# Patient Record
Sex: Male | Born: 1970 | Race: Asian | Hispanic: No | Marital: Single | Smoking: Current some day smoker
Health system: Southern US, Community
[De-identification: ages and names within clinical notes are randomized; demographics above are authoritative.]

## PROBLEM LIST (undated history)

## (undated) DIAGNOSIS — N189 Chronic kidney disease, unspecified: Secondary | ICD-10-CM

## (undated) DIAGNOSIS — D649 Anemia, unspecified: Secondary | ICD-10-CM

## (undated) DIAGNOSIS — I10 Essential (primary) hypertension: Secondary | ICD-10-CM

## (undated) DIAGNOSIS — N289 Disorder of kidney and ureter, unspecified: Secondary | ICD-10-CM

## (undated) DIAGNOSIS — E119 Type 2 diabetes mellitus without complications: Secondary | ICD-10-CM

---

## 2019-05-04 DIAGNOSIS — D3102 Benign neoplasm of left conjunctiva: Secondary | ICD-10-CM | POA: Diagnosis not present

## 2019-05-04 DIAGNOSIS — H2521 Age-related cataract, morgagnian type, right eye: Secondary | ICD-10-CM | POA: Diagnosis not present

## 2019-05-04 DIAGNOSIS — H524 Presbyopia: Secondary | ICD-10-CM | POA: Diagnosis not present

## 2019-05-04 DIAGNOSIS — H2513 Age-related nuclear cataract, bilateral: Secondary | ICD-10-CM | POA: Diagnosis not present

## 2019-05-04 DIAGNOSIS — E113413 Type 2 diabetes mellitus with severe nonproliferative diabetic retinopathy with macular edema, bilateral: Secondary | ICD-10-CM | POA: Diagnosis not present

## 2019-05-17 DIAGNOSIS — E113312 Type 2 diabetes mellitus with moderate nonproliferative diabetic retinopathy with macular edema, left eye: Secondary | ICD-10-CM | POA: Diagnosis not present

## 2019-05-17 DIAGNOSIS — H26131 Total traumatic cataract, right eye: Secondary | ICD-10-CM | POA: Diagnosis not present

## 2019-05-17 DIAGNOSIS — H3582 Retinal ischemia: Secondary | ICD-10-CM | POA: Diagnosis not present

## 2019-05-17 DIAGNOSIS — H26221 Cataract secondary to ocular disorders (degenerative) (inflammatory), right eye: Secondary | ICD-10-CM | POA: Diagnosis not present

## 2019-05-22 DIAGNOSIS — Z1331 Encounter for screening for depression: Secondary | ICD-10-CM | POA: Diagnosis not present

## 2019-05-22 DIAGNOSIS — I10 Essential (primary) hypertension: Secondary | ICD-10-CM | POA: Diagnosis not present

## 2019-05-22 DIAGNOSIS — E119 Type 2 diabetes mellitus without complications: Secondary | ICD-10-CM | POA: Diagnosis not present

## 2019-05-22 DIAGNOSIS — H269 Unspecified cataract: Secondary | ICD-10-CM | POA: Diagnosis not present

## 2019-06-06 DIAGNOSIS — I10 Essential (primary) hypertension: Secondary | ICD-10-CM | POA: Diagnosis not present

## 2019-06-06 DIAGNOSIS — E113312 Type 2 diabetes mellitus with moderate nonproliferative diabetic retinopathy with macular edema, left eye: Secondary | ICD-10-CM | POA: Diagnosis not present

## 2019-06-06 DIAGNOSIS — E119 Type 2 diabetes mellitus without complications: Secondary | ICD-10-CM | POA: Diagnosis not present

## 2019-06-25 DIAGNOSIS — R808 Other proteinuria: Secondary | ICD-10-CM | POA: Diagnosis not present

## 2019-06-25 DIAGNOSIS — E119 Type 2 diabetes mellitus without complications: Secondary | ICD-10-CM | POA: Diagnosis not present

## 2019-06-25 DIAGNOSIS — E611 Iron deficiency: Secondary | ICD-10-CM | POA: Diagnosis not present

## 2019-06-25 DIAGNOSIS — I10 Essential (primary) hypertension: Secondary | ICD-10-CM | POA: Diagnosis not present

## 2019-06-25 DIAGNOSIS — E559 Vitamin D deficiency, unspecified: Secondary | ICD-10-CM | POA: Diagnosis not present

## 2019-06-25 DIAGNOSIS — R944 Abnormal results of kidney function studies: Secondary | ICD-10-CM | POA: Diagnosis not present

## 2019-06-29 DIAGNOSIS — R944 Abnormal results of kidney function studies: Secondary | ICD-10-CM | POA: Diagnosis not present

## 2019-07-10 DIAGNOSIS — Z720 Tobacco use: Secondary | ICD-10-CM | POA: Diagnosis not present

## 2019-07-10 DIAGNOSIS — I129 Hypertensive chronic kidney disease with stage 1 through stage 4 chronic kidney disease, or unspecified chronic kidney disease: Secondary | ICD-10-CM | POA: Diagnosis not present

## 2019-07-10 DIAGNOSIS — E1122 Type 2 diabetes mellitus with diabetic chronic kidney disease: Secondary | ICD-10-CM | POA: Diagnosis not present

## 2019-07-10 DIAGNOSIS — E113312 Type 2 diabetes mellitus with moderate nonproliferative diabetic retinopathy with macular edema, left eye: Secondary | ICD-10-CM | POA: Diagnosis not present

## 2019-07-10 DIAGNOSIS — N183 Chronic kidney disease, stage 3 (moderate): Secondary | ICD-10-CM | POA: Diagnosis not present

## 2019-07-20 ENCOUNTER — Other Ambulatory Visit: Payer: Self-pay | Admitting: Nephrology

## 2019-07-20 DIAGNOSIS — N183 Chronic kidney disease, stage 3 unspecified: Secondary | ICD-10-CM

## 2019-07-26 ENCOUNTER — Telehealth: Payer: Self-pay | Admitting: Hematology

## 2019-07-26 NOTE — Telephone Encounter (Signed)
Called and LMVM for patient with date/time of NP appointment w/ Dr Zhao/ Letter/Calendar mailed

## 2019-07-27 ENCOUNTER — Other Ambulatory Visit: Payer: Self-pay

## 2019-07-30 DIAGNOSIS — I129 Hypertensive chronic kidney disease with stage 1 through stage 4 chronic kidney disease, or unspecified chronic kidney disease: Secondary | ICD-10-CM | POA: Diagnosis not present

## 2019-07-30 DIAGNOSIS — D472 Monoclonal gammopathy: Secondary | ICD-10-CM | POA: Diagnosis not present

## 2019-08-03 NOTE — Progress Notes (Addendum)
[place] NOTE  Patient Care Team: Sueanne Margarita, DO as PCP - General Rosita Fire, MD as Consulting Physician (Nephrology)  HEME/ONC OVERVIEW: 1.  IgA kappa MGUS -07/2019: M-protein 0.5 g/dL with quant IgA 769, monoclonal IgA kappa   2. Microcytic anemia -Hgb ~11 w/ MCV in the 60's; nl WBC and plts in 07/2019   PERTINENT NON-HEM/ONC PROBLEMS: 1. Stage III CKD secondary to poorly controlled Type II DM and HTN (Cr ~2.7)  ASSESSMENT & PLAN:   IgA kappa MGUS -I reviewed the patient's records in detail, including nephrology clinic notes and lab studies -In summary, patient was referred to [facility] for evaluation of Stage III CKD, likely secondary to poorly controlled Type II DM and HTN.  Labs were notable for microcytic anemia (Hgb ~11 with MCV in the 60's) and Cr of 2.7.  SPEP showed a monoclonal protein of 0.5 g/d w/ IgA kappa on IFE.  Patient was referred to hematology for further evaluation. -I reviewed the lab studies in detail with the patient -Given that he had limited studies with nephrology, I have ordered the full myeloma panel, including SPEP, IFE, free light chains, 24-hour urine protein with UPEP and UFE -In addition, I have ordered PET scan to rule out any bony disease -See the management of microcytic anemia below -Pending the results above, we will determine if bone marrow biopsy is indicated  Microcytic anemia -Hgb ~11 with MCV in the 60's in 07/2019, no prior for comparison -Hgb 10.5 today, stable  -I have ordered iron profile and hemoglobin electrophoresis -If iron panel shows iron deficiency, the patient will benefit from referral to gastroenterology to rule out occult GI bleeding  Stage III CKD -Likely secondary to Type II DM and HTN -Cr 2.5 today, stable -Continue follow-up with nephrology  Orders Placed This Encounter  Procedures  . NM PET Image Initial (PI) Whole Body    Standing Status:   Future     Standing Expiration Date:   08/08/2020    Order Specific Question:   If indicated for the ordered procedure, I authorize the administration of a radiopharmaceutical per Radiology protocol    Answer:   Yes    Order Specific Question:   Preferred imaging location?    Answer:   [facility]    Order Specific Question:   Radiology Contrast Protocol - do NOT remove file path    Answer:   [email or web address]    Order Specific Question:   Is the patient pregnant?    Answer:   No  . CBC with Differential (Cancer Center Only)    Standing Status:   Future    Standing Expiration Date:   09/12/2020  . CMP ([place] only)    Standing Status:   Future    Standing Expiration Date:   09/12/2020  . Save Smear (SSMR)    Standing Status:   Future    Standing Expiration Date:   08/08/2020   All questions were answered. The patient knows to call the clinic with any problems, questions or concerns.  Return in 1 month for labs, imaging results and clinic appt.   Tish Men, MD 08/09/2019 9:20 AM   CHIEF COMPLAINTS/PURPOSE OF CONSULTATION:  "I don't know why I am here"  HISTORY OF PRESENTING ILLNESS:  Randy Ross 48 y.o. adult is here because of incidental monoclonal IgA kappa protein.  Patient was recently referred to [facility] for evaluation of Stage III CKD, likely secondary to  poorly controlled Type II DM and HTN.  Labs were notable for microcytic anemia (Hgb ~11 with MCV in the 60's) and Cr of 2.7.  SPEP showed a monoclonal protein of 0.5 g/d w/ IgA kappa on IFE.  Patient was referred to hematology for further evaluation.  He reports that he feels well, and works as a Furniture conservator/restorer at a Scientist, research (life sciences) in [employer].  He denies any constitutional symptoms, chest pain, dyspnea, abdominal pain, nausea, vomiting, diarrhea, hematochezia, or melena.  He reports his father died of kidney failure at age 18, but he is uncertain the cause of his father's kidney  failure.  He smokes 1 pack every 2 to 3 days, he drinks occasionally.  He denies any illicit drug use.  He denies any other complaint today.  REVIEW OF SYSTEMS:   Constitutional: ( - ) fevers, ( - )  chills , ( - ) night sweats Eyes: ( - ) blurriness of vision, ( - ) double vision, ( - ) watery eyes Ears, nose, mouth, throat, and face: ( - ) mucositis, ( - ) sore throat Respiratory: ( - ) cough, ( - ) dyspnea, ( - ) wheezes Cardiovascular: ( - ) palpitation, ( - ) chest discomfort, ( - ) lower extremity swelling Gastrointestinal:  ( - ) nausea, ( - ) heartburn, ( - ) change in bowel habits Skin: ( - ) abnormal skin rashes Lymphatics: ( - ) new lymphadenopathy, ( - ) easy bruising Neurological: ( - ) numbness, ( - ) tingling, ( - ) new weaknesses Behavioral/Psych: ( - ) mood change, ( - ) new changes  All other systems were reviewed with the patient and are negative.  MEDICAL HISTORY:  History reviewed. No pertinent past medical history.  SURGICAL HISTORY: History reviewed. No pertinent surgical history.  SOCIAL HISTORY: Social History   Socioeconomic History  . Marital status: Divorced    Spouse name: Not on file  . Number of children: Not on file  . Years of education: Not on file  . Highest education level: Not on file  Occupational History  . Not on file  Social Needs  . Financial resource strain: Not on file  . Food insecurity    Worry: Not on file    Inability: Not on file  . Transportation needs    Medical: Not on file    Non-medical: Not on file  Tobacco Use  . Smoking status: Not on file  Substance and Sexual Activity  . Alcohol use: Not on file  . Drug use: Not on file  . Sexual activity: Not on file  Lifestyle  . Physical activity    Days per week: Not on file    Minutes per session: Not on file  . Stress: Not on file  Relationships  . Social Herbalist on phone: Not on file    Gets together: Not on file    Attends religious service: Not on  file    Active member of club or organization: Not on file    Attends meetings of clubs or organizations: Not on file    Relationship status: Not on file  . Intimate partner violence    Fear of current or ex partner: Not on file    Emotionally abused: Not on file    Physically abused: Not on file    Forced sexual activity: Not on file  Other Topics Concern  . Not on file  Social History Narrative  . Not  on file    FAMILY HISTORY: History reviewed. No pertinent family history.  ALLERGIES:  has no allergies on file.  MEDICATIONS:  Current Outpatient Medications  Medication Sig Dispense Refill  . chlorthalidone (HYGROTON) 25 MG tablet Take 12.5 mg by mouth daily.     No current facility-administered medications for this visit.     PHYSICAL EXAMINATION: ECOG PERFORMANCE STATUS: 0 - Asymptomatic  Vitals:   08/09/19 0854  BP: (!) 164/94  Pulse: 78  Resp: 19  Temp: (!) 97.1 F (36.2 C)  SpO2: 100%   Filed Weights   08/09/19 0854  Weight: 181 lb 1.9 oz (82.2 kg)    GENERAL: alert, no distress and comfortable SKIN: skin color, texture, turgor are normal, no rashes or significant lesions EYES: conjunctiva are pink and non-injected, sclera clear NECK: supple, non-tender LUNGS: clear to auscultation with normal breathing effort HEART: regular rate & rhythm, no murmurs, no lower extremity edema ABDOMEN: soft, non-tender, non-distended, normal bowel sounds Musculoskeletal: no cyanosis of digits and no clubbing  PSYCH: alert & oriented x 3, fluent speech NEURO: no focal motor/sensory deficits  LABORATORY DATA:  I have reviewed the data as listed Lab Results  Component Value Date   WBC 8.8 08/09/2019   HGB 10.5 (L) 08/09/2019   HCT 33.9 (L) 08/09/2019   MCV 67.1 (L) 08/09/2019   PLT 396 08/09/2019   Lab Results  Component Value Date   NA 140 08/09/2019   K 3.8 08/09/2019   CL 107 08/09/2019   CO2 26 08/09/2019    RADIOGRAPHIC STUDIES: I have personally  reviewed the radiological images as listed and agreed with the findings in the report. No results found.  PATHOLOGY: I have reviewed the pathology reports as documented in the oncologist history.

## 2019-08-06 ENCOUNTER — Other Ambulatory Visit: Payer: Self-pay | Admitting: Hematology

## 2019-08-06 DIAGNOSIS — D509 Iron deficiency anemia, unspecified: Secondary | ICD-10-CM

## 2019-08-06 DIAGNOSIS — D649 Anemia, unspecified: Secondary | ICD-10-CM | POA: Insufficient documentation

## 2019-08-06 DIAGNOSIS — D472 Monoclonal gammopathy: Secondary | ICD-10-CM

## 2019-08-09 ENCOUNTER — Encounter: Payer: Self-pay | Admitting: Hematology

## 2019-08-09 ENCOUNTER — Encounter: Payer: Self-pay | Admitting: *Deleted

## 2019-08-09 ENCOUNTER — Other Ambulatory Visit: Payer: Self-pay

## 2019-08-09 ENCOUNTER — Inpatient Hospital Stay: Payer: BC Managed Care – PPO | Attending: Hematology

## 2019-08-09 ENCOUNTER — Ambulatory Visit
Admission: RE | Admit: 2019-08-09 | Discharge: 2019-08-09 | Disposition: A | Discharge status: Home / Self Care | Payer: BC Managed Care – PPO | Source: Ambulatory Visit | Attending: Nephrology | Admitting: Nephrology

## 2019-08-09 ENCOUNTER — Inpatient Hospital Stay (HOSPITAL_BASED_OUTPATIENT_CLINIC_OR_DEPARTMENT_OTHER): Payer: BC Managed Care – PPO | Admitting: Hematology

## 2019-08-09 ENCOUNTER — Telehealth: Payer: Self-pay | Admitting: Hematology

## 2019-08-09 VITALS — BP 164/94 | HR 78 | Temp 97.1°F | Resp 19 | Ht 65.0 in | Wt 181.1 lb

## 2019-08-09 DIAGNOSIS — E1122 Type 2 diabetes mellitus with diabetic chronic kidney disease: Secondary | ICD-10-CM | POA: Insufficient documentation

## 2019-08-09 DIAGNOSIS — E1165 Type 2 diabetes mellitus with hyperglycemia: Secondary | ICD-10-CM

## 2019-08-09 DIAGNOSIS — Z720 Tobacco use: Secondary | ICD-10-CM | POA: Diagnosis not present

## 2019-08-09 DIAGNOSIS — D509 Iron deficiency anemia, unspecified: Secondary | ICD-10-CM | POA: Diagnosis not present

## 2019-08-09 DIAGNOSIS — I129 Hypertensive chronic kidney disease with stage 1 through stage 4 chronic kidney disease, or unspecified chronic kidney disease: Secondary | ICD-10-CM | POA: Diagnosis not present

## 2019-08-09 DIAGNOSIS — D472 Monoclonal gammopathy: Secondary | ICD-10-CM | POA: Diagnosis not present

## 2019-08-09 DIAGNOSIS — N183 Chronic kidney disease, stage 3 unspecified: Secondary | ICD-10-CM

## 2019-08-09 LAB — CMP (CANCER CENTER ONLY)
ALT: 13 U/L (ref 0–44)
AST: 15 U/L (ref 15–41)
Albumin: 3.7 g/dL (ref 3.5–5.0)
Alkaline Phosphatase: 74 U/L (ref 38–126)
Anion gap: 7 (ref 5–15)
BUN: 34 mg/dL — ABNORMAL HIGH (ref 6–20)
CO2: 26 mmol/L (ref 22–32)
Calcium: 9.3 mg/dL (ref 8.9–10.3)
Chloride: 107 mmol/L (ref 98–111)
Creatinine: 2.54 mg/dL — ABNORMAL HIGH (ref 0.61–1.24)
Glucose, Bld: 131 mg/dL — ABNORMAL HIGH (ref 70–99)
Potassium: 3.8 mmol/L (ref 3.5–5.1)
Sodium: 140 mmol/L (ref 135–145)
Total Bilirubin: 0.2 mg/dL — ABNORMAL LOW (ref 0.3–1.2)
Total Protein: 6.8 g/dL (ref 6.5–8.1)

## 2019-08-09 LAB — CBC WITH DIFFERENTIAL (CANCER CENTER ONLY)
Abs Immature Granulocytes: 0.02 10*3/uL (ref 0.00–0.07)
Basophils Absolute: 0.1 10*3/uL (ref 0.0–0.1)
Basophils Relative: 1 %
Eosinophils Absolute: 0.3 10*3/uL (ref 0.0–0.5)
Eosinophils Relative: 3 %
HCT: 33.9 % — ABNORMAL LOW (ref 39.0–52.0)
Hemoglobin: 10.5 g/dL — ABNORMAL LOW (ref 13.0–17.0)
Immature Granulocytes: 0 %
Lymphocytes Relative: 19 %
Lymphs Abs: 1.6 10*3/uL (ref 0.7–4.0)
MCH: 20.8 pg — ABNORMAL LOW (ref 26.0–34.0)
MCHC: 31 g/dL (ref 30.0–36.0)
MCV: 67.1 fL — ABNORMAL LOW (ref 80.0–100.0)
Monocytes Absolute: 0.6 10*3/uL (ref 0.1–1.0)
Monocytes Relative: 7 %
Neutro Abs: 6.2 10*3/uL (ref 1.7–7.7)
Neutrophils Relative %: 70 %
Platelet Count: 396 10*3/uL (ref 150–400)
RBC: 5.05 MIL/uL (ref 4.22–5.81)
RDW: 14.8 % (ref 11.5–15.5)
WBC Count: 8.8 10*3/uL (ref 4.0–10.5)
nRBC: 0 % (ref 0.0–0.2)

## 2019-08-09 LAB — IRON AND TIBC
Iron: 61 ug/dL (ref 42–163)
Saturation Ratios: 31 % (ref 20–55)
TIBC: 198 ug/dL — ABNORMAL LOW (ref 202–409)
UIBC: 137 ug/dL (ref 117–376)

## 2019-08-09 LAB — LACTATE DEHYDROGENASE: LDH: 184 U/L (ref 98–192)

## 2019-08-09 LAB — SAVE SMEAR(SSMR), FOR PROVIDER SLIDE REVIEW

## 2019-08-09 LAB — FERRITIN: Ferritin: 158 ng/mL (ref 24–336)

## 2019-08-09 NOTE — Telephone Encounter (Signed)
Appointments scheduled and patient was given calendar per 10/29 los

## 2019-08-10 LAB — KAPPA/LAMBDA LIGHT CHAINS
Kappa free light chain: 79.5 mg/L — ABNORMAL HIGH (ref 3.3–19.4)
Kappa, lambda light chain ratio: 2 — ABNORMAL HIGH (ref 0.26–1.65)
Lambda free light chains: 39.8 mg/L — ABNORMAL HIGH (ref 5.7–26.3)

## 2019-08-10 LAB — BETA 2 MICROGLOBULIN, SERUM: Beta-2 Microglobulin: 3.5 mg/L

## 2019-08-12 LAB — MULTIPLE MYELOMA PANEL, SERUM
Albumin SerPl Elph-Mcnc: 3.4 g/dL
Albumin/Glob SerPl: 1.2
Alpha 1: 0.2 g/dL
Alpha2 Glob SerPl Elph-Mcnc: 0.8 g/dL
B-Globulin SerPl Elph-Mcnc: 0.7 g/dL
Gamma Glob SerPl Elph-Mcnc: 1.3 g/dL
Globulin, Total: 3 g/dL
IgA: 661 mg/dL
IgG (Immunoglobin G), Serum: 1127 mg/dL
IgM (Immunoglobulin M), Srm: 89 mg/dL
M Protein SerPl Elph-Mcnc: 0.5 g/dL — ABNORMAL HIGH
Total Protein ELP: 6.4 g/dL (ref 6.0–8.5)

## 2019-08-13 LAB — HEMOGLOBINOPATHY EVALUATION
Hgb A2 Quant: 5 % — ABNORMAL HIGH (ref 1.8–3.2)
Hgb A: 78.8 % — ABNORMAL LOW (ref 96.4–98.8)
Hgb C: 0 %
Hgb F Quant: 0 % (ref 0.0–2.0)
Hgb S Quant: 0 %
Hgb Variant: 16.2 % — ABNORMAL HIGH

## 2019-08-16 ENCOUNTER — Telehealth: Payer: Self-pay | Admitting: Hematology

## 2019-08-16 NOTE — Telephone Encounter (Signed)
I called and left detailed message regarding PET Scan appointment date/time/location/NPO 6 hrs-only water/  I also advised patient to call Central Scheduling @ [phone] if date/time needed to be changed

## 2019-08-21 DIAGNOSIS — E113312 Type 2 diabetes mellitus with moderate nonproliferative diabetic retinopathy with macular edema, left eye: Secondary | ICD-10-CM | POA: Diagnosis not present

## 2019-08-27 ENCOUNTER — Ambulatory Visit (HOSPITAL_COMMUNITY): Payer: BC Managed Care – PPO

## 2019-08-29 DIAGNOSIS — H2521 Age-related cataract, morgagnian type, right eye: Secondary | ICD-10-CM | POA: Diagnosis not present

## 2019-09-10 ENCOUNTER — Telehealth: Payer: Self-pay | Admitting: Hematology

## 2019-09-10 NOTE — Progress Notes (Deleted)
[place] OFFICE PROGRESS NOTE  Patient Care Team: Sueanne Margarita, DO as PCP - General Rosita Fire, MD as Consulting Physician (Nephrology)  HEME/ONC OVERVIEW: 1.  IgA kappa MGUS -07/2019: incidental M-spike for CKD  -08/2019: baseline labs   Hgb 10.5 w/ MCV in the 60's, Cr 2.5, Ca 9.3  M-spike 0.5 g/dL, monoclonal IgA kappa; free kappa 79, lambda 40, K/L ratio 2.0; quant IgA 661; LDH 185, B2M 3.5   2. Hgb E trait  -Hgb variant ~17%, consistent w/ heterozygous Hgb E trait    PERTINENT NON-HEM/ONC PROBLEMS: 1. Stage III CKD secondary to poorly controlled Type II DM and HTN (Cr ~2.7)  ASSESSMENT & PLAN:   IgA kappa monoclonal gammopathy -I reviewed the lab studies in detail with the patient -Interestingly, hemoglobin electrophoresis showed heterozygous hemoglobin E trait, which can be seen in patients of [place] descent.  However, heterozygous Hgb E trait is generally not associated with significant anemia/  -In light of the IgA monoclonal gammopathy, microcytic anemia, and CKD, I recommend pursuing bone marrow biopsy to assess the percentage of of abnormal plasma cells in the bone marrow -In addition, I recommend PET scan to rule out any abnormal bony lesions -Pending work-up above, we will determine if the patient meets criteria for multiple myeloma or require treatment  Hemoglobin E trait -As discussed above, hemoglobin electrophoresis showed heterozygous hemoglobin E trait, which can be seen in patients of [place] descent. -Hgb ___today, stable; iron profile did not show any definite iron deficiency -See the work-up for IgA kappa monoclonal gammopathy above -I recommend patient to maintain up-to-date with age-appropriate cancer screening, including colonoscopy  Stage III CKD -Likely secondary to Type II DM and HTN -Cr ___ today, stable -Continue follow-up with nephrology  No orders of the defined types were placed in this  encounter.   All questions were answered. The patient knows to call the clinic with any problems, questions or concerns. No barriers to learning was detected.  A total of more than {CHL ONC TIME VISIT - [id number]} were spent face-to-face with the patient during this encounter and over half of that time was spent on counseling and coordination of care as outlined above.   Tish Men, MD 09/10/2019 12:36 PM  CHIEF COMPLAINT: "I am here for *** "  INTERVAL HISTORY:   REVIEW OF SYSTEMS:   Constitutional: ( - ) fevers, ( - )  chills , ( - ) night sweats Eyes: ( - ) blurriness of vision, ( - ) double vision, ( - ) watery eyes Ears, nose, mouth, throat, and face: ( - ) mucositis, ( - ) sore throat Respiratory: ( - ) cough, ( - ) dyspnea, ( - ) wheezes Cardiovascular: ( - ) palpitation, ( - ) chest discomfort, ( - ) lower extremity swelling Gastrointestinal:  ( - ) nausea, ( - ) heartburn, ( - ) change in bowel habits Skin: ( - ) abnormal skin rashes Lymphatics: ( - ) new lymphadenopathy, ( - ) easy bruising Neurological: ( - ) numbness, ( - ) tingling, ( - ) new weaknesses Behavioral/Psych: ( - ) mood change, ( - ) new changes  All other systems were reviewed with the patient and are negative.  SUMMARY OF ONCOLOGIC HISTORY: Oncology History   No history exists.    I have reviewed the past medical history, past surgical history, social history and family history with the patient and they are unchanged from previous note.  ALLERGIES:  has  no allergies on file.  MEDICATIONS:  Current Outpatient Medications  Medication Sig Dispense Refill  . chlorthalidone (HYGROTON) 25 MG tablet Take 12.5 mg by mouth daily.     No current facility-administered medications for this visit.     PHYSICAL EXAMINATION: ECOG PERFORMANCE STATUS: {CHL ONC ECOG [phone]}  There were no vitals filed for this visit. There is no height or weight on file to calculate BMI.  There were no vitals  filed for this visit.  GENERAL: alert, no distress and comfortable SKIN: skin color, texture, turgor are normal, no rashes or significant lesions EYES: conjunctiva are pink and non-injected, sclera clear OROPHARYNX: no exudate, no erythema; lips, buccal mucosa, and tongue normal  NECK: supple, non-tender LYMPH:  no palpable lymphadenopathy in the cervical LUNGS: clear to auscultation with normal breathing effort HEART: regular rate & rhythm and no murmurs and no lower extremity edema ABDOMEN: soft, non-tender, non-distended, normal bowel sounds Musculoskeletal: no cyanosis of digits and no clubbing  PSYCH: alert & oriented x 3, fluent speech NEURO: no focal motor/sensory deficits  LABORATORY DATA:  I have reviewed the data as listed    Component Value Date/Time   NA 140 08/09/2019 0827   K 3.8 08/09/2019 0827   CL 107 08/09/2019 0827   CO2 26 08/09/2019 0827   GLUCOSE 131 (H) 08/09/2019 0827   BUN 34 (H) 08/09/2019 0827   CREATININE 2.54 (H) 08/09/2019 0827   CALCIUM 9.3 08/09/2019 0827   PROT 6.8 08/09/2019 0827   ALBUMIN 3.7 08/09/2019 0827   AST 15 08/09/2019 0827   ALT 13 08/09/2019 0827   ALKPHOS 74 08/09/2019 0827   BILITOT 0.2 (L) 08/09/2019 0827   GFRNONAA NOT CALCULATED 08/09/2019 0827   GFRAA NOT CALCULATED 08/09/2019 0827    No results found for: SPEP, UPEP  Lab Results  Component Value Date   WBC 8.8 08/09/2019   NEUTROABS 6.2 08/09/2019   HGB 10.5 (L) 08/09/2019   HCT 33.9 (L) 08/09/2019   MCV 67.1 (L) 08/09/2019   PLT 396 08/09/2019      Chemistry      Component Value Date/Time   NA 140 08/09/2019 0827   K 3.8 08/09/2019 0827   CL 107 08/09/2019 0827   CO2 26 08/09/2019 0827   BUN 34 (H) 08/09/2019 0827   CREATININE 2.54 (H) 08/09/2019 0827      Component Value Date/Time   CALCIUM 9.3 08/09/2019 0827   ALKPHOS 74 08/09/2019 0827   AST 15 08/09/2019 0827   ALT 13 08/09/2019 0827   BILITOT 0.2 (L) 08/09/2019 0827       RADIOGRAPHIC  STUDIES: I have personally reviewed the radiological images as listed below and agreed with the findings in the report. No results found.

## 2019-09-10 NOTE — Telephone Encounter (Signed)
Patient called to cancel & reschedule his 12/1 appt.  Appointments scheduled and letter/calendar was mailed as well.

## 2019-09-11 ENCOUNTER — Other Ambulatory Visit: Payer: BC Managed Care – PPO

## 2019-09-11 ENCOUNTER — Ambulatory Visit: Payer: BC Managed Care – PPO | Admitting: Hematology

## 2019-10-12 HISTORY — PX: LUMBAR DISC SURGERY: SHX700

## 2019-10-25 ENCOUNTER — Inpatient Hospital Stay: Payer: Self-pay | Admitting: Hematology

## 2019-10-25 ENCOUNTER — Inpatient Hospital Stay: Payer: Self-pay | Attending: Hematology

## 2019-11-08 DIAGNOSIS — H268 Other specified cataract: Secondary | ICD-10-CM | POA: Diagnosis not present

## 2019-11-08 DIAGNOSIS — H2521 Age-related cataract, morgagnian type, right eye: Secondary | ICD-10-CM | POA: Diagnosis not present

## 2019-11-08 DIAGNOSIS — H25811 Combined forms of age-related cataract, right eye: Secondary | ICD-10-CM | POA: Diagnosis not present

## 2019-12-12 DIAGNOSIS — E113511 Type 2 diabetes mellitus with proliferative diabetic retinopathy with macular edema, right eye: Secondary | ICD-10-CM | POA: Diagnosis not present

## 2020-02-14 DIAGNOSIS — F17210 Nicotine dependence, cigarettes, uncomplicated: Secondary | ICD-10-CM | POA: Diagnosis not present

## 2020-02-14 DIAGNOSIS — E1165 Type 2 diabetes mellitus with hyperglycemia: Secondary | ICD-10-CM | POA: Diagnosis not present

## 2020-02-14 DIAGNOSIS — I10 Essential (primary) hypertension: Secondary | ICD-10-CM | POA: Diagnosis not present

## 2020-02-14 DIAGNOSIS — R202 Paresthesia of skin: Secondary | ICD-10-CM | POA: Diagnosis not present

## 2020-02-14 DIAGNOSIS — R200 Anesthesia of skin: Secondary | ICD-10-CM | POA: Diagnosis not present

## 2020-02-15 DIAGNOSIS — E1122 Type 2 diabetes mellitus with diabetic chronic kidney disease: Secondary | ICD-10-CM | POA: Diagnosis not present

## 2020-02-15 DIAGNOSIS — R42 Dizziness and giddiness: Secondary | ICD-10-CM | POA: Diagnosis not present

## 2020-02-15 DIAGNOSIS — G935 Compression of brain: Secondary | ICD-10-CM | POA: Diagnosis not present

## 2020-02-15 DIAGNOSIS — R29703 NIHSS score 3: Secondary | ICD-10-CM | POA: Diagnosis not present

## 2020-02-15 DIAGNOSIS — I129 Hypertensive chronic kidney disease with stage 1 through stage 4 chronic kidney disease, or unspecified chronic kidney disease: Secondary | ICD-10-CM | POA: Diagnosis not present

## 2020-02-15 DIAGNOSIS — E785 Hyperlipidemia, unspecified: Secondary | ICD-10-CM | POA: Diagnosis not present

## 2020-02-15 DIAGNOSIS — I517 Cardiomegaly: Secondary | ICD-10-CM | POA: Diagnosis not present

## 2020-02-15 DIAGNOSIS — D472 Monoclonal gammopathy: Secondary | ICD-10-CM | POA: Diagnosis not present

## 2020-02-15 DIAGNOSIS — I639 Cerebral infarction, unspecified: Secondary | ICD-10-CM | POA: Diagnosis not present

## 2020-02-15 DIAGNOSIS — R200 Anesthesia of skin: Secondary | ICD-10-CM | POA: Diagnosis not present

## 2020-02-15 DIAGNOSIS — I6389 Other cerebral infarction: Secondary | ICD-10-CM | POA: Diagnosis not present

## 2020-02-15 DIAGNOSIS — D72829 Elevated white blood cell count, unspecified: Secondary | ICD-10-CM | POA: Diagnosis not present

## 2020-02-15 DIAGNOSIS — D509 Iron deficiency anemia, unspecified: Secondary | ICD-10-CM | POA: Diagnosis not present

## 2020-02-15 DIAGNOSIS — I161 Hypertensive emergency: Secondary | ICD-10-CM | POA: Diagnosis not present

## 2020-02-15 DIAGNOSIS — F1590 Other stimulant use, unspecified, uncomplicated: Secondary | ICD-10-CM | POA: Diagnosis not present

## 2020-02-15 DIAGNOSIS — N179 Acute kidney failure, unspecified: Secondary | ICD-10-CM | POA: Diagnosis not present

## 2020-02-15 DIAGNOSIS — N1832 Chronic kidney disease, stage 3b: Secondary | ICD-10-CM | POA: Diagnosis not present

## 2020-02-15 DIAGNOSIS — R05 Cough: Secondary | ICD-10-CM | POA: Diagnosis not present

## 2020-02-15 DIAGNOSIS — I619 Nontraumatic intracerebral hemorrhage, unspecified: Secondary | ICD-10-CM | POA: Diagnosis not present

## 2020-02-15 DIAGNOSIS — G936 Cerebral edema: Secondary | ICD-10-CM | POA: Diagnosis not present

## 2020-02-15 DIAGNOSIS — E871 Hypo-osmolality and hyponatremia: Secondary | ICD-10-CM | POA: Diagnosis not present

## 2020-02-15 DIAGNOSIS — E1165 Type 2 diabetes mellitus with hyperglycemia: Secondary | ICD-10-CM | POA: Diagnosis not present

## 2020-02-15 DIAGNOSIS — F17200 Nicotine dependence, unspecified, uncomplicated: Secondary | ICD-10-CM | POA: Diagnosis not present

## 2020-02-15 DIAGNOSIS — I610 Nontraumatic intracerebral hemorrhage in hemisphere, subcortical: Secondary | ICD-10-CM | POA: Diagnosis not present

## 2020-02-16 DIAGNOSIS — I517 Cardiomegaly: Secondary | ICD-10-CM | POA: Diagnosis not present

## 2020-02-16 DIAGNOSIS — I610 Nontraumatic intracerebral hemorrhage in hemisphere, subcortical: Secondary | ICD-10-CM | POA: Diagnosis not present

## 2020-02-16 DIAGNOSIS — I619 Nontraumatic intracerebral hemorrhage, unspecified: Secondary | ICD-10-CM | POA: Diagnosis not present

## 2020-02-17 DIAGNOSIS — I610 Nontraumatic intracerebral hemorrhage in hemisphere, subcortical: Secondary | ICD-10-CM | POA: Diagnosis not present

## 2020-02-17 DIAGNOSIS — R05 Cough: Secondary | ICD-10-CM | POA: Diagnosis not present

## 2020-02-19 DIAGNOSIS — I619 Nontraumatic intracerebral hemorrhage, unspecified: Secondary | ICD-10-CM | POA: Diagnosis not present

## 2020-02-19 DIAGNOSIS — E1129 Type 2 diabetes mellitus with other diabetic kidney complication: Secondary | ICD-10-CM | POA: Diagnosis not present

## 2020-02-19 DIAGNOSIS — E1165 Type 2 diabetes mellitus with hyperglycemia: Secondary | ICD-10-CM | POA: Diagnosis not present

## 2020-02-19 DIAGNOSIS — N184 Chronic kidney disease, stage 4 (severe): Secondary | ICD-10-CM | POA: Diagnosis not present

## 2020-02-19 DIAGNOSIS — Z09 Encounter for follow-up examination after completed treatment for conditions other than malignant neoplasm: Secondary | ICD-10-CM | POA: Diagnosis not present

## 2020-02-19 DIAGNOSIS — I129 Hypertensive chronic kidney disease with stage 1 through stage 4 chronic kidney disease, or unspecified chronic kidney disease: Secondary | ICD-10-CM | POA: Diagnosis not present

## 2020-02-19 DIAGNOSIS — I10 Essential (primary) hypertension: Secondary | ICD-10-CM | POA: Diagnosis not present

## 2020-02-25 DIAGNOSIS — Z79899 Other long term (current) drug therapy: Secondary | ICD-10-CM | POA: Diagnosis not present

## 2020-02-25 DIAGNOSIS — F17210 Nicotine dependence, cigarettes, uncomplicated: Secondary | ICD-10-CM | POA: Diagnosis not present

## 2020-02-25 DIAGNOSIS — E119 Type 2 diabetes mellitus without complications: Secondary | ICD-10-CM | POA: Diagnosis not present

## 2020-02-25 DIAGNOSIS — Z794 Long term (current) use of insulin: Secondary | ICD-10-CM | POA: Diagnosis not present

## 2020-02-25 DIAGNOSIS — I69398 Other sequelae of cerebral infarction: Secondary | ICD-10-CM | POA: Diagnosis not present

## 2020-02-25 DIAGNOSIS — I10 Essential (primary) hypertension: Secondary | ICD-10-CM | POA: Diagnosis not present

## 2020-02-25 DIAGNOSIS — R519 Headache, unspecified: Secondary | ICD-10-CM | POA: Diagnosis not present

## 2020-02-26 DIAGNOSIS — I610 Nontraumatic intracerebral hemorrhage in hemisphere, subcortical: Secondary | ICD-10-CM | POA: Diagnosis not present

## 2020-02-27 DIAGNOSIS — N184 Chronic kidney disease, stage 4 (severe): Secondary | ICD-10-CM | POA: Diagnosis not present

## 2020-02-27 DIAGNOSIS — I129 Hypertensive chronic kidney disease with stage 1 through stage 4 chronic kidney disease, or unspecified chronic kidney disease: Secondary | ICD-10-CM | POA: Diagnosis not present

## 2020-03-02 DIAGNOSIS — S161XXA Strain of muscle, fascia and tendon at neck level, initial encounter: Secondary | ICD-10-CM | POA: Diagnosis not present

## 2020-03-02 DIAGNOSIS — G8911 Acute pain due to trauma: Secondary | ICD-10-CM | POA: Diagnosis not present

## 2020-03-02 DIAGNOSIS — E119 Type 2 diabetes mellitus without complications: Secondary | ICD-10-CM | POA: Diagnosis not present

## 2020-03-02 DIAGNOSIS — M545 Low back pain: Secondary | ICD-10-CM | POA: Diagnosis not present

## 2020-03-02 DIAGNOSIS — M542 Cervicalgia: Secondary | ICD-10-CM | POA: Diagnosis not present

## 2020-03-02 DIAGNOSIS — R519 Headache, unspecified: Secondary | ICD-10-CM | POA: Diagnosis not present

## 2020-03-02 DIAGNOSIS — I10 Essential (primary) hypertension: Secondary | ICD-10-CM | POA: Diagnosis not present

## 2020-03-02 DIAGNOSIS — F17200 Nicotine dependence, unspecified, uncomplicated: Secondary | ICD-10-CM | POA: Diagnosis not present

## 2020-03-02 DIAGNOSIS — D473 Essential (hemorrhagic) thrombocythemia: Secondary | ICD-10-CM | POA: Diagnosis not present

## 2020-03-02 DIAGNOSIS — Z794 Long term (current) use of insulin: Secondary | ICD-10-CM | POA: Diagnosis not present

## 2020-03-02 DIAGNOSIS — Z8673 Personal history of transient ischemic attack (TIA), and cerebral infarction without residual deficits: Secondary | ICD-10-CM | POA: Diagnosis not present

## 2020-03-02 DIAGNOSIS — S39012A Strain of muscle, fascia and tendon of lower back, initial encounter: Secondary | ICD-10-CM | POA: Diagnosis not present

## 2020-03-02 DIAGNOSIS — S199XXA Unspecified injury of neck, initial encounter: Secondary | ICD-10-CM | POA: Diagnosis not present

## 2020-03-02 DIAGNOSIS — Z79899 Other long term (current) drug therapy: Secondary | ICD-10-CM | POA: Diagnosis not present

## 2020-03-05 DIAGNOSIS — G8929 Other chronic pain: Secondary | ICD-10-CM | POA: Diagnosis not present

## 2020-03-05 DIAGNOSIS — M62838 Other muscle spasm: Secondary | ICD-10-CM | POA: Diagnosis not present

## 2020-03-05 DIAGNOSIS — M5442 Lumbago with sciatica, left side: Secondary | ICD-10-CM | POA: Diagnosis not present

## 2020-03-05 DIAGNOSIS — N184 Chronic kidney disease, stage 4 (severe): Secondary | ICD-10-CM | POA: Diagnosis not present

## 2020-03-05 DIAGNOSIS — I10 Essential (primary) hypertension: Secondary | ICD-10-CM | POA: Diagnosis not present

## 2020-03-06 DIAGNOSIS — E1122 Type 2 diabetes mellitus with diabetic chronic kidney disease: Secondary | ICD-10-CM | POA: Diagnosis not present

## 2020-03-06 DIAGNOSIS — D472 Monoclonal gammopathy: Secondary | ICD-10-CM | POA: Diagnosis not present

## 2020-03-06 DIAGNOSIS — Z794 Long term (current) use of insulin: Secondary | ICD-10-CM | POA: Diagnosis not present

## 2020-03-06 DIAGNOSIS — N189 Chronic kidney disease, unspecified: Secondary | ICD-10-CM | POA: Diagnosis not present

## 2020-03-13 DIAGNOSIS — M5417 Radiculopathy, lumbosacral region: Secondary | ICD-10-CM | POA: Diagnosis not present

## 2020-03-13 DIAGNOSIS — N289 Disorder of kidney and ureter, unspecified: Secondary | ICD-10-CM | POA: Diagnosis not present

## 2020-03-13 DIAGNOSIS — Z5181 Encounter for therapeutic drug level monitoring: Secondary | ICD-10-CM | POA: Diagnosis not present

## 2020-03-13 DIAGNOSIS — Z79899 Other long term (current) drug therapy: Secondary | ICD-10-CM | POA: Diagnosis not present

## 2020-04-09 DIAGNOSIS — M5417 Radiculopathy, lumbosacral region: Secondary | ICD-10-CM | POA: Diagnosis not present

## 2020-04-09 DIAGNOSIS — M4186 Other forms of scoliosis, lumbar region: Secondary | ICD-10-CM | POA: Diagnosis not present

## 2020-04-09 DIAGNOSIS — G894 Chronic pain syndrome: Secondary | ICD-10-CM | POA: Diagnosis not present

## 2020-04-21 DIAGNOSIS — M545 Low back pain: Secondary | ICD-10-CM | POA: Diagnosis not present

## 2020-04-25 DIAGNOSIS — I129 Hypertensive chronic kidney disease with stage 1 through stage 4 chronic kidney disease, or unspecified chronic kidney disease: Secondary | ICD-10-CM | POA: Diagnosis not present

## 2020-04-25 DIAGNOSIS — E1122 Type 2 diabetes mellitus with diabetic chronic kidney disease: Secondary | ICD-10-CM | POA: Diagnosis not present

## 2020-04-25 DIAGNOSIS — N189 Chronic kidney disease, unspecified: Secondary | ICD-10-CM | POA: Diagnosis not present

## 2020-04-25 DIAGNOSIS — I160 Hypertensive urgency: Secondary | ICD-10-CM | POA: Diagnosis not present

## 2020-04-25 DIAGNOSIS — D472 Monoclonal gammopathy: Secondary | ICD-10-CM | POA: Diagnosis not present

## 2020-04-29 DIAGNOSIS — M545 Low back pain: Secondary | ICD-10-CM | POA: Diagnosis not present

## 2020-05-01 DIAGNOSIS — M545 Low back pain: Secondary | ICD-10-CM | POA: Diagnosis not present

## 2020-05-06 DIAGNOSIS — M545 Low back pain: Secondary | ICD-10-CM | POA: Diagnosis not present

## 2020-05-07 DIAGNOSIS — M5417 Radiculopathy, lumbosacral region: Secondary | ICD-10-CM | POA: Diagnosis not present

## 2020-05-07 DIAGNOSIS — G894 Chronic pain syndrome: Secondary | ICD-10-CM | POA: Diagnosis not present

## 2020-05-11 DIAGNOSIS — Z20822 Contact with and (suspected) exposure to covid-19: Secondary | ICD-10-CM | POA: Diagnosis not present

## 2020-05-15 DIAGNOSIS — R29898 Other symptoms and signs involving the musculoskeletal system: Secondary | ICD-10-CM | POA: Diagnosis not present

## 2020-05-15 DIAGNOSIS — M5416 Radiculopathy, lumbar region: Secondary | ICD-10-CM | POA: Diagnosis not present

## 2020-05-19 DIAGNOSIS — I129 Hypertensive chronic kidney disease with stage 1 through stage 4 chronic kidney disease, or unspecified chronic kidney disease: Secondary | ICD-10-CM | POA: Diagnosis not present

## 2020-05-19 DIAGNOSIS — E1165 Type 2 diabetes mellitus with hyperglycemia: Secondary | ICD-10-CM | POA: Diagnosis not present

## 2020-05-19 DIAGNOSIS — E1129 Type 2 diabetes mellitus with other diabetic kidney complication: Secondary | ICD-10-CM | POA: Diagnosis not present

## 2020-05-19 DIAGNOSIS — D472 Monoclonal gammopathy: Secondary | ICD-10-CM | POA: Diagnosis not present

## 2020-05-19 DIAGNOSIS — N184 Chronic kidney disease, stage 4 (severe): Secondary | ICD-10-CM | POA: Diagnosis not present

## 2020-05-19 DIAGNOSIS — I10 Essential (primary) hypertension: Secondary | ICD-10-CM | POA: Diagnosis not present

## 2020-05-20 DIAGNOSIS — R29898 Other symptoms and signs involving the musculoskeletal system: Secondary | ICD-10-CM | POA: Diagnosis not present

## 2020-05-20 DIAGNOSIS — M5416 Radiculopathy, lumbar region: Secondary | ICD-10-CM | POA: Diagnosis not present

## 2020-05-23 DIAGNOSIS — M4727 Other spondylosis with radiculopathy, lumbosacral region: Secondary | ICD-10-CM | POA: Diagnosis not present

## 2020-05-23 DIAGNOSIS — M47817 Spondylosis without myelopathy or radiculopathy, lumbosacral region: Secondary | ICD-10-CM | POA: Diagnosis not present

## 2020-05-23 DIAGNOSIS — M5117 Intervertebral disc disorders with radiculopathy, lumbosacral region: Secondary | ICD-10-CM | POA: Diagnosis not present

## 2020-05-23 DIAGNOSIS — M5127 Other intervertebral disc displacement, lumbosacral region: Secondary | ICD-10-CM | POA: Diagnosis not present

## 2020-05-23 DIAGNOSIS — M4726 Other spondylosis with radiculopathy, lumbar region: Secondary | ICD-10-CM | POA: Diagnosis not present

## 2020-05-23 DIAGNOSIS — M4807 Spinal stenosis, lumbosacral region: Secondary | ICD-10-CM | POA: Diagnosis not present

## 2020-05-23 DIAGNOSIS — M5126 Other intervertebral disc displacement, lumbar region: Secondary | ICD-10-CM | POA: Diagnosis not present

## 2020-06-04 DIAGNOSIS — M5417 Radiculopathy, lumbosacral region: Secondary | ICD-10-CM | POA: Diagnosis not present

## 2020-06-04 DIAGNOSIS — G894 Chronic pain syndrome: Secondary | ICD-10-CM | POA: Diagnosis not present

## 2020-06-23 DIAGNOSIS — I129 Hypertensive chronic kidney disease with stage 1 through stage 4 chronic kidney disease, or unspecified chronic kidney disease: Secondary | ICD-10-CM | POA: Diagnosis not present

## 2020-06-23 DIAGNOSIS — I10 Essential (primary) hypertension: Secondary | ICD-10-CM | POA: Diagnosis not present

## 2020-06-23 DIAGNOSIS — Z0000 Encounter for general adult medical examination without abnormal findings: Secondary | ICD-10-CM | POA: Diagnosis not present

## 2020-06-23 DIAGNOSIS — E1129 Type 2 diabetes mellitus with other diabetic kidney complication: Secondary | ICD-10-CM | POA: Diagnosis not present

## 2020-06-23 DIAGNOSIS — Z6829 Body mass index (BMI) 29.0-29.9, adult: Secondary | ICD-10-CM | POA: Diagnosis not present

## 2020-06-23 DIAGNOSIS — Z1211 Encounter for screening for malignant neoplasm of colon: Secondary | ICD-10-CM | POA: Diagnosis not present

## 2020-06-23 DIAGNOSIS — E1165 Type 2 diabetes mellitus with hyperglycemia: Secondary | ICD-10-CM | POA: Diagnosis not present

## 2020-06-23 DIAGNOSIS — E782 Mixed hyperlipidemia: Secondary | ICD-10-CM | POA: Diagnosis not present

## 2020-07-02 DIAGNOSIS — I10 Essential (primary) hypertension: Secondary | ICD-10-CM | POA: Diagnosis not present

## 2020-07-02 DIAGNOSIS — Z5181 Encounter for therapeutic drug level monitoring: Secondary | ICD-10-CM | POA: Diagnosis not present

## 2020-07-02 DIAGNOSIS — G894 Chronic pain syndrome: Secondary | ICD-10-CM | POA: Diagnosis not present

## 2020-07-02 DIAGNOSIS — Z79899 Other long term (current) drug therapy: Secondary | ICD-10-CM | POA: Diagnosis not present

## 2020-07-02 DIAGNOSIS — M5417 Radiculopathy, lumbosacral region: Secondary | ICD-10-CM | POA: Diagnosis not present

## 2020-08-15 DIAGNOSIS — M5417 Radiculopathy, lumbosacral region: Secondary | ICD-10-CM | POA: Diagnosis not present

## 2020-08-20 DIAGNOSIS — E1129 Type 2 diabetes mellitus with other diabetic kidney complication: Secondary | ICD-10-CM | POA: Diagnosis not present

## 2020-08-20 DIAGNOSIS — I10 Essential (primary) hypertension: Secondary | ICD-10-CM | POA: Diagnosis not present

## 2020-08-20 DIAGNOSIS — E1165 Type 2 diabetes mellitus with hyperglycemia: Secondary | ICD-10-CM | POA: Diagnosis not present

## 2020-08-20 DIAGNOSIS — N184 Chronic kidney disease, stage 4 (severe): Secondary | ICD-10-CM | POA: Diagnosis not present

## 2020-09-09 DIAGNOSIS — J029 Acute pharyngitis, unspecified: Secondary | ICD-10-CM | POA: Diagnosis not present

## 2020-09-09 DIAGNOSIS — R197 Diarrhea, unspecified: Secondary | ICD-10-CM | POA: Diagnosis not present

## 2020-09-09 DIAGNOSIS — R059 Cough, unspecified: Secondary | ICD-10-CM | POA: Diagnosis not present

## 2021-08-25 ENCOUNTER — Telehealth (HOSPITAL_COMMUNITY): Payer: Self-pay

## 2021-08-25 ENCOUNTER — Other Ambulatory Visit (HOSPITAL_COMMUNITY): Payer: Self-pay | Admitting: Nephrology

## 2021-08-25 DIAGNOSIS — N185 Chronic kidney disease, stage 5: Secondary | ICD-10-CM

## 2021-08-25 NOTE — Telephone Encounter (Signed)
Called to schedule tunneled cath placement, no answer, left vm. AW

## 2021-08-26 ENCOUNTER — Other Ambulatory Visit: Payer: Self-pay

## 2021-08-26 NOTE — Progress Notes (Signed)
Error

## 2021-08-27 ENCOUNTER — Other Ambulatory Visit: Payer: Self-pay | Admitting: Radiology

## 2021-08-28 ENCOUNTER — Ambulatory Visit (HOSPITAL_COMMUNITY): Admission: RE | Admit: 2021-08-28 | Payer: Self-pay | Source: Ambulatory Visit

## 2021-08-28 ENCOUNTER — Emergency Department (HOSPITAL_COMMUNITY): Admission: EM | Admit: 2021-08-28 | Payer: Self-pay | Source: Home / Self Care

## 2021-08-28 ENCOUNTER — Other Ambulatory Visit (HOSPITAL_COMMUNITY): Payer: Self-pay | Admitting: Physician Assistant

## 2021-08-31 ENCOUNTER — Encounter (HOSPITAL_COMMUNITY): Payer: Self-pay

## 2021-08-31 ENCOUNTER — Ambulatory Visit (HOSPITAL_COMMUNITY)
Admission: RE | Admit: 2021-08-31 | Discharge: 2021-08-31 | Disposition: A | Discharge status: Home / Self Care | Payer: Self-pay | Source: Ambulatory Visit | Attending: Nephrology | Admitting: Nephrology

## 2021-08-31 ENCOUNTER — Other Ambulatory Visit: Payer: Self-pay

## 2021-08-31 DIAGNOSIS — N185 Chronic kidney disease, stage 5: Secondary | ICD-10-CM

## 2021-09-17 ENCOUNTER — Other Ambulatory Visit: Payer: Self-pay

## 2021-09-17 DIAGNOSIS — N189 Chronic kidney disease, unspecified: Secondary | ICD-10-CM

## 2021-10-06 ENCOUNTER — Encounter: Payer: Self-pay | Admitting: Vascular Surgery

## 2021-10-06 ENCOUNTER — Ambulatory Visit (HOSPITAL_COMMUNITY): Payer: Self-pay

## 2021-10-27 ENCOUNTER — Encounter: Payer: Self-pay | Admitting: Vascular Surgery

## 2021-10-27 ENCOUNTER — Other Ambulatory Visit (HOSPITAL_COMMUNITY): Payer: Self-pay

## 2021-10-27 ENCOUNTER — Encounter (HOSPITAL_COMMUNITY): Payer: Self-pay

## 2021-12-08 ENCOUNTER — Inpatient Hospital Stay (HOSPITAL_COMMUNITY)
Admission: EM | Admit: 2021-12-08 | Discharge: 2021-12-15 | DRG: 674 | Disposition: A | Discharge status: Home / Self Care | Payer: Medicaid Other | Attending: Internal Medicine | Admitting: Internal Medicine

## 2021-12-08 ENCOUNTER — Encounter (HOSPITAL_COMMUNITY): Payer: Self-pay | Admitting: Emergency Medicine

## 2021-12-08 ENCOUNTER — Emergency Department (HOSPITAL_COMMUNITY): Payer: Medicaid Other

## 2021-12-08 DIAGNOSIS — E1140 Type 2 diabetes mellitus with diabetic neuropathy, unspecified: Secondary | ICD-10-CM | POA: Diagnosis present

## 2021-12-08 DIAGNOSIS — Z6830 Body mass index (BMI) 30.0-30.9, adult: Secondary | ICD-10-CM

## 2021-12-08 DIAGNOSIS — E1122 Type 2 diabetes mellitus with diabetic chronic kidney disease: Secondary | ICD-10-CM | POA: Diagnosis present

## 2021-12-08 DIAGNOSIS — E669 Obesity, unspecified: Secondary | ICD-10-CM | POA: Diagnosis present

## 2021-12-08 DIAGNOSIS — E119 Type 2 diabetes mellitus without complications: Secondary | ICD-10-CM

## 2021-12-08 DIAGNOSIS — F17210 Nicotine dependence, cigarettes, uncomplicated: Secondary | ICD-10-CM | POA: Diagnosis present

## 2021-12-08 DIAGNOSIS — I120 Hypertensive chronic kidney disease with stage 5 chronic kidney disease or end stage renal disease: Secondary | ICD-10-CM | POA: Diagnosis present

## 2021-12-08 DIAGNOSIS — N2581 Secondary hyperparathyroidism of renal origin: Secondary | ICD-10-CM | POA: Diagnosis present

## 2021-12-08 DIAGNOSIS — Z79899 Other long term (current) drug therapy: Secondary | ICD-10-CM

## 2021-12-08 DIAGNOSIS — D631 Anemia in chronic kidney disease: Secondary | ICD-10-CM | POA: Diagnosis present

## 2021-12-08 DIAGNOSIS — E872 Acidosis, unspecified: Secondary | ICD-10-CM | POA: Diagnosis present

## 2021-12-08 DIAGNOSIS — Z20822 Contact with and (suspected) exposure to covid-19: Secondary | ICD-10-CM | POA: Diagnosis present

## 2021-12-08 DIAGNOSIS — Z992 Dependence on renal dialysis: Secondary | ICD-10-CM | POA: Diagnosis not present

## 2021-12-08 DIAGNOSIS — D509 Iron deficiency anemia, unspecified: Secondary | ICD-10-CM | POA: Diagnosis present

## 2021-12-08 DIAGNOSIS — Z794 Long term (current) use of insulin: Secondary | ICD-10-CM

## 2021-12-08 DIAGNOSIS — R0602 Shortness of breath: Secondary | ICD-10-CM | POA: Diagnosis present

## 2021-12-08 DIAGNOSIS — E8770 Fluid overload, unspecified: Secondary | ICD-10-CM | POA: Diagnosis present

## 2021-12-08 DIAGNOSIS — N184 Chronic kidney disease, stage 4 (severe): Secondary | ICD-10-CM

## 2021-12-08 DIAGNOSIS — E1165 Type 2 diabetes mellitus with hyperglycemia: Secondary | ICD-10-CM | POA: Diagnosis present

## 2021-12-08 DIAGNOSIS — N179 Acute kidney failure, unspecified: Secondary | ICD-10-CM | POA: Diagnosis present

## 2021-12-08 DIAGNOSIS — I10 Essential (primary) hypertension: Secondary | ICD-10-CM

## 2021-12-08 DIAGNOSIS — Z888 Allergy status to other drugs, medicaments and biological substances status: Secondary | ICD-10-CM | POA: Diagnosis not present

## 2021-12-08 DIAGNOSIS — D472 Monoclonal gammopathy: Secondary | ICD-10-CM | POA: Diagnosis present

## 2021-12-08 DIAGNOSIS — N186 End stage renal disease: Secondary | ICD-10-CM | POA: Diagnosis present

## 2021-12-08 DIAGNOSIS — E8809 Other disorders of plasma-protein metabolism, not elsewhere classified: Secondary | ICD-10-CM | POA: Diagnosis present

## 2021-12-08 HISTORY — DX: Chronic kidney disease, unspecified: N18.9

## 2021-12-08 HISTORY — DX: Type 2 diabetes mellitus without complications: E11.9

## 2021-12-08 HISTORY — DX: Anemia, unspecified: D64.9

## 2021-12-08 HISTORY — DX: Essential (primary) hypertension: I10

## 2021-12-08 LAB — CBC WITH DIFFERENTIAL/PLATELET
Abs Immature Granulocytes: 0.05 10*3/uL (ref 0.00–0.07)
Basophils Absolute: 0 10*3/uL (ref 0.0–0.1)
Basophils Relative: 0 %
Eosinophils Absolute: 0.4 10*3/uL (ref 0.0–0.5)
Eosinophils Relative: 4 %
HCT: 21.1 % — ABNORMAL LOW (ref 39.0–52.0)
Hemoglobin: 7.1 g/dL — ABNORMAL LOW (ref 13.0–17.0)
Immature Granulocytes: 0 %
Lymphocytes Relative: 11 %
Lymphs Abs: 1.3 10*3/uL (ref 0.7–4.0)
MCH: 20.7 pg — ABNORMAL LOW (ref 26.0–34.0)
MCHC: 33.6 g/dL (ref 30.0–36.0)
MCV: 61.5 fL — ABNORMAL LOW (ref 80.0–100.0)
Monocytes Absolute: 0.7 10*3/uL (ref 0.1–1.0)
Monocytes Relative: 6 %
Neutro Abs: 9.8 10*3/uL — ABNORMAL HIGH (ref 1.7–7.7)
Neutrophils Relative %: 79 %
Platelets: 330 10*3/uL (ref 150–400)
RBC: 3.43 MIL/uL — ABNORMAL LOW (ref 4.22–5.81)
RDW: 21.2 % — ABNORMAL HIGH (ref 11.5–15.5)
Smear Review: NORMAL
WBC: 12.4 10*3/uL — ABNORMAL HIGH (ref 4.0–10.5)
nRBC: 0 % (ref 0.0–0.2)

## 2021-12-08 LAB — URINALYSIS, ROUTINE W REFLEX MICROSCOPIC
Bilirubin Urine: NEGATIVE
Glucose, UA: 150 mg/dL — AB
Hgb urine dipstick: NEGATIVE
Ketones, ur: NEGATIVE mg/dL
Leukocytes,Ua: NEGATIVE
Nitrite: NEGATIVE
Protein, ur: >=300 mg/dL — AB
Specific Gravity, Urine: 1.015 (ref 1.005–1.030)
pH: 5 (ref 5.0–8.0)

## 2021-12-08 LAB — COMPREHENSIVE METABOLIC PANEL
ALT: 25 U/L (ref 0–44)
AST: 15 U/L (ref 15–41)
Albumin: 2.5 g/dL — ABNORMAL LOW (ref 3.5–5.0)
Alkaline Phosphatase: 61 U/L (ref 38–126)
Anion gap: 12 (ref 5–15)
BUN: 106 mg/dL — ABNORMAL HIGH (ref 6–20)
CO2: 18 mmol/L — ABNORMAL LOW (ref 22–32)
Calcium: 6.3 mg/dL — CL (ref 8.9–10.3)
Chloride: 108 mmol/L (ref 98–111)
Creatinine, Ser: 16.51 mg/dL — ABNORMAL HIGH (ref 0.61–1.24)
GFR, Estimated: 3 mL/min — ABNORMAL LOW (ref >60)
Glucose, Bld: 109 mg/dL — ABNORMAL HIGH (ref 70–99)
Potassium: 4.2 mmol/L (ref 3.5–5.1)
Sodium: 138 mmol/L (ref 135–145)
Total Bilirubin: 0.3 mg/dL (ref 0.3–1.2)
Total Protein: 6 g/dL — ABNORMAL LOW (ref 6.5–8.1)

## 2021-12-08 LAB — IRON AND TIBC
Iron: 72 ug/dL (ref 45–182)
Saturation Ratios: 35 % (ref 17.9–39.5)
TIBC: 206 ug/dL — ABNORMAL LOW (ref 250–450)
UIBC: 134 ug/dL

## 2021-12-08 LAB — HIV ANTIBODY (ROUTINE TESTING W REFLEX): HIV Screen 4th Generation wRfx: NONREACTIVE

## 2021-12-08 LAB — I-STAT CHEM 8, ED
BUN: 113 mg/dL — ABNORMAL HIGH (ref 6–20)
Calcium, Ion: 0.85 mmol/L — CL (ref 1.15–1.40)
Chloride: 107 mmol/L (ref 98–111)
Creatinine, Ser: >18 mg/dL — ABNORMAL HIGH (ref 0.61–1.24)
Glucose, Bld: 145 mg/dL — ABNORMAL HIGH (ref 70–99)
HCT: 22 % — ABNORMAL LOW (ref 39.0–52.0)
Hemoglobin: 7.5 g/dL — ABNORMAL LOW (ref 13.0–17.0)
Potassium: 4.2 mmol/L (ref 3.5–5.1)
Sodium: 138 mmol/L (ref 135–145)
TCO2: 18 mmol/L — ABNORMAL LOW (ref 22–32)

## 2021-12-08 LAB — HEMOGLOBIN A1C
Hgb A1c MFr Bld: 5.9 % — ABNORMAL HIGH (ref 4.8–5.6)
Mean Plasma Glucose: 122.63 mg/dL

## 2021-12-08 LAB — RETICULOCYTES
Immature Retic Fract: 17.6 % — ABNORMAL HIGH (ref 2.3–15.9)
RBC.: 3.71 MIL/uL — ABNORMAL LOW (ref 4.22–5.81)
Retic Count, Absolute: 44.1 10*3/uL (ref 19.0–186.0)
Retic Ct Pct: 1.2 % (ref 0.4–3.1)

## 2021-12-08 LAB — LIPASE, BLOOD: Lipase: 118 U/L — ABNORMAL HIGH (ref 11–51)

## 2021-12-08 MED ORDER — ISOSORBIDE MONONITRATE ER 30 MG PO TB24
30.0000 mg | ORAL_TABLET | Freq: Every day | ORAL | Status: DC
  Administered 2021-12-09 – 2021-12-15 (×7): 30 mg via ORAL
  Filled 2021-12-08 (×7): qty 1

## 2021-12-08 MED ORDER — VITAMIN D 25 MCG (1000 UNIT) PO TABS
1000.0000 [IU] | ORAL_TABLET | Freq: Every day | ORAL | Status: DC
  Administered 2021-12-09 – 2021-12-15 (×6): 1000 [IU] via ORAL
  Filled 2021-12-08 (×7): qty 1

## 2021-12-08 MED ORDER — INSULIN GLARGINE (1 UNIT DIAL) 300 UNIT/ML ~~LOC~~ SOPN: 12.0000 [IU] | PEN_INJECTOR | SUBCUTANEOUS | Status: DC

## 2021-12-08 MED ORDER — CALCIUM GLUCONATE-NACL 1-0.675 GM/50ML-% IV SOLN
1.0000 g | Freq: Once | INTRAVENOUS | Status: AC
  Administered 2021-12-08: 1000 mg via INTRAVENOUS
  Filled 2021-12-08: qty 50

## 2021-12-08 MED ORDER — INSULIN ASPART 100 UNIT/ML IJ SOLN
0.0000 [IU] | Freq: Three times a day (TID) | INTRAMUSCULAR | Status: DC
  Administered 2021-12-11 – 2021-12-15 (×5): 1 [IU] via SUBCUTANEOUS

## 2021-12-08 MED ORDER — HEPARIN SODIUM (PORCINE) 5000 UNIT/ML IJ SOLN
5000.0000 [IU] | Freq: Three times a day (TID) | INTRAMUSCULAR | Status: DC
  Administered 2021-12-09 – 2021-12-15 (×17): 5000 [IU] via SUBCUTANEOUS
  Filled 2021-12-08 (×17): qty 1

## 2021-12-08 MED ORDER — INSULIN GLARGINE-YFGN 100 UNIT/ML ~~LOC~~ SOLN
12.0000 [IU] | Freq: Every day | SUBCUTANEOUS | Status: DC
  Filled 2021-12-08: qty 0.12

## 2021-12-08 MED ORDER — AMLODIPINE BESYLATE 10 MG PO TABS
10.0000 mg | ORAL_TABLET | Freq: Every day | ORAL | Status: DC
  Administered 2021-12-09 – 2021-12-14 (×6): 10 mg via ORAL
  Filled 2021-12-08 (×6): qty 1

## 2021-12-08 MED ORDER — CHLORHEXIDINE GLUCONATE CLOTH 2 % EX PADS
6.0000 | MEDICATED_PAD | Freq: Every day | CUTANEOUS | Status: DC
  Administered 2021-12-10 – 2021-12-14 (×5): 6 via TOPICAL

## 2021-12-08 MED ORDER — HYDRALAZINE HCL 25 MG PO TABS
25.0000 mg | ORAL_TABLET | Freq: Three times a day (TID) | ORAL | Status: DC
  Administered 2021-12-09 – 2021-12-10 (×3): 25 mg via ORAL
  Filled 2021-12-08 (×3): qty 1

## 2021-12-08 MED ORDER — SEVELAMER CARBONATE 800 MG PO TABS: 800.0000 mg | ORAL_TABLET | Freq: Three times a day (TID) | ORAL | Status: DC

## 2021-12-08 NOTE — ED Triage Notes (Signed)
Pt. Ststed, the kidney Dr. Durene Cal me here, said, My kidneys are only working at 8 %

## 2021-12-08 NOTE — H&P (Signed)
History and Physical    Randy Ross TML:[id number] DOB: 1971/05/14 DOA: 12/08/2021  PCP: Sueanne Margarita, DO (Confirm with patient/family/NH records and if not entered, this has to be entered at [facility] point of entry) Patient coming from: Home  I have personally briefly reviewed patient's old medical records in [place]  Chief Complaint: Feeling ok  HPI: Randy Ross is a 51 y.o. male with medical history significant of CKD stage IV, IgA MGUS, chronic microcytic anemia, IDDM, HTN, diabetic neuropathy, was sent from nephrology office for worsening of kidney function starting inpatient dialysis.  Patient was diagnosed CKD stage III 2020, and lost to follow-up during [place] pandemic.  Most recent nephrology visit October 2022, nephrology recommended patient's start HD given worsening of kidney function.  Patient reported decreased overall urine output and his urine color has been tea colored for the last 2 to 3 months.  And also reported increased nocturnal urine output, denies any dysuria.  Today, the patient went to see nephrology and was told toe come to hospital for starting HD.  Patient was diagnosed with MGUS 2 years ago has been following with hematology [place].  Last year, patient declined offer for bone marrow biopsy, and has lost to follow-up.  In October 2022, he was told that he has iron deficiency and started taking iron supplement.  He denies any black tarry stool, blood in the stool or chronic abdominal pains. No SOB or chest pains.  ED Course: Blood pressure significantly elevated.  Creatinine 16.5, BUN 106, bicarb 18.  Albumin 2.5.   Review of Systems: As per HPI otherwise 14 point review of systems negative.    No past medical history on file.  No past surgical history on file.   reports that he has been smoking cigarettes. He has never used smokeless tobacco. He reports current alcohol use. He reports that he does not currently use  drugs.  Allergies  Allergen Reactions   Gabapentin Shortness Of Breath and Rash    Scratchy throat    Metformin And Related Other (See Comments)    Affecting kidney function     No family history on file.   Prior to Admission medications   Medication Sig Start Date End Date Taking? Authorizing Provider  amLODipine (NORVASC) 10 MG tablet Take 10 mg by mouth at bedtime.    [provider]  atorvastatin (LIPITOR) 10 MG tablet Take 10 mg by mouth daily.    [provider]  cholecalciferol (VITAMIN D3) 25 MCG (1000 UNIT) tablet Take 1,000 Units by mouth daily.    [provider]  Emollient (CETAPHIL) cream Apply 1 application topically as needed (itching).    [provider]  furosemide (LASIX) 40 MG tablet Take 40 mg by mouth daily.    [provider]  sodium zirconium cyclosilicate (LOKELMA) 10 g PACK packet Take 10 g by mouth daily.    [provider]  tetrahydrozoline-zinc (VISINE-AC) 0.05-0.25 % ophthalmic solution Place 1 drop into both eyes 3 (three) times daily as needed (dry eyes).    [provider]  TOUJEO SOLOSTAR 300 UNIT/ML Solostar Pen Inject 12 Units into the skin every morning. 08/03/21   [provider]    Physical Exam: Vitals:   12/08/21 1409 12/08/21 1717 12/08/21 1719  BP: (!) 159/89 (!) 165/84   Pulse: 89 75 75  Resp: 16 17   Temp: 99.3 F (37.4 C)    TempSrc: Oral    SpO2: 99% 100% 100%    Constitutional:  NAD, calm, comfortable Vitals:   12/08/21 1409 12/08/21 1717 12/08/21 1719  BP: (!) 159/89 (!) 165/84   Pulse: 89 75 75  Resp: 16 17   Temp: 99.3 F (37.4 C)    TempSrc: Oral    SpO2: 99% 100% 100%   Eyes: PERRL, lids and conjunctivae normal ENMT: Mucous membranes are moist. Posterior pharynx clear of any exudate or lesions.Normal dentition.  Neck: normal, supple, no masses, no thyromegaly Respiratory: clear to auscultation bilaterally, no wheezing, no crackles. Normal  respiratory effort. No accessory muscle use.  Cardiovascular: Regular rate and rhythm, no murmurs / rubs / gallops. 1+ extremity edema. 2+ pedal pulses. No carotid bruits.  Abdomen: no tenderness, no masses palpated. No hepatosplenomegaly. Bowel sounds positive.  Musculoskeletal: no clubbing / cyanosis. No joint deformity upper and lower extremities. Good ROM, no contractures. Normal muscle tone.  Skin: no rashes, lesions, ulcers. No induration Neurologic: CN 2-12 grossly intact. Sensation intact, DTR normal. Strength 5/5 in all 4.  Psychiatric: Normal judgment and insight. Alert and oriented x 3. Normal mood.     Labs on Admission: I have personally reviewed following labs and imaging studies  CBC: Recent Labs  Lab 12/08/21 1444 12/08/21 1725  WBC 12.4*  --   NEUTROABS 9.8*  --   HGB 7.1* 7.5*  HCT 21.1* 22.0*  MCV 61.5*  --   PLT 330  --    Basic Metabolic Panel: Recent Labs  Lab 12/08/21 1444 12/08/21 1725  NA 138 138  K 4.2 4.2  CL 108 107  CO2 18*  --   GLUCOSE 109* 145*  BUN 106* 113*  CREATININE 16.51* >18.00*  CALCIUM 6.3*  --    GFR: CrCl cannot be calculated (This lab value cannot be used to calculate CrCl because it is not a number: >18.00). Liver Function Tests: Recent Labs  Lab 12/08/21 1444  AST 15  ALT 25  ALKPHOS 61  BILITOT 0.3  PROT 6.0*  ALBUMIN 2.5*   Recent Labs  Lab 12/08/21 1444  LIPASE 118*   No results for input(s): AMMONIA in the last 168 hours. Coagulation Profile: No results for input(s): INR, PROTIME in the last 168 hours. Cardiac Enzymes: No results for input(s): CKTOTAL, CKMB, CKMBINDEX, TROPONINI in the last 168 hours. BNP (last 3 results) No results for input(s): PROBNP in the last 8760 hours. HbA1C: No results for input(s): HGBA1C in the last 72 hours. CBG: No results for input(s): GLUCAP in the last 168 hours. Lipid Profile: No results for input(s): CHOL, HDL, LDLCALC, TRIG, CHOLHDL, LDLDIRECT in the last 72  hours. Thyroid Function Tests: No results for input(s): TSH, T4TOTAL, FREET4, T3FREE, THYROIDAB in the last 72 hours. Anemia Panel: No results for input(s): VITAMINB12, FOLATE, FERRITIN, TIBC, IRON, RETICCTPCT in the last 72 hours. Urine analysis:    Component Value Date/Time   COLORURINE YELLOW 12/08/2021 1505   APPEARANCEUR HAZY (A) 12/08/2021 1505   LABSPEC 1.015 12/08/2021 1505   PHURINE 5.0 12/08/2021 1505   GLUCOSEU 150 (A) 12/08/2021 1505   HGBUR NEGATIVE 12/08/2021 1505   BILIRUBINUR NEGATIVE 12/08/2021 1505   KETONESUR NEGATIVE 12/08/2021 1505   PROTEINUR >=300 (A) 12/08/2021 1505   NITRITE NEGATIVE 12/08/2021 1505   LEUKOCYTESUR NEGATIVE 12/08/2021 1505    Radiological Exams on Admission: No results found.  EKG: Independently reviewed.  Sinus, borderline QTc prolongation.  Assessment/Plan Principal Problem:   AKI (acute kidney injury) ([place]) Active Problems:   CKD (chronic kidney disease), stage IV ([place])  (please populate well  all problems here in Problem List. (For example, if patient is on BP meds at home and you resume or decide to hold them, it is a problem that needs to be her. Same for CAD, COPD, HLD and so on)  CKD stage V -With worsening of azotemia and uremia -Discussed with on-call nephrology Dr. Osborne Casco at bedside, starting HD tonight. -Add phosphorus binder  Acute on chronic microcytic anemia -Check iron study and reticulocyte count -No history of GI bleed, suspect chronic anemia secondary to CKD.  Need outpatient EPO.  Chronic proteinuria -Urine protein> 300 -Check urine albumin/creatinine ratio rule out active nephrotic syndrome  Hypoalbuminemia -Likely from chronic proteinuria.  MGUS -Long discussion with patient and his wife at bedside, patient prefers continue follow-up with hematology oncology at [facility].  HTN uncontrolled -Continue amlodipine, start hydralazine plus Imdur regimen  IDDM -Continue Lantus 10 units daily, add  sliding scale.  DVT prophylaxis: Heparin subcu Code Status: Full code Family Communication: Wife at bedside Disposition Plan: Expect more than 2 midnight hospital stay Consults called: Nephrology Admission status: Inpatient telemetry   Lequita Halt MD [facility] Pager [phone]  12/08/2021, 5:55 PM

## 2021-12-08 NOTE — Consult Note (Signed)
Nephrology Consult   Requesting provider: Wynetta Fines Service requesting consult: Hospitalist Reason for consult: ESRD   Assessment/Recommendations: Randy Ross is a/an 51 y.o. male with a past medical history HTN and DM2 who present w/ uremia 2/2 ESRD   ESRD: Progressive CKD now ESRD with symptoms of uremia.  No urgent need for dialysis -N.p.o. at midnight, consult IR for [facility] -Plan for dialysis tomorrow after [facility] placement -Obtain renal ultrasound -No need for IV fluids -Monitor Daily I/Os, Daily weight  -Maintain MAP>65 for optimal renal perfusion.  -Avoid nephrotoxic medications including NSAIDs -Use synthetic opioids (Fentanyl/Dilaudid) if needed -Will need vascular surgery consult in the coming days  Volume Status: Appears somewhat volume overloaded with lower extremity edema.  Can drink to thirst for now while not n.p.o.  Hypertension: Blood pressure fluctuating in the emergency department.  Can restart the  amlodipine.  Additional medications as needed.  Do not be too aggressive initially until the patient is more established on dialysis  Anemia of CKD: Hemoglobin most recently 7.5.  Likely related to CKD.  Obtain iron labs.  Likely dose ESA after results  Hypocalcemia: Confirmed on ionized sample.  Getting IV calcium now.  High calcium bath with dialysis.  Obtain PTH and consider calcitriol  Secondary hyperparathyroidism: Patient is on sevelamer.  Will obtain PTH  Metabolic acidosis: Bicarb 18.  Management with dialysis  Uncontrolled Diabetes Mellitus Type 2 with Hyperglycemia: Management per primary team  M spike: Present in 2020.  Consider repeating blood work for monitoring purposes   Recommendations conveyed to primary service.    [facility] 12/08/2021 5:54 PM   _____________________________________________________________________________________ CC: nausea, vomiting  History of Present Illness: Randy Ross is  a/an 51 y.o. male with a past medical history of HTN and DM2 who presents with nausea and vomiting.  Patient states that for the past few days he has had significant nausea and vomiting.  He is also having left lower extremity pain.  No blood in his emesis.  Denies significant dizziness or lightheadedness.  No shortness of breath or chest pain.  The patient has felt very hot when he vomits but denies any significant fevers or chills.  He has tried several over-the-counter remedies without significant improvement in his nausea or vomiting.  He denies significant weight loss over the preceding months.  Continues to note that he urinates fairly normally.  However, he does urinate more at night.  He has not had any hematuria or dysuria.  Does not pass any kidney stones.  The patient was last seen by a nephrologist in October 2022.  At that time it was recommended that he start dialysis but the patient chose not to.  He states that he was very busy and trying to change jobs and get a second opinion.  His diabetes has been poorly controlled in the past and is thought that his kidney disease is secondary to this.  He has started a transplant work-up but I think got lost to follow-up.  He was referred to vascular surgery but never went.  In the emergency department his creatinine was around 18.  Potassium was acceptable.      Medications:  Current Facility-Administered Medications  Medication Dose Route Frequency Provider Last Rate Last Admin   amLODipine (NORVASC) tablet 10 mg  10 mg Oral QHS Wynetta Fines T, MD       calcium gluconate 1 g/ 50 mL sodium chloride IVPB  1 g Intravenous Once Roemhildt, Lorin T, PA-C       [  START ON 12/09/2021] Chlorhexidine Gluconate Cloth 2 % PADS 6 each  6 each Topical Q0600 Reesa Chew, MD       [START ON 12/09/2021] cholecalciferol (VITAMIN D3) tablet 1,000 Units  1,000 Units Oral Daily Wynetta Fines T, MD       heparin injection 5,000 Units  5,000 Units Subcutaneous Q8H  Wynetta Fines T, MD       [START ON 12/09/2021] insulin glargine (1 Unit Dial) (TOUJEO) Solostar Pen SOPN 12 Units  12 Units Subcutaneous Cathlean Sauer T, MD       [START ON 12/09/2021] sevelamer carbonate (RENVELA) tablet 800 mg  800 mg Oral TID WC Lequita Halt, MD       Current Outpatient Medications  Medication Sig Dispense Refill   amLODipine (NORVASC) 10 MG tablet Take 10 mg by mouth at bedtime.     atorvastatin (LIPITOR) 10 MG tablet Take 10 mg by mouth daily.     cholecalciferol (VITAMIN D3) 25 MCG (1000 UNIT) tablet Take 1,000 Units by mouth daily.     Emollient (CETAPHIL) cream Apply 1 application topically as needed (itching).     furosemide (LASIX) 40 MG tablet Take 40 mg by mouth daily.     sodium zirconium cyclosilicate (LOKELMA) 10 g PACK packet Take 10 g by mouth daily.     tetrahydrozoline-zinc (VISINE-AC) 0.05-0.25 % ophthalmic solution Place 1 drop into both eyes 3 (three) times daily as needed (dry eyes).     TOUJEO SOLOSTAR 300 UNIT/ML Solostar Pen Inject 12 Units into the skin every morning.       ALLERGIES Gabapentin and Metformin and related  MEDICAL HISTORY No past medical history on file.   SOCIAL HISTORY Social History   Socioeconomic History   Marital status: Divorced    Spouse name: Not on file   Number of children: Not on file   Years of education: Not on file   Highest education level: Not on file  Occupational History   Not on file  Tobacco Use   Smoking status: Every Day    Types: Cigarettes   Smokeless tobacco: Never  Substance and Sexual Activity   Alcohol use: Yes   Drug use: Not Currently   Sexual activity: Not on file  Other Topics Concern   Not on file  Social History Narrative   Not on file   Social Determinants of Health   Financial Resource Strain: Not on file  Food Insecurity: Not on file  Transportation Needs: Not on file  Physical Activity: Not on file  Stress: Not on file  Social Connections: Not on file  Intimate  Partner Violence: Not on file     FAMILY HISTORY No family history on file.    Review of Systems: 12 systems reviewed Otherwise as per HPI, all other systems reviewed and negative  Physical Exam: Vitals:   12/08/21 1717 12/08/21 1719  BP: (!) 165/84   Pulse: 75 75  Resp: 17   Temp:    SpO2: 100% 100%   No intake/output data recorded. No intake or output data in the 24 hours ending 12/08/21 1754 General: well-appearing, no acute distress HEENT: anicteric sclera, oropharynx clear without lesions CV: regular rate, normal rhythm, no murmurs, no gallops, no rubs, 2+ peripheral edema Lungs: clear to auscultation bilaterally, normal work of breathing Abd: soft, non-tender, non-distended Skin: no visible lesions or rashes Psych: alert, engaged, appropriate mood and affect Musculoskeletal: no obvious deformities Neuro: normal speech, no gross focal deficits   Test  Results Reviewed Lab Results  Component Value Date   NA 138 12/08/2021   K 4.2 12/08/2021   CL 107 12/08/2021   CO2 18 (L) 12/08/2021   BUN 113 (H) 12/08/2021   CREATININE >18.00 (H) 12/08/2021   CALCIUM 6.3 (LL) 12/08/2021   ALBUMIN 2.5 (L) 12/08/2021    CBC Recent Labs  Lab 12/08/21 1444 12/08/21 1725  WBC 12.4*  --   NEUTROABS 9.8*  --   HGB 7.1* 7.5*  HCT 21.1* 22.0*  MCV 61.5*  --   PLT 330  --     I have reviewed all relevant outside healthcare records related to the patient's current hospitalization

## 2021-12-08 NOTE — ED Provider Triage Note (Signed)
Emergency Medicine Provider Triage Evaluation Note  Randy Ross , a 51 y.o. adult  was evaluated in triage.  Pt sent by [facility] for acute renal failure.  Patient been vomiting and feeling unwell.  Review of Systems  Positive:  Negative: See above   Physical Exam  BP (!) 159/89 (BP Location: Right Arm)    Pulse 89    Temp 99.3 F (37.4 C) (Oral)    Resp 16    SpO2 99%  Gen:   Awake, no distress   Resp:  Normal effort  MSK:   Moves extremities without difficulty  Other:    Medical Decision Making  Medically screening exam initiated at 2:35 PM.  Appropriate orders placed.  Kiril Asbill was informed that the remainder of the evaluation will be completed by another provider, this initial triage assessment does not replace that evaluation, and the importance of remaining in the ED until their evaluation is complete.     Myna Bright [place], [place] 12/08/21 1438

## 2021-12-08 NOTE — ED Provider Notes (Signed)
[facility] EMERGENCY DEPARTMENT Provider Note   CSN: [id number] Arrival date & time: 12/08/21  1358     History  Chief Complaint  Patient presents with   Acute Renal Failure    Randy Ross is a 51 y.o. male who presents the emergency department from his kidney doctor for acute renal failure.  Patient was seen by [facility] today, and was told that his kidneys are functioning at 8%.  He states that he has had some nausea, vomiting, and bilateral leg swelling.  No chest pain or shortness of breath.  No fevers, chills.  HPI     Home Medications Prior to Admission medications   Medication Sig Start Date End Date Taking? Authorizing Provider  amLODipine (NORVASC) 10 MG tablet Take 10 mg by mouth at bedtime.    [provider]  atorvastatin (LIPITOR) 10 MG tablet Take 10 mg by mouth daily.    [provider]  cholecalciferol (VITAMIN D3) 25 MCG (1000 UNIT) tablet Take 1,000 Units by mouth daily.    [provider]  Emollient (CETAPHIL) cream Apply 1 application topically as needed (itching).    [provider]  furosemide (LASIX) 40 MG tablet Take 40 mg by mouth daily.    [provider]  sodium zirconium cyclosilicate (LOKELMA) 10 g PACK packet Take 10 g by mouth daily.    [provider]  tetrahydrozoline-zinc (VISINE-AC) 0.05-0.25 % ophthalmic solution Place 1 drop into both eyes 3 (three) times daily as needed (dry eyes).    [provider]  TOUJEO SOLOSTAR 300 UNIT/ML Solostar Pen Inject 12 Units into the skin every morning. 08/03/21   [provider]      Allergies    Gabapentin and Metformin and related    Review of Systems   Review of Systems  Constitutional:  Negative for chills and fever.  Respiratory:  Negative for shortness of breath.   Cardiovascular:  Positive for leg swelling. Negative for chest pain.  Gastrointestinal:  Positive for nausea and vomiting.  All other  systems reviewed and are negative.  Physical Exam Updated Vital Signs BP (!) 165/84 (BP Location: Left Arm)    Pulse 75    Temp 99.3 F (37.4 C) (Oral)    Resp 17    SpO2 100%  Physical Exam Vitals and nursing note reviewed.  Constitutional:      Appearance: Normal appearance.  HENT:     Head: Normocephalic and atraumatic.  Eyes:     Conjunctiva/sclera: Conjunctivae normal.  Cardiovascular:     Rate and Rhythm: Normal rate and regular rhythm.  Pulmonary:     Effort: Pulmonary effort is normal. No respiratory distress.     Breath sounds: Normal breath sounds.  Abdominal:     General: There is no distension.     Palpations: Abdomen is soft.     Tenderness: There is no abdominal tenderness.  Musculoskeletal:     Right lower leg: 1+ Edema present.     Left lower leg: 1+ Edema present.  Skin:    General: Skin is warm and dry.  Neurological:     General: No focal deficit present.     Mental Status: He is alert.    ED Results / Procedures / Treatments   Labs (all labs ordered are listed, but only abnormal results are displayed) Labs Reviewed  URINALYSIS, ROUTINE W REFLEX MICROSCOPIC - Abnormal; Notable for the following components:      Result Value   APPearance HAZY (*)  Glucose, UA 150 (*)    Protein, ur >=300 (*)    Bacteria, UA RARE (*)    All other components within normal limits  CBC WITH DIFFERENTIAL/PLATELET - Abnormal; Notable for the following components:   WBC 12.4 (*)    RBC 3.43 (*)    Hemoglobin 7.1 (*)    HCT 21.1 (*)    MCV 61.5 (*)    MCH 20.7 (*)    RDW 21.2 (*)    Neutro Abs 9.8 (*)    All other components within normal limits  COMPREHENSIVE METABOLIC PANEL - Abnormal; Notable for the following components:   CO2 18 (*)    Glucose, Bld 109 (*)    BUN 106 (*)    Creatinine, Ser 16.51 (*)    Calcium 6.3 (*)    Total Protein 6.0 (*)    Albumin 2.5 (*)    GFR, Estimated 3 (*)    All other components within normal limits  LIPASE, BLOOD -  Abnormal; Notable for the following components:   Lipase 118 (*)    All other components within normal limits  I-STAT CHEM 8, ED - Abnormal; Notable for the following components:   BUN 113 (*)    Creatinine, Ser >18.00 (*)    Glucose, Bld 145 (*)    Calcium, Ion 0.85 (*)    TCO2 18 (*)    Hemoglobin 7.5 (*)    HCT 22.0 (*)    All other components within normal limits    EKG EKG Interpretation  Date/Time:  Tuesday December 08 2021 14:12:21 EST Ventricular Rate:  79 PR Interval:  148 QRS Duration: 82 QT Interval:  450 QTC Calculation: 516 R Axis:   75 Text Interpretation: Normal sinus rhythm Left ventricular hypertrophy with repolarization abnormality ( R in aVL ) Possible Inferior infarct , age undetermined Prolonged QT Abnormal ECG No previous ECGs available Confirmed by Wandra Arthurs [phone]) on 12/08/2021 4:44:01 PM  Radiology No results found.  Procedures Procedures    Medications Ordered in ED Medications - No data to display  ED Course/ Medical Decision Making/ A&P                           Medical Decision Making Is a 51 year old male with a history of chronic kidney disease who presents to the emergency department from [facility] for acute renal failure.  Patient has never previously been on dialysis.  On my exam patient is afebrile, not tachycardic,, no acute distress.  He is complaining of bilateral leg swelling and some nausea.  No chest pain or shortness of breath.  Lab evaluation shows a creatinine of 16.51, hypocalcemia of 6.3 (plan on recheck with I-stat Chem 8), normal potassium, mild leukocytosis of 12,400, hemoglobin of 7.1.  EKG performed and interpreted by my attending physician which shows normal sinus rhythm with prolonged QT. I agree with this interpretation.  Consulted with Dr. Joelyn Oms with nephrology who recommended patient be admitted to medicine for dialysis.  Consulted with hospitalist Dr. Roosevelt Locks who accepted the patient for  admission.  The patient appears reasonably stabilized for admission considering the current resources, flow, and capabilities available in the ED at this time, and I doubt any other [facility] requiring further screening and/or treatment in the ED prior to admission.  Final Clinical Impression(s) / ED Diagnoses Final diagnoses:  Acute renal failure, unspecified acute renal failure type ([place])    Rx / DC Orders ED Discharge Orders  None      Portions of this report may have been transcribed using voice recognition software. Every effort was made to ensure accuracy; however, inadvertent computerized transcription errors may be present.    Estill Cotta 12/08/21 1731    Drenda Freeze, MD 12/08/21 2041

## 2021-12-09 ENCOUNTER — Other Ambulatory Visit: Payer: Self-pay

## 2021-12-09 ENCOUNTER — Encounter (HOSPITAL_COMMUNITY): Payer: Self-pay | Admitting: Internal Medicine

## 2021-12-09 ENCOUNTER — Inpatient Hospital Stay (HOSPITAL_COMMUNITY): Payer: Medicaid Other

## 2021-12-09 DIAGNOSIS — N185 Chronic kidney disease, stage 5: Secondary | ICD-10-CM

## 2021-12-09 DIAGNOSIS — N179 Acute kidney failure, unspecified: Secondary | ICD-10-CM

## 2021-12-09 HISTORY — PX: IR FLUORO GUIDE CV LINE RIGHT: IMG2283

## 2021-12-09 HISTORY — PX: IR US GUIDE VASC ACCESS RIGHT: IMG2390

## 2021-12-09 LAB — CBC
HCT: 21.2 % — ABNORMAL LOW (ref 39.0–52.0)
Hemoglobin: 7.1 g/dL — ABNORMAL LOW (ref 13.0–17.0)
MCH: 20.5 pg — ABNORMAL LOW (ref 26.0–34.0)
MCHC: 33.5 g/dL (ref 30.0–36.0)
MCV: 61.1 fL — ABNORMAL LOW (ref 80.0–100.0)
Platelets: 328 10*3/uL (ref 150–400)
RBC: 3.47 MIL/uL — ABNORMAL LOW (ref 4.22–5.81)
RDW: 21.2 % — ABNORMAL HIGH (ref 11.5–15.5)
WBC: 13.3 10*3/uL — ABNORMAL HIGH (ref 4.0–10.5)
nRBC: 0 % (ref 0.0–0.2)

## 2021-12-09 LAB — COMPREHENSIVE METABOLIC PANEL
ALT: 29 U/L (ref 0–44)
AST: 18 U/L (ref 15–41)
Albumin: 2.4 g/dL — ABNORMAL LOW (ref 3.5–5.0)
Alkaline Phosphatase: 56 U/L (ref 38–126)
Anion gap: 15 (ref 5–15)
BUN: 107 mg/dL — ABNORMAL HIGH (ref 6–20)
CO2: 16 mmol/L — ABNORMAL LOW (ref 22–32)
Calcium: 6.5 mg/dL — ABNORMAL LOW (ref 8.9–10.3)
Chloride: 108 mmol/L (ref 98–111)
Creatinine, Ser: 16.31 mg/dL — ABNORMAL HIGH (ref 0.61–1.24)
GFR, Estimated: 3 mL/min — ABNORMAL LOW (ref >60)
Glucose, Bld: 87 mg/dL (ref 70–99)
Potassium: 4.5 mmol/L (ref 3.5–5.1)
Sodium: 139 mmol/L (ref 135–145)
Total Bilirubin: 0.1 mg/dL — ABNORMAL LOW (ref 0.3–1.2)
Total Protein: 5.7 g/dL — ABNORMAL LOW (ref 6.5–8.1)

## 2021-12-09 LAB — IRON AND TIBC
Iron: 86 ug/dL (ref 45–182)
Saturation Ratios: 50 % — ABNORMAL HIGH (ref 17.9–39.5)
TIBC: 171 ug/dL — ABNORMAL LOW (ref 250–450)
UIBC: 85 ug/dL

## 2021-12-09 LAB — VITAMIN B12: Vitamin B-12: 550 pg/mL (ref 180–914)

## 2021-12-09 LAB — RESP PANEL BY RT-PCR (FLU A&B, COVID) ARPGX2
Influenza A by PCR: NEGATIVE
Influenza B by PCR: NEGATIVE
SARS Coronavirus 2 by RT PCR: NEGATIVE

## 2021-12-09 LAB — FERRITIN: Ferritin: 253 ng/mL (ref 24–336)

## 2021-12-09 LAB — PHOSPHORUS: Phosphorus: 9.4 mg/dL — ABNORMAL HIGH (ref 2.5–4.6)

## 2021-12-09 LAB — RETICULOCYTES
Immature Retic Fract: 15 % (ref 2.3–15.9)
RBC.: 3.2 MIL/uL — ABNORMAL LOW (ref 4.22–5.81)
Retic Count, Absolute: 42.6 10*3/uL (ref 19.0–186.0)
Retic Ct Pct: 1.3 % (ref 0.4–3.1)

## 2021-12-09 LAB — FOLATE: Folate: 11.9 ng/mL (ref >5.9)

## 2021-12-09 LAB — MAGNESIUM: Magnesium: 1.5 mg/dL — ABNORMAL LOW (ref 1.7–2.4)

## 2021-12-09 LAB — CBG MONITORING, ED
Glucose-Capillary: 73 mg/dL (ref 70–99)
Glucose-Capillary: 78 mg/dL (ref 70–99)

## 2021-12-09 LAB — GLUCOSE, CAPILLARY
Glucose-Capillary: 207 mg/dL — ABNORMAL HIGH (ref 70–99)
Glucose-Capillary: 185 mg/dL — ABNORMAL HIGH (ref 70–99)

## 2021-12-09 LAB — ABO/RH: ABO/RH(D): B POS

## 2021-12-09 MED ORDER — HEPARIN SODIUM (PORCINE) 1000 UNIT/ML IJ SOLN
INTRAMUSCULAR | Status: AC
  Filled 2021-12-09: qty 3

## 2021-12-09 MED ORDER — CEFAZOLIN SODIUM-DEXTROSE 2-4 GM/100ML-% IV SOLN
INTRAVENOUS | Status: AC
  Filled 2021-12-09: qty 100

## 2021-12-09 MED ORDER — INSULIN GLARGINE-YFGN 100 UNIT/ML ~~LOC~~ SOLN
10.0000 [IU] | Freq: Every day | SUBCUTANEOUS | Status: DC
  Administered 2021-12-09 – 2021-12-14 (×6): 10 [IU] via SUBCUTANEOUS
  Filled 2021-12-09 (×7): qty 0.1

## 2021-12-09 MED ORDER — MAGNESIUM SULFATE 2 GM/50ML IV SOLN
2.0000 g | Freq: Once | INTRAVENOUS | Status: AC
  Administered 2021-12-09: 2 g via INTRAVENOUS
  Filled 2021-12-09: qty 50

## 2021-12-09 MED ORDER — FENTANYL CITRATE (PF) 100 MCG/2ML IJ SOLN
INTRAMUSCULAR | Status: AC
  Filled 2021-12-09: qty 2

## 2021-12-09 MED ORDER — CEFAZOLIN SODIUM-DEXTROSE 2-4 GM/100ML-% IV SOLN
INTRAVENOUS | Status: AC | PRN
  Administered 2021-12-09: 2 g via INTRAVENOUS

## 2021-12-09 MED ORDER — SODIUM CHLORIDE 0.9 % IV SOLN: 100.0000 mL | INTRAVENOUS | Status: DC | PRN

## 2021-12-09 MED ORDER — LIDOCAINE HCL (PF) 1 % IJ SOLN
5.0000 mL | INTRAMUSCULAR | Status: DC | PRN
  Filled 2021-12-09: qty 5

## 2021-12-09 MED ORDER — HEPARIN SODIUM (PORCINE) 1000 UNIT/ML IJ SOLN
INTRAMUSCULAR | Status: AC | PRN
  Administered 2021-12-09: 3200 [IU] via INTRAVENOUS

## 2021-12-09 MED ORDER — PENTAFLUOROPROP-TETRAFLUOROETH EX AERO
1.0000 "application " | INHALATION_SPRAY | CUTANEOUS | Status: DC | PRN
  Filled 2021-12-09: qty 116

## 2021-12-09 MED ORDER — LIDOCAINE-PRILOCAINE 2.5-2.5 % EX CREA
1.0000 "application " | TOPICAL_CREAM | CUTANEOUS | Status: DC | PRN
  Filled 2021-12-09: qty 5

## 2021-12-09 MED ORDER — ALTEPLASE 2 MG IJ SOLR: 2.0000 mg | Freq: Once | INTRAMUSCULAR | Status: DC | PRN

## 2021-12-09 MED ORDER — HEPARIN SODIUM (PORCINE) 1000 UNIT/ML IJ SOLN
INTRAMUSCULAR | Status: AC
  Filled 2021-12-09: qty 10

## 2021-12-09 MED ORDER — FENTANYL CITRATE (PF) 100 MCG/2ML IJ SOLN
INTRAMUSCULAR | Status: AC | PRN
  Administered 2021-12-09: 50 ug via INTRAVENOUS

## 2021-12-09 MED ORDER — CEFAZOLIN SODIUM-DEXTROSE 2-4 GM/100ML-% IV SOLN
2.0000 g | INTRAVENOUS | Status: AC
  Filled 2021-12-09: qty 100

## 2021-12-09 MED ORDER — LIDOCAINE-EPINEPHRINE 1 %-1:100000 IJ SOLN
INTRAMUSCULAR | Status: AC | PRN
  Administered 2021-12-09: 10 mL

## 2021-12-09 MED ORDER — SODIUM CHLORIDE 0.9 % IV SOLN
1.5000 g | INTRAVENOUS | Status: AC
  Filled 2021-12-09 (×2): qty 1.5

## 2021-12-09 MED ORDER — HEPARIN SODIUM (PORCINE) 1000 UNIT/ML DIALYSIS
1000.0000 [IU] | INTRAMUSCULAR | Status: DC | PRN
  Administered 2021-12-09 – 2021-12-12 (×2): 3200 [IU] via INTRAVENOUS_CENTRAL
  Filled 2021-12-09 (×3): qty 1

## 2021-12-09 MED ORDER — MIDAZOLAM HCL 2 MG/2ML IJ SOLN
INTRAMUSCULAR | Status: AC | PRN
  Administered 2021-12-09: 1 mg via INTRAVENOUS

## 2021-12-09 MED ORDER — CARVEDILOL 3.125 MG PO TABS
3.1250 mg | ORAL_TABLET | Freq: Two times a day (BID) | ORAL | Status: DC
  Administered 2021-12-09 – 2021-12-10 (×3): 3.125 mg via ORAL
  Filled 2021-12-09 (×3): qty 1

## 2021-12-09 MED ORDER — CALCIUM GLUCONATE-NACL 1-0.675 GM/50ML-% IV SOLN
1.0000 g | Freq: Once | INTRAVENOUS | Status: AC
  Administered 2021-12-09: 1000 mg via INTRAVENOUS
  Filled 2021-12-09: qty 50

## 2021-12-09 MED ORDER — LIDOCAINE-EPINEPHRINE 1 %-1:100000 IJ SOLN
INTRAMUSCULAR | Status: AC
  Filled 2021-12-09: qty 1

## 2021-12-09 MED ORDER — MIDAZOLAM HCL 2 MG/2ML IJ SOLN
INTRAMUSCULAR | Status: AC
  Filled 2021-12-09: qty 2

## 2021-12-09 NOTE — Sedation Documentation (Signed)
Patient alert and oriented prior to discharge from IR. No complaints of pain. Sats 100% on room and HR 67, normal sinus rhythm ? ?

## 2021-12-09 NOTE — Progress Notes (Signed)
Mr. Starnes arrived to [phone] with wife at bedside. [facility] able to transport self from bed to stretcher with no issues. CHG bath completed. Skin assessed with DesiRay. Oriented to room, bed controls, and call light.  ?

## 2021-12-09 NOTE — Progress Notes (Signed)
Requested to see pt for out-pt HD needs. Pt currently in the ED and unable to reach via phone. Will continue efforts to meet with pt as able.  ? ?[name] ?Renal Navigator ?[phone] ?

## 2021-12-09 NOTE — Consult Note (Signed)
? ?Chief Complaint: ?Patient was seen in consultation today for  ?Chief Complaint  ?Patient presents with  ? Acute Renal Failure  ? ? ?Referring Physician(s): Dr. Joylene Grapes ? ?Supervising Physician: Ruthann Cancer ? ?Patient Status: [facility] - ED ? ?History of Present Illness: ?Randy Ross is a 51 y.o. male with a medical history significant for CKD Stage IV, IgA MGUS, DM and HTN. He was sent to [facility] ED yesterday by his nephrologist for worsening kidney function and to start inpatient dialysis. Lab work in the ED was notable for a creatinine of 16.5, calcium 6.3 and hemoglobin of 7.1. The patient has plans to start hemodialysis today and Interventional Radiology has been asked to place a tunneled dialysis catheter.  ? ?No past medical history on file. ? ?No past surgical history on file. ? ?Allergies: ?Gabapentin and Metformin and related ? ?Medications: ?Prior to Admission medications   ?Medication Sig Start Date End Date Taking? Authorizing Provider  ?amLODipine (NORVASC) 10 MG tablet Take 10 mg by mouth at bedtime.   Yes [provider]  ?Camphor-Eucalyptus-Menthol (VICKS VAPORUB) 4.7-1.2-2.6 % OINT Apply 1 application topically daily as needed (pain).   Yes [provider]  ?carvedilol (COREG) 3.125 MG tablet Take 3.125 mg by mouth 2 (two) times daily with a meal.   Yes [provider]  ?Emollient (CETAPHIL) cream Apply 1 application topically as needed (itching).   Yes [provider]  ?ferrous sulfate 325 (65 FE) MG EC tablet Take 325 mg by mouth daily with breakfast.   Yes [provider]  ?furosemide (LASIX) 40 MG tablet Take 40 mg by mouth daily.   Yes [provider]  ?tetrahydrozoline-zinc (VISINE-AC) 0.05-0.25 % ophthalmic solution Place 1 drop into both eyes 3 (three) times daily as needed (dry eyes).   Yes [provider]  ?tiZANidine (ZANAFLEX) 4 MG tablet Take 4 mg by mouth 3 (three) times daily as needed for muscle spasms.   Yes  [provider]  ?TOUJEO SOLOSTAR 300 UNIT/ML Solostar Pen Inject 12 Units into the skin every morning. 08/03/21  Yes [provider]  ?  ? ?No family history on file. ? ?Social History  ? ?Socioeconomic History  ? Marital status: Divorced  ?  Spouse name: Not on file  ? Number of children: Not on file  ? Years of education: Not on file  ? Highest education level: Not on file  ?Occupational History  ? Not on file  ?Tobacco Use  ? Smoking status: Every Day  ?  Types: Cigarettes  ? Smokeless tobacco: Never  ?Substance and Sexual Activity  ? Alcohol use: Yes  ? Drug use: Not Currently  ? Sexual activity: Not on file  ?Other Topics Concern  ? Not on file  ?Social History Narrative  ? Not on file  ? ?Social Determinants of Health  ? ?Financial Resource Strain: Not on file  ?Food Insecurity: Not on file  ?Transportation Needs: Not on file  ?Physical Activity: Not on file  ?Stress: Not on file  ?Social Connections: Not on file  ? ? ?Review of Systems: A 12 point ROS discussed and pertinent positives are indicated in the HPI above.  All other systems are negative. ? ?Review of Systems  ?Constitutional:  Positive for fatigue. Negative for appetite change.  ?Respiratory:  Negative for cough and shortness of breath.   ?Cardiovascular:  Positive for leg swelling. Negative for chest pain.  ?Gastrointestinal:  Positive for abdominal pain and nausea. Negative for diarrhea and vomiting.  ?  Genitourinary:  Positive for decreased urine volume.  ?Neurological:  Negative for dizziness and headaches.  ? ?Vital Signs: ?BP (!) 174/88 (BP Location: Right Arm)   Pulse 89   Temp 98 ?F (36.7 ?C)   Resp 20   SpO2 100%  ? ?Physical Exam ?Constitutional:   ?   General: He is not in acute distress. ?   Appearance: He is not ill-appearing.  ?HENT:  ?   Mouth/Throat:  ?   Mouth: Mucous membranes are moist.  ?   Pharynx: Oropharynx is clear.  ?Cardiovascular:  ?   Rate and Rhythm: Normal rate and regular rhythm.  ?   Pulses:  Normal pulses.  ?   Heart sounds: Normal heart sounds.  ?Pulmonary:  ?   Effort: Pulmonary effort is normal.  ?   Breath sounds: Normal breath sounds.  ?Abdominal:  ?   General: Bowel sounds are normal.  ?   Palpations: Abdomen is soft.  ?Musculoskeletal:  ?   Right lower leg: Edema present.  ?   Left lower leg: Edema present.  ?Skin: ?   General: Skin is warm and dry.  ?Neurological:  ?   Mental Status: He is alert and oriented to person, place, and time.  ? ? ?Imaging: ?US RENAL ? ?Result Date: 12/08/2021 ?CLINICAL DATA:  Renal dysfunction EXAM: RENAL / URINARY TRACT ULTRASOUND COMPLETE COMPARISON:  None. FINDINGS: Right Kidney: Renal measurements: 9 x 4.1 x 4.3 cm = volume: 83.57 mL. There is increased cortical echogenicity. There is no hydronephrosis. Left Kidney: Renal measurements: 8.7 x 5.3 x 4.5 cm = volume: 109.98 mL. There is increased cortical echogenicity. There is no hydronephrosis. Bladder: Appears normal for degree of bladder distention. Other: None. IMPRESSION: There is no hydronephrosis. There is increased cortical echogenicity in the kidneys suggesting possible medical renal disease. Electronically Signed   By: Elmer Picker M.D.   On: 12/08/2021 18:16  ? ?DG Chest Port 1 View ? ?Result Date: 12/09/2021 ?CLINICAL DATA:  51 year old male with shortness of breath. EXAM: PORTABLE CHEST 1 VIEW COMPARISON:  None. FINDINGS: Portable AP semi upright view at 0624 hours. Mildly rotated to the right. Heart size at the upper limits of normal. Somewhat low lung volumes. Other mediastinal contours are within normal limits. Visualized tracheal air column is within normal limits. Allowing for portable technique the lungs are clear. No pneumothorax or pleural effusion. No osseous abnormality identified. Negative visible bowel gas. IMPRESSION: No acute cardiopulmonary abnormality. Electronically Signed   By: Genevie Ann M.D.   On: 12/09/2021 07:19   ? ?Labs: ? ?CBC: ?Recent Labs  ?  12/08/21 ?1444 12/08/21 ?1725  12/09/21 ?0272  ?WBC 12.4*  --  13.3*  ?HGB 7.1* 7.5* 7.1*  ?HCT 21.1* 22.0* 21.2*  ?PLT 330  --  328  ? ? ?COAGS: ?No results for input(s): INR, APTT in the last 8760 hours. ? ?BMP: ?Recent Labs  ?  12/08/21 ?1444 12/08/21 ?1725 12/09/21 ?5366  ?NA 138 138 139  ?K 4.2 4.2 4.5  ?CL 108 107 108  ?CO2 18*  --  16*  ?GLUCOSE 109* 145* 87  ?BUN 106* 113* 107*  ?CALCIUM 6.3*  --  6.5*  ?CREATININE 16.51* >18.00* 16.31*  ?GFRNONAA 3*  --  3*  ? ? ?LIVER FUNCTION TESTS: ?Recent Labs  ?  12/08/21 ?1444 12/09/21 ?4403  ?BILITOT 0.3 0.1*  ?AST 15 18  ?ALT 25 29  ?ALKPHOS 61 56  ?PROT 6.0* 5.7*  ?ALBUMIN 2.5* 2.4*  ? ? ?TUMOR  MARKERS: ?No results for input(s): AFPTM, CEA, CA199, CHROMGRNA in the last 8760 hours. ? ?Assessment and Plan: ? ?ESRD; hemodialysis: Randy Ross, 51 year old male, is tentatively scheduled today for an image-guided tunneled dialysis catheter.  ? ?Risks and benefits discussed with the patient including, but not limited to bleeding, infection, vascular injury, pneumothorax which may require chest tube placement, air embolism or even death ? ?All of the patient's questions were answered, patient is agreeable to proceed. He has been NPO.  ? ?Consent signed and in chart. ? ?Thank you for this interesting consult.  I greatly enjoyed meeting Randy Ross and look forward to participating in their care.  A copy of this report was sent to the requesting provider on this date. ? ?Electronically Signed: ?Soyla Dryer, AGACNP-BC ?[phone] ?12/09/2021, 9:49 AM ? ? ?I spent a total of 20 Minutes    in face to face in clinical consultation, greater than 50% of which was counseling/coordinating care for tunneled dialysis catheter  ? ?

## 2021-12-09 NOTE — Consult Note (Addendum)
[facility] ?CONSULT NOTE ? ? ?MRN : [id number] ? ?Reason for Consult: AKKI on CKD  ?Referring Physician:  Joelyn Oms MD ? ?History of Present Illness: 51 y/o male with history of CKD no ESRD.  IR placed right IJ today and we have been asked to provide permanent access for HD.  He has a history of DM, HTN, and IgA MGUS and is followed by hematology.   ?  ?  ? ? ?Current Facility-Administered Medications  ?Medication Dose Route Frequency Provider Last Rate Last Admin  ? 0.9 %  sodium chloride infusion  100 mL Intravenous PRN Reesa Chew, MD      ? 0.9 %  sodium chloride infusion  100 mL Intravenous PRN Reesa Chew, MD      ? alteplase (CATHFLO ACTIVASE) injection 2 mg  2 mg Intracatheter Once PRN Reesa Chew, MD      ? amLODipine (NORVASC) tablet 10 mg  10 mg Oral QHS Wynetta Fines T, MD      ? carvedilol (COREG) tablet 3.125 mg  3.125 mg Oral BID WC Thurnell Lose, MD   3.125 mg at 12/09/21 6659  ? ceFAZolin (ANCEF) 2-4 GM/100ML-% IVPB           ? ceFAZolin (ANCEF) IVPB 2g/100 mL premix  2 g Intravenous to XRAY [place], Ardeth Perfect, NP      ? Chlorhexidine Gluconate Cloth 2 % PADS 6 each  6 each Topical Q0600 Reesa Chew, MD      ? cholecalciferol (VITAMIN D3) tablet 1,000 Units  1,000 Units Oral Daily Lequita Halt, MD   1,000 Units at 12/09/21 9357  ? fentaNYL (SUBLIMAZE) 100 MCG/2ML injection           ? heparin injection 1,000 Units  1,000 Units Dialysis PRN Reesa Chew, MD      ? heparin injection 5,000 Units  5,000 Units Subcutaneous Q8H Wynetta Fines T, MD   5,000 Units at 12/09/21 0177  ? heparin sodium (porcine) 1000 UNIT/ML injection           ? hydrALAZINE (APRESOLINE) tablet 25 mg  25 mg Oral Q8H Lequita Halt, MD   25 mg at 12/09/21 9390  ? insulin aspart (novoLOG) injection 0-6 Units  0-6 Units Subcutaneous TID WC Wynetta Fines T, MD      ? insulin glargine-yfgn [facility]) injection 10 Units  10 Units Subcutaneous QHS Thurnell Lose, MD      ?  isosorbide mononitrate (IMDUR) 24 hr tablet 30 mg  30 mg Oral Daily Wynetta Fines T, MD   30 mg at 12/09/21 3009  ? lidocaine (PF) (XYLOCAINE) 1 % injection 5 mL  5 mL Intradermal PRN Reesa Chew, MD      ? lidocaine-EPINEPHrine (XYLOCAINE W/EPI) 1 %-1:100000 (with pres) injection           ? lidocaine-prilocaine (EMLA) cream 1 application  1 application Topical PRN Reesa Chew, MD      ? midazolam (VERSED) 2 MG/2ML injection           ? pentafluoroprop-tetrafluoroeth (GEBAUERS) aerosol 1 application  1 application Topical PRN Reesa Chew, MD      ? sevelamer carbonate (RENVELA) tablet 800 mg  800 mg Oral TID WC Lequita Halt, MD      ? ? ?Pt meds include: ?Statin :No ?Betablocker: Yes ?ASA: No ?Other anticoagulants/antiplatelets: none ? ?Past Medical History:  ?Diagnosis Date  ? CKD (chronic  kidney disease)   ? ? ?Past Surgical History:  ?Procedure Laterality Date  ? IR FLUORO GUIDE CV LINE RIGHT  12/09/2021  ? IR US GUIDE VASC ACCESS RIGHT  12/09/2021  ? [place] SURGERY  2021  ? L5-S1  ? ? ?Social History ?Social History  ? ?Tobacco Use  ? Smoking status: Some Days  ?  Packs/day: 0.15  ?  Types: Cigarettes  ? Smokeless tobacco: Never  ?Substance Use Topics  ? Alcohol use: Yes  ? Drug use: Not Currently  ? ? ?Family History ?History reviewed. No pertinent family history. ? ?Allergies  ?Allergen Reactions  ? Gabapentin Shortness Of Breath and Rash  ?  Scratchy throat ?  ? Metformin And Related Other (See Comments)  ?  Affecting kidney function ?  ? ? ? ?REVIEW OF SYSTEMS ? ?General: [ ]  Weight loss, [ ]  Fever, [ ]  chills ?Neurologic: [ ]  Dizziness, [ ]  Blackouts, [ ]  Seizure ?[ ]  Stroke, [ ]  "Mini stroke", [ ]  Slurred speech, [ ]  Temporary blindness; [ ]  weakness in arms or legs, [ ]  Hoarseness [ ]  Dysphagia ?Cardiac: [ ]  Chest pain/pressure, [ ]  Shortness of breath at rest [ ]  Shortness of breath with exertion, [ ]  Atrial fibrillation or irregular heartbeat  ?Vascular: [ ]  Pain in legs with walking,  [ ]  Pain in legs at rest, [ ]  Pain in legs at night, ? [ ]  Non-healing ulcer, [ ]  Blood clot in vein/DVT,   ?Pulmonary: [ ]  Home oxygen, [ ]  Productive cough, [ ]  Coughing up blood, [ ]  Asthma, ? [ ]  Wheezing [ ]  COPD ?Musculoskeletal:  [ ]  Arthritis, [ ]  Low back pain, [ ]  Joint pain ?Hematologic: [ ]  Easy Bruising, [ x] Anemia; [ ]  Hepatitis ?Gastrointestinal: [ ]  Blood in stool, [ ]  Gastroesophageal Reflux/heartburn, ?Urinary: [ x] chronic Kidney disease, [x ] on HD - [ ]  MWF or [ ]  TTHS, [ ]  Burning with urination, [ ]  Difficulty urinating ?Skin: [ ]  Rashes, [ ]  Wounds ?Psychological: [ ]  Anxiety, [ ]  Depression ? ?Physical Examination ?Vitals:  ? 12/09/21 1330 12/09/21 1340 12/09/21 1345 12/09/21 1636  ?BP: (!) 168/83 (!) 165/85 (!) 162/78 (!) 177/91  ?Pulse: 66 67 70 76  ?Resp: 16 20 15  (!) 22  ?Temp:    97.9 ?F (36.6 ?C)  ?TempSrc:    Oral  ?SpO2: 100% 100% 100% 100%  ? ?There is no height or weight on file to calculate BMI. ? ?General:  WDWN in NAD ?HENT: WNL ?Eyes: Pupils equal ?Pulmonary: normal non-labored breathing , without Rales, rhonchi,  wheezing ?Cardiac: RRR, without  Murmurs, rubs or gallops; ?No carotid bruits ?Abdomen: soft, NT, no masses ?Skin: no rashes, ulcers noted;  no Gangrene , no cellulitis; no open wounds;  ? ?Vascular Exam/Pulses:Palpable radial pulses B ? ? ?Musculoskeletal: no muscle wasting or atrophy; no edema  ?Neurologic: A&O X 3; Appropriate Affect ;  ?SENSATION: normal; ?MOTOR FUNCTION: 5/5 Symmetric ?Speech is fluent/normal ? ? ?Significant Diagnostic Studies: ?CBC ?Lab Results  ?Component Value Date  ? WBC 13.3 (H) 12/09/2021  ? HGB 7.1 (L) 12/09/2021  ? HCT 21.2 (L) 12/09/2021  ? MCV 61.1 (L) 12/09/2021  ? PLT 328 12/09/2021  ? ? ?BMET ?   ?Component Value Date/Time  ? NA 139 12/09/2021 0538  ? K 4.5 12/09/2021 0538  ? CL 108 12/09/2021 0538  ? CO2 16 (L) 12/09/2021 0538  ? GLUCOSE 87 12/09/2021 0538  ? BUN 107 (H) 12/09/2021  8127  ? CREATININE 16.31 (H) 12/09/2021 0538  ?  CREATININE 2.54 (H) 08/09/2019 0827  ? CALCIUM 6.5 (L) 12/09/2021 0538  ? GFRNONAA 3 (L) 12/09/2021 5170  ? GFRNONAA NOT CALCULATED 08/09/2019 0827  ? GFRAA NOT CALCULATED 08/09/2019 0827  ? ?CrCl cannot be calculated (Unknown ideal weight.). ? ?COAG ?No results found for: INR, PROTIME ? ? ?Non-Invasive Vascular Imaging: pending vein mapping ? ?ASSESSMENT/PLAN:  ?AKI on CKD now requiring HD. ?IR placed right [facility] today and he is currently receiving HD. ?We discussed permanent access and it could be in the form of a fistula verses graft.  We will order vein mapping and for plan left UE AV fistula verses graft.  He agrees with this plan.  He is right hand dominant.   ? ? ? ?Randy Ross ?12/09/2021 ?5:02 PM ? ? ?I agree with the above.  I have seen and examined the patient.  He is a new start dialysis.  He had a right sided catheter placed by interventional radiology today.  He is right-handed.  He does not have a pacemaker or defibrillator.  He has a palpable left radial pulse.  We discussed proceeding with left arm access tomorrow.  He has not had vein mapping yet and so the decision to place a fistula or graft will need to be made in the operating room.  I discussed the risks of the procedure including the need for future interventions, possible access failure and steal syndrome.  He wishes to proceed.  He will need to be n.p.o. after midnight.  He should not have any IVs in his left arm. ? ?Randy Ross ?

## 2021-12-09 NOTE — Progress Notes (Signed)
Admit: 12/08/2021 LOS: 1  45M new ESRD, uremic, starting iHD  Subjective:  S/p R IJ TDC earlier today For HD#1 later today Family at bedside Discussed need for AVF, they are in agreement to proceed Renal US /wo obstruction   Scheduled Meds:  amLODipine  10 mg Oral QHS   carvedilol  3.125 mg Oral BID WC   Chlorhexidine Gluconate Cloth  6 each Topical Q0600   cholecalciferol  1,000 Units Oral Daily   fentaNYL       heparin  5,000 Units Subcutaneous Q8H   heparin sodium (porcine)       hydrALAZINE  25 mg Oral Q8H   insulin aspart  0-6 Units Subcutaneous TID WC   insulin glargine-yfgn  10 Units Subcutaneous QHS   isosorbide mononitrate  30 mg Oral Daily   lidocaine-EPINEPHrine       midazolam       sevelamer carbonate  800 mg Oral TID WC   Continuous Infusions:  sodium chloride     sodium chloride     ceFAZolin      ceFAZolin (ANCEF) IV     PRN Meds:.sodium chloride, sodium chloride, alteplase, heparin, lidocaine (PF), lidocaine-prilocaine, pentafluoroprop-tetrafluoroeth  Current Labs: reviewed   Latest Reference Range & Units 12/09/21 09:18  Saturation Ratios 17.9 - 39.5 % 50 (H)  Ferritin 24 - 336 ng/mL 253  (H): Data is abnormally high  Physical Exam:  Blood pressure (!) 162/78, pulse 70, temperature 98 F (36.7 C), resp. rate 15, SpO2 100 %. NAD RRR no rub CTAB nl WOB S/nt/nd R IJ TDC bandaged/ c/d/I Nl mood affect 2+ LEE Nonfocal  A 45M new ESRD, uremic  New ESRD S/p [facility] 12/09/21 R IJ with IR VVS consulted for AVF For HD#1 later today CLIP in process HTN / Hypervolemia: should improve with HD / UF ANemia of CKD,  TSAT 50%, not likley to respond to Fe. Start ESA CKD-BMD: cont sevelamer, trend P.  PTH pending.  Ca low, not surprising.  High Ca bath, cont C3 pending PTH DM2, cause of his ESRD   P As above Medication Issues; Preferred narcotic agents for pain control are hydromorphone, fentanyl, and methadone. Morphine should not be used.  Baclofen  should be avoided Avoid oral sodium phosphate and magnesium citrate based laxatives / bowel preps    Pearson Grippe MD 12/09/2021, 3:30 PM  Recent Labs  Lab 12/08/21 1444 12/08/21 1725 12/09/21 0538 12/09/21 0918  NA 138 138 139  --   K 4.2 4.2 4.5  --   CL 108 107 108  --   CO2 18*  --  16*  --   GLUCOSE 109* 145* 87  --   BUN 106* 113* 107*  --   CREATININE 16.51* >18.00* 16.31*  --   CALCIUM 6.3*  --  6.5*  --   PHOS  --   --   --  9.4*   Recent Labs  Lab 12/08/21 1444 12/08/21 1725 12/09/21 0538  WBC 12.4*  --  13.3*  NEUTROABS 9.8*  --   --   HGB 7.1* 7.5* 7.1*  HCT 21.1* 22.0* 21.2*  MCV 61.5*  --  61.1*  PLT 330  --  328

## 2021-12-09 NOTE — Procedures (Signed)
Interventional Radiology Procedure Note ? ?Procedure: Tunneled hemodialysis catheter placement ? ?Findings: Please refer to procedural dictation for full description. 14.5 Fr right IJ, 19 cm tunneled HD catheter, tip in the right atrium. ? ?Complications: None immediate ? ?Estimated Blood Loss: < 5 mL ? ?Recommendations: ?Catheter ready for immediate use. ? ? ?Ruthann Cancer, MD ?Pager: [phone] ? ? ? ?

## 2021-12-09 NOTE — Progress Notes (Signed)
?                                  PROGRESS NOTE                                             ?                                                                                                                     ?                                         ? ? Patient Demographics:  ? ? Randy Ross, is a 51 y.o. male, DOB - 06-04-1971, RAQ:[id number] ? ?Outpatient Primary MD for the patient is Sueanne Margarita, DO    LOS - 1  Admit date - 12/08/2021   ? ?Chief Complaint  ?Patient presents with  ? Acute Renal Failure  ?    ? ?Brief Narrative (HPI from H&P)  - 51 y.o. male with medical history significant of CKD stage V initially diagnosed in 2020, IgA MGUS for which she follows with oncologist in [place], chronic microcytic anemia, IDDM, HTN, diabetic neuropathy, he unfortunately could not follow-up with his nephrologist for a while during [place].  In October 2022 when he saw his nephrologist back he was told that he would require dialysis soon.  He had a routine appointment with his nephrologist on 12/08/2021 and upon lab work was requested to come to the hospital to initiate HD. ? ? Subjective:  ? ? Jahmarion Dun today has, No headache, No chest pain, No abdominal pain - No Nausea, No new weakness tingling or numbness, no SOB. ? ? Assessment  & Plan :  ? ? ?  ? ?CKD 5 now has progressed to ESRD.  Will require initiation of tunneled HD catheter placement and dialysis on 12/09/2021, IR and nephrology on board.  Remarkably symptom-free will continue to monitor. ? ?2.  Hypomagnesemia and hypocalcemia.  Replaced. ? ?3.  MGUS with chronic proteinuria.  Follow-up with hematology oncology at [facility] post discharge, nephrology consulted here. ? ?4.  Acute on chronic anemia.  Chronic anemia due to underlying MGUS, will check anemia panel, type screen and monitor.  Will transfuse if it drops below 7. ? ?5.  Hypoalbuminemia due to proteinuria and MGUS.  Protein supplementation. ? ?6.   Hypertension.  Continue Norvasc, Imdur added Coreg for better control. ? ?7.  Obesity.  BMI 30.  Follow with PCP for weight loss. ? ?8.  DM type II.  On Lantus and sliding scale will monitor and adjust. ? ?Lab Results  ?Component Value Date  ? HGBA1C 5.9 (H) 12/08/2021  ? ?CBG (last 3)  ?  Recent Labs  ?  12/09/21 ?0823  ?GLUCAP 73  ? ? ? ?   ? ?Condition - Guarded ? ?Family Communication  :  wife bedside ? ?Code Status :  Full ? ?Consults  :  IR, Renal ? ?PUD Prophylaxis :  ? ? Procedures  :    ? ?Tunneled HD catheter requested by IR on 12/09/2021.   ? ?[place] - There is no hydronephrosis. There is increased cortical echogenicity in the kidneys suggesting possible medical renal disease ? ?   ? ?Disposition Plan  :   ? ?Status is: Inpatient ? ?DVT Prophylaxis  :   ? ?heparin injection 5,000 Units Start: 12/08/21 2200 ? ? ?Lab Results  ?Component Value Date  ? PLT 328 12/09/2021  ? ? ?Diet :  ?Diet Order   ? ?       ?  Diet NPO time specified Except for: Sips with Meds  Diet effective midnight       ?  ? ?  ?  ? ?  ?  ? ?Inpatient Medications ? ?Scheduled Meds: ? amLODipine  10 mg Oral QHS  ? carvedilol  3.125 mg Oral BID WC  ? Chlorhexidine Gluconate Cloth  6 each Topical Q0600  ? cholecalciferol  1,000 Units Oral Daily  ? heparin  5,000 Units Subcutaneous Q8H  ? hydrALAZINE  25 mg Oral Q8H  ? insulin aspart  0-6 Units Subcutaneous TID WC  ? insulin glargine-yfgn  10 Units Subcutaneous QHS  ? isosorbide mononitrate  30 mg Oral Daily  ? sevelamer carbonate  800 mg Oral TID WC  ? ?Continuous Infusions: ? sodium chloride    ? sodium chloride    ? calcium gluconate    ? magnesium sulfate bolus IVPB    ? ?PRN Meds:.sodium chloride, sodium chloride, alteplase, heparin, lidocaine (PF), lidocaine-prilocaine, pentafluoroprop-tetrafluoroeth ? ?Antibiotics  :   ? ?Anti-infectives (From admission, onward)  ? ? None  ? ?  ? ? ? Time Spent in minutes  30 ? ? ?Lala Lund M.D on 12/09/2021 at 8:53 AM ? ?To page go to [email or web address]   ? ?[facility] -  Office  [phone] ? ?See all Orders from today for further details ? ? ? Objective:  ? ?Vitals:  ? 12/08/21 2330 12/09/21 0315 12/09/21 0515 12/09/21 0530  ?BP:  (!) 165/85 (!) 182/93 (!) 178/94  ?Pulse: 80 77 84 87  ?Resp: 16   20  ?Temp:      ?TempSrc:      ?SpO2: 100% 99% 99% 100%  ? ? ?Wt Readings from Last 3 Encounters:  ?08/09/19 82.2 kg  ? ? ?No intake or output data in the 24 hours ending 12/09/21 0853 ? ? ?Physical Exam ? ?Awake Alert, No new F.N deficits, Normal affect ?[place].AT,PERRAL ?Supple Neck, No JVD,   ?Symmetrical Chest wall movement, Good air movement bilaterally, CTAB ?RRR,No Gallops,Rubs or new Murmurs,  ?+ve B.Sounds, Abd Soft, No tenderness,   ?No Cyanosis, Clubbing or edema  ?   ? ? Data Review:  ? ? ?CBC ?Recent Labs  ?Lab 12/08/21 ?1444 12/08/21 ?1725 12/09/21 ?8295  ?WBC 12.4*  --  13.3*  ?HGB 7.1* 7.5* 7.1*  ?HCT 21.1* 22.0* 21.2*  ?PLT 330  --  328  ?MCV 61.5*  --  61.1*  ?MCH 20.7*  --  20.5*  ?MCHC 33.6  --  33.5  ?RDW 21.2*  --  21.2*  ?LYMPHSABS 1.3  --   --   ?MONOABS 0.7  --   --   ?  EOSABS 0.4  --   --   ?BASOSABS 0.0  --   --   ? ? ?Electrolytes ?Recent Labs  ?Lab 12/08/21 ?1444 12/08/21 ?1725 12/08/21 ?2020 12/09/21 ?1751  ?NA 138 138  --  139  ?K 4.2 4.2  --  4.5  ?CL 108 107  --  108  ?CO2 18*  --   --  16*  ?GLUCOSE 109* 145*  --  87  ?BUN 106* 113*  --  107*  ?CREATININE 16.51* >18.00*  --  16.31*  ?CALCIUM 6.3*  --   --  6.5*  ?AST 15  --   --  18  ?ALT 25  --   --  29  ?ALKPHOS 61  --   --  56  ?BILITOT 0.3  --   --  0.1*  ?ALBUMIN 2.5*  --   --  2.4*  ?MG  --   --   --  1.5*  ?HGBA1C  --   --  5.9*  --   ? ? ?------------------------------------------------------------------------------------------------------------------ ?No results for input(s): CHOL, HDL, LDLCALC, TRIG, CHOLHDL, LDLDIRECT in the last 72 hours. ? ?Lab Results  ?Component Value Date  ? HGBA1C 5.9 (H) 12/08/2021  ? ? ?No results for input(s): TSH, T4TOTAL, T3FREE, THYROIDAB in the  last 72 hours. ? ?Invalid input(s): FREET3 ?------------------------------------------------------------------------------------------------------------------ ?ID Labs ?Recent Labs  ?Lab 12/08/21 ?1444 12/08/21 ?1725 12/09/21 ?0258  ?WBC 12.4*  --  13.3*  ?PLT 330  --  328  ?CREATININE 16.51* >18.00* 16.31*  ? ?Cardiac Enzymes ?No results for input(s): CKMB, TROPONINI, MYOGLOBIN in the last 168 hours. ? ?Invalid input(s): CK ? ?Micro Results ?No results found for this or any previous visit (from the past 240 hour(s)). ? ?Radiology Reports ?US RENAL ? ?Result Date: 12/08/2021 ?CLINICAL DATA:  Renal dysfunction EXAM: RENAL / URINARY TRACT ULTRASOUND COMPLETE COMPARISON:  None. FINDINGS: Right Kidney: Renal measurements: 9 x 4.1 x 4.3 cm = volume: 83.57 mL. There is increased cortical echogenicity. There is no hydronephrosis. Left Kidney: Renal measurements: 8.7 x 5.3 x 4.5 cm = volume: 109.98 mL. There is increased cortical echogenicity. There is no hydronephrosis. Bladder: Appears normal for degree of bladder distention. Other: None. IMPRESSION: There is no hydronephrosis. There is increased cortical echogenicity in the kidneys suggesting possible medical renal disease. Electronically Signed   By: Elmer Picker M.D.   On: 12/08/2021 18:16  ? ?DG Chest Port 1 View ? ?Result Date: 12/09/2021 ?CLINICAL DATA:  51 year old male with shortness of breath. EXAM: PORTABLE CHEST 1 VIEW COMPARISON:  None. FINDINGS: Portable AP semi upright view at 0624 hours. Mildly rotated to the right. Heart size at the upper limits of normal. Somewhat low lung volumes. Other mediastinal contours are within normal limits. Visualized tracheal air column is within normal limits. Allowing for portable technique the lungs are clear. No pneumothorax or pleural effusion. No osseous abnormality identified. Negative visible bowel gas. IMPRESSION: No acute cardiopulmonary abnormality. Electronically Signed   By: Genevie Ann M.D.   On: 12/09/2021 07:19     ? ? ?

## 2021-12-09 NOTE — ED Notes (Signed)
Pt transported to IR 

## 2021-12-10 ENCOUNTER — Inpatient Hospital Stay (HOSPITAL_COMMUNITY): Payer: Medicaid Other | Admitting: Certified Registered"

## 2021-12-10 ENCOUNTER — Encounter (HOSPITAL_COMMUNITY): Payer: Self-pay | Admitting: Internal Medicine

## 2021-12-10 ENCOUNTER — Encounter (HOSPITAL_COMMUNITY)
Admission: EM | Disposition: A | Discharge status: Home / Self Care | Payer: Self-pay | Source: Home / Self Care | Attending: Internal Medicine

## 2021-12-10 ENCOUNTER — Encounter (HOSPITAL_COMMUNITY): Payer: Self-pay

## 2021-12-10 ENCOUNTER — Other Ambulatory Visit: Payer: Self-pay

## 2021-12-10 DIAGNOSIS — N186 End stage renal disease: Secondary | ICD-10-CM

## 2021-12-10 DIAGNOSIS — I120 Hypertensive chronic kidney disease with stage 5 chronic kidney disease or end stage renal disease: Secondary | ICD-10-CM

## 2021-12-10 DIAGNOSIS — E1122 Type 2 diabetes mellitus with diabetic chronic kidney disease: Secondary | ICD-10-CM

## 2021-12-10 DIAGNOSIS — Z992 Dependence on renal dialysis: Secondary | ICD-10-CM

## 2021-12-10 HISTORY — PX: AV FISTULA PLACEMENT: SHX1204

## 2021-12-10 LAB — COMPREHENSIVE METABOLIC PANEL
ALT: 25 U/L (ref 0–44)
AST: 21 U/L (ref 15–41)
Albumin: 2.1 g/dL — ABNORMAL LOW (ref 3.5–5.0)
Alkaline Phosphatase: 53 U/L (ref 38–126)
Anion gap: 9 (ref 5–15)
BUN: 68 mg/dL — ABNORMAL HIGH (ref 6–20)
CO2: 22 mmol/L (ref 22–32)
Calcium: 6.6 mg/dL — ABNORMAL LOW (ref 8.9–10.3)
Chloride: 108 mmol/L (ref 98–111)
Creatinine, Ser: 12.02 mg/dL — ABNORMAL HIGH (ref 0.61–1.24)
GFR, Estimated: 5 mL/min — ABNORMAL LOW (ref >60)
Glucose, Bld: 109 mg/dL — ABNORMAL HIGH (ref 70–99)
Potassium: 3.9 mmol/L (ref 3.5–5.1)
Sodium: 139 mmol/L (ref 135–145)
Total Bilirubin: 0.1 mg/dL — ABNORMAL LOW (ref 0.3–1.2)
Total Protein: 5.4 g/dL — ABNORMAL LOW (ref 6.5–8.1)

## 2021-12-10 LAB — MAGNESIUM: Magnesium: 1.7 mg/dL (ref 1.7–2.4)

## 2021-12-10 LAB — CBC WITH DIFFERENTIAL/PLATELET
Abs Immature Granulocytes: 0.05 10*3/uL (ref 0.00–0.07)
Basophils Absolute: 0 10*3/uL (ref 0.0–0.1)
Basophils Relative: 0 %
Eosinophils Absolute: 0.3 10*3/uL (ref 0.0–0.5)
Eosinophils Relative: 2 %
HCT: 19.1 % — ABNORMAL LOW (ref 39.0–52.0)
Hemoglobin: 6.3 g/dL — CL (ref 13.0–17.0)
Immature Granulocytes: 0 %
Lymphocytes Relative: 9 %
Lymphs Abs: 1.1 10*3/uL (ref 0.7–4.0)
MCH: 20.2 pg — ABNORMAL LOW (ref 26.0–34.0)
MCHC: 33 g/dL (ref 30.0–36.0)
MCV: 61.2 fL — ABNORMAL LOW (ref 80.0–100.0)
Monocytes Absolute: 1 10*3/uL (ref 0.1–1.0)
Monocytes Relative: 8 %
Neutro Abs: 9.9 10*3/uL — ABNORMAL HIGH (ref 1.7–7.7)
Neutrophils Relative %: 81 %
Platelets: 309 10*3/uL (ref 150–400)
RBC: 3.12 MIL/uL — ABNORMAL LOW (ref 4.22–5.81)
RDW: 21 % — ABNORMAL HIGH (ref 11.5–15.5)
WBC: 12.3 10*3/uL — ABNORMAL HIGH (ref 4.0–10.5)
nRBC: 0 % (ref 0.0–0.2)

## 2021-12-10 LAB — MICROALBUMIN / CREATININE URINE RATIO
Creatinine, Urine: 50.9 mg/dL
Microalb Creat Ratio: 7033 mg/g creat — ABNORMAL HIGH (ref 0–29)
Microalb, Ur: 3579.9 ug/mL — ABNORMAL HIGH

## 2021-12-10 LAB — PREPARE RBC (CROSSMATCH)

## 2021-12-10 LAB — HEMOGLOBIN AND HEMATOCRIT, BLOOD
HCT: 21.2 % — ABNORMAL LOW (ref 39.0–52.0)
Hemoglobin: 7.1 g/dL — ABNORMAL LOW (ref 13.0–17.0)

## 2021-12-10 LAB — GLUCOSE, CAPILLARY
Glucose-Capillary: 97 mg/dL (ref 70–99)
Glucose-Capillary: 80 mg/dL (ref 70–99)
Glucose-Capillary: 79 mg/dL (ref 70–99)
Glucose-Capillary: 80 mg/dL (ref 70–99)
Glucose-Capillary: 81 mg/dL (ref 70–99)
Glucose-Capillary: 145 mg/dL — ABNORMAL HIGH (ref 70–99)

## 2021-12-10 LAB — CALCIUM, IONIZED: Calcium, Ionized, Serum: 3.4 mg/dL — ABNORMAL LOW (ref 4.5–5.6)

## 2021-12-10 LAB — SURGICAL PCR SCREEN
MRSA, PCR: NEGATIVE
Staphylococcus aureus: NEGATIVE

## 2021-12-10 LAB — PARATHYROID HORMONE, INTACT (NO CA): PTH: 266 pg/mL — ABNORMAL HIGH (ref 15–65)

## 2021-12-10 LAB — HEPATITIS B SURFACE ANTIGEN

## 2021-12-10 SURGERY — ARTERIOVENOUS (AV) FISTULA CREATION
Anesthesia: Monitor Anesthesia Care | Site: Arm Lower | Laterality: Left

## 2021-12-10 MED ORDER — SODIUM CHLORIDE 0.9 % IV SOLN: INTRAVENOUS | Status: DC

## 2021-12-10 MED ORDER — ACETAMINOPHEN 500 MG PO TABS
500.0000 mg | ORAL_TABLET | Freq: Four times a day (QID) | ORAL | Status: DC | PRN
  Administered 2021-12-14: 500 mg via ORAL
  Filled 2021-12-10: qty 1

## 2021-12-10 MED ORDER — LIDOCAINE 2% (20 MG/ML) 5 ML SYRINGE
INTRAMUSCULAR | Status: DC | PRN
  Administered 2021-12-10: 100 mg via INTRAVENOUS

## 2021-12-10 MED ORDER — CALCIUM ACETATE (PHOS BINDER) 667 MG PO CAPS
1334.0000 mg | ORAL_CAPSULE | Freq: Three times a day (TID) | ORAL | Status: DC
  Administered 2021-12-10 – 2021-12-15 (×13): 1334 mg via ORAL
  Filled 2021-12-10 (×13): qty 2

## 2021-12-10 MED ORDER — ORAL CARE MOUTH RINSE: 15.0000 mL | Freq: Once | OROMUCOSAL | Status: AC

## 2021-12-10 MED ORDER — HEPARIN 6000 UNIT IRRIGATION SOLUTION
Status: AC
  Filled 2021-12-10: qty 500

## 2021-12-10 MED ORDER — ACETAMINOPHEN 160 MG/5ML PO SOLN: 1000.0000 mg | Freq: Once | ORAL | Status: DC | PRN

## 2021-12-10 MED ORDER — FENTANYL CITRATE (PF) 100 MCG/2ML IJ SOLN: 25.0000 ug | INTRAMUSCULAR | Status: DC | PRN

## 2021-12-10 MED ORDER — MIDAZOLAM HCL 2 MG/2ML IJ SOLN
INTRAMUSCULAR | Status: AC
  Filled 2021-12-10: qty 2

## 2021-12-10 MED ORDER — 0.9 % SODIUM CHLORIDE (POUR BTL) OPTIME
TOPICAL | Status: DC | PRN
  Administered 2021-12-10: 1000 mL

## 2021-12-10 MED ORDER — FENTANYL CITRATE (PF) 250 MCG/5ML IJ SOLN
INTRAMUSCULAR | Status: DC | PRN
  Administered 2021-12-10 (×2): 50 ug via INTRAVENOUS

## 2021-12-10 MED ORDER — LIDOCAINE-EPINEPHRINE 2 %-1:100000 IJ SOLN
INTRAMUSCULAR | Status: DC | PRN
  Administered 2021-12-10: 10 mL via PERINEURAL

## 2021-12-10 MED ORDER — MEPIVACAINE HCL (PF) 2 % IJ SOLN
INTRAMUSCULAR | Status: DC | PRN
  Administered 2021-12-10: 15 mL

## 2021-12-10 MED ORDER — ACETAMINOPHEN 500 MG PO TABS: 1000.0000 mg | ORAL_TABLET | Freq: Once | ORAL | Status: DC | PRN

## 2021-12-10 MED ORDER — PROPOFOL 500 MG/50ML IV EMUL
INTRAVENOUS | Status: DC | PRN
  Administered 2021-12-10: 25 ug/kg/min via INTRAVENOUS

## 2021-12-10 MED ORDER — LIDOCAINE 2% (20 MG/ML) 5 ML SYRINGE
INTRAMUSCULAR | Status: AC
  Filled 2021-12-10: qty 5

## 2021-12-10 MED ORDER — PROPOFOL 10 MG/ML IV BOLUS
INTRAVENOUS | Status: AC
  Filled 2021-12-10: qty 20

## 2021-12-10 MED ORDER — HEPARIN 6000 UNIT IRRIGATION SOLUTION
Status: DC | PRN
  Administered 2021-12-10: 1

## 2021-12-10 MED ORDER — ACETAMINOPHEN 10 MG/ML IV SOLN: 1000.0000 mg | Freq: Once | INTRAVENOUS | Status: DC | PRN

## 2021-12-10 MED ORDER — HYDRALAZINE HCL 50 MG PO TABS
50.0000 mg | ORAL_TABLET | Freq: Three times a day (TID) | ORAL | Status: DC
  Administered 2021-12-10 – 2021-12-15 (×14): 50 mg via ORAL
  Filled 2021-12-10 (×14): qty 1

## 2021-12-10 MED ORDER — OXYCODONE-ACETAMINOPHEN 5-325 MG PO TABS: 1.0000 | ORAL_TABLET | Freq: Four times a day (QID) | ORAL | Status: DC | PRN

## 2021-12-10 MED ORDER — SODIUM CHLORIDE 0.9 % IV SOLN
INTRAVENOUS | Status: DC | PRN
  Administered 2021-12-10: 1.5 g via INTRAVENOUS

## 2021-12-10 MED ORDER — GLYCOPYRROLATE 0.2 MG/ML IJ SOLN
INTRAMUSCULAR | Status: DC | PRN
  Administered 2021-12-10: .1 mg via INTRAVENOUS

## 2021-12-10 MED ORDER — CHLORHEXIDINE GLUCONATE 0.12 % MT SOLN
15.0000 mL | Freq: Once | OROMUCOSAL | Status: AC
  Administered 2021-12-10: 15 mL via OROMUCOSAL
  Filled 2021-12-10: qty 15

## 2021-12-10 MED ORDER — SODIUM CHLORIDE 0.9% IV SOLUTION: Freq: Once | INTRAVENOUS | Status: AC

## 2021-12-10 MED ORDER — HYDRALAZINE HCL 20 MG/ML IJ SOLN: 10.0000 mg | Freq: Four times a day (QID) | INTRAMUSCULAR | Status: DC | PRN

## 2021-12-10 MED ORDER — CALCITRIOL 0.25 MCG PO CAPS
0.2500 ug | ORAL_CAPSULE | Freq: Every day | ORAL | Status: DC
  Administered 2021-12-10 – 2021-12-15 (×6): 0.25 ug via ORAL
  Filled 2021-12-10 (×6): qty 1

## 2021-12-10 MED ORDER — INSULIN ASPART 100 UNIT/ML IJ SOLN: 0.0000 [IU] | INTRAMUSCULAR | Status: DC | PRN

## 2021-12-10 MED ORDER — DIPHENHYDRAMINE HCL 50 MG/ML IJ SOLN: 25.0000 mg | Freq: Four times a day (QID) | INTRAMUSCULAR | Status: DC | PRN

## 2021-12-10 MED ORDER — FENTANYL CITRATE (PF) 250 MCG/5ML IJ SOLN
INTRAMUSCULAR | Status: AC
  Filled 2021-12-10: qty 5

## 2021-12-10 MED ORDER — GLYCOPYRROLATE PF 0.2 MG/ML IJ SOSY
PREFILLED_SYRINGE | INTRAMUSCULAR | Status: AC
  Filled 2021-12-10: qty 1

## 2021-12-10 MED ORDER — LIDOCAINE-EPINEPHRINE (PF) 1 %-1:200000 IJ SOLN
INTRAMUSCULAR | Status: AC
  Filled 2021-12-10: qty 30

## 2021-12-10 MED ORDER — MIDAZOLAM HCL 2 MG/2ML IJ SOLN
INTRAMUSCULAR | Status: DC | PRN
  Administered 2021-12-10 (×2): 1 mg via INTRAVENOUS

## 2021-12-10 SURGICAL SUPPLY — 32 items
ARMBAND PINK RESTRICT EXTREMIT (MISCELLANEOUS) ×2 IMPLANT
BAG COUNTER SPONGE SURGICOUNT (BAG) ×2 IMPLANT
CANISTER SUCT 3000ML PPV (MISCELLANEOUS) ×2 IMPLANT
CLIP LIGATING EXTRA MED SLVR (CLIP) ×2 IMPLANT
CLIP LIGATING EXTRA SM BLUE (MISCELLANEOUS) ×3 IMPLANT
COVER PROBE W GEL 5X96 (DRAPES) IMPLANT
COVER SURGICAL LIGHT HANDLE (MISCELLANEOUS) ×1 IMPLANT
DERMABOND ADVANCED (GAUZE/BANDAGES/DRESSINGS) ×1
DERMABOND ADVANCED .7 DNX12 (GAUZE/BANDAGES/DRESSINGS) ×1 IMPLANT
ELECT REM PT RETURN 9FT ADLT (ELECTROSURGICAL) ×2
ELECTRODE REM PT RTRN 9FT ADLT (ELECTROSURGICAL) ×1 IMPLANT
GLOVE SURG ENC MOIS LTX SZ7.5 (GLOVE) ×2 IMPLANT
GOWN STRL REUS W/ TWL LRG LVL3 (GOWN DISPOSABLE) ×2 IMPLANT
GOWN STRL REUS W/ TWL XL LVL3 (GOWN DISPOSABLE) ×1 IMPLANT
GOWN STRL REUS W/TWL LRG LVL3 (GOWN DISPOSABLE) ×4
GOWN STRL REUS W/TWL XL LVL3 (GOWN DISPOSABLE) ×2
INSERT FOGARTY SM (MISCELLANEOUS) IMPLANT
KIT BASIN OR (CUSTOM PROCEDURE TRAY) ×2 IMPLANT
KIT TURNOVER KIT B (KITS) ×2 IMPLANT
NS IRRIG 1000ML POUR BTL (IV SOLUTION) ×2 IMPLANT
PACK CV ACCESS (CUSTOM PROCEDURE TRAY) ×2 IMPLANT
PAD ARMBOARD 7.5X6 YLW CONV (MISCELLANEOUS) ×4 IMPLANT
SLING ARM FOAM STRAP LRG (SOFTGOODS) ×1 IMPLANT
SUT MNCRL AB 4-0 PS2 18 (SUTURE) ×2 IMPLANT
SUT PROLENE 6 0 BV (SUTURE) ×3 IMPLANT
SUT SILK 2 0 (SUTURE) ×2
SUT SILK 2-0 18XBRD TIE 12 (SUTURE) IMPLANT
SUT VIC AB 3-0 SH 27 (SUTURE) ×2
SUT VIC AB 3-0 SH 27X BRD (SUTURE) ×1 IMPLANT
TOWEL GREEN STERILE (TOWEL DISPOSABLE) ×2 IMPLANT
UNDERPAD 30X36 HEAVY ABSORB (UNDERPADS AND DIAPERS) ×2 IMPLANT
WATER STERILE IRR 1000ML POUR (IV SOLUTION) ×2 IMPLANT

## 2021-12-10 NOTE — Anesthesia Procedure Notes (Addendum)
Anesthesia Regional Block: Supraclavicular block  ? ?Pre-Anesthetic Checklist: , timeout performed,  Correct Patient, Correct Site, Correct Laterality,  Correct Procedure, Correct Position, site marked,  Risks and benefits discussed,  Surgical consent,  Pre-op evaluation,  At surgeon's request and post-op pain management ? ?Laterality: Left and Lower ? ?Prep: chloraprep     ?  ?Needles:  ?Injection technique: Single-shot ? ?  ? ? ?Needle Length: 5cm  ?Needle Gauge: 22  ? ? ? ?Additional Needles: ?Arrow? StimuQuik? ECHO Echogenic Stimulating PNB Needle ? ?Procedures:,,,, ultrasound used (permanent image in chart),,    ?Narrative:  ?Start time: 12/10/2021 9:49 AM ?End time: 12/10/2021 9:56 AM ?Injection made incrementally with aspirations every 5 mL. ? ?Performed by: Personally  ?Anesthesiologist: Oleta Mouse, MD ? ? ? ? ?

## 2021-12-10 NOTE — Discharge Instructions (Signed)
? ?[facility] [place] ? ?Discharge Instructions ? ?AV Fistula or Graft Surgery for Dialysis Access ? ?Please refer to the following instructions for your post-procedure care. Your surgeon or physician assistant will discuss any changes with you. ? ?Activity ? ?You may drive the day following your surgery, if you are comfortable and no longer taking prescription pain medication. Resume full activity as the soreness in your incision resolves. ? ?Bathing/Showering ? ?You may shower after you go home. Keep your incision dry for 48 hours. Do not soak in a bathtub, hot tub, or swim until the incision heals completely. You may not shower if you have a hemodialysis catheter. ? ?Incision Care ? ?Clean your incision with mild soap and water after 48 hours. Pat the area dry with a clean towel. You do not need a bandage unless otherwise instructed. Do not apply any ointments or creams to your incision. You may have skin glue on your incision. Do not peel it off. It will come off on its own in about one week. Your arm may swell a bit after surgery. To reduce swelling use pillows to elevate your arm so it is above your heart. Your doctor will tell you if you need to lightly wrap your arm with an ACE bandage. ? ?Diet ? ?Resume your normal diet. There are not special food restrictions following this procedure. In order to heal from your surgery, it is CRITICAL to get adequate nutrition. Your body requires vitamins, minerals, and protein. Vegetables are the best source of vitamins and minerals. Vegetables also provide the perfect balance of protein. Processed food has little nutritional value, so try to avoid this. ? ?Medications ? ?Resume taking all of your medications. If your incision is causing pain, you may take over-the counter pain relievers such as acetaminophen (Tylenol). If you were prescribed a stronger pain medication, please be aware these medications can cause nausea and constipation. Prevent  nausea by taking the medication with a snack or meal. Avoid constipation by drinking plenty of fluids and eating foods with high amount of fiber, such as fruits, vegetables, and grains.  ?Do not take Tylenol if you are taking prescription pain medications. ? ?Follow up ?Your surgeon may want to see you in the office following your access surgery. If so, this will be arranged at the time of your surgery. ? ?Please call us immediately for any of the following conditions: ? ?Increased pain, redness, drainage (pus) from your incision site ?Fever of 101 degrees or higher ?Severe or worsening pain at your incision site ?Hand pain or numbness. ? ?Reduce your risk of vascular disease: ? ?Stop smoking. If you would like help, call [facility] at 1-800-QUIT-NOW (215)812-4131) or [place] at [phone] ? ?Manage your cholesterol ?Maintain a desired weight ?Control your diabetes ?Keep your blood pressure down ? ?Dialysis ? ?It will take several weeks to several months for your new dialysis access to be ready for use. Your surgeon will determine when it is okay to use it. Your nephrologist will continue to direct your dialysis. You can continue to use your Permcath until your new access is ready for use. ? ? ?12/10/2021 ?Randy Ross ?[id number] ?1971/04/16 ? ?Surgeon(s): ?Waynetta Sandy, MD ? ?Procedure(s): ?LEFT RADIAL CEPHALIC  ARTERIOVENOUS (AV) FISTULA ? ? May stick graft immediately  ? May stick graft on designated area only:   ?X Do not stick left radiocephalic AV fistula for 12 weeks  ? ? ?If you have any questions, please call the  office at [phone]. ?

## 2021-12-10 NOTE — Progress Notes (Signed)
PROGRESS NOTE                                                                                                                                                                                                             Patient Demographics:    Randy Ross, is a 51 y.o. male, DOB - [birth year], LNL:[id number]  Outpatient Primary MD for the patient is Sueanne Margarita, DO    LOS - 2  Admit date - 12/08/2021    Chief Complaint  Patient presents with   Acute Renal Failure       Brief Narrative (HPI from H&P)  - 51 y.o. male with medical history significant of CKD stage V initially diagnosed in 2020, IgA MGUS for which she follows with oncologist in [place], chronic microcytic anemia, IDDM, HTN, diabetic neuropathy, he unfortunately could not follow-up with his nephrologist for a while during [place].  In October 2022 when he saw his nephrologist back he was told that he would require dialysis soon.  He had a routine appointment with his nephrologist on 12/08/2021 and upon lab work was requested to come to the hospital to initiate HD.   Subjective:   Patient in bed, appears comfortable, denies any headache, no fever, no chest pain or pressure, no shortness of breath , no abdominal pain. No new focal weakness.   Assessment  & Plan :     CKD 5 now has progressed to ESRD.  He underwent right tunneled HD catheter placement on 12/09/2021 with start of HD treatments, vascular surgery and nephrology on board, undergoing possible left arm AV fistula placement on 12/10/2021.  2.  Hypomagnesemia and hypocalcemia.  Replaced.  Will defer electrolytes to nephrology team.  3.  MGUS with chronic proteinuria.  Follow-up with hematology oncology at [facility] post discharge, nephrology consulted here.  4.  Acute on chronic anemia.  He has anemia of chronic disease due to underlying MGUS and ESRD, 1 unit packed RBC transfusion on 12/10/2021,  no signs of active bleeding.  Will monitor.  5.  Hypoalbuminemia due to proteinuria and MGUS.  Protein supplementation.  6.  Hypertension.  Continue home addition of Norvasc, Coreg and Imdur, hydralazine dose increased and as needed hydralazine added for better control.  With HD blood pressure should improve.  7.  Obesity.  BMI 30.  Follow with PCP for weight  loss.  8.  DM type II.  On Lantus and sliding scale will monitor and adjust.  Lab Results  Component Value Date   HGBA1C 5.9 (H) 12/08/2021   CBG (last 3)  Recent Labs    12/09/21 1636 12/09/21 2126 12/10/21 0734  GLUCAP 207* 185* 97         Condition - Fair  Family Communication  :  wife bedside on 12/09/2021 and 12/10/2021  Code Status :  Full  Consults  :  IR, Renal vascular surgery  PUD Prophylaxis :    Procedures  :     OR today for left arm avf vs avg  Tunneled HD catheter requested by IR on 12/09/2021.    [place] - There is no hydronephrosis. There is increased cortical echogenicity in the kidneys suggesting possible medical renal disease      Disposition Plan  :    Status is: Inpatient  DVT Prophylaxis  :    heparin injection 5,000 Units Start: 12/08/21 2200   Lab Results  Component Value Date   PLT 309 12/10/2021    Diet :  Diet Order             Diet NPO time specified Except for: Sips with Meds  Diet effective midnight                    Inpatient Medications  Scheduled Meds:  [MAR Hold] amLODipine  10 mg Oral QHS   [MAR Hold] carvedilol  3.125 mg Oral BID WC   [MAR Hold] Chlorhexidine Gluconate Cloth  6 each Topical Q0600   [MAR Hold] cholecalciferol  1,000 Units Oral Daily   [MAR Hold] heparin  5,000 Units Subcutaneous Q8H   hydrALAZINE  50 mg Oral Q8H   [MAR Hold] insulin aspart  0-6 Units Subcutaneous TID WC   [MAR Hold] insulin glargine-yfgn  10 Units Subcutaneous QHS   [MAR Hold] isosorbide mononitrate  30 mg Oral Daily   [MAR Hold] sevelamer carbonate  800 mg Oral TID WC    Continuous Infusions:  [MAR Hold] sodium chloride     [MAR Hold] sodium chloride     sodium chloride     [MAR Hold]  ceFAZolin (ANCEF) IV     [MAR Hold] cefUROXime (ZINACEF)  IV     PRN Meds:.[MAR Hold] sodium chloride, [MAR Hold] sodium chloride, [MAR Hold] alteplase, [MAR Hold] diphenhydrAMINE, [MAR Hold] heparin, hydrALAZINE, [MAR Hold] lidocaine (PF), [MAR Hold] lidocaine-prilocaine, [MAR Hold] pentafluoroprop-tetrafluoroeth  Time Spent in minutes  30   Lala Lund M.D on 12/10/2021 at 8:59 AM  To page go to [email or web address]   [facility] -  Office  [phone]  See all Orders from today for further details    Objective:   Vitals:   12/10/21 0732 12/10/21 0800 12/10/21 0835 12/10/21 0836  BP: (!) 179/88 (!) 175/92  (!) 184/83  Pulse: 77 81 76   Resp: '13 14 16   ' Temp: 98.5 F (36.9 C)  98.5 F (36.9 C)   TempSrc: Oral     SpO2: 99% 99% 100%   Weight:      Height:        Wt Readings from Last 3 Encounters:  12/10/21 86 kg  08/09/19 82.2 kg     Intake/Output Summary (Last 24 hours) at 12/10/2021 0859 Last data filed at 12/09/2021 2354 Gross per 24 hour  Intake --  Output 1 ml  Net -1 ml     Physical Exam  Awake Alert,  No new F.N deficits, Normal affect [place].AT,PERRAL Supple Neck, No JVD,   Symmetrical Chest wall movement, Good air movement bilaterally, CTAB RRR,No Gallops,Rubs or new Murmurs,  +ve B.Sounds, Abd Soft, No tenderness,   No Cyanosis, Clubbing or edema       Data Review:    CBC Recent Labs  Lab 12/08/21 1444 12/08/21 1725 12/09/21 0538 12/10/21 0431  WBC 12.4*  --  13.3* 12.3*  HGB 7.1* 7.5* 7.1* 6.3*  HCT 21.1* 22.0* 21.2* 19.1*  PLT 330  --  328 309  MCV 61.5*  --  61.1* 61.2*  MCH 20.7*  --  20.5* 20.2*  MCHC 33.6  --  33.5 33.0  RDW 21.2*  --  21.2* 21.0*  LYMPHSABS 1.3  --   --  1.1  MONOABS 0.7  --   --  1.0  EOSABS 0.4  --   --  0.3  BASOSABS 0.0  --   --  0.0    Electrolytes Recent Labs  Lab  12/08/21 1444 12/08/21 1725 12/08/21 2020 12/09/21 0538 12/10/21 0431  NA 138 138  --  139 139  K 4.2 4.2  --  4.5 3.9  CL 108 107  --  108 108  CO2 18*  --   --  16* 22  GLUCOSE 109* 145*  --  87 109*  BUN 106* 113*  --  107* 68*  CREATININE 16.51* >18.00*  --  16.31* 12.02*  CALCIUM 6.3*  --   --  6.5* 6.6*  AST 15  --   --  18 21  ALT 25  --   --  29 25  ALKPHOS 61  --   --  56 53  BILITOT 0.3  --   --  0.1* 0.1*  ALBUMIN 2.5*  --   --  2.4* 2.1*  MG  --   --   --  1.5* 1.7  HGBA1C  --   --  5.9*  --   --     ------------------------------------------------------------------------------------------------------------------ No results for input(s): CHOL, HDL, LDLCALC, TRIG, CHOLHDL, LDLDIRECT in the last 72 hours.  Lab Results  Component Value Date   HGBA1C 5.9 (H) 12/08/2021    No results for input(s): TSH, T4TOTAL, T3FREE, THYROIDAB in the last 72 hours.  Invalid input(s): FREET3 ------------------------------------------------------------------------------------------------------------------ ID Labs Recent Labs  Lab 12/08/21 1444 12/08/21 1725 12/09/21 0538 12/10/21 0431  WBC 12.4*  --  13.3* 12.3*  PLT 330  --  328 309  CREATININE 16.51* >18.00* 16.31* 12.02*   Cardiac Enzymes No results for input(s): CKMB, TROPONINI, MYOGLOBIN in the last 168 hours.  Invalid input(s): CK   Radiology Reports US RENAL  Result Date: 12/08/2021 CLINICAL DATA:  Renal dysfunction EXAM: RENAL / URINARY TRACT ULTRASOUND COMPLETE COMPARISON:  None. FINDINGS: Right Kidney: Renal measurements: 9 x 4.1 x 4.3 cm = volume: 83.57 mL. There is increased cortical echogenicity. There is no hydronephrosis. Left Kidney: Renal measurements: 8.7 x 5.3 x 4.5 cm = volume: 109.98 mL. There is increased cortical echogenicity. There is no hydronephrosis. Bladder: Appears normal for degree of bladder distention. Other: None. IMPRESSION: There is no hydronephrosis. There is increased cortical  echogenicity in the kidneys suggesting possible medical renal disease. Electronically Signed   By: Elmer Picker M.D.   On: 12/08/2021 18:16   IR Fluoro Guide CV Line Right  Result Date: 12/09/2021 INDICATION: 51 year old male with history of end-stage renal disease requiring central venous access for initiation of hemodialysis. EXAM: TUNNELED CENTRAL VENOUS  HEMODIALYSIS CATHETER PLACEMENT WITH ULTRASOUND AND FLUOROSCOPIC GUIDANCE MEDICATIONS: Ancef 2 gm IV . The antibiotic was given in an appropriate time interval prior to skin puncture. ANESTHESIA/SEDATION: Moderate (conscious) sedation was employed during this procedure. A total of Versed 1 mg and Fentanyl 50 mcg was administered intravenously. Moderate Sedation Time: 13 minutes. The patient's level of consciousness and vital signs were monitored continuously by radiology nursing throughout the procedure under my direct supervision. FLUOROSCOPY TIME:  0 minutes 6 seconds (1 mGy). COMPLICATIONS: None immediate. PROCEDURE: Informed written consent was obtained from the patient after a discussion of the risks, benefits, and alternatives to treatment. Questions regarding the procedure were encouraged and answered. The right neck and chest were prepped with chlorhexidine in a sterile fashion, and a sterile drape was applied covering the operative field. Maximum barrier sterile technique with sterile gowns and gloves were used for the procedure. A timeout was performed prior to the initiation of the procedure. After creating a small venotomy incision, a 21 gauge micropuncture kit was utilized to access the internal jugular vein. Real-time ultrasound guidance was utilized for vascular access including the acquisition of a permanent ultrasound image documenting patency of the accessed vessel. A Rosen wire was advanced to the level of the IVC and the micropuncture sheath was exchanged for an 8 Fr dilator. A 14.5 French tunneled hemodialysis catheter measuring 19  cm from tip to cuff was tunneled in a retrograde fashion from the anterior chest wall to the venotomy incision. Serial dilation was then performed an a peel-away sheath was placed. The catheter was then placed through the peel-away sheath with the catheter tip ultimately positioned within the right atrium. Final catheter positioning was confirmed and documented with a spot radiographic image. The catheter aspirates and flushes normally. The catheter was flushed with appropriate volume heparin dwells. The catheter exit site was secured with a 0-Silk retention suture. The venotomy incision was closed with Dermabond. Sterile dressings were applied. The patient tolerated the procedure well without immediate post procedural complication. IMPRESSION: Successful placement of 19 cm tip to cuff tunneled hemodialysis catheter via the right internal jugular vein with catheter tip terminating within the right atrium. The catheter is ready for immediate use. Ruthann Cancer, MD [facility] Radiology Electronically Signed   By: Ruthann Cancer M.D.   On: 12/09/2021 15:03   IR US Guide Vasc Access Right  Result Date: 12/09/2021 INDICATION: 51 year old male with history of end-stage renal disease requiring central venous access for initiation of hemodialysis. EXAM: TUNNELED CENTRAL VENOUS HEMODIALYSIS CATHETER PLACEMENT WITH ULTRASOUND AND FLUOROSCOPIC GUIDANCE MEDICATIONS: Ancef 2 gm IV . The antibiotic was given in an appropriate time interval prior to skin puncture. ANESTHESIA/SEDATION: Moderate (conscious) sedation was employed during this procedure. A total of Versed 1 mg and Fentanyl 50 mcg was administered intravenously. Moderate Sedation Time: 13 minutes. The patient's level of consciousness and vital signs were monitored continuously by radiology nursing throughout the procedure under my direct supervision. FLUOROSCOPY TIME:  0 minutes 6 seconds (1 mGy). COMPLICATIONS: None  immediate. PROCEDURE: Informed written consent was obtained from the patient after a discussion of the risks, benefits, and alternatives to treatment. Questions regarding the procedure were encouraged and answered. The right neck and chest were prepped with chlorhexidine in a sterile fashion, and a sterile drape was applied covering the operative field. Maximum barrier sterile technique with sterile gowns and gloves were used for the procedure. A timeout was performed prior to the initiation of the procedure. After  creating a small venotomy incision, a 21 gauge micropuncture kit was utilized to access the internal jugular vein. Real-time ultrasound guidance was utilized for vascular access including the acquisition of a permanent ultrasound image documenting patency of the accessed vessel. A Rosen wire was advanced to the level of the IVC and the micropuncture sheath was exchanged for an 8 Fr dilator. A 14.5 French tunneled hemodialysis catheter measuring 19 cm from tip to cuff was tunneled in a retrograde fashion from the anterior chest wall to the venotomy incision. Serial dilation was then performed an a peel-away sheath was placed. The catheter was then placed through the peel-away sheath with the catheter tip ultimately positioned within the right atrium. Final catheter positioning was confirmed and documented with a spot radiographic image. The catheter aspirates and flushes normally. The catheter was flushed with appropriate volume heparin dwells. The catheter exit site was secured with a 0-Silk retention suture. The venotomy incision was closed with Dermabond. Sterile dressings were applied. The patient tolerated the procedure well without immediate post procedural complication. IMPRESSION: Successful placement of 19 cm tip to cuff tunneled hemodialysis catheter via the right internal jugular vein with catheter tip terminating within the right atrium. The catheter is ready for immediate use. Ruthann Cancer, MD  [facility] Radiology Electronically Signed   By: Ruthann Cancer M.D.   On: 12/09/2021 15:03   DG Chest Port 1 View  Result Date: 12/09/2021 CLINICAL DATA:  51 year old male with shortness of breath. EXAM: PORTABLE CHEST 1 VIEW COMPARISON:  None. FINDINGS: Portable AP semi upright view at 0624 hours. Mildly rotated to the right. Heart size at the upper limits of normal. Somewhat low lung volumes. Other mediastinal contours are within normal limits. Visualized tracheal air column is within normal limits. Allowing for portable technique the lungs are clear. No pneumothorax or pleural effusion. No osseous abnormality identified. Negative visible bowel gas. IMPRESSION: No acute cardiopulmonary abnormality. Electronically Signed   By: Genevie Ann M.D.   On: 12/09/2021 07:19

## 2021-12-10 NOTE — Transfer of Care (Signed)
Immediate Anesthesia Transfer of Care Note ? ?Patient: Randy Ross ? ?Procedure(s) Performed: LEFT RADIAL CEPHALIC  ARTERIOVENOUS (AV) FISTULA (Left: Arm Lower) ? ?Patient Location: PACU ? ?Anesthesia Type:MAC combined with regional for post-op pain ? ?Level of Consciousness: drowsy and patient cooperative ? ?Airway & Oxygen Therapy: Patient Spontanous Breathing ? ?Post-op Assessment: Report given to RN and Post -op Vital signs reviewed and stable ? ?Post vital signs: Reviewed and stable ? ?Last Vitals:  ?Vitals Value Taken Time  ?BP 167/82 12/10/21 1108  ?Temp    ?Pulse 83 12/10/21 1108  ?Resp    ?SpO2 98 % 12/10/21 1108  ?Vitals shown include unvalidated device data. ? ?Last Pain:  ?Vitals:  ? 12/10/21 0945  ?TempSrc:   ?PainSc: 0-No pain  ?   ? ?Patients Stated Pain Goal: 3 (12/10/21 0844) ? ?Complications: No notable events documented. ?

## 2021-12-10 NOTE — Progress Notes (Addendum)
Admit: 12/08/2021 ?LOS: 2 ? ?20M new ESRD, uremic, starting iHD ? ?Subjective:  ?HD #1 yesterday: No UF, tol well ?S/p L RC AVF this AM VVS Donzetta Matters ?No c/o at this time ? ? ?Scheduled Meds: ? [MAR Hold] amLODipine  10 mg Oral QHS  ? [MAR Hold] carvedilol  3.125 mg Oral BID WC  ? [MAR Hold] Chlorhexidine Gluconate Cloth  6 each Topical Q0600  ? [MAR Hold] cholecalciferol  1,000 Units Oral Daily  ? [MAR Hold] heparin  5,000 Units Subcutaneous Q8H  ? hydrALAZINE  50 mg Oral Q8H  ? [MAR Hold] insulin aspart  0-6 Units Subcutaneous TID WC  ? [MAR Hold] insulin glargine-yfgn  10 Units Subcutaneous QHS  ? [MAR Hold] isosorbide mononitrate  30 mg Oral Daily  ? [MAR Hold] sevelamer carbonate  800 mg Oral TID WC  ? ?Continuous Infusions: ? [MAR Hold] sodium chloride    ? [MAR Hold] sodium chloride    ? sodium chloride    ? acetaminophen    ? [MAR Hold] cefUROXime (ZINACEF)  IV    ? ?PRN Meds:.[MAR Hold] sodium chloride, [MAR Hold] sodium chloride, acetaminophen, acetaminophen **OR** acetaminophen (TYLENOL) oral liquid 160 mg/5 mL, [MAR Hold] alteplase, [MAR Hold] diphenhydrAMINE, fentaNYL (SUBLIMAZE) injection, [MAR Hold] heparin, hydrALAZINE, insulin aspart, [MAR Hold] lidocaine (PF), [MAR Hold] lidocaine-prilocaine, [MAR Hold] pentafluoroprop-tetrafluoroeth ? ?Current Labs: reviewed  ? Latest Reference Range & Units 12/09/21 09:18  ?Saturation Ratios 17.9 - 39.5 % 50 (H)  ?Ferritin 24 - 336 ng/mL 253  ?(H): Data is abnormally high ? ?Physical Exam:  Blood pressure (!) 162/93, pulse 82, temperature 98.6 ?F (37 ?C), resp. rate 19, height 5\' 5"  (1.651 m), weight 86 kg, SpO2 98 %. ?NAD ?RRR no rub ?CTAB nl WOB ?S/nt/nd ?R IJ TDC bandaged/ c/d/I ?L RC AVF + bruit ?Nl mood affect ?2+ LEE ?Nonfocal ? ?A ?20M new ESRD, uremic ? ?New ESRD ?S/p [facility] 12/09/21 R IJ with IR ?L RC AVF VVS 12/10/21 ?HD#1 3/1 ?HD#2 3/2, then cont on THS schedule: 2K 300/500, 2.5h, 1L UF, no heparin, 3.5 Ca ?CLIP in process, declined on TCU ?HTN / Hypervolemia:  should improve with HD / UF ?ANemia of CKD,  TSAT 50%, not likley to respond to Fe. 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  PTH pending.  Ca low, not surprising.  High Ca bath, Start C3 ?DM2, cause of his ESRD ? ? ?P ?As above ?Medication Issues; ?Preferred narcotic agents for pain control are hydromorphone, fentanyl, and methadone. Morphine should not be used.  ?Baclofen should be avoided ?Avoid oral sodium phosphate and magnesium citrate based laxatives / bowel preps  ? ? ?Pearson Grippe MD ?12/10/2021, 11:37 AM ? ?Recent Labs  ?Lab 12/08/21 ?1444 12/08/21 ?1725 12/09/21 ?7510 12/09/21 ?2585 12/10/21 ?0431  ?NA 138 138 139  --  139  ?K 4.2 4.2 4.5  --  3.9  ?CL 108 107 108  --  108  ?CO2 18*  --  16*  --  22  ?GLUCOSE 109* 145* 87  --  109*  ?BUN 106* 113* 107*  --  68*  ?CREATININE 16.51* >18.00* 16.31*  --  12.02*  ?CALCIUM 6.3*  --  6.5*  --  6.6*  ?PHOS  --   --   --  9.4*  --   ? ? ?Recent Labs  ?Lab 12/08/21 ?1444 12/08/21 ?1725 12/09/21 ?2778 12/10/21 ?0431  ?WBC 12.4*  --  13.3* 12.3*  ?NEUTROABS 9.8*  --   --  9.9*  ?  HGB 7.1* 7.5* 7.1* 6.3*  ?HCT 21.1* 22.0* 21.2* 19.1*  ?MCV 61.5*  --  61.1* 61.2*  ?PLT 330  --  328 309  ? ? ? ? ? ? ? ? ? ? ? ? ?

## 2021-12-10 NOTE — Op Note (Signed)
? ? ?  Patient name: Randy Ross MRN: [id number] DOB: 02-May-1971 Sex: male ? ?12/10/2021 ?Pre-operative Diagnosis: End-stage renal disease ?Post-operative diagnosis:  Same ?Surgeon:  Eda Paschal. Donzetta Matters, MD ?Assistant: Paulo Fruit, PA ?Procedure Performed: Left radial artery to cephalic vein AV fistula creation ? ?Indications: 51 year old male now with end-stage renal disease dialyzing via catheter.  He is indicated for permanent access.  He is right-hand dominant never had any pacemakers or ports or other left upper extremity procedures we are planning for left arm access. ? ?An experienced assistant was necessary to facilitate exposure using suction and countertraction as well as follow sutures during anastomosis. ? ?Findings: Cephalic vein at the wrist measured approximately 3 mm this was dilated to 3-1/2 mm.  The radial artery was free of disease measured 3 mm.  At completion there was a very strong thrill in the vein that could be traced both by palpation and with Doppler throughout the forearm.  There was a strong ulnar artery signal at the wrist. ?  ?Procedure:  The patient was identified in the holding area and taken to the operating room where is placed supine operative table and MAC anesthesia was induced.  He was sterilely prepped and draped in the left upper extremity usual fashion, antibiotics were ministered a timeout was called.  A preoperative block of been placed.  Ultrasound was used to identify what appeared to be a suitable cephalic vein in radial artery at the wrist.  The block was tested was noted to be intact.  A transverse incision was made between the 2.  The vein was dissected out and marked for orientation.  We dissected sharply using scissors down to the artery to avoid nerve injury.  This was facilitated by the assistant using countertraction and suction.  The artery was identified and Vesseloops placed around this.  We then doubly clipped the cephalic vein distally and transected.  Was  serially dilated to 3 and half millimeters flushed with heparinized saline and clamped.  The arteries clamped distally proximally opened longitudinally.  The vein was then spatulated and sewn into side with 6-0 Prolene suture.  The assistant facilitated the anastomosis by following the suture.  After this we allowed flushing all directions.  Upon completion there was a very strong thrill palpated throughout the forearm.  We did free up some of the soft tissue and then traced the fistula with Doppler.  There was a good radial artery signal distal to the fistula very strong ulnar signal.  The wound was then irrigated and the assistant facilitated closure of the skin with 4-0 Monocryl.  Dermabond is placed at the skin site.  He was awakened from anesthesia having tolerated procedure without any complication.  All counts were correct at completion. ? ?EBL: 20 cc ? ? ? ?Nonnie Pickney C. Donzetta Matters, MD ?[facility] ?Office: [phone] ?Pager: [phone] ? ? ?

## 2021-12-10 NOTE — Progress Notes (Signed)
Requested to see pt for out-pt HD arrangements. Met with pt's sign other, Ratsamy, in pt's room. Pt was off the floor for a procedure. Pt's sign other states that she and pt have discussed out-pt HD at d/c and pt prefers to go to a clinic close to pt's mother's home/address. Pt will be staying there some at d/c and sign other will take pt to mother's home when she has to work and pt needs HD. Pt's mother resides in [place] therefore will proceed with referral to [facility]. Referral submitted this am. Discussed possible option of TCU and inquired if there is interest in home therapy in the future. At this time, sign other feels that pt will want to proceed with in-center treatment 3x's a week. Pt is currently uninsured therefore will need financial clearance to be accepted at an out-pt clinic. Advised pt's sign other that someone from [facility] will be contacting pt to ask some additional financial questions. Pt's family to provide transportation to/from HD at d/c. Will assist as needed.  ? ?[name] ?Renal Navigator ?[phone] ?

## 2021-12-10 NOTE — Progress Notes (Signed)
PT Cancellation Note ? ?Patient Details ?Name: Randy Ross ?MRN: [id number] ?DOB: 11-11-1970 ? ? ?Cancelled Treatment:    Reason Eval/Treat Not Completed: Patient at procedure or test/unavailable ? ?Patient now off the unit for dialysis. Will attempt PT eval 12/11/21. ? ? ?Arby Barrette, PT ?Acute Rehabilitation Services  ?Pager [phone] ?Office [phone] ? ? ?Jeanie Cooks Ardice Boyan ?12/10/2021, 3:29 PM ?

## 2021-12-10 NOTE — Progress Notes (Signed)
PT Cancellation Note ? ?Patient Details ?Name: Randy Ross ?MRN: [id number] ?DOB: Jul 03, 1971 ? ? ?Cancelled Treatment:    Reason Eval/Treat Not Completed: Patient at procedure or test/unavailable ? ?Patient off unit for surgery. Will reattempt as appropriate. ? ? ?Arby Barrette, PT ?Acute Rehabilitation Services  ?Pager [phone] ?Office [phone] ?  ? ? ? ?Randy Ross ?12/10/2021, 9:02 AM ?

## 2021-12-10 NOTE — Progress Notes (Signed)
?  Progress Note ? ? ? ?12/10/2021 ?8:44 AM ?Day of Surgery ? ?Subjective:  no acute issues ? ?Vitals:  ? 12/10/21 0835 12/10/21 0836  ?BP:  (!) 184/83  ?Pulse: 76   ?Resp: 16   ?Temp: 98.5 ?F (36.9 ?C)   ?SpO2: 100%   ? ? ?Physical Exam: ?Aaox3 ?Left arm palpable radial artery ?No visulized veins in left upper arm ? ?CBC ?   ?Component Value Date/Time  ? WBC 12.3 (H) 12/10/2021 0431  ? RBC 3.12 (L) 12/10/2021 0431  ? HGB 6.3 (LL) 12/10/2021 0431  ? HGB 10.5 (L) 08/09/2019 0827  ? HCT 19.1 (L) 12/10/2021 0431  ? PLT 309 12/10/2021 0431  ? PLT 396 08/09/2019 0827  ? MCV 61.2 (L) 12/10/2021 0431  ? MCH 20.2 (L) 12/10/2021 0431  ? MCHC 33.0 12/10/2021 0431  ? RDW 21.0 (H) 12/10/2021 0431  ? LYMPHSABS 1.1 12/10/2021 0431  ? MONOABS 1.0 12/10/2021 0431  ? EOSABS 0.3 12/10/2021 0431  ? BASOSABS 0.0 12/10/2021 0431  ? ? ?BMET ?   ?Component Value Date/Time  ? NA 139 12/10/2021 0431  ? K 3.9 12/10/2021 0431  ? CL 108 12/10/2021 0431  ? CO2 22 12/10/2021 0431  ? GLUCOSE 109 (H) 12/10/2021 0431  ? BUN 68 (H) 12/10/2021 0431  ? CREATININE 12.02 (H) 12/10/2021 0431  ? CREATININE 2.54 (H) 08/09/2019 0827  ? CALCIUM 6.6 (L) 12/10/2021 0431  ? GFRNONAA 5 (L) 12/10/2021 0431  ? GFRNONAA NOT CALCULATED 08/09/2019 0827  ? GFRAA NOT CALCULATED 08/09/2019 0827  ? ? ?INR ?No results found for: INR ? ? ?Intake/Output Summary (Last 24 hours) at 12/10/2021 0844 ?Last data filed at 12/09/2021 2354 ?Gross per 24 hour  ?Intake --  ?Output 1 ml  ?Net -1 ml  ? ? ? ?Assessment:  51 y.o. male is here with esrd on hd via tdc ? ?Plan: ?OR today for left arm avf vs avg ? ? ?Kazzandra Desaulniers C. Donzetta Matters, MD ?[facility] ?Office: [phone] ?Pager: [phone] ? ?12/10/2021 ?8:44 AM ? ?

## 2021-12-10 NOTE — TOC Initial Note (Signed)
Transition of Care (TOC) - Initial/Assessment Note  ? ? ?Patient Details  ?Name: Randy Ross ?MRN: [id number] ?Date of Birth: 04-02-1971 ? ?Transition of Care (TOC) CM/SW Contact:    ?Verdell Carmine, RN ?Phone Number: ?12/10/2021, 3:09 PM ? ?Clinical Narrative:                 ?Patient presented with AKI, new dialysis patient. Renal CSW saw and spoke to patient. Patient currently uninsured,  May need assistance with medications, however is currently listed as employed, having Medicaid potential, so screened and awaiting confirmation.  ?Will follow for needs. If DC the weekend, send DC meds to [facility] pharmacy tomorrow.( Friday). ? ? ?Expected Discharge Plan: Home/Self Care ?Barriers to Discharge: Continued Medical Work up ? ? ?Patient Goals and CMS Choice ?  ?  ?  ? ?Expected Discharge Plan and Services ?Expected Discharge Plan: Home/Self Care ?  ?  ?  ?Living arrangements for the past 2 months: [place] ?                ?  ?  ?  ?  ?  ?  ?  ?  ?  ?  ? ?Prior Living Arrangements/Services ?Living arrangements for the past 2 months: [place] ?Lives with:: Significant Other ?Patient language and need for interpreter reviewed:: Yes ?       ?Need for Family Participation in Patient Care: Yes (Comment) ?Care giver support system in place?: Yes (comment) ?  ?Criminal Activity/Legal Involvement Pertinent to Current Situation/Hospitalization: No - Comment as needed ? ?Activities of Daily Living ?Home Assistive Devices/Equipment: Grab bars in shower ?ADL Screening (condition at time of admission) ?Patient's cognitive ability adequate to safely complete daily activities?: No ?Is the patient deaf or have difficulty hearing?: No ?Does the patient have difficulty seeing, even when wearing glasses/contacts?: No ?Does the patient have difficulty concentrating, remembering, or making decisions?: No ?Patient able to express need for assistance with ADLs?: Yes ?Does the patient have difficulty dressing or  bathing?: No ?Independently performs ADLs?: Yes (appropriate for developmental age) ?Does the patient have difficulty walking or climbing stairs?: Yes ?Weakness of Legs: Both (weakness and SOB) ?Weakness of Arms/Hands: Right (elbow) ? ?Permission Sought/Granted ?  ?  ?   ?   ?   ?   ? ?Emotional Assessment ?  ?  ?  ?Orientation: : Oriented to Self, Oriented to Place, Oriented to  Time, Oriented to Situation ?Alcohol / Substance Use: Not Applicable ?Psych Involvement: No (comment) ? ?Admission diagnosis:  SOB (shortness of breath) [R06.02] ?AKI (acute kidney injury) ([place]) [N17.9] ?Acute renal failure, unspecified acute renal failure type ([place]) [N17.9] ?Patient Active Problem List  ? Diagnosis Date Noted  ? CKD (chronic kidney disease), stage IV ([place]) 12/08/2021  ? AKI (acute kidney injury) ([place]) 12/08/2021  ? MGUS (monoclonal gammopathy of unknown significance) 08/06/2019  ? Microcytic anemia 08/06/2019  ? ?PCP:  Sueanne Margarita, DO ?Pharmacy:   ?[place], [address] ?[place] [address] ?Phone: [phone] Fax: [phone] ? ? ? ? ?Social Determinants of Health (SDOH) Interventions ?  ? ?Readmission Risk Interventions ?No flowsheet data found. ? ? ?

## 2021-12-10 NOTE — Progress Notes (Addendum)
Lance Coon, CRNA gave me versed and fent to be used for block in SS. 1 V and 1 F given for block, remaining meds of Versed and Fent given back to Lance Coon, CRNA in [place]. ?

## 2021-12-10 NOTE — Anesthesia Preprocedure Evaluation (Addendum)
Anesthesia Evaluation  ?Patient identified by MRN, date of birth, ID band ?Patient awake ? ? ? ?Reviewed: ?Allergy & Precautions, NPO status , Patient's Chart, lab work & pertinent test results ? ?History of Anesthesia Complications ?Negative for: history of anesthetic complications ? ?Airway ?Mallampati: II ? ?TM Distance: >3 FB ?Neck ROM: Full ? ? ? Dental ? ?(+) Teeth Intact, Dental Advisory Given,  ?  ?Pulmonary ?neg shortness of breath, neg COPD, neg recent URI, Current Smoker and Patient abstained from smoking.,  ?  ?breath sounds clear to auscultation ? ? ? ? ? ? Cardiovascular ?hypertension, Pt. on medications and Pt. on home beta blockers ? ?Rhythm:Regular  ? ?  ?Neuro/Psych ?negative neurological ROS ? negative psych ROS  ? GI/Hepatic ?negative GI ROS, Neg liver ROS,   ?Endo/Other  ?diabetes ? Renal/GU ?ESRF and DialysisRenal diseaseLab Results ?     Component                Value               Date                 ?     CREATININE               12.02 (H)           12/10/2021           ?Lab Results ?     Component                Value               Date                 ?     K                        3.9                 12/10/2021           ?  ? ?  ?Musculoskeletal ?negative musculoskeletal ROS ?(+)  ? Abdominal ?  ?Peds ? Hematology ? ?(+) Blood dyscrasia, anemia , Lab Results ?     Component                Value               Date                 ?     WBC                      12.3 (H)            12/10/2021           ?     HGB                      6.3 (LL)            12/10/2021           ?     HCT                      19.1 (L)            12/10/2021           ?     MCV  61.2 (L)            12/10/2021           ?     PLT                      309                 12/10/2021           ?   ?Anesthesia Other Findings ? ? Reproductive/Obstetrics ? ?  ? ? ? ? ? ? ? ? ? ? ? ? ? ?  ?  ? ? ? ? ? ? ? ?Anesthesia Physical ?Anesthesia Plan ? ?ASA: 4 ? ?Anesthesia  Plan: MAC and Regional  ? ?Post-op Pain Management: Regional block*  ? ?Induction:  ? ?PONV Risk Score and Plan: 0 and Propofol infusion and Treatment may vary due to age or medical condition ? ?Airway Management Planned: Nasal Cannula ? ?Additional Equipment: None ? ?Intra-op Plan:  ? ?Post-operative Plan:  ? ?Informed Consent:  ? ?Plan Discussed with:  ? ?Anesthesia Plan Comments:   ? ? ? ? ? ? ?Anesthesia Quick Evaluation ? ?

## 2021-12-11 ENCOUNTER — Encounter (HOSPITAL_COMMUNITY): Payer: Self-pay | Admitting: Vascular Surgery

## 2021-12-11 LAB — COMPREHENSIVE METABOLIC PANEL
ALT: 11 U/L (ref 0–44)
AST: 15 U/L (ref 15–41)
Albumin: 2 g/dL — ABNORMAL LOW (ref 3.5–5.0)
Alkaline Phosphatase: 57 U/L (ref 38–126)
Anion gap: 9 (ref 5–15)
BUN: 40 mg/dL — ABNORMAL HIGH (ref 6–20)
CO2: 27 mmol/L (ref 22–32)
Calcium: 7.7 mg/dL — ABNORMAL LOW (ref 8.9–10.3)
Chloride: 102 mmol/L (ref 98–111)
Creatinine, Ser: 8.21 mg/dL — ABNORMAL HIGH (ref 0.61–1.24)
GFR, Estimated: 7 mL/min — ABNORMAL LOW (ref >60)
Glucose, Bld: 113 mg/dL — ABNORMAL HIGH (ref 70–99)
Potassium: 3.9 mmol/L (ref 3.5–5.1)
Sodium: 138 mmol/L (ref 135–145)
Total Bilirubin: 0.3 mg/dL (ref 0.3–1.2)
Total Protein: 5.1 g/dL — ABNORMAL LOW (ref 6.5–8.1)

## 2021-12-11 LAB — CBC WITH DIFFERENTIAL/PLATELET
Abs Immature Granulocytes: 0.05 10*3/uL (ref 0.00–0.07)
Basophils Absolute: 0 10*3/uL (ref 0.0–0.1)
Basophils Relative: 0 %
Eosinophils Absolute: 0.3 10*3/uL (ref 0.0–0.5)
Eosinophils Relative: 3 %
HCT: 21.1 % — ABNORMAL LOW (ref 39.0–52.0)
Hemoglobin: 7 g/dL — ABNORMAL LOW (ref 13.0–17.0)
Immature Granulocytes: 1 %
Lymphocytes Relative: 13 %
Lymphs Abs: 1.4 10*3/uL (ref 0.7–4.0)
MCH: 21.1 pg — ABNORMAL LOW (ref 26.0–34.0)
MCHC: 33.2 g/dL (ref 30.0–36.0)
MCV: 63.6 fL — ABNORMAL LOW (ref 80.0–100.0)
Monocytes Absolute: 0.9 10*3/uL (ref 0.1–1.0)
Monocytes Relative: 8 %
Neutro Abs: 8.3 10*3/uL — ABNORMAL HIGH (ref 1.7–7.7)
Neutrophils Relative %: 75 %
Platelets: 272 10*3/uL (ref 150–400)
RBC: 3.32 MIL/uL — ABNORMAL LOW (ref 4.22–5.81)
RDW: 22.1 % — ABNORMAL HIGH (ref 11.5–15.5)
WBC: 11 10*3/uL — ABNORMAL HIGH (ref 4.0–10.5)
nRBC: 0 % (ref 0.0–0.2)

## 2021-12-11 LAB — GLUCOSE, CAPILLARY
Glucose-Capillary: 138 mg/dL — ABNORMAL HIGH (ref 70–99)
Glucose-Capillary: 167 mg/dL — ABNORMAL HIGH (ref 70–99)
Glucose-Capillary: 112 mg/dL — ABNORMAL HIGH (ref 70–99)

## 2021-12-11 LAB — MAGNESIUM: Magnesium: 1.6 mg/dL — ABNORMAL LOW (ref 1.7–2.4)

## 2021-12-11 LAB — HEPATITIS B SURFACE ANTIBODY, QUANTITATIVE: Hep B S AB Quant (Post): 57.5 m[IU]/mL (ref >9.9)

## 2021-12-11 MED ORDER — CARVEDILOL 6.25 MG PO TABS
6.2500 mg | ORAL_TABLET | Freq: Two times a day (BID) | ORAL | Status: DC
  Administered 2021-12-11 – 2021-12-15 (×9): 6.25 mg via ORAL
  Filled 2021-12-11 (×9): qty 1

## 2021-12-11 MED ORDER — MAGNESIUM SULFATE 2 GM/50ML IV SOLN
2.0000 g | Freq: Once | INTRAVENOUS | Status: AC
  Administered 2021-12-11: 2 g via INTRAVENOUS
  Filled 2021-12-11: qty 50

## 2021-12-11 MED ORDER — RENA-VITE PO TABS
1.0000 | ORAL_TABLET | Freq: Every day | ORAL | Status: DC
  Administered 2021-12-11 – 2021-12-14 (×4): 1 via ORAL
  Filled 2021-12-11 (×4): qty 1

## 2021-12-11 MED ORDER — ONDANSETRON HCL 4 MG/2ML IJ SOLN
4.0000 mg | Freq: Three times a day (TID) | INTRAMUSCULAR | Status: DC | PRN
  Administered 2021-12-11: 4 mg via INTRAVENOUS
  Filled 2021-12-11: qty 2

## 2021-12-11 MED ORDER — BISACODYL 10 MG RE SUPP
10.0000 mg | Freq: Once | RECTAL | Status: AC
  Administered 2021-12-11: 10 mg via RECTAL
  Filled 2021-12-11: qty 1

## 2021-12-11 MED ORDER — DIPHENHYDRAMINE-ZINC ACETATE 2-0.1 % EX CREA
TOPICAL_CREAM | Freq: Two times a day (BID) | CUTANEOUS | Status: DC | PRN
  Filled 2021-12-11: qty 28

## 2021-12-11 NOTE — Evaluation (Signed)
Physical Therapy Evaluation ?Patient Details ?Name: Randy Ross ?MRN: [id number] ?DOB: 12-01-1970 ?Today's Date: 12/11/2021 ? ?History of Present Illness ? Pt is a 51 y.o. M who was sent from nephrology office for worsening of kidney function starting inpatient dialysis 12/08/2021. S/p [facility] 12/09/21 R IJ. S/p R AC AVF VVS 12/10/21. HD #1 3/1. Significant PMH: DM2, HTN, CKD stage IV, IgA MGUS, chronic microcytic anemia, diabetic neuropathy.  ?Clinical Impression ? PTA, pt lives with his spouse and is independent. Pt presents with decreased cardiopulmonary endurance, mild balance deficits, and functional weakness in setting of recently initiating HD. Pt ambulating 250 feet with single UE support on IV pole; utilizing step to vs step through pattern. Displays decreased gait speed for age. Encouraged continued mobilization while inpatient with spouse supervision. Don't anticipate need for PT follow up. Will continue to follow acutely. ?   ? ?Recommendations for follow up therapy are one component of a multi-disciplinary discharge planning process, led by the attending physician.  Recommendations may be updated based on patient status, additional functional criteria and insurance authorization. ? ?Follow Up Recommendations No PT follow up ? ?  ?Assistance Recommended at Discharge PRN  ?Patient can return home with the following ? Assist for transportation;Assistance with cooking/housework ? ?  ?Equipment Recommendations Kasandra Knudsen  ?Recommendations for Other Services ?    ?  ?Functional Status Assessment Patient has had a recent decline in their functional status and demonstrates the ability to make significant improvements in function in a reasonable and predictable amount of time.  ? ?  ?Precautions / Restrictions Precautions ?Precautions: Fall ?Restrictions ?Weight Bearing Restrictions: No  ? ?  ? ?Mobility ? Bed Mobility ?Overal bed mobility: Modified Independent ?  ?  ?  ?  ?  ?  ?  ?  ? ?Transfers ?Overall transfer level:  Independent ?Equipment used: None ?  ?  ?  ?  ?  ?  ?  ?  ?  ? ?Ambulation/Gait ?Ambulation/Gait assistance: Supervision ?Gait Distance (Feet): 250 Feet ?Assistive device: None ?Gait Pattern/deviations: Step-through pattern, Decreased stride length, Trunk flexed, Step-to pattern ?Gait velocity: decreased ?  ?  ?General Gait Details: Single UE support of IV, step to vs step through pattern, slow speed for age, slight trunk flexion ? ?Stairs ?  ?  ?  ?  ?  ? ?Wheelchair Mobility ?  ? ?Modified Rankin (Stroke Patients Only) ?  ? ?  ? ?Balance Overall balance assessment: Needs assistance ?Sitting-balance support: Feet supported ?Sitting balance-Leahy Scale: Normal ?Sitting balance - Comments: able to don socks edge of bed ?  ?Standing balance support: No upper extremity supported, During functional activity ?Standing balance-Leahy Scale: Fair ?  ?  ?  ?  ?  ?  ?  ?  ?  ?  ?  ?  ?   ? ? ? ?Pertinent Vitals/Pain Pain Assessment ?Pain Assessment: No/denies pain  ? ? ?Home Living Family/patient expects to be discharged to:: Private residence ?Living Arrangements: Spouse/significant other ?Available Help at Discharge: Family ?Type of Home: Apartment ?Home Access: Level entry ?  ?  ?  ?Home Layout: One level ?  ?   ?  ?Prior Function Prior Level of Function : Independent/Modified Independent ?  ?  ?  ?  ?  ?  ?Mobility Comments: has not worked in a month, typically independent ?  ?  ? ? ?Hand Dominance  ?   ? ?  ?Extremity/Trunk Assessment  ? Upper Extremity Assessment ?Upper Extremity Assessment: RUE deficits/detail;LUE deficits/detail ?  RUE Deficits / Details: Grossly 4/5 ?LUE Deficits / Details: Grossly 4/5 ?  ? ?Lower Extremity Assessment ?Lower Extremity Assessment: RLE deficits/detail;LLE deficits/detail ?RLE Deficits / Details: Grossly 4/5 ?LLE Deficits / Details: Grossly 4/5 ?  ? ?Cervical / Trunk Assessment ?Cervical / Trunk Assessment: Normal  ?Communication  ? Communication: No difficulties  ?Cognition  Arousal/Alertness: Awake/alert ?Behavior During Therapy: [facility] for tasks assessed/performed ?Overall Cognitive Status: Within Functional Limits for tasks assessed ?  ?  ?  ?  ?  ?  ?  ?  ?  ?  ?  ?  ?  ?  ?  ?  ?  ?  ?  ? ?  ?General Comments   ? ?  ?Exercises    ? ?Assessment/Plan  ?  ?PT Assessment Patient needs continued PT services  ?PT Problem List Decreased strength;Decreased activity tolerance;Decreased balance;Decreased mobility ? ?   ?  ?PT Treatment Interventions DME instruction;Gait training;Functional mobility training;Therapeutic activities;Therapeutic exercise;Balance training;Patient/family education   ? ?PT Goals (Current goals can be found in the Care Plan section)  ?Acute Rehab PT Goals ?Patient Stated Goal: go home Tuesday ?PT Goal Formulation: With patient ?Time For Goal Achievement: 12/25/21 ?Potential to Achieve Goals: Good ? ?  ?Frequency Min 2X/week ?  ? ? ?Co-evaluation   ?  ?  ?  ?  ? ? ?  ?AM-PAC PT "6 Clicks" Mobility  ?Outcome Measure Help needed turning from your back to your side while in a flat bed without using bedrails?: None ?Help needed moving from lying on your back to sitting on the side of a flat bed without using bedrails?: None ?Help needed moving to and from a bed to a chair (including a wheelchair)?: None ?Help needed standing up from a chair using your arms (e.g., wheelchair or bedside chair)?: None ?Help needed to walk in hospital room?: A Little ?Help needed climbing 3-5 steps with a railing? : A Little ?6 Click Score: 22 ? ?  ?End of Session   ?Activity Tolerance: Patient tolerated treatment well ?Patient left: in bed;with call bell/phone within reach;with family/visitor present ?Nurse Communication: Mobility status ?PT Visit Diagnosis: Unsteadiness on feet (R26.81);Difficulty in walking, not elsewhere classified (R26.2) ?  ? ?Time: 3532-9924 ?PT Time Calculation (min) (ACUTE ONLY): 22 min ? ? ?Charges:   PT Evaluation ?$PT Eval Moderate Complexity: 1 Mod ?  ?  ?    ? ? ?Wyona Almas, PT, DPT ?Acute Rehabilitation Services ?Pager [phone] ?Office [phone] ? ? ?Deno Etienne ?12/11/2021, 9:54 AM ? ?

## 2021-12-11 NOTE — Progress Notes (Signed)
Admit: 12/08/2021 ?LOS: 3 ? ?34M new ESRD, uremic, starting iHD ? ?Subjective:  ? ?NO interval issues, family in room ?HD#2 yesterday: 1L UF, did well ? ?Scheduled Meds: ? amLODipine  10 mg Oral QHS  ? calcitRIOL  0.25 mcg Oral Daily  ? calcium acetate  1,334 mg Oral TID WC  ? carvedilol  6.25 mg Oral BID WC  ? Chlorhexidine Gluconate Cloth  6 each Topical Q0600  ? cholecalciferol  1,000 Units Oral Daily  ? heparin  5,000 Units Subcutaneous Q8H  ? hydrALAZINE  50 mg Oral Q8H  ? insulin aspart  0-6 Units Subcutaneous TID WC  ? insulin glargine-yfgn  10 Units Subcutaneous QHS  ? isosorbide mononitrate  30 mg Oral Daily  ? ?Continuous Infusions: ? sodium chloride    ? sodium chloride    ? ?PRN Meds:.sodium chloride, sodium chloride, acetaminophen, alteplase, diphenhydrAMINE, heparin, hydrALAZINE, lidocaine (PF), lidocaine-prilocaine, oxyCODONE-acetaminophen, pentafluoroprop-tetrafluoroeth ? ?Current Labs: reviewed  ? Latest Reference Range & Units 12/09/21 09:18  ?Saturation Ratios 17.9 - 39.5 % 50 (H)  ?Ferritin 24 - 336 ng/mL 253  ?(H): Data is abnormally high ? ?Physical Exam:  Blood pressure (!) 150/66, pulse 92, temperature 99.6 ?F (37.6 ?C), temperature source Oral, resp. rate 13, height 5\' 5"  (1.651 m), weight 85.6 kg, SpO2 96 %. ?NAD ?RRR no rub ?CTAB nl WOB ?S/nt/nd ?R IJ TDC bandaged/ c/d/I ?L RC AVF + bruit and weak thrill ?Nl mood affect ?1-2+ LEE ?Nonfocal ? ?A ?34M new ESRD, uremic ? ?New ESRD ?S/p [facility] 12/09/21 R IJ with IR ?L RC AVF VVS 12/10/21 ?HD#1 3/1; #2 3/2 ?Cont on THS schedule: 2K 350/500, 3h, 2L UF, no heparin, 3.5 Ca ?CLIP in process, declined on TCU ?HTN / Hypervolemia: should improve with HD / UF ?ANemia of CKD,  TSAT 50%, not likley to respond to Fe. 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  PTH pending.  Ca low, not surprising.  High Ca bath, Start C3 ?DM2, cause of his ESRD ? ? ?P ?As above ?Medication Issues; ?Preferred narcotic agents for pain control are hydromorphone, fentanyl, and  methadone. Morphine should not be used.  ?Baclofen should be avoided ?Avoid oral sodium phosphate and magnesium citrate based laxatives / bowel preps  ? ? ?Pearson Grippe MD ?12/11/2021, 11:01 AM ? ?Recent Labs  ?Lab 12/09/21 ?1610 12/09/21 ?9604 12/10/21 ?0431 12/11/21 ?0149  ?NA 139  --  139 138  ?K 4.5  --  3.9 3.9  ?CL 108  --  108 102  ?CO2 16*  --  22 27  ?GLUCOSE 87  --  109* 113*  ?BUN 107*  --  68* 40*  ?CREATININE 16.31*  --  12.02* 8.21*  ?CALCIUM 6.5*  --  6.6* 7.7*  ?PHOS  --  9.4*  --   --   ? ? ?Recent Labs  ?Lab 12/08/21 ?1444 12/08/21 ?1725 12/09/21 ?5409 12/10/21 ?0431 12/10/21 ?1444 12/11/21 ?0149  ?WBC 12.4*  --  13.3* 12.3*  --  11.0*  ?NEUTROABS 9.8*  --   --  9.9*  --  8.3*  ?HGB 7.1*   < > 7.1* 6.3* 7.1* 7.0*  ?HCT 21.1*   < > 21.2* 19.1* 21.2* 21.1*  ?MCV 61.5*  --  61.1* 61.2*  --  63.6*  ?PLT 330  --  328 309  --  272  ? < > = values in this interval not displayed.  ? ? ? ? ? ? ? ? ? ? ? ? ?

## 2021-12-11 NOTE — Progress Notes (Addendum)
?  Progress Note ? ? ? ?12/11/2021 ?9:02 AM ?1 Day Post-Op ? ?Subjective:  mild incisional soreness ? ? ?Vitals:  ? 12/11/21 0314 12/11/21 0740  ?BP: (!) 156/92 (!) 150/66  ?Pulse: 87 92  ?Resp: 13 13  ?Temp: 99 ?F (37.2 ?C) 99.6 ?F (37.6 ?C)  ?SpO2: 96% 96%  ? ?Physical Exam: ?Cardiac:  regular ?Lungs:  non labored ?Incisions:  left radial incision is intact and well appearing, no swelling or hematoma ?Extremities:  2+ left radial pulse. Fistula with excellent thrill ?Neurologic: alert and oriented ? ?CBC ?   ?Component Value Date/Time  ? WBC 11.0 (H) 12/11/2021 0149  ? RBC 3.32 (L) 12/11/2021 0149  ? HGB 7.0 (L) 12/11/2021 0149  ? HGB 10.5 (L) 08/09/2019 0827  ? HCT 21.1 (L) 12/11/2021 0149  ? PLT 272 12/11/2021 0149  ? PLT 396 08/09/2019 0827  ? MCV 63.6 (L) 12/11/2021 0149  ? MCH 21.1 (L) 12/11/2021 0149  ? MCHC 33.2 12/11/2021 0149  ? RDW 22.1 (H) 12/11/2021 0149  ? LYMPHSABS 1.4 12/11/2021 0149  ? MONOABS 0.9 12/11/2021 0149  ? EOSABS 0.3 12/11/2021 0149  ? BASOSABS 0.0 12/11/2021 0149  ? ? ?BMET ?   ?Component Value Date/Time  ? NA 138 12/11/2021 0149  ? K 3.9 12/11/2021 0149  ? CL 102 12/11/2021 0149  ? CO2 27 12/11/2021 0149  ? GLUCOSE 113 (H) 12/11/2021 0149  ? BUN 40 (H) 12/11/2021 0149  ? CREATININE 8.21 (H) 12/11/2021 0149  ? CREATININE 2.54 (H) 08/09/2019 0827  ? CALCIUM 7.7 (L) 12/11/2021 0149  ? GFRNONAA 7 (L) 12/11/2021 0149  ? GFRNONAA NOT CALCULATED 08/09/2019 0827  ? GFRAA NOT CALCULATED 08/09/2019 0827  ? ? ?INR ?No results found for: INR ? ? ?Intake/Output Summary (Last 24 hours) at 12/11/2021 0902 ?Last data filed at 12/10/2021 1750 ?Gross per 24 hour  ?Intake 1350.5 ml  ?Output 1017 ml  ?Net 333.5 ml  ? ? ? ?Assessment/Plan:  51 y.o. male is s/p left radiocephalic AV fistula creation 1 Day Post-Op  ? ?Left wrist incision is c/d/I without swelling or hematoma ?No signs or symptoms of steal ?Fistula has great thrill ?He will have follow up duplex in our office in 6 weeks ? ? ?Karoline Caldwell,  PA-C ?[facility] ?[phone] ?12/11/2021 ?9:02 AM ? ?I have independently interviewed and examined patient and agree with PA assessment and plan above.  ? ?Stone Spirito C. Donzetta Matters, MD ?[facility] ?Office: [phone] ?Pager: [phone] ? ?

## 2021-12-11 NOTE — Progress Notes (Signed)
Met with pt and pt's significant other at bedside. Both advised that pt will likely be accepted at [facility] once financial clearance is obtained. Pt states he has not received any calls from [employer]. Contacted [facility] admissions dept to request an update. Will assist as needed.  ? ?[name] ?Renal Navigator ?[phone] ?

## 2021-12-11 NOTE — Progress Notes (Signed)
Initial Nutrition Assessment ? ?DOCUMENTATION CODES:  ? ?Not applicable ? ?INTERVENTION:  ? ?Diet education  ?Renal Multivitamin w/ minerals daily ?Encourage good PO intake ? ?NUTRITION DIAGNOSIS:  ? ?Increased nutrient needs related to acute illness (new ESRD on HD) as evidenced by estimated needs. ? ?GOAL:  ? ?Patient will meet greater than or equal to 90% of their needs ? ?MONITOR:  ? ?PO intake, Weight trends, I & O's, Labs ? ?REASON FOR ASSESSMENT:  ? ?Malnutrition Screening Tool ?  ? ?ASSESSMENT:  ? ?51 y.o. male presented to the ED from nephrology's office due to worsening kidney function with plan to start iHD. PMH includes CKD IV, HTN, T2DM, IgA MGUS. Pt admitted with AKI on CKD, anemia, and proteinuria.  ? ?3/01 - Tunneled HD catheter placed  ?3/02 - AV fistula placed  ?  ?Pt sleeping at time of visit. Pt woke periodically during RD visit. Some information obtained from pt fiances at bedside.  ? ?Pt reports that he was eating ok at home, but was having a lot of vomiting for a few days before admission. Pt reports that he is still feeling nauseas but has not had any vomiting. Pt fiance reports that pt is drinking water and most fluids sent up on the tray.  ? ?No meal completions have been recorded within EMR. ? ?Pt reports that he had gained weight prior to admission. No weights recorded in EMR to assess pt weight loss within the past year. ? ?Provided pt's fiance with "Nutrition for People on Dialysis" and "All About Protein for People on HD" from the Academy of [employer] Dietetics. Reviewed the handouts with pt and fiance. Discussed that pt needs to have adequate protein intake while on dialysis. Monitor and limit sodium, potassium, and phosphorus intake. Pt fiance confirmed that pt is still having urine output; discussed that fluid intake should be monitored more closely if pt begins to produce less urine. Expressed that pt will be followed by a RD at dialysis that will provide additional education  and answer more questions. ? ?Pt and fiance with no other questions or concerns at this time.  ? ? ?Medications reviewed and include: Calcitriol, Phoslo, Vitamin D3, SSI 0-6 units TID, Semglee, Magnesium Sulfate ?Labs reviewed: Phosphorus 9.4 (H), Magnesium 1.6 (L), Hgb A1c 5.9%, 24 hr CBG 80-167 ? ?HD on 3/2 ?Net UF: 1007 mL ? ?NUTRITION - FOCUSED PHYSICAL EXAM: ? ?Deferred to follow-up due to pt sleeping. ? ?Diet Order:   ?Diet Order   ? ?       ?  Diet renal with fluid restriction Fluid restriction: 1500 mL Fluid; Room service appropriate? Yes; Fluid consistency: Thin  Diet effective now       ?  ? ?  ?  ? ?  ? ? ?EDUCATION NEEDS:  ? ?Education needs have been addressed ? ?Skin:  Skin Assessment: Reviewed RN Assessment ? ?Last BM:  No Documentation ? ?Height:  ? ?Ht Readings from Last 1 Encounters:  ?12/10/21 5\' 5"  (1.651 m)  ? ? ?Weight:  ? ?Wt Readings from Last 1 Encounters:  ?12/10/21 85.6 kg  ? ? ?Ideal Body Weight:  61.8 kg ? ?BMI:  Body mass index is 31.4 kg/m?. ? ?Estimated Nutritional Needs:  ? ?Kcal:  2200-2400 ? ?Protein:  110-125 grams ? ?Fluid:  UOP + 1L ? ? ? ?Hermina Barters RD, LDN ?Clinical Dietitian ?See AMiON for contact information.  ? ?

## 2021-12-12 ENCOUNTER — Encounter (HOSPITAL_COMMUNITY): Payer: Self-pay | Admitting: Vascular Surgery

## 2021-12-12 LAB — COMPREHENSIVE METABOLIC PANEL
ALT: 8 U/L (ref 0–44)
AST: 13 U/L — ABNORMAL LOW (ref 15–41)
Albumin: 2.1 g/dL — ABNORMAL LOW (ref 3.5–5.0)
Alkaline Phosphatase: 57 U/L (ref 38–126)
Anion gap: 9 (ref 5–15)
BUN: 53 mg/dL — ABNORMAL HIGH (ref 6–20)
CO2: 24 mmol/L (ref 22–32)
Calcium: 7.8 mg/dL — ABNORMAL LOW (ref 8.9–10.3)
Chloride: 105 mmol/L (ref 98–111)
Creatinine, Ser: 10.37 mg/dL — ABNORMAL HIGH (ref 0.61–1.24)
GFR, Estimated: 6 mL/min — ABNORMAL LOW (ref >60)
Glucose, Bld: 76 mg/dL (ref 70–99)
Potassium: 4.2 mmol/L (ref 3.5–5.1)
Sodium: 138 mmol/L (ref 135–145)
Total Bilirubin: 0.4 mg/dL (ref 0.3–1.2)
Total Protein: 5.4 g/dL — ABNORMAL LOW (ref 6.5–8.1)

## 2021-12-12 LAB — GLUCOSE, CAPILLARY
Glucose-Capillary: 175 mg/dL — ABNORMAL HIGH (ref 70–99)
Glucose-Capillary: 78 mg/dL (ref 70–99)
Glucose-Capillary: 175 mg/dL — ABNORMAL HIGH (ref 70–99)
Glucose-Capillary: 210 mg/dL — ABNORMAL HIGH (ref 70–99)

## 2021-12-12 LAB — CBC WITH DIFFERENTIAL/PLATELET
Abs Immature Granulocytes: 0.05 10*3/uL (ref 0.00–0.07)
Basophils Absolute: 0 10*3/uL (ref 0.0–0.1)
Basophils Relative: 0 %
Eosinophils Absolute: 0.4 10*3/uL (ref 0.0–0.5)
Eosinophils Relative: 3 %
HCT: 20.8 % — ABNORMAL LOW (ref 39.0–52.0)
Hemoglobin: 6.8 g/dL — CL (ref 13.0–17.0)
Immature Granulocytes: 0 %
Lymphocytes Relative: 13 %
Lymphs Abs: 1.6 10*3/uL (ref 0.7–4.0)
MCH: 21.3 pg — ABNORMAL LOW (ref 26.0–34.0)
MCHC: 32.7 g/dL (ref 30.0–36.0)
MCV: 65 fL — ABNORMAL LOW (ref 80.0–100.0)
Monocytes Absolute: 1.1 10*3/uL — ABNORMAL HIGH (ref 0.1–1.0)
Monocytes Relative: 9 %
Neutro Abs: 9.3 10*3/uL — ABNORMAL HIGH (ref 1.7–7.7)
Neutrophils Relative %: 75 %
Platelets: 292 10*3/uL (ref 150–400)
RBC: 3.2 MIL/uL — ABNORMAL LOW (ref 4.22–5.81)
RDW: 22 % — ABNORMAL HIGH (ref 11.5–15.5)
WBC: 12.4 10*3/uL — ABNORMAL HIGH (ref 4.0–10.5)
nRBC: 0 % (ref 0.0–0.2)

## 2021-12-12 LAB — MAGNESIUM: Magnesium: 2.2 mg/dL (ref 1.7–2.4)

## 2021-12-12 LAB — PREPARE RBC (CROSSMATCH)

## 2021-12-12 MED ORDER — SODIUM CHLORIDE 0.9% IV SOLUTION: Freq: Once | INTRAVENOUS | Status: DC

## 2021-12-12 MED ORDER — DARBEPOETIN ALFA 100 MCG/0.5ML IJ SOSY
100.0000 ug | PREFILLED_SYRINGE | INTRAMUSCULAR | Status: DC
  Administered 2021-12-12: 100 ug via INTRAVENOUS
  Filled 2021-12-12: qty 0.5

## 2021-12-12 NOTE — Procedures (Signed)
I was present at this dialysis session. I have reviewed the session itself and made appropriate changes.  ? ?Hb 6/8 transfused already today.  Give aranesp today. ? ?AVF +B/T. ? ?Goal UF 2L.  BPs stable.  [facility] working well.   ? ?Filed Weights  ? 12/10/21 1510 12/10/21 1800 12/12/21 0741  ?Weight: 86.6 kg 85.6 kg 86 kg  ? ? ?Recent Labs  ?Lab 12/09/21 ?9417 12/10/21 ?0431 12/12/21 ?0107  ?NA  --    < > 138  ?K  --    < > 4.2  ?CL  --    < > 105  ?CO2  --    < > 24  ?GLUCOSE  --    < > 76  ?BUN  --    < > 53*  ?CREATININE  --    < > 10.37*  ?CALCIUM  --    < > 7.8*  ?PHOS 9.4*  --   --   ? < > = values in this interval not displayed.  ? ? ?Recent Labs  ?Lab 12/10/21 ?0431 12/10/21 ?1444 12/11/21 ?0149 12/12/21 ?0107  ?WBC 12.3*  --  11.0* 12.4*  ?NEUTROABS 9.9*  --  8.3* 9.3*  ?HGB 6.3* 7.1* 7.0* 6.8*  ?HCT 19.1* 21.2* 21.1* 20.8*  ?MCV 61.2*  --  63.6* 65.0*  ?PLT 309  --  272 292  ? ? ?Scheduled Meds: ? sodium chloride   Intravenous Once  ? amLODipine  10 mg Oral QHS  ? calcitRIOL  0.25 mcg Oral Daily  ? calcium acetate  1,334 mg Oral TID WC  ? carvedilol  6.25 mg Oral BID WC  ? Chlorhexidine Gluconate Cloth  6 each Topical Q0600  ? cholecalciferol  1,000 Units Oral Daily  ? heparin  5,000 Units Subcutaneous Q8H  ? hydrALAZINE  50 mg Oral Q8H  ? insulin aspart  0-6 Units Subcutaneous TID WC  ? insulin glargine-yfgn  10 Units Subcutaneous QHS  ? isosorbide mononitrate  30 mg Oral Daily  ? multivitamin  1 tablet Oral QHS  ? ?Continuous Infusions: ? sodium chloride    ? sodium chloride    ? ?PRN Meds:.sodium chloride, sodium chloride, acetaminophen, alteplase, diphenhydrAMINE-zinc acetate, heparin, hydrALAZINE, lidocaine (PF), lidocaine-prilocaine, ondansetron (ZOFRAN) IV, oxyCODONE-acetaminophen, pentafluoroprop-tetrafluoroeth   ?Pearson Grippe  MD ?12/12/2021, 8:04 AM ?  ?

## 2021-12-12 NOTE — Progress Notes (Signed)
PROGRESS NOTE                                                                                                                                                                                                             Patient Demographics:    Randy Ross, is a 51 y.o. male, DOB - Feb 21, 1971, ZOX:[id number]  Outpatient Primary MD for the patient is Sueanne Margarita, DO    LOS - 4  Admit date - 12/08/2021    Chief Complaint  Patient presents with   Acute Renal Failure       Brief Narrative (HPI from H&P)  - 51 y.o. male with medical history significant of CKD stage V initially diagnosed in 2020, IgA MGUS for which she follows with oncologist in [place], chronic microcytic anemia, IDDM, HTN, diabetic neuropathy, he unfortunately could not follow-up with his nephrologist for a while during [place].  In October 2022 when he saw his nephrologist back he was told that he would require dialysis soon.  He had a routine appointment with his nephrologist on 12/08/2021 and upon lab work was requested to come to the hospital to initiate HD.   Subjective:   Patient in bed, appears comfortable, denies any headache, no fever, no chest pain or pressure, no shortness of breath , no abdominal pain. No new focal weakness.  Undergoing dialysis was nauseated last evening but better now.  No nausea or constipation.   Assessment  & Plan :     CKD 5 now has progressed to ESRD.  He underwent right tunneled HD catheter placement on 12/09/2021 with start of HD treatments, vascular surgery and nephrology on board, undergoing possible left arm AV fistula placement on 12/10/2021.  2.  Hypomagnesemia and hypocalcemia.  Replaced.  Will defer electrolytes to nephrology team.  3.  MGUS with chronic proteinuria.  Follow-up with hematology oncology at [facility] post discharge, nephrology consulted here.  4.  Acute on chronic anemia.  He has anemia of  chronic disease due to underlying MGUS and ESRD, 1 unit packed RBC transfusion on 12/10/2021 and another unit on 12/12/2021, no signs of active bleeding.  Will monitor.  5.  Hypoalbuminemia due to proteinuria and MGUS.  Protein supplementation.  6.  Hypertension.  Continue home addition of Norvasc, Coreg and Imdur, hydralazine dose increased and as needed hydralazine added for better control.  With HD blood pressure should improve.  7.  Obesity.  BMI 30.  Follow with PCP for weight loss.  8.  Nausea vomiting.  Likely due to uremia.  Abdominal exam benign, symptom-free on 12/12/2021.  We will continue to monitor closely with supportive care.  If he has any further episodes will consider some imaging  9. DM type II.  On Lantus and sliding scale will monitor and adjust.  Lab Results  Component Value Date   HGBA1C 5.9 (H) 12/08/2021   CBG (last 3)  Recent Labs    12/11/21 1153 12/11/21 1756 12/11/21 2111  GLUCAP 167* 112* 175*         Condition - Fair  Family Communication  :  wife bedside on 12/09/2021 and 12/10/2021  Code Status :  Full  Consults  :  IR, Renal vascular surgery  PUD Prophylaxis :    Procedures  :     OR today for left arm avf vs avg  Tunneled HD catheter requested by IR on 12/09/2021.    [place] - There is no hydronephrosis. There is increased cortical echogenicity in the kidneys suggesting possible medical renal disease      Disposition Plan  :    Status is: Inpatient  DVT Prophylaxis  :    heparin injection 5,000 Units Start: 12/08/21 2200   Lab Results  Component Value Date   PLT 292 12/12/2021    Diet :  Diet Order             Diet renal with fluid restriction Fluid restriction: 1500 mL Fluid; Room service appropriate? Yes; Fluid consistency: Thin  Diet effective now                    Inpatient Medications  Scheduled Meds:  sodium chloride   Intravenous Once   amLODipine  10 mg Oral QHS   calcitRIOL  0.25 mcg Oral Daily   calcium  acetate  1,334 mg Oral TID WC   carvedilol  6.25 mg Oral BID WC   Chlorhexidine Gluconate Cloth  6 each Topical Q0600   cholecalciferol  1,000 Units Oral Daily   darbepoetin (ARANESP) injection - DIALYSIS  100 mcg Intravenous Q Sat-HD   heparin  5,000 Units Subcutaneous Q8H   hydrALAZINE  50 mg Oral Q8H   insulin aspart  0-6 Units Subcutaneous TID WC   insulin glargine-yfgn  10 Units Subcutaneous QHS   isosorbide mononitrate  30 mg Oral Daily   multivitamin  1 tablet Oral QHS   Continuous Infusions:  sodium chloride     sodium chloride     PRN Meds:.sodium chloride, sodium chloride, acetaminophen, alteplase, diphenhydrAMINE-zinc acetate, heparin, hydrALAZINE, lidocaine (PF), lidocaine-prilocaine, ondansetron (ZOFRAN) IV, oxyCODONE-acetaminophen, pentafluoroprop-tetrafluoroeth  Time Spent in minutes  30   Lala Lund M.D on 12/12/2021 at 9:41 AM  To page go to [email or web address]   [facility] -  Office  [phone]  See all Orders from today for further details    Objective:   Vitals:   12/12/21 0800 12/12/21 0830 12/12/21 0900 12/12/21 0930  BP: (!) 177/93 (!) 181/86 (!) 177/96 (!) 168/86  Pulse: 79 80 77 81  Resp: (!) '23  20 16  ' Temp:      TempSrc:      SpO2:      Weight:      Height:        Wt Readings from Last 3 Encounters:  12/12/21 86 kg  08/09/19 82.2 kg  Intake/Output Summary (Last 24 hours) at 12/12/2021 0941 Last data filed at 12/12/2021 0500 Gross per 24 hour  Intake 325 ml  Output --  Net 325 ml     Physical Exam  Awake Alert, No new F.N deficits, Normal affect, right IJ tunneled dialysis catheter in place. [place].AT,PERRAL Supple Neck, No JVD,   Symmetrical Chest wall movement, Good air movement bilaterally, CTAB RRR,No Gallops, Rubs or new Murmurs,  +ve B.Sounds, Abd Soft, No tenderness,   No Cyanosis, Clubbing or edema        Data Review:    CBC Recent Labs  Lab 12/08/21 1444 12/08/21 1725 12/09/21 0538 12/10/21 0431  12/10/21 1444 12/11/21 0149 12/12/21 0107  WBC 12.4*  --  13.3* 12.3*  --  11.0* 12.4*  HGB 7.1*   < > 7.1* 6.3* 7.1* 7.0* 6.8*  HCT 21.1*   < > 21.2* 19.1* 21.2* 21.1* 20.8*  PLT 330  --  328 309  --  272 292  MCV 61.5*  --  61.1* 61.2*  --  63.6* 65.0*  MCH 20.7*  --  20.5* 20.2*  --  21.1* 21.3*  MCHC 33.6  --  33.5 33.0  --  33.2 32.7  RDW 21.2*  --  21.2* 21.0*  --  22.1* 22.0*  LYMPHSABS 1.3  --   --  1.1  --  1.4 1.6  MONOABS 0.7  --   --  1.0  --  0.9 1.1*  EOSABS 0.4  --   --  0.3  --  0.3 0.4  BASOSABS 0.0  --   --  0.0  --  0.0 0.0   < > = values in this interval not displayed.    Electrolytes Recent Labs  Lab 12/08/21 1444 12/08/21 1725 12/08/21 2020 12/09/21 0538 12/10/21 0431 12/11/21 0149 12/12/21 0107  NA 138 138  --  139 139 138 138  K 4.2 4.2  --  4.5 3.9 3.9 4.2  CL 108 107  --  108 108 102 105  CO2 18*  --   --  16* '22 27 24  ' GLUCOSE 109* 145*  --  87 109* 113* 76  BUN 106* 113*  --  107* 68* 40* 53*  CREATININE 16.51* >18.00*  --  16.31* 12.02* 8.21* 10.37*  CALCIUM 6.3*  --   --  6.5* 6.6* 7.7* 7.8*  AST 15  --   --  '18 21 15 ' 13*  ALT 25  --   --  '29 25 11 8  ' ALKPHOS 61  --   --  56 53 57 57  BILITOT 0.3  --   --  0.1* 0.1* 0.3 0.4  ALBUMIN 2.5*  --   --  2.4* 2.1* 2.0* 2.1*  MG  --   --   --  1.5* 1.7 1.6* 2.2  HGBA1C  --   --  5.9*  --   --   --   --     ------------------------------------------------------------------------------------------------------------------ No results for input(s): CHOL, HDL, LDLCALC, TRIG, CHOLHDL, LDLDIRECT in the last 72 hours.  Lab Results  Component Value Date   HGBA1C 5.9 (H) 12/08/2021    No results for input(s): TSH, T4TOTAL, T3FREE, THYROIDAB in the last 72 hours.  Invalid input(s): FREET3 ------------------------------------------------------------------------------------------------------------------ ID Labs Recent Labs  Lab 12/08/21 1444 12/08/21 1725 12/09/21 0538 12/10/21 0431  12/11/21 0149 12/12/21 0107  WBC 12.4*  --  13.3* 12.3* 11.0* 12.4*  PLT 330  --  328 309 272 292  CREATININE 16.51* >18.00* 16.31* 12.02* 8.21* 10.37*   Cardiac Enzymes No results for input(s): CKMB, TROPONINI, MYOGLOBIN in the last 168 hours.  Invalid input(s): CK   Radiology Reports US RENAL  Result Date: 12/08/2021 CLINICAL DATA:  Renal dysfunction EXAM: RENAL / URINARY TRACT ULTRASOUND COMPLETE COMPARISON:  None. FINDINGS: Right Kidney: Renal measurements: 9 x 4.1 x 4.3 cm = volume: 83.57 mL. There is increased cortical echogenicity. There is no hydronephrosis. Left Kidney: Renal measurements: 8.7 x 5.3 x 4.5 cm = volume: 109.98 mL. There is increased cortical echogenicity. There is no hydronephrosis. Bladder: Appears normal for degree of bladder distention. Other: None. IMPRESSION: There is no hydronephrosis. There is increased cortical echogenicity in the kidneys suggesting possible medical renal disease. Electronically Signed   By: Elmer Picker M.D.   On: 12/08/2021 18:16   IR Fluoro Guide CV Line Right  Result Date: 12/09/2021 INDICATION: 51 year old male with history of end-stage renal disease requiring central venous access for initiation of hemodialysis. EXAM: TUNNELED CENTRAL VENOUS HEMODIALYSIS CATHETER PLACEMENT WITH ULTRASOUND AND FLUOROSCOPIC GUIDANCE MEDICATIONS: Ancef 2 gm IV . The antibiotic was given in an appropriate time interval prior to skin puncture. ANESTHESIA/SEDATION: Moderate (conscious) sedation was employed during this procedure. A total of Versed 1 mg and Fentanyl 50 mcg was administered intravenously. Moderate Sedation Time: 13 minutes. The patient's level of consciousness and vital signs were monitored continuously by radiology nursing throughout the procedure under my direct supervision. FLUOROSCOPY TIME:  0 minutes 6 seconds (1 mGy). COMPLICATIONS: None immediate. PROCEDURE: Informed written consent was obtained from the patient after a discussion of the  risks, benefits, and alternatives to treatment. Questions regarding the procedure were encouraged and answered. The right neck and chest were prepped with chlorhexidine in a sterile fashion, and a sterile drape was applied covering the operative field. Maximum barrier sterile technique with sterile gowns and gloves were used for the procedure. A timeout was performed prior to the initiation of the procedure. After creating a small venotomy incision, a 21 gauge micropuncture kit was utilized to access the internal jugular vein. Real-time ultrasound guidance was utilized for vascular access including the acquisition of a permanent ultrasound image documenting patency of the accessed vessel. A Rosen wire was advanced to the level of the IVC and the micropuncture sheath was exchanged for an 8 Fr dilator. A 14.5 French tunneled hemodialysis catheter measuring 19 cm from tip to cuff was tunneled in a retrograde fashion from the anterior chest wall to the venotomy incision. Serial dilation was then performed an a peel-away sheath was placed. The catheter was then placed through the peel-away sheath with the catheter tip ultimately positioned within the right atrium. Final catheter positioning was confirmed and documented with a spot radiographic image. The catheter aspirates and flushes normally. The catheter was flushed with appropriate volume heparin dwells. The catheter exit site was secured with a 0-Silk retention suture. The venotomy incision was closed with Dermabond. Sterile dressings were applied. The patient tolerated the procedure well without immediate post procedural complication. IMPRESSION: Successful placement of 19 cm tip to cuff tunneled hemodialysis catheter via the right internal jugular vein with catheter tip terminating within the right atrium. The catheter is ready for immediate use. Ruthann Cancer, MD [facility] Radiology Electronically Signed   By:  Ruthann Cancer M.D.   On: 12/09/2021 15:03   IR US Guide Vasc Access Right  Result Date: 12/09/2021 INDICATION: 51 year old male with history of end-stage renal disease requiring central  venous access for initiation of hemodialysis. EXAM: TUNNELED CENTRAL VENOUS HEMODIALYSIS CATHETER PLACEMENT WITH ULTRASOUND AND FLUOROSCOPIC GUIDANCE MEDICATIONS: Ancef 2 gm IV . The antibiotic was given in an appropriate time interval prior to skin puncture. ANESTHESIA/SEDATION: Moderate (conscious) sedation was employed during this procedure. A total of Versed 1 mg and Fentanyl 50 mcg was administered intravenously. Moderate Sedation Time: 13 minutes. The patient's level of consciousness and vital signs were monitored continuously by radiology nursing throughout the procedure under my direct supervision. FLUOROSCOPY TIME:  0 minutes 6 seconds (1 mGy). COMPLICATIONS: None immediate. PROCEDURE: Informed written consent was obtained from the patient after a discussion of the risks, benefits, and alternatives to treatment. Questions regarding the procedure were encouraged and answered. The right neck and chest were prepped with chlorhexidine in a sterile fashion, and a sterile drape was applied covering the operative field. Maximum barrier sterile technique with sterile gowns and gloves were used for the procedure. A timeout was performed prior to the initiation of the procedure. After creating a small venotomy incision, a 21 gauge micropuncture kit was utilized to access the internal jugular vein. Real-time ultrasound guidance was utilized for vascular access including the acquisition of a permanent ultrasound image documenting patency of the accessed vessel. A Rosen wire was advanced to the level of the IVC and the micropuncture sheath was exchanged for an 8 Fr dilator. A 14.5 French tunneled hemodialysis catheter measuring 19 cm from tip to cuff was tunneled in a retrograde fashion from the anterior chest wall to the venotomy  incision. Serial dilation was then performed an a peel-away sheath was placed. The catheter was then placed through the peel-away sheath with the catheter tip ultimately positioned within the right atrium. Final catheter positioning was confirmed and documented with a spot radiographic image. The catheter aspirates and flushes normally. The catheter was flushed with appropriate volume heparin dwells. The catheter exit site was secured with a 0-Silk retention suture. The venotomy incision was closed with Dermabond. Sterile dressings were applied. The patient tolerated the procedure well without immediate post procedural complication. IMPRESSION: Successful placement of 19 cm tip to cuff tunneled hemodialysis catheter via the right internal jugular vein with catheter tip terminating within the right atrium. The catheter is ready for immediate use. Ruthann Cancer, MD [facility] Radiology Electronically Signed   By: Ruthann Cancer M.D.   On: 12/09/2021 15:03   DG Chest Port 1 View  Result Date: 12/09/2021 CLINICAL DATA:  51 year old male with shortness of breath. EXAM: PORTABLE CHEST 1 VIEW COMPARISON:  None. FINDINGS: Portable AP semi upright view at 0624 hours. Mildly rotated to the right. Heart size at the upper limits of normal. Somewhat low lung volumes. Other mediastinal contours are within normal limits. Visualized tracheal air column is within normal limits. Allowing for portable technique the lungs are clear. No pneumothorax or pleural effusion. No osseous abnormality identified. Negative visible bowel gas. IMPRESSION: No acute cardiopulmonary abnormality. Electronically Signed   By: Genevie Ann M.D.   On: 12/09/2021 07:19

## 2021-12-12 NOTE — Anesthesia Postprocedure Evaluation (Signed)
Anesthesia Post Note ? ?Patient: Randy Ross ? ?Procedure(s) Performed: LEFT RADIAL CEPHALIC  ARTERIOVENOUS (AV) FISTULA (Left: Arm Lower) ? ?  ? ?Patient location during evaluation: PACU ?Anesthesia Type: Regional and MAC ?Level of consciousness: awake and alert ?Pain management: pain level controlled ?Vital Signs Assessment: post-procedure vital signs reviewed and stable ?Respiratory status: spontaneous breathing, nonlabored ventilation, respiratory function stable and patient connected to nasal cannula oxygen ?Cardiovascular status: stable and blood pressure returned to baseline ?Postop Assessment: no apparent nausea or vomiting ?Anesthetic complications: no ? ? ?No notable events documented. ? ?Last Vitals:  ?Vitals:  ? 12/12/21 1100 12/12/21 1142  ?BP: (!) 183/87 (!) 180/88  ?Pulse: 81 83  ?Resp: (!) 21 18  ?Temp: 36.8 ?C 36.9 ?C  ?SpO2: 100% 97%  ?  ?Last Pain:  ?Vitals:  ? 12/12/21 1142  ?TempSrc: Oral  ?PainSc:   ? ? ?  ?  ?  ?  ?  ?  ? ?Srinidhi Landers ? ? ? ? ?

## 2021-12-13 LAB — COMPREHENSIVE METABOLIC PANEL
ALT: 12 U/L (ref 0–44)
AST: 22 U/L (ref 15–41)
Albumin: 2.1 g/dL — ABNORMAL LOW (ref 3.5–5.0)
Alkaline Phosphatase: 60 U/L (ref 38–126)
Anion gap: 9 (ref 5–15)
BUN: 38 mg/dL — ABNORMAL HIGH (ref 6–20)
CO2: 26 mmol/L (ref 22–32)
Calcium: 8.1 mg/dL — ABNORMAL LOW (ref 8.9–10.3)
Chloride: 102 mmol/L (ref 98–111)
Creatinine, Ser: 8.17 mg/dL — ABNORMAL HIGH (ref 0.61–1.24)
GFR, Estimated: 7 mL/min — ABNORMAL LOW (ref >60)
Glucose, Bld: 96 mg/dL (ref 70–99)
Potassium: 4.2 mmol/L (ref 3.5–5.1)
Sodium: 137 mmol/L (ref 135–145)
Total Bilirubin: <0.1 mg/dL — ABNORMAL LOW (ref 0.3–1.2)
Total Protein: 5.5 g/dL — ABNORMAL LOW (ref 6.5–8.1)

## 2021-12-13 LAB — TYPE AND SCREEN
ABO/RH(D): B POS
Antibody Screen: NEGATIVE
Unit division: 0
Unit division: 0
Unit division: 0

## 2021-12-13 LAB — MAGNESIUM: Magnesium: 1.9 mg/dL (ref 1.7–2.4)

## 2021-12-13 LAB — BPAM RBC
Blood Product Expiration Date: 202303042359
Blood Product Expiration Date: 202303092359
Blood Product Expiration Date: 202303182359
ISSUE DATE / TIME: 202303020653
ISSUE DATE / TIME: 202303040348
ISSUE DATE / TIME: 202303041340
Unit Type and Rh: 7300
Unit Type and Rh: 7300
Unit Type and Rh: 9500

## 2021-12-13 LAB — CBC WITH DIFFERENTIAL/PLATELET
Abs Immature Granulocytes: 0.08 10*3/uL — ABNORMAL HIGH (ref 0.00–0.07)
Basophils Absolute: 0 10*3/uL (ref 0.0–0.1)
Basophils Relative: 0 %
Eosinophils Absolute: 0.4 10*3/uL (ref 0.0–0.5)
Eosinophils Relative: 3 %
HCT: 25 % — ABNORMAL LOW (ref 39.0–52.0)
Hemoglobin: 8.1 g/dL — ABNORMAL LOW (ref 13.0–17.0)
Immature Granulocytes: 1 %
Lymphocytes Relative: 14 %
Lymphs Abs: 1.7 10*3/uL (ref 0.7–4.0)
MCH: 21.9 pg — ABNORMAL LOW (ref 26.0–34.0)
MCHC: 32.4 g/dL (ref 30.0–36.0)
MCV: 67.6 fL — ABNORMAL LOW (ref 80.0–100.0)
Monocytes Absolute: 1 10*3/uL (ref 0.1–1.0)
Monocytes Relative: 8 %
Neutro Abs: 9.2 10*3/uL — ABNORMAL HIGH (ref 1.7–7.7)
Neutrophils Relative %: 74 %
Platelets: 300 10*3/uL (ref 150–400)
RBC: 3.7 MIL/uL — ABNORMAL LOW (ref 4.22–5.81)
RDW: 22.6 % — ABNORMAL HIGH (ref 11.5–15.5)
WBC: 12.5 10*3/uL — ABNORMAL HIGH (ref 4.0–10.5)
nRBC: 0 % (ref 0.0–0.2)

## 2021-12-13 LAB — GLUCOSE, CAPILLARY
Glucose-Capillary: 124 mg/dL — ABNORMAL HIGH (ref 70–99)
Glucose-Capillary: 93 mg/dL (ref 70–99)
Glucose-Capillary: 135 mg/dL — ABNORMAL HIGH (ref 70–99)
Glucose-Capillary: 157 mg/dL — ABNORMAL HIGH (ref 70–99)

## 2021-12-13 NOTE — Progress Notes (Signed)
PROGRESS NOTE                                                                                                                                                                                                             Patient Demographics:    Randy Ross, is a 51 y.o. male, DOB - December 13, 1970, WVT:[id number]  Outpatient Primary MD for the patient is Sueanne Margarita, DO    LOS - 5  Admit date - 12/08/2021    Chief Complaint  Patient presents with   Acute Renal Failure       Brief Narrative (HPI from H&P)  - 51 y.o. male with medical history significant of CKD stage V initially diagnosed in 2020, IgA MGUS for which she follows with oncologist in [place], chronic microcytic anemia, IDDM, HTN, diabetic neuropathy, he unfortunately could not follow-up with his nephrologist for a while during [place].  In October 2022 when he saw his nephrologist back he was told that he would require dialysis soon.  He had a routine appointment with his nephrologist on 12/08/2021 and upon lab work was requested to come to the hospital to initiate HD.   Subjective:   Patient in bed, appears comfortable, denies any headache, no fever, no chest pain or pressure, no shortness of breath , no abdominal pain. No new focal weakness.   Assessment  & Plan :     CKD 5 now has progressed to ESRD.  He underwent right tunneled HD catheter placement on 12/09/2021 with start of HD treatments, vascular surgery and nephrology on board, he is s/p left arm AV fistula placement on 12/10/2021.  Clinically stable await CLIP process.  2.  Hypomagnesemia and hypocalcemia.  Replaced.  Will defer electrolytes to nephrology team.  3.  MGUS with chronic proteinuria.  Follow-up with hematology oncology at [facility] post discharge, nephrology consulted here.  4.  Acute on chronic anemia.  He has anemia of chronic disease due to underlying MGUS and ESRD, 1 unit packed  RBC transfusion on 12/10/2021 and another unit on 12/12/2021, no signs of active bleeding.  Will monitor.  5.  Hypoalbuminemia due to proteinuria and MGUS.  Protein supplementation.  6.  Hypertension.  Continue home addition of Norvasc, Coreg and Imdur, hydralazine dose increased and as needed hydralazine added for better control.  With HD blood pressure should improve.  7.  Obesity.  BMI 30.  Follow with PCP for weight loss.  8.  Nausea vomiting.  Likely due to uremia.  Abdominal exam benign, symptom-free on 12/12/2021.  We will continue to monitor closely with supportive care.  If he has any further episodes will consider some imaging, currently seems to have resolved.  9. DM type II.  On Lantus and sliding scale will monitor and adjust.  Lab Results  Component Value Date   HGBA1C 5.9 (H) 12/08/2021   CBG (last 3)  Recent Labs    12/12/21 1557 12/12/21 2038 12/13/21 0800  GLUCAP 175* 210* 124*         Condition - Fair  Family Communication  :  wife bedside on 12/09/2021 and 12/10/2021,  12/13/2021 she would like to get a work note upon the date when patient is discharged.  Code Status :  Full  Consults  :  IR, Renal vascular surgery  PUD Prophylaxis :    Procedures  :     Left arm avf 12/10/2021  Tunneled HD catheter requested by IR on 12/09/2021.    [place] - There is no hydronephrosis. There is increased cortical echogenicity in the kidneys suggesting possible medical renal disease      Disposition Plan  :    Status is: Inpatient  DVT Prophylaxis  :    heparin injection 5,000 Units Start: 12/08/21 2200   Lab Results  Component Value Date   PLT 300 12/13/2021    Diet :  Diet Order             Diet renal with fluid restriction Fluid restriction: 1500 mL Fluid; Room service appropriate? Yes; Fluid consistency: Thin  Diet effective now                    Inpatient Medications  Scheduled Meds:  sodium chloride   Intravenous Once   amLODipine  10 mg Oral QHS    calcitRIOL  0.25 mcg Oral Daily   calcium acetate  1,334 mg Oral TID WC   carvedilol  6.25 mg Oral BID WC   Chlorhexidine Gluconate Cloth  6 each Topical Q0600   cholecalciferol  1,000 Units Oral Daily   darbepoetin (ARANESP) injection - DIALYSIS  100 mcg Intravenous Q Sat-HD   heparin  5,000 Units Subcutaneous Q8H   hydrALAZINE  50 mg Oral Q8H   insulin aspart  0-6 Units Subcutaneous TID WC   insulin glargine-yfgn  10 Units Subcutaneous QHS   isosorbide mononitrate  30 mg Oral Daily   multivitamin  1 tablet Oral QHS   Continuous Infusions:  sodium chloride     sodium chloride     PRN Meds:.sodium chloride, sodium chloride, acetaminophen, alteplase, diphenhydrAMINE-zinc acetate, heparin, hydrALAZINE, lidocaine (PF), lidocaine-prilocaine, ondansetron (ZOFRAN) IV, oxyCODONE-acetaminophen, pentafluoroprop-tetrafluoroeth  Time Spent in minutes  30   Lala Lund M.D on 12/13/2021 at 9:59 AM  To page go to [email or web address]   [facility] -  Office  [phone]  See all Orders from today for further details    Objective:   Vitals:   12/13/21 0000 12/13/21 0343 12/13/21 0620 12/13/21 0801  BP: (!) 152/68 (!) 150/61 (!) 167/80 (!) 152/74  Pulse: 80 91 82 87  Resp:  '19 18 17  ' Temp:  99.1 F (37.3 C)  99.3 F (37.4 C)  TempSrc:  Oral  Oral  SpO2: 96% 96% 98% 98%  Weight:      Height:        Wt  Readings from Last 3 Encounters:  12/12/21 83.8 kg  08/09/19 82.2 kg     Intake/Output Summary (Last 24 hours) at 12/13/2021 0959 Last data filed at 12/13/2021 0300 Gross per 24 hour  Intake 480 ml  Output 2000 ml  Net -1520 ml     Physical Exam  Awake Alert, No new F.N deficits, right IJ tunneled dialysis catheter in place. [place].AT,PERRAL Supple Neck, No JVD,   Symmetrical Chest wall movement, Good air movement bilaterally, CTAB RRR,No Gallops, Rubs or new Murmurs,  +ve B.Sounds, Abd Soft, No tenderness,   No Cyanosis, Clubbing or edema     Data Review:     CBC Recent Labs  Lab 12/08/21 1444 12/08/21 1725 12/09/21 0538 12/10/21 0431 12/10/21 1444 12/11/21 0149 12/12/21 0107 12/13/21 0153  WBC 12.4*  --  13.3* 12.3*  --  11.0* 12.4* 12.5*  HGB 7.1*   < > 7.1* 6.3* 7.1* 7.0* 6.8* 8.1*  HCT 21.1*   < > 21.2* 19.1* 21.2* 21.1* 20.8* 25.0*  PLT 330  --  328 309  --  272 292 300  MCV 61.5*  --  61.1* 61.2*  --  63.6* 65.0* 67.6*  MCH 20.7*  --  20.5* 20.2*  --  21.1* 21.3* 21.9*  MCHC 33.6  --  33.5 33.0  --  33.2 32.7 32.4  RDW 21.2*  --  21.2* 21.0*  --  22.1* 22.0* 22.6*  LYMPHSABS 1.3  --   --  1.1  --  1.4 1.6 1.7  MONOABS 0.7  --   --  1.0  --  0.9 1.1* 1.0  EOSABS 0.4  --   --  0.3  --  0.3 0.4 0.4  BASOSABS 0.0  --   --  0.0  --  0.0 0.0 0.0   < > = values in this interval not displayed.    Electrolytes Recent Labs  Lab 12/08/21 2020 12/09/21 0538 12/10/21 0431 12/11/21 0149 12/12/21 0107 12/13/21 0153  NA  --  139 139 138 138 137  K  --  4.5 3.9 3.9 4.2 4.2  CL  --  108 108 102 105 102  CO2  --  16* '22 27 24 26  ' GLUCOSE  --  87 109* 113* 76 96  BUN  --  107* 68* 40* 53* 38*  CREATININE  --  16.31* 12.02* 8.21* 10.37* 8.17*  CALCIUM  --  6.5* 6.6* 7.7* 7.8* 8.1*  AST  --  '18 21 15 ' 13* 22  ALT  --  '29 25 11 8 12  ' ALKPHOS  --  56 53 57 57 60  BILITOT  --  0.1* 0.1* 0.3 0.4 <0.1*  ALBUMIN  --  2.4* 2.1* 2.0* 2.1* 2.1*  MG  --  1.5* 1.7 1.6* 2.2 1.9  HGBA1C 5.9*  --   --   --   --   --     ------------------------------------------------------------------------------------------------------------------ No results for input(s): CHOL, HDL, LDLCALC, TRIG, CHOLHDL, LDLDIRECT in the last 72 hours.  Lab Results  Component Value Date   HGBA1C 5.9 (H) 12/08/2021    No results for input(s): TSH, T4TOTAL, T3FREE, THYROIDAB in the last 72 hours.  Invalid input(s): FREET3 ------------------------------------------------------------------------------------------------------------------ ID Labs Recent Labs  Lab  12/09/21 0538 12/10/21 0431 12/11/21 0149 12/12/21 0107 12/13/21 0153  WBC 13.3* 12.3* 11.0* 12.4* 12.5*  PLT 328 309 272 292 300  CREATININE 16.31* 12.02* 8.21* 10.37* 8.17*   Cardiac Enzymes No results for input(s): CKMB, TROPONINI, MYOGLOBIN in  the last 168 hours.  Invalid input(s): CK   Radiology Reports IR Fluoro Guide CV Line Right  Result Date: 12/09/2021 INDICATION: 51 year old male with history of end-stage renal disease requiring central venous access for initiation of hemodialysis. EXAM: TUNNELED CENTRAL VENOUS HEMODIALYSIS CATHETER PLACEMENT WITH ULTRASOUND AND FLUOROSCOPIC GUIDANCE MEDICATIONS: Ancef 2 gm IV . The antibiotic was given in an appropriate time interval prior to skin puncture. ANESTHESIA/SEDATION: Moderate (conscious) sedation was employed during this procedure. A total of Versed 1 mg and Fentanyl 50 mcg was administered intravenously. Moderate Sedation Time: 13 minutes. The patient's level of consciousness and vital signs were monitored continuously by radiology nursing throughout the procedure under my direct supervision. FLUOROSCOPY TIME:  0 minutes 6 seconds (1 mGy). COMPLICATIONS: None immediate. PROCEDURE: Informed written consent was obtained from the patient after a discussion of the risks, benefits, and alternatives to treatment. Questions regarding the procedure were encouraged and answered. The right neck and chest were prepped with chlorhexidine in a sterile fashion, and a sterile drape was applied covering the operative field. Maximum barrier sterile technique with sterile gowns and gloves were used for the procedure. A timeout was performed prior to the initiation of the procedure. After creating a small venotomy incision, a 21 gauge micropuncture kit was utilized to access the internal jugular vein. Real-time ultrasound guidance was utilized for vascular access including the acquisition of a permanent ultrasound image documenting patency of the accessed  vessel. A Rosen wire was advanced to the level of the IVC and the micropuncture sheath was exchanged for an 8 Fr dilator. A 14.5 French tunneled hemodialysis catheter measuring 19 cm from tip to cuff was tunneled in a retrograde fashion from the anterior chest wall to the venotomy incision. Serial dilation was then performed an a peel-away sheath was placed. The catheter was then placed through the peel-away sheath with the catheter tip ultimately positioned within the right atrium. Final catheter positioning was confirmed and documented with a spot radiographic image. The catheter aspirates and flushes normally. The catheter was flushed with appropriate volume heparin dwells. The catheter exit site was secured with a 0-Silk retention suture. The venotomy incision was closed with Dermabond. Sterile dressings were applied. The patient tolerated the procedure well without immediate post procedural complication. IMPRESSION: Successful placement of 19 cm tip to cuff tunneled hemodialysis catheter via the right internal jugular vein with catheter tip terminating within the right atrium. The catheter is ready for immediate use. Ruthann Cancer, MD [facility] Radiology Electronically Signed   By: Ruthann Cancer M.D.   On: 12/09/2021 15:03   IR US Guide Vasc Access Right  Result Date: 12/09/2021 INDICATION: 51 year old male with history of end-stage renal disease requiring central venous access for initiation of hemodialysis. EXAM: TUNNELED CENTRAL VENOUS HEMODIALYSIS CATHETER PLACEMENT WITH ULTRASOUND AND FLUOROSCOPIC GUIDANCE MEDICATIONS: Ancef 2 gm IV . The antibiotic was given in an appropriate time interval prior to skin puncture. ANESTHESIA/SEDATION: Moderate (conscious) sedation was employed during this procedure. A total of Versed 1 mg and Fentanyl 50 mcg was administered intravenously. Moderate Sedation Time: 13 minutes. The patient's level of consciousness and vital  signs were monitored continuously by radiology nursing throughout the procedure under my direct supervision. FLUOROSCOPY TIME:  0 minutes 6 seconds (1 mGy). COMPLICATIONS: None immediate. PROCEDURE: Informed written consent was obtained from the patient after a discussion of the risks, benefits, and alternatives to treatment. Questions regarding the procedure were encouraged and answered. The right neck and chest were  prepped with chlorhexidine in a sterile fashion, and a sterile drape was applied covering the operative field. Maximum barrier sterile technique with sterile gowns and gloves were used for the procedure. A timeout was performed prior to the initiation of the procedure. After creating a small venotomy incision, a 21 gauge micropuncture kit was utilized to access the internal jugular vein. Real-time ultrasound guidance was utilized for vascular access including the acquisition of a permanent ultrasound image documenting patency of the accessed vessel. A Rosen wire was advanced to the level of the IVC and the micropuncture sheath was exchanged for an 8 Fr dilator. A 14.5 French tunneled hemodialysis catheter measuring 19 cm from tip to cuff was tunneled in a retrograde fashion from the anterior chest wall to the venotomy incision. Serial dilation was then performed an a peel-away sheath was placed. The catheter was then placed through the peel-away sheath with the catheter tip ultimately positioned within the right atrium. Final catheter positioning was confirmed and documented with a spot radiographic image. The catheter aspirates and flushes normally. The catheter was flushed with appropriate volume heparin dwells. The catheter exit site was secured with a 0-Silk retention suture. The venotomy incision was closed with Dermabond. Sterile dressings were applied. The patient tolerated the procedure well without immediate post procedural complication. IMPRESSION: Successful placement of 19 cm tip to cuff  tunneled hemodialysis catheter via the right internal jugular vein with catheter tip terminating within the right atrium. The catheter is ready for immediate use. Ruthann Cancer, MD [facility] Radiology Electronically Signed   By: Ruthann Cancer M.D.   On: 12/09/2021 15:03

## 2021-12-13 NOTE — Progress Notes (Signed)
Patient's girlfriend/fiance requests note for work. She is very upset and tearful at bedside because she said she has been asking for this for 4 days.  ?

## 2021-12-13 NOTE — Progress Notes (Signed)
Admit: 12/08/2021 ?LOS: 5 ? ?15M new ESRD, uremic, starting iHD ? ?Subjective:  ? ?NO interval issues, family in room ?HD #3 yesterday: 2L UF ? ?Scheduled Meds: ? sodium chloride   Intravenous Once  ? amLODipine  10 mg Oral QHS  ? calcitRIOL  0.25 mcg Oral Daily  ? calcium acetate  1,334 mg Oral TID WC  ? carvedilol  6.25 mg Oral BID WC  ? Chlorhexidine Gluconate Cloth  6 each Topical Q0600  ? cholecalciferol  1,000 Units Oral Daily  ? darbepoetin (ARANESP) injection - DIALYSIS  100 mcg Intravenous Q Sat-HD  ? heparin  5,000 Units Subcutaneous Q8H  ? hydrALAZINE  50 mg Oral Q8H  ? insulin aspart  0-6 Units Subcutaneous TID WC  ? insulin glargine-yfgn  10 Units Subcutaneous QHS  ? isosorbide mononitrate  30 mg Oral Daily  ? multivitamin  1 tablet Oral QHS  ? ?Continuous Infusions: ? sodium chloride    ? sodium chloride    ? ?PRN Meds:.sodium chloride, sodium chloride, acetaminophen, alteplase, diphenhydrAMINE-zinc acetate, heparin, hydrALAZINE, lidocaine (PF), lidocaine-prilocaine, ondansetron (ZOFRAN) IV, oxyCODONE-acetaminophen, pentafluoroprop-tetrafluoroeth ? ?Current Labs: reviewed  ? Latest Reference Range & Units 12/09/21 09:18  ?Saturation Ratios 17.9 - 39.5 % 50 (H)  ?Ferritin 24 - 336 ng/mL 253  ?(H): Data is abnormally high ? ? Latest Reference Range & Units 12/09/21 09:18  ?PTH, Intact 15 - 65 pg/mL 266 (H)  ?(H): Data is abnormally high ? ? ?Physical Exam:  Blood pressure (!) 152/74, pulse 87, temperature 99.3 ?F (37.4 ?C), temperature source Oral, resp. rate 17, height '5\' 5"'$  (1.651 m), weight 83.8 kg, SpO2 98 %. ?NAD ?RRR no rub ?CTAB nl WOB ?S/nt/nd ?R IJ TDC bandaged/ c/d/I ?L RC AVF + bruit and thrill ?Nl mood affect ?1+ LEE ?Nonfocal ? ?A ?15M new ESRD, uremic ? ?New ESRD ?S/p [facility] 12/09/21 R IJ with IR ?L RC AVF VVS 12/10/21 ?HD#1 3/1; #2 3/2, #3 3/4 ?Cont on THS schedule: 2K 350/500, 3h, 2L UF, no heparin, 2.5 Ca ?CLIP in process, declined on TCU ?HTN / Hypervolemia: improving with HD / UF ?ANemia of  CKD,  TSAT 50%, not likley to respond to Fe. 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  PTH as above.  Ca low, not surprising.  Cont C3 ?DM2, cause of his ESRD ? ? ?P ?As above ?Medication Issues; ?Preferred narcotic agents for pain control are hydromorphone, fentanyl, and methadone. Morphine should not be used.  ?Baclofen should be avoided ?Avoid oral sodium phosphate and magnesium citrate based laxatives / bowel preps  ? ? ?Pearson Grippe MD ?12/13/2021, 10:14 AM ? ?Recent Labs  ?Lab 12/09/21 ?3354 12/10/21 ?5625 12/11/21 ?0149 12/12/21 ?0107 12/13/21 ?0153  ?NA  --    < > 138 138 137  ?K  --    < > 3.9 4.2 4.2  ?CL  --    < > 102 105 102  ?CO2  --    < > '27 24 26  '$ ?GLUCOSE  --    < > 113* 76 96  ?BUN  --    < > 40* 53* 38*  ?CREATININE  --    < > 8.21* 10.37* 8.17*  ?CALCIUM  --    < > 7.7* 7.8* 8.1*  ?PHOS 9.4*  --   --   --   --   ? < > = values in this interval not displayed.  ? ? ?Recent Labs  ?Lab 12/11/21 ?0149 12/12/21 ?0107 12/13/21 ?0153  ?  WBC 11.0* 12.4* 12.5*  ?NEUTROABS 8.3* 9.3* 9.2*  ?HGB 7.0* 6.8* 8.1*  ?HCT 21.1* 20.8* 25.0*  ?MCV 63.6* 65.0* 67.6*  ?PLT 272 292 300  ? ? ? ? ? ? ? ? ? ? ? ? ?

## 2021-12-14 LAB — CBC
HCT: 24.2 % — ABNORMAL LOW (ref 39.0–52.0)
Hemoglobin: 8.1 g/dL — ABNORMAL LOW (ref 13.0–17.0)
MCH: 22.4 pg — ABNORMAL LOW (ref 26.0–34.0)
MCHC: 33.5 g/dL (ref 30.0–36.0)
MCV: 67 fL — ABNORMAL LOW (ref 80.0–100.0)
Platelets: 342 10*3/uL (ref 150–400)
RBC: 3.61 MIL/uL — ABNORMAL LOW (ref 4.22–5.81)
RDW: 22.6 % — ABNORMAL HIGH (ref 11.5–15.5)
WBC: 13.1 10*3/uL — ABNORMAL HIGH (ref 4.0–10.5)
nRBC: 0 % (ref 0.0–0.2)

## 2021-12-14 LAB — GLUCOSE, CAPILLARY
Glucose-Capillary: 154 mg/dL — ABNORMAL HIGH (ref 70–99)
Glucose-Capillary: 103 mg/dL — ABNORMAL HIGH (ref 70–99)
Glucose-Capillary: 166 mg/dL — ABNORMAL HIGH (ref 70–99)
Glucose-Capillary: 137 mg/dL — ABNORMAL HIGH (ref 70–99)

## 2021-12-14 MED ORDER — DIPHENHYDRAMINE HCL 25 MG PO CAPS
25.0000 mg | ORAL_CAPSULE | Freq: Once | ORAL | Status: AC | PRN
  Administered 2021-12-14: 25 mg via ORAL
  Filled 2021-12-14: qty 1

## 2021-12-14 MED ORDER — CHLORHEXIDINE GLUCONATE CLOTH 2 % EX PADS
6.0000 | MEDICATED_PAD | Freq: Every day | CUTANEOUS | Status: DC
  Administered 2021-12-14 – 2021-12-15 (×2): 6 via TOPICAL

## 2021-12-14 NOTE — Progress Notes (Signed)
Physical Therapy Treatment ?Patient Details ?Name: Randy Ross ?MRN: [id number] ?DOB: 12-02-1970 ?Today's Date: 12/14/2021 ? ? ?History of Present Illness Pt is a 51 y.o. M who was sent from nephrology office for worsening of kidney function starting inpatient dialysis 12/08/2021. S/p [facility] 12/09/21 R IJ. S/p R AC AVF VVS 12/10/21. HD #1 3/1. Significant PMH: DM2, HTN, CKD stage IV, IgA MGUS, chronic microcytic anemia, diabetic neuropathy. ? ?  ?PT Comments  ? ? Pt admitted with above diagnosis. Pt was able to progress ambulation with cane and demonstrates good safety overall with use of cane. Pt performed DGI as well and scored 13/24 on DGI suggesting at risk for falls without device.  Set a goal for balance today and feel that pt would benefit from Outpt PT for balance training.  Will continue to follow acutely.  Pt currently with functional limitations due to balance and endurance deficits. Pt will benefit from skilled PT to increase their independence and safety with mobility to allow discharge to the venue listed below.      ?Recommendations for follow up therapy are one component of a multi-disciplinary discharge planning process, led by the attending physician.  Recommendations may be updated based on patient status, additional functional criteria and insurance authorization. ? ?Follow Up Recommendations ? Outpatient PT ?  ?  ?Assistance Recommended at Discharge PRN  ?Patient can return home with the following Assist for transportation;Assistance with cooking/housework ?  ?Equipment Recommendations ? Cane  ?  ?Recommendations for Other Services   ? ? ?  ?Precautions / Restrictions Precautions ?Precautions: Fall ?Restrictions ?Weight Bearing Restrictions: No  ?  ? ?Mobility ? Bed Mobility ?Overal bed mobility: Modified Independent ?  ?  ?  ?  ?  ?  ?  ?  ? ?Transfers ?Overall transfer level: Independent ?Equipment used: None ?  ?  ?  ?  ?  ?  ?  ?  ?  ? ?Ambulation/Gait ?Ambulation/Gait assistance:  Supervision ?Gait Distance (Feet): 350 Feet ?Assistive device: Straight cane ?Gait Pattern/deviations: Step-through pattern, Decreased stride length, Trunk flexed, Step-to pattern ?Gait velocity: decreased ?Gait velocity interpretation: 1.31 - 2.62 ft/sec, indicative of limited community ambulator ?  ?General Gait Details: Single UE support, step to vs step through pattern, slow speed for age, slight trunk flexion ? ? ?Stairs ?  ?  ?  ?  ?  ? ? ?Wheelchair Mobility ?  ? ?Modified Rankin (Stroke Patients Only) ?  ? ? ?  ?Balance Overall balance assessment: Needs assistance ?Sitting-balance support: Feet supported, No upper extremity supported ?Sitting balance-Leahy Scale: Normal ?Sitting balance - Comments: able to don socks edge of bed ?  ?Standing balance support: No upper extremity supported, During functional activity ?Standing balance-Leahy Scale: Fair ?Standing balance comment: Static balance without UE support, needs at least 1 UE support for dynamic activity. ?  ?  ?  ?  ?  ?  ?  ?  ?Standardized Balance Assessment ?Standardized Balance Assessment : Dynamic Gait Index ?  ?Dynamic Gait Index ?Level Surface: Mild Impairment ?Change in Gait Speed: Mild Impairment ?Gait with Horizontal Head Turns: Moderate Impairment ?Gait with Vertical Head Turns: Mild Impairment ?Gait and Pivot Turn: Mild Impairment ?Step Over Obstacle: Moderate Impairment ?Step Around Obstacles: Mild Impairment ?Steps: Moderate Impairment ?Total Score: 13 ?  ? ?  ?Cognition Arousal/Alertness: Awake/alert ?Behavior During Therapy: [facility] for tasks assessed/performed ?Overall Cognitive Status: Within Functional Limits for tasks assessed ?  ?  ?  ?  ?  ?  ?  ?  ?  ?  ?  ?  ?  ?  ?  ?  ?  ?  ?  ? ?  ?  Exercises   ? ?  ?General Comments   ?  ?  ? ?Pertinent Vitals/Pain Pain Assessment ?Pain Assessment: No/denies pain  ? ? ?Home Living   ?  ?  ?  ?  ?  ?  ?  ?  ?  ?   ?  ?Prior Function    ?  ?  ?   ? ?PT Goals (current goals can now be found in the  care plan section) Progress towards PT goals: Progressing toward goals ? ?  ?Frequency ? ? ? Min 2X/week ? ? ? ?  ?PT Plan Discharge plan needs to be updated  ? ? ?Co-evaluation   ?  ?  ?  ?  ? ?  ?AM-PAC PT "6 Clicks" Mobility   ?Outcome Measure ? Help needed turning from your back to your side while in a flat bed without using bedrails?: None ?Help needed moving from lying on your back to sitting on the side of a flat bed without using bedrails?: None ?Help needed moving to and from a bed to a chair (including a wheelchair)?: None ?Help needed standing up from a chair using your arms (e.g., wheelchair or bedside chair)?: None ?Help needed to walk in hospital room?: A Little ?Help needed climbing 3-5 steps with a railing? : A Little ?6 Click Score: 22 ? ?  ?End of Session Equipment Utilized During Treatment: Gait belt ?Activity Tolerance: Patient tolerated treatment well ?Patient left: in bed;with call bell/phone within reach;with family/visitor present ?Nurse Communication: Mobility status ?PT Visit Diagnosis: Unsteadiness on feet (R26.81);Difficulty in walking, not elsewhere classified (R26.2) ?  ? ? ?Time: 2841-3244 ?PT Time Calculation (min) (ACUTE ONLY): 13 min ? ?Charges:  $Gait Training: 8-22 mins          ?          ? ?Ziare Cryder M,PT ?Acute Rehab Services ?[phone] ?[phone] (pager)  ? ? ?Alvira Philips ?12/14/2021, 3:48 PM ? ?

## 2021-12-14 NOTE — Progress Notes (Signed)
[facility] ?ROUNDING NOTE  ? ?Subjective:  ? ?Interval History: New start end-stage renal disease.  Secondary to hypertension diabetes.  Has received 3 treatments of hemodialysis.  Clip process in place.  AV fistula placed 12/10/2021.  Continues on TTS schedule. ? ?Blood pressure 157/83 pulse 81 temperature 98.2 O2 sats 98% 3 L nasal cannula.  Last dialysis 12/12/2021 with 2 L removed. ? ?Status post transfusion 12/12/2021 and 12/10/2021 ? ?Sodium 137 potassium 4.2 chloride 102 CO2 26 BUN 38 creatinine 8 glucose 96 calcium 8.1 magnesium 1.9 albumin 2.1 hemoglobin 8.1 ? ? ?Objective:  ?Vital signs in last 24 hours:  ?Temp:  [98 ?F (36.7 ?C)-99.1 ?F (37.3 ?C)] 98.2 ?F (36.8 ?C) (03/06 6979) ?Pulse Rate:  [76-84] 81 (03/06 0811) ?Resp:  [13-18] 16 (03/06 4801) ?BP: (147-160)/(77-89) 157/83 (03/06 6553) ?SpO2:  [96 %-99 %] 98 % (03/06 0811) ? ?Weight change:  ?Filed Weights  ? 12/10/21 1800 12/12/21 0741 12/12/21 1100  ?Weight: 85.6 kg 86 kg 83.8 kg  ? ? ?Intake/Output: ?I/O last 3 completed shifts: ?In: 480 [P.O.:480] ?Out: -  ?  ?Intake/Output this shift: ? No intake/output data recorded. ? ?CVS- RRR left AV fistula. ?RS- CTA ?ABD- BS present soft non-distended ?EXT-1+ lower extremity edema. ? ? ?Basic Metabolic Panel: ?Recent Labs  ?Lab 12/09/21 ?7482 12/09/21 ?7078 12/10/21 ?6754 12/11/21 ?0149 12/12/21 ?0107 12/13/21 ?0153  ?NA 139  --  139 138 138 137  ?K 4.5  --  3.9 3.9 4.2 4.2  ?CL 108  --  108 102 105 102  ?CO2 16*  --  '22 27 24 26  '$ ?GLUCOSE 87  --  109* 113* 76 96  ?BUN 107*  --  68* 40* 53* 38*  ?CREATININE 16.31*  --  12.02* 8.21* 10.37* 8.17*  ?CALCIUM 6.5*  --  6.6* 7.7* 7.8* 8.1*  ?MG 1.5*  --  1.7 1.6* 2.2 1.9  ?PHOS  --  9.4*  --   --   --   --   ? ? ?Liver Function Tests: ?Recent Labs  ?Lab 12/09/21 ?4920 12/10/21 ?1007 12/11/21 ?0149 12/12/21 ?0107 12/13/21 ?0153  ?AST '18 21 15 '$ 13* 22  ?ALT '29 25 11 8 12  '$ ?ALKPHOS 56 53 57 57 60  ?BILITOT 0.1* 0.1* 0.3 0.4 <0.1*  ?PROT 5.7* 5.4* 5.1* 5.4*  5.5*  ?ALBUMIN 2.4* 2.1* 2.0* 2.1* 2.1*  ? ?Recent Labs  ?Lab 12/08/21 ?1444  ?LIPASE 118*  ? ?No results for input(s): AMMONIA in the last 168 hours. ? ?CBC: ?Recent Labs  ?Lab 12/08/21 ?1444 12/08/21 ?1725 12/10/21 ?0431 12/10/21 ?1444 12/11/21 ?0149 12/12/21 ?0107 12/13/21 ?0153 12/14/21 ?1219  ?WBC 12.4*   < > 12.3*  --  11.0* 12.4* 12.5* 13.1*  ?NEUTROABS 9.8*  --  9.9*  --  8.3* 9.3* 9.2*  --   ?HGB 7.1*   < > 6.3* 7.1* 7.0* 6.8* 8.1* 8.1*  ?HCT 21.1*   < > 19.1* 21.2* 21.1* 20.8* 25.0* 24.2*  ?MCV 61.5*   < > 61.2*  --  63.6* 65.0* 67.6* 67.0*  ?PLT 330   < > 309  --  272 292 300 342  ? < > = values in this interval not displayed.  ? ? ?Cardiac Enzymes: ?No results for input(s): CKTOTAL, CKMB, CKMBINDEX, TROPONINI in the last 168 hours. ? ?BNP: ?Invalid input(s): POCBNP ? ?CBG: ?Recent Labs  ?Lab 12/12/21 ?2038 12/13/21 ?0800 12/13/21 ?1202 12/13/21 ?1601 12/13/21 ?2004  ?GLUCAP 210* 124* 93 135* 157*  ? ? ?Microbiology: ?Results  for orders placed or performed during the hospital encounter of 12/08/21  ?Resp Panel by RT-PCR (Flu A&B, Covid) Nasopharyngeal Swab     Status: None  ? Collection Time: 12/09/21  9:18 AM  ? Specimen: Nasopharyngeal Swab; Nasopharyngeal(NP) swabs in vial transport medium  ?Result Value Ref Range Status  ? SARS Coronavirus 2 by RT PCR NEGATIVE NEGATIVE Final  ?  Comment: (NOTE) ?SARS-CoV-2 target nucleic acids are NOT DETECTED. ? ?The SARS-CoV-2 RNA is generally detectable in upper respiratory ?specimens during the acute phase of infection. The lowest ?concentration of SARS-CoV-2 viral copies this assay can detect is ?138 copies/mL. A negative result does not preclude SARS-Cov-2 ?infection and should not be used as the sole basis for treatment or ?other patient management decisions. A negative result may occur with  ?improper specimen collection/handling, submission of specimen other ?than nasopharyngeal swab, presence of viral mutation(s) within the ?areas targeted by this assay, and  inadequate number of viral ?copies(<138 copies/mL). A negative result must be combined with ?clinical observations, patient history, and epidemiological ?information. The expected result is Negative. ? ?Fact Sheet for Patients:  ?EntrepreneurPulse.com.au ? ?Fact Sheet for Healthcare Providers:  ?IncredibleEmployment.be ? ?This test is no t yet approved or cleared by the [place] FDA and  ?has been authorized for detection and/or diagnosis of SARS-CoV-2 by ?FDA under an Emergency Use Authorization (EUA). This EUA will remain  ?in effect (meaning this test can be used) for the duration of the ?COVID-19 declaration under Section 564(b)(1) of the Act, 21 ?U.S.C.section 360bbb-3(b)(1), unless the authorization is terminated  ?or revoked sooner.  ? ? ?  ? Influenza A by PCR NEGATIVE NEGATIVE Final  ? Influenza B by PCR NEGATIVE NEGATIVE Final  ?  Comment: (NOTE) ?The Xpert Xpress SARS-CoV-2/FLU/RSV plus assay is intended as an aid ?in the diagnosis of influenza from Nasopharyngeal swab specimens and ?should not be used as a sole basis for treatment. Nasal washings and ?aspirates are unacceptable for Xpert Xpress SARS-CoV-2/FLU/RSV ?testing. ? ?Fact Sheet for Patients: ?EntrepreneurPulse.com.au ? ?Fact Sheet for Healthcare Providers: ?IncredibleEmployment.be ? ?This test is not yet approved or cleared by the [place] FDA and ?has been authorized for detection and/or diagnosis of SARS-CoV-2 by ?FDA under an Emergency Use Authorization (EUA). This EUA will remain ?in effect (meaning this test can be used) for the duration of the ?COVID-19 declaration under Section 564(b)(1) of the Act, 21 U.S.C. ?section 360bbb-3(b)(1), unless the authorization is terminated or ?revoked. ? ?Performed at [facility], [place] [address] [place], [place] ?[address] ?  ?Surgical pcr screen     Status: None  ? Collection Time: 12/10/21  8:08 AM  ? Specimen: Nasal  Mucosa; Nasal Swab  ?Result Value Ref Range Status  ? MRSA, PCR NEGATIVE NEGATIVE Final  ? Staphylococcus aureus NEGATIVE NEGATIVE Final  ?  Comment: (NOTE) ?The Xpert SA Assay (FDA approved for NASAL specimens in patients 51 ?years of age and older), is one component of a comprehensive ?surveillance program. It is not intended to diagnose infection nor to ?guide or monitor treatment. ?Performed at [facility], [place] [address], [place], [place] ?[address] ?  ? ? ?Coagulation Studies: ?No results for input(s): LABPROT, INR in the last 72 hours. ? ?Urinalysis: ?No results for input(s): COLORURINE, LABSPEC, [place], GLUCOSEU, HGBUR, BILIRUBINUR, KETONESUR, PROTEINUR, UROBILINOGEN, NITRITE, LEUKOCYTESUR in the last 72 hours. ? ?Invalid input(s): APPERANCEUR  ? ? ?Imaging: ?No results found. ? ? ?Medications:  ? ? sodium chloride    ?  sodium chloride    ? ? sodium chloride   Intravenous Once  ? amLODipine  10 mg Oral QHS  ? calcitRIOL  0.25 mcg Oral Daily  ? calcium acetate  1,334 mg Oral TID WC  ? carvedilol  6.25 mg Oral BID WC  ? Chlorhexidine Gluconate Cloth  6 each Topical Q0600  ? cholecalciferol  1,000 Units Oral Daily  ? darbepoetin (ARANESP) injection - DIALYSIS  100 mcg Intravenous Q Sat-HD  ? heparin  5,000 Units Subcutaneous Q8H  ? hydrALAZINE  50 mg Oral Q8H  ? insulin aspart  0-6 Units Subcutaneous TID WC  ? insulin glargine-yfgn  10 Units Subcutaneous QHS  ? isosorbide mononitrate  30 mg Oral Daily  ? multivitamin  1 tablet Oral QHS  ? ?sodium chloride, sodium chloride, acetaminophen, alteplase, diphenhydrAMINE-zinc acetate, heparin, hydrALAZINE, lidocaine (PF), lidocaine-prilocaine, ondansetron (ZOFRAN) IV, oxyCODONE-acetaminophen, pentafluoroprop-tetrafluoroeth ? ?Assessment/ Plan:  ?ESRD-new end-stage renal disease status post AV fistula placement 12/10/2021.  Next dialysis will be 12/15/2021.  Clip process in place. ?ANEMIA-continue darbepoetin weekly. ?MBD-calcium acetate binders. ?HTN/VOL-volume  improved. ?ACCESS-AV fistula in place.  Dialyzing with temporary dialysis catheter. ? ? ? LOS: 6 ?Sherril Croon ?'@TODAY''@8'$ :12 AM ?  ?

## 2021-12-14 NOTE — Progress Notes (Addendum)
Contacted [facility] admissions this am. Pt's financial clearance is pending. Pt cannot be accepted at an out-pt clinic until pt receives  financial clearance. Will provide update to pt and providers as soon as clearance is received. ? ?Randy Ross ?Renal Navigator ?[phone] ? ? ?Addendum at 3:40 pm: ?Spoke with [employer] who report that they need to speak to pt/significant other further. Rep advised that pt produced legal documents if needed. Rep requested that legal document be sent to her for review with pt's permission. Met with pt and pt's sign other at bedside. Pt agreeable to document being sent to [employer] financial dept rep via text in order for pt to be approved for out-pt clinic. Information provided to rep this afternoon. Awaiting financial clearance.  ?

## 2021-12-14 NOTE — Progress Notes (Signed)
PROGRESS NOTE                                                                                                                                                                                                             Patient Demographics:    Randy Ross, is a 51 y.o. male, DOB - [birth year], AOZ:[id number]  Outpatient Primary MD for the patient is Sueanne Margarita, DO    LOS - 6  Admit date - 12/08/2021    Chief Complaint  Patient presents with   Acute Renal Failure       Brief Narrative (HPI from H&P)  - 51 y.o. male with medical history significant of CKD stage V initially diagnosed in 2020, IgA MGUS for which she follows with oncologist in [place], chronic microcytic anemia, IDDM, HTN, diabetic neuropathy, he unfortunately could not follow-up with his nephrologist for a while during [place].  In October 2022 when he saw his nephrologist back he was told that he would require dialysis soon.  He had a routine appointment with his nephrologist on 12/08/2021 and upon lab work was requested to come to the hospital to initiate HD.   Subjective:   Patient in bed in no distress denies any headache chest or abdominal pain, no further nausea vomiting.  Feels better.   Assessment  & Plan :     CKD 5 now has progressed to ESRD.  He underwent right tunneled HD catheter placement on 12/09/2021 with start of HD treatments, vascular surgery and nephrology on board, he is s/p left radial artery to cephalic vein AV fistula creation  on 12/10/2021.  Clinically stable await CLIP process.  2.  Hypomagnesemia and hypocalcemia.  Replaced.  Will defer electrolytes to nephrology team.  3.  MGUS with chronic proteinuria.  Follow-up with hematology oncology at [facility] post discharge, nephrology consulted here.  4.  Acute on chronic anemia.  He has anemia of chronic disease due to underlying MGUS and ESRD, 1 unit packed RBC transfusion  on 12/10/2021 and another unit on 12/12/2021, no signs of active bleeding.  Will monitor.  5.  Hypoalbuminemia due to proteinuria and MGUS.  Protein supplementation.  6.  Hypertension.  Continue home addition of Norvasc, Coreg and Imdur, hydralazine dose increased and as needed hydralazine added for better control.  With HD blood pressure should improve.  7.  Obesity.  BMI 30.  Follow with PCP for weight loss.  8.  Nausea vomiting.  Likely due to uremia.  Abdominal exam benign, symptom-free on 12/12/2021.  We will continue to monitor closely with supportive care.  If he has any further episodes will consider some imaging, currently seems to have resolved.  9. DM type II.  On Lantus and sliding scale will monitor and adjust.  Lab Results  Component Value Date   HGBA1C 5.9 (H) 12/08/2021   CBG (last 3)  Recent Labs    12/13/21 1601 12/13/21 2004 12/14/21 0813  GLUCAP 135* 157* 154*         Condition - Fair  Family Communication  :  wife bedside on 12/09/2021 and 12/10/2021,  12/13/2021 she would like to get a work note upon the date when patient is discharged.  Code Status :  Full  Consults  :  IR, Renal vascular surgery  PUD Prophylaxis :    Procedures  :     Left radial artery to cephalic vein AV fistula creation - 12/10/21  Tunneled HD catheter requested by IR on 12/09/2021.    [place] - There is no hydronephrosis. There is increased cortical echogenicity in the kidneys suggesting possible medical renal disease      Disposition Plan  :    Status is: Inpatient  DVT Prophylaxis  :    heparin injection 5,000 Units Start: 12/08/21 2200   Lab Results  Component Value Date   PLT 342 12/14/2021    Diet :  Diet Order             Diet renal with fluid restriction Fluid restriction: 1500 mL Fluid; Room service appropriate? Yes; Fluid consistency: Thin  Diet effective now                    Inpatient Medications  Scheduled Meds:  sodium chloride   Intravenous Once    amLODipine  10 mg Oral QHS   calcitRIOL  0.25 mcg Oral Daily   calcium acetate  1,334 mg Oral TID WC   carvedilol  6.25 mg Oral BID WC   Chlorhexidine Gluconate Cloth  6 each Topical Q0600   Chlorhexidine Gluconate Cloth  6 each Topical Q0600   cholecalciferol  1,000 Units Oral Daily   darbepoetin (ARANESP) injection - DIALYSIS  100 mcg Intravenous Q Sat-HD   heparin  5,000 Units Subcutaneous Q8H   hydrALAZINE  50 mg Oral Q8H   insulin aspart  0-6 Units Subcutaneous TID WC   insulin glargine-yfgn  10 Units Subcutaneous QHS   isosorbide mononitrate  30 mg Oral Daily   multivitamin  1 tablet Oral QHS   Continuous Infusions:  sodium chloride     sodium chloride     PRN Meds:.sodium chloride, sodium chloride, acetaminophen, alteplase, diphenhydrAMINE-zinc acetate, heparin, hydrALAZINE, lidocaine (PF), lidocaine-prilocaine, ondansetron (ZOFRAN) IV, oxyCODONE-acetaminophen, pentafluoroprop-tetrafluoroeth  Time Spent in minutes  30   Lala Lund M.D on 12/14/2021 at 10:18 AM  To page go to [email or web address]   [facility] -  Office  [phone]  See all Orders from today for further details    Objective:   Vitals:   12/13/21 1958 12/13/21 2329 12/14/21 0417 12/14/21 0811  BP: (!) 156/81 (!) 160/78 (!) 153/77 (!) 157/83  Pulse: 77 77 78 81  Resp: '18 14 16 16  '$ Temp: 98.7 F (37.1 C) 98.5 F (36.9 C) 98 F (36.7 C) 98.2 F (36.8 C)  TempSrc: Oral Oral Oral Oral  SpO2: 96% 97% 96% 98%  Weight:      Height:        Wt Readings from Last 3 Encounters:  12/12/21 83.8 kg  08/09/19 82.2 kg    No intake or output data in the 24 hours ending 12/14/21 1018    Physical Exam  Awake Alert, No new F.N deficits, right IJ tunneled dialysis catheter in place, left wrist postsurgical site stable .AT,PERRAL Supple Neck, No JVD,   Symmetrical Chest wall movement, Good air movement bilaterally, CTAB RRR,No Gallops, Rubs or new Murmurs,  +ve B.Sounds, Abd Soft, No tenderness,    No Cyanosis, Clubbing or edema      Data Review:    CBC Recent Labs  Lab 12/08/21 1444 12/08/21 1725 12/10/21 0431 12/10/21 1444 12/11/21 0149 12/12/21 0107 12/13/21 0153 12/14/21 0643  WBC 12.4*   < > 12.3*  --  11.0* 12.4* 12.5* 13.1*  HGB 7.1*   < > 6.3* 7.1* 7.0* 6.8* 8.1* 8.1*  HCT 21.1*   < > 19.1* 21.2* 21.1* 20.8* 25.0* 24.2*  PLT 330   < > 309  --  272 292 300 342  MCV 61.5*   < > 61.2*  --  63.6* 65.0* 67.6* 67.0*  MCH 20.7*   < > 20.2*  --  21.1* 21.3* 21.9* 22.4*  MCHC 33.6   < > 33.0  --  33.2 32.7 32.4 33.5  RDW 21.2*   < > 21.0*  --  22.1* 22.0* 22.6* 22.6*  LYMPHSABS 1.3  --  1.1  --  1.4 1.6 1.7  --   MONOABS 0.7  --  1.0  --  0.9 1.1* 1.0  --   EOSABS 0.4  --  0.3  --  0.3 0.4 0.4  --   BASOSABS 0.0  --  0.0  --  0.0 0.0 0.0  --    < > = values in this interval not displayed.    Electrolytes Recent Labs  Lab 12/08/21 2020 12/09/21 0538 12/10/21 0431 12/11/21 0149 12/12/21 0107 12/13/21 0153  NA  --  139 139 138 138 137  K  --  4.5 3.9 3.9 4.2 4.2  CL  --  108 108 102 105 102  CO2  --  16* '22 27 24 26  '$ GLUCOSE  --  87 109* 113* 76 96  BUN  --  107* 68* 40* 53* 38*  CREATININE  --  16.31* 12.02* 8.21* 10.37* 8.17*  CALCIUM  --  6.5* 6.6* 7.7* 7.8* 8.1*  AST  --  '18 21 15 '$ 13* 22  ALT  --  '29 25 11 8 12  '$ ALKPHOS  --  56 53 57 57 60  BILITOT  --  0.1* 0.1* 0.3 0.4 <0.1*  ALBUMIN  --  2.4* 2.1* 2.0* 2.1* 2.1*  MG  --  1.5* 1.7 1.6* 2.2 1.9  HGBA1C 5.9*  --   --   --   --   --     ------------------------------------------------------------------------------------------------------------------ No results for input(s): CHOL, HDL, LDLCALC, TRIG, CHOLHDL, LDLDIRECT in the last 72 hours.  Lab Results  Component Value Date   HGBA1C 5.9 (H) 12/08/2021    No results for input(s): TSH, T4TOTAL, T3FREE, THYROIDAB in the last 72 hours.  Invalid input(s):  FREET3 ------------------------------------------------------------------------------------------------------------------ ID Labs Recent Labs  Lab 12/09/21 0538 12/10/21 0431 12/11/21 0149 12/12/21 0107 12/13/21 0153 12/14/21 0643  WBC 13.3* 12.3* 11.0* 12.4* 12.5* 13.1*  PLT 328 309 272 292 300 342  CREATININE  16.31* 12.02* 8.21* 10.37* 8.17*  --    Cardiac Enzymes No results for input(s): CKMB, TROPONINI, MYOGLOBIN in the last 168 hours.  Invalid input(s): CK   Radiology Reports No results found.

## 2021-12-15 ENCOUNTER — Other Ambulatory Visit (HOSPITAL_COMMUNITY): Payer: Self-pay

## 2021-12-15 DIAGNOSIS — Z794 Long term (current) use of insulin: Secondary | ICD-10-CM

## 2021-12-15 DIAGNOSIS — E119 Type 2 diabetes mellitus without complications: Secondary | ICD-10-CM

## 2021-12-15 DIAGNOSIS — E1122 Type 2 diabetes mellitus with diabetic chronic kidney disease: Secondary | ICD-10-CM

## 2021-12-15 DIAGNOSIS — N186 End stage renal disease: Secondary | ICD-10-CM

## 2021-12-15 DIAGNOSIS — I10 Essential (primary) hypertension: Secondary | ICD-10-CM

## 2021-12-15 LAB — CBC
HCT: 23.9 % — ABNORMAL LOW (ref 39.0–52.0)
Hemoglobin: 7.6 g/dL — ABNORMAL LOW (ref 13.0–17.0)
MCH: 22 pg — ABNORMAL LOW (ref 26.0–34.0)
MCHC: 31.8 g/dL (ref 30.0–36.0)
MCV: 69.1 fL — ABNORMAL LOW (ref 80.0–100.0)
Platelets: ADEQUATE 10*3/uL (ref 150–400)
RBC: 3.46 MIL/uL — ABNORMAL LOW (ref 4.22–5.81)
RDW: 23.2 % — ABNORMAL HIGH (ref 11.5–15.5)
WBC: 12.1 10*3/uL — ABNORMAL HIGH (ref 4.0–10.5)
nRBC: 0.3 % — ABNORMAL HIGH (ref 0.0–0.2)

## 2021-12-15 LAB — RENAL FUNCTION PANEL
Albumin: 2.1 g/dL — ABNORMAL LOW (ref 3.5–5.0)
Anion gap: 9 (ref 5–15)
BUN: 59 mg/dL — ABNORMAL HIGH (ref 6–20)
CO2: 24 mmol/L (ref 22–32)
Calcium: 7.8 mg/dL — ABNORMAL LOW (ref 8.9–10.3)
Chloride: 103 mmol/L (ref 98–111)
Creatinine, Ser: 12.9 mg/dL — ABNORMAL HIGH (ref 0.61–1.24)
GFR, Estimated: 4 mL/min — ABNORMAL LOW (ref >60)
Glucose, Bld: 103 mg/dL — ABNORMAL HIGH (ref 70–99)
Phosphorus: 5.8 mg/dL — ABNORMAL HIGH (ref 2.5–4.6)
Potassium: 4.5 mmol/L (ref 3.5–5.1)
Sodium: 136 mmol/L (ref 135–145)

## 2021-12-15 LAB — GLUCOSE, CAPILLARY
Glucose-Capillary: 173 mg/dL — ABNORMAL HIGH (ref 70–99)
Glucose-Capillary: 101 mg/dL — ABNORMAL HIGH (ref 70–99)

## 2021-12-15 MED ORDER — HEPARIN SODIUM (PORCINE) 1000 UNIT/ML DIALYSIS
1000.0000 [IU] | INTRAMUSCULAR | Status: DC | PRN
  Filled 2021-12-15: qty 1

## 2021-12-15 MED ORDER — CALCIUM ACETATE (PHOS BINDER) 667 MG PO CAPS
1334.0000 mg | ORAL_CAPSULE | Freq: Three times a day (TID) | ORAL | 0 refills | Status: DC
  Filled 2021-12-15: qty 180, 30d supply, fill #0

## 2021-12-15 MED ORDER — SODIUM CHLORIDE 0.9 % IV SOLN: 100.0000 mL | INTRAVENOUS | Status: DC | PRN

## 2021-12-15 MED ORDER — PENTAFLUOROPROP-TETRAFLUOROETH EX AERO: 1.0000 "application " | INHALATION_SPRAY | CUTANEOUS | Status: DC | PRN

## 2021-12-15 MED ORDER — DIPHENHYDRAMINE HCL 25 MG PO CAPS
25.0000 mg | ORAL_CAPSULE | Freq: Three times a day (TID) | ORAL | Status: DC | PRN
  Administered 2021-12-15: 25 mg via ORAL
  Filled 2021-12-15: qty 1

## 2021-12-15 MED ORDER — LIDOCAINE HCL (PF) 1 % IJ SOLN: 5.0000 mL | INTRAMUSCULAR | Status: DC | PRN

## 2021-12-15 MED ORDER — CALCITRIOL 0.25 MCG PO CAPS
0.2500 ug | ORAL_CAPSULE | Freq: Every day | ORAL | 0 refills | Status: DC
Start: 2021-12-16 — End: 2023-03-08
  Filled 2021-12-15: qty 30, 30d supply, fill #0

## 2021-12-15 MED ORDER — ISOSORBIDE MONONITRATE ER 30 MG PO TB24
30.0000 mg | ORAL_TABLET | Freq: Every day | ORAL | 0 refills | Status: DC
  Filled 2021-12-15: qty 30, 30d supply, fill #0

## 2021-12-15 MED ORDER — ACETAMINOPHEN 500 MG PO TABS
500.0000 mg | ORAL_TABLET | Freq: Four times a day (QID) | ORAL | 0 refills | Status: DC | PRN
Start: 2021-12-15 — End: 2022-05-19

## 2021-12-15 MED ORDER — INSULIN GLARGINE-YFGN 100 UNIT/ML ~~LOC~~ SOPN
10.0000 [IU] | PEN_INJECTOR | Freq: Every day | SUBCUTANEOUS | 0 refills | Status: AC
  Filled 2021-12-15: qty 3, 30d supply, fill #0

## 2021-12-15 MED ORDER — RENA-VITE PO TABS
1.0000 | ORAL_TABLET | Freq: Every day | ORAL | 0 refills | Status: DC
  Filled 2021-12-15: qty 30, 30d supply, fill #0

## 2021-12-15 MED ORDER — AMLODIPINE BESYLATE 10 MG PO TABS
10.0000 mg | ORAL_TABLET | Freq: Every day | ORAL | 0 refills | Status: DC
  Filled 2021-12-15: qty 30, 30d supply, fill #0

## 2021-12-15 MED ORDER — LIDOCAINE-PRILOCAINE 2.5-2.5 % EX CREA: 1.0000 "application " | TOPICAL_CREAM | CUTANEOUS | Status: DC | PRN

## 2021-12-15 MED ORDER — INSULIN PEN NEEDLE 32G X 4 MM MISC
0 refills | Status: DC
  Filled 2021-12-15: qty 100, 30d supply, fill #0

## 2021-12-15 MED ORDER — CARVEDILOL 6.25 MG PO TABS
6.2500 mg | ORAL_TABLET | Freq: Two times a day (BID) | ORAL | 0 refills | Status: DC
Start: 2021-12-15 — End: 2022-08-25
  Filled 2021-12-15: qty 60, 30d supply, fill #0

## 2021-12-15 MED ORDER — ALTEPLASE 2 MG IJ SOLR: 2.0000 mg | Freq: Once | INTRAMUSCULAR | Status: DC | PRN

## 2021-12-15 MED ORDER — HYDRALAZINE HCL 50 MG PO TABS
50.0000 mg | ORAL_TABLET | Freq: Three times a day (TID) | ORAL | 0 refills | Status: DC
  Filled 2021-12-15: qty 90, 30d supply, fill #0

## 2021-12-15 NOTE — Progress Notes (Addendum)
[facility] ?ROUNDING NOTE  ? ?Subjective:  ? ?Interval History: New start end-stage renal disease.  Secondary to hypertension diabetes.  Has received 3 treatments of hemodialysis.  Clip process in place.  AV fistula placed 12/10/2021.  Continues on TTS schedule.  Next dialysis will be 12/15/2020 ? ?Blood pressure 172/88 pulse 79 temperature 98.2 O2 sats 98%  Last dialysis 12/12/2021 with 2 L removed. ? ?Status post transfusion 12/12/2021 and 12/10/2021 ? ?Labs pending ? ?Objective:  ?Vital signs in last 24 hours:  ?Temp:  [98.1 ?F (36.7 ?C)-98.6 ?F (37 ?C)] 98.2 ?F (36.8 ?C) (03/07 0557) ?Pulse Rate:  [75-82] 79 (03/07 0557) ?Resp:  [16-17] 17 (03/07 0557) ?BP: (138-172)/(60-88) 172/88 (03/07 0557) ?SpO2:  [94 %-99 %] 99 % (03/07 0557) ? ?Weight change:  ?Filed Weights  ? 12/10/21 1800 12/12/21 0741 12/12/21 1100  ?Weight: 85.6 kg 86 kg 83.8 kg  ? ? ?Intake/Output: ?No intake/output data recorded. ?  ?Intake/Output this shift: ? No intake/output data recorded. ? ?CVS- RRR left AV fistula. ?RS- CTA ?ABD- BS present soft non-distended ?EXT-1+ lower extremity edema. ? ? ?Basic Metabolic Panel: ?Recent Labs  ?Lab 12/09/21 ?5462 12/09/21 ?7035 12/10/21 ?0093 12/11/21 ?0149 12/12/21 ?0107 12/13/21 ?0153  ?NA 139  --  139 138 138 137  ?K 4.5  --  3.9 3.9 4.2 4.2  ?CL 108  --  108 102 105 102  ?CO2 16*  --  '22 27 24 26  '$ ?GLUCOSE 87  --  109* 113* 76 96  ?BUN 107*  --  68* 40* 53* 38*  ?CREATININE 16.31*  --  12.02* 8.21* 10.37* 8.17*  ?CALCIUM 6.5*  --  6.6* 7.7* 7.8* 8.1*  ?MG 1.5*  --  1.7 1.6* 2.2 1.9  ?PHOS  --  9.4*  --   --   --   --   ? ? ? ?Liver Function Tests: ?Recent Labs  ?Lab 12/09/21 ?8182 12/10/21 ?9937 12/11/21 ?0149 12/12/21 ?0107 12/13/21 ?0153  ?AST '18 21 15 '$ 13* 22  ?ALT '29 25 11 8 12  '$ ?ALKPHOS 56 53 57 57 60  ?BILITOT 0.1* 0.1* 0.3 0.4 <0.1*  ?PROT 5.7* 5.4* 5.1* 5.4* 5.5*  ?ALBUMIN 2.4* 2.1* 2.0* 2.1* 2.1*  ? ? ?Recent Labs  ?Lab 12/08/21 ?1444  ?LIPASE 118*  ? ? ?No results for input(s): AMMONIA  in the last 168 hours. ? ?CBC: ?Recent Labs  ?Lab 12/08/21 ?1444 12/08/21 ?1725 12/10/21 ?0431 12/10/21 ?1444 12/11/21 ?0149 12/12/21 ?0107 12/13/21 ?0153 12/14/21 ?1696  ?WBC 12.4*   < > 12.3*  --  11.0* 12.4* 12.5* 13.1*  ?NEUTROABS 9.8*  --  9.9*  --  8.3* 9.3* 9.2*  --   ?HGB 7.1*   < > 6.3* 7.1* 7.0* 6.8* 8.1* 8.1*  ?HCT 21.1*   < > 19.1* 21.2* 21.1* 20.8* 25.0* 24.2*  ?MCV 61.5*   < > 61.2*  --  63.6* 65.0* 67.6* 67.0*  ?PLT 330   < > 309  --  272 292 300 342  ? < > = values in this interval not displayed.  ? ? ? ?Cardiac Enzymes: ?No results for input(s): CKTOTAL, CKMB, CKMBINDEX, TROPONINI in the last 168 hours. ? ?BNP: ?Invalid input(s): POCBNP ? ?CBG: ?Recent Labs  ?Lab 12/13/21 ?2004 12/14/21 ?0813 12/14/21 ?1203 12/14/21 ?1556 12/14/21 ?2124  ?GLUCAP 157* 154* 103* 166* 137*  ? ? ? ?Microbiology: ?Results for orders placed or performed during the hospital encounter of 12/08/21  ?Resp Panel by RT-PCR (Flu A&B, Covid) Nasopharyngeal Swab  Status: None  ? Collection Time: 12/09/21  9:18 AM  ? Specimen: Nasopharyngeal Swab; Nasopharyngeal(NP) swabs in vial transport medium  ?Result Value Ref Range Status  ? SARS Coronavirus 2 by RT PCR NEGATIVE NEGATIVE Final  ?  Comment: (NOTE) ?SARS-CoV-2 target nucleic acids are NOT DETECTED. ? ?The SARS-CoV-2 RNA is generally detectable in upper respiratory ?specimens during the acute phase of infection. The lowest ?concentration of SARS-CoV-2 viral copies this assay can detect is ?138 copies/mL. A negative result does not preclude SARS-Cov-2 ?infection and should not be used as the sole basis for treatment or ?other patient management decisions. A negative result may occur with  ?improper specimen collection/handling, submission of specimen other ?than nasopharyngeal swab, presence of viral mutation(s) within the ?areas targeted by this assay, and inadequate number of viral ?copies(<138 copies/mL). A negative result must be combined with ?clinical observations,  patient history, and epidemiological ?information. The expected result is Negative. ? ?Fact Sheet for Patients:  ?EntrepreneurPulse.com.au ? ?Fact Sheet for Healthcare Providers:  ?IncredibleEmployment.be ? ?This test is no t yet approved or cleared by the [place] FDA and  ?has been authorized for detection and/or diagnosis of SARS-CoV-2 by ?FDA under an Emergency Use Authorization (EUA). This EUA will remain  ?in effect (meaning this test can be used) for the duration of the ?COVID-19 declaration under Section 564(b)(1) of the Act, 21 ?U.S.C.section 360bbb-3(b)(1), unless the authorization is terminated  ?or revoked sooner.  ? ? ?  ? Influenza A by PCR NEGATIVE NEGATIVE Final  ? Influenza B by PCR NEGATIVE NEGATIVE Final  ?  Comment: (NOTE) ?The Xpert Xpress SARS-CoV-2/FLU/RSV plus assay is intended as an aid ?in the diagnosis of influenza from Nasopharyngeal swab specimens and ?should not be used as a sole basis for treatment. Nasal washings and ?aspirates are unacceptable for Xpert Xpress SARS-CoV-2/FLU/RSV ?testing. ? ?Fact Sheet for Patients: ?EntrepreneurPulse.com.au ? ?Fact Sheet for Healthcare Providers: ?IncredibleEmployment.be ? ?This test is not yet approved or cleared by the [place] FDA and ?has been authorized for detection and/or diagnosis of SARS-CoV-2 by ?FDA under an Emergency Use Authorization (EUA). This EUA will remain ?in effect (meaning this test can be used) for the duration of the ?COVID-19 declaration under Section 564(b)(1) of the Act, 21 U.S.C. ?section 360bbb-3(b)(1), unless the authorization is terminated or ?revoked. ? ?Performed at [facility], [place] [address], [place], [place] ?[address] ?  ?Surgical pcr screen     Status: None  ? Collection Time: 12/10/21  8:08 AM  ? Specimen: Nasal Mucosa; Nasal Swab  ?Result Value Ref Range Status  ? MRSA, PCR NEGATIVE NEGATIVE Final  ? Staphylococcus aureus  NEGATIVE NEGATIVE Final  ?  Comment: (NOTE) ?The Xpert SA Assay (FDA approved for NASAL specimens in patients 90 ?years of age and older), is one component of a comprehensive ?surveillance program. It is not intended to diagnose infection nor to ?guide or monitor treatment. ?Performed at [facility], [place] [address], [place], [place] ?[address] ?  ? ? ?Coagulation Studies: ?No results for input(s): LABPROT, INR in the last 72 hours. ? ?Urinalysis: ?No results for input(s): COLORURINE, LABSPEC, [place], GLUCOSEU, HGBUR, BILIRUBINUR, KETONESUR, PROTEINUR, UROBILINOGEN, NITRITE, LEUKOCYTESUR in the last 72 hours. ? ?Invalid input(s): APPERANCEUR  ? ? ?Imaging: ?No results found. ? ? ?Medications:  ? ? sodium chloride    ? sodium chloride    ? ? sodium chloride   Intravenous Once  ? amLODipine  10 mg Oral QHS  ? calcitRIOL  0.25 mcg Oral Daily  ? calcium acetate  1,334 mg Oral TID WC  ? carvedilol  6.25 mg Oral BID WC  ? Chlorhexidine Gluconate Cloth  6 each Topical Q0600  ? Chlorhexidine Gluconate Cloth  6 each Topical Q0600  ? cholecalciferol  1,000 Units Oral Daily  ? darbepoetin (ARANESP) injection - DIALYSIS  100 mcg Intravenous Q Sat-HD  ? heparin  5,000 Units Subcutaneous Q8H  ? hydrALAZINE  50 mg Oral Q8H  ? insulin aspart  0-6 Units Subcutaneous TID WC  ? insulin glargine-yfgn  10 Units Subcutaneous QHS  ? isosorbide mononitrate  30 mg Oral Daily  ? multivitamin  1 tablet Oral QHS  ? ?sodium chloride, sodium chloride, acetaminophen, alteplase, diphenhydrAMINE-zinc acetate, heparin, hydrALAZINE, lidocaine (PF), lidocaine-prilocaine, ondansetron (ZOFRAN) IV, oxyCODONE-acetaminophen, pentafluoroprop-tetrafluoroeth ? ?Assessment/ Plan:  ?ESRD-new end-stage renal disease status post AV fistula placement 12/10/2021.  Next dialysis will be 12/15/2021.  Clip to [facility] MWF  could hold dialysis today if to be discharged ?ANEMIA-continue darbepoetin weekly. ?MBD-calcium acetate binders. ?HTN/VOL-volume  improved. ?ACCESS-AV fistula in place.  Dialyzing with temporary dialysis catheter. ? ? ? LOS: 7 ?Sherril Croon ?'@TODAY''@7'$ :07 AM ?  ?

## 2021-12-15 NOTE — Progress Notes (Signed)
Pt has received financial clearance from [facility] and can be accepted at [facility] on MWF. For first appt, pt will need to arrive at 11:00 to complete paperwork prior to 12:00 chair time. Pt can start at clinic tomorrow. Met with pt and pt's sign other at bedside. Both aware of schedule/clinic and agreeable to plan. Schedule letter provided to pt and arrangements added to pt's AVS as well. Contacted attending, nephrology, and [facility] staff with update as well. Inquired if pt was appropriate to d/c today without HD and start at clinic tomorrow or if HD was needed prior to d/c. Attending felt it appropriate for pt to receive HD prior to d/c. Contacted [facility] to make clinic aware pt for d/c today and will start at clinic tomorrow. Contacted renal NP to request that orders be sent to clinic. ? ?[name] ?Renal Navigator ?[phone] ?

## 2021-12-15 NOTE — Discharge Summary (Addendum)
Physician Discharge Summary  Randy Ross ZOX:[id number] DOB: 01-05-1971 DOA: 12/08/2021  PCP: Sueanne Margarita, DO  Admit date: 12/08/2021 Discharge date: 12/15/2021  Admitted From: Home Disposition:  Home   Recommendations for Outpatient Follow-up:  Follow up with PCP in 1 weeks Please obtain BMP/CBC in one week  Home Health:NO  Discharge Condition:Stable CODE STATUS:FULL Diet recommendation: Heart Healthy / Carb Modified /renal with 1200 cc fluid restrictions.  Brief/Interim Summary:  51 y.o. male with medical history significant of CKD stage V initially diagnosed in 2020, IgA MGUS for which she follows with oncologist in [place], chronic microcytic anemia, IDDM, HTN, diabetic neuropathy, he unfortunately could not follow-up with his nephrologist for a while during [place].  In October 2022 when he saw his nephrologist back he was told that he would require dialysis soon.  He had a routine appointment with his nephrologist on 12/08/2021 and upon lab work was requested to come to the hospital to initiate HD.   Discharge Diagnoses:  Principal Problem:   AKI (acute kidney injury) ([place]) Active Problems:   CKD (chronic kidney disease), stage IV (H[facility])   ESRD (end stage renal disease) ([place])   Essential hypertension   Diabetes mellitus ([place])  CKD 5 now has progressed to ESRD.   - He underwent right tunneled HD catheter placement on 12/09/2021 with start of HD treatments, vascular surgery and nephrology on board, he is s/p left radial artery to cephalic vein AV fistula creation  on 12/10/2021. - accepted at [facility] on MWF   Hypomagnesemia and hypocalcemia.  Replaced.     MGUS with chronic proteinuria.  Follow-up with hematology oncology at [facility] post discharge, nephrology consulted here.   Acute on chronic anemia.  He has anemia of chronic disease due to underlying MGUS and ESRD, 1 unit packed RBC transfusion on 12/10/2021 and another unit on 12/12/2021, no signs of active  bleeding.  Continue to monitor CBC as outpatient.   Hypoalbuminemia due to proteinuria and MGUS.  Protein supplementation.   Hypertension.  Continue home addition of Norvasc, Coreg and Imdur, hydralazine dose increased and as needed hydralazine added for better control.  With HD blood pressure should improve.   Obesity.  BMI 30.  Follow with PCP for weight loss.   Nausea vomiting.  Likely due to uremia.  Abdominal exam benign, symptom-free on 12/12/2021.  We will continue to monitor closely with supportive care.  If he has any further episodes will consider some imaging, currently seems to have resolved.   DM type II.  Sliding scale and simile during hospital stay, he will be discharged on [place].  Discharge Instructions  Discharge Instructions     Discharge instructions   Complete by: As directed    Follow with Primary MD Sueanne Margarita, DO in 7 days   Get CBC, CMP,  checked  by Primary MD next visit.    Activity: As tolerated with Full fall precautions use walker/cane & assistance as needed   Disposition Home    Diet: Heart Healthy /carb modified/renal modified with 1200 cc fluid restrictions, with feeding assistance and aspiration precautions.   On your next vIsit with your primary care physician please Get Medicines reviewed and adjusted.   Please request your Prim.MD to go over all Hospital Tests and Procedure/Radiological results at the follow up, please get all Hospital records sent to your Prim MD by signing hospital release before you go home.   If you experience worsening of your admission symptoms, develop shortness of breath, life threatening emergency,  suicidal or homicidal thoughts you must seek medical attention immediately by calling 911 or calling your MD immediately  if symptoms less severe.  You Must read complete instructions/literature along with all the possible adverse reactions/side effects for all the Medicines you take and that have been prescribed to  you. Take any new Medicines after you have completely understood and accpet all the possible adverse reactions/side effects.   Do not drive, operating heavy machinery, perform activities at heights, swimming or participation in water activities or provide baby sitting services if your were admitted for syncope or siezures until you have seen by Primary MD or a Neurologist and advised to do so again.  Do not drive when taking Pain medications.    Do not take more than prescribed Pain, Sleep and Anxiety Medications  Special Instructions: If you have smoked or chewed Tobacco  in the last 2 yrs please stop smoking, stop any regular Alcohol  and or any Recreational drug use.  Wear Seat belts while driving.   Please note  You were cared for by a hospitalist during your hospital stay. If you have any questions about your discharge medications or the care you received while you were in the hospital after you are discharged, you can call the unit and asked to speak with the hospitalist on call if the hospitalist that took care of you is not available. Once you are discharged, your primary care physician will handle any further medical issues. Please note that NO REFILLS for any discharge medications will be authorized once you are discharged, as it is imperative that you return to your primary care physician (or establish a relationship with a primary care physician if you do not have one) for your aftercare needs so that they can reassess your need for medications and monitor your lab values.   Increase activity slowly   Complete by: As directed    No wound care   Complete by: As directed       Allergies as of 12/15/2021       Reactions   Gabapentin Shortness Of Breath, Rash   Scratchy throat   Metformin And Related Other (See Comments)   Affecting kidney function        Medication List     STOP taking these medications    ferrous sulfate 325 (65 FE) MG EC tablet   furosemide 40 MG  tablet Commonly known as: LASIX   Toujeo SoloStar 300 UNIT/ML Solostar Pen Generic drug: insulin glargine (1 Unit Dial)       TAKE these medications    acetaminophen 500 MG tablet Commonly known as: TYLENOL Take 1 tablet (500 mg total) by mouth every 6 (six) hours as needed for mild pain.   amLODipine 10 MG tablet Commonly known as: NORVASC Take 1 tablet (10 mg total) by mouth at bedtime.   calcitRIOL 0.25 MCG capsule Commonly known as: ROCALTROL Take 1 capsule (0.25 mcg total) by mouth daily. Start taking on: December 16, 2021   calcium acetate 667 MG capsule Commonly known as: PHOSLO Take 2 capsules (1,334 mg total) by mouth 3 (three) times daily with meals.   carvedilol 6.25 MG tablet Commonly known as: COREG Take 1 tablet (6.25 mg total) by mouth 2 (two) times daily with a meal. What changed:  medication strength how much to take   cetaphil cream Apply 1 application topically as needed (itching).   hydrALAZINE 50 MG tablet Commonly known as: APRESOLINE Take 1 tablet (50 mg total) by  mouth 3 (three) times daily.   insulin glargine-yfgn 100 UNIT/ML Pen Commonly known as: SEMGLEE Inject 10 Units into the skin daily.   isosorbide mononitrate 30 MG 24 hr tablet Commonly known as: IMDUR Take 1 tablet (30 mg total) by mouth daily. Start taking on: December 16, 2021   multivitamin Tabs tablet Take 1 tablet by mouth at bedtime.   tetrahydrozoline-zinc 0.05-0.25 % ophthalmic solution Commonly known as: VISINE-AC Place 1 drop into both eyes 3 (three) times daily as needed (dry eyes).   tiZANidine 4 MG tablet Commonly known as: ZANAFLEX Take 4 mg by mouth 3 (three) times daily as needed for muscle spasms.   Vicks VapoRub 4.7-1.2-2.6 % Oint Apply 1 application topically daily as needed (pain).        Follow-up Information     [facility] Follow up in 6 week(s).   Why: The office will call the patient with an appointment(sent) Contact  information: [address] [place], [place]. Go on 12/16/2021.   Why: Schedule is Monday/Wednesday/Friday with 12:00 chair time.  For first appointment, patient needs to arrive at 11:00 to complete paperwork prior to treatment. Contact information: [address] [place] [address] [phone]                Allergies  Allergen Reactions   Gabapentin Shortness Of Breath and Rash    Scratchy throat    Metformin And Related Other (See Comments)    Affecting kidney function     Consultations:  - IR,  - Renal - vascular surgery  Left radial artery to cephalic vein AV fistula creation - 12/10/21   Tunneled HD catheter requested by IR on 12/09/2021.   Procedures/Studies: US RENAL  Result Date: 12/08/2021 CLINICAL DATA:  Renal dysfunction EXAM: RENAL / URINARY TRACT ULTRASOUND COMPLETE COMPARISON:  None. FINDINGS: Right Kidney: Renal measurements: 9 x 4.1 x 4.3 cm = volume: 83.57 mL. There is increased cortical echogenicity. There is no hydronephrosis. Left Kidney: Renal measurements: 8.7 x 5.3 x 4.5 cm = volume: 109.98 mL. There is increased cortical echogenicity. There is no hydronephrosis. Bladder: Appears normal for degree of bladder distention. Other: None. IMPRESSION: There is no hydronephrosis. There is increased cortical echogenicity in the kidneys suggesting possible medical renal disease. Electronically Signed   By: Elmer Picker M.D.   On: 12/08/2021 18:16   IR Fluoro Guide CV Line Right  Result Date: 12/09/2021 INDICATION: 52 year old male with history of end-stage renal disease requiring central venous access for initiation of hemodialysis. EXAM: TUNNELED CENTRAL VENOUS HEMODIALYSIS CATHETER PLACEMENT WITH ULTRASOUND AND FLUOROSCOPIC GUIDANCE MEDICATIONS: Ancef 2 gm IV . The antibiotic was given in an appropriate time interval prior to skin puncture. ANESTHESIA/SEDATION: Moderate (conscious)  sedation was employed during this procedure. A total of Versed 1 mg and Fentanyl 50 mcg was administered intravenously. Moderate Sedation Time: 13 minutes. The patient's level of consciousness and vital signs were monitored continuously by radiology nursing throughout the procedure under my direct supervision. FLUOROSCOPY TIME:  0 minutes 6 seconds (1 mGy). COMPLICATIONS: None immediate. PROCEDURE: Informed written consent was obtained from the patient after a discussion of the risks, benefits, and alternatives to treatment. Questions regarding the procedure were encouraged and answered. The right neck and chest were prepped with chlorhexidine in a sterile fashion, and a sterile drape was applied covering the operative field. Maximum barrier sterile technique with sterile gowns and gloves were used for  the procedure. A timeout was performed prior to the initiation of the procedure. After creating a small venotomy incision, a 21 gauge micropuncture kit was utilized to access the internal jugular vein. Real-time ultrasound guidance was utilized for vascular access including the acquisition of a permanent ultrasound image documenting patency of the accessed vessel. A Rosen wire was advanced to the level of the IVC and the micropuncture sheath was exchanged for an 8 Fr dilator. A 14.5 French tunneled hemodialysis catheter measuring 19 cm from tip to cuff was tunneled in a retrograde fashion from the anterior chest wall to the venotomy incision. Serial dilation was then performed an a peel-away sheath was placed. The catheter was then placed through the peel-away sheath with the catheter tip ultimately positioned within the right atrium. Final catheter positioning was confirmed and documented with a spot radiographic image. The catheter aspirates and flushes normally. The catheter was flushed with appropriate volume heparin dwells. The catheter exit site was secured with a 0-Silk retention suture. The venotomy incision  was closed with Dermabond. Sterile dressings were applied. The patient tolerated the procedure well without immediate post procedural complication. IMPRESSION: Successful placement of 19 cm tip to cuff tunneled hemodialysis catheter via the right internal jugular vein with catheter tip terminating within the right atrium. The catheter is ready for immediate use. Ruthann Cancer, MD [facility] Radiology Electronically Signed   By: Ruthann Cancer M.D.   On: 12/09/2021 15:03   IR US Guide Vasc Access Right  Result Date: 12/09/2021 INDICATION: 51 year old male with history of end-stage renal disease requiring central venous access for initiation of hemodialysis. EXAM: TUNNELED CENTRAL VENOUS HEMODIALYSIS CATHETER PLACEMENT WITH ULTRASOUND AND FLUOROSCOPIC GUIDANCE MEDICATIONS: Ancef 2 gm IV . The antibiotic was given in an appropriate time interval prior to skin puncture. ANESTHESIA/SEDATION: Moderate (conscious) sedation was employed during this procedure. A total of Versed 1 mg and Fentanyl 50 mcg was administered intravenously. Moderate Sedation Time: 13 minutes. The patient's level of consciousness and vital signs were monitored continuously by radiology nursing throughout the procedure under my direct supervision. FLUOROSCOPY TIME:  0 minutes 6 seconds (1 mGy). COMPLICATIONS: None immediate. PROCEDURE: Informed written consent was obtained from the patient after a discussion of the risks, benefits, and alternatives to treatment. Questions regarding the procedure were encouraged and answered. The right neck and chest were prepped with chlorhexidine in a sterile fashion, and a sterile drape was applied covering the operative field. Maximum barrier sterile technique with sterile gowns and gloves were used for the procedure. A timeout was performed prior to the initiation of the procedure. After creating a small venotomy incision, a 21 gauge micropuncture kit was  utilized to access the internal jugular vein. Real-time ultrasound guidance was utilized for vascular access including the acquisition of a permanent ultrasound image documenting patency of the accessed vessel. A Rosen wire was advanced to the level of the IVC and the micropuncture sheath was exchanged for an 8 Fr dilator. A 14.5 French tunneled hemodialysis catheter measuring 19 cm from tip to cuff was tunneled in a retrograde fashion from the anterior chest wall to the venotomy incision. Serial dilation was then performed an a peel-away sheath was placed. The catheter was then placed through the peel-away sheath with the catheter tip ultimately positioned within the right atrium. Final catheter positioning was confirmed and documented with a spot radiographic image. The catheter aspirates and flushes normally. The catheter was flushed with appropriate volume heparin dwells. The catheter exit  site was secured with a 0-Silk retention suture. The venotomy incision was closed with Dermabond. Sterile dressings were applied. The patient tolerated the procedure well without immediate post procedural complication. IMPRESSION: Successful placement of 19 cm tip to cuff tunneled hemodialysis catheter via the right internal jugular vein with catheter tip terminating within the right atrium. The catheter is ready for immediate use. Ruthann Cancer, MD [facility] Radiology Electronically Signed   By: Ruthann Cancer M.D.   On: 12/09/2021 15:03   DG Chest Port 1 View  Result Date: 12/09/2021 CLINICAL DATA:  51 year old male with shortness of breath. EXAM: PORTABLE CHEST 1 VIEW COMPARISON:  None. FINDINGS: Portable AP semi upright view at 0624 hours. Mildly rotated to the right. Heart size at the upper limits of normal. Somewhat low lung volumes. Other mediastinal contours are within normal limits. Visualized tracheal air column is within normal limits. Allowing for portable  technique the lungs are clear. No pneumothorax or pleural effusion. No osseous abnormality identified. Negative visible bowel gas. IMPRESSION: No acute cardiopulmonary abnormality. Electronically Signed   By: Genevie Ann M.D.   On: 12/09/2021 07:19      Subjective: No significant events overnight as discussed with staff, patient denies any complaints.  Discharge Exam: Vitals:   12/15/21 1051 12/15/21 1130  BP: (!) 148/76 124/70  Pulse:    Resp: 16   Temp:    SpO2:     Vitals:   12/15/21 0744 12/15/21 1043 12/15/21 1051 12/15/21 1130  BP: (!) 148/64 (!) 155/81 (!) 148/76 124/70  Pulse: 78     Resp: '16 15 16   ' Temp: 98.6 F (37 C) 97.9 F (36.6 C)    TempSrc: Oral Oral    SpO2: 96% 96%    Weight:  83.4 kg    Height:        General: Pt is alert, awake, not in acute distress Cardiovascular: RRR, S1/S2 +, no rubs, no gallops Respiratory: CTA bilaterally, no wheezing, no rhonchi Abdominal: Soft, NT, ND, bowel sounds + Extremities: no edema, no cyanosis    The results of significant diagnostics from this hospitalization (including imaging, microbiology, ancillary and laboratory) are listed below for reference.     Microbiology: Recent Results (from the past 240 hour(s))  Resp Panel by RT-PCR (Flu A&B, Covid) Nasopharyngeal Swab     Status: None   Collection Time: 12/09/21  9:18 AM   Specimen: Nasopharyngeal Swab; Nasopharyngeal(NP) swabs in vial transport medium  Result Value Ref Range Status   SARS Coronavirus 2 by RT PCR NEGATIVE NEGATIVE Final    Comment: (NOTE) SARS-CoV-2 target nucleic acids are NOT DETECTED.  The SARS-CoV-2 RNA is generally detectable in upper respiratory specimens during the acute phase of infection. The lowest concentration of SARS-CoV-2 viral copies this assay can detect is 138 copies/mL. A negative result does not preclude SARS-Cov-2 infection and should not be used as the sole basis for treatment or other patient management decisions. A  negative result may occur with  improper specimen collection/handling, submission of specimen other than nasopharyngeal swab, presence of viral mutation(s) within the areas targeted by this assay, and inadequate number of viral copies(<138 copies/mL). A negative result must be combined with clinical observations, patient history, and epidemiological information. The expected result is Negative.  Fact Sheet for Patients:  EntrepreneurPulse.com.au  Fact Sheet for Healthcare Providers:  IncredibleEmployment.be  This test is no t yet approved or cleared by the [place] and  has been authorized for  detection and/or diagnosis of SARS-CoV-2 by FDA under an Emergency Use Authorization (EUA). This EUA will remain  in effect (meaning this test can be used) for the duration of the COVID-19 declaration under Section 564(b)(1) of the Act, 21 U.S.C.section 360bbb-3(b)(1), unless the authorization is terminated  or revoked sooner.       Influenza A by PCR NEGATIVE NEGATIVE Final   Influenza B by PCR NEGATIVE NEGATIVE Final    Comment: (NOTE) The Xpert Xpress SARS-CoV-2/FLU/RSV plus assay is intended as an aid in the diagnosis of influenza from Nasopharyngeal swab specimens and should not be used as a sole basis for treatment. Nasal washings and aspirates are unacceptable for Xpert Xpress SARS-CoV-2/FLU/RSV testing.  Fact Sheet for Patients: EntrepreneurPulse.com.au  Fact Sheet for Healthcare Providers: IncredibleEmployment.be  This test is not yet approved or cleared by the [place] FDA and has been authorized for detection and/or diagnosis of SARS-CoV-2 by FDA under an Emergency Use Authorization (EUA). This EUA will remain in effect (meaning this test can be used) for the duration of the COVID-19 declaration under Section 564(b)(1) of the Act, 21 U.S.C. section 360bbb-3(b)(1), unless the authorization  is terminated or revoked.  Performed at [facility], [place] [address], [place], [place] [address]   Surgical pcr screen     Status: None   Collection Time: 12/10/21  8:08 AM   Specimen: Nasal Mucosa; Nasal Swab  Result Value Ref Range Status   MRSA, PCR NEGATIVE NEGATIVE Final   Staphylococcus aureus NEGATIVE NEGATIVE Final    Comment: (NOTE) The Xpert SA Assay (FDA approved for NASAL specimens in patients 3 years of age and older), is one component of a comprehensive surveillance program. It is not intended to diagnose infection nor to guide or monitor treatment. Performed at [facility], [place] [address] [place], [place] [address]      Labs: BNP (last 3 results) No results for input(s): BNP in the last 8760 hours. Basic Metabolic Panel: Recent Labs  Lab 12/09/21 0538 12/09/21 0918 12/10/21 0431 12/11/21 0149 12/12/21 0107 12/13/21 0153 12/15/21 1102  NA 139  --  139 138 138 137 136  K 4.5  --  3.9 3.9 4.2 4.2 4.5  CL 108  --  108 102 105 102 103  CO2 16*  --  '22 27 24 26 24  ' GLUCOSE 87  --  109* 113* 76 96 103*  BUN 107*  --  68* 40* 53* 38* 59*  CREATININE 16.31*  --  12.02* 8.21* 10.37* 8.17* 12.90*  CALCIUM 6.5*  --  6.6* 7.7* 7.8* 8.1* 7.8*  MG 1.5*  --  1.7 1.6* 2.2 1.9  --   PHOS  --  9.4*  --   --   --   --  5.8*   Liver Function Tests: Recent Labs  Lab 12/09/21 0538 12/10/21 0431 12/11/21 0149 12/12/21 0107 12/13/21 0153 12/15/21 1102  AST '18 21 15 ' 13* 22  --   ALT '29 25 11 8 12  ' --   ALKPHOS 56 53 57 57 60  --   BILITOT 0.1* 0.1* 0.3 0.4 <0.1*  --   PROT 5.7* 5.4* 5.1* 5.4* 5.5*  --   ALBUMIN 2.4* 2.1* 2.0* 2.1* 2.1* 2.1*   Recent Labs  Lab 12/08/21 1444  LIPASE 118*   No results for input(s): AMMONIA in the last 168 hours. CBC: Recent Labs  Lab 12/08/21 1444 12/08/21 1725 12/10/21 0431 12/10/21 1444 12/11/21 0149 12/12/21 0107 12/13/21 0153  12/14/21 0643 12/15/21 1103  WBC 12.4*   < > 12.3*  --  11.0* 12.4* 12.5*  13.1* 12.1*  NEUTROABS 9.8*  --  9.9*  --  8.3* 9.3* 9.2*  --   --   HGB 7.1*   < > 6.3*   < > 7.0* 6.8* 8.1* 8.1* 7.6*  HCT 21.1*   < > 19.1*   < > 21.1* 20.8* 25.0* 24.2* 23.9*  MCV 61.5*   < > 61.2*  --  63.6* 65.0* 67.6* 67.0* 69.1*  PLT 330   < > 309  --  272 292 300 342 PLATELET CLUMPS NOTED ON SMEAR, COUNT APPEARS ADEQUATE   < > = values in this interval not displayed.   Cardiac Enzymes: No results for input(s): CKTOTAL, CKMB, CKMBINDEX, TROPONINI in the last 168 hours. BNP: Invalid input(s): POCBNP CBG: Recent Labs  Lab 12/14/21 0813 12/14/21 1203 12/14/21 1556 12/14/21 2124 12/15/21 0747  GLUCAP 154* 103* 166* 137* 173*   D-Dimer No results for input(s): DDIMER in the last 72 hours. Hgb A1c No results for input(s): HGBA1C in the last 72 hours. Lipid Profile No results for input(s): CHOL, HDL, LDLCALC, TRIG, CHOLHDL, LDLDIRECT in the last 72 hours. Thyroid function studies No results for input(s): TSH, T4TOTAL, T3FREE, THYROIDAB in the last 72 hours.  Invalid input(s): FREET3 Anemia work up No results for input(s): VITAMINB12, FOLATE, FERRITIN, TIBC, IRON, RETICCTPCT in the last 72 hours. Urinalysis    Component Value Date/Time   COLORURINE YELLOW 12/08/2021 1505   APPEARANCEUR HAZY (A) 12/08/2021 1505   LABSPEC 1.015 12/08/2021 1505   PHURINE 5.0 12/08/2021 1505   GLUCOSEU 150 (A) 12/08/2021 1505   HGBUR NEGATIVE 12/08/2021 1505   BILIRUBINUR NEGATIVE 12/08/2021 1505   KETONESUR NEGATIVE 12/08/2021 1505   PROTEINUR >=300 (A) 12/08/2021 1505   NITRITE NEGATIVE 12/08/2021 1505   LEUKOCYTESUR NEGATIVE 12/08/2021 1505   Sepsis Labs Invalid input(s): PROCALCITONIN,  WBC,  LACTICIDVEN Microbiology Recent Results (from the past 240 hour(s))  Resp Panel by RT-PCR (Flu A&B, Covid) Nasopharyngeal Swab     Status: None   Collection Time: 12/09/21  9:18 AM   Specimen: Nasopharyngeal Swab; Nasopharyngeal(NP) swabs in vial transport medium  Result Value Ref Range  Status   SARS Coronavirus 2 by RT PCR NEGATIVE NEGATIVE Final    Comment: (NOTE) SARS-CoV-2 target nucleic acids are NOT DETECTED.  The SARS-CoV-2 RNA is generally detectable in upper respiratory specimens during the acute phase of infection. The lowest concentration of SARS-CoV-2 viral copies this assay can detect is 138 copies/mL. A negative result does not preclude SARS-Cov-2 infection and should not be used as the sole basis for treatment or other patient management decisions. A negative result may occur with  improper specimen collection/handling, submission of specimen other than nasopharyngeal swab, presence of viral mutation(s) within the areas targeted by this assay, and inadequate number of viral copies(<138 copies/mL). A negative result must be combined with clinical observations, patient history, and epidemiological information. The expected result is Negative.  Fact Sheet for Patients:  EntrepreneurPulse.com.au  Fact Sheet for Healthcare Providers:  IncredibleEmployment.be  This test is no t yet approved or cleared by the [place] FDA and  has been authorized for detection and/or diagnosis of SARS-CoV-2 by FDA under an Emergency Use Authorization (EUA). This EUA will remain  in effect (meaning this test can be used) for the duration of the COVID-19 declaration under Section 564(b)(1) of the Act, 21 U.S.C.section 360bbb-3(b)(1), unless the authorization is terminated  or revoked sooner.       Influenza A by PCR NEGATIVE NEGATIVE Final   Influenza B by PCR NEGATIVE NEGATIVE Final    Comment: (NOTE) The Xpert Xpress SARS-CoV-2/FLU/RSV plus assay is intended as an aid in the diagnosis of influenza from Nasopharyngeal swab specimens and should not be used as a sole basis for treatment. Nasal washings and aspirates are unacceptable for Xpert Xpress SARS-CoV-2/FLU/RSV testing.  Fact Sheet for  Patients: EntrepreneurPulse.com.au  Fact Sheet for Healthcare Providers: IncredibleEmployment.be  This test is not yet approved or cleared by the [place] FDA and has been authorized for detection and/or diagnosis of SARS-CoV-2 by FDA under an Emergency Use Authorization (EUA). This EUA will remain in effect (meaning this test can be used) for the duration of the COVID-19 declaration under Section 564(b)(1) of the Act, 21 U.S.C. section 360bbb-3(b)(1), unless the authorization is terminated or revoked.  Performed at [facility], [place] [address], [place], [place] [address]   Surgical pcr screen     Status: None   Collection Time: 12/10/21  8:08 AM   Specimen: Nasal Mucosa; Nasal Swab  Result Value Ref Range Status   MRSA, PCR NEGATIVE NEGATIVE Final   Staphylococcus aureus NEGATIVE NEGATIVE Final    Comment: (NOTE) The Xpert SA Assay (FDA approved for NASAL specimens in patients 53 years of age and older), is one component of a comprehensive surveillance program. It is not intended to diagnose infection nor to guide or monitor treatment. Performed at [facility], [place] [address], [place], [place] [address]      Time coordinating discharge: Over 30 minutes  SIGNED:   Phillips Climes, MD  [facility] 12/15/2021, 12:07 PM Pager   If 7PM-7AM, please contact night-coverage [email or web address] Password [email or web address]

## 2021-12-16 ENCOUNTER — Telehealth: Payer: Self-pay | Admitting: Nephrology

## 2021-12-16 NOTE — Telephone Encounter (Signed)
Transition of care contact from inpatient facility ? ?Date of Discharge: 12/15/21 ?Date of Contact: attempted 12/16/21 ?Method of contact: Phone ? ?Attempted to contact patient to discuss transition of care from inpatient admission. Patient did not answer the phone. Message was left on the patient's voicemail with contact him  at Dialysis  next tx  12/17/21  at [place] [facility]  ? ?

## 2021-12-18 LAB — HEPATITIS B SURFACE ANTIBODY,QUALITATIVE

## 2022-01-22 ENCOUNTER — Other Ambulatory Visit: Payer: Self-pay | Admitting: *Deleted

## 2022-01-22 DIAGNOSIS — N184 Chronic kidney disease, stage 4 (severe): Secondary | ICD-10-CM

## 2022-01-27 ENCOUNTER — Encounter (HOSPITAL_COMMUNITY): Payer: Self-pay

## 2022-01-29 NOTE — Progress Notes (Signed)
? ? ?  Postoperative Access Visit ? ? ?History of Present Illness  ? ?Randy Ross is a 51 y.o. year old male who presents for postoperative follow-up for: Left radiocephalic AV fistula on 11/11/17 By Dr. Donzetta Matters. The patient's wounds are well healed.  The patient notes no steal symptoms.  The patient is able to complete their activities of daily living.   ? ?He currently is dialyzing on MWF via right IJ TDC at the [facility] ? ?Physical Examination  ? ?Vitals:  ? 02/02/22 0850  ?BP: (!) 159/87  ?Pulse: 81  ?Resp: 18  ?Temp: 98.6 ?F (37 ?C)  ?TempSrc: Temporal  ?SpO2: 98%  ?Weight: 184 lb 6.4 oz (83.6 kg)  ?Height: '5\' 5"'$  (1.651 m)  ? ?Body mass index is 30.69 kg/m?. ? ?left arm Incision is well healed, 2+ radial pulse, hand grip is 5/5, sensation in digits is intact, palpable thrill, bruit can be auscultated   ? ?Non invasive Vascular lab:  ?Duplex today shows patient left radiocephalic AV fistula. Volume flow is adequate for dialysis. There are some competing branches noted ? ? ?Medical Decision Making  ? ?[name] is a 51 y.o. year old male who presents s/p Left radiocephalic AV fistula on 01/09/73 By Dr. Donzetta Matters. Incision has healed well. Clinically fistula looks excellent with great thrill. He currently is dialyzing on MWF via right IJ TDC at [facility] location.   ?Patent is without signs or symptoms of steal syndrome ?The patient's access will be ready for use 03/12/22 ?The patient's tunneled dialysis catheter can be removed when Nephrology is comfortable with the performance of the left RC fistula ?The patient may follow up on a prn basis ? ? ?Karoline Caldwell, PA-C ?[facility] ?Office: [phone] ? ?Clinic MD: Roxanne Mins ?

## 2022-01-30 ENCOUNTER — Emergency Department (HOSPITAL_COMMUNITY)
Admission: EM | Admit: 2022-01-30 | Discharge: 2022-01-30 | Disposition: A | Discharge status: Home / Self Care | Payer: Self-pay | Attending: Emergency Medicine | Admitting: Emergency Medicine

## 2022-01-30 ENCOUNTER — Other Ambulatory Visit: Payer: Self-pay

## 2022-01-30 ENCOUNTER — Encounter (HOSPITAL_COMMUNITY): Payer: Self-pay | Admitting: *Deleted

## 2022-01-30 ENCOUNTER — Emergency Department (HOSPITAL_COMMUNITY): Payer: Self-pay

## 2022-01-30 DIAGNOSIS — N189 Chronic kidney disease, unspecified: Secondary | ICD-10-CM | POA: Insufficient documentation

## 2022-01-30 DIAGNOSIS — I129 Hypertensive chronic kidney disease with stage 1 through stage 4 chronic kidney disease, or unspecified chronic kidney disease: Secondary | ICD-10-CM | POA: Insufficient documentation

## 2022-01-30 DIAGNOSIS — E1122 Type 2 diabetes mellitus with diabetic chronic kidney disease: Secondary | ICD-10-CM | POA: Insufficient documentation

## 2022-01-30 DIAGNOSIS — Z992 Dependence on renal dialysis: Secondary | ICD-10-CM | POA: Insufficient documentation

## 2022-01-30 DIAGNOSIS — Z79899 Other long term (current) drug therapy: Secondary | ICD-10-CM | POA: Insufficient documentation

## 2022-01-30 DIAGNOSIS — Z794 Long term (current) use of insulin: Secondary | ICD-10-CM | POA: Insufficient documentation

## 2022-01-30 DIAGNOSIS — L231 Allergic contact dermatitis due to adhesives: Secondary | ICD-10-CM | POA: Insufficient documentation

## 2022-01-30 DIAGNOSIS — Z91158 Patient's noncompliance with renal dialysis for other reason: Secondary | ICD-10-CM

## 2022-01-30 LAB — CBC WITH DIFFERENTIAL/PLATELET
Abs Immature Granulocytes: 0.04 10*3/uL (ref 0.00–0.07)
Basophils Absolute: 0 10*3/uL (ref 0.0–0.1)
Basophils Relative: 0 %
Eosinophils Absolute: 0.4 10*3/uL (ref 0.0–0.5)
Eosinophils Relative: 4 %
HCT: 27.1 % — ABNORMAL LOW (ref 39.0–52.0)
Hemoglobin: 8.5 g/dL — ABNORMAL LOW (ref 13.0–17.0)
Immature Granulocytes: 0 %
Lymphocytes Relative: 12 %
Lymphs Abs: 1.3 10*3/uL (ref 0.7–4.0)
MCH: 20.3 pg — ABNORMAL LOW (ref 26.0–34.0)
MCHC: 31.4 g/dL (ref 30.0–36.0)
MCV: 64.8 fL — ABNORMAL LOW (ref 80.0–100.0)
Monocytes Absolute: 0.6 10*3/uL (ref 0.1–1.0)
Monocytes Relative: 6 %
Neutro Abs: 8.3 10*3/uL — ABNORMAL HIGH (ref 1.7–7.7)
Neutrophils Relative %: 78 %
Platelets: 336 10*3/uL (ref 150–400)
RBC: 4.18 MIL/uL — ABNORMAL LOW (ref 4.22–5.81)
RDW: 23.1 % — ABNORMAL HIGH (ref 11.5–15.5)
WBC: 10.7 10*3/uL — ABNORMAL HIGH (ref 4.0–10.5)
nRBC: 0 % (ref 0.0–0.2)

## 2022-01-30 LAB — COMPREHENSIVE METABOLIC PANEL
ALT: 17 U/L (ref 0–44)
AST: 13 U/L — ABNORMAL LOW (ref 15–41)
Albumin: 2.9 g/dL — ABNORMAL LOW (ref 3.5–5.0)
Alkaline Phosphatase: 76 U/L (ref 38–126)
Anion gap: 12 (ref 5–15)
BUN: 47 mg/dL — ABNORMAL HIGH (ref 6–20)
CO2: 24 mmol/L (ref 22–32)
Calcium: 7.8 mg/dL — ABNORMAL LOW (ref 8.9–10.3)
Chloride: 102 mmol/L (ref 98–111)
Creatinine, Ser: 12.14 mg/dL — ABNORMAL HIGH (ref 0.61–1.24)
GFR, Estimated: 5 mL/min — ABNORMAL LOW (ref >60)
Glucose, Bld: 205 mg/dL — ABNORMAL HIGH (ref 70–99)
Potassium: 3.9 mmol/L (ref 3.5–5.1)
Sodium: 138 mmol/L (ref 135–145)
Total Bilirubin: 0.5 mg/dL (ref 0.3–1.2)
Total Protein: 6.5 g/dL (ref 6.5–8.1)

## 2022-01-30 NOTE — ED Provider Notes (Signed)
[facility] EMERGENCY DEPARTMENT Provider Note   CSN: [id number] Arrival date & time: 01/30/22  0209     History No chief complaint on file.   Randy Ross is a 51 y.o. male with history of CKD, anemia, hypertension, and diabetes presents the emergency department for evaluation of missed dialysis appointment yesterday.  He reports that been experiencing burning that is going to legs all the way through his admission.  He denies any fevers, chest pain, shortness of breath, nausea, or vomiting.  He reports that he missed dialysis yesterday because he was tired and did not feel like it.  He receives his dialysis from [facility].  He is only recently started dialysis within the past month.  He is currently a Monday Wednesday Friday dialysis patient.  Additionally, they are also mentioning some irritation of the skin where the bandage was placed for his port.  HPI     Home Medications Prior to Admission medications   Medication Sig Start Date End Date Taking? Authorizing Provider  acetaminophen (TYLENOL) 500 MG tablet Take 1 tablet (500 mg total) by mouth every 6 (six) hours as needed for mild pain. 12/15/21   Elgergawy, Silver Huguenin, MD  amLODipine (NORVASC) 10 MG tablet Take 1 tablet (10 mg total) by mouth at bedtime. 12/15/21   Elgergawy, Silver Huguenin, MD  calcitRIOL (ROCALTROL) 0.25 MCG capsule Take 1 capsule (0.25 mcg total) by mouth daily. 12/16/21   Elgergawy, Silver Huguenin, MD  calcium acetate (PHOSLO) 667 MG capsule Take 2 capsules (1,334 mg total) by mouth 3 (three) times daily with meals. 12/15/21   Elgergawy, Silver Huguenin, MD  Camphor-Eucalyptus-Menthol (VICKS VAPORUB) 4.7-1.2-2.6 % OINT Apply 1 application topically daily as needed (pain).    [provider]  carvedilol (COREG) 6.25 MG tablet Take 1 tablet (6.25 mg total) by mouth 2 (two) times daily with a meal. 12/15/21   Elgergawy, Silver Huguenin, MD  Emollient (CETAPHIL) cream Apply 1 application topically as  needed (itching).    [provider]  hydrALAZINE (APRESOLINE) 50 MG tablet Take 1 tablet (50 mg total) by mouth 3 (three) times daily. 12/15/21   Elgergawy, Silver Huguenin, MD  Insulin Pen Needle 32G X 4 MM MISC Use to inject insulin daily as directed. 12/15/21   Elgergawy, Silver Huguenin, MD  isosorbide mononitrate (IMDUR) 30 MG 24 hr tablet Take 1 tablet (30 mg total) by mouth daily. 12/16/21   Elgergawy, Silver Huguenin, MD  multivitamin (RENA-VIT) TABS tablet Take 1 tablet by mouth at bedtime. 12/15/21   Elgergawy, Silver Huguenin, MD  tetrahydrozoline-zinc (VISINE-AC) 0.05-0.25 % ophthalmic solution Place 1 drop into both eyes 3 (three) times daily as needed (dry eyes).    [provider]  tiZANidine (ZANAFLEX) 4 MG tablet Take 4 mg by mouth 3 (three) times daily as needed for muscle spasms.    [provider]      Allergies    Gabapentin and Metformin and related    Review of Systems   Review of Systems  Constitutional:  Negative for fever.  Respiratory:  Negative for shortness of breath.   Cardiovascular:  Negative for chest pain.  Gastrointestinal:  Negative for nausea and vomiting.   Physical Exam Updated Vital Signs BP (!) 176/91 (BP Location: Right Arm)   Pulse 82   Temp 99 F (37.2 C) (Oral)   Resp 18   Ht '5\' 5"'$  (1.651 m)   Wt 82.4 kg   SpO2 100%   BMI 30.23 kg/m  Physical Exam Vitals and nursing note reviewed.  Constitutional:      General: He is not in acute distress.    Appearance: He is not ill-appearing or toxic-appearing.  Cardiovascular:     Rate and Rhythm: Normal rate.  Pulmonary:     Effort: Pulmonary effort is normal. No respiratory distress.  Abdominal:     General: Bowel sounds are normal. There is no distension.     Palpations: Abdomen is soft.     Tenderness: There is no abdominal tenderness. There is no guarding or rebound.  Musculoskeletal:     Right lower leg: No edema.     Left lower leg: No edema.  Skin:    Comments: Patient has dialysis  catheter placed in the right upper chest.  Under the bandage there is a very small amount of erythema, consistent with contact dermatitis.  No increased warmth or skin sloughing.  I do not note any rash, color changes, ecchymosis, increased warmth, or erythema noted to the abdomen or legs.  Neurological:     Mental Status: He is alert.    ED Results / Procedures / Treatments   Labs (all labs ordered are listed, but only abnormal results are displayed) Labs Reviewed  CBC WITH DIFFERENTIAL/PLATELET - Abnormal; Notable for the following components:      Result Value   WBC 10.7 (*)    RBC 4.18 (*)    Hemoglobin 8.5 (*)    HCT 27.1 (*)    MCV 64.8 (*)    MCH 20.3 (*)    RDW 23.1 (*)    Neutro Abs 8.3 (*)    All other components within normal limits  COMPREHENSIVE METABOLIC PANEL - Abnormal; Notable for the following components:   Glucose, Bld 205 (*)    BUN 47 (*)    Creatinine, Ser 12.14 (*)    Calcium 7.8 (*)    Albumin 2.9 (*)    AST 13 (*)    GFR, Estimated 5 (*)    All other components within normal limits  PATHOLOGIST SMEAR REVIEW    EKG EKG Interpretation  Date/Time:  Saturday January 30 2022 02:56:11 EDT Ventricular Rate:  82 PR Interval:  146 QRS Duration: 84 QT Interval:  464 QTC Calculation: 542 R Axis:   64 Text Interpretation: Normal sinus rhythm Nonspecific ST abnormality Prolonged QT Abnormal ECG When compared with ECG of 08-Dec-2021 14:12, PREVIOUS ECG IS PRESENT no stemi Confirmed by Wynona Dove ([id number]) on 01/30/2022 8:03:59 AM  Radiology DG Chest 2 View  Result Date: 01/30/2022 CLINICAL DATA:  Dialysis patient, missed dialysis appointment presents with shortness of breath. EXAM: CHEST - 2 VIEW COMPARISON:  Portable chest 12/09/2021 FINDINGS: Right IJ dialysis catheter tip is in the upper right atrium, not seen on the prior study. There is no pneumothorax. There is mild cardiomegaly, normal caliber central vessels and no overt edema. The lungs show mild  chronic changes exaggerated by low lung volumes. No pleural effusion is seen.  No focal pneumonia is evident. The mediastinum is stable with mild aortic atherosclerosis in the transverse segment. Thoracic cage is intact. IMPRESSION: 1. New right IJ dialysis catheter since 12/09/2021 exam terminating in the upper right atrium. 2. Mild cardiomegaly without findings of acute CHF or edema. 3. Chronic changes. 4. Aortic atherosclerosis. Electronically Signed   By: Telford Nab M.D.   On: 01/30/2022 03:48    Procedures Procedures   Medications Ordered in ED Medications - No data to display  ED Course/ Medical Decision  Making/ A&P                           Medical Decision Making  51 year old male with history of hypertension, diabetes, on dialysis presents the emergency department for evaluation of a body burning sensation and missed dialysis appointment as well as a rash underneath his bandage.  The rash is consistent with contact dermatitis.  I suggested to the patient in wife at bedside to medicate this to the dialysis center as he may be having a reaction from the adhesive on the bandage.  Recommended that they discussed with giving him a different bandage at this dialysis treatment.  I am unsure as to this body burning sensation, likely due to his missed dialysis appointment.  I discussed this with my attending who also agrees.  Initially the patient complained of shortness of breath with triage staff which is why EKG and chest x-ray were ordered.  He did not complain about this with me.    I independently reviewed and interpreted the patient's labs and imaging and agree with the radiologist interpretation.CMP shows elevated glucose at 205.  Patient has elevated creatinine of 12.14 likely due to his missed dialysis.  Decreased calcium at 7.8.  Decreased albumin 2.9.  Decreased AST at 13.  GFR 5.  CBC shows mildly elevated leukocytosis at 10.7.  Anemia at 8.5 although this appears to be consistent  with patient's baseline.  EKG shows prolonged QT. CXR shows 1. New right IJ dialysis catheter since 12/09/2021 exam terminating in the upper right atrium. 2. Mild cardiomegaly without findings of acute CHF or edema. 3. Chronic changes. 4. Aortic atherosclerosis.  The wife reports that they would like to leave to receive dialysis at the center if they are able to.  I called [facility] and spoke to Garen Lah who discussed with me that the patient is able to receive dialysis today if he is discharged.  I discussed this plan with the patient's wife and patient.  They are agreeable to plan.  Strict return precautions were discussed.  Patient and family member agreed to plan.  Patient is stable being discharged home in good condition.  I discussed this case with my attending physician who cosigned this note including patient's presenting symptoms, physical exam, and planned diagnostics and interventions. Attending physician stated agreement with plan or made changes to plan which were implemented.   Final Clinical Impression(s) / ED Diagnoses Final diagnoses:  Dialysis patient, noncompliant  Allergic contact dermatitis due to adhesives    Rx / DC Orders ED Discharge Orders     None         Sherrell Puller, PA-C 01/31/22 1811    Wynona Dove A, DO 02/01/22 1346

## 2022-01-30 NOTE — ED Triage Notes (Signed)
The pt missed his dialysis yesterday and now he reports that his body is burning inside his body the pt has a dialysis catheter in his rt chest ?

## 2022-01-30 NOTE — Discharge Instructions (Addendum)
You were seen today for evaluation of your body aches and missed dialysis appointment.  Your lab work is stable.  Your physical exam is normal.  I have called the [facility] and they can get you an today for dialysis.  It is vitally important that you stick to your dialysis schedule and attend every appointment. Talk to the kidney center about your skin irritation from the bandage and they can place a different type for you. If you have any new or worsening symptoms, please return to the nearest emergency department for reevaluation. ?

## 2022-01-30 NOTE — ED Provider Triage Note (Signed)
Emergency Medicine Provider Triage Evaluation Note  Randy Ross , a 51 y.o. male  was evaluated in triage.  Pt complains of bodily itching on his stomach.  Recently started dialysis in the past month, missed treatment yesterday because he did not get any sleep.  States somewhat SOB.  Catheter right chest wall, new fistula left wrist but not yet matured for use.  Followed by [facility], Dr. Carolin Sicks.  Review of Systems  Positive: Itching, missed dialysis Negative: fever  Physical Exam  BP (!) 163/90   Pulse 86   Temp 99 F (37.2 C) (Oral)   Resp 18   Ht '5\' 5"'$  (1.651 m)   Wt 82.4 kg   SpO2 98%   BMI 30.23 kg/m  Gen:   Awake, no distress   Resp:  Normal effort  MSK:   Moves extremities without difficulty  Other:  Dialysis catheter right chest wall, no visible bodily rash  Medical Decision Making  Medically screening exam initiated at 2:46 AM.  Appropriate orders placed.  Randy Ross was informed that the remainder of the evaluation will be completed by another provider, this initial triage assessment does not replace that evaluation, and the importance of remaining in the ED until their evaluation is complete.  Bodily itching, missed dialysis.  Complains of some shortness of breath.  EKG, labs, CXR.   Larene Pickett, PA-C 01/30/22 [phone]

## 2022-02-01 LAB — PATHOLOGIST SMEAR REVIEW

## 2022-02-02 ENCOUNTER — Ambulatory Visit (INDEPENDENT_AMBULATORY_CARE_PROVIDER_SITE_OTHER): Payer: Self-pay | Admitting: Physician Assistant

## 2022-02-02 ENCOUNTER — Ambulatory Visit (HOSPITAL_COMMUNITY)
Admission: RE | Admit: 2022-02-02 | Discharge: 2022-02-02 | Disposition: A | Discharge status: Home / Self Care | Payer: Self-pay | Source: Ambulatory Visit | Attending: Vascular Surgery | Admitting: Vascular Surgery

## 2022-02-02 VITALS — BP 159/87 | HR 81 | Temp 98.6°F | Resp 18 | Ht 65.0 in | Wt 184.4 lb

## 2022-02-02 DIAGNOSIS — N184 Chronic kidney disease, stage 4 (severe): Secondary | ICD-10-CM | POA: Diagnosis not present

## 2022-02-02 DIAGNOSIS — N186 End stage renal disease: Secondary | ICD-10-CM

## 2022-02-02 DIAGNOSIS — Z992 Dependence on renal dialysis: Secondary | ICD-10-CM

## 2022-03-10 ENCOUNTER — Emergency Department (HOSPITAL_COMMUNITY)
Admission: EM | Admit: 2022-03-10 | Discharge: 2022-03-10 | Disposition: A | Discharge status: Home / Self Care | Payer: Self-pay | Attending: Emergency Medicine | Admitting: Emergency Medicine

## 2022-03-10 ENCOUNTER — Encounter (HOSPITAL_COMMUNITY): Payer: Self-pay | Admitting: Emergency Medicine

## 2022-03-10 ENCOUNTER — Emergency Department (HOSPITAL_COMMUNITY): Payer: Self-pay

## 2022-03-10 ENCOUNTER — Other Ambulatory Visit: Payer: Self-pay

## 2022-03-10 DIAGNOSIS — E1122 Type 2 diabetes mellitus with diabetic chronic kidney disease: Secondary | ICD-10-CM | POA: Diagnosis not present

## 2022-03-10 DIAGNOSIS — N186 End stage renal disease: Secondary | ICD-10-CM | POA: Insufficient documentation

## 2022-03-10 DIAGNOSIS — R0602 Shortness of breath: Secondary | ICD-10-CM

## 2022-03-10 DIAGNOSIS — R0789 Other chest pain: Secondary | ICD-10-CM | POA: Diagnosis not present

## 2022-03-10 DIAGNOSIS — I120 Hypertensive chronic kidney disease with stage 5 chronic kidney disease or end stage renal disease: Secondary | ICD-10-CM | POA: Insufficient documentation

## 2022-03-10 DIAGNOSIS — Z992 Dependence on renal dialysis: Secondary | ICD-10-CM | POA: Diagnosis not present

## 2022-03-10 LAB — CBC WITH DIFFERENTIAL/PLATELET
Abs Immature Granulocytes: 0.07 10*3/uL (ref 0.00–0.07)
Basophils Absolute: 0.1 10*3/uL (ref 0.0–0.1)
Basophils Relative: 0 %
Eosinophils Absolute: 0.4 10*3/uL (ref 0.0–0.5)
Eosinophils Relative: 2 %
HCT: 38.5 % — ABNORMAL LOW (ref 39.0–52.0)
Hemoglobin: 11.8 g/dL — ABNORMAL LOW (ref 13.0–17.0)
Immature Granulocytes: 0 %
Lymphocytes Relative: 8 %
Lymphs Abs: 1.4 10*3/uL (ref 0.7–4.0)
MCH: 19.6 pg — ABNORMAL LOW (ref 26.0–34.0)
MCHC: 30.6 g/dL (ref 30.0–36.0)
MCV: 63.8 fL — ABNORMAL LOW (ref 80.0–100.0)
Monocytes Absolute: 0.8 10*3/uL (ref 0.1–1.0)
Monocytes Relative: 4 %
Neutro Abs: 15.6 10*3/uL — ABNORMAL HIGH (ref 1.7–7.7)
Neutrophils Relative %: 86 %
Platelets: 377 10*3/uL (ref 150–400)
RBC: 6.03 MIL/uL — ABNORMAL HIGH (ref 4.22–5.81)
RDW: 26.2 % — ABNORMAL HIGH (ref 11.5–15.5)
Smear Review: NORMAL
WBC: 18.3 10*3/uL — ABNORMAL HIGH (ref 4.0–10.5)
nRBC: 0.2 % (ref 0.0–0.2)

## 2022-03-10 LAB — BASIC METABOLIC PANEL
Anion gap: 18 — ABNORMAL HIGH (ref 5–15)
BUN: 81 mg/dL — ABNORMAL HIGH (ref 6–20)
CO2: 19 mmol/L — ABNORMAL LOW (ref 22–32)
Calcium: 8 mg/dL — ABNORMAL LOW (ref 8.9–10.3)
Chloride: 99 mmol/L (ref 98–111)
Creatinine, Ser: 17.74 mg/dL — ABNORMAL HIGH (ref 0.61–1.24)
GFR, Estimated: 3 mL/min — ABNORMAL LOW (ref >60)
Glucose, Bld: 132 mg/dL — ABNORMAL HIGH (ref 70–99)
Potassium: 6.5 mmol/L (ref 3.5–5.1)
Sodium: 136 mmol/L (ref 135–145)

## 2022-03-10 LAB — HEPATITIS B SURFACE ANTIBODY,QUALITATIVE: Hep B S Ab: REACTIVE — AB

## 2022-03-10 LAB — MAGNESIUM: Magnesium: 2.3 mg/dL (ref 1.7–2.4)

## 2022-03-10 LAB — TROPONIN I (HIGH SENSITIVITY): Troponin I (High Sensitivity): 43 ng/L — ABNORMAL HIGH (ref <18)

## 2022-03-10 LAB — HEPATITIS B SURFACE ANTIGEN: Hepatitis B Surface Ag: NONREACTIVE

## 2022-03-10 MED ORDER — HEPARIN SODIUM (PORCINE) 1000 UNIT/ML DIALYSIS
1000.0000 [IU] | INTRAMUSCULAR | Status: DC | PRN
  Administered 2022-03-10: 1000 [IU]
  Filled 2022-03-10 (×2): qty 1

## 2022-03-10 MED ORDER — LIDOCAINE-PRILOCAINE 2.5-2.5 % EX CREA: 1.0000 "application " | TOPICAL_CREAM | CUTANEOUS | Status: DC | PRN

## 2022-03-10 MED ORDER — SODIUM ZIRCONIUM CYCLOSILICATE 10 G PO PACK: 10.0000 g | PACK | Freq: Every day | ORAL | Status: DC

## 2022-03-10 MED ORDER — SODIUM BICARBONATE 8.4 % IV SOLN: 50.0000 meq | Freq: Once | INTRAVENOUS | Status: DC

## 2022-03-10 MED ORDER — HEPARIN SODIUM (PORCINE) 1000 UNIT/ML DIALYSIS: 2000.0000 [IU] | INTRAMUSCULAR | Status: DC | PRN

## 2022-03-10 MED ORDER — CHLORHEXIDINE GLUCONATE CLOTH 2 % EX PADS: 6.0000 | MEDICATED_PAD | Freq: Every day | CUTANEOUS | Status: DC

## 2022-03-10 MED ORDER — PENTAFLUOROPROP-TETRAFLUOROETH EX AERO: 1.0000 "application " | INHALATION_SPRAY | CUTANEOUS | Status: DC | PRN

## 2022-03-10 MED ORDER — ALTEPLASE 2 MG IJ SOLR: 2.0000 mg | Freq: Once | INTRAMUSCULAR | Status: DC | PRN

## 2022-03-10 MED ORDER — LIDOCAINE HCL (PF) 1 % IJ SOLN: 5.0000 mL | INTRAMUSCULAR | Status: DC | PRN

## 2022-03-10 MED ORDER — ANTICOAGULANT SODIUM CITRATE 4% (200MG/5ML) IV SOLN: 5.0000 mL | Status: DC | PRN

## 2022-03-10 NOTE — ED Provider Notes (Signed)
  Physical Exam  BP (!) 171/124   Pulse 91   Temp 97.6 F (36.4 C) (Oral)   Resp (!) 23   Ht '5\' 5"'$  (1.651 m)   Wt 81.3 kg   SpO2 99%   BMI 29.83 kg/m     Procedures  Procedures  ED Course / MDM    Medical Decision Making Amount and/or Complexity of Data Reviewed Labs: ordered. Radiology: ordered.   The patient was reassessed postdialysis.  He is no longer as hypertensive previously.  He is not complaining of any complaints at this time.  No chest pain or shortness of breath.  Blood pressure 180/101, temperature 97.6, pulse 91, respiratory rate 20, O2 saturations 99% on room air.  The patient's lungs were clear to auscultation bilaterally.  He feels symptomatically improved post dialysis.  He has no complaints at this time.  Stable for discharge.       Regan Lemming, MD 03/10/22 1626

## 2022-03-10 NOTE — ED Triage Notes (Signed)
Patient arrived with EMS from home missed his hemodialysis treatment Monday this week , reports increasing SOB / chest tightness onset last night , CBG=161/hypertensive BP= 220/118 at home .

## 2022-03-10 NOTE — ED Notes (Signed)
Called pts mom per pt request, pts mom stated she is on the way to pick up pt, informed her he would be in the ED lobby waiting for her

## 2022-03-10 NOTE — ED Notes (Signed)
Attempted to call hemodialysis unit about when pt is scheduled today. Secretary advised she will have the charge nurse call me back when she gets back to the unit.

## 2022-03-10 NOTE — Procedures (Signed)
Pt seen on HD.  We were asked to see for ED HD due to SOB and pulm edema. 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 Presented to ED w/ SOB and pulm edema by CXR.  Pt seen in HD after 3 L fluid removed.  Pt doing quite well.  He had low BP episode toward the end of HD which was rx'd w/ NS bolus and turning off UF.  HD completed now. Will be transferred back to ED soon for reassessment prior to dc home.      I was present at this dialysis session, have reviewed the session itself and made  appropriate changes Kelly Splinter MD [place] pager [phone]   03/10/2022, 2:29 PM

## 2022-03-10 NOTE — ED Notes (Addendum)
No answer from dialysis RN, attempting to call again. Pt is next on the list.

## 2022-03-10 NOTE — Progress Notes (Signed)
Will be sent back to ER room 38

## 2022-03-10 NOTE — Progress Notes (Signed)
Received patient in bed, alert and oriented. Informed consent signed and in chart.  Time tx initiated:1005  Pre HD weight:unknown, patient unable to stand for weight  Pre HD VS:  Time tx completed:  HD treatment completed. Patient was aggitated at the beginning of the treatement, but tolerated well. HD catheter without signs and symptoms of complications. Patient transported back to the ER room 38, alert and orient and in no acute distress. Report given to bedside RN.  Total UF removed:3L  Medication given: None  Post HD VS:133/97, O2 92RA. P-74, R-25  Post HD weight: 117.2 kg

## 2022-03-10 NOTE — ED Notes (Signed)
ED TO INPATIENT HANDOFF REPORT    S Name/Age/Gender Randy Ross 51 y.o. male Room/Bed: RESUSC/RESUSC  Code Status: Full  Patient oriented x4  Triage Complete: Triage complete  Chief Complaint SOB: Dialysis Pt  Triage Note Patient arrived with EMS from home missed his hemodialysis treatment Monday this week , reports increasing SOB / chest tightness onset last night , CBG=161/hypertensive BP= 220/118 at home .    Allergies Allergies  Allergen Reactions   Gabapentin Shortness Of Breath and Rash    Scratchy throat    Metformin And Related Other (See Comments)    Affecting kidney function        B Medical/Surgery History Past Medical History:  Diagnosis Date   Anemia    CKD (chronic kidney disease)    on wed 12/09/21   Diabetes mellitus without complication ([place])    type 2   Hypertension    Past Surgical History:  Procedure Laterality Date   AV FISTULA PLACEMENT Left 12/10/2021   Procedure: LEFT RADIAL CEPHALIC  ARTERIOVENOUS (AV) FISTULA;  Surgeon: Waynetta Sandy, MD;  Location: [facility];  Service: Vascular;  Laterality: Left;   IR FLUORO GUIDE CV LINE RIGHT  12/09/2021   IR US GUIDE VASC ACCESS RIGHT  12/09/2021   LUMBAR [place]  2021   L5-S1     A IV Location/Drains/Wounds Patient Lines/Drains/Airways Status     Active Line/Drains/Airways     Name Placement date Placement time Site Days   Peripheral IV 03/10/22 18 G Right Antecubital 03/10/22  0543  Antecubital  less than 1   Fistula / Graft Left Other (Comment) Arteriovenous fistula 12/10/21  1048  Other (Comment)  90   Hemodialysis Catheter Right Internal jugular Double lumen Permanent (Tunneled) 12/09/21  1348  Internal jugular  91   Incision (Closed) 12/09/21 Neck Right 12/09/21  1352  -- 91   Incision (Closed) 12/10/21 Arm Left 12/10/21  1038  -- 90   Wound / Incision (Open or Dehisced) 12/09/21 Other (Comment) Chest Right;Upper Tunneled HD insertion site 12/09/21  1353  Chest  91             Intake/Output Last 24 hours No intake or output data in the 24 hours ending 03/10/22 0636  Labs/Imaging No results found for this or any previous visit (from the past 57 hour(s)). DG Chest Portable 1 View  Result Date: 03/10/2022 CLINICAL DATA:  51 year old male with history of shortness of breath and difficulty breathing. EXAM: PORTABLE CHEST 1 VIEW COMPARISON:  Chest x-ray 01/30/2022. FINDINGS: Right internal jugular PermCath with tip terminating in the right atrium. There is cephalization of the pulmonary vasculature, indistinctness of the interstitial markings, and patchy airspace disease throughout the lungs bilaterally suggestive of moderate pulmonary edema. No definite pleural effusions. No pneumothorax. Heart size is mildly enlarged. Upper mediastinal contours are within normal limits. Atherosclerotic calcifications in the thoracic aorta. IMPRESSION: 1. The appearance the chest is most suggestive of congestive heart failure, as above. 2. Aortic atherosclerosis. Electronically Signed   By: Vinnie Langton M.D.   On: 03/10/2022 06:01    Pending Labs Unresulted Labs (From admission, onward)     Start     Ordered   03/10/22 0626  Hepatitis B surface antigen  (New Admission Hemo Labs (Hepatitis B))  Once,   URGENT        03/10/22 0627   03/10/22 0626  Hepatitis B surface antibody  (New Admission Hemo Labs (Hepatitis B))  Once,   URGENT  03/10/22 0627   03/10/22 0626  Hepatitis B surface antibody,quantitative  (New Admission Hemo Labs (Hepatitis B))  Once,   URGENT        03/10/22 0627   03/10/22 1937  Basic metabolic panel  Once,   STAT        03/10/22 0545   03/10/22 0546  CBC with Differential  Once,   STAT        03/10/22 0545   03/10/22 0546  Magnesium  Once,   STAT        03/10/22 0545            Vitals/Pain Today's Vitals   03/10/22 0544 03/10/22 0600 03/10/22 0615 03/10/22 0619  BP:  (!) 198/110 (!) 192/99   Pulse:  93 89   Resp:  (!) 25 (!) 25    Temp:      TempSrc:      SpO2:  94% 96%   Weight:      Height:      PainSc: 0-No pain   0-No pain    Isolation Precautions No active isolations  Medications Medications  Chlorhexidine Gluconate Cloth 2 % PADS 6 each (has no administration in time range)  pentafluoroprop-tetrafluoroeth (GEBAUERS) aerosol 1 application. (has no administration in time range)  lidocaine (PF) (XYLOCAINE) 1 % injection 5 mL (has no administration in time range)  lidocaine-prilocaine (EMLA) cream 1 application. (has no administration in time range)  heparin injection 1,000 Units (has no administration in time range)  anticoagulant sodium citrate solution 5 mL (has no administration in time range)  alteplase (CATHFLO ACTIVASE) injection 2 mg (has no administration in time range)  heparin injection 2,000 Units (has no administration in time range)    Mobility ; Ambulatory  Low fall risk

## 2022-03-10 NOTE — ED Notes (Signed)
Waiting call from Hemodialysis unit.

## 2022-03-10 NOTE — ED Notes (Signed)
;  pt refuse labs

## 2022-03-10 NOTE — ED Provider Notes (Signed)
Emergency Department Provider Note   I have reviewed the triage vital signs and the nursing notes.   HISTORY  Chief Complaint Missed Hemodialysis / SOB   HPI Randy Ross is a 51 y.o. male with PMH of DM, HTN, ESRD on HD presents to the emergency department with chest tightness and shortness of breath after missing his dialysis session on Monday.  Symptoms have progressively worsened overnight.  He ultimately arrives by EMS hypertensive with new oxygen requirement.  Patient denies any fever or chills.  He states he missed Monday because it was a holiday and was not sure that he had dialysis that day.  He notes that this is very similar to how he feels when he has missed dialysis in the past although just started HD earlier this year.    Past Medical History:  Diagnosis Date   Anemia    CKD (chronic kidney disease)    on wed 12/09/21   Diabetes mellitus without complication ([place])    type 2   Hypertension     Review of Systems  Constitutional: No fever/chills Eyes: No visual changes. ENT: No sore throat. Cardiovascular: Positive chest pain. Respiratory: Positive shortness of breath. Gastrointestinal: No abdominal pain.  No nausea, no vomiting.  No diarrhea.  No constipation. Genitourinary: Negative for dysuria. Musculoskeletal: Negative for back pain. Skin: Negative for rash. Neurological: Negative for headaches, focal weakness or numbness.  ____________________________________________   PHYSICAL EXAM:  VITAL SIGNS: ED Triage Vitals  Enc Vitals Group     BP 03/10/22 0537 (!) 215/116     Pulse Rate 03/10/22 0537 94     Resp 03/10/22 0537 16     Temp 03/10/22 0537 98.3 F (36.8 C)     Temp Source 03/10/22 0537 Oral     SpO2 03/10/22 0537 94 %     Weight 03/10/22 0538 202 lb 13.2 oz (92 kg)     Height 03/10/22 0538 '5\' 5"'$  (1.651 m)   Constitutional: Alert and oriented. Well appearing and in no acute distress. Eyes: Conjunctivae are normal.  Head:  Atraumatic. Nose: No congestion/rhinnorhea. Mouth/Throat: Mucous membranes are moist.   Neck: No stridor. Cardiovascular: Normal rate, regular rhythm. Good peripheral circulation. Grossly normal heart sounds.   Respiratory: Slight increased respiratory effort.  No retractions. Lungs with crackles at the bases bilaterally. Gastrointestinal: Soft and nontender. No distention.  Musculoskeletal: No lower extremity tenderness nor edema. No gross deformities of extremities. Neurologic:  Normal speech and language.  Skin:  Skin is warm, dry and intact. No rash noted.   ____________________________________________   LABS (all labs ordered are listed, but only abnormal results are displayed)  Labs Reviewed  BASIC METABOLIC PANEL - Abnormal; Notable for the following components:      Result Value   Potassium 6.5 (*)    CO2 19 (*)    Glucose, Bld 132 (*)    BUN 81 (*)    Creatinine, Ser 17.74 (*)    Calcium 8.0 (*)    GFR, Estimated 3 (*)    Anion gap 18 (*)    All other components within normal limits  CBC WITH DIFFERENTIAL/PLATELET - Abnormal; Notable for the following components:   WBC 18.3 (*)    RBC 6.03 (*)    Hemoglobin 11.8 (*)    HCT 38.5 (*)    MCV 63.8 (*)    MCH 19.6 (*)    RDW 26.2 (*)    Neutro Abs 15.6 (*)    All other components within  normal limits  HEPATITIS B SURFACE ANTIBODY,QUALITATIVE - Abnormal; Notable for the following components:   Hep B S Ab Reactive (*)    All other components within normal limits  TROPONIN I (HIGH SENSITIVITY) - Abnormal; Notable for the following components:   Troponin I (High Sensitivity) 43 (*)    All other components within normal limits  MAGNESIUM  HEPATITIS B SURFACE ANTIGEN  HEPATITIS B SURFACE ANTIBODY, QUANTITATIVE  TROPONIN I (HIGH SENSITIVITY)   ____________________________________________  EKG   EKG Interpretation  Date/Time:  Wednesday Mar 10 2022 05:34:51 EDT Ventricular Rate:  96 PR Interval:  179 QRS  Duration: 94 QT Interval:  401 QTC Calculation: 507 R Axis:   106 Text Interpretation: Sinus rhythm Right axis deviation Prolonged QT interval Confirmed by Nanda Quinton [phone]) on 03/10/2022 5:37:48 AM        ____________________________________________  RADIOLOGY  DG Chest Portable 1 View  Result Date: 03/10/2022 CLINICAL DATA:  51 year old male with history of shortness of breath and difficulty breathing. EXAM: PORTABLE CHEST 1 VIEW COMPARISON:  Chest x-ray 01/30/2022. FINDINGS: Right internal jugular PermCath with tip terminating in the right atrium. There is cephalization of the pulmonary vasculature, indistinctness of the interstitial markings, and patchy airspace disease throughout the lungs bilaterally suggestive of moderate pulmonary edema. No definite pleural effusions. No pneumothorax. Heart size is mildly enlarged. Upper mediastinal contours are within normal limits. Atherosclerotic calcifications in the thoracic aorta. IMPRESSION: 1. The appearance the chest is most suggestive of congestive heart failure, as above. 2. Aortic atherosclerosis. Electronically Signed   By: Vinnie Langton M.D.   On: 03/10/2022 06:01    ____________________________________________   PROCEDURES  Procedure(s) performed:   .Critical Care Performed by: Margette Fast, MD Authorized by: Margette Fast, MD   Critical care provider statement:    Critical care time (minutes):  35   Critical care time was exclusive of:  Separately billable procedures and treating other patients and teaching time   Critical care was necessary to treat or prevent imminent or life-threatening deterioration of the following conditions:  Respiratory failure and renal failure   Critical care was time spent personally by me on the following activities:  Blood draw for specimens, development of treatment plan with patient or surrogate, discussions with consultants, evaluation of patient's response to treatment, obtaining  history from patient or surrogate, ordering and performing treatments and interventions, ordering and review of laboratory studies, ordering and review of radiographic studies, pulse oximetry, re-evaluation of patient's condition and review of old charts   I assumed direction of critical care for this patient from another provider in my specialty: no     Care discussed with: admitting provider     ____________________________________________   INITIAL IMPRESSION / [place] / ED COURSE  Pertinent labs & imaging results that were available during my care of the patient were reviewed by me and considered in my medical decision making (see chart for details).   This patient is Presenting for Evaluation of CP, which does require a range of treatment options, and is a complaint that involves a high risk of morbidity and mortality.  The Differential Diagnoses includes but is not exclusive to acute coronary syndrome, aortic dissection, pulmonary embolism, cardiac tamponade, community-acquired pneumonia, pericarditis, musculoskeletal chest wall pain, etc.   I did obtain Additional Historical Information from EMS.  I decided to review pertinent External Data, and in summary patient with admit in March to start HD. Seen in the ED once prior with symptoms  related to missing HD.   Clinical Laboratory Tests Ordered, included potassium of 6.5.  Creatinine and BUN elevated as expected in end-stage renal disease.  Magnesium is normal.  Radiologic Tests Ordered, included CXR. I independently interpreted the images and agree with radiology interpretation.   Cardiac Monitor Tracing which shows NSR.   Social Determinants of Health Risk patient does have a smoking history.   Consult complete with Nephrology, Dr. Augustin Coupe at [facility] AM. He will arrange HD from the ED.   Medical Decision Making: Summary:  Presents to the emergency department with shortness of breath and chest tightness's.  Seems most  consistent with missing his hemodialysis session on Monday.  He arrives hypertensive with new oxygen requirement although no severe distress.  Not requiring BiPAP or intubation.  Likely will need hemodialysis.  EKG shows some peaked T waves which seem new from prior and so we will need to follow his electrolytes and discussed with nephrology. Patient comfortable on 4L NCO2.   Reevaluation with update and discussion with patient. Plan for HD and reassess. He is in agreement with plan.   Considered admission but seems reasonable to arrange urgent HD from the ED and reassess symptoms after HD to decide if further evaluation is necessary.   Disposition: pending   ____________________________________________  FINAL CLINICAL IMPRESSION(S) / ED DIAGNOSES  Final diagnoses:  Shortness of breath  Hemodialysis patient [facility])    Note:  This document was prepared using Dragon voice recognition software and may include unintentional dictation errors.  Nanda Quinton, MD, [facility] Emergency Medicine    Jaaziel Peatross, [facility], MD 03/11/22 [phone]

## 2022-03-11 LAB — HEPATITIS B SURFACE ANTIBODY, QUANTITATIVE: Hep B S AB Quant (Post): 83.6 m[IU]/mL (ref >9.9)

## 2022-03-31 ENCOUNTER — Encounter (HOSPITAL_COMMUNITY): Payer: Self-pay

## 2022-03-31 ENCOUNTER — Emergency Department (HOSPITAL_COMMUNITY)
Admission: EM | Admit: 2022-03-31 | Discharge: 2022-03-31 | Disposition: A | Discharge status: Home / Self Care | Payer: Self-pay | Attending: Emergency Medicine | Admitting: Emergency Medicine

## 2022-03-31 ENCOUNTER — Emergency Department (HOSPITAL_COMMUNITY): Payer: Self-pay

## 2022-03-31 ENCOUNTER — Other Ambulatory Visit: Payer: Self-pay

## 2022-03-31 DIAGNOSIS — I120 Hypertensive chronic kidney disease with stage 5 chronic kidney disease or end stage renal disease: Secondary | ICD-10-CM | POA: Diagnosis not present

## 2022-03-31 DIAGNOSIS — Z7984 Long term (current) use of oral hypoglycemic drugs: Secondary | ICD-10-CM | POA: Diagnosis not present

## 2022-03-31 DIAGNOSIS — R55 Syncope and collapse: Secondary | ICD-10-CM | POA: Insufficient documentation

## 2022-03-31 DIAGNOSIS — Z79899 Other long term (current) drug therapy: Secondary | ICD-10-CM | POA: Insufficient documentation

## 2022-03-31 DIAGNOSIS — N186 End stage renal disease: Secondary | ICD-10-CM | POA: Insufficient documentation

## 2022-03-31 DIAGNOSIS — E1122 Type 2 diabetes mellitus with diabetic chronic kidney disease: Secondary | ICD-10-CM | POA: Insufficient documentation

## 2022-03-31 DIAGNOSIS — Z992 Dependence on renal dialysis: Secondary | ICD-10-CM | POA: Diagnosis not present

## 2022-03-31 DIAGNOSIS — R42 Dizziness and giddiness: Secondary | ICD-10-CM | POA: Insufficient documentation

## 2022-03-31 LAB — CBC WITH DIFFERENTIAL/PLATELET
Abs Immature Granulocytes: 0.02 10*3/uL (ref 0.00–0.07)
Basophils Absolute: 0.1 10*3/uL (ref 0.0–0.1)
Basophils Relative: 1 %
Eosinophils Absolute: 0.2 10*3/uL (ref 0.0–0.5)
Eosinophils Relative: 2 %
HCT: 35.8 % — ABNORMAL LOW (ref 39.0–52.0)
Hemoglobin: 11 g/dL — ABNORMAL LOW (ref 13.0–17.0)
Immature Granulocytes: 0 %
Lymphocytes Relative: 10 %
Lymphs Abs: 0.9 10*3/uL (ref 0.7–4.0)
MCH: 19.4 pg — ABNORMAL LOW (ref 26.0–34.0)
MCHC: 30.7 g/dL (ref 30.0–36.0)
MCV: 63 fL — ABNORMAL LOW (ref 80.0–100.0)
Monocytes Absolute: 0.7 10*3/uL (ref 0.1–1.0)
Monocytes Relative: 7 %
Neutro Abs: 7.5 10*3/uL (ref 1.7–7.7)
Neutrophils Relative %: 80 %
Platelets: 317 10*3/uL (ref 150–400)
RBC: 5.68 MIL/uL (ref 4.22–5.81)
RDW: 25.2 % — ABNORMAL HIGH (ref 11.5–15.5)
WBC: 9.3 10*3/uL (ref 4.0–10.5)
nRBC: 0 % (ref 0.0–0.2)

## 2022-03-31 LAB — COMPREHENSIVE METABOLIC PANEL
ALT: 31 U/L (ref 0–44)
AST: 21 U/L (ref 15–41)
Albumin: 3.8 g/dL (ref 3.5–5.0)
Alkaline Phosphatase: 77 U/L (ref 38–126)
Anion gap: 9 (ref 5–15)
BUN: 22 mg/dL — ABNORMAL HIGH (ref 6–20)
CO2: 33 mmol/L — ABNORMAL HIGH (ref 22–32)
Calcium: 8.6 mg/dL — ABNORMAL LOW (ref 8.9–10.3)
Chloride: 95 mmol/L — ABNORMAL LOW (ref 98–111)
Creatinine, Ser: 6.79 mg/dL — ABNORMAL HIGH (ref 0.61–1.24)
GFR, Estimated: 9 mL/min — ABNORMAL LOW (ref >60)
Glucose, Bld: 215 mg/dL — ABNORMAL HIGH (ref 70–99)
Potassium: 4.1 mmol/L (ref 3.5–5.1)
Sodium: 137 mmol/L (ref 135–145)
Total Bilirubin: 0.4 mg/dL (ref 0.3–1.2)
Total Protein: 7.8 g/dL (ref 6.5–8.1)

## 2022-03-31 NOTE — ED Triage Notes (Signed)
Pt BIB EMS from home. Pt came home from dialysis today, pt states he normally has 2L taken off him but today 3L was taken off. Pt c/o dizziness. Pt states he had a similar episode Monday when 3.5L was taken off.   CBG 215 160/100 98% RA 68 HR

## 2022-03-31 NOTE — Discharge Instructions (Addendum)
Be sure to talk to your dialysis team about how much fluid they are taking off and whether they are taking off too much to cause you to nearly pass out.  If you develop recurrent symptoms, chest pain, shortness of breath, or any other new/concerning symptoms return to the ER or call 911

## 2022-03-31 NOTE — ED Provider Notes (Signed)
[place] DEPT Provider Note   CSN: [id number] Arrival date & time: 03/31/22  1606     History  Chief Complaint  Patient presents with   Dizziness   Dialysis Patient    Randy Ross is a 51 y.o. male.  HPI 51 year old male presents with dizziness and near syncope.  History is from EMS as well as patient.  Patient went to dialysis 2 days ago on Monday and due to his weight he had 3.5 kg taken off.  This is more than his normal 2.2.  That evening he was feeling poorly and lightheaded and tried to go to the bathroom with assistance of family but passed out.  He told dialysis this today and they took off 3.0 instead.  At home he again started to feel lightheaded and so EMS was called.  He never passed out this time.  On the way here he felt some abdominal cramping for about 10 minutes but that went away.  He now feels well.  Besides these 2 episodes he has been feeling fine and denies fevers, chest pain, shortness of breath or recent illness.  He never had chest pain during these episodes  Besides ESRD he also has diabetes and hypertension and anemia.  Home Medications Prior to Admission medications   Medication Sig Start Date End Date Taking? Authorizing Provider  acetaminophen (TYLENOL) 325 MG tablet Take 650 mg by mouth every 6 (six) hours as needed for moderate pain.   Yes [provider]  acetaminophen (TYLENOL) 500 MG tablet Take 1 tablet (500 mg total) by mouth every 6 (six) hours as needed for mild pain. Patient taking differently: Take 1,000 mg by mouth every 6 (six) hours as needed for mild pain or headache. 12/15/21  Yes Elgergawy, Silver Huguenin, MD  calcitRIOL (ROCALTROL) 0.25 MCG capsule Take 1 capsule (0.25 mcg total) by mouth daily. 12/16/21  Yes Elgergawy, Silver Huguenin, MD  calcium acetate (PHOSLO) 667 MG capsule Take 2 capsules (1,334 mg total) by mouth 3 (three) times daily with meals. 12/15/21  Yes Elgergawy, Silver Huguenin, MD  carvedilol  (COREG) 12.5 MG tablet Take 12.5 mg by mouth daily.   Yes [provider]  Emollient (CETAPHIL) cream Apply 1 application  topically daily as needed (itching).   Yes [provider]  isosorbide mononitrate (IMDUR) 30 MG 24 hr tablet Take 1 tablet (30 mg total) by mouth daily. 12/16/21  Yes Elgergawy, Silver Huguenin, MD  losartan (COZAAR) 100 MG tablet Take 100 mg by mouth daily. 03/15/22  Yes [provider]  multivitamin (RENA-VIT) TABS tablet Take 1 tablet by mouth at bedtime. 12/15/21  Yes Elgergawy, Silver Huguenin, MD  SEMGLEE, YFGN, 100 UNIT/ML Pen Inject 10 Units into the skin daily. 01/20/22  Yes [provider]  tetrahydrozoline-zinc (VISINE-AC) 0.05-0.25 % ophthalmic solution Place 1 drop into both eyes 3 (three) times daily as needed (dry eyes).   Yes [provider]  tiZANidine (ZANAFLEX) 4 MG tablet Take 4 mg by mouth 3 (three) times daily as needed for muscle spasms.   Yes [provider]  amLODipine (NORVASC) 10 MG tablet Take 1 tablet (10 mg total) by mouth at bedtime. Patient not taking: Reported on 03/31/2022 12/15/21   Elgergawy, Silver Huguenin, MD  carvedilol (COREG) 6.25 MG tablet Take 1 tablet (6.25 mg total) by mouth 2 (two) times daily with a meal. Patient not taking: Reported on 03/31/2022 12/15/21   Elgergawy, Silver Huguenin, MD  hydrALAZINE (APRESOLINE) 50 MG tablet Take  1 tablet (50 mg total) by mouth 3 (three) times daily. Patient not taking: Reported on 03/31/2022 12/15/21   Elgergawy, Silver Huguenin, MD  Insulin Pen Needle 32G X 4 MM MISC Use to inject insulin daily as directed. 12/15/21   Elgergawy, Silver Huguenin, MD      Allergies    Gabapentin and Metformin and related    Review of Systems   Review of Systems  Constitutional:  Negative for fever.  Cardiovascular:  Negative for chest pain.  Gastrointestinal:  Positive for abdominal pain and nausea. Negative for vomiting.  Neurological:  Positive for syncope and light-headedness.    Physical Exam Updated  Vital Signs BP (!) 159/92   Pulse 70   Temp 97.7 F (36.5 C) (Oral)   Resp 20   SpO2 98%  Physical Exam Vitals and nursing note reviewed.  Constitutional:      Appearance: He is well-developed.  HENT:     Head: Normocephalic and atraumatic.  Eyes:     Extraocular Movements: Extraocular movements intact.     Pupils: Pupils are equal, round, and reactive to light.  Cardiovascular:     Rate and Rhythm: Normal rate and regular rhythm.     Heart sounds: Normal heart sounds.  Pulmonary:     Effort: Pulmonary effort is normal.     Breath sounds: Normal breath sounds.  Abdominal:     General: There is no distension.     Palpations: Abdomen is soft.     Tenderness: There is no abdominal tenderness.  Skin:    General: Skin is warm and dry.  Neurological:     Mental Status: He is alert.     Comments: CN 3-12 grossly intact. 5/5 strength in all 4 extremities. Grossly normal sensation. Normal finger to nose.      ED Results / Procedures / Treatments   Labs (all labs ordered are listed, but only abnormal results are displayed) Labs Reviewed  CBC WITH DIFFERENTIAL/PLATELET - Abnormal; Notable for the following components:      Result Value   Hemoglobin 11.0 (*)    HCT 35.8 (*)    MCV 63.0 (*)    MCH 19.4 (*)    RDW 25.2 (*)    All other components within normal limits  COMPREHENSIVE METABOLIC PANEL - Abnormal; Notable for the following components:   Chloride 95 (*)    CO2 33 (*)    Glucose, Bld 215 (*)    BUN 22 (*)    Creatinine, Ser 6.79 (*)    Calcium 8.6 (*)    GFR, Estimated 9 (*)    All other components within normal limits    EKG EKG Interpretation  Date/Time:  Wednesday March 31 2022 16:30:09 EDT Ventricular Rate:  68 PR Interval:  160 QRS Duration: 92 QT Interval:  515 QTC Calculation: 548 R Axis:   27 Text Interpretation: Sinus rhythm Probable left atrial enlargement  diffuse ST changes similar to may 2023 Prolonged QT interval Confirmed by Sherwood Gambler  [phone]) on 03/31/2022 4:33:36 PM  Radiology DG Chest 2 View  Result Date: 03/31/2022 CLINICAL DATA:  Dialysis patient.  Near syncope EXAM: CHEST - 2 VIEW COMPARISON:  Chest 03/10/2022 FINDINGS: Interval resolution of pulmonary edema. Lungs now clear without infiltrate or effusion. Dialysis catheter in the right atrium unchanged. Cardiac enlargement. IMPRESSION: No acute abnormality.  Interval resolution of pulmonary edema. Electronically Signed   By: Franchot Gallo M.D.   On: 03/31/2022 17:27    Procedures Procedures  Medications Ordered in ED Medications - No data to display  ED Course/ Medical Decision Making/ A&P                           Medical Decision Making Amount and/or Complexity of Data Reviewed Independent Historian: EMS External Data Reviewed: notes.    Details: Recent discharge summary from hospital Labs: ordered.    Details: Hemoglobin of 11 is stable from baseline, CKD which is expected no significant electrolyte disturbance Radiology: ordered and independent interpretation performed.    Details: No pulmonary edema ECG/medicine tests: ordered and independent interpretation performed.    Details: Nonspecific changes, similar to priors.   Symptoms sound like patient has had volume loss from his dialysis that is too much and causing him to pass out and get lightheaded.  No obvious warning signs of arrhythmia on his ECG.  Exam is overall unremarkable.  He feels fine ever since arriving to the ER and has stayed this way on multiple hours.  Neuro exam unremarkable.  Doubt ACS given no chest pain or shortness of breath.  At this point, discussed he needs to follow-up closely with his nephrology team to discuss fluid management.        Final Clinical Impression(s) / ED Diagnoses Final diagnoses:  Near syncope    Rx / DC Orders ED Discharge Orders     None         Sherwood Gambler, MD 03/31/22 2320

## 2022-03-31 NOTE — ED Notes (Signed)
An After Visit Summary was printed and given to the patient. Discharge instructions given and no further questions at this time.  Pt leaving with family.  

## 2022-03-31 NOTE — ED Notes (Signed)
Orthostatic were taken on forearm due to IV and restricted limb.

## 2022-04-19 ENCOUNTER — Emergency Department (HOSPITAL_COMMUNITY)
Admission: EM | Admit: 2022-04-19 | Discharge: 2022-04-19 | Disposition: A | Discharge status: Home / Self Care | Payer: Commercial Managed Care - HMO | Attending: Emergency Medicine | Admitting: Emergency Medicine

## 2022-04-19 ENCOUNTER — Other Ambulatory Visit: Payer: Self-pay

## 2022-04-19 ENCOUNTER — Encounter (HOSPITAL_COMMUNITY): Payer: Self-pay | Admitting: Emergency Medicine

## 2022-04-19 ENCOUNTER — Emergency Department (HOSPITAL_COMMUNITY): Payer: Commercial Managed Care - HMO

## 2022-04-19 DIAGNOSIS — E875 Hyperkalemia: Secondary | ICD-10-CM | POA: Insufficient documentation

## 2022-04-19 DIAGNOSIS — R0602 Shortness of breath: Secondary | ICD-10-CM | POA: Diagnosis present

## 2022-04-19 DIAGNOSIS — Z992 Dependence on renal dialysis: Secondary | ICD-10-CM | POA: Insufficient documentation

## 2022-04-19 DIAGNOSIS — E8770 Fluid overload, unspecified: Secondary | ICD-10-CM | POA: Diagnosis not present

## 2022-04-19 DIAGNOSIS — Z794 Long term (current) use of insulin: Secondary | ICD-10-CM | POA: Diagnosis not present

## 2022-04-19 DIAGNOSIS — R0902 Hypoxemia: Secondary | ICD-10-CM | POA: Insufficient documentation

## 2022-04-19 DIAGNOSIS — Z79899 Other long term (current) drug therapy: Secondary | ICD-10-CM | POA: Insufficient documentation

## 2022-04-19 LAB — CBC WITH DIFFERENTIAL/PLATELET
Abs Immature Granulocytes: 0.05 10*3/uL (ref 0.00–0.07)
Basophils Absolute: 0.1 10*3/uL (ref 0.0–0.1)
Basophils Relative: 1 %
Eosinophils Absolute: 0.5 10*3/uL (ref 0.0–0.5)
Eosinophils Relative: 3 %
HCT: 30.4 % — ABNORMAL LOW (ref 39.0–52.0)
Hemoglobin: 9.9 g/dL — ABNORMAL LOW (ref 13.0–17.0)
Immature Granulocytes: 0 %
Lymphocytes Relative: 8 %
Lymphs Abs: 1.2 10*3/uL (ref 0.7–4.0)
MCH: 19.9 pg — ABNORMAL LOW (ref 26.0–34.0)
MCHC: 32.6 g/dL (ref 30.0–36.0)
MCV: 61 fL — ABNORMAL LOW (ref 80.0–100.0)
Monocytes Absolute: 0.8 10*3/uL (ref 0.1–1.0)
Monocytes Relative: 5 %
Neutro Abs: 12.4 10*3/uL — ABNORMAL HIGH (ref 1.7–7.7)
Neutrophils Relative %: 83 %
Platelets: 311 10*3/uL (ref 150–400)
RBC: 4.98 MIL/uL (ref 4.22–5.81)
RDW: 23.6 % — ABNORMAL HIGH (ref 11.5–15.5)
WBC: 15 10*3/uL — ABNORMAL HIGH (ref 4.0–10.5)
nRBC: 0 % (ref 0.0–0.2)

## 2022-04-19 LAB — COMPREHENSIVE METABOLIC PANEL
ALT: 20 U/L (ref 0–44)
AST: 12 U/L — ABNORMAL LOW (ref 15–41)
Albumin: 3.4 g/dL — ABNORMAL LOW (ref 3.5–5.0)
Alkaline Phosphatase: 67 U/L (ref 38–126)
Anion gap: 18 — ABNORMAL HIGH (ref 5–15)
BUN: 68 mg/dL — ABNORMAL HIGH (ref 6–20)
CO2: 21 mmol/L — ABNORMAL LOW (ref 22–32)
Calcium: 8.4 mg/dL — ABNORMAL LOW (ref 8.9–10.3)
Chloride: 97 mmol/L — ABNORMAL LOW (ref 98–111)
Creatinine, Ser: 12.65 mg/dL — ABNORMAL HIGH (ref 0.61–1.24)
GFR, Estimated: 4 mL/min — ABNORMAL LOW (ref >60)
Glucose, Bld: 109 mg/dL — ABNORMAL HIGH (ref 70–99)
Potassium: 5.4 mmol/L — ABNORMAL HIGH (ref 3.5–5.1)
Sodium: 136 mmol/L (ref 135–145)
Total Bilirubin: 0.4 mg/dL (ref 0.3–1.2)
Total Protein: 7 g/dL (ref 6.5–8.1)

## 2022-04-19 LAB — RENAL FUNCTION PANEL
Albumin: 3 g/dL — ABNORMAL LOW (ref 3.5–5.0)
Anion gap: 15 (ref 5–15)
BUN: 68 mg/dL — ABNORMAL HIGH (ref 6–20)
CO2: 22 mmol/L (ref 22–32)
Calcium: 8.1 mg/dL — ABNORMAL LOW (ref 8.9–10.3)
Chloride: 99 mmol/L (ref 98–111)
Creatinine, Ser: 12.34 mg/dL — ABNORMAL HIGH (ref 0.61–1.24)
GFR, Estimated: 4 mL/min — ABNORMAL LOW (ref >60)
Glucose, Bld: 104 mg/dL — ABNORMAL HIGH (ref 70–99)
Phosphorus: 7.8 mg/dL — ABNORMAL HIGH (ref 2.5–4.6)
Potassium: 5 mmol/L (ref 3.5–5.1)
Sodium: 136 mmol/L (ref 135–145)

## 2022-04-19 LAB — HEPATITIS B SURFACE ANTIGEN: Hepatitis B Surface Ag: NONREACTIVE

## 2022-04-19 LAB — HEPATITIS B SURFACE ANTIBODY,QUALITATIVE: Hep B S Ab: REACTIVE — AB

## 2022-04-19 LAB — HEPATITIS B CORE ANTIBODY, TOTAL: Hep B Core Total Ab: REACTIVE — AB

## 2022-04-19 LAB — LIPASE, BLOOD: Lipase: 80 U/L — ABNORMAL HIGH (ref 11–51)

## 2022-04-19 LAB — HEPATITIS C ANTIBODY: HCV Ab: NONREACTIVE

## 2022-04-19 MED ORDER — LOSARTAN POTASSIUM 50 MG PO TABS
100.0000 mg | ORAL_TABLET | Freq: Every day | ORAL | Status: DC
  Filled 2022-04-19: qty 2

## 2022-04-19 MED ORDER — ISOSORBIDE MONONITRATE ER 30 MG PO TB24
30.0000 mg | ORAL_TABLET | Freq: Every day | ORAL | Status: DC
  Administered 2022-04-19: 30 mg via ORAL
  Filled 2022-04-19: qty 1

## 2022-04-19 MED ORDER — HYDRALAZINE HCL 25 MG PO TABS
50.0000 mg | ORAL_TABLET | Freq: Three times a day (TID) | ORAL | Status: DC
  Administered 2022-04-19: 50 mg via ORAL
  Filled 2022-04-19: qty 2

## 2022-04-19 MED ORDER — CHLORHEXIDINE GLUCONATE CLOTH 2 % EX PADS: 6.0000 | MEDICATED_PAD | Freq: Every day | CUTANEOUS | Status: DC

## 2022-04-19 MED ORDER — LIDOCAINE-PRILOCAINE 2.5-2.5 % EX CREA
1.0000 | TOPICAL_CREAM | CUTANEOUS | Status: DC | PRN
  Filled 2022-04-19: qty 5

## 2022-04-19 MED ORDER — PENTAFLUOROPROP-TETRAFLUOROETH EX AERO
1.0000 | INHALATION_SPRAY | CUTANEOUS | Status: DC | PRN
  Filled 2022-04-19: qty 30

## 2022-04-19 MED ORDER — HEPARIN SODIUM (PORCINE) 1000 UNIT/ML IJ SOLN
INTRAMUSCULAR | Status: AC
  Administered 2022-04-19: 3000 [IU]
  Filled 2022-04-19: qty 3

## 2022-04-19 MED ORDER — AMLODIPINE BESYLATE 5 MG PO TABS
10.0000 mg | ORAL_TABLET | Freq: Every day | ORAL | Status: DC
  Administered 2022-04-19: 10 mg via ORAL
  Filled 2022-04-19: qty 2

## 2022-04-19 MED ORDER — ACETAMINOPHEN 325 MG PO TABS
ORAL_TABLET | ORAL | Status: AC
  Filled 2022-04-19: qty 3

## 2022-04-19 MED ORDER — ACETAMINOPHEN 500 MG PO TABS
1000.0000 mg | ORAL_TABLET | Freq: Four times a day (QID) | ORAL | Status: DC | PRN
  Administered 2022-04-19: 975 mg via ORAL

## 2022-04-19 NOTE — ED Provider Notes (Signed)
[facility] EMERGENCY DEPARTMENT Provider Note   CSN: [id number] Arrival date & time: 04/19/22  0341     History  Chief Complaint  Patient presents with   Shortness of Breath    Randy Ross is a 51 y.o. male.  M/W/F dialysis patient. Last dialyzed Friday with a full session. Went to bed last night and woke up this AM with significant dyspnea that reminds him of when he misses dialysis. Hypoxic with EMS, started on oxygen. No fever. No cough.    Shortness of Breath      Home Medications Prior to Admission medications   Medication Sig Start Date End Date Taking? Authorizing Provider  acetaminophen (TYLENOL) 325 MG tablet Take 650 mg by mouth every 6 (six) hours as needed for moderate pain.    [provider]  acetaminophen (TYLENOL) 500 MG tablet Take 1 tablet (500 mg total) by mouth every 6 (six) hours as needed for mild pain. Patient taking differently: Take 1,000 mg by mouth every 6 (six) hours as needed for mild pain or headache. 12/15/21   Elgergawy, Silver Huguenin, MD  amLODipine (NORVASC) 10 MG tablet Take 1 tablet (10 mg total) by mouth at bedtime. Patient not taking: Reported on 03/31/2022 12/15/21   Elgergawy, Silver Huguenin, MD  calcitRIOL (ROCALTROL) 0.25 MCG capsule Take 1 capsule (0.25 mcg total) by mouth daily. 12/16/21   Elgergawy, Silver Huguenin, MD  calcium acetate (PHOSLO) 667 MG capsule Take 2 capsules (1,334 mg total) by mouth 3 (three) times daily with meals. 12/15/21   Elgergawy, Silver Huguenin, MD  carvedilol (COREG) 12.5 MG tablet Take 12.5 mg by mouth daily.    [provider]  carvedilol (COREG) 6.25 MG tablet Take 1 tablet (6.25 mg total) by mouth 2 (two) times daily with a meal. Patient not taking: Reported on 03/31/2022 12/15/21   Elgergawy, Silver Huguenin, MD  Emollient (CETAPHIL) cream Apply 1 application  topically daily as needed (itching).    [provider]  hydrALAZINE (APRESOLINE) 50 MG tablet Take 1 tablet (50 mg total) by mouth 3  (three) times daily. Patient not taking: Reported on 03/31/2022 12/15/21   Elgergawy, Silver Huguenin, MD  Insulin Pen Needle 32G X 4 MM MISC Use to inject insulin daily as directed. 12/15/21   Elgergawy, Silver Huguenin, MD  isosorbide mononitrate (IMDUR) 30 MG 24 hr tablet Take 1 tablet (30 mg total) by mouth daily. 12/16/21   Elgergawy, Silver Huguenin, MD  losartan (COZAAR) 100 MG tablet Take 100 mg by mouth daily. 03/15/22   [provider]  multivitamin (RENA-VIT) TABS tablet Take 1 tablet by mouth at bedtime. 12/15/21   Elgergawy, Silver Huguenin, MD  SEMGLEE, YFGN, 100 UNIT/ML Pen Inject 10 Units into the skin daily. 01/20/22   [provider]  tetrahydrozoline-zinc (VISINE-AC) 0.05-0.25 % ophthalmic solution Place 1 drop into both eyes 3 (three) times daily as needed (dry eyes).    [provider]  tiZANidine (ZANAFLEX) 4 MG tablet Take 4 mg by mouth 3 (three) times daily as needed for muscle spasms.    [provider]      Allergies    Gabapentin and Metformin and related    Review of Systems   Review of Systems  Respiratory:  Positive for shortness of breath.     Physical Exam Updated Vital Signs BP (!) 190/106   Pulse 95   Temp 98 F (36.7 C) (Oral)   Resp (!) 36   SpO2 93%  Physical Exam Vitals  and nursing note reviewed.  Constitutional:      Appearance: He is well-developed.  HENT:     Head: Normocephalic and atraumatic.  Cardiovascular:     Rate and Rhythm: Normal rate.  Pulmonary:     Effort: Pulmonary effort is normal. No respiratory distress.     Breath sounds: Examination of the right-lower field reveals rales. Examination of the left-lower field reveals rales. Rales present.  Chest:     Chest wall: No mass.  Abdominal:     General: There is no distension.  Musculoskeletal:        General: Normal range of motion.     Cervical back: Normal range of motion.  Skin:    General: Skin is warm and dry.  Neurological:     Mental Status: He is alert.     ED  Results / Procedures / Treatments   Labs (all labs ordered are listed, but only abnormal results are displayed) Labs Reviewed  CBC WITH DIFFERENTIAL/PLATELET - Abnormal; Notable for the following components:      Result Value   WBC 15.0 (*)    Hemoglobin 9.9 (*)    HCT 30.4 (*)    MCV 61.0 (*)    MCH 19.9 (*)    RDW 23.6 (*)    Neutro Abs 12.4 (*)    All other components within normal limits  COMPREHENSIVE METABOLIC PANEL - Abnormal; Notable for the following components:   Potassium 5.4 (*)    Chloride 97 (*)    CO2 21 (*)    Glucose, Bld 109 (*)    BUN 68 (*)    Creatinine, Ser 12.65 (*)    Calcium 8.4 (*)    Albumin 3.4 (*)    AST 12 (*)    GFR, Estimated 4 (*)    Anion gap 18 (*)    All other components within normal limits  LIPASE, BLOOD - Abnormal; Notable for the following components:   Lipase 80 (*)    All other components within normal limits  HEPATITIS B SURFACE ANTIGEN  HEPATITIS B SURFACE ANTIBODY,QUALITATIVE  HEPATITIS B SURFACE ANTIBODY, QUANTITATIVE  HEPATITIS B CORE ANTIBODY, TOTAL  HEPATITIS C ANTIBODY    EKG EKG Interpretation  Date/Time:  Monday April 19 2022 03:52:15 EDT Ventricular Rate:  91 PR Interval:  156 QRS Duration: 90 QT Interval:  404 QTC Calculation: 496 R Axis:   80 Text Interpretation: Normal sinus rhythm ST & T wave abnormality, consider lateral ischemia Prolonged QT Abnormal ECG When compared with ECG of 31-Mar-2022 16:30, PREVIOUS ECG IS PRESENT similar to june this year Confirmed by Merrily Pew [phone]) on 04/19/2022 3:59:40 AM  Radiology DG Chest 2 View  Result Date: 04/19/2022 CLINICAL DATA:  Shortness of breath, weakness. EXAM: CHEST - 2 VIEW COMPARISON:  03/31/2022 FINDINGS: The heart is enlarged and the mediastinal contour stable. The pulmonary vasculature is distended. Perihilar interstitial and airspace opacities are noted bilaterally. No effusion or pneumothorax. No acute osseous abnormality. A stable right internal  jugular central venous catheter is noted. IMPRESSION: 1. Cardiomegaly with pulmonary vascular congestion. 2. Perihilar interstitial and airspace opacities suggesting edema, less likely infiltrate. Electronically Signed   By: Brett Fairy M.D.   On: 04/19/2022 04:10    Procedures Procedures    Medications Ordered in ED Medications  acetaminophen (TYLENOL) tablet 1,000 mg (has no administration in time range)  amLODipine (NORVASC) tablet 10 mg (10 mg Oral Given 04/19/22 0548)  hydrALAZINE (APRESOLINE) tablet 50 mg (50 mg Oral Given  04/19/22 0548)  isosorbide mononitrate (IMDUR) 24 hr tablet 30 mg (30 mg Oral Given 04/19/22 0548)  losartan (COZAAR) tablet 100 mg (0 mg Oral Hold 04/19/22 0549)  Chlorhexidine Gluconate Cloth 2 % PADS 6 each (has no administration in time range)    ED Course/ Medical Decision Making/ A&P                           Medical Decision Making Risk OTC drugs. Prescription drug management.   Fluid overload. Mild Hyper-K, no ecg changes compared to last month.   D/w Dr. Joelyn Oms, will dialyze later today. May return to ED for reeval. Home BP meds provided.    Final Clinical Impression(s) / ED Diagnoses Final diagnoses:  None    Rx / DC Orders ED Discharge Orders     None         Jessly Lebeck, Corene Cornea, MD 04/19/22 [phone]

## 2022-04-19 NOTE — Procedures (Signed)
Pt is relatively new ESRD started in March 2023.  Has L wrist AVF and R IJ TDC. Here w/ SOB of acute onset this am. CXR w/ IS edema, asked to see for dialysis. Pt seen in HD upstairs. No c/o's, alert , no distress. Chest clear, trace LE edema, nonfocal and Ox 3. UF goal 3-4 L. SOB is better on HD per pateint. Plan is for ED HD > pt will be returned to ED post HD for reassessment for possible dc vs admission.    OP HD: MWF SW 3h 58mn  400/800  79kg   Hep 2000  RIJ TDC  / maturing LFA AVF+bruit - last HD 7/7, left at 79.5kg  I was present at this dialysis session, have reviewed the session itself and made  appropriate changes RKelly SplinterMD [place] Woodsp[place] [phone]  04/19/2022, 11:01 AM

## 2022-04-19 NOTE — Progress Notes (Addendum)
Pt receives out-pt HD at [facility] SW on MWF. Pt arrives at 9:40 for 10:10 chair time. Will assist as needed.   [name] Renal Navigator [phone]  Addendum at 2:49 pm: Pt dc to home from ED. Contacted clinic to provide update that pt should resume care on Wednesday.

## 2022-04-19 NOTE — Progress Notes (Signed)
Called by ED physician after patient presented overnight with dyspnea.  Brought in by EMS and requiring 3 L of nasal cannula.  He is hypertensive.   Patient is ESRD MWF at [facility] kidney center.  Last treatment 7/7 on schedule leaving 0.5 kg above EDW with persistent hypertension throughout.  Here labs are notable for potassium of 5.4, BUN 68, hemoglobin 9.9.  2 view chest x-ray with evidence of pulmonary vascular congestion and perihilar interstitial and airspace opacities suggestive of edema.  Outpatient HD Rx: 3 times weekly, 3 hours 45 minutes, 2K, 2 calcium, EDW 79.0 kg, using left upper arm AV fistula  Plan for HD this morning, and then will return to the ED for reassessment for discharge or admission.

## 2022-04-19 NOTE — ED Triage Notes (Signed)
Patient arrived with EMS from home reports SOB with pain across his upper abdomen this morning , denies fever or cough , hemodialysis q Mon/Wed/Fri , he did not miss previous HD . No emesis or diarrhea .

## 2022-04-19 NOTE — ED Notes (Signed)
Report given to Karl Bales, RN of Dialysis

## 2022-04-19 NOTE — ED Provider Notes (Signed)
Patient was seen early this morning for increased shortness of breath.  He has a history of end-stage renal disease and is dialysis dependent.  Dr. Dayna Barker discussed his case with nephrology and the plan was to dialyze and reassess after return from dialysis. Physical Exam  BP (!) 159/81   Pulse 85   Temp 98 F (36.7 C) (Oral)   Resp 18   Wt 80.3 kg   SpO2 97%   BMI 29.46 kg/m   Physical Exam  Procedures  Procedures  ED Course / MDM    Medical Decision Making Risk OTC drugs. Prescription drug management.   Patient states he feels much better since receiving dialysis.  He does not feel he needs to be admitted.  His oxygen level is stable on room air.  He is comfortable plan for discharge and is calling family to get a ride home.       Randy Rasmussen, MD 04/19/22 1734

## 2022-04-19 NOTE — ED Notes (Signed)
ED Provider at bedside. 

## 2022-04-19 NOTE — ED Notes (Signed)
ED provider made aware of patient BP 204/111.

## 2022-04-19 NOTE — Discharge Instructions (Signed)
You were seen in the emergency department for increased shortness of breath.  You received dialysis with improvement in your symptoms.  Please follow-up with your dialysis center as scheduled.  Return to the emergency department if any worsening or concerning symptoms.

## 2022-04-19 NOTE — ED Provider Triage Note (Signed)
Emergency Medicine Provider Triage Evaluation Note  Randy Ross , a 51 y.o. male  was evaluated in triage.  Pt complains of abdominal pain and SOB.  Woke up about 2 hours ago with symptoms. Denies vomiting or diarrhea.  No chest pain, just feels SOB.  Due to HD this am, no recent missed sessions.  EMS reports hypoxic to 70's with fire but able to maintain on RA around 96% while en route.  Review of Systems  Positive: SOB, abdominal pain Negative: Vomiting, chest pain  Physical Exam  BP (!) 165/95 (BP Location: Right Arm)   Pulse 94   Temp 98 F (36.7 C) (Oral)   Resp 17   SpO2 92%   Gen:   Awake, no distress   Resp:  Normal effort  MSK:   Moves extremities without difficulty  Other:    Medical Decision Making  Medically screening exam initiated at 3:44 AM.  Appropriate orders placed.  Randy Ross was informed that the remainder of the evaluation will be completed by another provider, this initial triage assessment does not replace that evaluation, and the importance of remaining in the ED until their evaluation is complete.  Abdominal pain, SOB.  ESRD, due to HD this morning.  No recent missed sessions.  EKG, labs, CXR.   Larene Pickett, PA-C 04/19/22 [phone]

## 2022-04-20 LAB — HEPATITIS B SURFACE ANTIBODY, QUANTITATIVE: Hep B S AB Quant (Post): 78.9 m[IU]/mL (ref >9.9)

## 2022-05-19 ENCOUNTER — Encounter: Payer: Self-pay | Admitting: Emergency Medicine

## 2022-05-19 ENCOUNTER — Ambulatory Visit (INDEPENDENT_AMBULATORY_CARE_PROVIDER_SITE_OTHER): Payer: Commercial Managed Care - HMO

## 2022-05-19 ENCOUNTER — Ambulatory Visit
Admission: EM | Admit: 2022-05-19 | Discharge: 2022-05-19 | Disposition: A | Discharge status: Home / Self Care | Payer: Commercial Managed Care - HMO | Attending: Urgent Care | Admitting: Urgent Care

## 2022-05-19 DIAGNOSIS — Z992 Dependence on renal dialysis: Secondary | ICD-10-CM

## 2022-05-19 DIAGNOSIS — N186 End stage renal disease: Secondary | ICD-10-CM | POA: Diagnosis not present

## 2022-05-19 DIAGNOSIS — R0789 Other chest pain: Secondary | ICD-10-CM

## 2022-05-19 MED ORDER — ACETAMINOPHEN 325 MG PO TABS: 650.0000 mg | ORAL_TABLET | Freq: Four times a day (QID) | ORAL | 0 refills | Status: DC | PRN

## 2022-05-19 MED ORDER — METHOCARBAMOL 500 MG PO TABS: 500.0000 mg | ORAL_TABLET | Freq: Two times a day (BID) | ORAL | 0 refills | Status: DC

## 2022-05-19 NOTE — ED Provider Notes (Signed)
[facility]   MRN: [id number] DOB: Dec 03, 1970  Subjective:   Randy Ross is a 51 y.o. male presenting for 4-day history of acute onset persistent and worsening right-sided rib pain, chest wall pain.  Symptoms started after he got a massage.  This area in particular was not massaged but started when he was being massaged on his back while laying on his stomach.  Since then he has had a lot of focal chest wall pain over the right lateral ribs worse with bending from that area or coughing, taking a deep breath.  No other fall or trauma.  No fever.  Patient has ESRD.  No current facility-administered medications for this encounter.  Current Outpatient Medications:    acetaminophen (TYLENOL) 325 MG tablet, Take 650 mg by mouth every 6 (six) hours as needed for moderate pain., Disp: , Rfl:    acetaminophen (TYLENOL) 500 MG tablet, Take 1 tablet (500 mg total) by mouth every 6 (six) hours as needed for mild pain. (Patient taking differently: Take 1,000 mg by mouth every 6 (six) hours as needed for mild pain or headache.), Disp: 30 tablet, Rfl: 0   amLODipine (NORVASC) 10 MG tablet, Take 1 tablet (10 mg total) by mouth at bedtime. (Patient not taking: Reported on 03/31/2022), Disp: 30 tablet, Rfl: 0   calcitRIOL (ROCALTROL) 0.25 MCG capsule, Take 1 capsule (0.25 mcg total) by mouth daily., Disp: 30 capsule, Rfl: 0   calcium acetate (PHOSLO) 667 MG capsule, Take 2 capsules (1,334 mg total) by mouth 3 (three) times daily with meals., Disp: 180 capsule, Rfl: 0   carvedilol (COREG) 12.5 MG tablet, Take 12.5 mg by mouth daily., Disp: , Rfl:    carvedilol (COREG) 6.25 MG tablet, Take 1 tablet (6.25 mg total) by mouth 2 (two) times daily with a meal. (Patient not taking: Reported on 03/31/2022), Disp: 60 tablet, Rfl: 0   Emollient (CETAPHIL) cream, Apply 1 application  topically daily as needed (itching)., Disp: , Rfl:    hydrALAZINE (APRESOLINE) 50 MG tablet, Take 1 tablet (50 mg  total) by mouth 3 (three) times daily. (Patient not taking: Reported on 03/31/2022), Disp: 90 tablet, Rfl: 0   Insulin Pen Needle 32G X 4 MM MISC, Use to inject insulin daily as directed., Disp: 100 each, Rfl: 0   isosorbide mononitrate (IMDUR) 30 MG 24 hr tablet, Take 1 tablet (30 mg total) by mouth daily., Disp: 30 tablet, Rfl: 0   losartan (COZAAR) 100 MG tablet, Take 100 mg by mouth daily., Disp: , Rfl:    multivitamin (RENA-VIT) TABS tablet, Take 1 tablet by mouth at bedtime., Disp: 30 tablet, Rfl: 0   SEMGLEE, YFGN, 100 UNIT/ML Pen, Inject 10 Units into the skin daily., Disp: , Rfl:    tetrahydrozoline-zinc (VISINE-AC) 0.05-0.25 % ophthalmic solution, Place 1 drop into both eyes 3 (three) times daily as needed (dry eyes)., Disp: , Rfl:    tiZANidine (ZANAFLEX) 4 MG tablet, Take 4 mg by mouth 3 (three) times daily as needed for muscle spasms., Disp: , Rfl:    Allergies  Allergen Reactions   Gabapentin Shortness Of Breath and Rash    Scratchy throat    Metformin And Related Other (See Comments)    Affecting kidney function     Past Medical History:  Diagnosis Date   Anemia    CKD (chronic kidney disease)    on wed 12/09/21   Diabetes mellitus without complication (HCC)    type 2   Hypertension  Past Surgical History:  Procedure Laterality Date   AV FISTULA PLACEMENT Left 12/10/2021   Procedure: LEFT RADIAL CEPHALIC  ARTERIOVENOUS (AV) FISTULA;  Surgeon: Waynetta Sandy, MD;  Location: [facility];  Service: Vascular;  Laterality: Left;   IR FLUORO GUIDE CV LINE RIGHT  12/09/2021   IR US GUIDE VASC ACCESS RIGHT  12/09/2021   LUMBAR [place] SURGERY  2021   L5-S1    History reviewed. No pertinent family history.  Social History   Tobacco Use   Smoking status: Some Days    Packs/day: 0.15    Types: Cigarettes   Smokeless tobacco: Never  Vaping Use   Vaping Use: Never used  Substance Use Topics   Alcohol use: Not Currently   Drug use: Not Currently     ROS   Objective:   Vitals: BP (!) 167/85 Comment: Last dialysis today  Pulse 92   Temp 98.2 F (36.8 C)   Resp (!) 24   SpO2 98%   Physical Exam Constitutional:      General: He is not in acute distress.    Appearance: Normal appearance. He is well-developed and normal weight. He is not ill-appearing, toxic-appearing or diaphoretic.  HENT:     Head: Normocephalic and atraumatic.     Right Ear: External ear normal.     Left Ear: External ear normal.     Nose: Nose normal.     Mouth/Throat:     Mouth: Mucous membranes are moist.  Eyes:     General: No scleral icterus.       Right eye: No discharge.        Left eye: No discharge.     Extraocular Movements: Extraocular movements intact.  Cardiovascular:     Rate and Rhythm: Normal rate and regular rhythm.     Heart sounds: Normal heart sounds. No murmur heard.    No friction rub. No gallop.  Pulmonary:     Effort: Pulmonary effort is normal. No respiratory distress.     Breath sounds: Normal breath sounds. No stridor. No wheezing, rhonchi or rales.  Chest:     Chest wall: Tenderness (area of focal tenderness) present. No mass, lacerations, deformity, swelling, crepitus or edema. There is no dullness to percussion.    Musculoskeletal:     Cervical back: Normal range of motion.  Neurological:     Mental Status: He is alert and oriented to person, place, and time.  Psychiatric:        Mood and Affect: Mood normal.        Behavior: Behavior normal.        Thought Content: Thought content normal.        Judgment: Judgment normal.   DG Ribs Unilateral W/Chest Right  Result Date: 05/19/2022 CLINICAL DATA:  Right chest wall pain since massage 4 days ago. EXAM: RIGHT RIBS AND CHEST - 3+ VIEW COMPARISON:  Chest radiographs 04/19/2022 and 03/31/2022. FINDINGS: The heart is enlarged. Hemodialysis catheter has been removed in the interval. The pulmonary edema has resolved, although there is possible mild residual vascular  congestion. No confluent airspace opacity, pleural effusion or pneumothorax. No evidence of acute right-sided rib fracture or focal rib lesion. There is no marker to indicate the specific area of pain. IMPRESSION: 1. No evidence of acute right-sided rib fracture, pleural effusion or pneumothorax. 2. Interval improved pulmonary edema. Electronically Signed   By: Richardean Sale M.D.   On: 05/19/2022 17:10    Assessment and Plan :  PDMP not reviewed this encounter.  1. Right-sided chest wall pain   2. ESRD on dialysis [facility])    Chest x-ray negative for an acute cardiopulmonary problem.  Recommended conservative management for right-sided chest wall pain with Tylenol and Robaxin safe with his end-stage renal disease. Counseled patient on potential for adverse effects with medications prescribed/recommended today, ER and return-to-clinic precautions discussed, patient verbalized understanding.    Jaynee Eagles, [place] 05/19/22 [phone]

## 2022-05-19 NOTE — ED Triage Notes (Signed)
4 days ago, the [place] and felt something in right rib pop when he was getting a massage and it has hurt since. Hurts to breathe and cough.

## 2022-07-21 ENCOUNTER — Other Ambulatory Visit: Payer: Self-pay

## 2022-07-21 ENCOUNTER — Encounter (HOSPITAL_COMMUNITY): Payer: Self-pay | Admitting: Emergency Medicine

## 2022-07-21 ENCOUNTER — Emergency Department (HOSPITAL_COMMUNITY)
Admission: EM | Admit: 2022-07-21 | Discharge: 2022-07-22 | Discharge status: Against Medical Advice | Payer: No Typology Code available for payment source | Attending: Student | Admitting: Student

## 2022-07-21 DIAGNOSIS — Z992 Dependence on renal dialysis: Secondary | ICD-10-CM | POA: Insufficient documentation

## 2022-07-21 DIAGNOSIS — Z5321 Procedure and treatment not carried out due to patient leaving prior to being seen by health care provider: Secondary | ICD-10-CM | POA: Diagnosis not present

## 2022-07-21 DIAGNOSIS — F17210 Nicotine dependence, cigarettes, uncomplicated: Secondary | ICD-10-CM | POA: Diagnosis not present

## 2022-07-21 DIAGNOSIS — M6281 Muscle weakness (generalized): Secondary | ICD-10-CM | POA: Diagnosis present

## 2022-07-21 LAB — COMPREHENSIVE METABOLIC PANEL
ALT: 26 U/L (ref 0–44)
AST: 17 U/L (ref 15–41)
Albumin: 3.5 g/dL (ref 3.5–5.0)
Alkaline Phosphatase: 71 U/L (ref 38–126)
Anion gap: 12 (ref 5–15)
BUN: 28 mg/dL — ABNORMAL HIGH (ref 6–20)
CO2: 31 mmol/L (ref 22–32)
Calcium: 8.2 mg/dL — ABNORMAL LOW (ref 8.9–10.3)
Chloride: 95 mmol/L — ABNORMAL LOW (ref 98–111)
Creatinine, Ser: 8.05 mg/dL — ABNORMAL HIGH (ref 0.61–1.24)
GFR, Estimated: 8 mL/min — ABNORMAL LOW (ref >60)
Glucose, Bld: 142 mg/dL — ABNORMAL HIGH (ref 70–99)
Potassium: 4.3 mmol/L (ref 3.5–5.1)
Sodium: 138 mmol/L (ref 135–145)
Total Bilirubin: 0.5 mg/dL (ref 0.3–1.2)
Total Protein: 7.6 g/dL (ref 6.5–8.1)

## 2022-07-21 LAB — TROPONIN I (HIGH SENSITIVITY): Troponin I (High Sensitivity): 25 ng/L — ABNORMAL HIGH (ref <18)

## 2022-07-21 LAB — CBC WITH DIFFERENTIAL/PLATELET
Abs Immature Granulocytes: 0.04 10*3/uL (ref 0.00–0.07)
Basophils Absolute: 0.1 10*3/uL (ref 0.0–0.1)
Basophils Relative: 1 %
Eosinophils Absolute: 0.2 10*3/uL (ref 0.0–0.5)
Eosinophils Relative: 2 %
HCT: 33.9 % — ABNORMAL LOW (ref 39.0–52.0)
Hemoglobin: 10.8 g/dL — ABNORMAL LOW (ref 13.0–17.0)
Immature Granulocytes: 0 %
Lymphocytes Relative: 8 %
Lymphs Abs: 0.9 10*3/uL (ref 0.7–4.0)
MCH: 21.6 pg — ABNORMAL LOW (ref 26.0–34.0)
MCHC: 31.9 g/dL (ref 30.0–36.0)
MCV: 67.7 fL — ABNORMAL LOW (ref 80.0–100.0)
Monocytes Absolute: 0.9 10*3/uL (ref 0.1–1.0)
Monocytes Relative: 7 %
Neutro Abs: 9.9 10*3/uL — ABNORMAL HIGH (ref 1.7–7.7)
Neutrophils Relative %: 82 %
Platelets: 316 10*3/uL (ref 150–400)
RBC: 5.01 MIL/uL (ref 4.22–5.81)
RDW: 18.9 % — ABNORMAL HIGH (ref 11.5–15.5)
WBC: 12 10*3/uL — ABNORMAL HIGH (ref 4.0–10.5)
nRBC: 0 % (ref 0.0–0.2)

## 2022-07-21 LAB — MAGNESIUM: Magnesium: 2.1 mg/dL (ref 1.7–2.4)

## 2022-07-21 NOTE — ED Triage Notes (Signed)
Pt brought to ED by [facility] with c/o weakness. Is hemodialysis pt and received full treatment today. Pt initially hypotensive with SBP 90. 300CC fluid bolus administered and pt currently normotensive.   EMS Interventions 20g RAC 300CC NS bolus  EMS Vitals BP 148/78 HR 66 RR 18 SPO2 96% [id number]

## 2022-07-21 NOTE — ED Provider Triage Note (Signed)
Emergency Medicine Provider Triage Evaluation Note  Jr Rolfe , a 51 y.o. male  was evaluated in triage.  Pt complains of generalized weakness.  Patient reports that he woke up feeling okay today.  Patient went to dialysis and had a full session.  When he went home he went outside to smoke a cigarette and felt very weak.  Patient initially has some shortness of breath but this quickly resolved.  No chest pain.  Patient was noted to be hypotensive on arrival by EMS.  Patient was given fluids and blood pressure improved.  Patient denies any complaints at this time.  Patient reports history of same after smoking a cigarette.  Patient denies any fevers.  Denies any abdominal pain.  No nausea or vomiting..  Review of Systems  Positive: Weakness, fatigue, sob Negative: Chest pain  Physical Exam  BP 124/81 (BP Location: Right Arm)   Pulse 73   Temp 97.9 F (36.6 C)   Resp 19   SpO2 99%  Gen:   Awake, no distress   Resp:  Normal effort  MSK:   Moves extremities without difficulty  Other:  Heart regular rate and rhythm.  Lungs clear to auscultation bilaterally.  No lower extremity swelling appreciated.  Patient alert and oriented x3.  Medical Decision Making  Medically screening exam initiated at 9:43 PM.  Appropriate orders placed.  Gurkirat Wedel was informed that the remainder of the evaluation will be completed by another provider, this initial triage assessment does not replace that evaluation, and the importance of remaining in the ED until their evaluation is complete.  Patient with generalized weakness after dialysis today.  Patient initially reported shortness of over this quickly resolved.  No chest pain.  Basic labs and EKG is pending at this time.   Doristine Devoid, PA-C 07/21/22 2145

## 2022-07-22 NOTE — ED Notes (Signed)
NA x4 for vitals

## 2022-08-20 ENCOUNTER — Emergency Department (HOSPITAL_COMMUNITY): Payer: Self-pay

## 2022-08-20 ENCOUNTER — Encounter (HOSPITAL_COMMUNITY): Payer: Self-pay

## 2022-08-20 ENCOUNTER — Encounter (HOSPITAL_COMMUNITY): Payer: Self-pay | Admitting: Emergency Medicine

## 2022-08-20 ENCOUNTER — Emergency Department (HOSPITAL_COMMUNITY)
Admission: EM | Admit: 2022-08-20 | Discharge: 2022-08-20 | Discharge status: Against Medical Advice | Payer: Self-pay | Attending: Emergency Medicine | Admitting: Emergency Medicine

## 2022-08-20 ENCOUNTER — Other Ambulatory Visit: Payer: Self-pay

## 2022-08-20 DIAGNOSIS — D631 Anemia in chronic kidney disease: Secondary | ICD-10-CM | POA: Insufficient documentation

## 2022-08-20 DIAGNOSIS — I120 Hypertensive chronic kidney disease with stage 5 chronic kidney disease or end stage renal disease: Secondary | ICD-10-CM | POA: Insufficient documentation

## 2022-08-20 DIAGNOSIS — N189 Chronic kidney disease, unspecified: Secondary | ICD-10-CM

## 2022-08-20 DIAGNOSIS — Z79899 Other long term (current) drug therapy: Secondary | ICD-10-CM | POA: Insufficient documentation

## 2022-08-20 DIAGNOSIS — N186 End stage renal disease: Secondary | ICD-10-CM | POA: Insufficient documentation

## 2022-08-20 DIAGNOSIS — E1122 Type 2 diabetes mellitus with diabetic chronic kidney disease: Secondary | ICD-10-CM | POA: Insufficient documentation

## 2022-08-20 DIAGNOSIS — R778 Other specified abnormalities of plasma proteins: Secondary | ICD-10-CM | POA: Insufficient documentation

## 2022-08-20 DIAGNOSIS — J810 Acute pulmonary edema: Secondary | ICD-10-CM | POA: Insufficient documentation

## 2022-08-20 DIAGNOSIS — R7989 Other specified abnormal findings of blood chemistry: Secondary | ICD-10-CM

## 2022-08-20 DIAGNOSIS — Z794 Long term (current) use of insulin: Secondary | ICD-10-CM | POA: Insufficient documentation

## 2022-08-20 DIAGNOSIS — D72829 Elevated white blood cell count, unspecified: Secondary | ICD-10-CM | POA: Insufficient documentation

## 2022-08-20 DIAGNOSIS — Z992 Dependence on renal dialysis: Secondary | ICD-10-CM | POA: Insufficient documentation

## 2022-08-20 HISTORY — DX: Disorder of kidney and ureter, unspecified: N28.9

## 2022-08-20 LAB — CBC WITH DIFFERENTIAL/PLATELET
Abs Immature Granulocytes: 0.06 10*3/uL (ref 0.00–0.07)
Basophils Absolute: 0 10*3/uL (ref 0.0–0.1)
Basophils Relative: 0 %
Eosinophils Absolute: 0.4 10*3/uL (ref 0.0–0.5)
Eosinophils Relative: 3 %
HCT: 27.1 % — ABNORMAL LOW (ref 39.0–52.0)
Hemoglobin: 8.7 g/dL — ABNORMAL LOW (ref 13.0–17.0)
Immature Granulocytes: 1 %
Lymphocytes Relative: 10 %
Lymphs Abs: 1.2 10*3/uL (ref 0.7–4.0)
MCH: 21.2 pg — ABNORMAL LOW (ref 26.0–34.0)
MCHC: 32.1 g/dL (ref 30.0–36.0)
MCV: 65.9 fL — ABNORMAL LOW (ref 80.0–100.0)
Monocytes Absolute: 0.8 10*3/uL (ref 0.1–1.0)
Monocytes Relative: 7 %
Neutro Abs: 9.3 10*3/uL — ABNORMAL HIGH (ref 1.7–7.7)
Neutrophils Relative %: 79 %
Platelets: 309 10*3/uL (ref 150–400)
RBC: 4.11 MIL/uL — ABNORMAL LOW (ref 4.22–5.81)
RDW: 19.3 % — ABNORMAL HIGH (ref 11.5–15.5)
WBC: 11.7 10*3/uL — ABNORMAL HIGH (ref 4.0–10.5)
nRBC: 0 % (ref 0.0–0.2)

## 2022-08-20 LAB — BASIC METABOLIC PANEL
Anion gap: 16 — ABNORMAL HIGH (ref 5–15)
BUN: 40 mg/dL — ABNORMAL HIGH (ref 6–20)
CO2: 30 mmol/L (ref 22–32)
Calcium: 8.3 mg/dL — ABNORMAL LOW (ref 8.9–10.3)
Chloride: 94 mmol/L — ABNORMAL LOW (ref 98–111)
Creatinine, Ser: 10.63 mg/dL — ABNORMAL HIGH (ref 0.61–1.24)
GFR, Estimated: 5 mL/min — ABNORMAL LOW (ref >60)
Glucose, Bld: 116 mg/dL — ABNORMAL HIGH (ref 70–99)
Potassium: 4.7 mmol/L (ref 3.5–5.1)
Sodium: 140 mmol/L (ref 135–145)

## 2022-08-20 LAB — TROPONIN I (HIGH SENSITIVITY)
Troponin I (High Sensitivity): 46 ng/L — ABNORMAL HIGH (ref <18)
Troponin I (High Sensitivity): 54 ng/L — ABNORMAL HIGH (ref <18)

## 2022-08-20 NOTE — ED Notes (Signed)
Patient signed consent form for AMA , EDP explained consequences on his decision .

## 2022-08-20 NOTE — ED Triage Notes (Addendum)
Pt arrived POV from home stating he can't sleep d/t SHOB. Pt states it started a couple of hours ago. Pt also endorses right arm pain. Pt is a dialysis pt that goes M,W,F. Pt is left arm restricted.

## 2022-08-20 NOTE — ED Provider Triage Note (Addendum)
Emergency Medicine Provider Triage Evaluation Note  Arles Rumbold , a 51 y.o. male  was evaluated in triage.  Pt complains of SOB.  Initially had right arm pain as well but that has since resolved.  States he cannot sleep because he feels SOB.  Denies cardiac hx.  Hx of ESRD-- schedule MWF.  Review of Systems  Positive: SOB Negative: fever  Physical Exam  BP (!) 207/124   Pulse 88   Temp 97.9 F (36.6 C) (Oral)   Resp (!) 24   SpO2 93%   Gen:   Awake, no distress   Resp:  Normal effort  MSK:   Moves extremities without difficulty  Other:    Medical Decision Making  Medically screening exam initiated at 3:12 AM.  Appropriate orders placed.  Kenyatta Sisomhone was informed that the remainder of the evaluation will be completed by another provider, this initial triage assessment does not replace that evaluation, and the importance of remaining in the ED until their evaluation is complete.  SOB.  Hypertensive in triage-- BP 207/124.  EKG, labs, CXR.   Larene Pickett, PA-C 08/20/22 0314    Larene Pickett, PA-C 08/20/22 [phone]

## 2022-08-20 NOTE — Progress Notes (Signed)
Late Entry Note:  Received a message from nephrologist requesting pt receive out-pt HD treatment at clinic today. Contacted [facility] and spoke to Basye. Today is pt's normal day and clinic can treat at normal time. Spoke to pt via phone to make him aware of this info. Pt is on his way to clinic now for treatment. Update provided to nephrologist.   [name] Renal Navigator [phone]

## 2022-08-20 NOTE — ED Provider Notes (Addendum)
[facility] EMERGENCY DEPARTMENT Provider Note   CSN: [id number] Arrival date & time: 08/20/22  0257     History  Chief Complaint  Patient presents with   Shortness of Breath    Randy Ross is a 51 y.o. male.  The history is provided by the patient and a relative.  Shortness of Breath He has history of hypertension, diabetes, end-stage renal disease on hemodialysis and comes in because of shortness of breath.  He noted last night that he got short of breath if he lay flat.  Similar symptoms were present to a lesser degree the night before.  He has had a nonproductive cough but denies fever or chills or sweats.  He denies chest pain, heaviness, tightness, pressure.  He does not think he ate things with excess salt in them.   Home Medications Prior to Admission medications   Medication Sig Start Date End Date Taking? Authorizing Provider  amLODipine (NORVASC) 10 MG tablet Take 10 mg by mouth at bedtime. 04/23/22  Yes [provider]  calcium acetate (PHOSLO) 667 MG capsule Take 1,334 mg by mouth 3 (three) times daily. 03/31/22  Yes [provider]  carvedilol (COREG) 25 MG tablet Take 25 mg by mouth 2 (two) times daily. 05/31/22  Yes [provider]  isosorbide mononitrate (IMDUR) 30 MG 24 hr tablet Take 30 mg by mouth at bedtime. 04/23/22  Yes [provider]  SEMGLEE, YFGN, 100 UNIT/ML Pen SMARTSIG:10 Unit(s) SUB-Q Daily 04/23/22  Yes [provider]      Allergies    Gabapentin and Metformin and related    Review of Systems   Review of Systems  Respiratory:  Positive for shortness of breath.   All other systems reviewed and are negative.   Physical Exam Updated Vital Signs BP (!) 207/124   Pulse 88   Temp 97.9 F (36.6 C) (Oral)   Resp (!) 24   SpO2 93%  Physical Exam Vitals and nursing note reviewed.   51 year old male, resting comfortably and in no acute distress. Vital signs are significant for  elevated respiratory rate and elevated blood pressure. Oxygen saturation is 93%, which is normal. Head is normocephalic and atraumatic. PERRLA, EOMI. Oropharynx is clear. Neck is nontender and supple without adenopathy or JVD. Back is nontender and there is no CVA tenderness.  There is trace presacral edema. Lungs have rales at the right base without wheezes or rhonchi. Chest is nontender. Heart has regular rate and rhythm without murmur. Abdomen is soft, flat, nontender. Extremities have no cyanosis or edema, full range of motion is present.  AV fistula is present in the distal left forearm with thrill present. Skin is warm and dry without rash. Neurologic: Mental status is normal, cranial nerves are intact, moves all extremities equally.  ED Results / Procedures / Treatments   Labs (all labs ordered are listed, but only abnormal results are displayed) Labs Reviewed  CBC WITH DIFFERENTIAL/PLATELET - Abnormal; Notable for the following components:      Result Value   WBC 11.7 (*)    RBC 4.11 (*)    Hemoglobin 8.7 (*)    HCT 27.1 (*)    MCV 65.9 (*)    MCH 21.2 (*)    RDW 19.3 (*)    Neutro Abs 9.3 (*)    All other components within normal limits  BASIC METABOLIC PANEL - Abnormal; Notable for the following components:   Chloride 94 (*)    Glucose, Bld  116 (*)    BUN 40 (*)    Creatinine, Ser 10.63 (*)    Calcium 8.3 (*)    GFR, Estimated 5 (*)    Anion gap 16 (*)    All other components within normal limits  TROPONIN I (HIGH SENSITIVITY) - Abnormal; Notable for the following components:   Troponin I (High Sensitivity) 46 (*)    All other components within normal limits  TROPONIN I (HIGH SENSITIVITY)    EKG EKG Interpretation  Date/Time:  Friday August 20 2022 03:13:12 EST Ventricular Rate:  81 PR Interval:  146 QRS Duration: 92 QT Interval:  426 QTC Calculation: 494 R Axis:   69 Text Interpretation: Normal sinus rhythm Marked ST abnormality, possible inferior  subendocardial injury Prolonged QT Abnormal ECG No previous ECGs available Confirmed by Delora Fuel ([id number]) on 08/20/2022 3:21:41 AM  Radiology DG Chest 2 View  Result Date: 08/20/2022 CLINICAL DATA:  Shortness of breath EXAM: CHEST - 2 VIEW COMPARISON:  None Available. FINDINGS: Cardiomegaly. Pulmonary vascular congestion. Aortic atherosclerotic calcification. Hazy airspace opacities in the lower lungs. No pleural effusion or pneumothorax. No acute osseous abnormality. IMPRESSION: Cardiomegaly and suspected pulmonary edema, infection considered less likely. Electronically Signed   By: Placido Sou M.D.   On: 08/20/2022 03:44    Procedures Procedures    Medications Ordered in ED Medications - No data to display  ED Course/ Medical Decision Making/ A&P                           Medical Decision Making  Shortness of breath which appears to be fluid overload.  Doubt pneumonia, pulmonary embolism.  Chest x-ray shows cardiomegaly with pulmonary edema.  I have independently viewed the images, and agree with radiologist's interpretation.  I have reviewed and interpreted his electrocardiogram, my interpretation is nonspecific ST changes possibly consistent with ischemia, borderline prolonged QT interval with no prior ECG available for comparison.  I have reviewed and interpreted his laboratory tests, and my interpretation is elevated BUN and creatinine secondary to known end-stage renal disease, mildly elevated glucose, moderate anemia consistent with anemia of chronic kidney disease, mild leukocytosis which is nonspecific, mildly elevated troponin which is felt to be a combination of demand ischemia and decreased renal clearance but no evidence of non-STEMI.  While in the ED, oxygen saturation is noted to be 88 to 90% on room air.  Therefore, I do not feel he is safe for discharge to go to his scheduled dialysis later today.  I have discussed the case with Dr. Carolin Sicks of nephrology service who  agrees to make arrangements for dialysis in the hospital.  Final Clinical Impression(s) / ED Diagnoses Final diagnoses:  Acute pulmonary edema ([place])  End-stage renal disease on hemodialysis ([place])  Anemia associated with chronic renal failure  Elevated troponin    Rx / DC Orders ED Discharge Orders     None         Delora Fuel, MD 97/02/63 [phone]  Patient stated that he wished to leave [facility].  I have talked with the patient explaining that he became hypoxic when he was off of oxygen and that it was not safe for him to leave.  Patient seemed to accept this, but shortly after that, he told the nurse that he wanted to leave [place].  I did not have a chance to speak with him the second time.    Delora Fuel, MD 85/02/77 [phone]

## 2022-08-24 ENCOUNTER — Emergency Department (HOSPITAL_COMMUNITY): Payer: No Typology Code available for payment source

## 2022-08-24 ENCOUNTER — Inpatient Hospital Stay (HOSPITAL_COMMUNITY): Payer: No Typology Code available for payment source

## 2022-08-24 ENCOUNTER — Inpatient Hospital Stay (HOSPITAL_COMMUNITY)
Admission: EM | Admit: 2022-08-24 | Discharge: 2022-10-14 | DRG: 064 | Disposition: A | Discharge status: Rehabilitation Facility | Payer: No Typology Code available for payment source | Attending: Internal Medicine | Admitting: Internal Medicine

## 2022-08-24 DIAGNOSIS — N186 End stage renal disease: Secondary | ICD-10-CM | POA: Diagnosis present

## 2022-08-24 DIAGNOSIS — G9341 Metabolic encephalopathy: Secondary | ICD-10-CM | POA: Diagnosis present

## 2022-08-24 DIAGNOSIS — I614 Nontraumatic intracerebral hemorrhage in cerebellum: Principal | ICD-10-CM | POA: Diagnosis present

## 2022-08-24 DIAGNOSIS — J9601 Acute respiratory failure with hypoxia: Secondary | ICD-10-CM | POA: Diagnosis present

## 2022-08-24 DIAGNOSIS — G935 Compression of brain: Secondary | ICD-10-CM | POA: Diagnosis present

## 2022-08-24 DIAGNOSIS — Y832 Surgical operation with anastomosis, bypass or graft as the cause of abnormal reaction of the patient, or of later complication, without mention of misadventure at the time of the procedure: Secondary | ICD-10-CM | POA: Diagnosis present

## 2022-08-24 DIAGNOSIS — K922 Gastrointestinal hemorrhage, unspecified: Secondary | ICD-10-CM | POA: Diagnosis not present

## 2022-08-24 DIAGNOSIS — Z992 Dependence on renal dialysis: Secondary | ICD-10-CM | POA: Diagnosis not present

## 2022-08-24 DIAGNOSIS — Z823 Family history of stroke: Secondary | ICD-10-CM

## 2022-08-24 DIAGNOSIS — D631 Anemia in chronic kidney disease: Secondary | ICD-10-CM | POA: Diagnosis present

## 2022-08-24 DIAGNOSIS — E785 Hyperlipidemia, unspecified: Secondary | ICD-10-CM | POA: Diagnosis present

## 2022-08-24 DIAGNOSIS — D62 Acute posthemorrhagic anemia: Secondary | ICD-10-CM | POA: Diagnosis present

## 2022-08-24 DIAGNOSIS — I161 Hypertensive emergency: Secondary | ICD-10-CM | POA: Diagnosis present

## 2022-08-24 DIAGNOSIS — R1310 Dysphagia, unspecified: Secondary | ICD-10-CM | POA: Diagnosis not present

## 2022-08-24 DIAGNOSIS — I639 Cerebral infarction, unspecified: Secondary | ICD-10-CM | POA: Insufficient documentation

## 2022-08-24 DIAGNOSIS — I619 Nontraumatic intracerebral hemorrhage, unspecified: Secondary | ICD-10-CM | POA: Diagnosis not present

## 2022-08-24 DIAGNOSIS — Z79899 Other long term (current) drug therapy: Secondary | ICD-10-CM

## 2022-08-24 DIAGNOSIS — E1165 Type 2 diabetes mellitus with hyperglycemia: Secondary | ICD-10-CM | POA: Diagnosis present

## 2022-08-24 DIAGNOSIS — I6340 Cerebral infarction due to embolism of unspecified cerebral artery: Secondary | ICD-10-CM | POA: Diagnosis not present

## 2022-08-24 DIAGNOSIS — Z7189 Other specified counseling: Secondary | ICD-10-CM | POA: Diagnosis not present

## 2022-08-24 DIAGNOSIS — G8194 Hemiplegia, unspecified affecting left nondominant side: Secondary | ICD-10-CM | POA: Diagnosis present

## 2022-08-24 DIAGNOSIS — F17210 Nicotine dependence, cigarettes, uncomplicated: Secondary | ICD-10-CM | POA: Diagnosis present

## 2022-08-24 DIAGNOSIS — G936 Cerebral edema: Secondary | ICD-10-CM | POA: Diagnosis present

## 2022-08-24 DIAGNOSIS — Z888 Allergy status to other drugs, medicaments and biological substances status: Secondary | ICD-10-CM

## 2022-08-24 DIAGNOSIS — L89152 Pressure ulcer of sacral region, stage 2: Secondary | ICD-10-CM | POA: Diagnosis not present

## 2022-08-24 DIAGNOSIS — E875 Hyperkalemia: Secondary | ICD-10-CM | POA: Diagnosis not present

## 2022-08-24 DIAGNOSIS — R471 Dysarthria and anarthria: Secondary | ICD-10-CM | POA: Diagnosis present

## 2022-08-24 DIAGNOSIS — L8990 Pressure ulcer of unspecified site, unspecified stage: Secondary | ICD-10-CM | POA: Insufficient documentation

## 2022-08-24 DIAGNOSIS — N185 Chronic kidney disease, stage 5: Secondary | ICD-10-CM | POA: Diagnosis not present

## 2022-08-24 DIAGNOSIS — M898X9 Other specified disorders of bone, unspecified site: Secondary | ICD-10-CM | POA: Diagnosis present

## 2022-08-24 DIAGNOSIS — E8809 Other disorders of plasma-protein metabolism, not elsewhere classified: Secondary | ICD-10-CM | POA: Diagnosis present

## 2022-08-24 DIAGNOSIS — E870 Hyperosmolality and hypernatremia: Secondary | ICD-10-CM | POA: Diagnosis not present

## 2022-08-24 DIAGNOSIS — Z515 Encounter for palliative care: Secondary | ICD-10-CM | POA: Diagnosis not present

## 2022-08-24 DIAGNOSIS — T82818A Embolism of vascular prosthetic devices, implants and grafts, initial encounter: Secondary | ICD-10-CM | POA: Diagnosis present

## 2022-08-24 DIAGNOSIS — D649 Anemia, unspecified: Secondary | ICD-10-CM | POA: Diagnosis present

## 2022-08-24 DIAGNOSIS — J9600 Acute respiratory failure, unspecified whether with hypoxia or hypercapnia: Secondary | ICD-10-CM | POA: Diagnosis not present

## 2022-08-24 DIAGNOSIS — E871 Hypo-osmolality and hyponatremia: Secondary | ICD-10-CM | POA: Diagnosis not present

## 2022-08-24 DIAGNOSIS — Z781 Physical restraint status: Secondary | ICD-10-CM

## 2022-08-24 DIAGNOSIS — J690 Pneumonitis due to inhalation of food and vomit: Secondary | ICD-10-CM | POA: Diagnosis present

## 2022-08-24 DIAGNOSIS — E1122 Type 2 diabetes mellitus with diabetic chronic kidney disease: Secondary | ICD-10-CM | POA: Diagnosis present

## 2022-08-24 DIAGNOSIS — D472 Monoclonal gammopathy: Secondary | ICD-10-CM | POA: Diagnosis present

## 2022-08-24 DIAGNOSIS — Z794 Long term (current) use of insulin: Secondary | ICD-10-CM | POA: Diagnosis not present

## 2022-08-24 DIAGNOSIS — R29705 NIHSS score 5: Secondary | ICD-10-CM | POA: Diagnosis present

## 2022-08-24 DIAGNOSIS — R4587 Impulsiveness: Secondary | ICD-10-CM | POA: Diagnosis present

## 2022-08-24 DIAGNOSIS — N179 Acute kidney failure, unspecified: Secondary | ICD-10-CM

## 2022-08-24 DIAGNOSIS — E878 Other disorders of electrolyte and fluid balance, not elsewhere classified: Secondary | ICD-10-CM | POA: Diagnosis present

## 2022-08-24 DIAGNOSIS — E119 Type 2 diabetes mellitus without complications: Secondary | ICD-10-CM

## 2022-08-24 DIAGNOSIS — G9340 Encephalopathy, unspecified: Secondary | ICD-10-CM | POA: Diagnosis not present

## 2022-08-24 DIAGNOSIS — N2581 Secondary hyperparathyroidism of renal origin: Secondary | ICD-10-CM | POA: Diagnosis present

## 2022-08-24 DIAGNOSIS — J150 Pneumonia due to Klebsiella pneumoniae: Secondary | ICD-10-CM | POA: Diagnosis present

## 2022-08-24 DIAGNOSIS — I120 Hypertensive chronic kidney disease with stage 5 chronic kidney disease or end stage renal disease: Secondary | ICD-10-CM | POA: Diagnosis present

## 2022-08-24 DIAGNOSIS — T82898A Other specified complication of vascular prosthetic devices, implants and grafts, initial encounter: Secondary | ICD-10-CM | POA: Diagnosis not present

## 2022-08-24 DIAGNOSIS — I959 Hypotension, unspecified: Secondary | ICD-10-CM | POA: Diagnosis present

## 2022-08-24 DIAGNOSIS — K5900 Constipation, unspecified: Secondary | ICD-10-CM | POA: Diagnosis not present

## 2022-08-24 LAB — CBC
HCT: 29.5 % — ABNORMAL LOW (ref 39.0–52.0)
Hemoglobin: 9.2 g/dL — ABNORMAL LOW (ref 13.0–17.0)
MCH: 20.6 pg — ABNORMAL LOW (ref 26.0–34.0)
MCHC: 31.2 g/dL (ref 30.0–36.0)
MCV: 66.1 fL — ABNORMAL LOW (ref 80.0–100.0)
Platelets: 280 10*3/uL (ref 150–400)
RBC: 4.46 MIL/uL (ref 4.22–5.81)
RDW: 19.9 % — ABNORMAL HIGH (ref 11.5–15.5)
WBC: 9.3 10*3/uL (ref 4.0–10.5)
nRBC: 0 % (ref 0.0–0.2)

## 2022-08-24 LAB — DIFFERENTIAL
Abs Immature Granulocytes: 0.02 10*3/uL (ref 0.00–0.07)
Basophils Absolute: 0.1 10*3/uL (ref 0.0–0.1)
Basophils Relative: 1 %
Eosinophils Absolute: 0.2 10*3/uL (ref 0.0–0.5)
Eosinophils Relative: 2 %
Immature Granulocytes: 0 %
Lymphocytes Relative: 17 %
Lymphs Abs: 1.6 10*3/uL (ref 0.7–4.0)
Monocytes Absolute: 1.2 10*3/uL — ABNORMAL HIGH (ref 0.1–1.0)
Monocytes Relative: 12 %
Neutro Abs: 6.3 10*3/uL (ref 1.7–7.7)
Neutrophils Relative %: 68 %

## 2022-08-24 LAB — I-STAT CHEM 8, ED
BUN: 40 mg/dL — ABNORMAL HIGH (ref 6–20)
Calcium, Ion: 1.09 mmol/L — ABNORMAL LOW (ref 1.15–1.40)
Chloride: 96 mmol/L — ABNORMAL LOW (ref 98–111)
Creatinine, Ser: 12.1 mg/dL — ABNORMAL HIGH (ref 0.61–1.24)
Glucose, Bld: 155 mg/dL — ABNORMAL HIGH (ref 70–99)
HCT: 31 % — ABNORMAL LOW (ref 39.0–52.0)
Hemoglobin: 10.5 g/dL — ABNORMAL LOW (ref 13.0–17.0)
Potassium: 4.5 mmol/L (ref 3.5–5.1)
Sodium: 138 mmol/L (ref 135–145)
TCO2: 29 mmol/L (ref 22–32)

## 2022-08-24 LAB — APTT: aPTT: 31 seconds (ref 24–36)

## 2022-08-24 LAB — CBG MONITORING, ED: Glucose-Capillary: 162 mg/dL — ABNORMAL HIGH (ref 70–99)

## 2022-08-24 LAB — PROTIME-INR
INR: 1 (ref 0.8–1.2)
Prothrombin Time: 13.2 seconds (ref 11.4–15.2)

## 2022-08-24 MED ORDER — LABETALOL HCL 5 MG/ML IV SOLN
10.0000 mg | Freq: Once | INTRAVENOUS | Status: AC
  Administered 2022-08-25: 10 mg via INTRAVENOUS

## 2022-08-24 MED ORDER — ROCURONIUM BROMIDE 50 MG/5ML IV SOLN
INTRAVENOUS | Status: DC | PRN
  Administered 2022-08-24: 100 mg via INTRAVENOUS

## 2022-08-24 MED ORDER — ETOMIDATE 2 MG/ML IV SOLN
INTRAVENOUS | Status: DC | PRN
  Administered 2022-08-24: 30 mg via INTRAVENOUS

## 2022-08-24 MED ORDER — SUGAMMADEX SODIUM 200 MG/2ML IV SOLN
16.0000 mg/kg | Freq: Once | INTRAVENOUS | Status: AC
  Administered 2022-08-25: 1290 mg via INTRAVENOUS
  Filled 2022-08-24: qty 14

## 2022-08-24 MED ORDER — SODIUM CHLORIDE 0.9% FLUSH
3.0000 mL | Freq: Once | INTRAVENOUS | Status: AC
  Administered 2022-08-25: 3 mL via INTRAVENOUS

## 2022-08-24 MED ORDER — CLEVIDIPINE BUTYRATE 0.5 MG/ML IV EMUL
0.0000 mg/h | INTRAVENOUS | Status: DC
  Administered 2022-08-25: 2 mg/h via INTRAVENOUS
  Administered 2022-08-25: 4 mg/h via INTRAVENOUS
  Administered 2022-08-25: 2 mg/h via INTRAVENOUS
  Administered 2022-08-25: 12 mg/h via INTRAVENOUS
  Administered 2022-08-26: 21 mg/h via INTRAVENOUS
  Administered 2022-08-26 (×2): 18 mg/h via INTRAVENOUS
  Administered 2022-08-26: 14 mg/h via INTRAVENOUS
  Administered 2022-08-26 (×5): 21 mg/h via INTRAVENOUS
  Administered 2022-08-26: 12 mg/h via INTRAVENOUS
  Administered 2022-08-26: 21 mg/h via INTRAVENOUS
  Administered 2022-08-27: 15 mg/h via INTRAVENOUS
  Administered 2022-08-28: 2 mg/h via INTRAVENOUS
  Filled 2022-08-24 (×6): qty 100
  Filled 2022-08-24 (×2): qty 50
  Filled 2022-08-24: qty 100
  Filled 2022-08-24: qty 50
  Filled 2022-08-24 (×3): qty 100
  Filled 2022-08-24: qty 50
  Filled 2022-08-24: qty 100
  Filled 2022-08-24: qty 50

## 2022-08-24 NOTE — ED Provider Notes (Signed)
[facility] EMERGENCY DEPARTMENT Provider Note  CSN: [id number] Arrival date & time: 08/24/22 2325  Chief Complaint(s) No chief complaint on file.  HPI Randy Ross is a 51 y.o. male with a past medical history listed below including ESRD on dialysis MWF, hypertension who presents to the emergency department as a code stroke for left-sided visual preference, slurred speech and left-sided weakness.  Patient was noted to be severely hypertensive with EMS with systolics greater than 416S.  Patient also had nausea vomiting and complaining of severe headache.  Patient is not anticoagulated.  Patient was compliant with dialysis and last went yesterday.  The history is provided by the EMS personnel.    Past Medical History Past Medical History:  Diagnosis Date  . Anemia   . CKD (chronic kidney disease)    on wed 12/09/21  . Diabetes mellitus without complication ([place])    type 2  . Diabetes mellitus without complication ([place])   . Hypertension   . Renal disorder    Patient Active Problem List   Diagnosis Date Noted  . ICH (intracerebral hemorrhage) ([place]) 08/24/2022  . ESRD (end stage renal disease) ([name]) 12/15/2021  . Essential hypertension 12/15/2021  . Diabetes mellitus ([place]) 12/15/2021  . CKD (chronic kidney disease), stage IV ([place]) 12/08/2021  . AKI (acute kidney injury) ([place]) 12/08/2021  . 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 Microcytic anemia 08/06/2019   Home Medication(s) Prior to Admission medications   Medication Sig Start Date End Date Taking? Authorizing Provider  acetaminophen (TYLENOL) 325 MG tablet Take 2 tablets (650 mg total) by mouth every 6 (six) hours as needed for moderate pain. 05/19/22   Jaynee Eagles, PA-C  amLODipine (NORVASC) 10 MG tablet Take 1 tablet (10 mg total) by mouth at bedtime. Patient not taking: Reported on 03/31/2022 12/15/21   Elgergawy, Silver Huguenin, MD  amLODipine (NORVASC) 10 MG tablet Take 10 mg by  mouth at bedtime. 04/23/22   [provider]  calcitRIOL (ROCALTROL) 0.25 MCG capsule Take 1 capsule (0.25 mcg total) by mouth daily. 12/16/21   Elgergawy, Silver Huguenin, MD  calcium acetate (PHOSLO) 667 MG capsule Take 2 capsules (1,334 mg total) by mouth 3 (three) times daily with meals. 12/15/21   Elgergawy, Silver Huguenin, MD  calcium acetate (PHOSLO) 667 MG capsule Take 1,334 mg by mouth 3 (three) times daily. 03/31/22   [provider]  carvedilol (COREG) 12.5 MG tablet Take 12.5 mg by mouth daily.    [provider]  carvedilol (COREG) 25 MG tablet Take 25 mg by mouth 2 (two) times daily. 05/31/22   [provider]  carvedilol (COREG) 6.25 MG tablet Take 1 tablet (6.25 mg total) by mouth 2 (two) times daily with a meal. Patient not taking: Reported on 03/31/2022 12/15/21   Elgergawy, Silver Huguenin, MD  Emollient (CETAPHIL) cream Apply 1 application  topically daily as needed (itching).    [provider]  hydrALAZINE (APRESOLINE) 50 MG tablet Take 1 tablet (50 mg total) by mouth 3 (three) times daily. Patient not taking: Reported on 03/31/2022 12/15/21   Elgergawy, Silver Huguenin, MD  Insulin Pen Needle 32G X 4 MM MISC Use to inject insulin daily as directed. 12/15/21   Elgergawy, Silver Huguenin, MD  isosorbide mononitrate (IMDUR) 30 MG 24 hr tablet Take 1 tablet (30 mg total) by mouth daily. 12/16/21   Elgergawy, Silver Huguenin, MD  isosorbide mononitrate (IMDUR) 30 MG 24 hr tablet Take 30 mg by mouth at bedtime. 04/23/22  [provider]  losartan (COZAAR) 100 MG tablet Take 100 mg by mouth daily. 03/15/22   [provider]  methocarbamol (ROBAXIN) 500 MG tablet Take 1 tablet (500 mg total) by mouth 2 (two) times daily. 05/19/22   Jaynee Eagles, PA-C  multivitamin (RENA-VIT) TABS tablet Take 1 tablet by mouth at bedtime. 12/15/21   Elgergawy, Silver Huguenin, MD  SEMGLEE, YFGN, 100 UNIT/ML Pen Inject 10 Units into the skin daily. 01/20/22   [provider]  SEMGLEE, YFGN, 100 UNIT/ML  Pen SMARTSIG:10 Unit(s) SUB-Q Daily 04/23/22   [provider]  tetrahydrozoline-zinc (VISINE-AC) 0.05-0.25 % ophthalmic solution Place 1 drop into both eyes 3 (three) times daily as needed (dry eyes).    [provider]  tiZANidine (ZANAFLEX) 4 MG tablet Take 4 mg by mouth 3 (three) times daily as needed for muscle spasms.    [provider]                                                                                                                                    Allergies Gabapentin, Metformin and related, Gabapentin, and Metformin and related  Review of Systems Review of Systems As noted in HPI  Physical Exam Vital Signs  I have reviewed the triage vital signs BP (!) 241/150   Pulse 70   Temp 98.3 F (36.8 C)   Resp 15   Wt 80.4 kg   SpO2 100%   BMI 29.50 kg/m   Physical Exam Vitals reviewed.  Constitutional:      General: He is not in acute distress.    Appearance: He is well-developed. He is diaphoretic.  HENT:     Head: Normocephalic and atraumatic.     Nose: Nose normal.  Eyes:     General: No scleral icterus.       Right eye: No discharge.        Left eye: No discharge.     Conjunctiva/sclera: Conjunctivae normal.     Pupils: Pupils are equal, round, and reactive to light.  Cardiovascular:     Rate and Rhythm: Normal rate and regular rhythm.     Heart sounds: No murmur heard.    No friction rub. No gallop.     Arteriovenous access: Left arteriovenous access is present. Pulmonary:     Effort: Pulmonary effort is normal. No respiratory distress.     Breath sounds: Normal breath sounds. No stridor. No rales.  Abdominal:     General: There is no distension.     Palpations: Abdomen is soft.     Tenderness: There is no abdominal tenderness.  Musculoskeletal:        General: No tenderness.     Cervical back: Normal range of motion and neck supple.  Skin:    General: Skin is warm.     Findings: No erythema or rash.   Neurological:     Mental Status: He is alert  and oriented to person, place, and time.     Comments: Leftward gaze preference. Moves all extremities. No pronation or drift Sensation intact Detailed exam by neurology    ED Results and Treatments Labs (all labs ordered are listed, but only abnormal results are displayed) Labs Reviewed  CBC - Abnormal; Notable for the following components:      Result Value   Hemoglobin 9.2 (*)    HCT 29.5 (*)    MCV 66.1 (*)    MCH 20.6 (*)    RDW 19.9 (*)    All other components within normal limits  DIFFERENTIAL - Abnormal; Notable for the following components:   Monocytes Absolute 1.2 (*)    All other components within normal limits  I-STAT CHEM 8, ED - Abnormal; Notable for the following components:   Chloride 96 (*)    BUN 40 (*)    Creatinine, Ser 12.10 (*)    Glucose, Bld 155 (*)    Calcium, Ion 1.09 (*)    Hemoglobin 10.5 (*)    HCT 31.0 (*)    All other components within normal limits  CBG MONITORING, ED - Abnormal; Notable for the following components:   Glucose-Capillary 162 (*)    All other components within normal limits  PROTIME-INR  APTT  COMPREHENSIVE METABOLIC PANEL  ETHANOL                                                                                                                         EKG  EKG Interpretation  Date/Time:    Ventricular Rate:    PR Interval:    QRS Duration:   QT Interval:    QTC Calculation:   R Axis:     Text Interpretation:         Radiology CT HEAD CODE STROKE WO CONTRAST  Result Date: 08/24/2022 CLINICAL DATA:  Code stroke.  Acute neurologic deficits EXAM: CT HEAD WITHOUT CONTRAST TECHNIQUE: Contiguous axial images were obtained from the base of the skull through the vertex without intravenous contrast. RADIATION DOSE REDUCTION: This exam was performed according to the departmental dose-optimization program which includes automated exposure control, adjustment of the mA and/or kV  according to patient size and/or use of iterative reconstruction technique. COMPARISON:  None Available. FINDINGS: Brain: There is an acute intraparenchymal hematoma centered in the right cerebellar hemisphere measuring 2.6 x 1.8 cm with a craniocaudal dimension of 2.2 cm (volume equals 5.1 mL). There is severe mass effect on the fourth ventricle and cerebral aqueduct. No hydrocephalus. There is periventricular hypoattenuation compatible with chronic microvascular disease. Vascular: No abnormal hyperdensity of the major intracranial arteries or dural venous sinuses. No intracranial atherosclerosis. Skull: The visualized skull base, calvarium and extracranial soft tissues are normal. Sinuses/Orbits: No fluid levels or advanced mucosal thickening of the visualized paranasal sinuses. No mastoid or middle ear effusion. The orbits are normal. IMPRESSION: Acute intraparenchymal hematoma centered in the right cerebellar hemisphere with severe mass effect on the fourth ventricle and  cerebral aqueduct. No hydrocephalus. Electronically Signed   By: Ulyses Jarred M.D.   On: 08/24/2022 23:42    Medications Ordered in ED Medications  sodium chloride flush (NS) 0.9 % injection 3 mL (has no administration in time range)  labetalol (NORMODYNE) injection 10 mg (has no administration in time range)    And  clevidipine (CLEVIPREX) infusion 0.5 mg/mL (has no administration in time range)  etomidate (AMIDATE) injection (30 mg Intravenous Given 08/24/22 2346)  rocuronium (ZEMURON) injection (100 mg Intravenous Given 08/24/22 2347)  sugammadex sodium (BRIDION) injection 1,290 mg (has no administration in time range)                                                                                                                                     Procedures Procedure Name: Intubation Date/Time: 08/24/2022 11:57 PM  Performed by: Fatima Blank, MDPre-anesthesia Checklist: Patient identified, Patient being  monitored, Emergency Drugs available, Timeout performed and Suction available Oxygen Delivery Method: Ambu bag Preoxygenation: Pre-oxygenation with 100% oxygen Induction Type: Rapid sequence Ventilation: Mask ventilation without difficulty Laryngoscope Size: Glidescope Grade View: Grade I Tube size: 7.5 mm Number of attempts: 1 Airway Equipment and Method: Rigid stylet Placement Confirmation: ETT inserted through vocal cords under direct vision, CO2 detector and Breath sounds checked- equal and bilateral Secured at: 23 cm Tube secured with: ETT holder    .1-3 Lead EKG Interpretation  Performed by: Fatima Blank, MD Authorized by: Fatima Blank, MD     Interpretation: normal     ECG rate:  67   ECG rate assessment: normal     Rhythm: sinus rhythm     Ectopy: none     Conduction: normal   .Critical Care  Performed by: Fatima Blank, MD Authorized by: Fatima Blank, MD   Critical care provider statement:    Critical care time (minutes):  30   Critical care was necessary to treat or prevent imminent or life-threatening deterioration of the following conditions:  CNS failure or compromise and circulatory failure   Critical care was time spent personally by me on the following activities:  Development of treatment plan with patient or surrogate, discussions with consultants, evaluation of patient's response to treatment, examination of patient, ordering and review of laboratory studies, ordering and review of radiographic studies, ordering and performing treatments and interventions, pulse oximetry, re-evaluation of patient's condition and review of old charts   Care discussed with: admitting provider     (including critical care time)  Medical Decision Making / ED Course   Medical Decision Making Amount and/or Complexity of Data Reviewed Labs: ordered. Decision-making details documented in ED Course. Radiology: ordered and independent  interpretation performed. Decision-making details documented in ED Course.  Risk Prescription drug management. Decision regarding hospitalization.          Final Clinical Impression(s) / ED Diagnoses Final diagnoses:  None    {Document critical  care time when appropriate:1}  {Document review of labs and clinical decision tools ie heart score, Chads2Vasc2 etc:1}  {Document your independent review of radiology images, and any outside records:1} {Document your discussion with family members, caretakers, and with consultants:1} {Document social determinants of health affecting pt's care:1} {Document your decision making why or why not admission, treatments were needed:1} This chart was dictated using voice recognition software.  Despite best efforts to proofread,  errors can occur which can change the documentation meaning.

## 2022-08-25 ENCOUNTER — Inpatient Hospital Stay (HOSPITAL_COMMUNITY): Payer: No Typology Code available for payment source

## 2022-08-25 ENCOUNTER — Encounter (HOSPITAL_COMMUNITY): Payer: Self-pay | Admitting: Neurology

## 2022-08-25 DIAGNOSIS — J9601 Acute respiratory failure with hypoxia: Secondary | ICD-10-CM

## 2022-08-25 DIAGNOSIS — I161 Hypertensive emergency: Secondary | ICD-10-CM

## 2022-08-25 DIAGNOSIS — I160 Hypertensive urgency: Secondary | ICD-10-CM

## 2022-08-25 DIAGNOSIS — I614 Nontraumatic intracerebral hemorrhage in cerebellum: Secondary | ICD-10-CM

## 2022-08-25 DIAGNOSIS — I6389 Other cerebral infarction: Secondary | ICD-10-CM

## 2022-08-25 DIAGNOSIS — I619 Nontraumatic intracerebral hemorrhage, unspecified: Principal | ICD-10-CM

## 2022-08-25 DIAGNOSIS — N179 Acute kidney failure, unspecified: Secondary | ICD-10-CM

## 2022-08-25 DIAGNOSIS — Z992 Dependence on renal dialysis: Secondary | ICD-10-CM

## 2022-08-25 LAB — CBC
HCT: 24.4 % — ABNORMAL LOW (ref 39.0–52.0)
Hemoglobin: 7.7 g/dL — ABNORMAL LOW (ref 13.0–17.0)
MCH: 20.8 pg — ABNORMAL LOW (ref 26.0–34.0)
MCHC: 31.6 g/dL (ref 30.0–36.0)
MCV: 65.8 fL — ABNORMAL LOW (ref 80.0–100.0)
Platelets: 230 10*3/uL (ref 150–400)
RBC: 3.71 MIL/uL — ABNORMAL LOW (ref 4.22–5.81)
RDW: 19.7 % — ABNORMAL HIGH (ref 11.5–15.5)
WBC: 9 10*3/uL (ref 4.0–10.5)
nRBC: 0 % (ref 0.0–0.2)

## 2022-08-25 LAB — GLUCOSE, CAPILLARY
Glucose-Capillary: 103 mg/dL — ABNORMAL HIGH (ref 70–99)
Glucose-Capillary: 127 mg/dL — ABNORMAL HIGH (ref 70–99)
Glucose-Capillary: 138 mg/dL — ABNORMAL HIGH (ref 70–99)
Glucose-Capillary: 177 mg/dL — ABNORMAL HIGH (ref 70–99)
Glucose-Capillary: 67 mg/dL — ABNORMAL LOW (ref 70–99)
Glucose-Capillary: 69 mg/dL — ABNORMAL LOW (ref 70–99)
Glucose-Capillary: 96 mg/dL (ref 70–99)

## 2022-08-25 LAB — LACTIC ACID, PLASMA
Lactic Acid, Venous: 1 mmol/L (ref 0.5–1.9)
Lactic Acid, Venous: 0.9 mmol/L (ref 0.5–1.9)

## 2022-08-25 LAB — COMPREHENSIVE METABOLIC PANEL
ALT: 17 U/L (ref 0–44)
AST: 15 U/L (ref 15–41)
Albumin: 3.4 g/dL — ABNORMAL LOW (ref 3.5–5.0)
Alkaline Phosphatase: 67 U/L (ref 38–126)
Anion gap: 19 — ABNORMAL HIGH (ref 5–15)
BUN: 44 mg/dL — ABNORMAL HIGH (ref 6–20)
CO2: 27 mmol/L (ref 22–32)
Calcium: 8.9 mg/dL (ref 8.9–10.3)
Chloride: 92 mmol/L — ABNORMAL LOW (ref 98–111)
Creatinine, Ser: 11.1 mg/dL — ABNORMAL HIGH (ref 0.61–1.24)
GFR, Estimated: 5 mL/min — ABNORMAL LOW (ref >60)
Glucose, Bld: 154 mg/dL — ABNORMAL HIGH (ref 70–99)
Potassium: 4.6 mmol/L (ref 3.5–5.1)
Sodium: 138 mmol/L (ref 135–145)
Total Bilirubin: 0.6 mg/dL (ref 0.3–1.2)
Total Protein: 7 g/dL (ref 6.5–8.1)

## 2022-08-25 LAB — BASIC METABOLIC PANEL
Anion gap: 18 — ABNORMAL HIGH (ref 5–15)
BUN: 46 mg/dL — ABNORMAL HIGH (ref 6–20)
CO2: 27 mmol/L (ref 22–32)
Calcium: 8.3 mg/dL — ABNORMAL LOW (ref 8.9–10.3)
Chloride: 98 mmol/L (ref 98–111)
Creatinine, Ser: 11.68 mg/dL — ABNORMAL HIGH (ref 0.61–1.24)
GFR, Estimated: 5 mL/min — ABNORMAL LOW (ref >60)
Glucose, Bld: 89 mg/dL (ref 70–99)
Potassium: 4.9 mmol/L (ref 3.5–5.1)
Sodium: 143 mmol/L (ref 135–145)

## 2022-08-25 LAB — RENAL FUNCTION PANEL
Albumin: 2.7 g/dL — ABNORMAL LOW (ref 3.5–5.0)
Anion gap: 13 (ref 5–15)
BUN: 43 mg/dL — ABNORMAL HIGH (ref 6–20)
CO2: 26 mmol/L (ref 22–32)
Calcium: 7.9 mg/dL — ABNORMAL LOW (ref 8.9–10.3)
Chloride: 107 mmol/L (ref 98–111)
Creatinine, Ser: 10.69 mg/dL — ABNORMAL HIGH (ref 0.61–1.24)
GFR, Estimated: 5 mL/min — ABNORMAL LOW (ref >60)
Glucose, Bld: 114 mg/dL — ABNORMAL HIGH (ref 70–99)
Phosphorus: 5.3 mg/dL — ABNORMAL HIGH (ref 2.5–4.6)
Potassium: 4.2 mmol/L (ref 3.5–5.1)
Sodium: 146 mmol/L — ABNORMAL HIGH (ref 135–145)

## 2022-08-25 LAB — MRSA NEXT GEN BY PCR, NASAL: MRSA by PCR Next Gen: NOT DETECTED

## 2022-08-25 LAB — ECHOCARDIOGRAM COMPLETE
AR max vel: 3.67 cm2
AV Area VTI: 3.76 cm2
AV Area mean vel: 3.39 cm2
AV Mean grad: 2 mmHg
AV Peak grad: 3.7 mmHg
Ao pk vel: 0.96 m/s
Area-P 1/2: 2.48 cm2
Height: 65 in
Weight: 2836 oz

## 2022-08-25 LAB — ETHANOL: Alcohol, Ethyl (B): <10 mg/dL (ref <10)

## 2022-08-25 LAB — PHOSPHORUS
Phosphorus: 6.8 mg/dL — ABNORMAL HIGH (ref 2.5–4.6)
Phosphorus: 5.3 mg/dL — ABNORMAL HIGH (ref 2.5–4.6)

## 2022-08-25 LAB — POCT I-STAT 7, (LYTES, BLD GAS, ICA,H+H)
Acid-Base Excess: 7 mmol/L — ABNORMAL HIGH (ref 0.0–2.0)
Bicarbonate: 31.6 mmol/L — ABNORMAL HIGH (ref 20.0–28.0)
Calcium, Ion: 1.05 mmol/L — ABNORMAL LOW (ref 1.15–1.40)
HCT: 26 % — ABNORMAL LOW (ref 39.0–52.0)
Hemoglobin: 8.8 g/dL — ABNORMAL LOW (ref 13.0–17.0)
O2 Saturation: 96 %
Patient temperature: 36.4
Potassium: 5 mmol/L (ref 3.5–5.1)
Sodium: 140 mmol/L (ref 135–145)
TCO2: 33 mmol/L — ABNORMAL HIGH (ref 22–32)
pCO2 arterial: 42.3 mmHg (ref 32–48)
pH, Arterial: 7.48 — ABNORMAL HIGH (ref 7.35–7.45)
pO2, Arterial: 76 mmHg — ABNORMAL LOW (ref 83–108)

## 2022-08-25 LAB — SODIUM
Sodium: 146 mmol/L — ABNORMAL HIGH (ref 135–145)
Sodium: 147 mmol/L — ABNORMAL HIGH (ref 135–145)

## 2022-08-25 LAB — MAGNESIUM
Magnesium: 2.1 mg/dL (ref 1.7–2.4)
Magnesium: 2.3 mg/dL (ref 1.7–2.4)

## 2022-08-25 MED ORDER — PEPTAMEN 1.5 CAL PO LIQD
1000.0000 mL | ORAL | Status: DC
  Administered 2022-08-25 – 2022-08-30 (×6): 1000 mL
  Filled 2022-08-25 (×9): qty 1000

## 2022-08-25 MED ORDER — DEXTROSE 50 % IV SOLN
INTRAVENOUS | Status: AC
  Administered 2022-08-25: 12.5 g via INTRAVENOUS
  Filled 2022-08-25: qty 50

## 2022-08-25 MED ORDER — INSULIN ASPART 100 UNIT/ML IJ SOLN
0.0000 [IU] | INTRAMUSCULAR | Status: DC
  Administered 2022-08-26: 2 [IU] via SUBCUTANEOUS
  Administered 2022-08-26: 1 [IU] via SUBCUTANEOUS
  Administered 2022-08-26 (×2): 2 [IU] via SUBCUTANEOUS
  Administered 2022-08-26: 1 [IU] via SUBCUTANEOUS
  Administered 2022-08-27 (×3): 2 [IU] via SUBCUTANEOUS

## 2022-08-25 MED ORDER — HEPARIN SODIUM (PORCINE) 1000 UNIT/ML DIALYSIS
1000.0000 [IU] | INTRAMUSCULAR | Status: DC | PRN
  Administered 2022-08-25 – 2022-08-27 (×3): 2800 [IU] via INTRAVENOUS_CENTRAL
  Filled 2022-08-25 (×2): qty 6
  Filled 2022-08-25: qty 3
  Filled 2022-08-25 (×2): qty 6

## 2022-08-25 MED ORDER — ALTEPLASE 2 MG IJ SOLR: 2.0000 mg | Freq: Once | INTRAMUSCULAR | Status: DC | PRN

## 2022-08-25 MED ORDER — PROSOURCE TF20 ENFIT COMPATIBL EN LIQD
60.0000 mL | Freq: Three times a day (TID) | ENTERAL | Status: DC
  Administered 2022-08-25 – 2022-09-01 (×20): 60 mL
  Filled 2022-08-25 (×20): qty 60

## 2022-08-25 MED ORDER — SODIUM CHLORIDE 0.9 % FOR CRRT: INTRAVENOUS_CENTRAL | Status: DC | PRN

## 2022-08-25 MED ORDER — PROPOFOL 1000 MG/100ML IV EMUL: 0.0000 ug/kg/min | INTRAVENOUS | Status: DC

## 2022-08-25 MED ORDER — FENTANYL CITRATE PF 50 MCG/ML IJ SOSY
PREFILLED_SYRINGE | INTRAMUSCULAR | Status: AC
  Filled 2022-08-25: qty 2

## 2022-08-25 MED ORDER — INSULIN ASPART 100 UNIT/ML IJ SOLN: 0.0000 [IU] | INTRAMUSCULAR | Status: DC

## 2022-08-25 MED ORDER — DEXTROSE 50 % IV SOLN
12.5000 g | INTRAVENOUS | Status: AC
  Administered 2022-08-25: 12.5 g via INTRAVENOUS

## 2022-08-25 MED ORDER — MIDAZOLAM HCL (PF) 5 MG/ML IJ SOLN
INTRAMUSCULAR | Status: AC
  Filled 2022-08-25: qty 1

## 2022-08-25 MED ORDER — CHLORHEXIDINE GLUCONATE CLOTH 2 % EX PADS
6.0000 | MEDICATED_PAD | Freq: Every day | CUTANEOUS | Status: DC
  Administered 2022-08-26 – 2022-09-07 (×12): 6 via TOPICAL

## 2022-08-25 MED ORDER — RENA-VITE PO TABS
1.0000 | ORAL_TABLET | Freq: Every day | ORAL | Status: DC
  Administered 2022-08-25 – 2022-09-22 (×26): 1
  Filled 2022-08-25 (×27): qty 1

## 2022-08-25 MED ORDER — SODIUM CHLORIDE 0.9 % IV SOLN: INTRAVENOUS | Status: DC | PRN

## 2022-08-25 MED ORDER — FENTANYL 2500MCG IN NS 250ML (10MCG/ML) PREMIX INFUSION
0.0000 ug/h | INTRAVENOUS | Status: DC
  Administered 2022-08-25: 300 ug/h via INTRAVENOUS
  Administered 2022-08-25: 325 ug/h via INTRAVENOUS
  Administered 2022-08-25: 100 ug/h via INTRAVENOUS
  Administered 2022-08-26: 150 ug/h via INTRAVENOUS
  Administered 2022-08-26: 250 ug/h via INTRAVENOUS
  Administered 2022-08-27: 125 ug/h via INTRAVENOUS
  Administered 2022-08-28: 100 ug/h via INTRAVENOUS
  Administered 2022-08-28: 150 ug/h via INTRAVENOUS
  Administered 2022-08-30: 50 ug/h via INTRAVENOUS
  Administered 2022-08-31: 75 ug/h via INTRAVENOUS
  Administered 2022-09-01 (×2): 50 ug/h via INTRAVENOUS
  Administered 2022-09-02: 100 ug/h via INTRAVENOUS
  Administered 2022-09-03: 200 ug/h via INTRAVENOUS
  Administered 2022-09-03: 100 ug/h via INTRAVENOUS
  Administered 2022-09-04: 300 ug/h via INTRAVENOUS
  Administered 2022-09-04: 350 ug/h via INTRAVENOUS
  Administered 2022-09-04 – 2022-09-06 (×5): 300 ug/h via INTRAVENOUS
  Administered 2022-09-06: 150 ug/h via INTRAVENOUS
  Administered 2022-09-07: 75 ug/h via INTRAVENOUS
  Administered 2022-09-08: 200 ug/h via INTRAVENOUS
  Filled 2022-08-25 (×24): qty 250

## 2022-08-25 MED ORDER — ORAL CARE MOUTH RINSE: 15.0000 mL | OROMUCOSAL | Status: DC | PRN

## 2022-08-25 MED ORDER — PRISMASOL BGK 4/2.5 32-4-2.5 MEQ/L EC SOLN
Status: DC
  Filled 2022-08-25 (×18): qty 5000

## 2022-08-25 MED ORDER — PROPOFOL 1000 MG/100ML IV EMUL
5.0000 ug/kg/min | INTRAVENOUS | Status: DC
  Administered 2022-08-25: 60 ug/kg/min via INTRAVENOUS
  Administered 2022-08-25: 55 ug/kg/min via INTRAVENOUS
  Administered 2022-08-25: 60 ug/kg/min via INTRAVENOUS
  Administered 2022-08-25: 50 ug/kg/min via INTRAVENOUS
  Administered 2022-08-25: 60 ug/kg/min via INTRAVENOUS
  Administered 2022-08-25: 70 ug/kg/min via INTRAVENOUS
  Administered 2022-08-26: 50 ug/kg/min via INTRAVENOUS
  Administered 2022-08-26: 60 ug/kg/min via INTRAVENOUS
  Administered 2022-08-26: 50 ug/kg/min via INTRAVENOUS
  Administered 2022-08-26: 20 ug/kg/min via INTRAVENOUS
  Administered 2022-08-26: 30 ug/kg/min via INTRAVENOUS
  Administered 2022-08-27: 5 ug/kg/min via INTRAVENOUS
  Administered 2022-08-27 – 2022-08-28 (×2): 30 ug/kg/min via INTRAVENOUS
  Administered 2022-08-28 (×2): 40 ug/kg/min via INTRAVENOUS
  Administered 2022-08-28: 30 ug/kg/min via INTRAVENOUS
  Administered 2022-08-29: 15 ug/kg/min via INTRAVENOUS
  Filled 2022-08-25 (×18): qty 100

## 2022-08-25 MED ORDER — MIDAZOLAM HCL 2 MG/2ML IJ SOLN
1.0000 mg | INTRAMUSCULAR | Status: DC | PRN
  Administered 2022-09-02 – 2022-09-09 (×14): 2 mg via INTRAVENOUS
  Filled 2022-08-25 (×14): qty 2

## 2022-08-25 MED ORDER — POLYETHYLENE GLYCOL 3350 17 G PO PACK
17.0000 g | PACK | Freq: Every day | ORAL | Status: DC
  Administered 2022-08-26 – 2022-08-28 (×3): 17 g
  Filled 2022-08-25 (×3): qty 1

## 2022-08-25 MED ORDER — DOCUSATE SODIUM 50 MG/5ML PO LIQD
100.0000 mg | Freq: Two times a day (BID) | ORAL | Status: DC
  Administered 2022-08-25 – 2022-08-28 (×6): 100 mg
  Filled 2022-08-25 (×8): qty 10

## 2022-08-25 MED ORDER — PRISMASOL BGK 4/2.5 32-4-2.5 MEQ/L REPLACEMENT SOLN
Status: DC
  Filled 2022-08-25 (×5): qty 5000

## 2022-08-25 MED ORDER — SODIUM CHLORIDE 3 % IV BOLUS
250.0000 mL | Freq: Once | INTRAVENOUS | Status: AC
  Administered 2022-08-25: 250 mL via INTRAVENOUS
  Filled 2022-08-25: qty 500

## 2022-08-25 MED ORDER — IOHEXOL 350 MG/ML SOLN
75.0000 mL | Freq: Once | INTRAVENOUS | Status: AC | PRN
  Administered 2022-08-25: 75 mL via INTRAVENOUS

## 2022-08-25 MED ORDER — HEPARIN SODIUM (PORCINE) 1000 UNIT/ML DIALYSIS: 1000.0000 [IU] | INTRAMUSCULAR | Status: DC | PRN

## 2022-08-25 MED ORDER — PANTOPRAZOLE SODIUM 40 MG IV SOLR
40.0000 mg | Freq: Every day | INTRAVENOUS | Status: DC
  Administered 2022-08-25 – 2022-09-09 (×16): 40 mg via INTRAVENOUS
  Filled 2022-08-25 (×16): qty 10

## 2022-08-25 MED ORDER — DEXTROSE 50 % IV SOLN: 12.5000 g | INTRAVENOUS | Status: AC

## 2022-08-25 MED ORDER — ORAL CARE MOUTH RINSE
15.0000 mL | OROMUCOSAL | Status: DC
  Administered 2022-08-25: 15 mL via OROMUCOSAL

## 2022-08-25 MED ORDER — ORAL CARE MOUTH RINSE
15.0000 mL | OROMUCOSAL | Status: DC
  Administered 2022-08-25 – 2022-09-09 (×187): 15 mL via OROMUCOSAL

## 2022-08-25 MED ORDER — SODIUM CHLORIDE 3 % IV SOLN
INTRAVENOUS | Status: DC
  Filled 2022-08-25 (×7): qty 500

## 2022-08-25 MED ORDER — PRISMASOL BGK 4/2.5 32-4-2.5 MEQ/L REPLACEMENT SOLN
Status: DC
  Filled 2022-08-25 (×4): qty 5000

## 2022-08-25 MED ORDER — SODIUM CHLORIDE 23.4 % INJECTION (4 MEQ/ML) FOR IV ADMINISTRATION
120.0000 meq | Freq: Once | INTRAVENOUS | Status: AC
  Administered 2022-08-26: 120 meq via INTRAVENOUS
  Filled 2022-08-25: qty 30

## 2022-08-25 NOTE — Progress Notes (Addendum)
Notified by nursing about new slight jerking movements of the head.  On my examination patient is sedated, ventilated, head turned to the right, receiving CVVRT  Paused propofol and fentanyl for 5 minutes  Pupils are equal round reactive to light Weak corneal on the right, absent corneal on the left No VOR, but did cough after several attempts to elicit VOR  Complex movement of the bilateral lower extremities to noxious stimulation.  Withdraws right upper extremity, but minimal movement possibly trace extension of the left upper extremity  Did also observe some slight jerking movements of the head is noticed by nursing as well as some shivering like movements generally  New asymmetry is concerning for potential worsening of his cerebellar hemorrhage with mass effect on the brainstem and he is at risk of hydrocephalus  Plan: Stat head CT to rule out worsening intracranial process Last sodium was 147 at 1854, will give an additional 23.4% dose at this time  Lesleigh Noe MD-PhD [facility] [phone]   Addition of 15 minutes of care provided, billing per Dr. Erlinda Hong earlier today  Addendum:  Head CT personally reviewed, agree with radiology: Unchanged size of intraparenchymal hematoma in the right cerebellum with mass effect on the fourth ventricle. No hydrocephalus.  Continue to monitor at this time

## 2022-08-25 NOTE — Progress Notes (Signed)
Pt transported from ED16 to 1T14 with no complications.

## 2022-08-25 NOTE — Progress Notes (Signed)
ETT tube advanced per MD from 25 to 81.

## 2022-08-25 NOTE — Progress Notes (Signed)
Pt transported from 4N26 to CT and back to 0F00 with no complications.

## 2022-08-25 NOTE — Progress Notes (Signed)
NAMESharbel Ross, MRN:  [id number], DOB:  [birth year], LOS: 1 ADMISSION DATE:  08/24/2022, CONSULTATION DATE:  08/25/2022 REFERRING MD:  [place] - Neuro CHIEF COMPLAINT:  AMS   History of Present Illness:  51 year old man who presented to [facility] ED 11/14 with left sided visual preference, left sided weakness, slurred speech. He was also profoundly hypertensive with SBP over 200s (highest documented is SBP 230). PMHx significant for HTN, T2DM, ESRD on HD (MWF).  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 He was intubated in the ED and neurosurgery was consulted for consideration of EVD.  He was admitted by neurology and PCCM was called in consultation for vent management.  Pertinent Medical History:   Past Medical History:  Diagnosis Date   Anemia    CKD (chronic kidney disease)    on wed 12/09/21   Diabetes mellitus without complication ([place])    type 2   Hypertension    Renal disorder    Significant Hospital Events: Including procedures, antibiotic start and stop dates in addition to other pertinent events   11/15 - Admitted overnight. 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  CTA Head/Neck without e/o vascular lesion at R cerebellar hemorrhage. HTS 3% initiated. Trialysis catheter placed L IJ for CRRT/HTS 23% (per Stroke team). CRRT initiated.    Interim History / Subjective:  Admitted overnight CT Head with R cerebellar IPH +mass effect HTS 3% started Goal Na 150-155 Escalate to HTS 23% Trialysis catheter placed L IJ CRRT initiated  Objective:  Blood pressure (!) 136/59, pulse 63, temperature (!) 97.4 F (36.3 C), temperature source Axillary, resp. rate 17, height '5\' 5"'$  (1.651 m), weight 80.4 kg, SpO2 100 %.    Vent Mode: PRVC FiO2 (%):  [50 %-100 %] 50  % Set Rate:  [16 bmp-21 bmp] 16 bmp Vt Set:  [490 mL] 490 mL PEEP:  [5 cmH20] 5 cmH20 Plateau Pressure:  [18 cmH20-22 cmH20] 22 cmH20   Intake/Output Summary (Last 24 hours) at 08/25/2022 1019 Last data filed at 08/25/2022 0600 Gross per 24 hour  Intake 285.26 ml  Output --  Net 285.26 ml   Filed Weights   08/24/22 2334  Weight: 80.4 kg   Physical Examination: General: Acutely ill-appearing middle-aged man in NAD. HEENT: [place]/AT, anicteric sclera, PERRL, moist mucous membranes. Neuro: Sedated. Does not respond to verbal, tactile or noxious stimuli. Withdraws to pain in all 4 extremities. Not following commands. Moves all 4 extremities spontaneously. +Corneal, +Cough, and +Gag  CV: RRR, no m/g/r. PULM: Breathing even and unlabored on vent (PEEP 5, FiO2 40%). Lung fields CTAB in upper fields, diminished at bases. GI: Soft, nontender, mildly distended. Normoactive bowel sounds. Extremities: No LE edema noted. Skin: Warm/dry, no rashes.  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 CTA head 11/14 > no e/o vascular lesion at R cerebellar hemorrhage  Assessment & Plan:   Acute IPH with severe mass effect/brain swelling - presumed hypertensive in nature - Stroke team primary, appreciate management - NSGY consulted, no role for surgical intervention due to relatively small size of hematoma - Goal SBP 130-150 - Cleviprex titrated to goal SBP - HTS 3% for mass  effect, escalated to HTS 23% - Goal Na 150-155 - PT/OT/SLP as able  Hypertensive emergency - hx of HTN, unsure of compliance with med regimen - Goal SBP 130-150 - Cleviprex for goal SBP - Hold home amlodipine, Coreg, hydral, Imdur, losartan  Acute hypoxic respiratory failure with inability to protect the airway - s/p intubation in ED - Continue full vent support (4-8cc/kg  IBW) - Wean FiO2 for O2 sat > 90% - Daily WUA/SBT - VAP bundle - Pulmonary hygiene - PAD protocol for sedation: Propofol and Fentanyl for goal RASS 0 to -1  Hx ESRD on HD MWF (last session Mon 11/14) - Nephrology consulted, appreciate recs - CRRT per Nephro in the setting of permissive hypernatremia - Trialysis catheter placed L IJ - Trend BMP - Replete electrolytes as indicated - Monitor I&Os  Hx DM - SSI - Hold home basal insulin for now  At risk for malnutrition - Cortrak placed today - TF per Nutrition  Best practice (evaluated daily):  Diet/type: NPO DVT prophylaxis: not indicated GI prophylaxis: PPI Lines: N/A Foley:  N/A Code Status:  full code Last date of multidisciplinary goals of care discussion: Per primary.  Critical care time: 39 minutes   The patient is critically ill with multiple organ system failure and requires high complexity decision making for assessment and support, frequent evaluation and titration of therapies, advanced monitoring, review of radiographic studies and interpretation of complex data.   Critical Care Time devoted to patient care services, exclusive of separately billable procedures, described in this note is 39 minutes.  Lestine Mount, PA-C [facility] 08/25/22 10:19 AM  Please see [email or web address] for pager details.  From 7A-7P if no response, please call [phone] After hours, please call ELink [phone]

## 2022-08-25 NOTE — Progress Notes (Signed)
Initial Nutrition Assessment  DOCUMENTATION CODES:   Not applicable  INTERVENTION:   Initiate tube feeds via Cortrak: - Peptamen 1.5 @ 55 ml/hr (1320 ml/day) - PROSource TF20 60 ml TID  Tube feeding regimen provides 2220 kcal, 150 grams of protein, and 1014 ml of H2O.   - Renal MVI daily per tube  NUTRITION DIAGNOSIS:   Inadequate oral intake related to inability to eat as evidenced by NPO status.  GOAL:   Patient will meet greater than or equal to 90% of their needs  MONITOR:   Vent status, Labs, Weight trends, TF tolerance, I & O's  REASON FOR ASSESSMENT:   Ventilator, Consult Enteral/tube feeding initiation and management  ASSESSMENT:   51 year old male who presented to the ED on 11/14 as a Code Stroke with AMS, L sided weakness, slurred speech, L sided gaze preference. PMH of ESRD on HD, DM, HTN. Pt required intubation in the ED. Pt admitted with [place].  11/15 - Cortrak placed (tip in gastric antrum or duodenal bulb), starting CRRT  Discussed pt with RN at bedside. Cortrak placed today for enteral access. Plan to start CRRT today to limit fluid shifts. Will order daily renal MVI.  Unable to obtain diet and weight history at this time. Reviewed weight history in chart. Pt's weight is down 3.2 kg since 02/02/22. This is a 3.8% weight loss which is not clinically significant for timeframe. Suspect pt's weight fluctuates secondary volume status/iHD.  EDW: 80 kg Admit weight: 80.4 kg  Patient is currently intubated on ventilator support MV: 7.7 L/min Temp (24hrs), Avg:98.6 F (37 C), Min:97.2 F (36.2 C), Max:99.9 F (37.7 C)  Drips: Propofol: 60 mcg/kg/min (provides 763 kcal daily from lipid) Fentanyl Cleviprex: 4 ml/hr 3% saline: 50 ml/hr  Medications reviewed and include: colace, SSI q 4 hours, IV protonix, miralax  Labs reviewed: ionized calcium 1.05, phosphorus 6.8, hemoglobin 7.7 CBG's: 67-162 x 24 hours  NUTRITION - FOCUSED PHYSICAL  EXAM:  Flowsheet Row Most Recent Value  Orbital Region No depletion  Upper Arm Region Mild depletion  Thoracic and Lumbar Region No depletion  Buccal Region Unable to assess  Temple Region No depletion  Clavicle Bone Region Mild depletion  Clavicle and Acromion Bone Region Mild depletion  Scapular Bone Region No depletion  Dorsal Hand No depletion  Patellar Region No depletion  Anterior Thigh Region No depletion  Posterior Calf Region No depletion  Edema (RD Assessment) None  Hair Reviewed  Eyes Reviewed  Mouth Reviewed  Skin Reviewed  Nails Reviewed       Diet Order:   Diet Order             Diet NPO time specified  Diet effective now                   EDUCATION NEEDS:   No education needs have been identified at this time  Skin:  Skin Assessment: Reviewed RN Assessment  Last BM:  no documented BM  Height:   Ht Readings from Last 1 Encounters:  08/25/22 '5\' 5"'$  (1.651 m)    Weight:   Wt Readings from Last 1 Encounters:  08/25/22 81.5 kg    Ideal Body Weight:  61.8 kg  BMI:  Body mass index is 29.9 kg/m.  Estimated Nutritional Needs:   Kcal:  2100-2300  Protein:  130-150 grams  Fluid:  1000 ml + UOP    Gustavus Bryant, MS, RD, LDN Inpatient Clinical Dietitian Please see AMiON for contact information.

## 2022-08-25 NOTE — Procedures (Signed)
Cortrak  Person Inserting Tube:  Maylon Peppers C, RD Tube Type:  Cortrak - 43 inches Tube Size:  10 Tube Location:  Left nare Secured by: Bridle Technique Used to Measure Tube Placement:  Marking at nare/corner of mouth Cortrak Secured At:  71 cm   Cortrak Tube Team Note:  Consult received to place a Cortrak feeding tube.   X-ray is required, abdominal x-ray has been ordered by the Cortrak team. Please confirm tube placement before using the Cortrak tube.   If the tube becomes dislodged please keep the tube and contact the Cortrak team at [email or web address] for replacement.  If after hours and replacement cannot be delayed, place a NG tube and confirm placement with an abdominal x-ray.    Lockie Pares., RD, LDN, CNSC See AMiON for contact information

## 2022-08-25 NOTE — Progress Notes (Signed)
RT transported pt from CT back to room 16 without event.

## 2022-08-25 NOTE — Progress Notes (Signed)
Attending:    Subjective: Admitted overnight for hemorrhagic stroke in cerebellum with mass effect.  Seen by NSGY, did not choose to operate.  Remains encephalopathic on mechanical ventilation.   Objective: Vitals:   08/25/22 1100 08/25/22 1110 08/25/22 1200 08/25/22 1300  BP:  (!) 154/59  (!) 160/67  Pulse: 63 62 62 65  Resp: '16 17 17 16  '$ Temp: 99.3 F (37.4 C) 99.3 F (37.4 C) 99.7 F (37.6 C) 99.9 F (37.7 C)  TempSrc:      SpO2: 100% 100% 100% 100%  Weight:    81.5 kg  Height:       Vent Mode: PRVC FiO2 (%):  [50 %-100 %] 50 % Set Rate:  [16 bmp-21 bmp] 16 bmp Vt Set:  [490 mL] 490 mL PEEP:  [5 cmH20] 5 cmH20 Plateau Pressure:  [18 cmH20-22 cmH20] 20 cmH20  Intake/Output Summary (Last 24 hours) at 08/25/2022 1415 Last data filed at 08/25/2022 1358 Gross per 24 hour  Intake 534.4 ml  Output --  Net 534.4 ml    General:  In bed on vent HENT: NCAT ETT in place PULM: CTA B, vent supported breathing CV: RRR, no mgr GI: BS+, soft, nontender MSK: normal bulk and tone Neuro: sedated on vent    CBC    Component Value Date/Time   WBC 9.0 08/25/2022 0440   RBC 3.71 (L) 08/25/2022 0440   HGB 7.7 (L) 08/25/2022 0440   HGB 10.5 (L) 08/09/2019 0827   HCT 24.4 (L) 08/25/2022 0440   PLT 230 08/25/2022 0440   PLT 396 08/09/2019 0827   MCV 65.8 (L) 08/25/2022 0440   MCH 20.8 (L) 08/25/2022 0440   MCHC 31.6 08/25/2022 0440   RDW 19.7 (H) 08/25/2022 0440   LYMPHSABS 1.6 08/24/2022 2330   MONOABS 1.2 (H) 08/24/2022 2330   EOSABS 0.2 08/24/2022 2330   BASOSABS 0.1 08/24/2022 2330    BMET    Component Value Date/Time   NA 146 (H) 08/25/2022 1246   K 4.9 08/25/2022 0440   CL 98 08/25/2022 0440   CO2 27 08/25/2022 0440   GLUCOSE 89 08/25/2022 0440   BUN 46 (H) 08/25/2022 0440   CREATININE 11.68 (H) 08/25/2022 0440   CREATININE 2.54 (H) 08/09/2019 0827   CALCIUM 8.3 (L) 08/25/2022 0440   GFRNONAA 5 (L) 08/25/2022 0440   GFRNONAA NOT CALCULATED 08/09/2019 0827    GFRAA NOT CALCULATED 08/09/2019 0827      Impression/Plan: Cerebellar IPH with mass effect> start 23% saline (given current volume status and ESRD state), repeat imaging per neurology/nsgy Acute respiratory failure with hypoxemia> continue full vent support. VAP prevention ESRD> start CRRT to minimize rapid fluid shifts with 23% hypertonic saline Nutrition> place cor trak, tube feeding today  Rest per PA note today  Discussed with nephrology and neurology  My cc time 23 minutes  Roselie Awkward, MD [facility] Pager: [phone] Cell: [phone] After 7pm: [phone]

## 2022-08-25 NOTE — H&P (Addendum)
Neurology Consultation Reason for Consult: Sudden onset weakness  Requesting Physician: Addison Lank  CC: sudden onset slurred speech, chest pain, left sided weakness   History is obtained from: EMS and chart review   HPI: Randy Ross is a 51 y.o. male with a past medical history significant for end-stage renal disease on intermittent hemodialysis, diabetes, hypertension  Family reports he was in his usual state of health today when he began to have witnessed onset slurred speech, with headache at 10:30 PM for which EMS was activated.  On their arrival they report blood pressure was 260/140, glucose 126  On arrival to the ED he was able to state his name, denied headache but reported significant chest pain, and was vomiting with depressed mental status for which he was intubated after head CT confirmed cerebellar hemorrhage.  He was managed initially with brief hyperventilation, propofol was started, due to significant ventilator discomfort with repeated coughing, after fentanyl and propofol pushes were unsuccessful, midazolam was given to facilitate safe transport and he became briefly hypotensive  He was most recently in the ED on 11/10 for volume overload and received hemodialysis prior to discharge  LKW:  Thrombolytic given?: No, ICH IA performed?: No, ICH Premorbid modified rankin scale:      0 - No symptoms.  ICH Score: 1  Time performed: 11:45 PM GCS: 13-15 is 0 points Infratentorial: Yes.  . If yes, 1 point  Volume: <30cc is 0 points (~5 cc) Age: 51 y.o.. >80 is 1 point -- 0  Intraventricular extension is 1 point -- 0 Score: 1 A Score of 1 points has a 30 day mortality of 13%. Stroke. 2001 Apr;32(4):891-7.  ROS: All other review of systems was negative except as noted in the HPI.   Past Medical History:  Diagnosis Date   Anemia    CKD (chronic kidney disease)    on wed 12/09/21   Diabetes mellitus without complication ([place])    type 2   Diabetes mellitus without  complication ([place])    Hypertension    Renal disorder    Past Surgical History:  Procedure Laterality Date   AV FISTULA PLACEMENT Left 12/10/2021   Procedure: LEFT RADIAL CEPHALIC  ARTERIOVENOUS (AV) FISTULA;  Surgeon: Waynetta Sandy, MD;  Location: [place];  Service: Vascular;  Laterality: Left;   IR FLUORO GUIDE CV LINE RIGHT  12/09/2021   IR US GUIDE VASC ACCESS RIGHT  12/09/2021   LUMBAR [place] SURGERY  2021   L5-S1     No family history on file.   Social History:  reports that he has been smoking cigarettes. He has been smoking an average of .15 packs per day. He has never used smokeless tobacco. He reports that he does not currently use alcohol. He reports that he does not currently use drugs.   Exam: Current vital signs: BP 137/74   Pulse 62   Temp 98.3 F (36.8 C)   Resp 19   Wt 80.4 kg   SpO2 100%   BMI 29.50 kg/m  Vital signs in last 24 hours: Temp:  [98.3 F (36.8 C)] 98.3 F (36.8 C) (11/14 2344) Pulse Rate:  [62-78] 62 (11/15 0044) Resp:  [15-25] 19 (11/15 0044) BP: (91-241)/(61-150) 137/74 (11/15 0048) SpO2:  [100 %] 100 % (11/15 0044) FiO2 (%):  [100 %] 100 % (11/15 0009) Weight:  [80.4 kg] 80.4 kg (11/14 2334)   Physical Exam  Constitutional: Acutely ill, drowsy, vomiting Psych: Cooperative Eyes: No scleral injection HENT: No oropharyngeal  obstruction.  MSK: no joint deformities.  Cardiovascular: Extremely hypertensive, regular rhythm and rate Respiratory: Tachypneic GI: Soft.  No distension. There is no tenderness.  Skin: Warm dry and intact visible skin  Neuro: Mental Status: Patient is drowsy but opens eyes to voice, oriented to person, place, month, year, and situation. Limited history due to intermittent emesis and chest pain Able to name and repeat simple but not complex sentences Cranial Nerves: II: Visual Fields are with the right hemianopia. Pupils are equal, round, and reactive to light.  III,IV, VI: Left gaze preference, tracks  to midline V: Facial sensation is symmetric to light touch VII: Facial movement is symmetric.  VIII: hearing is intact to voice XII: tongue is midline without atrophy or fasciculations.  Motor: On initial EMS evaluation he had some left-sided weakness but is symmetrically moves all 4 extremities on my evaluation without drift in any of the extremities Sensory: Reported intact sensation to light touch in all 4 extremities Cerebellar: No clear ataxia but poor participation in this part of the examination due to vomiting Gait:  Deferred   NIHSS total 5 Score breakdown: One-point for drowsiness, one-point for left gaze preference, 2 points for right visual field cut, one-point for dysarthria Performed at time of patient arrival to ED    I have reviewed labs in epic and the results pertinent to this consultation are:  Basic Metabolic Panel: Recent Labs  Lab 08/20/22 0318 08/24/22 2330  NA 140 138  138  K 4.7 4.6  4.5  CL 94* 92*  96*  CO2 30 27  GLUCOSE 116* 154*  155*  BUN 40* 44*  40*  CREATININE 10.63* 11.10*  12.10*  CALCIUM 8.3* 8.9     CBC: Recent Labs  Lab 08/20/22 0318 08/24/22 2330  WBC 11.7* 9.3  NEUTROABS 9.3* 6.3  HGB 8.7* 9.2*  10.5*  HCT 27.1* 29.5*  31.0*  MCV 65.9* 66.1*  PLT 309 280     Coagulation Studies: Recent Labs    08/24/22 2328-12-29  LABPROT 13.2  INR 1.0     I have reviewed the images obtained:  Head CT personally reviewed, agree with radiology Acute intraparenchymal hematoma centered in the right cerebellar hemisphere with severe mass effect on the fourth ventricle and cerebral aqueduct. No hydrocephalus.   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  Management complicated by ESRD  Recommendations:  Assessment:   Plan: Cerebellum ICH, nontraumatic  Acuity: Acute Laterality: Right cerebellum Current suspected  etiology: Hypertension    Treatment: -Admit to ICU -ICH Score: 1 -ICH Volume: 5 cc -BP control goal SYS 130-150 -NSGY Consult for hematoma evacuation/decompressive hemicraniotomy /hydrocephalus watch -PT/OT/ST  -neuromonitoring  CNS Cerebral edema Compression of brain -Hyperosmolar therapy, cannot use mannitol due to ESRD, -Starting with 250 cc bolus of 3% saline with 50 cc/h 3% subsequently due to current mass effect -Goal Na 140-150 given 138 currently -Close neuro monitoring  At high risk for obstructive hydrocephalus -Appreciate NSGY evaluation -Continue to monitor  Dysarthria Dysphagia following ICH  -NPO until cleared by speech -ST -Advance diet as tolerated -May need PEG  RESP Intubated for airway protection given location of bleed and repeated vomiting with poor mental status -vent management per ICU -wean when able  CV Hypertensive Emergency Hypertensive Urgency -Aggressive BP control, goal SB 130-150 -Appreciate arterial line by CCM for close monitoring given labile pressures -Titrate oral agents once extubated and cleared for p.o.  Chest pain -TTE -Appreciate CCM following -Heparin contraindicated secondary to [place]  GI/GU ESRD -Continue dialysis, would need CVVHD in the acute setting to reduce fluid shifts, next dialysis due 11/16 -renal consult  HEME Anemia in CKD -Monitor -transfuse for hgb < 7  ENDO Type 2 diabetes mellitus  -SSI -Start oral meds -goal HgbA1c < 7  Fluid/Electrolyte Disorders Hypochloremia Hyper Phosphorus  -Repeat labs -Trend -Per CCM/nephrology  ID Possible Aspiration PNA -CXR -NPO -Monitor  Nutrition E66.9 Obesity  E46 Protein-Calorie Malnutrition Mild Moderate Severe -diet consult  Prophylaxis DVT: SCDs GI: Pantoprazole Bowel: Senna  Dispo: IP Rehab SNF LTAC Home with 24 hour supervision  Diet: NPO until cleared by speech  Code Status: Full Code    THE FOLLOWING WERE PRESENT ON ADMISSION: CNS  -   Cerebral Edema,  Respiratory - Possible Aspiration Pneumonia,  Cardiovascular - Possible NSTEMI, Hypertensive Emergency  Renal -  ESRD,  Lesleigh Noe MD-PhD [facility] [phone] Available 7 PM to 7 AM, outside of these hours please call Neurologist on call as listed on Amion.   Total critical care time: 90 minutes   Critical care time was exclusive of separately billable procedures and treating other patients.   Critical care was necessary to treat or prevent imminent or life-threatening deterioration.   Critical care was time spent personally by me on the following activities: development of treatment plan with patient and/or surrogate as well as nursing, discussions with consultants/primary team, evaluation of patient's response to treatment, examination of patient, obtaining history from patient or surrogate, ordering and performing treatments and interventions, ordering and review of laboratory studies, ordering and review of radiographic studies, and re-evaluation of patient's condition as needed, as documented above.

## 2022-08-25 NOTE — Procedures (Signed)
Central Venous Catheter Insertion Procedure Note  Randy Ross  [id number]  10/04/1971  Date:08/25/22  Time:12:25 PM   Provider Performing:Mattelyn Imhoff Jerilynn Mages Ayesha Rumpf   Procedure: Insertion of Non-tunneled Central Venous Catheter(36556)with US guidance ([id number])    Indication(s): Medication administration and Hemodialysis  Consent: Risks of the procedure as well as the alternatives and risks of each were explained to the patient and/or caregiver.  Consent for the procedure was obtained and is signed in the bedside chart.  Anesthesia: Topical only with 1% lidocaine   Timeout: Verified patient identification, verified procedure, site/side was marked, verified correct patient position, special equipment/implants available, medications/allergies/relevant history reviewed, required imaging and test results available.  Sterile Technique: Maximal sterile technique including full sterile barrier drape, hand hygiene, sterile gown, sterile gloves, mask, hair covering, sterile ultrasound probe cover (if used).  Procedure Description: Area of catheter insertion was cleaned with chlorhexidine and draped in sterile fashion.   With real-time ultrasound guidance a HD catheter was placed into the left internal jugular vein.  Nonpulsatile blood flow and easy flushing noted in all ports.  The catheter was sutured in place and sterile dressing applied.    Complications/Tolerance None; patient tolerated the procedure well. Chest X-ray is ordered to verify placement for internal jugular or subclavian cannulation.  Chest x-ray is not ordered for femoral cannulation.  EBL Minimal  Specimen(s) None  Lestine Mount, [place] [facility] 08/25/22 12:26 PM  Please see [email or web address] for pager details.  From 7A-7P if no response, please call [phone] After hours, please call ELink [phone]

## 2022-08-25 NOTE — Consult Note (Signed)
NAMEClyde Ross, MRN:  [id number], DOB:  1971/05/02, LOS: 1 ADMISSION DATE:  08/24/2022, CONSULTATION DATE:  08/25/22 REFERRING MD:  [place] CHIEF COMPLAINT:  AMS   History of Present Illness:  Randy Ross is a 51 y.o. male who has a PMH as below including but not limited to ESRD on HD MWF (last session Monday 11/14), HTN. He presented to [facility] ED 11/14 with left sided visual preference, left sided weakness, slurred speech. He was also profoundly hypertensive with SBP over 200s (highest documented is SBP 230). 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 He was intubated in the ED and neurosurgery was consulted for consideration of EVD.  He was admitted by neurology and PCCM was called in consultation for vent management.  Pertinent  Medical History:  has MGUS (monoclonal gammopathy of unknown significance); Microcytic anemia; CKD (chronic kidney disease), stage IV ([place]); AKI (acute kidney injury) ([place]); ESRD (end stage renal disease) ([place]); Essential hypertension; Diabetes mellitus ([place]); and ICH (intracerebral hemorrhage) ([place]) on their problem list.  Significant Hospital Events: Including procedures, antibiotic start and stop dates in addition to other pertinent events   11/15 admit.  Interim History / Subjective:  Sedated, not responsive but coughing intermittently.  Objective:  Blood pressure (!) 230/124, pulse 78, temperature 98.3 F (36.8 C), resp. rate (!) 25, weight 80.4 kg, SpO2 100 %.    Vent Mode: PRVC FiO2 (%):  [100 %] 100 % Set Rate:  [21 bmp] 21 bmp Vt Set:  [490 mL] 490 mL PEEP:  [5 cmH20] 5 cmH20 Plateau Pressure:  [21 cmH20] 21 cmH20  No intake or output data in the 24 hours ending 08/25/22 0012 Filed Weights   08/24/22 2334  Weight: 80.4 kg    Examination: General: Young adult male, sedated, critically  ill. Neuro: Sedated, not responsive but coughing. HEENT: [place]/AT. Sclerae anicteric. ETT in place. Cardiovascular: RRR, no M/R/G.  Lungs: Respirations even and unlabored.  CTA bilaterally, No W/R/R. Abdomen: BS x 4, soft, NT/ND.  Musculoskeletal: No gross deformities, no edema.  Skin: Intact, warm, no rashes.  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 CTA head 11/14 >   Assessment & Plan:   Acute IPH with severe mass effect/brain swelling - presumed hypertensive in nature. - Neurology has consulted Neurosurgery. - Neurology managing. - Continue Cleviprex as below. - UDS if able to produce urine.  Hypertensive emergency - hx of HTN, unsure of compliance with med regimen. - Continue Cleviprex for goal SBP < 130-160 per neuro. - Hold home Amlodipine, Carvedilol, Hydralazine, Imdur, Losartan for now.  Acute hypoxic respiratory failure with inability to protect the airway - s/p intubation in ED. - Full vent support. - Wean as mental status allows. - Bronchial hygiene. - Follow CXR.  Hx ESRD on HD MWF (last session Mon 11/14). - Primary team to contact nephrology in AM 11/15. - Follow BMP.  Hx DM. - SSI. - Hold home Semglee.  Best practice (evaluated daily):  Diet/type: NPO DVT prophylaxis: not indicated GI prophylaxis: PPI Lines: N/A Foley:  N/A Code Status:  full code Last date of multidisciplinary goals of care discussion: Per primary.  Labs   CBC: Recent Labs  Lab 08/20/22 0318 08/24/22 2330  WBC 11.7* 9.3  NEUTROABS 9.3* 6.3  HGB 8.7* 9.2*  10.5*  HCT 27.1* 29.5*  31.0*  MCV 65.9* 66.1*  PLT 309 545    Basic Metabolic Panel: Recent Labs  Lab 08/20/22 0318 08/24/22 2330  NA 140 138  138  K 4.7 4.6  4.5  CL 94* 92*  96*  CO2 30 27  GLUCOSE 116* 154*  155*  BUN 40* 44*  40*  CREATININE 10.63*  11.10*  12.10*  CALCIUM 8.3* 8.9   GFR: Estimated Creatinine Clearance: 7.7 mL/min (A) (by C-G formula based on SCr of 11.1 mg/dL (H)). Recent Labs  Lab 08/20/22 0318 08/24/22 2330  WBC 11.7* 9.3    Liver Function Tests: Recent Labs  Lab 08/24/22 2330  AST 15  ALT 17  ALKPHOS 67  BILITOT 0.6  PROT 7.0  ALBUMIN 3.4*   No results for input(s): "LIPASE", "AMYLASE" in the last 168 hours. No results for input(s): "AMMONIA" in the last 168 hours.  ABG    Component Value Date/Time   TCO2 29 08/24/2022 2330     Coagulation Profile: Recent Labs  Lab 08/24/22 2330  INR 1.0    Cardiac Enzymes: No results for input(s): "CKTOTAL", "CKMB", "CKMBINDEX", "TROPONINI" in the last 168 hours.  HbA1C: Hgb A1c MFr Bld  Date/Time Value Ref Range Status  12/08/2021 08:20 PM 5.9 (H) 4.8 - 5.6 % Final    Comment:    (NOTE) Pre diabetes:          5.7%-6.4%  Diabetes:              >6.4%  Glycemic control for   <7.0% adults with diabetes     CBG: Recent Labs  Lab 08/24/22 2327  GLUCAP 162*    Review of Systems:   Unable to obtain as pt is encephalopathic.  Past Medical History:  He,  has a past medical history of Anemia, CKD (chronic kidney disease), Diabetes mellitus without complication ([place]), Diabetes mellitus without complication ([place]), Hypertension, and Renal disorder.   Surgical History:   Past Surgical History:  Procedure Laterality Date   AV FISTULA PLACEMENT Left 12/10/2021   Procedure: LEFT RADIAL CEPHALIC  ARTERIOVENOUS (AV) FISTULA;  Surgeon: Waynetta Sandy, MD;  Location: [facility];  Service: Vascular;  Laterality: Left;   IR FLUORO GUIDE CV LINE RIGHT  12/09/2021   IR US GUIDE VASC ACCESS RIGHT  12/09/2021   LUMBAR [place] SURGERY  2021   L5-S1     Social History:   reports that he has been smoking cigarettes. He has been smoking an average of .15 packs per day. He has never used smokeless tobacco. He reports that he does not currently use alcohol.  He reports that he does not currently use drugs.   Family History:  His family history is not on file.   Allergies Allergies  Allergen Reactions   Gabapentin Shortness Of Breath and Rash    Scratchy throat    Metformin And Related Other (See Comments)    Affecting kidney function    Gabapentin Rash   Metformin And Related Rash     Home Medications  Prior to Admission medications   Medication Sig Start Date End Date Taking? Authorizing Provider  acetaminophen (TYLENOL) 325 MG tablet Take 2 tablets (650 mg total) by mouth every 6 (six) hours as needed for moderate pain. 05/19/22   Jaynee Eagles, PA-C  amLODipine (NORVASC) 10 MG tablet Take 1 tablet (10 mg total) by mouth at bedtime. Patient not taking: Reported on  03/31/2022 12/15/21   Elgergawy, Silver Huguenin, MD  amLODipine (NORVASC) 10 MG tablet Take 10 mg by mouth at bedtime. 04/23/22   [provider]  calcitRIOL (ROCALTROL) 0.25 MCG capsule Take 1 capsule (0.25 mcg total) by mouth daily. 12/16/21   Elgergawy, Silver Huguenin, MD  calcium acetate (PHOSLO) 667 MG capsule Take 2 capsules (1,334 mg total) by mouth 3 (three) times daily with meals. 12/15/21   Elgergawy, Silver Huguenin, MD  calcium acetate (PHOSLO) 667 MG capsule Take 1,334 mg by mouth 3 (three) times daily. 03/31/22   [provider]  carvedilol (COREG) 12.5 MG tablet Take 12.5 mg by mouth daily.    [provider]  carvedilol (COREG) 25 MG tablet Take 25 mg by mouth 2 (two) times daily. 05/31/22   [provider]  carvedilol (COREG) 6.25 MG tablet Take 1 tablet (6.25 mg total) by mouth 2 (two) times daily with a meal. Patient not taking: Reported on 03/31/2022 12/15/21   Elgergawy, Silver Huguenin, MD  Emollient (CETAPHIL) cream Apply 1 application  topically daily as needed (itching).    [provider]  hydrALAZINE (APRESOLINE) 50 MG tablet Take 1 tablet (50 mg total) by mouth 3 (three) times daily. Patient not taking: Reported on 03/31/2022 12/15/21   Elgergawy,  Silver Huguenin, MD  Insulin Pen Needle 32G X 4 MM MISC Use to inject insulin daily as directed. 12/15/21   Elgergawy, Silver Huguenin, MD  isosorbide mononitrate (IMDUR) 30 MG 24 hr tablet Take 1 tablet (30 mg total) by mouth daily. 12/16/21   Elgergawy, Silver Huguenin, MD  isosorbide mononitrate (IMDUR) 30 MG 24 hr tablet Take 30 mg by mouth at bedtime. 04/23/22   [provider]  losartan (COZAAR) 100 MG tablet Take 100 mg by mouth daily. 03/15/22   [provider]  methocarbamol (ROBAXIN) 500 MG tablet Take 1 tablet (500 mg total) by mouth 2 (two) times daily. 05/19/22   Jaynee Eagles, PA-C  multivitamin (RENA-VIT) TABS tablet Take 1 tablet by mouth at bedtime. 12/15/21   Elgergawy, Silver Huguenin, MD  SEMGLEE, YFGN, 100 UNIT/ML Pen Inject 10 Units into the skin daily. 01/20/22   [provider]  SEMGLEE, YFGN, 100 UNIT/ML Pen SMARTSIG:10 Unit(s) SUB-Q Daily 04/23/22   [provider]  tetrahydrozoline-zinc (VISINE-AC) 0.05-0.25 % ophthalmic solution Place 1 drop into both eyes 3 (three) times daily as needed (dry eyes).    [provider]  tiZANidine (ZANAFLEX) 4 MG tablet Take 4 mg by mouth 3 (three) times daily as needed for muscle spasms.    [provider]     Critical care time: 35 min.   Montey Hora, [facility] For pager details, please see AMION or use Epic chat  After 1900, please call Ross for cross coverage needs 08/25/2022, 12:12 AM

## 2022-08-25 NOTE — Progress Notes (Addendum)
STROKE TEAM PROGRESS NOTE   INTERVAL HISTORY His family is at the bedside.  He is intubated on prop '@45mcg'$ , Fentanyl @ 263mg, 3% @ 50cc/hr. Last NA 143. Prop increased to 637m and fentanyl to 30093mdue to agitation while in room.  SBP goal 130-150.  PERRL, sluggish disconjugated, upward gaze on left, no blink to threat. Does not follow commands. Withdraws in all 4 extremities. Moves all extremities spontaneously and is purposeful.    Vitals:   08/25/22 0545 08/25/22 0600 08/25/22 0615 08/25/22 0800  BP:  (!) 212/98    Pulse: (!) 57 61 61   Resp: '19 17 16   '$ Temp:    (!) 97.4 F (36.3 C)  TempSrc:    Axillary  SpO2: 100% 100% 100%   Weight:      Height:       CBC:  Recent Labs  Lab 08/20/22 0318 08/24/22 2330 08/25/22 0151 08/25/22 0440  WBC 11.7* 9.3  --  9.0  NEUTROABS 9.3* 6.3  --   --   HGB 8.7* 9.2*  10.5* 8.8* 7.7*  HCT 27.1* 29.5*  31.0* 26.0* 24.4*  MCV 65.9* 66.1*  --  65.8*  PLT 309 280  --  230702Basic Metabolic Panel:  Recent Labs  Lab 08/24/22 2330 08/25/22 0151 08/25/22 0440  NA 138  138 140 143  K 4.6  4.5 5.0 4.9  CL 92*  96*  --  98  CO2 27  --  27  GLUCOSE 154*  155*  --  89  BUN 44*  40*  --  46*  CREATININE 11.10*  12.10*  --  11.68*  CALCIUM 8.9  --  8.3*  MG  --   --  2.1  PHOS  --   --  6.8*   Lipid Panel: No results for input(s): "CHOL", "TRIG", "HDL", "CHOLHDL", "VLDL", "LDLCALC" in the last 168 hours. HgbA1c: No results for input(s): "HGBA1C" in the last 168 hours. Urine Drug Screen: No results for input(s): "LABOPIA", "COCAINSCRNUR", "LABBENZ", "AMPHETMU", "THCU", "LABBARB" in the last 168 hours.  Alcohol Level  Recent Labs  Lab 08/24/22 2330  ETH <10    IMAGING past 24 hours CT ANGIO HEAD NECK W WO CM (CODE STROKE)  Result Date: 08/25/2022 CLINICAL DATA:  Acute neuro deficit with stroke suspected. EXAM: CT ANGIOGRAPHY HEAD AND NECK TECHNIQUE: Multidetector CT imaging of the head and neck was performed using the  standard protocol during bolus administration of intravenous contrast. Multiplanar CT image reconstructions and MIPs were obtained to evaluate the vascular anatomy. Carotid stenosis measurements (when applicable) are obtained utilizing NASCET criteria, using the distal internal carotid diameter as the denominator. RADIATION DOSE REDUCTION: This exam was performed according to the departmental dose-optimization program which includes automated exposure control, adjustment of the mA and/or kV according to patient size and/or use of iterative reconstruction technique. CONTRAST:  43m58mNIPAQUE IOHEXOL 350 MG/ML SOLN COMPARISON:  Head CT from yesterday FINDINGS: CT HEAD FINDINGS Brain: Ovoid hematoma in the deep right cerebellum measuring 3.3 cm in length by 2.5 cm in maximal thickness, extending towards and likely into the fourth ventricle (triangular shape on sagittal reformats) but no intraventricular spread. Rim of adjacent edema without hydrocephalus. No visible infarct or mass. Vascular: See below Skull: Negative Sinuses/Orbits: Right cataract resection. Review of the MIP images confirms the above findings CTA NECK FINDINGS Aortic arch: No acute finding Right carotid system: Atheromatous plaque at the bifurcation without stenosis or ulceration. Left carotid system: Mild atheromatous  plaque at the bifurcation. No stenosis or ulceration. Vertebral arteries: No proximal subclavian stenosis. The vertebral arteries are smoothly contoured and diffusely patent. Skeleton: Spinal curvature without acute finding Other neck: Tongue distortion from endotracheal tube. Upper chest: Atelectasis seen in the apical lungs Review of the MIP images confirms the above findings CTA HEAD FINDINGS Anterior circulation: Atheromatous calcification along the carotid siphons without flow reducing stenosis or aneurysm. Posterior circulation: Vertebral and basilar tortuosity with atheromatous calcification. No branch occlusion, beading,  aneurysm, or vascular malformation. No spot sign Venous sinuses: Unremarkable for the arterial phase Anatomic variants: None significant Review of the MIP images confirms the above findings IMPRESSION: 1. No vascular lesion or spot sign seen at the right cerebellar hemorrhage. The hemorrhage is size stable from earlier today. There is likely extension into the fourth ventricle but no intraventricular spread or hydrocephalus. 2. Premature atherosclerosis with notable vessel tortuosity suggesting chronic hypertension. Electronically Signed   By: Jorje Guild M.D.   On: 08/25/2022 06:10   CT HEAD CODE STROKE WO CONTRAST  Addendum Date: 08/25/2022   ADDENDUM REPORT: 08/25/2022 02:33 ADDENDUM: Critical Value/emergent results were called by telephone at the time of interpretation on 08/24/2022 at 11:58 pm to provider [facility] , who verbally acknowledged these results. Electronically Signed   By: Ulyses Jarred M.D.   On: 08/25/2022 02:33   Result Date: 08/25/2022 CLINICAL DATA:  Code stroke.  Acute neurologic deficits EXAM: CT HEAD WITHOUT CONTRAST TECHNIQUE: Contiguous axial images were obtained from the base of the skull through the vertex without intravenous contrast. RADIATION DOSE REDUCTION: This exam was performed according to the departmental dose-optimization program which includes automated exposure control, adjustment of the mA and/or kV according to patient size and/or use of iterative reconstruction technique. COMPARISON:  None Available. FINDINGS: Brain: There is an acute intraparenchymal hematoma centered in the right cerebellar hemisphere measuring 2.6 x 1.8 cm with a craniocaudal dimension of 2.2 cm (volume equals 5.1 mL). There is severe mass effect on the fourth ventricle and cerebral aqueduct. No hydrocephalus. There is periventricular hypoattenuation compatible with chronic microvascular disease. Vascular: No abnormal hyperdensity of the major intracranial arteries or dural venous  sinuses. No intracranial atherosclerosis. Skull: The visualized skull base, calvarium and extracranial soft tissues are normal. Sinuses/Orbits: No fluid levels or advanced mucosal thickening of the visualized paranasal sinuses. No mastoid or middle ear effusion. The orbits are normal. IMPRESSION: Acute intraparenchymal hematoma centered in the right cerebellar hemisphere with severe mass effect on the fourth ventricle and cerebral aqueduct. No hydrocephalus. Electronically Signed: By: Ulyses Jarred M.D. On: 08/24/2022 23:42   DG Chest Port 1 View  Result Date: 08/25/2022 CLINICAL DATA:  Respiratory failure EXAM: PORTABLE CHEST 1 VIEW COMPARISON:  05/19/2022 FINDINGS: Endotracheal tube is in place 6.2 cm above the carina with its tip at the level of the clavicular heads. Lung volumes are small and there is right basilar atelectasis. No pneumothorax or pleural effusion. Mild cardiomegaly is present. Trace perihilar interstitial pulmonary edema noted. No acute bone abnormality. IMPRESSION: 1. Endotracheal tube at the thoracic inlet, 6.2 cm above the carina. 2. Pulmonary hypoinflation. 3. Trace perihilar interstitial pulmonary edema, possibly cardiogenic in nature. 4. Mild cardiomegaly. Electronically Signed   By: Fidela Salisbury M.D.   On: 08/25/2022 00:43    PHYSICAL EXAM  Temp:  [97.2 F (36.2 C)-98.3 F (36.8 C)] 97.4 F (36.3 C) (11/15 0800) Pulse Rate:  [57-78] 63 (11/15 0747) Resp:  [15-48] 17 (11/15 0747) BP: (91-241)/(49-150) 136/59 (11/15 0747)  SpO2:  [98 %-100 %] 100 % (11/15 0747) Arterial Line BP: (121-188)/(52-77) 188/72 (11/15 0615) FiO2 (%):  [50 %-100 %] 50 % (11/15 0747) Weight:  [80.4 kg] 80.4 kg (11/14 2334)  General - intubated and sedated on ventilator, restless and agitated. Cardiovascular - Regular rhythm and rate.  Mental Status -  PERRL, sluggish disconjugated, upward gaze on left, no blink to threat. Does not follow commands   Motor Strength - The patient's strength  was normal in all extremities and pronator drift was absent.  Bulk was normal and fasciculations were absent.   Motor Tone - Muscle tone was assessed at the neck and appendages and was normal.  Sensory - Light touch, temperature/pinprick were assessed and were symmetrical.    Coordination - Unable to assess   Gait and Station - deferred.  ASSESSMENT/PLAN [name] is a 51 y.o. male with history of end-stage renal disease on intermittent hemodialysis, diabetes, hypertension presented with  witnessed onset slurred speech, with headache at 10:30 PM for which EMS was activated.  On their arrival they report blood pressure was 260/140, glucose 126   Stroke: Acute right cerebellar small ICH, likely due to chronic uncontrolled hypertension  Code Stroke CT head Acute intraparenchymal hematoma centered in the right cerebellar hemisphere with severe mass effect on the fourth ventricle and cerebral aqueduct. No hydrocephalus. CT repeat - hemorrhage size stable from earlier today. There is likely extension into the fourth ventricle but no intraventricular spread or hydrocephalus. CT head and neck premature atherosclerosis with notable vessel tortuosity suggesting chronic hypertension CT repeat in a.m. Will consider MRI with and without contrast once stable 2D Echo EF 50 to 55% LDL pending HgbA1c pending VTE prophylaxis - SCD's No antithrombotic prior to admission, now on No antithrombotic.  Therapy recommendations:  pending Disposition:  pending  Cerebral Edema On 3% saline protocol  Last NA 143->148, with NA goal 150-155  3% @ 50cc/hr  Serial NA checks  NSGY following - no surgical intervention at this time   ESRD on HD HD MWF Renal Dr. Jonnie Finner following, appreciate help Had HD catheter, starting CRRT today  OK to continue 3% saline with 23.4% PRN  Acute hypoxic Respiratory failure  Vent management per CCM  On Fentanyl and prop for sedation  Versed as needed for agitation  on vent  Hypertension Home meds:  norvasc 10 mg, coreg 25 mg, imdur 30 mg, losartan 100 mg- on hold  UnStable Cleviprex gtt currently off SBP goal 130-150 BP goal normotensive  Hyperlipidemia Home meds:  none LDL pending goal < 70 Continue statin at discharge  Diabetes type II Controlled Home meds:  Semglee HgbA1c 5.9, goal < 7.0 CBGs SSI  dysphagia  OG tube  [facility] day # 1  Beulah Gandy DNP, ACNPC-AG   ATTENDING NOTE: I reviewed above note and agree with the assessment and plan. Pt was seen and examined.   51 year old male with history of hypertension, diabetes, ESRD on hemodialysis, admitted for headache, slurred speech, elevated BP with nausea vomiting.  Developed decreased level of consciousness, intubated in ED.  CT showed right cerebellum ICH extending to vermis and compressing fourth ventricle.  Repeat CT stable.  CT head and neck unremarkable.  2D echo EF 50 to 50%, LDL and A1c pending.  Hemoglobin 7.7.  On exam, pt brother and fiance at the bedside, pt is intubated on sedation, still frequent coughing and required more sedation, eyes closed, not following commands. With forced eye opening,  right eye in mid position, left eye in upward position, not blinking to visual threat, doll's eyes not tested due to frequent coughing from vent, not tracking, PERRL. Corneal reflex present, gag and cough strongly present. Breathing over the vent.  Facial symmetry not able to test due to ET tube.  Tongue protrusion not cooperative. With coughing, pt actively moving all extremities, trying to reach tube with both hands, seems equal bilaterally. Sensation, coordination and gait not tested.  Etiology for patient ICH likely due to uncontrolled hypertension secondary to ESRD.  Currently on 3% saline, discussed with Dr. Jonnie Finner nephrology, will put in HD catheter start CRRT, continue 3% saline with 23.4 saline as needed for sodium goal 1 50-1 55.  CT repeat in a.m.  Currently  on ventilation and sedation, vent management per CCM.  BP goal < 160. I had long discussion with brother and fianc at bedside, updated pt current condition, treatment plan and potential prognosis, and answered all the questions.  They expressed understanding and appreciation.   For detailed assessment and plan, please refer to above/below as I have made changes wherever appropriate.   Rosalin Hawking, MD PhD Stroke Neurology 08/25/2022 1:07 PM  This patient is critically ill due to cerebellar ICH, cerebral edema, respiratory failure, ESRD, hypertensive emergency and at significant risk of neurological worsening, death form hematoma expansion, brain herniation, obstructive hydrocephalus, seizure. This patient's care requires constant monitoring of vital signs, hemodynamics, respiratory and cardiac monitoring, review of multiple databases, neurological assessment, discussion with family, other specialists and medical decision making of high complexity. I spent 35 minutes of neurocritical care time in the care of this patient.  I discussed with CCM Dr. Lake Bells and nephrology Dr. Jonnie Finner.    To contact Stroke Continuity provider, please refer to [email or web address] After hours, contact General Neurology

## 2022-08-25 NOTE — Progress Notes (Signed)
[facility] Progress Note Patient Name: Kaydence Baba DOB: Apr 28, 1971 MRN: [id number]   Date of Service  08/25/2022  HPI/Events of Note  Patient coughing on the ventilator with current Propofol ceiling of 50 mcg + Fentanyl gtt.Marland Kitchen  eICU Interventions  Propofol ceiling increased to 80 mcg.        Kerry Kass Darrold Bezek 08/25/2022, 2:50 AM

## 2022-08-25 NOTE — Consult Note (Signed)
CC: stroke symptoms  HPI:     Patient is a 51 y.o. male with ESRD, DM, HTN recently admitted for fluid overload developed headache and slurred speech with nausea.  He was found to have a 2.3 cm R cerebellar hemorrhage centered in the dentate nucleus.  He was intubated for airway protection due to his vomiting and drowsiness.  CT angiogram done 6 hours later showed no vascular abnormality and the hemorrhage was stable.    Patient Active Problem List   Diagnosis Date Noted   Acute respiratory failure with hypoxia ([place]) 08/25/2022   Hypertensive emergency 08/25/2022   Hemorrhagic stroke ([place]) 08/25/2022   ICH (intracerebral hemorrhage) ([place]) 08/24/2022   ESRD (end stage renal disease) ([place]) 12/15/2021   Essential hypertension 12/15/2021   Diabetes mellitus ([place]) 12/15/2021   CKD (chronic kidney disease), stage IV ([place]) 12/08/2021   AKI (acute kidney injury) ([place]) 12/08/2021   MGUS (monoclonal gammopathy of unknown significance) 08/06/2019   Microcytic anemia 08/06/2019   Past Medical History:  Diagnosis Date   Anemia    CKD (chronic kidney disease)    on wed 12/09/21   Diabetes mellitus without complication ([place])    type 2   Diabetes mellitus without complication ([place])    Hypertension    Renal disorder     Past Surgical History:  Procedure Laterality Date   AV FISTULA PLACEMENT Left 12/10/2021   Procedure: LEFT RADIAL CEPHALIC  ARTERIOVENOUS (AV) FISTULA;  Surgeon: Waynetta Sandy, MD;  Location: [place];  Service: Vascular;  Laterality: Left;   IR FLUORO GUIDE CV LINE RIGHT  12/09/2021   IR US GUIDE VASC ACCESS RIGHT  12/09/2021   LUMBAR [place]  2021   L5-S1    Medications Prior to Admission  Medication Sig Dispense Refill Last Dose   acetaminophen (TYLENOL) 325 MG tablet Take 2 tablets (650 mg total) by mouth every 6 (six) hours as needed for moderate pain. 30 tablet 0    amLODipine (NORVASC) 10 MG tablet Take 10 mg by mouth at bedtime.      calcitRIOL (ROCALTROL)  0.25 MCG capsule Take 1 capsule (0.25 mcg total) by mouth daily. 30 capsule 0    calcium acetate (PHOSLO) 667 MG capsule Take 1,334 mg by mouth 3 (three) times daily.      carvedilol (COREG) 25 MG tablet Take 25 mg by mouth 2 (two) times daily.      Emollient (CETAPHIL) cream Apply 1 application  topically daily as needed (itching).      hydrALAZINE (APRESOLINE) 50 MG tablet Take 1 tablet (50 mg total) by mouth 3 (three) times daily. (Patient not taking: Reported on 03/31/2022) 90 tablet 0    Insulin Pen Needle 32G X 4 MM MISC Use to inject insulin daily as directed. 100 each 0    isosorbide mononitrate (IMDUR) 30 MG 24 hr tablet Take 30 mg by mouth at bedtime.      losartan (COZAAR) 100 MG tablet Take 100 mg by mouth daily.      methocarbamol (ROBAXIN) 500 MG tablet Take 1 tablet (500 mg total) by mouth 2 (two) times daily. 20 tablet 0    multivitamin (RENA-VIT) TABS tablet Take 1 tablet by mouth at bedtime. 30 tablet 0    SEMGLEE, YFGN, 100 UNIT/ML Pen SMARTSIG:10 Unit(s) SUB-Q Daily      tetrahydrozoline-zinc (VISINE-AC) 0.05-0.25 % ophthalmic solution Place 1 drop into both eyes 3 (three) times daily as needed (dry eyes).      tiZANidine (ZANAFLEX) 4  MG tablet Take 4 mg by mouth 3 (three) times daily as needed for muscle spasms.      Allergies  Allergen Reactions   Gabapentin Shortness Of Breath and Rash    Scratchy throat    Metformin And Related Other (See Comments)    Affecting kidney function    Gabapentin Rash   Metformin And Related Rash    Social History   Tobacco Use   Smoking status: Some Days    Packs/day: 0.15    Types: Cigarettes   Smokeless tobacco: Never  Substance Use Topics   Alcohol use: Not Currently    No family history on file.   Review of Systems Review of systems not obtained due to patient factors.  Objective:   Patient Vitals for the past 8 hrs:  BP Temp Temp src Pulse Resp SpO2 Height  08/25/22 0800 -- (!) 97.4 F (36.3 C) Axillary -- -- -- --   08/25/22 0615 -- -- -- 61 16 100 % --  08/25/22 0600 (!) 212/98 -- -- 61 17 100 % --  08/25/22 0545 -- -- -- (!) 57 19 100 % --  08/25/22 0530 94/68 -- -- (!) 58 17 100 % --  08/25/22 0515 -- -- -- 60 16 100 % --  08/25/22 0500 128/60 -- -- 60 16 100 % --  08/25/22 0445 -- -- -- 60 16 100 % --  08/25/22 0430 (!) 129/58 -- -- (!) 59 16 100 % --  08/25/22 0415 -- -- -- (!) 59 16 100 % --  08/25/22 0400 (!) 126/49 (!) 97.2 F (36.2 C) Axillary 61 16 100 % --  08/25/22 0345 -- -- -- 60 16 100 % --  08/25/22 0330 (!) 116/58 -- -- 60 16 100 % --  08/25/22 0325 -- -- -- 60 16 100 % '5\' 5"'$  (1.651 m)  08/25/22 0315 -- -- -- 60 16 100 % --  08/25/22 0300 (!) 145/61 -- -- 61 16 100 % --  08/25/22 0245 -- -- -- 63 -- 100 % --  08/25/22 0230 -- -- -- 61 15 100 % --  08/25/22 0215 -- -- -- 62 16 100 % --  08/25/22 0200 (!) 149/65 -- -- 62 (!) 37 100 % --  08/25/22 0145 -- -- -- 65 (!) 22 100 % --  08/25/22 0130 -- -- -- 62 (!) 23 100 % --  08/25/22 0117 121/72 -- -- 63 16 100 % --  08/25/22 0115 -- -- -- 62 16 100 % --  08/25/22 0110 -- 97.6 F (36.4 C) Axillary -- -- -- --   I/O last 3 completed shifts: In: 285.3 [I.V.:285.3] Out: -  No intake/output data recorded.    Intubated Sedated on propfol, Fentanyl Eyes closed, pupils 3 mm reactive to 2 mm bilaterally Localizes bilaterally briskly in [place], w/d in lower extremities  Data ReviewCBC:  Lab Results  Component Value Date   WBC 9.0 08/25/2022   RBC 3.71 (L) 08/25/2022   BMP:  Lab Results  Component Value Date   GLUCOSE 89 08/25/2022   CO2 27 08/25/2022   BUN 46 (H) 08/25/2022   CREATININE 11.68 (H) 08/25/2022   CREATININE 2.54 (H) 08/09/2019   CALCIUM 8.3 (L) 08/25/2022   Radiology review:  Initial CT head and repeat CT angiogram reviewed 2.3 cm deep R cerebellar hemorrhage centered in the dentate.  Some extension into the vermis. No hydrocephalus.  Some effacement of 4th ventricle, but no shift.  Some edema around  hemorrhage.  Assessment:   Principal Problem:   ICH (intracerebral hemorrhage) (HCC) Active Problems:   Acute respiratory failure with hypoxia (HCC)   Hypertensive emergency   Hemorrhagic stroke ([place])  Hypertensive cerebellar hemorrhage  Plan:   - I do not recommend surgical intervention as his hematoma is small-to-medium size without significant shift noted.  His hematoma is stable in size on repeat imaging.  However, with his ESRD, he is at risk of increased swelling, and would recommend avoiding large osmotic shifts, and more aggressive treatment with hypertonic agents for goal sodium of 150-155.  I discussed wthis with the treating neurology team.

## 2022-08-25 NOTE — ED Triage Notes (Signed)
BIB EMS for Code Stroke LKW 2330 family noted a mental status change, patient with left sided weakness, slurred speech, and left sided gaze preference. Pt cc of chest pain    Last EMS VS: 260/140 78 99%

## 2022-08-25 NOTE — Consult Note (Addendum)
Renal Service Consult Note [facility]  Tasha Ballon 08/25/2022 Sol Blazing, MD Requesting Physician: Dr. Erlinda Hong  Reason for Consult: ESRD pt w/ cerebellar hemorrhage/ bleed HPI: The patient is a 51 y.o. year-old w/ hx as below who presented w/ slurred speech and a headache. On arrival BP was 260/140. He was intubated for airway protection in the ED and treated w/ IV meds for BP control. Asked to see for RRT.    Pt seen in ICU, pt is on the vent, no hx obtained.    ROS - n/a  Past Medical History  Past Medical History:  Diagnosis Date   Anemia    CKD (chronic kidney disease)    on wed 12/09/21   Diabetes mellitus without complication ([place])    type 2   Hypertension    Renal disorder    Past Surgical History  Past Surgical History:  Procedure Laterality Date   AV FISTULA PLACEMENT Left 12/10/2021   Procedure: LEFT RADIAL CEPHALIC  ARTERIOVENOUS (AV) FISTULA;  Surgeon: Waynetta Sandy, MD;  Location: [place];  Service: Vascular;  Laterality: Left;   IR FLUORO GUIDE CV LINE RIGHT  12/09/2021   IR US GUIDE VASC ACCESS RIGHT  12/09/2021   LUMBAR [place] SURGERY  2021   L5-S1   Family History No family history on file. Social History  reports that he has been smoking cigarettes. He has been smoking an average of .15 packs per day. He has never used smokeless tobacco. He reports that he does not currently use alcohol. He reports that he does not currently use drugs. Allergies  Allergies  Allergen Reactions   Gabapentin Shortness Of Breath and Rash    Scratchy throat    Metformin And Related Other (See Comments)    Affecting kidney function    Home medications Prior to Admission medications   Medication Sig Start Date End Date Taking? Authorizing Provider  acetaminophen (TYLENOL) 325 MG tablet Take 2 tablets (650 mg total) by mouth every 6 (six) hours as needed for moderate pain. Patient taking differently: Take 650 mg by mouth as needed for moderate  pain or headache. 05/19/22  Yes Jaynee Eagles, PA-C  amLODipine (NORVASC) 10 MG tablet Take 10 mg by mouth at bedtime. 04/23/22  Yes [provider]  carvedilol (COREG) 25 MG tablet Take 25 mg by mouth daily. 05/31/22  Yes [provider]  Emollient (CETAPHIL) cream Apply 1 application  topically daily as needed (itching).   Yes [provider]  isosorbide mononitrate (IMDUR) 30 MG 24 hr tablet Take 30 mg by mouth at bedtime. 04/23/22  Yes [provider]  losartan (COZAAR) 100 MG tablet Take 100 mg by mouth daily. 03/15/22  Yes [provider]  PRESCRIPTION MEDICATION Inject 10 Units into the skin daily. [place]   Yes [provider]  calcitRIOL (ROCALTROL) 0.25 MCG capsule Take 1 capsule (0.25 mcg total) by mouth daily. 12/16/21   Elgergawy, Silver Huguenin, MD  calcium acetate (PHOSLO) 667 MG capsule Take 1,334 mg by mouth 3 (three) times daily. 03/31/22   [provider]  hydrALAZINE (APRESOLINE) 50 MG tablet Take 1 tablet (50 mg total) by mouth 3 (three) times daily. Patient not taking: Reported on 03/31/2022 12/15/21   Elgergawy, Silver Huguenin, MD  methocarbamol (ROBAXIN) 500 MG tablet Take 1 tablet (500 mg total) by mouth 2 (two) times daily. Patient not taking: Reported on 08/25/2022 05/19/22   Jaynee Eagles, PA-C  multivitamin (RENA-VIT) TABS tablet Take 1 tablet  by mouth at bedtime. 12/15/21   Elgergawy, Silver Huguenin, MD  SEMGLEE, YFGN, 100 UNIT/ML Pen SMARTSIG:10 Unit(s) SUB-Q Daily 04/23/22   [provider]  tetrahydrozoline-zinc (VISINE-AC) 0.05-0.25 % ophthalmic solution Place 1 drop into both eyes 3 (three) times daily as needed (dry eyes).    [provider]  tiZANidine (ZANAFLEX) 4 MG tablet Take 4 mg by mouth 3 (three) times daily as needed for muscle spasms. Patient not taking: Reported on 08/25/2022    [provider]     Vitals:   08/25/22 0615 08/25/22 0747 08/25/22 0800 08/25/22 1110  BP:  (!) 136/59  (!) 154/59   Pulse: 61 63  62  Resp: '16 17  17  '$ Temp:   (!) 97.4 F (36.3 C) 99.3 F (37.4 C)  TempSrc:   Axillary   SpO2: 100% 100%  100%  Weight:      Height:       Exam Gen on vent, sedated No rash, cyanosis or gangrene Sclera anicteric, throat w/ ETT No jvd or bruits Chest clear anterior/ lateral RRR no RG Abd soft ntnd no mass or ascites +bs GU normal male MS no joint effusions or deformity Ext no UE or LE edema, no wounds or ulcers Neuro is on vent, sedated   LFA AVF+bruit    Home meds include - amlodipine 10, coreg 25 qd, losartan 100, hydralazine 50 tid, MVI, semglee insulin, renavite, phoslo2 ac tid, rocaltrol 0.25 qd, prns/ vits     OP HD: SW MWF 3h 23mn  80kg   400/800   2/2 bath  Hep 2000  LFA AVF - last HD 11/13, post 80.1kg - mircera 150 q2, last 11/13, due 11 27  - hectorol 4 ug IV tiw   Assessment/ Plan: 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 Goal. Have d/w pharm and neurology, will use 23.4% and/or 3% saline for goal Na+ 150- 155 to help lower ICP's.  Uncont HTN - getting IV cleviprex gtt, goal SBP 130- 150 ESRD - on HD MWF. Has not missed HD, HD would have been due today. Will plan CRRT for this pt to limit fluid shifts that are likely w/ iHD.  VDRF - per CCM Anemia esrd - Hb 7.5-10 here, follow, give prbcs as needed. Next esa due 11/27.  Volume - at dry wt, CXR clear, no vol ^ on exam. Keep even w/ crrt MBD ckd - CCa in range and phos slightly high at 6.8. Cont IV vdra w/ hd tiw. Resume binder when eating.  DM2 - per pmd      RKelly Splinter MD 08/25/2022, 1:20 PM Recent Labs  Lab 08/24/22 2330 08/25/22 0151 08/25/22 0440  HGB 9.2*  10.5* 8.8* 7.7*  ALBUMIN 3.4*  --   --   CALCIUM 8.9  --  8.3*  PHOS  --   --  6.8*  CREATININE 11.10*  12.10*  --  11.68*  K 4.6  4.5 5.0 4.9   Inpatient medications:  Chlorhexidine Gluconate Cloth  6 each Topical Daily   docusate  100 mg Per Tube BID   insulin aspart  0-6 Units Subcutaneous  Q4H   mouth rinse  15 mL Mouth Rinse Q2H   pantoprazole (PROTONIX) IV  40 mg Intravenous Daily   polyethylene glycol  17 g Per Tube Daily    sodium chloride     clevidipine 2 mg/hr (08/25/22 1312)   fentaNYL infusion INTRAVENOUS 300 mcg/hr (08/25/22 1008)   propofol (DIPRIVAN) infusion  60 mcg/kg/min (08/25/22 1051)   sodium chloride (hypertonic) 50 mL/hr at 08/25/22 0736   Place/Maintain arterial line **AND** sodium chloride, etomidate, heparin, midazolam, mouth rinse, rocuronium

## 2022-08-25 NOTE — Procedures (Addendum)
Arterial Catheter Insertion Procedure Note  Yves Fodor  [id number]  [birth year]  Date:08/25/22  Time:1:52 AM    Provider Performing: Quentin Ore    Procedure: Insertion of Arterial Line [phone]) without US guidance  Indication(s) Blood pressure monitoring and/or need for frequent ABGs  Consent Risks of the procedure as well as the alternatives and risks of each were explained to the patient and/or caregiver.  Consent for the procedure was obtained and is signed in the bedside chart  Anesthesia None   Time Out Verified patient identification, verified procedure, site/side was marked, verified correct patient position, special equipment/implants available, medications/allergies/relevant history reviewed, required imaging and test results available.   Sterile Technique Maximal sterile technique including full sterile barrier drape, hand hygiene, sterile gown, sterile gloves, mask, hair covering, sterile ultrasound probe cover (if used).   Procedure Description Area of catheter insertion was cleaned with chlorhexidine and draped in sterile fashion. Without real-time ultrasound guidance an arterial catheter was placed into the right radial artery.  Appropriate arterial tracings confirmed on monitor.     Complications/Tolerance None; patient tolerated the procedure well.   EBL Minimal   Specimen(s) None

## 2022-08-25 NOTE — Progress Notes (Signed)
  Echocardiogram 2D Echocardiogram has been performed.  Randy Ross 08/25/2022, 11:48 AM

## 2022-08-25 NOTE — ED Notes (Signed)
MD Bhagat notified of sudden drop in blood pressure. Cleviprex turned off beforehand, and propofol paused. BP raising back up. Verbal order by MD Bhagat continue fentanyl and hold of CT for right now.

## 2022-08-25 NOTE — Progress Notes (Signed)
[facility] Progress Note Patient Name: Randy Ross DOB: [birth year] MRN: [id number]   Date of Service  08/25/2022  HPI/Events of Note  Patient admitted with malignant hypertension and a cerebellar intracranial hemorrhage with brain edema, and mass effect on the 4 th ventricle, he was intubated for acute respiratory failure and PCCM consulted for ventilator management.  eICU Interventions  New Patient Evaluation.        Kerry Kass Fredrich Cory 08/25/2022, 2:23 AM

## 2022-08-26 ENCOUNTER — Inpatient Hospital Stay (HOSPITAL_COMMUNITY): Payer: No Typology Code available for payment source

## 2022-08-26 LAB — RENAL FUNCTION PANEL
Albumin: 2.7 g/dL — ABNORMAL LOW (ref 3.5–5.0)
Albumin: 2.4 g/dL — ABNORMAL LOW (ref 3.5–5.0)
Anion gap: 10 (ref 5–15)
Anion gap: 5 (ref 5–15)
BUN: 37 mg/dL — ABNORMAL HIGH (ref 6–20)
BUN: 30 mg/dL — ABNORMAL HIGH (ref 6–20)
CO2: 23 mmol/L (ref 22–32)
CO2: 23 mmol/L (ref 22–32)
Calcium: 7.7 mg/dL — ABNORMAL LOW (ref 8.9–10.3)
Calcium: 7.6 mg/dL — ABNORMAL LOW (ref 8.9–10.3)
Chloride: 108 mmol/L (ref 98–111)
Chloride: 119 mmol/L — ABNORMAL HIGH (ref 98–111)
Creatinine, Ser: 7.87 mg/dL — ABNORMAL HIGH (ref 0.61–1.24)
Creatinine, Ser: 5.64 mg/dL — ABNORMAL HIGH (ref 0.61–1.24)
GFR, Estimated: 8 mL/min — ABNORMAL LOW (ref >60)
GFR, Estimated: 11 mL/min — ABNORMAL LOW (ref >60)
Glucose, Bld: 165 mg/dL — ABNORMAL HIGH (ref 70–99)
Glucose, Bld: 231 mg/dL — ABNORMAL HIGH (ref 70–99)
Phosphorus: 3.6 mg/dL (ref 2.5–4.6)
Phosphorus: 2.5 mg/dL (ref 2.5–4.6)
Potassium: 4.8 mmol/L (ref 3.5–5.1)
Potassium: 5.1 mmol/L (ref 3.5–5.1)
Sodium: 141 mmol/L (ref 135–145)
Sodium: 147 mmol/L — ABNORMAL HIGH (ref 135–145)

## 2022-08-26 LAB — GLUCOSE, CAPILLARY
Glucose-Capillary: 141 mg/dL — ABNORMAL HIGH (ref 70–99)
Glucose-Capillary: 213 mg/dL — ABNORMAL HIGH (ref 70–99)
Glucose-Capillary: 212 mg/dL — ABNORMAL HIGH (ref 70–99)
Glucose-Capillary: 196 mg/dL — ABNORMAL HIGH (ref 70–99)
Glucose-Capillary: 213 mg/dL — ABNORMAL HIGH (ref 70–99)
Glucose-Capillary: 244 mg/dL — ABNORMAL HIGH (ref 70–99)

## 2022-08-26 LAB — CBC
HCT: 27.2 % — ABNORMAL LOW (ref 39.0–52.0)
Hemoglobin: 8.5 g/dL — ABNORMAL LOW (ref 13.0–17.0)
MCH: 21.4 pg — ABNORMAL LOW (ref 26.0–34.0)
MCHC: 31.3 g/dL (ref 30.0–36.0)
MCV: 68.3 fL — ABNORMAL LOW (ref 80.0–100.0)
Platelets: 247 10*3/uL (ref 150–400)
RBC: 3.98 MIL/uL — ABNORMAL LOW (ref 4.22–5.81)
RDW: 20.9 % — ABNORMAL HIGH (ref 11.5–15.5)
WBC: 25.9 10*3/uL — ABNORMAL HIGH (ref 4.0–10.5)
nRBC: 0.4 % — ABNORMAL HIGH (ref 0.0–0.2)

## 2022-08-26 LAB — SODIUM
Sodium: 144 mmol/L (ref 135–145)
Sodium: 148 mmol/L — ABNORMAL HIGH (ref 135–145)
Sodium: 146 mmol/L — ABNORMAL HIGH (ref 135–145)
Sodium: 150 mmol/L — ABNORMAL HIGH (ref 135–145)
Sodium: 149 mmol/L — ABNORMAL HIGH (ref 135–145)
Sodium: 150 mmol/L — ABNORMAL HIGH (ref 135–145)

## 2022-08-26 LAB — HEMOGLOBIN A1C
Hgb A1c MFr Bld: 5.9 % — ABNORMAL HIGH (ref 4.8–5.6)
Mean Plasma Glucose: 123 mg/dL

## 2022-08-26 LAB — LIPID PANEL
Cholesterol: 186 mg/dL (ref 0–200)
HDL: 21 mg/dL — ABNORMAL LOW (ref >40)
LDL Cholesterol: 91 mg/dL (ref 0–99)
Total CHOL/HDL Ratio: 8.9 RATIO
Triglycerides: 372 mg/dL — ABNORMAL HIGH (ref <150)
VLDL: 74 mg/dL — ABNORMAL HIGH (ref 0–40)

## 2022-08-26 LAB — MAGNESIUM
Magnesium: 2.4 mg/dL (ref 1.7–2.4)
Magnesium: 2.3 mg/dL (ref 1.7–2.4)

## 2022-08-26 LAB — PHOSPHORUS: Phosphorus: 3.3 mg/dL (ref 2.5–4.6)

## 2022-08-26 LAB — TRIGLYCERIDES: Triglycerides: 355 mg/dL — ABNORMAL HIGH (ref <150)

## 2022-08-26 MED ORDER — LABETALOL HCL 5 MG/ML IV SOLN
5.0000 mg | INTRAVENOUS | Status: DC | PRN
  Administered 2022-08-27 (×3): 10 mg via INTRAVENOUS
  Administered 2022-08-28 (×3): 20 mg via INTRAVENOUS
  Administered 2022-08-28: 15 mg via INTRAVENOUS
  Administered 2022-08-28 – 2022-08-29 (×3): 10 mg via INTRAVENOUS
  Administered 2022-08-30 – 2022-08-31 (×17): 20 mg via INTRAVENOUS
  Administered 2022-08-31 (×2): 10 mg via INTRAVENOUS
  Administered 2022-09-01: 20 mg via INTRAVENOUS
  Administered 2022-09-01: 10 mg via INTRAVENOUS
  Administered 2022-09-01 – 2022-09-05 (×19): 20 mg via INTRAVENOUS
  Administered 2022-09-06: 10 mg via INTRAVENOUS
  Administered 2022-09-06 – 2022-09-11 (×17): 20 mg via INTRAVENOUS
  Administered 2022-09-12 – 2022-09-13 (×2): 10 mg via INTRAVENOUS
  Administered 2022-09-14 – 2022-09-15 (×3): 20 mg via INTRAVENOUS
  Administered 2022-10-09: 10 mg via INTRAVENOUS
  Administered 2022-10-10: 20 mg via INTRAVENOUS
  Filled 2022-08-26 (×68): qty 4

## 2022-08-26 MED ORDER — CARVEDILOL 12.5 MG PO TABS
12.5000 mg | ORAL_TABLET | Freq: Two times a day (BID) | ORAL | Status: DC
  Administered 2022-08-26 – 2022-08-27 (×2): 12.5 mg
  Filled 2022-08-26 (×2): qty 1

## 2022-08-26 MED ORDER — SODIUM CHLORIDE 23.4 % INJECTION (4 MEQ/ML) FOR IV ADMINISTRATION
120.0000 meq | Freq: Once | INTRAVENOUS | Status: AC
  Administered 2022-08-26: 120 meq via INTRAVENOUS
  Filled 2022-08-26: qty 30

## 2022-08-26 MED ORDER — AMLODIPINE BESYLATE 10 MG PO TABS: 10.0000 mg | ORAL_TABLET | Freq: Every day | ORAL | Status: DC

## 2022-08-26 MED ORDER — AMLODIPINE BESYLATE 10 MG PO TABS
10.0000 mg | ORAL_TABLET | Freq: Every day | ORAL | Status: DC
  Administered 2022-08-26 – 2022-08-27 (×2): 10 mg
  Filled 2022-08-26 (×2): qty 1

## 2022-08-26 MED ORDER — SODIUM CHLORIDE 0.9 % IV SOLN
2.0000 g | Freq: Two times a day (BID) | INTRAVENOUS | Status: DC
  Administered 2022-08-26 (×2): 2 g via INTRAVENOUS
  Filled 2022-08-26 (×3): qty 12.5

## 2022-08-26 MED ORDER — HYDRALAZINE HCL 25 MG PO TABS
25.0000 mg | ORAL_TABLET | Freq: Three times a day (TID) | ORAL | Status: DC
  Administered 2022-08-26 – 2022-08-27 (×3): 25 mg
  Filled 2022-08-26 (×3): qty 1

## 2022-08-26 NOTE — Progress Notes (Signed)
[facility] Progress Note  Subjective: seen in ICU, Na+ up to 148 this am.   Vitals:   08/26/22 0700 08/26/22 0800 08/26/22 0825 08/26/22 0826  BP: 136/63 (!) 142/66    Pulse: 82 80 82   Resp: '16 16 20   '$ Temp: 99.1 F (37.3 C) 99.1 F (37.3 C) 99.3 F (37.4 C)   TempSrc:  Esophageal    SpO2: 98% 97% 92% 93%  Weight:      Height:        Exam: Gen on vent, sedated Sclera anicteric, throat w/ ETT No jvd or bruits Chest clear anterior/ lateral RRR no RG Abd soft ntnd no mass or ascites +bs Ext no UE or LE edema Neuro is on vent, sedated   LFA AVF+bruit      Home meds include - amlodipine 10, coreg 25 qd, losartan 100, hydralazine 50 tid, MVI, semglee insulin, renavite, phoslo2 ac tid, rocaltrol 0.25 qd, prns/ vits        OP HD: SW MWF 3h 16mn  80kg   400/800   2/2 bath  Hep 2000  LFA AVF - last HD 11/13, post 80.1kg - mircera 150 q2, last 11/13, due 11 27  - hectorol 4 ug IV tiw     Assessment/ Plan: 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 Getting hypertonic saline to lower ICP w/ goal Na+ 150-155. Na 148 today, will ^3% to 100 cc/hr and check Na+ more frequently for bolus 24.3% saline prn.  Uncont HTN - getting IV cleviprex gtt, goal SBP 130- 150 ESRD - on HD MWF. CRRT started on 11/15 for avoidance of vol overload/ acute shifts w/ iHD.  VDRF - per CCM Anemia esrd - Hb 7.5-10 here, follow, give prbcs as needed. Next esa due 11/27.  Volume - at dry wt, CXR clear, no vol ^ on exam. Keeping even w/ crrt MBD ckd - CCa in range and phos slightly high at 6.8. Cont IV vdra w/ hd tiw. Resume binder when eating.  DM2 - per pmd    Rob Madilyne Tadlock 08/26/2022, 10:29 AM   Recent Labs  Lab 08/25/22 0440 08/25/22 1621 08/26/22 0410  HGB 7.7*  --  8.5*  ALBUMIN  --  2.7* 2.7*  CALCIUM 8.3* 7.9* 7.7*  PHOS 6.8* 5.3*  5.3* 3.6  CREATININE 11.68* 10.69* 7.87*  K 4.9 4.2 4.8   No results for input(s): "IRON", "TIBC", "FERRITIN" in the  last 168 hours. Inpatient medications:  amLODipine  10 mg Per Tube Daily   carvedilol  12.5 mg Per Tube BID WC   Chlorhexidine Gluconate Cloth  6 each Topical Daily   docusate  100 mg Per Tube BID   feeding supplement (PROSource TF20)  60 mL Per Tube TID   hydrALAZINE  25 mg Per Tube Q8H   insulin aspart  0-6 Units Subcutaneous Q4H   multivitamin  1 tablet Per Tube QHS   mouth rinse  15 mL Mouth Rinse Q2H   pantoprazole (PROTONIX) IV  40 mg Intravenous Daily   polyethylene glycol  17 g Per Tube Daily     prismasol BGK 4/2.5 400 mL/hr at 08/26/22 0300    prismasol BGK 4/2.5 400 mL/hr at 08/26/22 0300   sodium chloride     ceFEPime (MAXIPIME) IV 2 g (08/26/22 1006)   clevidipine 21 mg/hr (08/26/22 1000)   feeding supplement (PEPTAMEN 1.5 CAL) 55 mL/hr at 08/26/22 1000   fentaNYL infusion INTRAVENOUS 150 mcg/hr (08/26/22 1000)  prismasol BGK 4/2.5 1,500 mL/hr at 08/26/22 1000   propofol (DIPRIVAN) infusion 20 mcg/kg/min (08/26/22 1000)   sodium chloride (hypertonic) 75 mL/hr at 08/26/22 1000   Place/Maintain arterial line **AND** sodium chloride, alteplase, etomidate, heparin, heparin, midazolam, mouth rinse, rocuronium, sodium chloride

## 2022-08-26 NOTE — Progress Notes (Signed)
Pt transported to and from CT without incident ?

## 2022-08-26 NOTE — Progress Notes (Signed)
NAMECarlton Ross, MRN:  [id number], DOB:  May 07, 1971, LOS: 2 ADMISSION DATE:  08/24/2022, CONSULTATION DATE:  08/25/2022 REFERRING MD:  [place] - Neuro CHIEF COMPLAINT:  AMS   History of Present Illness:  51 year old man who presented to [facility] ED 11/14 with left sided visual preference, left sided weakness, slurred speech. He was also profoundly hypertensive with SBP over 200s (highest documented is SBP 230). PMHx significant for HTN, T2DM, ESRD on HD (MWF).  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 He was intubated in the ED and neurosurgery was consulted for consideration of EVD.  He was admitted by neurology and PCCM was called in consultation for vent management.  Pertinent Medical History:   Past Medical History:  Diagnosis Date   Anemia    CKD (chronic kidney disease)    on wed 12/09/21   Diabetes mellitus without complication ([place])    type 2   Hypertension    Renal disorder    Significant Hospital Events: Including procedures, antibiotic start and stop dates in addition to other pertinent events   11/15 - Admitted overnight. 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  CTA Head/Neck without e/o vascular lesion at R cerebellar hemorrhage. HTS 3% initiated. Trialysis catheter placed L IJ for CRRT/HTS 23% (per Stroke team). CRRT initiated. 11/16 Low grade temp with Tmax 99.4, WBC uptrending 25.9 (9.0). CXR with increased RLL opacities. Cefepime initiated. Resp Cx/TA sent. TG elevated, weaning prop.  Interim History / Subjective:  No significant events overnight Ongoing LG temps with Tmax 99.23F WBC with swift uptrend, 25.9 (9.0) CXR with increased RLL opacities from prior O2 sats 92% on 50% FiO2 Cefepime initiated and Resp Cx sent TG uptrending, weaning prop as  able  Objective:  Blood pressure (!) 142/66, pulse 80, temperature 99.1 F (37.3 C), temperature source Esophageal, resp. rate 16, height '5\' 5"'$  (1.651 m), weight 80.4 kg, SpO2 97 %.    Vent Mode: PRVC FiO2 (%):  [40 %-50 %] 50 % Set Rate:  [16 bmp] 16 bmp Vt Set:  [490 mL] 490 mL PEEP:  [5 cmH20] 5 cmH20 Plateau Pressure:  [20 cmH20-22 cmH20] 22 cmH20   Intake/Output Summary (Last 24 hours) at 08/26/2022 0824 Last data filed at 08/26/2022 0800 Gross per 24 hour  Intake 4334.17 ml  Output 2709.7 ml  Net 1624.47 ml    Filed Weights   08/24/22 2334 08/25/22 1300 08/26/22 0500  Weight: 80.4 kg 81.5 kg 80.4 kg   Physical Examination: General: Acutely ill-appearing middle-aged man in NAD. +Shivering HEENT: [place]/AT, anicteric sclera, PERRL 2.20m, moist mucous membranes. ETT/Cortrak in place. Neuro: Sedated. Nonresponsive in the setting of sedation. Withdraws to pain in all 4 extremities. Not following commands. No spontaneous movement of extremities witnessed on my exam. +Corneal, +Cough, and +Gag  CV: RRR, no m/g/r. PULM: Breathing even and unlabored on vent (PEEP 5, FiO2 50%). Lung fields with increased rhonchi, R > L. GI: Soft, nontender, nondistended. Normoactive bowel sounds. Extremities: No LE edema noted. Skin: Warm/dry, no rashes.  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 CTA head 11/14 > no e/o vascular lesion at R cerebellar hemorrhage  Assessment & Plan:  Acute IPH with severe mass effect/brain swelling - presumed hypertensive in nature - Stroke team primary, appreciate management - NSGY consulted, no role for surgical intervention due to relatively small size of hematoma - Goal SBP 130-150 - Cleviprex titrated to goal SBP - HTS 3% for mass effection, 23% bolus PRN for goal Na - Goal Na 150-155 - PT/OT/SLP when able  to participate in care  Hypertensive emergency - hx of HTN, unsure of compliance with med regimen - Goal SBP 130-150 - Cleviprex for goal SBP - Hold home amlodipine, Coref, Hydral, Imdur, losartan; resume as clinically appropriate to assist in Cleviprex weaning  Acute hypoxic respiratory failure with inability to protect the airway - s/p intubation in ED ?PNA RLL - Continue full vent support (4-8cc/kg IBW) - Wean FiO2 for O2 sat > 90% - Daily WUA/SBT as sedation weaning permits - VAP bundle - Pulmonary hygiene - PAD protocol for sedation: Propofol, Fentanyl, and Versed for goal RASS 0 to -1; wean Prop as able given uptrending triglycerides - Follow CXR - Cefepime initiated for ?RLL PNA, uptrending WBC - F/u Resp Cx/TA 11/16  Hx ESRD on HD MWF (last session Mon 11/14) Permissive hypernatremia - Nephrology consulted, appreciate recs - CRRT per Nephro via [facility] Trialysis - Trend BMP, permissive hyperNa - Replete electrolytes as indicated - Monitor I&Os - Avoid nephrotoxic agents as able  Hx DM - SSI - Hold home basal insulin for now, resume as appropriate  At risk for malnutrition - S/p Cortrak 11/15 - TF per Nutrition team  Best practice (evaluated daily):  Diet/type: NPO DVT prophylaxis: not indicated GI prophylaxis: PPI Lines: N/A Foley:  N/A Code Status:  full code Last date of multidisciplinary goals of care discussion: Per primary.  Critical care time: 37 minutes   The patient is critically ill with multiple organ system failure and requires high complexity decision making for assessment and support, frequent evaluation and titration of therapies, advanced monitoring, review of radiographic studies and interpretation of complex data.   Critical Care Time devoted to patient care services, exclusive of separately billable procedures, described in this note is 37 minutes.  Lestine Mount, PA-C [facility] 08/26/22 8:24 AM  Please see  [email or web address] for pager details.  From 7A-7P if no response, please call [phone] After hours, please call ELink [phone]

## 2022-08-26 NOTE — Progress Notes (Signed)
STROKE TEAM PROGRESS NOTE   INTERVAL HISTORY No family is at the bedside. Pt RN at bedside. Pt still intubated on sedation. Currently he is sedated and not responsive. Repeat CT overnight stable hematoma and no hydrocephalus. Na 148. On CRRT and 3% saline.    Vitals:   08/26/22 0700 08/26/22 0800 08/26/22 0825 08/26/22 0826  BP: 136/63 (!) 142/66    Pulse: 82 80 82   Resp: '16 16 20   '$ Temp: 99.1 F (37.3 C) 99.1 F (37.3 C) 99.3 F (37.4 C)   TempSrc:  Esophageal    SpO2: 98% 97% 92% 93%  Weight:      Height:       CBC:  Recent Labs  Lab 08/20/22 0318 08/24/22 2330 08/25/22 0151 08/25/22 0440 08/26/22 0410  WBC 11.7* 9.3  --  9.0 25.9*  NEUTROABS 9.3* 6.3  --   --   --   HGB 8.7* 9.2*  10.5*   < > 7.7* 8.5*  HCT 27.1* 29.5*  31.0*   < > 24.4* 27.2*  MCV 65.9* 66.1*  --  65.8* 68.3*  PLT 309 280  --  230 247   < > = values in this interval not displayed.   Basic Metabolic Panel:  Recent Labs  Lab 08/25/22 1621 08/25/22 1854 08/26/22 0410 08/26/22 0547  NA 146*   < > 141 148*  K 4.2  --  4.8  --   CL 107  --  108  --   CO2 26  --  23  --   GLUCOSE 114*  --  165*  --   BUN 43*  --  37*  --   CREATININE 10.69*  --  7.87*  --   CALCIUM 7.9*  --  7.7*  --   MG 2.3  --  2.4  --   PHOS 5.3*  5.3*  --  3.6  --    < > = values in this interval not displayed.   Lipid Panel:  Recent Labs  Lab 08/26/22 0410  CHOL 186  TRIG 372*  355*  HDL 21*  CHOLHDL 8.9  VLDL 74*  LDLCALC 91   HgbA1c:  Recent Labs  Lab 08/25/22 0231  HGBA1C 5.9*   Urine Drug Screen: No results for input(s): "LABOPIA", "COCAINSCRNUR", "LABBENZ", "AMPHETMU", "THCU", "LABBARB" in the last 168 hours.  Alcohol Level  Recent Labs  Lab 08/24/22 2330  ETH <10    IMAGING past 24 hours Portable Chest xray  Result Date: 08/26/2022 CLINICAL DATA:  Respiratory failure EXAM: PORTABLE CHEST 1 VIEW COMPARISON:  08/25/2022 FINDINGS: Endotracheal tube terminates 2.0 cm above the carina.  Enteric tube courses below the diaphragm with distal tip beyond the inferior margin of the film. Dual lumen left IJ central venous catheter remains positioned at the superior cavoatrial junction. Stable cardiomegaly. Pulmonary vascular congestion. Persistent streaky bibasilar opacities. No large pleural fluid collection. No pneumothorax. IMPRESSION: 1. Cardiomegaly with pulmonary vascular congestion. 2. Persistent streaky bibasilar opacities, atelectasis versus infiltrate. Electronically Signed   By: Davina Poke D.O.   On: 08/26/2022 08:06   CT HEAD WO CONTRAST (5MM)  Result Date: 08/26/2022 CLINICAL DATA:  Hemorrhagic stroke EXAM: CT HEAD WITHOUT CONTRAST TECHNIQUE: Contiguous axial images were obtained from the base of the skull through the vertex without intravenous contrast. RADIATION DOSE REDUCTION: This exam was performed according to the departmental dose-optimization program which includes automated exposure control, adjustment of the mA and/or kV according to patient size and/or use of iterative  reconstruction technique. COMPARISON:  08/25/2022 at 5:42 a.m. FINDINGS: Brain: Unchanged size of intraparenchymal hematoma in the right cerebellum with mass effect on the fourth ventricle. No hydrocephalus. There is periventricular hypoattenuation compatible with chronic microvascular disease. Generalized volume loss. Vascular: No abnormal hyperdensity of the major intracranial arteries or dural venous sinuses. No intracranial atherosclerosis. Skull: The visualized skull base, calvarium and extracranial soft tissues are normal. Sinuses/Orbits: No fluid levels or advanced mucosal thickening of the visualized paranasal sinuses. No mastoid or middle ear effusion. The orbits are normal. IMPRESSION: Unchanged size of intraparenchymal hematoma in the right cerebellum with mass effect on the fourth ventricle. No hydrocephalus. Electronically Signed   By: Ulyses Jarred M.D.   On: 08/26/2022 00:21   DG Chest  Port 1 View  Result Date: 08/25/2022 CLINICAL DATA:  Central line placement. EXAM: PORTABLE CHEST 1 VIEW COMPARISON:  Chest 08/25/2022 FINDINGS: Endotracheal tube has been advanced now 1 cm above the carina. Feeding tube enters the stomach with the tip at the gastric antrum or duodenal bulb. Left jugular dual lumen catheter extends into the SVC at the cavoatrial junction. No pneumothorax. Cardiac enlargement with mild vascular congestion unchanged. Mild bibasilar atelectasis unchanged. IMPRESSION: 1. Left jugular catheter tip at the cavoatrial junction. No pneumothorax. 2. Endotracheal tube 1 cm above the carina. 3. Mild bibasilar atelectasis unchanged. 4. Feeding tube tip in the region of the gastric antrum or duodenal bulb. Electronically Signed   By: Franchot Gallo M.D.   On: 08/25/2022 12:57   ECHOCARDIOGRAM COMPLETE  Result Date: 08/25/2022    ECHOCARDIOGRAM REPORT   Patient Name:   Taevyn Hausen Date of Exam: 08/25/2022 Medical Rec #:  [id number]         Height:       65.0 in Accession #:    [id number]        Weight:       177.2 lb Date of Birth:  1971/03/30        BSA:          1.879 m Patient Age:    51 years          BP:           136/59 mmHg Patient Gender: M                 HR:           62 bpm. Exam Location:  Inpatient Procedure: 2D Echo, Cardiac Doppler and Color Doppler Indications:    Stroke  History:        Patient has no prior history of Echocardiogram examinations.                 Risk Factors:Diabetes and Hypertension. ESRD.  Sonographer:    Clayton Lefort RDCS (AE) Referring Phys: Rosalin Hawking  Sonographer Comments: Echo performed with patient supine and on artificial respirator and suboptimal subcostal window. IMPRESSIONS  1. Left ventricular ejection fraction, by estimation, is 50 to 55%. The left ventricle has low normal function. The left ventricle has no regional wall motion abnormalities. There is moderate left ventricular hypertrophy. Left ventricular diastolic parameters are  consistent with Grade I diastolic dysfunction (impaired relaxation).  2. Right ventricular systolic function is normal. The right ventricular size is normal.  3. The mitral valve is normal in structure. No evidence of mitral valve regurgitation. No evidence of mitral stenosis.  4. The aortic valve is normal in structure. Aortic valve regurgitation is trivial. No aortic stenosis is present.  5. The  inferior vena cava is normal in size with greater than 50% respiratory variability, suggesting right atrial pressure of 3 mmHg. Conclusion(s)/Recommendation(s): No intracardiac source of embolism detected on this transthoracic study. Consider a transesophageal echocardiogram to exclude cardiac source of embolism if clinically indicated. FINDINGS  Left Ventricle: Left ventricular ejection fraction, by estimation, is 50 to 55%. The left ventricle has low normal function. The left ventricle has no regional wall motion abnormalities. The left ventricular internal cavity size was normal in size. There is moderate left ventricular hypertrophy. Left ventricular diastolic parameters are consistent with Grade I diastolic dysfunction (impaired relaxation). Right Ventricle: The right ventricular size is normal. No increase in right ventricular wall thickness. Right ventricular systolic function is normal. Left Atrium: Left atrial size was normal in size. Right Atrium: Right atrial size was normal in size. Pericardium: Trivial pericardial effusion is present. The pericardial effusion is posterior and lateral to the left ventricle. Mitral Valve: The mitral valve is normal in structure. No evidence of mitral valve regurgitation. No evidence of mitral valve stenosis. Tricuspid Valve: The tricuspid valve is normal in structure. Tricuspid valve regurgitation is trivial. No evidence of tricuspid stenosis. Aortic Valve: The aortic valve is normal in structure. Aortic valve regurgitation is trivial. No aortic stenosis is present. Aortic valve  mean gradient measures 2.0 mmHg. Aortic valve peak gradient measures 3.7 mmHg. Aortic valve area, by VTI measures 3.76 cm. Pulmonic Valve: The pulmonic valve was normal in structure. Pulmonic valve regurgitation is not visualized. No evidence of pulmonic stenosis. Aorta: The aortic root is normal in size and structure. Venous: The inferior vena cava is normal in size with greater than 50% respiratory variability, suggesting right atrial pressure of 3 mmHg. IAS/Shunts: No atrial level shunt detected by color flow Doppler.  LEFT VENTRICLE PLAX 2D LV PW:         1.88 cm   Diastology LV IVS:        1.36 cm   LV e' medial:    3.81 cm/s LVOT diam:     2.20 cm   LV E/e' medial:  17.5 LV SV:         69        LV e' lateral:   5.55 cm/s LV SV Index:   37        LV E/e' lateral: 12.0 LVOT Area:     3.80 cm  RIGHT VENTRICLE             IVC RV Basal diam:  2.80 cm     IVC diam: 1.80 cm RV S prime:     15.10 cm/s TAPSE (M-mode): 2.2 cm LEFT ATRIUM             Index        RIGHT ATRIUM           Index LA Vol (A2C):   42.8 ml 22.78 ml/m  RA Area:     10.80 cm LA Vol (A4C):   44.0 ml 23.41 ml/m  RA Volume:   17.70 ml  9.42 ml/m LA Biplane Vol: 45.9 ml 24.43 ml/m  AORTIC VALVE AV Area (Vmax):    3.67 cm AV Area (Vmean):   3.39 cm AV Area (VTI):     3.76 cm AV Vmax:           95.80 cm/s AV Vmean:          62.200 cm/s AV VTI:            0.183 m  AV Peak Grad:      3.7 mmHg AV Mean Grad:      2.0 mmHg LVOT Vmax:         92.40 cm/s LVOT Vmean:        55.400 cm/s LVOT VTI:          0.181 m LVOT/AV VTI ratio: 0.99  AORTA Ao Root diam: 3.50 cm Ao Asc diam:  3.70 cm MITRAL VALVE MV Area (PHT): 2.48 cm    SHUNTS MV Decel Time: 306 msec    Systemic VTI:  0.18 m MV E velocity: 66.50 cm/s  Systemic Diam: 2.20 cm MV A velocity: 75.90 cm/s MV E/A ratio:  0.88 Candee Furbish MD Electronically signed by Candee Furbish MD Signature Date/Time: 08/25/2022/12:17:53 PM    Final     PHYSICAL EXAM  Temp:  [97.7 F (36.5 C)-100 F (37.8 C)] 99.3  F (37.4 C) (11/16 0825) Pulse Rate:  [62-89] 82 (11/16 0825) Resp:  [16-20] 20 (11/16 0825) BP: (130-176)/(55-76) 142/66 (11/16 0800) SpO2:  [85 %-100 %] 93 % (11/16 0826) Arterial Line BP: (131-178)/(54-74) 141/57 (11/16 0800) FiO2 (%):  [40 %-50 %] 50 % (11/16 0826) Weight:  [80.4 kg-81.5 kg] 80.4 kg (11/16 0500)  General - Well nourished, well developed, intubated on sedation.  Ophthalmologic - fundi not visualized due to noncooperation.  Cardiovascular - Regular rate and rhythm.  Neuro - intubated on sedation, eyes closed, not following commands. With forced eye opening, both eye in mid position, not blinking to visual threat, doll's eyes absent, not tracking, PERRL. Corneal reflex present, gag and cough present. Breathing over the vent.  Facial symmetry not able to test due to ET tube.  Tongue protrusion not cooperative. Limited spontaneous movement of LUE but not against gravity, no significant movement in other extremities. Sensation, coordination and gait not tested.     ASSESSMENT/PLAN Mr. Ridgely Anastacio is a 51 y.o. male with history of end-stage renal disease on intermittent hemodialysis, diabetes, hypertension presented with  witnessed onset slurred speech, with headache at 10:30 PM for which EMS was activated.  On their arrival they report blood pressure was 260/140, glucose 126   ICH: Acute right cerebellar small ICH, likely due to chronic uncontrolled hypertension  Code Stroke CT head Acute intraparenchymal hematoma centered in the right cerebellar hemisphere with severe mass effect on the fourth ventricle and cerebral aqueduct. No hydrocephalus. CT repeat - hemorrhage size stable from earlier today. There is likely extension into the fourth ventricle but no intraventricular spread or hydrocephalus. CT head and neck premature atherosclerosis with notable vessel tortuosity suggesting chronic hypertension CT repeat 11/16 stable hematoma and no hydrocephalus Will consider  MRI with and without contrast once stable 2D Echo EF 50 to 55% LDL 91 HgbA1c 5.9 VTE prophylaxis - SCD's No antithrombotic prior to admission, now on No antithrombotic.  Therapy recommendations:  pending Disposition:  pending  Cerebral Edema On 3% saline protocol  Last NA 143->148->148, with NA goal 150-155  3% @ 50cc/hr ->100cc Serial NA checks Q4h -> 23.4% if needed On CRRT  NSGY following - no surgical intervention at this time   ESRD on HD HD MWF Renal Dr. Jonnie Finner following, appreciate help Had HD catheter, on CRRT   Continue 3% saline with 23.4% PRN  Acute hypoxic Respiratory failure  Vent management per CCM  On Fentanyl and prop for sedation  Versed as needed for agitation on vent  Hypertension Home meds:  norvasc 10 mg, coreg 25 mg, imdur 30 mg, losartan  100 mg- on hold  UnStable Cleviprex still ongoing Resume home norvasc and coreg SBP goal < 160 BP goal normotensive  Hyperlipidemia Home meds:  none LDL 91 goal < 70 Consider statin at discharge  Diabetes type II Controlled Home meds:  Semglee HgbA1c 5.9, goal < 7.0 CBGs SSI  dysphagia  OG tube and cortrak placed [facility] Acute Care day # 2   Rosalin Hawking, MD PhD Stroke Neurology 08/26/2022 10:21 AM  This patient is critically ill due to cerebellar ICH, cerebral edema, respiratory failure, ESRD, hypertensive emergency and at significant risk of neurological worsening, death form hematoma expansion, brain herniation, obstructive hydrocephalus, seizure. This patient's care requires constant monitoring of vital signs, hemodynamics, respiratory and cardiac monitoring, review of multiple databases, neurological assessment, discussion with family, other specialists and medical decision making of high complexity. I spent 35 minutes of neurocritical care time in the care of this patient.  I discussed with CCM Dr. Lake Bells and nephrology Dr. Jonnie Finner.    To contact Stroke Continuity provider, please refer  to [email or web address] After hours, contact General Neurology

## 2022-08-26 NOTE — Progress Notes (Signed)
Next Na+ was 146, got another bolus of 24.3% saline. I will dc the post-filter fluids as it is likely having a diluting effect w/ Na+ 140 at 400 cc/hr.  If we need to we can increase the 3% by increments because, since we are balancing all the fluids in and out with CRRT, there will be no increase in fluid volume no matter what rate we give. Will defer to neurology on 3% rate.    Kelly Splinter, MD 08/26/2022, 3:22 PM  Recent Labs  Lab 08/25/22 0440 08/25/22 1621 08/26/22 0410  HGB 7.7*  --  8.5*  ALBUMIN  --  2.7* 2.7*  CALCIUM 8.3* 7.9* 7.7*  PHOS 6.8* 5.3*  5.3* 3.6  CREATININE 11.68* 10.69* 7.87*  K 4.9 4.2 4.8

## 2022-08-26 NOTE — Progress Notes (Signed)
  Transition of Care [facility]) Screening Note   Patient Details  Name: Olusegun Rosenfield Date of Birth: 1971-05-27   [facility]) CM/SW Contact:    Benard Halsted, LCSW Phone Number: 08/26/2022, 11:54 AM    Transition of Care Department [facility]) has reviewed patient. We will continue to monitor patient advancement through interdisciplinary progression rounds. If new patient transition needs arise, please place a TOC consult.

## 2022-08-26 NOTE — Progress Notes (Signed)
Pharmacy Antibiotic Note  Randy Ross is a 51 y.o. male admitted on 08/24/2022 now concern for pneumonia.  CXR (11/15): streak bibasilar opacities - atelectasis vs infiltrate. WBC up 25.9, Tmax 99.3.  Pharmacy has been consulted for cefepime dosing.  Plan: Cefepime 2g IV q12h on CRRT F/u RRT plans  F/u culture data, clinical picture  Height: '5\' 5"'$  (165.1 cm) Weight: 80.4 kg (177 lb 4 oz) IBW/kg (Calculated) : 61.5  Temp (24hrs), Avg:99.2 F (37.3 C), Min:97.7 F (36.5 C), Max:100 F (37.8 C)  Recent Labs  Lab 08/20/22 0318 08/24/22 2330 08/25/22 0231 08/25/22 0440 08/25/22 1621 08/26/22 0410  WBC 11.7* 9.3  --  9.0  --  25.9*  CREATININE 10.63* 11.10*  12.10*  --  11.68* 10.69* 7.87*  LATICACIDVEN  --   --  1.0 0.9  --   --     Estimated Creatinine Clearance: 10.9 mL/min (A) (by C-G formula based on SCr of 7.87 mg/dL (H)).    Allergies  Allergen Reactions   Gabapentin Shortness Of Breath and Rash    Scratchy throat    Metformin And Related Other (See Comments)    Affecting kidney function     Antimicrobials this admission: Cefepime 11/16>   Dose adjustments this admission:   Microbiology results: 11/15 MRSA neg  Thank you for allowing pharmacy to be a part of this patient's care.  Wilson Singer, PharmD Clinical Pharmacist 08/26/2022 9:12 AM

## 2022-08-26 NOTE — Progress Notes (Signed)
Subjective: Patient underwent CT head last night  Objective: Vital signs in last 24 hours: Temp:  [97.7 F (36.5 C)-100 F (37.8 C)] 99.1 F (37.3 C) (11/16 0800) Pulse Rate:  [59-89] 80 (11/16 0800) Resp:  [16-20] 16 (11/16 0800) BP: (130-176)/(55-76) 142/66 (11/16 0800) SpO2:  [85 %-100 %] 97 % (11/16 0800) Arterial Line BP: (121-178)/(54-74) 141/57 (11/16 0800) FiO2 (%):  [40 %-50 %] 50 % (11/16 0355) Weight:  [80.4 kg-81.5 kg] 80.4 kg (11/16 0500)  Intake/Output from previous day: 11/15 0701 - 11/16 0700 In: 4126 [I.V.:3031; NG/GT:915; IV Piggyback:30] Out: 2543.7  Intake/Output this shift: Total I/O In: 208.2 [I.V.:134.2; NG/GT:55; IV Piggyback:19] Out: 166   Intubated Eyes closed, pupils 3 mm reacting to 2 mm bilaterally Localizes in uppers briskly, w/d in lowers  Lab Results: Recent Labs    08/25/22 0440 08/26/22 0410  WBC 9.0 25.9*  HGB 7.7* 8.5*  HCT 24.4* 27.2*  PLT 230 247   BMET Recent Labs    08/25/22 1621 08/25/22 1854 08/26/22 0410 08/26/22 0547  NA 146*   < > 141 148*  K 4.2  --  4.8  --   CL 107  --  108  --   CO2 26  --  23  --   GLUCOSE 114*  --  165*  --   BUN 43*  --  37*  --   CREATININE 10.69*  --  7.87*  --   CALCIUM 7.9*  --  7.7*  --    < > = values in this interval not displayed.    Studies/Results: Portable Chest xray  Result Date: 08/26/2022 CLINICAL DATA:  Respiratory failure EXAM: PORTABLE CHEST 1 VIEW COMPARISON:  08/25/2022 FINDINGS: Endotracheal tube terminates 2.0 cm above the carina. Enteric tube courses below the diaphragm with distal tip beyond the inferior margin of the film. Dual lumen left IJ central venous catheter remains positioned at the superior cavoatrial junction. Stable cardiomegaly. Pulmonary vascular congestion. Persistent streaky bibasilar opacities. No large pleural fluid collection. No pneumothorax. IMPRESSION: 1. Cardiomegaly with pulmonary vascular congestion. 2. Persistent streaky bibasilar  opacities, atelectasis versus infiltrate. Electronically Signed   By: Davina Poke D.O.   On: 08/26/2022 08:06   CT HEAD WO CONTRAST (5MM)  Result Date: 08/26/2022 CLINICAL DATA:  Hemorrhagic stroke EXAM: CT HEAD WITHOUT CONTRAST TECHNIQUE: Contiguous axial images were obtained from the base of the skull through the vertex without intravenous contrast. RADIATION DOSE REDUCTION: This exam was performed according to the departmental dose-optimization program which includes automated exposure control, adjustment of the mA and/or kV according to patient size and/or use of iterative reconstruction technique. COMPARISON:  08/25/2022 at 5:42 a.m. FINDINGS: Brain: Unchanged size of intraparenchymal hematoma in the right cerebellum with mass effect on the fourth ventricle. No hydrocephalus. There is periventricular hypoattenuation compatible with chronic microvascular disease. Generalized volume loss. Vascular: No abnormal hyperdensity of the major intracranial arteries or dural venous sinuses. No intracranial atherosclerosis. Skull: The visualized skull base, calvarium and extracranial soft tissues are normal. Sinuses/Orbits: No fluid levels or advanced mucosal thickening of the visualized paranasal sinuses. No mastoid or middle ear effusion. The orbits are normal. IMPRESSION: Unchanged size of intraparenchymal hematoma in the right cerebellum with mass effect on the fourth ventricle. No hydrocephalus. Electronically Signed   By: Ulyses Jarred M.D.   On: 08/26/2022 00:21   DG Chest Port 1 View  Result Date: 08/25/2022 CLINICAL DATA:  Central line placement. EXAM: PORTABLE CHEST 1 VIEW COMPARISON:  Chest 08/25/2022 FINDINGS:  Endotracheal tube has been advanced now 1 cm above the carina. Feeding tube enters the stomach with the tip at the gastric antrum or duodenal bulb. Left jugular dual lumen catheter extends into the SVC at the cavoatrial junction. No pneumothorax. Cardiac enlargement with mild vascular  congestion unchanged. Mild bibasilar atelectasis unchanged. IMPRESSION: 1. Left jugular catheter tip at the cavoatrial junction. No pneumothorax. 2. Endotracheal tube 1 cm above the carina. 3. Mild bibasilar atelectasis unchanged. 4. Feeding tube tip in the region of the gastric antrum or duodenal bulb. Electronically Signed   By: Franchot Gallo M.D.   On: 08/25/2022 12:57   ECHOCARDIOGRAM COMPLETE  Result Date: 08/25/2022    ECHOCARDIOGRAM REPORT   Patient Name:   Randy Ross Date of Exam: 08/25/2022 Medical Rec #:  [id number]         Height:       65.0 in Accession #:    [id number]        Weight:       177.2 lb Date of Birth:  1970/12/03        BSA:          1.879 m Patient Age:    51 years          BP:           136/59 mmHg Patient Gender: M                 HR:           62 bpm. Exam Location:  Inpatient Procedure: 2D Echo, Cardiac Doppler and Color Doppler Indications:    Stroke  History:        Patient has no prior history of Echocardiogram examinations.                 Risk Factors:Diabetes and Hypertension. ESRD.  Sonographer:    Clayton Lefort RDCS (AE) Referring Phys: Rosalin Hawking  Sonographer Comments: Echo performed with patient supine and on artificial respirator and suboptimal subcostal window. IMPRESSIONS  1. Left ventricular ejection fraction, by estimation, is 50 to 55%. The left ventricle has low normal function. The left ventricle has no regional wall motion abnormalities. There is moderate left ventricular hypertrophy. Left ventricular diastolic parameters are consistent with Grade I diastolic dysfunction (impaired relaxation).  2. Right ventricular systolic function is normal. The right ventricular size is normal.  3. The mitral valve is normal in structure. No evidence of mitral valve regurgitation. No evidence of mitral stenosis.  4. The aortic valve is normal in structure. Aortic valve regurgitation is trivial. No aortic stenosis is present.  5. The inferior vena cava is normal in size  with greater than 50% respiratory variability, suggesting right atrial pressure of 3 mmHg. Conclusion(s)/Recommendation(s): No intracardiac source of embolism detected on this transthoracic study. Consider a transesophageal echocardiogram to exclude cardiac source of embolism if clinically indicated. FINDINGS  Left Ventricle: Left ventricular ejection fraction, by estimation, is 50 to 55%. The left ventricle has low normal function. The left ventricle has no regional wall motion abnormalities. The left ventricular internal cavity size was normal in size. There is moderate left ventricular hypertrophy. Left ventricular diastolic parameters are consistent with Grade I diastolic dysfunction (impaired relaxation). Right Ventricle: The right ventricular size is normal. No increase in right ventricular wall thickness. Right ventricular systolic function is normal. Left Atrium: Left atrial size was normal in size. Right Atrium: Right atrial size was normal in size. Pericardium: Trivial pericardial effusion is present. The pericardial effusion  is posterior and lateral to the left ventricle. Mitral Valve: The mitral valve is normal in structure. No evidence of mitral valve regurgitation. No evidence of mitral valve stenosis. Tricuspid Valve: The tricuspid valve is normal in structure. Tricuspid valve regurgitation is trivial. No evidence of tricuspid stenosis. Aortic Valve: The aortic valve is normal in structure. Aortic valve regurgitation is trivial. No aortic stenosis is present. Aortic valve mean gradient measures 2.0 mmHg. Aortic valve peak gradient measures 3.7 mmHg. Aortic valve area, by VTI measures 3.76 cm. Pulmonic Valve: The pulmonic valve was normal in structure. Pulmonic valve regurgitation is not visualized. No evidence of pulmonic stenosis. Aorta: The aortic root is normal in size and structure. Venous: The inferior vena cava is normal in size with greater than 50% respiratory variability, suggesting right  atrial pressure of 3 mmHg. IAS/Shunts: No atrial level shunt detected by color flow Doppler.  LEFT VENTRICLE PLAX 2D LV PW:         1.88 cm   Diastology LV IVS:        1.36 cm   LV e' medial:    3.81 cm/s LVOT diam:     2.20 cm   LV E/e' medial:  17.5 LV SV:         69        LV e' lateral:   5.55 cm/s LV SV Index:   37        LV E/e' lateral: 12.0 LVOT Area:     3.80 cm  RIGHT VENTRICLE             IVC RV Basal diam:  2.80 cm     IVC diam: 1.80 cm RV S prime:     15.10 cm/s TAPSE (M-mode): 2.2 cm LEFT ATRIUM             Index        RIGHT ATRIUM           Index LA Vol (A2C):   42.8 ml 22.78 ml/m  RA Area:     10.80 cm LA Vol (A4C):   44.0 ml 23.41 ml/m  RA Volume:   17.70 ml  9.42 ml/m LA Biplane Vol: 45.9 ml 24.43 ml/m  AORTIC VALVE AV Area (Vmax):    3.67 cm AV Area (Vmean):   3.39 cm AV Area (VTI):     3.76 cm AV Vmax:           95.80 cm/s AV Vmean:          62.200 cm/s AV VTI:            0.183 m AV Peak Grad:      3.7 mmHg AV Mean Grad:      2.0 mmHg LVOT Vmax:         92.40 cm/s LVOT Vmean:        55.400 cm/s LVOT VTI:          0.181 m LVOT/AV VTI ratio: 0.99  AORTA Ao Root diam: 3.50 cm Ao Asc diam:  3.70 cm MITRAL VALVE MV Area (PHT): 2.48 cm    SHUNTS MV Decel Time: 306 msec    Systemic VTI:  0.18 m MV E velocity: 66.50 cm/s  Systemic Diam: 2.20 cm MV A velocity: 75.90 cm/s MV E/A ratio:  0.88 Candee Furbish MD Electronically signed by Candee Furbish MD Signature Date/Time: 08/25/2022/12:17:53 PM    Final    CT ANGIO HEAD NECK W WO CM (CODE STROKE)  Result Date: 08/25/2022 CLINICAL  DATA:  Acute neuro deficit with stroke suspected. EXAM: CT ANGIOGRAPHY HEAD AND NECK TECHNIQUE: Multidetector CT imaging of the head and neck was performed using the standard protocol during bolus administration of intravenous contrast. Multiplanar CT image reconstructions and MIPs were obtained to evaluate the vascular anatomy. Carotid stenosis measurements (when applicable) are obtained utilizing NASCET criteria, using  the distal internal carotid diameter as the denominator. RADIATION DOSE REDUCTION: This exam was performed according to the departmental dose-optimization program which includes automated exposure control, adjustment of the mA and/or kV according to patient size and/or use of iterative reconstruction technique. CONTRAST:  89m OMNIPAQUE IOHEXOL 350 MG/ML SOLN COMPARISON:  Head CT from yesterday FINDINGS: CT HEAD FINDINGS Brain: Ovoid hematoma in the deep right cerebellum measuring 3.3 cm in length by 2.5 cm in maximal thickness, extending towards and likely into the fourth ventricle (triangular shape on sagittal reformats) but no intraventricular spread. Rim of adjacent edema without hydrocephalus. No visible infarct or mass. Vascular: See below Skull: Negative Sinuses/Orbits: Right cataract resection. Review of the MIP images confirms the above findings CTA NECK FINDINGS Aortic arch: No acute finding Right carotid system: Atheromatous plaque at the bifurcation without stenosis or ulceration. Left carotid system: Mild atheromatous plaque at the bifurcation. No stenosis or ulceration. Vertebral arteries: No proximal subclavian stenosis. The vertebral arteries are smoothly contoured and diffusely patent. Skeleton: Spinal curvature without acute finding Other neck: Tongue distortion from endotracheal tube. Upper chest: Atelectasis seen in the apical lungs Review of the MIP images confirms the above findings CTA HEAD FINDINGS Anterior circulation: Atheromatous calcification along the carotid siphons without flow reducing stenosis or aneurysm. Posterior circulation: Vertebral and basilar tortuosity with atheromatous calcification. No branch occlusion, beading, aneurysm, or vascular malformation. No spot sign Venous sinuses: Unremarkable for the arterial phase Anatomic variants: None significant Review of the MIP images confirms the above findings IMPRESSION: 1. No vascular lesion or spot sign seen at the right cerebellar  hemorrhage. The hemorrhage is size stable from earlier today. There is likely extension into the fourth ventricle but no intraventricular spread or hydrocephalus. 2. Premature atherosclerosis with notable vessel tortuosity suggesting chronic hypertension. Electronically Signed   By: JJorje GuildM.D.   On: 08/25/2022 06:10   CT HEAD CODE STROKE WO CONTRAST  Addendum Date: 08/25/2022   ADDENDUM REPORT: 08/25/2022 02:33 ADDENDUM: Critical Value/emergent results were called by telephone at the time of interpretation on 08/24/2022 at 11:58 pm to [facility] who verbally acknowledged these results. Electronically Signed   By: KUlyses JarredM.D.   On: 08/25/2022 02:33   Result Date: 08/25/2022 CLINICAL DATA:  Code stroke.  Acute neurologic deficits EXAM: CT HEAD WITHOUT CONTRAST TECHNIQUE: Contiguous axial images were obtained from the base of the skull through the vertex without intravenous contrast. RADIATION DOSE REDUCTION: This exam was performed according to the departmental dose-optimization program which includes automated exposure control, adjustment of the mA and/or kV according to patient size and/or use of iterative reconstruction technique. COMPARISON:  None Available. FINDINGS: Brain: There is an acute intraparenchymal hematoma centered in the right cerebellar hemisphere measuring 2.6 x 1.8 cm with a craniocaudal dimension of 2.2 cm (volume equals 5.1 mL). There is severe mass effect on the fourth ventricle and cerebral aqueduct. No hydrocephalus. There is periventricular hypoattenuation compatible with chronic microvascular disease. Vascular: No abnormal hyperdensity of the major intracranial arteries or dural venous sinuses. No intracranial atherosclerosis. Skull: The visualized skull base, calvarium and extracranial soft tissues are normal. Sinuses/Orbits: No fluid  levels or advanced mucosal thickening of the visualized paranasal sinuses. No mastoid or middle ear effusion. The  orbits are normal. IMPRESSION: Acute intraparenchymal hematoma centered in the right cerebellar hemisphere with severe mass effect on the fourth ventricle and cerebral aqueduct. No hydrocephalus. Electronically Signed: By: Ulyses Jarred M.D. On: 08/24/2022 23:42   DG Chest Port 1 View  Result Date: 08/25/2022 CLINICAL DATA:  Respiratory failure EXAM: PORTABLE CHEST 1 VIEW COMPARISON:  05/19/2022 FINDINGS: Endotracheal tube is in place 6.2 cm above the carina with its tip at the level of the clavicular heads. Lung volumes are small and there is right basilar atelectasis. No pneumothorax or pleural effusion. Mild cardiomegaly is present. Trace perihilar interstitial pulmonary edema noted. No acute bone abnormality. IMPRESSION: 1. Endotracheal tube at the thoracic inlet, 6.2 cm above the carina. 2. Pulmonary hypoinflation. 3. Trace perihilar interstitial pulmonary edema, possibly cardiogenic in nature. 4. Mild cardiomegaly. Electronically Signed   By: Fidela Salisbury M.D.   On: 08/25/2022 00:43    Assessment/Plan: 51 yo M with ESRD, hypertensive R cerebellar hemorrhage - repeat CT head stable - no neurosurgical intervention at this time   Vallarie Mare 08/26/2022, 8:33 AM

## 2022-08-27 ENCOUNTER — Inpatient Hospital Stay (HOSPITAL_COMMUNITY): Payer: No Typology Code available for payment source

## 2022-08-27 LAB — CBC
HCT: 21.2 % — ABNORMAL LOW (ref 39.0–52.0)
HCT: 22.1 % — ABNORMAL LOW (ref 39.0–52.0)
HCT: 25.3 % — ABNORMAL LOW (ref 39.0–52.0)
Hemoglobin: 6.3 g/dL — CL (ref 13.0–17.0)
Hemoglobin: 6.5 g/dL — CL (ref 13.0–17.0)
Hemoglobin: 8.4 g/dL — ABNORMAL LOW (ref 13.0–17.0)
MCH: 20.8 pg — ABNORMAL LOW (ref 26.0–34.0)
MCH: 21.1 pg — ABNORMAL LOW (ref 26.0–34.0)
MCH: 23.9 pg — ABNORMAL LOW (ref 26.0–34.0)
MCHC: 29.7 g/dL — ABNORMAL LOW (ref 30.0–36.0)
MCHC: 29.4 g/dL — ABNORMAL LOW (ref 30.0–36.0)
MCHC: 33.2 g/dL (ref 30.0–36.0)
MCV: 70 fL — ABNORMAL LOW (ref 80.0–100.0)
MCV: 71.8 fL — ABNORMAL LOW (ref 80.0–100.0)
MCV: 72.1 fL — ABNORMAL LOW (ref 80.0–100.0)
Platelets: 173 10*3/uL (ref 150–400)
Platelets: 152 10*3/uL (ref 150–400)
Platelets: 157 10*3/uL (ref 150–400)
RBC: 3.03 MIL/uL — ABNORMAL LOW (ref 4.22–5.81)
RBC: 3.08 MIL/uL — ABNORMAL LOW (ref 4.22–5.81)
RBC: 3.51 MIL/uL — ABNORMAL LOW (ref 4.22–5.81)
RDW: 21 % — ABNORMAL HIGH (ref 11.5–15.5)
RDW: 21.4 % — ABNORMAL HIGH (ref 11.5–15.5)
RDW: 20.9 % — ABNORMAL HIGH (ref 11.5–15.5)
WBC: 19.3 10*3/uL — ABNORMAL HIGH (ref 4.0–10.5)
WBC: 19.4 10*3/uL — ABNORMAL HIGH (ref 4.0–10.5)
WBC: 15.5 10*3/uL — ABNORMAL HIGH (ref 4.0–10.5)
nRBC: 2.6 % — ABNORMAL HIGH (ref 0.0–0.2)
nRBC: 2.7 % — ABNORMAL HIGH (ref 0.0–0.2)
nRBC: 4.2 % — ABNORMAL HIGH (ref 0.0–0.2)

## 2022-08-27 LAB — RENAL FUNCTION PANEL
Albumin: 2.1 g/dL — ABNORMAL LOW (ref 3.5–5.0)
Anion gap: 8 (ref 5–15)
BUN: 29 mg/dL — ABNORMAL HIGH (ref 6–20)
CO2: 24 mmol/L (ref 22–32)
Calcium: 7.8 mg/dL — ABNORMAL LOW (ref 8.9–10.3)
Chloride: 116 mmol/L — ABNORMAL HIGH (ref 98–111)
Creatinine, Ser: 4.8 mg/dL — ABNORMAL HIGH (ref 0.61–1.24)
GFR, Estimated: 14 mL/min — ABNORMAL LOW (ref >60)
Glucose, Bld: 254 mg/dL — ABNORMAL HIGH (ref 70–99)
Phosphorus: 3.5 mg/dL (ref 2.5–4.6)
Potassium: 5.5 mmol/L — ABNORMAL HIGH (ref 3.5–5.1)
Sodium: 148 mmol/L — ABNORMAL HIGH (ref 135–145)

## 2022-08-27 LAB — RETICULOCYTES
Immature Retic Fract: 23.4 % — ABNORMAL HIGH (ref 2.3–15.9)
RBC.: 3.26 MIL/uL — ABNORMAL LOW (ref 4.22–5.81)
Retic Count, Absolute: 33.9 10*3/uL (ref 19.0–186.0)
Retic Ct Pct: 1 % (ref 0.4–3.1)

## 2022-08-27 LAB — GLUCOSE, CAPILLARY
Glucose-Capillary: 244 mg/dL — ABNORMAL HIGH (ref 70–99)
Glucose-Capillary: 208 mg/dL — ABNORMAL HIGH (ref 70–99)
Glucose-Capillary: 211 mg/dL — ABNORMAL HIGH (ref 70–99)
Glucose-Capillary: 142 mg/dL — ABNORMAL HIGH (ref 70–99)
Glucose-Capillary: 147 mg/dL — ABNORMAL HIGH (ref 70–99)
Glucose-Capillary: 146 mg/dL — ABNORMAL HIGH (ref 70–99)

## 2022-08-27 LAB — HEPATITIS B SURFACE ANTIGEN: Hepatitis B Surface Ag: NONREACTIVE

## 2022-08-27 LAB — PREPARE RBC (CROSSMATCH)

## 2022-08-27 LAB — HEMOGLOBIN AND HEMATOCRIT, BLOOD
HCT: 23.4 % — ABNORMAL LOW (ref 39.0–52.0)
Hemoglobin: 7.3 g/dL — ABNORMAL LOW (ref 13.0–17.0)

## 2022-08-27 LAB — SODIUM
Sodium: 150 mmol/L — ABNORMAL HIGH (ref 135–145)
Sodium: 151 mmol/L — ABNORMAL HIGH (ref 135–145)
Sodium: 150 mmol/L — ABNORMAL HIGH (ref 135–145)
Sodium: 152 mmol/L — ABNORMAL HIGH (ref 135–145)

## 2022-08-27 LAB — MAGNESIUM: Magnesium: 2.1 mg/dL (ref 1.7–2.4)

## 2022-08-27 LAB — LACTATE DEHYDROGENASE: LDH: 159 U/L (ref 98–192)

## 2022-08-27 MED ORDER — SODIUM CHLORIDE 0.9% IV SOLUTION: Freq: Once | INTRAVENOUS | Status: AC

## 2022-08-27 MED ORDER — NOREPINEPHRINE 4 MG/250ML-% IV SOLN
0.0000 ug/min | INTRAVENOUS | Status: DC
  Administered 2022-08-27: 2 ug/min via INTRAVENOUS
  Filled 2022-08-27: qty 250

## 2022-08-27 MED ORDER — SODIUM CHLORIDE 0.9 % IV SOLN
1.0000 g | INTRAVENOUS | Status: DC
  Administered 2022-08-27 – 2022-08-28 (×2): 1 g via INTRAVENOUS
  Filled 2022-08-27 (×3): qty 10

## 2022-08-27 MED ORDER — FENTANYL BOLUS VIA INFUSION
50.0000 ug | INTRAVENOUS | Status: DC | PRN
  Administered 2022-08-27: 100 ug via INTRAVENOUS
  Administered 2022-08-27: 50 ug via INTRAVENOUS
  Administered 2022-08-27: 100 ug via INTRAVENOUS
  Administered 2022-08-28: 50 ug via INTRAVENOUS
  Administered 2022-08-28: 100 ug via INTRAVENOUS
  Administered 2022-08-29 – 2022-08-30 (×5): 50 ug via INTRAVENOUS
  Administered 2022-09-01 – 2022-09-07 (×10): 100 ug via INTRAVENOUS
  Administered 2022-09-08 (×3): 50 ug via INTRAVENOUS
  Administered 2022-09-08: 100 ug via INTRAVENOUS

## 2022-08-27 MED ORDER — INSULIN ASPART 100 UNIT/ML IJ SOLN
0.0000 [IU] | INTRAMUSCULAR | Status: DC
  Administered 2022-08-27: 2 [IU] via SUBCUTANEOUS
  Administered 2022-08-27: 5 [IU] via SUBCUTANEOUS
  Administered 2022-08-27 (×2): 2 [IU] via SUBCUTANEOUS
  Administered 2022-08-28: 3 [IU] via SUBCUTANEOUS
  Administered 2022-08-28: 5 [IU] via SUBCUTANEOUS
  Administered 2022-08-28: 3 [IU] via SUBCUTANEOUS
  Administered 2022-08-28: 5 [IU] via SUBCUTANEOUS
  Administered 2022-08-28: 3 [IU] via SUBCUTANEOUS
  Administered 2022-08-29: 2 [IU] via SUBCUTANEOUS
  Administered 2022-08-29 (×2): 3 [IU] via SUBCUTANEOUS
  Administered 2022-08-29: 2 [IU] via SUBCUTANEOUS
  Administered 2022-08-30: 5 [IU] via SUBCUTANEOUS
  Administered 2022-08-30 (×2): 2 [IU] via SUBCUTANEOUS
  Administered 2022-08-30 (×3): 3 [IU] via SUBCUTANEOUS
  Administered 2022-08-30: 2 [IU] via SUBCUTANEOUS
  Administered 2022-08-31: 5 [IU] via SUBCUTANEOUS
  Administered 2022-08-31: 3 [IU] via SUBCUTANEOUS
  Administered 2022-08-31: 2 [IU] via SUBCUTANEOUS
  Administered 2022-08-31: 5 [IU] via SUBCUTANEOUS
  Administered 2022-09-01: 3 [IU] via SUBCUTANEOUS
  Administered 2022-09-01: 5 [IU] via SUBCUTANEOUS
  Administered 2022-09-01: 3 [IU] via SUBCUTANEOUS
  Administered 2022-09-01: 2 [IU] via SUBCUTANEOUS
  Administered 2022-09-02 (×5): 3 [IU] via SUBCUTANEOUS
  Administered 2022-09-02: 2 [IU] via SUBCUTANEOUS
  Administered 2022-09-03: 5 [IU] via SUBCUTANEOUS
  Administered 2022-09-03: 3 [IU] via SUBCUTANEOUS
  Administered 2022-09-03: 2 [IU] via SUBCUTANEOUS
  Administered 2022-09-03: 3 [IU] via SUBCUTANEOUS
  Administered 2022-09-03: 5 [IU] via SUBCUTANEOUS
  Administered 2022-09-03: 2 [IU] via SUBCUTANEOUS
  Administered 2022-09-03 – 2022-09-04 (×2): 3 [IU] via SUBCUTANEOUS
  Administered 2022-09-04: 2 [IU] via SUBCUTANEOUS
  Administered 2022-09-04: 5 [IU] via SUBCUTANEOUS
  Administered 2022-09-04 – 2022-09-05 (×7): 3 [IU] via SUBCUTANEOUS
  Administered 2022-09-05: 2 [IU] via SUBCUTANEOUS
  Administered 2022-09-06 (×2): 3 [IU] via SUBCUTANEOUS
  Administered 2022-09-06: 2 [IU] via SUBCUTANEOUS
  Administered 2022-09-06 (×2): 3 [IU] via SUBCUTANEOUS
  Administered 2022-09-07: 2 [IU] via SUBCUTANEOUS
  Administered 2022-09-07: 3 [IU] via SUBCUTANEOUS
  Administered 2022-09-07: 5 [IU] via SUBCUTANEOUS
  Administered 2022-09-07 (×2): 3 [IU] via SUBCUTANEOUS
  Administered 2022-09-07 – 2022-09-08 (×3): 2 [IU] via SUBCUTANEOUS
  Administered 2022-09-08 (×2): 3 [IU] via SUBCUTANEOUS
  Administered 2022-09-09: 5 [IU] via SUBCUTANEOUS
  Administered 2022-09-09 (×2): 3 [IU] via SUBCUTANEOUS
  Administered 2022-09-09: 5 [IU] via SUBCUTANEOUS
  Administered 2022-09-09: 2 [IU] via SUBCUTANEOUS
  Administered 2022-09-09: 3 [IU] via SUBCUTANEOUS
  Administered 2022-09-10: 2 [IU] via SUBCUTANEOUS
  Administered 2022-09-10: 3 [IU] via SUBCUTANEOUS
  Administered 2022-09-10: 2 [IU] via SUBCUTANEOUS
  Administered 2022-09-10: 3 [IU] via SUBCUTANEOUS
  Administered 2022-09-10: 2 [IU] via SUBCUTANEOUS
  Administered 2022-09-11 (×4): 3 [IU] via SUBCUTANEOUS

## 2022-08-27 MED ORDER — SODIUM CHLORIDE 0.9 % IV SOLN: 1.0000 g | INTRAVENOUS | Status: DC

## 2022-08-27 MED ORDER — CHLORHEXIDINE GLUCONATE CLOTH 2 % EX PADS
6.0000 | MEDICATED_PAD | Freq: Every day | CUTANEOUS | Status: DC
  Administered 2022-08-28 – 2022-08-30 (×2): 6 via TOPICAL

## 2022-08-27 MED ORDER — INSULIN ASPART 100 UNIT/ML IJ SOLN
5.0000 [IU] | INTRAMUSCULAR | Status: DC
  Administered 2022-08-27 (×4): 5 [IU] via SUBCUTANEOUS

## 2022-08-27 NOTE — Progress Notes (Signed)
Patient transported from room 4N26, to CT, and back to room 4N26 (on ventilator) without complications.

## 2022-08-27 NOTE — Progress Notes (Addendum)
STROKE TEAM PROGRESS NOTE   INTERVAL HISTORY Stop CRRT today, use 23.4% HTS if needed. Na goal 148-152. Head CT ordered for today. Hgb 6.3 today CCM ordering 1uPRBC.    Vitals:   08/27/22 1315 08/27/22 1330 08/27/22 1345 08/27/22 1400  BP:    (!) 128/57  Pulse: 69 70 67 70  Resp: '17 15 18 17  '$ Temp: 99 F (37.2 C) 99 F (37.2 C) 99 F (37.2 C) 99 F (37.2 C)  TempSrc:      SpO2: 100% 100% 100% 100%  Weight:      Height:       CBC:  Recent Labs  Lab 08/24/22 2330 08/25/22 0151 08/26/22 0410 08/27/22 0545 08/27/22 1327  WBC 9.3   < > 25.9* 19.3*  --   NEUTROABS 6.3  --   --   --   --   HGB 9.2*  10.5*   < > 8.5* 6.3* 7.3*  HCT 29.5*  31.0*   < > 27.2* 21.2* 23.4*  MCV 66.1*   < > 68.3* 70.0*  --   PLT 280   < > 247 173  --    < > = values in this interval not displayed.   Basic Metabolic Panel:  Recent Labs  Lab 08/26/22 1410 08/26/22 1742 08/26/22 2130 08/27/22 0142 08/27/22 0545  NA 150* 147*  149*   < > 150* 148*  K  --  5.1  --   --  5.5*  CL  --  119*  --   --  116*  CO2  --  23  --   --  24  GLUCOSE  --  231*  --   --  254*  BUN  --  30*  --   --  29*  CREATININE  --  5.64*  --   --  4.80*  CALCIUM  --  7.6*  --   --  7.8*  MG 2.3  --   --  2.1  --   PHOS 3.3 2.5  --   --  3.5   < > = values in this interval not displayed.   Lipid Panel:  Recent Labs  Lab 08/26/22 0410  CHOL 186  TRIG 372*  355*  HDL 21*  CHOLHDL 8.9  VLDL 74*  LDLCALC 91   HgbA1c:  Recent Labs  Lab 08/25/22 0231  HGBA1C 5.9*   Urine Drug Screen: No results for input(s): "LABOPIA", "COCAINSCRNUR", "LABBENZ", "AMPHETMU", "THCU", "LABBARB" in the last 168 hours.  Alcohol Level  Recent Labs  Lab 08/24/22 2330  ETH <10    IMAGING past 24 hours CT HEAD WO CONTRAST (5MM)  Result Date: 08/27/2022 CLINICAL DATA:  Stroke follow-up EXAM: CT HEAD WITHOUT CONTRAST TECHNIQUE: Contiguous axial images were obtained from the base of the skull through the vertex without  intravenous contrast. RADIATION DOSE REDUCTION: This exam was performed according to the departmental dose-optimization program which includes automated exposure control, adjustment of the mA and/or kV according to patient size and/or use of iterative reconstruction technique. COMPARISON:  Two days ago FINDINGS: Brain: Unchanged hematoma and edema in the right cerebellum approaching the fourth ventricle which is partially carotid inferiorly. No hydrocephalus. Low-density new from admission head CT beneath the right cingulate gyrus and at the right corpus callosum. Vascular: Atheromatous calcification. Skull: No acute or aggressive finding Sinuses/Orbits: Generalized sinus inflammation in the setting of intubation. IMPRESSION: 1. Small acute infarct at the right corpus callosum body and cingulate white matter, not  seen on admission head CT. 2. Non progressed right cerebellar hematoma and swelling. No hydrocephalus. Electronically Signed   By: Jorje Guild M.D.   On: 08/27/2022 12:09   DG CHEST PORT 1 VIEW  Result Date: 08/27/2022 CLINICAL DATA:  Hypoxia EXAM: PORTABLE CHEST 1 VIEW COMPARISON:  Radiograph 08/26/2022 FINDINGS: Endotracheal tube overlies the midthoracic trachea approximately 3.4 cm above the carina. Left neck approach catheter tip overlies the distal SVC. Feeding tube passes below the diaphragm, tip excluded by collimation. Unchanged enlarged cardiac silhouette. There are mild interstitial opacities. Improved aeration of the left lung base. There is no pleural effusion or pneumothorax. Bones are unchanged. IMPRESSION: Cardiomegaly with mild pulmonary edema. Improved aeration of the left lung base. Electronically Signed   By: Maurine Simmering M.D.   On: 08/27/2022 08:38    PHYSICAL EXAM  Temp:  [98.4 F (36.9 C)-100.8 F (38.2 C)] 99 F (37.2 C) (11/17 1400) Pulse Rate:  [65-97] 70 (11/17 1400) Resp:  [7-22] 17 (11/17 1400) BP: (97-169)/(46-75) 128/57 (11/17 1400) SpO2:  [90 %-100 %] 100 %  (11/17 1400) Arterial Line BP: (96-158)/(45-61) 124/50 (11/17 1400) FiO2 (%):  [40 %-60 %] 40 % (11/17 1137) Weight:  [81.5 kg] 81.5 kg (11/17 0500)  General - Well nourished, well developed, intubated on sedation.  Ophthalmologic - fundi not visualized due to noncooperation.  Cardiovascular - Regular rate and rhythm.  Neuro - intubated on sedation, eyes closed, not following commands. With forced eye opening, both eye in mid position, not blinking to visual threat, doll's eyes absent, not tracking, PERRL. Corneal reflex present, gag and cough present. Breathing over the vent.  Facial symmetry not able to test due to ET tube.  Tongue protrusion not cooperative. Limited spontaneous movement of LUE but not against gravity, no significant movement in other extremities. Sensation, coordination and gait not tested.     ASSESSMENT/PLAN Mr. Facundo Allemand is a 51 y.o. male with history of end-stage renal disease on intermittent hemodialysis, diabetes, hypertension presented with  witnessed onset slurred speech, with headache at 10:30 PM for which EMS was activated.  On their arrival they report blood pressure was 260/140, glucose 126   ICH: Acute right cerebellar small ICH, likely due to chronic uncontrolled hypertension  Code Stroke CT head Acute intraparenchymal hematoma centered in the right cerebellar hemisphere with severe mass effect on the fourth ventricle and cerebral aqueduct. No hydrocephalus. CT repeat - hemorrhage size stable from earlier today. There is likely extension into the fourth ventricle but no intraventricular spread or hydrocephalus. CT head and neck premature atherosclerosis with notable vessel tortuosity suggesting chronic hypertension CT repeat 11/16 stable hematoma and no hydrocephalus CT Head 11/17 1. Small acute infarct at the right corpus callosum body and cingulate white matter, not seen on admission head CT. 2. Non progressed right cerebellar hematoma and  swelling. No hydrocephalus. Will consider MRI with and without contrast once stable 2D Echo EF 50 to 55% LDL 91 HgbA1c 5.9 VTE prophylaxis - SCD's No antithrombotic prior to admission, now on No antithrombotic.  Therapy recommendations:  pending Disposition:  pending  Cerebral Edema On 3% saline protocol -> off now due to fluid status Last NA 143->148->148, with NA goal 150 Serial NA checks Q4h -> 23.4% PRN to keep Na goal around 150 NSGY following - no surgical intervention at this time   ESRD on HD HD [facility] Renal Dr. Jonnie Finner following, appreciate help Had HD catheter CRRT stopped 11/17 due to severe anemia -> transition to HD HTS  with 23.4% boluses PRN  Acute hypoxic Respiratory failure  Vent management per CCM  On Fentanyl and prop for sedation  Versed as needed for agitation on vent Not candidate for extubation  Hypertension Home meds:  norvasc 10 mg, coreg 25 mg, imdur 30 mg, losartan 100 mg- on hold  UnStable Cleviprex still ongoing Resume home norvasc and coreg SBP goal < 160 BP goal normotensive  Hyperlipidemia Home meds:  none LDL 91 goal < 70 Consider statin at discharge  Diabetes type II Controlled Home meds:  Semglee HgbA1c 5.9, goal < 7.0 CBGs SSI  dysphagia  OG tube and cortrak placed [facility] day # 3   Patient seen and examined by NP/APP with MD. MD to update note as needed.   Janine Ores, DNP, FNP-BC [facility] Pager: [phone]  ATTENDING NOTE: I reviewed above note and agree with the assessment and plan. Pt was seen and examined.   No family at the bedside. Pt was still intubated and heavily sedated due to agitation. Not responsive during exam. Repeat CT showed stable hematoma and no hydrocephalus. Severe anemia this am due to frequent clogging of CRRT filter/memebrane, will give PRBC but will stop CRRT. We will d/c 3% saline due to fluid status, and change to 23.4% PRN Q4h based on Na levels. Appreciate  CCM and nephrology assistance.   For detailed assessment and plan, please refer to above/below as I have made changes wherever appropriate.   Rosalin Hawking, MD PhD Stroke Neurology 08/27/2022 11:23 PM  This patient is critically ill due to cerebellar ICH, cerebral edema, respiratory failure, ESRD, hypertensive emergency and at significant risk of neurological worsening, death form hematoma expansion, brain herniation, obstructive hydrocephalus, seizure. This patient's care requires constant monitoring of vital signs, hemodynamics, respiratory and cardiac monitoring, review of multiple databases, neurological assessment, discussion with family, other specialists and medical decision making of high complexity. I spent 35 minutes of neurocritical care time in the care of this patient.  I discussed with CCM Dr. Lake Bells and nephrology Dr. Jonnie Finner.   To contact Stroke Continuity provider, please refer to [email or web address] After hours, contact General Neurology

## 2022-08-27 NOTE — Progress Notes (Signed)
NAMEKaedon Ross, MRN:  [id number], DOB:  December 26, 1970, LOS: 3 ADMISSION DATE:  08/24/2022, CONSULTATION DATE:  08/25/2022 REFERRING MD:  Randy Ross - Neuro CHIEF COMPLAINT:  AMS   History of Present Illness:  51 year old man who presented to [facility] ED 11/14 with left sided visual preference, left sided weakness, slurred speech. He was also profoundly hypertensive with SBP over 200s (highest documented is SBP 230). PMHx significant for HTN, T2DM, ESRD on HD (MWF).  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 He was intubated in the ED and neurosurgery was consulted for consideration of EVD.  He was admitted by neurology and PCCM was called in consultation for vent management.  Pertinent Medical History:   Past Medical History:  Diagnosis Date   Anemia    CKD (chronic kidney disease)    on wed 12/09/21   Diabetes mellitus without complication ([place])    type 2   Hypertension    Renal disorder    Significant Hospital Events: Including procedures, antibiotic start and stop dates in addition to other pertinent events   11/15 - Admitted overnight. 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  CTA Head/Neck without e/o vascular lesion at R cerebellar hemorrhage. HTS 3% initiated. Trialysis catheter placed L IJ for CRRT/HTS 23% (per Stroke team). CRRT initiated. 11/16 Low grade temp with Tmax 99.4, WBC uptrending 25.9 (9.0). CXR with increased RLL opacities. Cefepime initiated. Resp Cx/TA sent. TG elevated, weaning prop. 11/17 Ongoing low grade temps Tmax 100.73F. WBC downtrending 19.3 (25.9). CRRT filter clotting causing blood loss, Hgb 6.3. CRRT stopped, 1U PRBCs transfused. HD to resume. Repeat CT Head with small acute infarct at R corpus callosum body/cingulate white matter, stable R  cerebellar hematoma. CXR improved on cefepime.  Interim History / Subjective:  No significant events overnight CRRT filter clotting this AM, causing hemoglobin drift Hgb 6.3, 1U PRBCs ordered CRRT stopped, resume HD per Nephro Ongoing low grade temps (Tmax 100.73F) WBC improved with cefepime initiation CXR slightly improved, ongoing mild pulm edema Repeat CT Head with small acute infarct at R corpus callosum; stable R cerebellar hematoma HTS 3% stopped, continuing 23% boluses as needed to maintain Na goal  Objective:  Blood pressure 126/61, pulse 75, temperature 99 F (37.2 C), resp. rate 15, height '5\' 5"'$  (1.651 m), weight 81.5 kg, SpO2 100 %.    Vent Mode: PRVC FiO2 (%):  [50 %-60 %] 60 % Set Rate:  [16 bmp] 16 bmp Vt Set:  [490 mL] 490 mL PEEP:  [5 cmH20] 5 cmH20 Plateau Pressure:  [9 cmH20-22 cmH20] 9 cmH20   Intake/Output Summary (Last 24 hours) at 08/27/2022 0756 Last data filed at 08/27/2022 0700 Gross per 24 hour  Intake 4805.48 ml  Output 4040 ml  Net 765.48 ml    Filed Weights   08/25/22 1300 08/26/22 0500 08/27/22 0500  Weight: 81.5 kg 80.4 kg 81.5 kg   Physical Examination: General: Acutely ill-appearing middle-aged man in NAD. HEENT: [place]/AT, anicteric sclera, PERRL 71m, moist mucous membranes. ETT/Cortrak in place. Neuro: Sedated. Withdraws to pain in BLE, RUE, less response in LUE. Not following commands. No spontaneous movement of extremities noted on my exam. +Gag, weak cough.  CV: RRR, no m/g/r. PULM: Breathing even and unlabored on vent (PEEP 5, FiO2 40%). Lung fields improved from  prior, diminished at bilateral bases, coarse in upper fields. Thick, malodorous secretions in in-line suction. GI: Soft, nontender, nondistended. Normoactive bowel sounds. Extremities: No LE edema noted. Skin: Warm/dry, no rashes.  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 - Stroke team primary, appreciate management - NSGY consulted, no role for surgical intervention due to relatively small size of hematoma - Goal SBP 130-150 - Cleviprex titrated to goal SBP - HTS 3% stopped in the setting of volume concerns/CRRT discontinuation - Continue 23% boluses to Na goal, goal 150-155 - Additional brain imaging per Neuro/Stroke - PT/OT/SLP when able to participate in care  Hypertensive emergency - hx of HTN, unsure of compliance with med regimen - Goal SBP 130-150 - Cleviprex titrated to goal SBP - Hold home antihypertensives; resume as clinically appropriate to assist in [place] weaning  Acute hypoxic respiratory failure with inability to protect the airway - s/p intubation in ED ?PNA RLL - Continue full vent support (4-8cc/kg IBW) - Wean FiO2 for O2 sat > 90% - Daily WUA/SBT as mental status/sedation weaning permits - VAP bundle - Pulmonary hygiene - PAD protocol for sedation: Propofol, Fentanyl, and Versed for goal RASS 0 to -1; minimize prop as able given uptrending TG - Follow CXR - Continue cefepime for RLL PNA, uptrending WBC - F/u finalized Resp Cx/TA 11/16; moderate GNRs, moderate gram positive cocci  Hx ESRD on HD MWF (last session Mon 11/14) Permissive hypernatremia - Nephrology consulted, appreciate assistance - Stop CRRT given filter clotting issues, resume HD per usual schedule - Trend BMP, permissive hyperNa - Replete electrolytes as indicated - Monitor I&Os - Avoid nephrotoxic agents as able  Anemia 2/2 acute blood loss associated with CRRT - Trend H&H - Monitor for signs of active bleeding - Transfuse  for Hgb < 7.0 or hemodynamically significant bleeding - Hgb 6.3 11/17, 1U PRBCs transfused  Hx DM - SSI - Hold home basal insulin for now - Goal CBG 140-180 - CBGs Q4H  At risk for malnutrition - S/p Cortrak 11/15 - TF per Nutrition  Best practice (evaluated daily):  Diet/type: NPO DVT prophylaxis: not indicated GI prophylaxis: PPI Lines: N/A Foley:  N/A Code Status:  full code Last date of multidisciplinary goals of care discussion: Per primary.  Critical care time: 39 minutes   The patient is critically ill with multiple organ system failure and requires high complexity decision making for assessment and support, frequent evaluation and titration of therapies, advanced monitoring, review of radiographic studies and interpretation of complex data.   Critical Care Time devoted to patient care services, exclusive of separately billable procedures, described in this note is 39 minutes.  Lestine Mount, PA-C [facility] 08/27/22 7:56 AM  Please see [email or web address] for pager details.  From 7A-7P if no response, please call [phone] After hours, please call ELink [phone]

## 2022-08-27 NOTE — Plan of Care (Signed)
CHANGE IN DIALYSIS SCHEDULE DUE TO HOLIDAY  Randy Ross 22-Aug-1971 [id number]  Patient dialysis schedule for the week of 08/29/22-09/05/22 varies from their normal schedule due to Thanksgiving Holiday.  Need to keep this in mind for discharge planning.  The Holiday schedule is as follow:   Normal HD Schedule: Monday Wednesday Friday Thanksgiving schedule:Sunday, Tuesday, Friday  They will resume their normal schedule on 09/06/22.     Jen Mow, PA-C [facility] Pager: [phone]

## 2022-08-27 NOTE — Progress Notes (Signed)
[facility] Progress Note  Subjective: seen in ICU, Na+ 148 -150 overnight. CRRT not working well, frequent clotting, was dc'd this am.   Vitals:   08/27/22 0930 08/27/22 0931 08/27/22 0946 08/27/22 0956  BP:    (!) 140/58  Pulse: 65 66 67   Resp: '16 16 15   '$ Temp: 99 F (37.2 C) 99 F (37.2 C) 99 F (37.2 C)   TempSrc:      SpO2: 100% 100% 100%   Weight:      Height:        Exam: Gen on vent, sedated Sclera anicteric, throat w/ ETT No jvd or bruits Chest clear anterior/ lateral RRR no RG Abd soft ntnd no mass or ascites +bs Ext no UE or LE edema Neuro is on vent, sedated   LFA AVF+bruit      Home meds include - amlodipine 10, coreg 25 qd, losartan 100, hydralazine 50 tid, MVI, semglee insulin, renavite, phoslo2 ac tid, rocaltrol 0.25 qd, prns/ vits        OP HD: SW MWF 3h 53mn  80kg   400/800   2/2 bath  Hep 2000  LFA AVF - last HD 11/13, post 80.1kg - mircera 150 q2, last 11/13, due 11 27  - hectorol 4 ug IV tiw     Assessment/ Plan: 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 Goal Na+ 150-155 per neuro/ nsgy. 3% dc'd today, will get prn 24.3% saline per neurology until no longer necessary.  Uncont HTN - getting IV cleviprex gtt, goal SBP 130- 150, also getting NG hydralazine/ coreg/ norvasc.  ESRD - on HD MWF. CRRT started on 11/15 and dc'd today due to frequent clotting episodes. Plan iHF tomorrow in ICU.  VDRF - per CCM Anemia esrd - Hb 7.5-10 here, follow, transfuse as needed. Next esa due 11/27.  Volume - 1-2kg over dry wt.  MBD ckd - CCa in range and phos slightly high at 6.8. Cont IV vdra w/ hd tiw. Resume binder when eating.  DM2 - per pmd    Rob Danice Dippolito 08/27/2022, 10:07 AM   Recent Labs  Lab 08/26/22 0410 08/26/22 1410 08/26/22 1742 08/27/22 0545  HGB 8.5*  --   --  6.3*  ALBUMIN 2.7*  --  2.4* 2.1*  CALCIUM 7.7*  --  7.6* 7.8*  PHOS 3.6   < > 2.5 3.5  CREATININE 7.87*  --  5.64* 4.80*  K 4.8  --  5.1 5.5*    < > = values in this interval not displayed.    No results for input(s): "IRON", "TIBC", "FERRITIN" in the last 168 hours. Inpatient medications:  amLODipine  10 mg Per Tube Daily   carvedilol  12.5 mg Per Tube BID WC   Chlorhexidine Gluconate Cloth  6 each Topical Daily   docusate  100 mg Per Tube BID   feeding supplement (PROSource TF20)  60 mL Per Tube TID   hydrALAZINE  25 mg Per Tube Q8H   insulin aspart  0-6 Units Subcutaneous Q4H   multivitamin  1 tablet Per Tube QHS   mouth rinse  15 mL Mouth Rinse Q2H   pantoprazole (PROTONIX) IV  40 mg Intravenous Daily   polyethylene glycol  17 g Per Tube Daily    sodium chloride     ceFEPime (MAXIPIME) IV Stopped (08/26/22 2251)   clevidipine Stopped (08/27/22 0558)   feeding supplement (PEPTAMEN 1.5 CAL) 55 mL/hr at 08/27/22 0900   fentaNYL infusion INTRAVENOUS  100 mcg/hr (08/27/22 0900)   propofol (DIPRIVAN) infusion 5 mcg/kg/min (08/27/22 0934)   Place/Maintain arterial line **AND** sodium chloride, labetalol, midazolam, mouth rinse

## 2022-08-27 NOTE — Progress Notes (Signed)
Date and time results received: 08/27/22 0630 (use smartphrase ".now" to insert current time)  Test: Hemoglobin Critical Value: 6.3  Name of Provider Notified: Dr. Curly Shores  Orders Received? Or Actions Taken?: repeat CBC,  send Type/Cross

## 2022-08-27 NOTE — Progress Notes (Signed)
RT NOTE: holding on SBT on patient this AM due to RT just decreasing FIO2 to 40% from 60%.  Tolerating current ventilator settings well at this time.  Will continue to monitor.

## 2022-08-27 NOTE — Inpatient Diabetes Management (Signed)
Inpatient Diabetes Program Recommendations  AACE/ADA: New Consensus Statement on Inpatient Glycemic Control (2015)  Target Ranges:  Prepandial:   less than 140 mg/dL      Peak postprandial:   less than 180 mg/dL (1-2 hours)      Critically ill patients:  140 - 180 mg/dL   Lab Results  Component Value Date   GLUCAP 211 (H) 08/27/2022   HGBA1C 5.9 (H) 08/25/2022    Review of Glycemic Control  Latest Reference Range & Units 08/26/22 19:28 08/26/22 23:16 08/27/22 03:23 08/27/22 07:57 08/27/22 11:10  Glucose-Capillary 70 - 99 mg/dL 213 (H) 244 (H) 244 (H) 208 (H) 211 (H)  (H): Data is abnormally high Diabetes history: Type 2 DM Outpatient Diabetes medications: Semglee 10 units Qd Current orders for Inpatient glycemic control: Novolog 0-15 units Q4H  Inpatient Diabetes Program Recommendations:    Consider:  Adding Novolog 3 units Q4H for tube feed coverage (to be stopped or held in the event tube feeds are stopped)  Thanks, Bronson Curb, MSN, RNC-OB Diabetes Coordinator [phone] (8a-5p)

## 2022-08-27 NOTE — Progress Notes (Signed)
Subjective: NAEs o/n  Objective: Vital signs in last 24 hours: Temp:  [98.4 F (36.9 C)-100.8 F (38.2 C)] 99.1 F (37.3 C) (11/17 1120) Pulse Rate:  [65-97] 71 (11/17 1137) Resp:  [7-23] 18 (11/17 1137) BP: (97-169)/(46-75) 109/46 (11/17 1137) SpO2:  [89 %-100 %] 100 % (11/17 1137) Arterial Line BP: (96-158)/(45-61) 136/55 (11/17 1120) FiO2 (%):  [40 %-60 %] 40 % (11/17 1137) Weight:  [81.5 kg] 81.5 kg (11/17 0500)  Intake/Output from previous day: 11/16 0701 - 11/17 0700 In: 4805.5 [I.V.:3246; NG/GT:1320; IV Piggyback:239.5] Out: 4040  Intake/Output this shift: Total I/O In: 813.4 [I.V.:273.4; Blood:315; NG/GT:225] Out: 165.7   Eyes closed, pupils 3 mm reacting to 2 mm Intubated Localizing bilaterally  Lab Results: Recent Labs    08/26/22 0410 08/27/22 0545  WBC 25.9* 19.3*  HGB 8.5* 6.3*  HCT 27.2* 21.2*  PLT 247 173   BMET Recent Labs    08/26/22 1742 08/26/22 2130 08/27/22 0142 08/27/22 0545  NA 147*  149*   < > 150* 148*  K 5.1  --   --  5.5*  CL 119*  --   --  116*  CO2 23  --   --  24  GLUCOSE 231*  --   --  254*  BUN 30*  --   --  29*  CREATININE 5.64*  --   --  4.80*  CALCIUM 7.6*  --   --  7.8*   < > = values in this interval not displayed.    Studies/Results: CT HEAD WO CONTRAST (5MM)  Result Date: 08/27/2022 CLINICAL DATA:  Stroke follow-up EXAM: CT HEAD WITHOUT CONTRAST TECHNIQUE: Contiguous axial images were obtained from the base of the skull through the vertex without intravenous contrast. RADIATION DOSE REDUCTION: This exam was performed according to the departmental dose-optimization program which includes automated exposure control, adjustment of the mA and/or kV according to patient size and/or use of iterative reconstruction technique. COMPARISON:  Two days ago FINDINGS: Brain: Unchanged hematoma and edema in the right cerebellum approaching the fourth ventricle which is partially carotid inferiorly. No hydrocephalus. Low-density  new from admission head CT beneath the right cingulate gyrus and at the right corpus callosum. Vascular: Atheromatous calcification. Skull: No acute or aggressive finding Sinuses/Orbits: Generalized sinus inflammation in the setting of intubation. IMPRESSION: 1. Small acute infarct at the right corpus callosum body and cingulate white matter, not seen on admission head CT. 2. Non progressed right cerebellar hematoma and swelling. No hydrocephalus. Electronically Signed   By: Jorje Guild M.D.   On: 08/27/2022 12:09   DG CHEST PORT 1 VIEW  Result Date: 08/27/2022 CLINICAL DATA:  Hypoxia EXAM: PORTABLE CHEST 1 VIEW COMPARISON:  Radiograph 08/26/2022 FINDINGS: Endotracheal tube overlies the midthoracic trachea approximately 3.4 cm above the carina. Left neck approach catheter tip overlies the distal SVC. Feeding tube passes below the diaphragm, tip excluded by collimation. Unchanged enlarged cardiac silhouette. There are mild interstitial opacities. Improved aeration of the left lung base. There is no pleural effusion or pneumothorax. Bones are unchanged. IMPRESSION: Cardiomegaly with mild pulmonary edema. Improved aeration of the left lung base. Electronically Signed   By: Maurine Simmering M.D.   On: 08/27/2022 08:38   Portable Chest xray  Result Date: 08/26/2022 CLINICAL DATA:  Respiratory failure EXAM: PORTABLE CHEST 1 VIEW COMPARISON:  08/25/2022 FINDINGS: Endotracheal tube terminates 2.0 cm above the carina. Enteric tube courses below the diaphragm with distal tip beyond the inferior margin of the film. Dual lumen left  IJ central venous catheter remains positioned at the superior cavoatrial junction. Stable cardiomegaly. Pulmonary vascular congestion. Persistent streaky bibasilar opacities. No large pleural fluid collection. No pneumothorax. IMPRESSION: 1. Cardiomegaly with pulmonary vascular congestion. 2. Persistent streaky bibasilar opacities, atelectasis versus infiltrate. Electronically Signed   By:  Davina Poke D.O.   On: 08/26/2022 08:06   CT HEAD WO CONTRAST (5MM)  Result Date: 08/26/2022 CLINICAL DATA:  Hemorrhagic stroke EXAM: CT HEAD WITHOUT CONTRAST TECHNIQUE: Contiguous axial images were obtained from the base of the skull through the vertex without intravenous contrast. RADIATION DOSE REDUCTION: This exam was performed according to the departmental dose-optimization program which includes automated exposure control, adjustment of the mA and/or kV according to patient size and/or use of iterative reconstruction technique. COMPARISON:  08/25/2022 at 5:42 a.m. FINDINGS: Brain: Unchanged size of intraparenchymal hematoma in the right cerebellum with mass effect on the fourth ventricle. No hydrocephalus. There is periventricular hypoattenuation compatible with chronic microvascular disease. Generalized volume loss. Vascular: No abnormal hyperdensity of the major intracranial arteries or dural venous sinuses. No intracranial atherosclerosis. Skull: The visualized skull base, calvarium and extracranial soft tissues are normal. Sinuses/Orbits: No fluid levels or advanced mucosal thickening of the visualized paranasal sinuses. No mastoid or middle ear effusion. The orbits are normal. IMPRESSION: Unchanged size of intraparenchymal hematoma in the right cerebellum with mass effect on the fourth ventricle. No hydrocephalus. Electronically Signed   By: Ulyses Jarred M.D.   On: 08/26/2022 00:21   DG Chest Port 1 View  Result Date: 08/25/2022 CLINICAL DATA:  Central line placement. EXAM: PORTABLE CHEST 1 VIEW COMPARISON:  Chest 08/25/2022 FINDINGS: Endotracheal tube has been advanced now 1 cm above the carina. Feeding tube enters the stomach with the tip at the gastric antrum or duodenal bulb. Left jugular dual lumen catheter extends into the SVC at the cavoatrial junction. No pneumothorax. Cardiac enlargement with mild vascular congestion unchanged. Mild bibasilar atelectasis unchanged. IMPRESSION: 1.  Left jugular catheter tip at the cavoatrial junction. No pneumothorax. 2. Endotracheal tube 1 cm above the carina. 3. Mild bibasilar atelectasis unchanged. 4. Feeding tube tip in the region of the gastric antrum or duodenal bulb. Electronically Signed   By: Franchot Gallo M.D.   On: 08/25/2022 12:57    Assessment/Plan: 51 yo M with ESRD, hypertensive R cerebellar hemorrhage -  continue supportive care   Vallarie Mare 08/27/2022, 12:38 PM

## 2022-08-27 NOTE — Progress Notes (Signed)
Attending:    Subjective: CRRT filter keeps clotting off Remains mechanically ventilated  Objective: Vitals:   08/27/22 0956 08/27/22 1000 08/27/22 1015 08/27/22 1120  BP: (!) 140/58 123/63    Pulse: 68 68 71 72  Resp: '17 15 16 14  '$ Temp: 99.1 F (37.3 C) 99.1 F (37.3 C) 99.1 F (37.3 C) 99.1 F (37.3 C)  TempSrc:    Esophageal  SpO2: 100% 100% 100% 100%  Weight:      Height:       Vent Mode: PRVC FiO2 (%):  [40 %-60 %] 40 % Set Rate:  [16 bmp] 16 bmp Vt Set:  [490 mL] 490 mL PEEP:  [5 cmH20] 5 cmH20 Plateau Pressure:  [9 cmH20-22 cmH20] 18 cmH20  Intake/Output Summary (Last 24 hours) at 08/27/2022 1205 Last data filed at 08/27/2022 1120 Gross per 24 hour  Intake 4535.36 ml  Output 3554.7 ml  Net 980.66 ml    General:  In bed on vent HENT: NCAT ETT in place PULM: CTA B, vent supported breathing CV: RRR, no mgr GI: BS+, soft, nontender MSK: normal bulk and tone Neuro: sedated on vent    CBC    Component Value Date/Time   WBC 19.3 (H) 08/27/2022 0545   RBC 3.03 (L) 08/27/2022 0545   HGB 6.3 (LL) 08/27/2022 0545   HGB 10.5 (L) 08/09/2019 0827   HCT 21.2 (L) 08/27/2022 0545   PLT 173 08/27/2022 0545   PLT 396 08/09/2019 0827   MCV 70.0 (L) 08/27/2022 0545   MCH 20.8 (L) 08/27/2022 0545   MCHC 29.7 (L) 08/27/2022 0545   RDW 21.0 (H) 08/27/2022 0545   LYMPHSABS 1.6 08/24/2022 2330   MONOABS 1.2 (H) 08/24/2022 2330   EOSABS 0.2 08/24/2022 2330   BASOSABS 0.1 08/24/2022 2330    BMET    Component Value Date/Time   NA 148 (H) 08/27/2022 0545   K 5.5 (H) 08/27/2022 0545   CL 116 (H) 08/27/2022 0545   CO2 24 08/27/2022 0545   GLUCOSE 254 (H) 08/27/2022 0545   BUN 29 (H) 08/27/2022 0545   CREATININE 4.80 (H) 08/27/2022 0545   CREATININE 2.54 (H) 08/09/2019 0827   CALCIUM 7.8 (L) 08/27/2022 0545   GFRNONAA 14 (L) 08/27/2022 0545   GFRNONAA NOT CALCULATED 08/09/2019 0827   GFRAA NOT CALCULATED 08/09/2019 0827    CXR images ett in place,  interstitial edema, vascular congestion  Impression/Plan: Acute respiratory failure with hypoxemia due to inability to protect airway and pulm edema> continue full vent support, minimize volume input as able, wean sedation over weekend Acute intraparenchymal hemorrhage in cerebellum> f/u repeat CT head, no heparin products, goal BP < 140/90 ESRD> change back to iHD as he is losing blood when the CRRT filter clots and now requiring blood transfusion, discussed with NSGY, neurology and renal DM2 with hyperglycemia> add tube feeding coverage, SSI  Rest per PA note  My cc time 25 minutes  Roselie Awkward, MD [facility] [place] [facility] Pager: [phone] Cell: [phone] After 7pm: [phone]

## 2022-08-28 LAB — GLUCOSE, CAPILLARY
Glucose-Capillary: 183 mg/dL — ABNORMAL HIGH (ref 70–99)
Glucose-Capillary: 177 mg/dL — ABNORMAL HIGH (ref 70–99)
Glucose-Capillary: 98 mg/dL (ref 70–99)
Glucose-Capillary: 221 mg/dL — ABNORMAL HIGH (ref 70–99)
Glucose-Capillary: 209 mg/dL — ABNORMAL HIGH (ref 70–99)
Glucose-Capillary: 179 mg/dL — ABNORMAL HIGH (ref 70–99)

## 2022-08-28 LAB — TYPE AND SCREEN
ABO/RH(D): B POS
Antibody Screen: NEGATIVE
Unit division: 0
Unit division: 0

## 2022-08-28 LAB — CBC
HCT: 27.1 % — ABNORMAL LOW (ref 39.0–52.0)
Hemoglobin: 8.8 g/dL — ABNORMAL LOW (ref 13.0–17.0)
MCH: 23.4 pg — ABNORMAL LOW (ref 26.0–34.0)
MCHC: 32.5 g/dL (ref 30.0–36.0)
MCV: 72.1 fL — ABNORMAL LOW (ref 80.0–100.0)
Platelets: 171 10*3/uL (ref 150–400)
RBC: 3.76 MIL/uL — ABNORMAL LOW (ref 4.22–5.81)
RDW: 21.2 % — ABNORMAL HIGH (ref 11.5–15.5)
WBC: 15.5 10*3/uL — ABNORMAL HIGH (ref 4.0–10.5)
nRBC: 5.4 % — ABNORMAL HIGH (ref 0.0–0.2)

## 2022-08-28 LAB — COMPREHENSIVE METABOLIC PANEL
ALT: 14 U/L (ref 0–44)
AST: 15 U/L (ref 15–41)
Albumin: 2.1 g/dL — ABNORMAL LOW (ref 3.5–5.0)
Alkaline Phosphatase: 64 U/L (ref 38–126)
Anion gap: 10 (ref 5–15)
BUN: 59 mg/dL — ABNORMAL HIGH (ref 6–20)
CO2: 21 mmol/L — ABNORMAL LOW (ref 22–32)
Calcium: 8.5 mg/dL — ABNORMAL LOW (ref 8.9–10.3)
Chloride: 119 mmol/L — ABNORMAL HIGH (ref 98–111)
Creatinine, Ser: 7.07 mg/dL — ABNORMAL HIGH (ref 0.61–1.24)
GFR, Estimated: 9 mL/min — ABNORMAL LOW (ref >60)
Glucose, Bld: 222 mg/dL — ABNORMAL HIGH (ref 70–99)
Potassium: 6.6 mmol/L (ref 3.5–5.1)
Sodium: 150 mmol/L — ABNORMAL HIGH (ref 135–145)
Total Bilirubin: 0.5 mg/dL (ref 0.3–1.2)
Total Protein: 6.2 g/dL — ABNORMAL LOW (ref 6.5–8.1)

## 2022-08-28 LAB — BPAM RBC
Blood Product Expiration Date: 202312032359
Blood Product Expiration Date: 202312032359
ISSUE DATE / TIME: 202311170915
ISSUE DATE / TIME: 202311171617
Unit Type and Rh: 7300
Unit Type and Rh: 7300

## 2022-08-28 LAB — MAGNESIUM: Magnesium: 2.5 mg/dL — ABNORMAL HIGH (ref 1.7–2.4)

## 2022-08-28 LAB — SODIUM
Sodium: 151 mmol/L — ABNORMAL HIGH (ref 135–145)
Sodium: 144 mmol/L (ref 135–145)
Sodium: 145 mmol/L (ref 135–145)
Sodium: 147 mmol/L — ABNORMAL HIGH (ref 135–145)

## 2022-08-28 LAB — PHOSPHORUS: Phosphorus: 4.6 mg/dL (ref 2.5–4.6)

## 2022-08-28 LAB — HAPTOGLOBIN: Haptoglobin: 152 mg/dL (ref 29–370)

## 2022-08-28 LAB — HEPATITIS B SURFACE ANTIBODY, QUANTITATIVE: Hep B S AB Quant (Post): 35.1 m[IU]/mL (ref >9.9)

## 2022-08-28 MED ORDER — CARVEDILOL 12.5 MG PO TABS
25.0000 mg | ORAL_TABLET | Freq: Two times a day (BID) | ORAL | Status: DC
  Administered 2022-08-28 – 2022-09-22 (×39): 25 mg
  Filled 2022-08-28 (×41): qty 2

## 2022-08-28 MED ORDER — SODIUM CHLORIDE 23.4 % INJECTION (4 MEQ/ML) FOR IV ADMINISTRATION
120.0000 meq | Freq: Once | INTRAVENOUS | Status: AC
  Administered 2022-08-28: 120 meq via INTRAVENOUS
  Filled 2022-08-28: qty 30

## 2022-08-28 MED ORDER — AMLODIPINE BESYLATE 10 MG PO TABS
10.0000 mg | ORAL_TABLET | Freq: Every day | ORAL | Status: DC
  Administered 2022-08-28: 10 mg
  Filled 2022-08-28: qty 1

## 2022-08-28 MED ORDER — HEPARIN SODIUM (PORCINE) 1000 UNIT/ML IJ SOLN
INTRAMUSCULAR | Status: AC
  Administered 2022-08-28: 1000 [IU]
  Filled 2022-08-28: qty 4

## 2022-08-28 MED ORDER — BISACODYL 10 MG RE SUPP
10.0000 mg | Freq: Every day | RECTAL | Status: DC | PRN
  Administered 2022-09-24: 10 mg via RECTAL
  Filled 2022-08-28 (×2): qty 1

## 2022-08-28 MED ORDER — CHLORHEXIDINE GLUCONATE CLOTH 2 % EX PADS
6.0000 | MEDICATED_PAD | Freq: Every day | CUTANEOUS | Status: DC
  Administered 2022-08-28 – 2022-09-01 (×5): 6 via TOPICAL

## 2022-08-28 MED ORDER — CARVEDILOL 12.5 MG PO TABS: 25.0000 mg | ORAL_TABLET | Freq: Two times a day (BID) | ORAL | Status: DC

## 2022-08-28 MED ORDER — SODIUM CHLORIDE 0.9 % IV BOLUS
500.0000 mL | Freq: Once | INTRAVENOUS | Status: AC
  Administered 2022-08-28: 500 mL via INTRAVENOUS

## 2022-08-28 MED ORDER — ACETAMINOPHEN 160 MG/5ML PO SOLN
650.0000 mg | ORAL | Status: DC | PRN
  Administered 2022-08-28 – 2022-10-14 (×15): 650 mg
  Filled 2022-08-28 (×15): qty 20.3

## 2022-08-28 MED ORDER — SODIUM CHLORIDE 0.9 % IV SOLN: INTRAVENOUS | Status: DC | PRN

## 2022-08-28 MED ORDER — INSULIN ASPART 100 UNIT/ML IJ SOLN
5.0000 [IU] | INTRAMUSCULAR | Status: DC
  Administered 2022-08-28: 5 [IU] via SUBCUTANEOUS

## 2022-08-28 MED ORDER — INSULIN ASPART 100 UNIT/ML IJ SOLN
7.0000 [IU] | INTRAMUSCULAR | Status: DC
  Administered 2022-08-28 – 2022-09-01 (×20): 7 [IU] via SUBCUTANEOUS

## 2022-08-28 NOTE — Progress Notes (Signed)
[facility] Progress Note Patient Name: Randy Ross DOB: 05-08-1971 MRN: [id number]   Date of Service  08/28/2022  HPI/Events of Note  BP had dropped after his bath. May be sedation related as he received a 148mg bolus of fentanyl as well.  Sedation has now been turned off.  Pt now waking up, BP slowly improving.   eICU Interventions  Will give 5016mbolus. Once BP has recovered, will restart sedation.         ANSatartia1/18/2023, 10:54 PM

## 2022-08-28 NOTE — Progress Notes (Signed)
Patient report given to dialysis team. Notified they will be here shortly.

## 2022-08-28 NOTE — Progress Notes (Signed)
Patient sedated in ICU. Intubated.  Informed consent signed and in chart.   Treatment initiated: 3154 Treatment completed: 1136  Patient tolerated well.  Remains sedated. No s/s of distress.  Hand-off given to patient's nurse. Trudee Grip  Access used: left IJ HD catheter  Access issues: no  Total UF removed: 2500 ml Medication(s) given: heparin locks HD catheter Post HD VS: 99.7, 77, 12, 139/68, 100% vent  Post HD weight: 83.6 kg    [facility]

## 2022-08-28 NOTE — Progress Notes (Signed)
[facility] Progress Note Patient Name: Randy Ross DOB: 29-Jan-1971 MRN: [id number]   Date of Service  08/28/2022  HPI/Events of Note  Received request for tylenol PRN. Tmax reported at 99.5.  Pt is on Cefepime.   eICU Interventions  Tylenol ordered PRN.      Intervention Category Intermediate Interventions: Other:  Elsie Lincoln 08/28/2022, 1:02 AM

## 2022-08-28 NOTE — Progress Notes (Signed)
No new issues or problems overnight.  Patient remains deeply sedated on ventilator.  He coughs and bucks on the vent with efforts at weaning.  Afebrile.  Vital signs are stable.  Pupils 4 mm and reactive bilaterally.  Chest and abdomen benign.  Status post right cerebellar hemorrhage without evidence of obstructive hydrocephalus.  Continue supportive management.  Check follow-up head CT scan tomorrow.  If head CT scan stable without evidence of progression of hydrocephalus then plan is to work towards extubation.

## 2022-08-28 NOTE — Progress Notes (Signed)
E-link notified of patient's hypotension.

## 2022-08-28 NOTE — Progress Notes (Signed)
[facility] Progress Note  Subjective: seen in ICU, Na+ 150 and K+ 6.6. getting iHD now w/ low K bath  Vitals:   08/28/22 0945 08/28/22 1000 08/28/22 1015 08/28/22 1030  BP: 132/71 137/69 (!) 148/73 (!) 161/70  Pulse: 73 74 75 77  Resp: '11 11 17 19  '$ Temp: 99.3 F (37.4 C) 99.3 F (37.4 C) 99.3 F (37.4 C) 99.3 F (37.4 C)  TempSrc: Esophageal Esophageal Esophageal Esophageal  SpO2: 100% 100% 99% 97%  Weight:      Height:        Exam: Gen on vent, sedated Sclera anicteric, throat w/ ETT No jvd or bruits Chest clear anterior/ lateral RRR no RG Abd soft ntnd no mass or ascites +bs Ext no UE or LE edema Neuro is on vent, sedated   LFA AVF+bruit      Home meds include - amlodipine 10, coreg 25 qd, losartan 100, hydralazine 50 tid, MVI, semglee insulin, renavite, phoslo2 ac tid, rocaltrol 0.25 qd, prns/ vits        OP HD: SW MWF 3h 69mn  80kg   400/800   2/2 bath  Hep 2000  LFA AVF - last HD 11/13, post 80.1kg - mircera 150 q2, last 11/13 - hectorol 4 ug IV tiw     Assessment/ Plan: 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 Goal Na+ 150-155 per neuro/ nsgy. Getting pnr IV 24.3% saline boluses per neurology. Na+ 150 today.  Uncont HTN - getting IV cleviprex gtt, goal SBP 130- 150.  ESRD - on HD MWF. CRRT started on 11/15 and dc'd 11/17. Pt will get iHD today off schedule and also tomorrow per the holiday schedule.  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 Next esa due 11/27.  Volume - up at 5-6kg by wts. Mild edema on exam. Lower vol w/ HD tomorrow.  MBD ckd - CCa and phos in range. Cont IV vdra w/ hd tiw. Resume binder when eating.  DM2 - per pmd    Rob Randy Ross 08/28/2022, 10:39 AM   Recent Labs  Lab 08/27/22 0545 08/27/22 1327 08/27/22 1951 08/28/22 0549  HGB 6.3*   < > 8.4* 8.8*  ALBUMIN 2.1*  --   --  2.1*  CALCIUM 7.8*  --   --  8.5*  PHOS 3.5  --   --  4.6  CREATININE  4.80*  --   --  7.07*  K 5.5*  --   --  6.6*   < > = values in this interval not displayed.    No results for input(s): "IRON", "TIBC", "FERRITIN" in the last 168 hours. Inpatient medications:  Chlorhexidine Gluconate Cloth  6 each Topical Daily   Chlorhexidine Gluconate Cloth  6 each Topical Q0600   docusate  100 mg Per Tube BID   feeding supplement (PROSource TF20)  60 mL Per Tube TID   heparin sodium (porcine)       insulin aspart  0-15 Units Subcutaneous Q4H   insulin aspart  5 Units Subcutaneous Q4H   multivitamin  1 tablet Per Tube QHS   mouth rinse  15 mL Mouth Rinse Q2H   pantoprazole (PROTONIX) IV  40 mg Intravenous Daily   polyethylene glycol  17 g Per Tube Daily    sodium chloride     ceFEPime (MAXIPIME) IV Stopped (08/27/22 2020)   clevidipine Stopped (08/28/22 0210)   feeding supplement (PEPTAMEN 1.5 CAL) 55 mL/hr at  08/28/22 0800   fentaNYL infusion INTRAVENOUS 100 mcg/hr (08/28/22 0800)   norepinephrine (LEVOPHED) Adult infusion Stopped (08/27/22 1747)   propofol (DIPRIVAN) infusion 40 mcg/kg/min (08/28/22 0905)   Place/Maintain arterial line **AND** sodium chloride, acetaminophen (TYLENOL) oral liquid 160 mg/5 mL, bisacodyl, fentaNYL, heparin sodium (porcine), labetalol, midazolam, mouth rinse

## 2022-08-28 NOTE — Progress Notes (Signed)
   08/28/22 0030  Vitals  Temp 99.5 F (37.5 C)  Temp Source Esophageal   eLink team notified. RN awaiting PRN temp orders

## 2022-08-28 NOTE — Progress Notes (Addendum)
STROKE TEAM PROGRESS NOTE   INTERVAL HISTORY Dialysis today. Hgb 8.8 Cr 7 K 6.6 Continue using 23.4% HTS if needed. Na goal 148-152.  Bolus ordered -> 1000 Na 144 Resuming his norvasc and coreg, HTN throughout dialysis today.   Vitals:   08/28/22 1115 08/28/22 1130 08/28/22 1136 08/28/22 1138  BP: 132/68 120/62 135/66 139/68  Pulse: 75 78 76 76  Resp: '15 15 16 16  '$ Temp: 99.7 F (37.6 C) 99.7 F (37.6 C) 99.7 F (37.6 C) 99.7 F (37.6 C)  TempSrc: Esophageal Esophageal Esophageal Esophageal  SpO2: 100% 100% 100% 100%  Weight:    (P) 83.6 kg  Height:       CBC:  Recent Labs  Lab 08/24/22 2330 08/25/22 0151 08/27/22 1951 08/28/22 0549  WBC 9.3   < > 15.5* 15.5*  NEUTROABS 6.3  --   --   --   HGB 9.2*  10.5*   < > 8.4* 8.8*  HCT 29.5*  31.0*   < > 25.3* 27.1*  MCV 66.1*   < > 72.1* 72.1*  PLT 280   < > 157 171   < > = values in this interval not displayed.   Basic Metabolic Panel:  Recent Labs  Lab 08/27/22 0142 08/27/22 0545 08/27/22 1327 08/28/22 0549 08/28/22 0957  NA 150* 148*   < > 150* 144  K  --  5.5*  --  6.6*  --   CL  --  116*  --  119*  --   CO2  --  24  --  21*  --   GLUCOSE  --  254*  --  222*  --   BUN  --  29*  --  59*  --   CREATININE  --  4.80*  --  7.07*  --   CALCIUM  --  7.8*  --  8.5*  --   MG 2.1  --   --  2.5*  --   PHOS  --  3.5  --  4.6  --    < > = values in this interval not displayed.   Lipid Panel:  Recent Labs  Lab 08/26/22 0410  CHOL 186  TRIG 372*  355*  HDL 21*  CHOLHDL 8.9  VLDL 74*  LDLCALC 91   HgbA1c:  Recent Labs  Lab 08/25/22 0231  HGBA1C 5.9*   Urine Drug Screen: No results for input(s): "LABOPIA", "COCAINSCRNUR", "LABBENZ", "AMPHETMU", "THCU", "LABBARB" in the last 168 hours.  Alcohol Level  Recent Labs  Lab 08/24/22 2330  ETH <10    IMAGING past 24 hours CT HEAD WO CONTRAST (5MM)  Result Date: 08/27/2022 CLINICAL DATA:  Stroke follow-up EXAM: CT HEAD WITHOUT CONTRAST TECHNIQUE: Contiguous  axial images were obtained from the base of the skull through the vertex without intravenous contrast. RADIATION DOSE REDUCTION: This exam was performed according to the departmental dose-optimization program which includes automated exposure control, adjustment of the mA and/or kV according to patient size and/or use of iterative reconstruction technique. COMPARISON:  Two days ago FINDINGS: Brain: Unchanged hematoma and edema in the right cerebellum approaching the fourth ventricle which is partially carotid inferiorly. No hydrocephalus. Low-density new from admission head CT beneath the right cingulate gyrus and at the right corpus callosum. Vascular: Atheromatous calcification. Skull: No acute or aggressive finding Sinuses/Orbits: Generalized sinus inflammation in the setting of intubation. IMPRESSION: 1. Small acute infarct at the right corpus callosum body and cingulate white matter, not seen on admission head CT.  2. Non progressed right cerebellar hematoma and swelling. No hydrocephalus. Electronically Signed   By: Jorje Guild M.D.   On: 08/27/2022 12:09    PHYSICAL EXAM  Temp:  [98.2 F (36.8 C)-99.9 F (37.7 C)] 99.7 F (37.6 C) (11/18 1138) Pulse Rate:  [65-78] 76 (11/18 1138) Resp:  [11-24] 16 (11/18 1138) BP: (115-161)/(54-107) 139/68 (11/18 1138) SpO2:  [94 %-100 %] 100 % (11/18 1138) Arterial Line BP: (103-176)/(45-80) 159/64 (11/18 1030) FiO2 (%):  [40 %] 40 % (11/18 0816) Weight:  [83.6 kg-86.1 kg] (P) 83.6 kg (11/18 1138)  General - Well nourished, well developed, intubated on sedation.  Ophthalmologic - fundi not visualized due to noncooperation.  Cardiovascular - Regular rate and rhythm.  Neuro - intubated on sedation, eyes closed, not following commands. With forced eye opening, both eye in mid position, not blinking to visual threat, doll's eyes absent, not tracking, PERRL. Corneal reflex present, gag and cough present. Breathing over the vent.  Facial symmetry not able  to test due to ET tube.  Tongue protrusion not cooperative. Limited spontaneous movement of LUE but not against gravity, no significant movement in other extremities. Sensation, coordination and gait not tested.     ASSESSMENT/PLAN [name] is a 51 y.o. male with history of end-stage renal disease on intermittent hemodialysis, diabetes, hypertension presented with  witnessed onset slurred speech, with headache at 10:30 PM for which EMS was activated.  On their arrival they report blood pressure was 260/140, glucose 126   ICH: Acute right cerebellar small ICH, likely due to chronic uncontrolled hypertension  Code Stroke CT head Acute intraparenchymal hematoma centered in the right cerebellar hemisphere with severe mass effect on the fourth ventricle and cerebral aqueduct. No hydrocephalus. CT repeat - hemorrhage size stable from earlier today. There is likely extension into the fourth ventricle but no intraventricular spread or hydrocephalus. CT head and neck premature atherosclerosis with notable vessel tortuosity suggesting chronic hypertension CT repeat 11/16 stable hematoma and no hydrocephalus CT Head 11/17 1. Small acute infarct at the right corpus callosum body and cingulate white matter, not seen on admission head CT. 2. Non progressed right cerebellar hematoma and swelling. No hydrocephalus. Will consider MRI with and without contrast once stable 2D Echo EF 50 to 55% LDL 91 HgbA1c 5.9 VTE prophylaxis - SCD's No antithrombotic prior to admission, now on No antithrombotic.  Therapy recommendations:  pending Disposition:  pending  Cerebral Edema On 3% saline protocol -> off now due to fluid status Last NA 143->148->148 -> 144, with NA goal 150 Serial NA checks Q4h -> 23.4% PRN to keep Na goal around 150 11/18- 23.4% bolus ordered NSGY following - no surgical intervention at this time   ESRD on HD HD MWF Renal Dr. Jonnie Finner following, appreciate help Had HD  catheter CRRT stopped 11/17 due to severe anemia -> transition to HD HTS with 23.4% boluses PRN  Acute hypoxic Respiratory failure  Vent management per CCM  On Fentanyl and prop for sedation  Versed as needed for agitation on vent Not candidate for extubation  Hypertension Home meds:  norvasc 10 mg, coreg 25 mg, imdur 30 mg, losartan 100 mg- on hold  UnStable Cleviprex still ongoing Resume home norvasc and coreg SBP goal < 160 BP goal normotensive  Hyperlipidemia Home meds:  none LDL 91 goal < 70 Consider statin at discharge  Diabetes type II Controlled Home meds:  Semglee HgbA1c 5.9, goal < 7.0 CBGs SSI  dysphagia  OG tube and cortrak  placed [facility] day # 4   Patient seen and examined by NP/APP with MD. MD to update note as needed.   Janine Ores, DNP, FNP-BC [facility] Pager: [phone]  ATTENDING ATTESTATION:  23 with end-stage renal disease now on hemodialysis.  Has a right cerebellar intracranial hemorrhage.  She is intubated heavily sedated due to gagging when weaning.  Due to volume status from hemodialysis hypertonic stopped.  She was given a bolus of 23.4% hypertonic saline this morning as sodium was below 150.  Potassium was elevated at 6.6 today and undergoing hemodialysis today and nephrology is aware and will do low potassium bath during hemodialysis.  We will get EKG.  Temperature spike of 9.5 overnight continue to monitor and continue cefepime.  Seen by neurosurgery and no intervention. recommended head CT tomorrow.  Required infusion of 1 unit for low hemoglobin yesterday and is stable this morning at 8.8 we will continue to monitor.  Attempted to call patient's Sister Randy Ross but no response.  Appreciate CCM and nephrology assistance.   Dr. Reeves Forth evaluated pt independently, reviewed imaging, chart, labs. Discussed and formulated plan with the Resident/APP. Changes were made to the note where appropriate. Please see  APP/resident note above for details.    This patient is critically ill due to cerebellar ICH, cerebral edema, respiratory failure, ESRD, hypertensive emergency and at significant risk of neurological worsening, death form hematoma expansion, brain herniation, obstructive hydrocephalus, seizure. This patient's care requires constant monitoring of vital signs, hemodynamics, respiratory and cardiac monitoring, review of multiple databases, neurological assessment, discussion with family, other specialists and medical decision making of high complexity. I spent 35 minutes of neurocritical care time in the care of this patient.   Kailly Richoux,MD  To contact Stroke Continuity provider, please refer to [email or web address] After hours, contact General Neurology

## 2022-08-28 NOTE — Progress Notes (Signed)
NAMERehman Ross, MRN:  [id number], DOB:  1971-07-28, LOS: 4 ADMISSION DATE:  08/24/2022, CONSULTATION DATE:  08/25/2022 REFERRING MD:  Curly [place] - Neuro CHIEF COMPLAINT:  AMS   History of Present Illness:  51 year old man who presented to [facility] ED 11/14 with left sided visual preference, left sided weakness, slurred speech. He was also profoundly hypertensive with SBP over 200s (highest documented is SBP 230). PMHx significant for HTN, T2DM, ESRD on HD (MWF).  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 He was intubated in the ED and neurosurgery was consulted for consideration of EVD.  He was admitted by neurology and PCCM was called in consultation for vent management.  Pertinent Medical History:   Past Medical History:  Diagnosis Date   Anemia    CKD (chronic kidney disease)    on wed 12/09/21   Diabetes mellitus without complication ([place])    type 2   Hypertension    Renal disorder    Significant Hospital Events: Including procedures, antibiotic start and stop dates in addition to other pertinent events   11/15 - Admitted overnight. 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  CTA Head/Neck without e/o vascular lesion at R cerebellar hemorrhage. HTS 3% initiated. Trialysis catheter placed L IJ for CRRT/HTS 23% (per Stroke team). CRRT initiated. 11/16 Low grade temp with Tmax 99.4, WBC uptrending 25.9 (9.0). CXR with increased RLL opacities. Cefepime initiated. Resp Cx/TA sent. TG elevated, weaning prop. 11/17 Ongoing low grade temps Tmax 100.38F. WBC downtrending 19.3 (25.9). CRRT filter clotting causing blood loss, Hgb 6.3. CRRT stopped, 1U PRBCs transfused. HD to resume. Repeat CT Head with small acute infarct at R corpus callosum body/cingulate white matter, stable R  cerebellar hematoma. CXR improved on cefepime.  Interim History / Subjective:   HD this morning Gagging on tube when sedation held  Objective:  Blood pressure (!) 148/73, pulse 75, temperature 99.3 F (37.4 C), temperature source Esophageal, resp. rate 17, height '5\' 5"'$  (1.651 m), weight 86.1 kg, SpO2 99 %.    Vent Mode: PRVC FiO2 (%):  [40 %] 40 % Set Rate:  [16 bmp] 16 bmp Vt Set:  [490 mL] 490 mL PEEP:  [5 cmH20] 5 cmH20 Plateau Pressure:  [16 cmH20-20 cmH20] 18 cmH20   Intake/Output Summary (Last 24 hours) at 08/28/2022 1031 Last data filed at 08/28/2022 0800 Gross per 24 hour  Intake 2991.15 ml  Output --  Net 2991.15 ml   Filed Weights   08/26/22 0500 08/27/22 0500 08/28/22 0742  Weight: 80.4 kg 81.5 kg 86.1 kg   Physical Examination:  General:  In bed on vent HENT: NCAT ETT in place PULM: CTA B, vent supported breathing CV: RRR, no mgr GI: BS+, soft, nontender MSK: normal bulk and tone Neuro: sedated on vent   Labs/imaging personally reviewed:  CT head 11/4 > 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 Hypertensive emergency - hx of HTN, unsure of compliance  with med regimen Acute hypoxic respiratory failure with inability to protect the airway - s/p intubation in ED PNA RLL Hx ESRD on HD MWF Hyperkalemia 11/18 Permissive hypernatremia Anemia 2/2 acute blood loss associated with CRRT Hx DM At risk for malnutrition constipated  Discussion: Stable today, off vasopressors, Hgb stable.  No clear cause of bleeding other than blood loss through clotted CRRT circuits.     Plan: Full mechanical vent support VAP prevention Daily WUA/SBT iHD today Keep sedated thorugh iHD, then begin WUA and SBT attempts Change cefepime to ceftriaxone tomorrow if culture negative Continue SSI Increase tube feeding coverage Add bowel regimen Repeat head CT per neuro No anticoagulants    Best practice (evaluated daily):  Diet/type: NPO DVT prophylaxis: not indicated GI prophylaxis: PPI Lines: N/A Foley:  N/A Code Status:  full code Last date of multidisciplinary goals of care discussion: Per primary.  Critical care time: 32 minutes   The patient is critically ill with multiple organ system failure and requires high complexity decision making for assessment and support, frequent evaluation and titration of therapies, advanced monitoring, review of radiographic studies and interpretation of complex data.   Critical Care Time devoted to patient care services, exclusive of separately billable procedures, described in this note is 32 minutes.  Roselie Awkward, MD [facility] Pager: [phone] Cell: [phone] After 7:00 pm call Elink  [phone]

## 2022-08-28 NOTE — Progress Notes (Signed)
E-Link made aware of patient's hypotension via arterial line and BP cuff. Sedation stopped for the time being.

## 2022-08-29 ENCOUNTER — Inpatient Hospital Stay (HOSPITAL_COMMUNITY): Payer: No Typology Code available for payment source

## 2022-08-29 DIAGNOSIS — R4182 Altered mental status, unspecified: Secondary | ICD-10-CM

## 2022-08-29 LAB — BASIC METABOLIC PANEL
Anion gap: 12 (ref 5–15)
Anion gap: 13 (ref 5–15)
BUN: 64 mg/dL — ABNORMAL HIGH (ref 6–20)
BUN: 73 mg/dL — ABNORMAL HIGH (ref 6–20)
CO2: 22 mmol/L (ref 22–32)
CO2: 22 mmol/L (ref 22–32)
Calcium: 8.5 mg/dL — ABNORMAL LOW (ref 8.9–10.3)
Calcium: 8.7 mg/dL — ABNORMAL LOW (ref 8.9–10.3)
Chloride: 114 mmol/L — ABNORMAL HIGH (ref 98–111)
Chloride: 113 mmol/L — ABNORMAL HIGH (ref 98–111)
Creatinine, Ser: 6.59 mg/dL — ABNORMAL HIGH (ref 0.61–1.24)
Creatinine, Ser: 7.02 mg/dL — ABNORMAL HIGH (ref 0.61–1.24)
GFR, Estimated: 9 mL/min — ABNORMAL LOW (ref >60)
GFR, Estimated: 9 mL/min — ABNORMAL LOW (ref >60)
Glucose, Bld: 77 mg/dL (ref 70–99)
Glucose, Bld: 133 mg/dL — ABNORMAL HIGH (ref 70–99)
Potassium: 5.5 mmol/L — ABNORMAL HIGH (ref 3.5–5.1)
Potassium: 6 mmol/L — ABNORMAL HIGH (ref 3.5–5.1)
Sodium: 148 mmol/L — ABNORMAL HIGH (ref 135–145)
Sodium: 148 mmol/L — ABNORMAL HIGH (ref 135–145)

## 2022-08-29 LAB — IRON AND TIBC
Iron: 22 ug/dL — ABNORMAL LOW (ref 45–182)
Saturation Ratios: 22 % (ref 17.9–39.5)
TIBC: 99 ug/dL — ABNORMAL LOW (ref 250–450)
UIBC: 77 ug/dL

## 2022-08-29 LAB — CULTURE, RESPIRATORY W GRAM STAIN

## 2022-08-29 LAB — CBC
HCT: 25.3 % — ABNORMAL LOW (ref 39.0–52.0)
Hemoglobin: 8.1 g/dL — ABNORMAL LOW (ref 13.0–17.0)
MCH: 23 pg — ABNORMAL LOW (ref 26.0–34.0)
MCHC: 32 g/dL (ref 30.0–36.0)
MCV: 71.9 fL — ABNORMAL LOW (ref 80.0–100.0)
Platelets: 174 10*3/uL (ref 150–400)
RBC: 3.52 MIL/uL — ABNORMAL LOW (ref 4.22–5.81)
RDW: 22.5 % — ABNORMAL HIGH (ref 11.5–15.5)
WBC: 11.7 10*3/uL — ABNORMAL HIGH (ref 4.0–10.5)
nRBC: 8.4 % — ABNORMAL HIGH (ref 0.0–0.2)

## 2022-08-29 LAB — GLUCOSE, CAPILLARY
Glucose-Capillary: 133 mg/dL — ABNORMAL HIGH (ref 70–99)
Glucose-Capillary: 158 mg/dL — ABNORMAL HIGH (ref 70–99)
Glucose-Capillary: 163 mg/dL — ABNORMAL HIGH (ref 70–99)
Glucose-Capillary: 71 mg/dL (ref 70–99)
Glucose-Capillary: 132 mg/dL — ABNORMAL HIGH (ref 70–99)
Glucose-Capillary: 173 mg/dL — ABNORMAL HIGH (ref 70–99)

## 2022-08-29 LAB — SODIUM
Sodium: 147 mmol/L — ABNORMAL HIGH (ref 135–145)
Sodium: 146 mmol/L — ABNORMAL HIGH (ref 135–145)
Sodium: 145 mmol/L (ref 135–145)
Sodium: 149 mmol/L — ABNORMAL HIGH (ref 135–145)
Sodium: 149 mmol/L — ABNORMAL HIGH (ref 135–145)

## 2022-08-29 LAB — TRIGLYCERIDES: Triglycerides: 101 mg/dL (ref <150)

## 2022-08-29 MED ORDER — SODIUM CHLORIDE 0.9 % IV SOLN
2.0000 g | INTRAVENOUS | Status: AC
  Administered 2022-08-29 – 2022-09-01 (×4): 2 g via INTRAVENOUS
  Filled 2022-08-29 (×4): qty 20

## 2022-08-29 MED ORDER — DARBEPOETIN ALFA 150 MCG/0.3ML IJ SOSY
150.0000 ug | PREFILLED_SYRINGE | INTRAMUSCULAR | Status: DC
  Administered 2022-08-29 – 2022-09-05 (×2): 150 ug via SUBCUTANEOUS
  Filled 2022-08-29 (×2): qty 0.3

## 2022-08-29 MED ORDER — SODIUM CHLORIDE 23.4 % INJECTION (4 MEQ/ML) FOR IV ADMINISTRATION
120.0000 meq | Freq: Once | INTRAVENOUS | Status: AC
  Administered 2022-08-29: 120 meq via INTRAVENOUS
  Filled 2022-08-29: qty 30

## 2022-08-29 MED ORDER — AMLODIPINE BESYLATE 5 MG PO TABS
5.0000 mg | ORAL_TABLET | Freq: Two times a day (BID) | ORAL | Status: DC
  Administered 2022-08-29 – 2022-09-22 (×42): 5 mg
  Filled 2022-08-29 (×43): qty 1

## 2022-08-29 NOTE — Progress Notes (Signed)
I spoke to the patient's Randy Ross, over the phone.  She thought maybe his significant other and son were visiting him over the weekend.  I told her we have not seen family yesterday or today.  Discussed his prognosis and plan with her.  Answered all her questions to her satisfaction.  She works third shift and has a difficult time getting in.  She will try to make it in tomorrow to come see him.  Approximately 10 minutes time spent.

## 2022-08-29 NOTE — Progress Notes (Addendum)
Objective  Nursing notified Na 147 Gave 23.4% x 1 dose Additionally received 500 cc 0.9% saline per CCM for hypotension after fentanyl given for patient care during bath   Assessment: Subsequent Na 147 likely due to dilution from normal saline bolus concurrently   Plan Continue to monitor sodium, consider 3% boluses in the future if needed to support BP (but take into account current sodium goal, most recent sodium, and trajectory of sodium change)  [place] [phone]

## 2022-08-29 NOTE — Progress Notes (Signed)
Randy Ross, MRN:  [id number], DOB:  1971/07/08, LOS: 5 ADMISSION DATE:  08/24/2022, CONSULTATION DATE:  08/25/2022 REFERRING MD:  Curly [place] - Neuro CHIEF COMPLAINT:  AMS   History of Present Illness:  51 year old man who presented to [facility] ED 11/14 with left sided visual preference, left sided weakness, slurred speech. He was also profoundly hypertensive with SBP over 200s (highest documented is SBP 230). PMHx significant for HTN, T2DM, ESRD on HD (MWF).  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 He was intubated in the ED and neurosurgery was consulted for consideration of EVD.  He was admitted by neurology and PCCM was called in consultation for vent management.  Pertinent Medical History:   Past Medical History:  Diagnosis Date   Anemia    CKD (chronic kidney disease)    on wed 12/09/21   Diabetes mellitus without complication ([place])    type 2   Hypertension    Renal disorder    Significant Hospital Events: Including procedures, antibiotic start and stop dates in addition to other pertinent events   11/15 - Admitted overnight. 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  CTA Head/Neck without e/o vascular lesion at R cerebellar hemorrhage. HTS 3% initiated. Trialysis catheter placed L IJ for CRRT/HTS 23% (per Stroke team). CRRT initiated. 11/16 Low grade temp with Tmax 99.4, WBC uptrending 25.9 (9.0). CXR with increased RLL opacities. Cefepime initiated. Resp Cx/TA > mult spec h.flu, strep species, klebsiella species. TG elevated, weaning prop. 11/17 Ongoing low grade temps Tmax 100.16F. WBC downtrending 19.3 (25.9). CRRT filter clotting causing blood loss, Hgb 6.3. CRRT stopped, 1U PRBCs transfused. HD to resume. Repeat CT Head with small acute infarct at R corpus  callosum body/cingulate white matter, stable R cerebellar hematoma. CXR improved on cefepime. 11/19 stirs to touch  Interim History / Subjective:   Stirs to touch No labs today Weaning on pressure support  Objective:  Blood pressure 128/67, pulse 68, temperature 99.5 F (37.5 C), resp. rate 17, height '5\' 5"'$  (1.651 m), weight 83.6 kg, SpO2 98 %.    Vent Mode: PRVC FiO2 (%):  [40 %] 40 % Set Rate:  [16 bmp] 16 bmp Vt Set:  [490 mL] 490 mL PEEP:  [5 cmH20] 5 cmH20   Intake/Output Summary (Last 24 hours) at 08/29/2022 0717 Last data filed at 08/29/2022 0700 Gross per 24 hour  Intake 2719.54 ml  Output 2500 ml  Net 219.54 ml   Filed Weights   08/27/22 0500 08/28/22 0742 08/28/22 1138  Weight: 81.5 kg 86.1 kg 83.6 kg   Physical Examination:  General:  In bed on vent HENT: NCAT ETT in place PULM: CTA B, vent supported breathing CV: RRR, no mgr GI: BS+, soft, nontender MSK: normal bulk and tone Neuro: sedated on vent, stirs to touch   Labs/imaging personally reviewed:  CT head 11/4 > 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 Hypertensive emergency - hx of  HTN, unsure of compliance with med regimen Acute hypoxic respiratory failure with inability to protect the airway - s/p intubation in ED PNA RLL due to klebsiella, strep, h.flu Hx ESRD on HD MWF Hyperkalemia 11/18 Permissive hypernatremia Anemia 2/2 acute blood loss associated with CRRT DM2 with hyperglycemia At risk for malnutrition  Discussion: Stable today, seems to be waking up.  Plan: Full mechanical vent support VAP  prevention Daily WUA/SBT> hold sedatives this morning Repeat BMET now No anticoagulants Sodium goal per neurology Continue SSI and tube feeding coverage Head imaging per neurology Narrow antibiotics to ceftriaxone   Best practice (evaluated daily):  Diet/type: NPO DVT prophylaxis: not indicated GI prophylaxis: PPI Lines: N/A Foley:  N/A Code Status:  full code Last date of multidisciplinary goals of care discussion: Per primary.  Critical care time: 32 minutes   The patient is critically ill with multiple organ system failure and requires high complexity decision making for assessment and support, frequent evaluation and titration of therapies, advanced monitoring, review of radiographic studies and interpretation of complex data.   Critical Care Time devoted to patient care services, exclusive of separately billable procedures, described in this note is 32 minutes.  Roselie Awkward, MD [place] [facility] Pager: [phone] Cell: [phone] After 7:00 pm call Elink  [phone]

## 2022-08-29 NOTE — Progress Notes (Signed)
Pt placed back on full support at this time, per Dr. Lake Bells

## 2022-08-29 NOTE — Progress Notes (Addendum)
STROKE TEAM PROGRESS NOTE   INTERVAL HISTORY Cr 6.59, K 5.5 Continue using 23.4% HTS if needed. Na goal 148-152.   Remains intubated and sedation decreased, currently just on fentanyl EEG today shows some triphasic morphology and moderate to severe encephalopathy  Vitals:   08/29/22 0807 08/29/22 0845 08/29/22 1153 08/29/22 1200  BP:  (!) 176/76  (!) 145/66  Pulse: 67 71 69 69  Resp: 17 (!) 23 18 (!) 21  Temp:  99.5 F (37.5 C) 99.7 F (37.6 C) 99.9 F (37.7 C)  TempSrc:      SpO2: 99% 94% 97% 97%  Weight:      Height:       CBC:  Recent Labs  Lab 08/24/22 2330 08/25/22 0151 08/28/22 0549 08/29/22 0500  WBC 9.3   < > 15.5* 11.7*  NEUTROABS 6.3  --   --   --   HGB 9.2*  10.5*   < > 8.8* 8.1*  HCT 29.5*  31.0*   < > 27.1* 25.3*  MCV 66.1*   < > 72.1* 71.9*  PLT 280   < > 171 174   < > = values in this interval not displayed.    Basic Metabolic Panel:  Recent Labs  Lab 08/27/22 0142 08/27/22 0545 08/27/22 1327 08/28/22 0549 08/28/22 0957 08/29/22 0755 08/29/22 1154  NA 150* 148*   < > 150*   < > 145 148*  149*  K  --  5.5*  --  6.6*  --   --  5.5*  CL  --  116*  --  119*  --   --  114*  CO2  --  24  --  21*  --   --  22  GLUCOSE  --  254*  --  222*  --   --  77  BUN  --  29*  --  59*  --   --  64*  CREATININE  --  4.80*  --  7.07*  --   --  6.59*  CALCIUM  --  7.8*  --  8.5*  --   --  8.5*  MG 2.1  --   --  2.5*  --   --   --   PHOS  --  3.5  --  4.6  --   --   --    < > = values in this interval not displayed.    Lipid Panel:  Recent Labs  Lab 08/26/22 0410 08/29/22 0450  CHOL 186  --   TRIG 372*  355* 101  HDL 21*  --   CHOLHDL 8.9  --   VLDL 74*  --   LDLCALC 91  --     HgbA1c:  Recent Labs  Lab 08/25/22 0231  HGBA1C 5.9*    Urine Drug Screen: No results for input(s): "LABOPIA", "COCAINSCRNUR", "LABBENZ", "AMPHETMU", "THCU", "LABBARB" in the last 168 hours.  Alcohol Level  Recent Labs  Lab 08/24/22 2330  ETH <10     IMAGING  past 24 hours EEG adult  Result Date: 08/29/2022 Lora Havens, MD     08/29/2022 11:46 AM Patient Name: Randy Ross MRN: [id number] Epilepsy Attending: Lora Havens Referring Physician/Provider: Janine Ores, NP Date: 08/29/2022 Duration: 21.40 mins Patient history: 51yo M with ams. EEG to evaluate for seizure. Level of alertness:  lethargic AEDs during EEG study: propofol Technical aspects: This EEG study was done with scalp electrodes positioned according to the 10-20 International system of electrode placement.  Electrical activity was reviewed with band pass filter of 1-'70Hz'$ , sensitivity of 7 uV/mm, display speed of 8m/sec with a '60Hz'$  notched filter applied as appropriate. EEG data were recorded continuously and digitally stored.  Video monitoring was available and reviewed as appropriate. Description: EEG showed continuous generalized 3 to 6 Hz theta-delta slowing. Intermittent generalized periodic discharges with triphasic morphology at 1 Hz were also noted, more prominent when awake/stimulated. Hyperventilation and photic stimulation were not performed.   ABNORMALITY - Periodic discharges with triphasic morphology, generalized ( GPDs) - Continuous slow, generalized IMPRESSION: This study showed generalized periodic discharges with triphasic morphology which is on the ictal-interictal continuum. However, the frequency, morphology and reactivity to stimulation is most likely indicative of toxic-metabolic causes. . Additionally there is moderate to severe diffuse encephalopathy, nonspecific etiology. No seizures were seen throughout the recording. [place]  [place] HEAD WO CONTRAST (5MM)  Result Date: 08/29/2022 CLINICAL DATA:  Intracranial hemorrhage EXAM: CT HEAD WITHOUT CONTRAST TECHNIQUE: Contiguous axial images were obtained from the base of the skull through the vertex without intravenous contrast. RADIATION DOSE REDUCTION: This exam was performed according to the departmental  dose-optimization program which includes automated exposure control, adjustment of the mA and/or kV according to patient size and/or use of iterative reconstruction technique. COMPARISON:  None Available. FINDINGS: Brain: Unchanged intraparenchymal hemorrhage in the right cerebellar hemisphere with possible minimal extension into the fourth ventricle. No hydrocephalus. Vascular: No abnormal hyperdensity of the major intracranial arteries or dural venous sinuses. No intracranial atherosclerosis. Skull: The visualized skull base, calvarium and extracranial soft tissues are normal. Sinuses/Orbits: No fluid levels or advanced mucosal thickening of the visualized paranasal sinuses. No mastoid or middle ear effusion. The orbits are normal. IMPRESSION: Unchanged intraparenchymal hemorrhage in the right cerebellar hemisphere with possible minimal extension into the fourth ventricle. No hydrocephalus. Electronically Signed   By: KUlyses JarredM.D.   On: 08/29/2022 03:44    PHYSICAL EXAM  Temp:  [97.5 F (36.4 C)-100.8 F (38.2 C)] 99.9 F (37.7 C) (11/19 1200) Pulse Rate:  [64-75] 69 (11/19 1200) Resp:  [11-29] 21 (11/19 1200) BP: (81-176)/(38-94) 145/66 (11/19 1200) SpO2:  [89 %-100 %] 97 % (11/19 1200) Arterial Line BP: (85-177)/(37-85) 145/60 (11/19 1200) FiO2 (%):  [40 %] 40 % (11/19 0807)  General - Well nourished, well developed, intubated on sedation.  Ophthalmologic - fundi not visualized due to noncooperation.  Cardiovascular - Regular rate and rhythm.  Neuro - intubated on sedation, eyes closed, not following commands. With forced eye opening, both eye in mid position, not blinking to visual threat, doll's eyes absent, not tracking, PERRL. Corneal reflex present, gag and cough present. Breathing over the vent.  Facial symmetry not able to test due to ET tube.  Tongue protrusion not cooperative. Limited spontaneous movement of LUE but not against gravity, no significant movement in other  extremities. Sensation, coordination and gait not tested.     ASSESSMENT/PLAN Mr. VGavan Nordbyis a 51y.o. male with history of end-stage renal disease on intermittent hemodialysis, diabetes, hypertension presented with  witnessed onset slurred speech, with headache at 10:30 PM for which EMS was activated.  On their arrival they report blood pressure was 260/140, glucose 126   ICH: Acute right cerebellar small ICH, likely due to chronic uncontrolled hypertension  Code Stroke CT head Acute intraparenchymal hematoma centered in the right cerebellar hemisphere with severe mass effect on the fourth ventricle and cerebral aqueduct. No hydrocephalus. CT repeat - hemorrhage size stable from earlier  today. There is likely extension into the fourth ventricle but no intraventricular spread or hydrocephalus. CT head and neck premature atherosclerosis with notable vessel tortuosity suggesting chronic hypertension CT repeat 11/16 stable hematoma and no hydrocephalus CT Head 11/17 1. Small acute infarct at the right corpus callosum body and cingulate white matter, not seen on admission head CT. 2. Non progressed right cerebellar hematoma and swelling. No hydrocephalus. Will consider MRI with and without contrast once stable 2D Echo EF 50 to 55% EEG- This study showed generalized periodic discharges with triphasic morphology which is on the ictal-interictal continuum. However, the frequency, morphology and reactivity to stimulation is most likely indicative of toxic-metabolic causes. . Additionally there is moderate to severe diffuse encephalopathy, nonspecific etiology. No seizures were seen throughout the recording.  LDL 91 HgbA1c 5.9 VTE prophylaxis - SCD's No antithrombotic prior to admission, now on No antithrombotic.  Therapy recommendations:  pending Disposition:  pending  Cerebral Edema On 3% saline protocol -> off now due to fluid status Last NA 143->148->148 -> 144, with NA goal  150 Serial NA checks Q4h -> 23.4% PRN to keep Na goal around 150 11/18- 23.4% bolus ordered NSGY following - no surgical intervention at this time   ESRD on HD HD MWF Renal Dr. Jonnie Finner following, appreciate help Had HD catheter CRRT stopped 11/17 due to severe anemia -> transition to HD HTS with 23.4% boluses PRN  Acute hypoxic Respiratory failure  Vent management per CCM  On Fentanyl and prop for sedation  Versed as needed for agitation on vent Not candidate for extubation  Hypertension Home meds:  norvasc 10 mg, coreg 25 mg, imdur 30 mg, losartan 100 mg- on hold  UnStable Cleviprex still ongoing Resume home norvasc and coreg SBP goal < 160 BP goal normotensive  Hyperlipidemia Home meds:  none LDL 91 goal < 70 Consider statin at discharge  Diabetes type II Controlled Home meds:  Semglee HgbA1c 5.9, goal < 7.0 CBGs SSI  dysphagia  OG tube and cortrak placed [facility] day # 5   Patient seen and examined by NP/APP with MD. MD to update note as needed.   Janine Ores, DNP, FNP-BC [facility] Pager: [phone]    Expand All Collapse All  STROKE TEAM PROGRESS NOTE    INTERVAL HISTORY Dialysis today. Hgb 8.8 Cr 7 K 6.6 Continue using 23.4% HTS if needed. Na goal 148-152.  Bolus ordered -> 1000 Na 144 Resuming his norvasc and coreg, HTN throughout dialysis today.          Vitals:    08/28/22 1115 08/28/22 1130 08/28/22 1136 08/28/22 1138  BP: 132/68 120/62 135/66 139/68  Pulse: 75 78 76 76  Resp: '15 15 16 16  '$ Temp: 99.7 F (37.6 C) 99.7 F (37.6 C) 99.7 F (37.6 C) 99.7 F (37.6 C)  TempSrc: Esophageal Esophageal Esophageal Esophageal  SpO2: 100% 100% 100% 100%  Weight:       (P) 83.6 kg  Height:            CBC:  Last Labs        Recent Labs  Lab 08/24/22 2330 08/25/22 0151 08/27/22 1951 08/28/22 0549  WBC 9.3   < > 15.5* 15.5*  NEUTROABS 6.3  --   --   --   HGB 9.2*  10.5*   < > 8.4* 8.8*  HCT 29.5*   31.0*   < > 25.3* 27.1*  MCV 66.1*   < > 72.1* 72.1*  PLT 280   < > 157 171   < > = values in this interval not displayed.      Basic Metabolic Panel:  Last Labs         Recent Labs  Lab 08/27/22 0142 08/27/22 0545 08/27/22 1327 08/28/22 0549 08/28/22 0957  NA 150* 148*   < > 150* 144  K  --  5.5*  --  6.6*  --   CL  --  116*  --  119*  --   CO2  --  24  --  21*  --   GLUCOSE  --  254*  --  222*  --   BUN  --  29*  --  59*  --   CREATININE  --  4.80*  --  7.07*  --   CALCIUM  --  7.8*  --  8.5*  --   MG 2.1  --   --  2.5*  --   PHOS  --  3.5  --  4.6  --    < > = values in this interval not displayed.      Lipid Panel:  Last Labs     Recent Labs  Lab 08/26/22 0410  CHOL 186  TRIG 372*  355*  HDL 21*  CHOLHDL 8.9  VLDL 74*  LDLCALC 91      HgbA1c:  Last Labs     Recent Labs  Lab 08/25/22 0231  HGBA1C 5.9*      Urine Drug Screen:  Last Labs  No results for input(s): "LABOPIA", "COCAINSCRNUR", "LABBENZ", "AMPHETMU", "THCU", "LABBARB" in the last 168 hours.    Alcohol Level  Last Labs     Recent Labs  Lab 08/24/22 2330  ETH <10        IMAGING past 24 hours CT HEAD WO CONTRAST (5MM)   Result Date: 08/27/2022 CLINICAL DATA:  Stroke follow-up EXAM: CT HEAD WITHOUT CONTRAST TECHNIQUE: Contiguous axial images were obtained from the base of the skull through the vertex without intravenous contrast. RADIATION DOSE REDUCTION: This exam was performed according to the departmental dose-optimization program which includes automated exposure control, adjustment of the mA and/or kV according to patient size and/or use of iterative reconstruction technique. COMPARISON:  Two days ago FINDINGS: Brain: Unchanged hematoma and edema in the right cerebellum approaching the fourth ventricle which is partially carotid inferiorly. No hydrocephalus. Low-density new from admission head CT beneath the right cingulate gyrus and at the right corpus callosum. Vascular:  Atheromatous calcification. Skull: No acute or aggressive finding Sinuses/Orbits: Generalized sinus inflammation in the setting of intubation. IMPRESSION: 1. Small acute infarct at the right corpus callosum body and cingulate white matter, not seen on admission head CT. 2. Non progressed right cerebellar hematoma and swelling. No hydrocephalus. Electronically Signed   By: Jorje Guild M.D.   On: 08/27/2022 12:09     PHYSICAL EXAM   Temp:  [98.2 F (36.8 C)-99.9 F (37.7 C)] 99.7 F (37.6 C) (11/18 1138) Pulse Rate:  [65-78] 76 (11/18 1138) Resp:  [11-24] 16 (11/18 1138) BP: (115-161)/(54-107) 139/68 (11/18 1138) SpO2:  [94 %-100 %] 100 % (11/18 1138) Arterial Line BP: (103-176)/(45-80) 159/64 (11/18 1030) FiO2 (%):  [40 %] 40 % (11/18 0816) Weight:  [83.6 kg-86.1 kg] (P) 83.6 kg (11/18 1138)   General - Well nourished, well developed, intubated on sedation.   Ophthalmologic - fundi not visualized due to noncooperation.   Cardiovascular - Regular rate and rhythm.   Neuro - intubated  on sedation, eyes closed, not following commands. With forced eye opening, both eye in mid position, not blinking to visual threat, doll's eyes absent, not tracking, PERRL. Corneal reflex present, gag and cough present. Breathing over the vent.  Facial symmetry not able to test due to ET tube.  Tongue protrusion not cooperative. Limited spontaneous movement of LUE but not against gravity, no significant movement in other extremities. Sensation, coordination and gait not tested.        ASSESSMENT/PLAN Mr. Sathvik Tiedt is a 51 y.o. male with history of end-stage renal disease on intermittent hemodialysis, diabetes, hypertension presented with  witnessed onset slurred speech, with headache at 10:30 PM for which EMS was activated.  On their arrival they report blood pressure was 260/140, glucose 126    ICH: Acute right cerebellar small ICH, likely due to chronic uncontrolled hypertension  Code Stroke CT  head Acute intraparenchymal hematoma centered in the right cerebellar hemisphere with severe mass effect on the fourth ventricle and cerebral aqueduct. No hydrocephalus. CT repeat - hemorrhage size stable from earlier today. There is likely extension into the fourth ventricle but no intraventricular spread or hydrocephalus. CT head and neck premature atherosclerosis with notable vessel tortuosity suggesting chronic hypertension CT repeat 11/16 stable hematoma and no hydrocephalus CT Head 11/17 1. Small acute infarct at the right corpus callosum body and cingulate white matter, not seen on admission head CT. 2. Non progressed right cerebellar hematoma and swelling. No hydrocephalus. Will consider MRI with and without contrast once stable 2D Echo EF 50 to 55% LDL 91 HgbA1c 5.9 VTE prophylaxis - SCD's No antithrombotic prior to admission, now on No antithrombotic.  Therapy recommendations:  pending Disposition:  pending   Cerebral Edema On 3% saline protocol -> off now due to fluid status Last NA 143->148->148 -> 144, with NA goal 150 Serial NA checks Q4h -> 23.4% PRN to keep Na goal around 150 11/18- 23.4% bolus ordered NSGY following - no surgical intervention at this time    ESRD on HD HD MWF Renal Dr. Jonnie Finner following, appreciate help Had HD catheter CRRT stopped 11/17 due to severe anemia -> transition to HD HTS with 23.4% boluses PRN   Acute hypoxic Respiratory failure  Vent management per CCM  On Fentanyl and prop for sedation  Versed as needed for agitation on vent Not candidate for extubation   Hypertension Home meds:  norvasc 10 mg, coreg 25 mg, imdur 30 mg, losartan 100 mg- on hold  UnStable Cleviprex still ongoing Resume home norvasc and coreg SBP goal < 160 BP goal normotensive   Hyperlipidemia Home meds:  none LDL 91 goal < 70 Consider statin at discharge   Diabetes type II Controlled Home meds:  Semglee HgbA1c 5.9, goal < 7.0 CBGs SSI   dysphagia   OG tube and cortrak placed [facility] day # 4     Patient seen and examined by NP/APP with MD. MD to update note as needed.    Janine Ores, DNP, FNP-BC [facility] Pager: [phone]   ATTENDING ATTESTATION:   15 with end-stage renal disease now on hemodialysis.  Has a right cerebellar intracranial hemorrhage.   intubated and sedated due to gagging when weaning.   continue cefepime.  Seen by neurosurgery and no intervention. recommended head CT stable.  CCM wean pressure support in a.m. then full support was resumed in the afternoon.  CCM continue to wean sedation.  Required hypertonic infusion this morning we will  continue to monitor.  Sodium goal greater than 150 for now, would likely start trending down tomorrow.  EEG ordered today as there is no improvement and shows metabolic encephalopathy likely due to kidney disease.   Appreciate CCM and nephrology assistance.     Dr. Reeves Forth evaluated pt independently, reviewed imaging, chart, labs. Discussed and formulated plan with the Resident/APP. Changes were made to the note where appropriate. Please see APP/resident note above for details.     This patient is critically ill due to cerebellar ICH, cerebral edema, respiratory failure, ESRD, hypertensive emergency and at significant risk of neurological worsening, death form hematoma expansion, brain herniation, obstructive hydrocephalus, seizure. This patient's care requires constant monitoring of vital signs, hemodynamics, respiratory and cardiac monitoring, review of multiple databases, neurological assessment, discussion with family, other specialists and medical decision making of high complexity. I spent 35 minutes of neurocritical care time in the care of this patient.    Verla Bryngelson,MD       To contact Stroke Continuity provider, please refer to [email or web address] After hours, contact General Neurology

## 2022-08-29 NOTE — Progress Notes (Signed)
RT and RN transported pt to CT and back to 4N. No complication. Vitals stable.

## 2022-08-29 NOTE — Procedures (Signed)
Patient Name: Randy Ross  MRN: [id number]  Epilepsy Attending: Lora Havens  Referring Physician/Provider: Janine Ores, NP  Date: 08/29/2022 Duration: 21.40 mins  Patient history: 51yo M with ams. EEG to evaluate for seizure.  Level of alertness:  lethargic   AEDs during EEG study: propofol  Technical aspects: This EEG study was done with scalp electrodes positioned according to the 10-20 International system of electrode placement. Electrical activity was reviewed with band pass filter of 1-'70Hz'$ , sensitivity of 7 uV/mm, display speed of 18m/sec with a '60Hz'$  notched filter applied as appropriate. EEG data were recorded continuously and digitally stored.  Video monitoring was available and reviewed as appropriate.  Description: EEG showed continuous generalized 3 to 6 Hz theta-delta slowing. Intermittent generalized periodic discharges with triphasic morphology at 1 Hz were also noted, more prominent when awake/stimulated. Hyperventilation and photic stimulation were not performed.     ABNORMALITY - Periodic discharges with triphasic morphology, generalized ( GPDs) - Continuous slow, generalized  IMPRESSION: This study showed generalized periodic discharges with triphasic morphology which is on the ictal-interictal continuum. However, the frequency, morphology and reactivity to stimulation is most likely indicative of toxic-metabolic causes. . Additionally there is moderate to severe diffuse encephalopathy, nonspecific etiology. No seizures were seen throughout the recording.  Ashtan Girtman OBarbra Sarks

## 2022-08-29 NOTE — Progress Notes (Signed)
EEG complete - results pending 

## 2022-08-29 NOTE — Progress Notes (Signed)
No significant change in status.  Patient remains on ventilator.  Sedation has been lifted and he is slowly starting to arouse but still is very somnolent.  Follow-up head CT scan demonstrates stable appearance of his small right medial cerebellar hemorrhage.  Although there is some mild compression of fourth ventricle there is no evidence of hydrocephalus.  Status post cerebellar hemorrhage.  Continue supportive efforts.  No plans for operative intervention.

## 2022-08-29 NOTE — Progress Notes (Signed)
Dr. Curly Shores notified of morning sodium of 146. No new orders.

## 2022-08-29 NOTE — Progress Notes (Signed)
[facility] Progress Note  Subjective: HD yest w/ 2.5 L net UF.  K+ down to 5.5 today post HD yesterday. Na 148 today.   Vitals:   08/29/22 0807 08/29/22 0845 08/29/22 1153 08/29/22 1200  BP:  (!) 176/76  (!) 145/66  Pulse: 67 71 69 69  Resp: 17 (!) 23 18 (!) 21  Temp:  99.5 F (37.5 C) 99.7 F (37.6 C) 99.9 F (37.7 C)  TempSrc:      SpO2: 99% 94% 97% 97%  Weight:      Height:        Exam: Gen on vent, sedated Sclera anicteric, throat w/ ETT No jvd or bruits Chest clear anterior/ lateral RRR no RG Abd soft ntnd no mass or ascites +bs Ext no UE or LE edema Neuro is on vent, sedated   LFA AVF+bruit      Home meds include - amlodipine 10, coreg 25 qd, losartan 100, hydralazine 50 tid, MVI, semglee insulin, renavite, phoslo2 ac tid, rocaltrol 0.25 qd, prns/ vits        OP HD: SW MWF 3h 25mn  80kg   400/800   2/2 bath  Hep 2000  LFA AVF - last HD 11/13, post 80.1kg - mircera 150 q2, last 11/13 - hectorol 4 ug IV tiw     Assessment/ Plan: 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 Goal Na+ is now down to 148-152. Na+ 148 today. Will use Na+ 145 w/ HD today.   Uncont HTN - had hypotensive epidose last evening, now appears to be resolving. Off IV cleviprex now, getting coreg and norvasc per tube.   Hyperkalemia - better, down to 5.5 today. HD again today.  ESRD - on HD MWF. SP CRRT 11/15 - 11/17. He had regular HD yesterday and will get iHD again today per the holiday schedule. Cont Na+ 145 for now, they will be weaning down the Na+ goal over the next few days most likely.  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 Check fe/ tibc.  Volume - up 3kg by wts. Mild edema on exam. Lower vol w/ HD again today.  MBD ckd - CCa and phos in range. Cont IV vdra w/ hd tiw. Resume binder when eating.  DM2 - per pmd    Randy Ross 08/29/2022, 1:01 PM   Recent Labs  Lab 08/27/22 0545  08/27/22 1327 08/28/22 0549 08/29/22 0500 08/29/22 1154  HGB 6.3*   < > 8.8* 8.1*  --   ALBUMIN 2.1*  --  2.1*  --   --   CALCIUM 7.8*  --  8.5*  --  8.5*  PHOS 3.5  --  4.6  --   --   CREATININE 4.80*  --  7.07*  --  6.59*  K 5.5*  --  6.6*  --  5.5*   < > = values in this interval not displayed.    No results for input(s): "IRON", "TIBC", "FERRITIN" in the last 168 hours. Inpatient medications:  amLODipine  10 mg Per Tube QHS   carvedilol  25 mg Per Tube BID WC   Chlorhexidine Gluconate Cloth  6 each Topical Daily   Chlorhexidine Gluconate Cloth  6 each Topical Q0600   Chlorhexidine Gluconate Cloth  6 each Topical Q0600   docusate  100 mg Per Tube BID   feeding supplement (PROSource TF20)  60 mL Per Tube TID  insulin aspart  0-15 Units Subcutaneous Q4H   insulin aspart  7 Units Subcutaneous Q4H   multivitamin  1 tablet Per Tube QHS   mouth rinse  15 mL Mouth Rinse Q2H   pantoprazole (PROTONIX) IV  40 mg Intravenous Daily   polyethylene glycol  17 g Per Tube Daily    sodium chloride     sodium chloride 5 mL/hr at 08/29/22 1200   cefTRIAXone (ROCEPHIN)  IV     clevidipine Stopped (08/28/22 0210)   feeding supplement (PEPTAMEN 1.5 CAL) 55 mL/hr at 08/29/22 1200   fentaNYL infusion INTRAVENOUS 50 mcg/hr (08/29/22 1200)   propofol (DIPRIVAN) infusion Stopped (08/29/22 0332)   Place/Maintain arterial line **AND** sodium chloride, sodium chloride, acetaminophen (TYLENOL) oral liquid 160 mg/5 mL, bisacodyl, fentaNYL, labetalol, midazolam, mouth rinse

## 2022-08-30 DIAGNOSIS — I161 Hypertensive emergency: Secondary | ICD-10-CM

## 2022-08-30 DIAGNOSIS — N186 End stage renal disease: Secondary | ICD-10-CM

## 2022-08-30 LAB — BASIC METABOLIC PANEL
Anion gap: 14 (ref 5–15)
Anion gap: 16 — ABNORMAL HIGH (ref 5–15)
BUN: 104 mg/dL — ABNORMAL HIGH (ref 6–20)
BUN: 55 mg/dL — ABNORMAL HIGH (ref 6–20)
CO2: 19 mmol/L — ABNORMAL LOW (ref 22–32)
CO2: 26 mmol/L (ref 22–32)
Calcium: 8.7 mg/dL — ABNORMAL LOW (ref 8.9–10.3)
Calcium: 8.9 mg/dL (ref 8.9–10.3)
Chloride: 115 mmol/L — ABNORMAL HIGH (ref 98–111)
Chloride: 103 mmol/L (ref 98–111)
Creatinine, Ser: 8.5 mg/dL — ABNORMAL HIGH (ref 0.61–1.24)
Creatinine, Ser: 4.74 mg/dL — ABNORMAL HIGH (ref 0.61–1.24)
GFR, Estimated: 7 mL/min — ABNORMAL LOW (ref >60)
GFR, Estimated: 14 mL/min — ABNORMAL LOW (ref >60)
Glucose, Bld: 271 mg/dL — ABNORMAL HIGH (ref 70–99)
Glucose, Bld: 184 mg/dL — ABNORMAL HIGH (ref 70–99)
Potassium: 6.4 mmol/L (ref 3.5–5.1)
Potassium: 5.2 mmol/L — ABNORMAL HIGH (ref 3.5–5.1)
Sodium: 148 mmol/L — ABNORMAL HIGH (ref 135–145)
Sodium: 145 mmol/L (ref 135–145)

## 2022-08-30 LAB — CBC
HCT: 26.1 % — ABNORMAL LOW (ref 39.0–52.0)
Hemoglobin: 8.3 g/dL — ABNORMAL LOW (ref 13.0–17.0)
MCH: 23.1 pg — ABNORMAL LOW (ref 26.0–34.0)
MCHC: 31.8 g/dL (ref 30.0–36.0)
MCV: 72.5 fL — ABNORMAL LOW (ref 80.0–100.0)
Platelets: 194 10*3/uL (ref 150–400)
RBC: 3.6 MIL/uL — ABNORMAL LOW (ref 4.22–5.81)
RDW: 23.9 % — ABNORMAL HIGH (ref 11.5–15.5)
WBC: 12.6 10*3/uL — ABNORMAL HIGH (ref 4.0–10.5)
nRBC: 3.3 % — ABNORMAL HIGH (ref 0.0–0.2)

## 2022-08-30 LAB — GLUCOSE, CAPILLARY
Glucose-Capillary: 124 mg/dL — ABNORMAL HIGH (ref 70–99)
Glucose-Capillary: 140 mg/dL — ABNORMAL HIGH (ref 70–99)
Glucose-Capillary: 149 mg/dL — ABNORMAL HIGH (ref 70–99)
Glucose-Capillary: 151 mg/dL — ABNORMAL HIGH (ref 70–99)
Glucose-Capillary: 157 mg/dL — ABNORMAL HIGH (ref 70–99)
Glucose-Capillary: 207 mg/dL — ABNORMAL HIGH (ref 70–99)
Glucose-Capillary: 236 mg/dL — ABNORMAL HIGH (ref 70–99)

## 2022-08-30 LAB — SODIUM: Sodium: 144 mmol/L (ref 135–145)

## 2022-08-30 MED ORDER — LOSARTAN POTASSIUM 50 MG PO TABS
100.0000 mg | ORAL_TABLET | Freq: Every day | ORAL | Status: DC
  Administered 2022-08-30 – 2022-08-31 (×2): 100 mg
  Filled 2022-08-30 (×2): qty 2

## 2022-08-30 MED ORDER — HEPARIN SODIUM (PORCINE) 1000 UNIT/ML IJ SOLN
INTRAMUSCULAR | Status: AC
  Administered 2022-08-30: 2800 [IU]
  Filled 2022-08-30: qty 4

## 2022-08-30 MED ORDER — NEPRO/CARBSTEADY PO LIQD
1000.0000 mL | ORAL | Status: DC
  Administered 2022-08-30 – 2022-09-01 (×2): 1000 mL
  Filled 2022-08-30 (×2): qty 1000

## 2022-08-30 MED ORDER — ISOSORBIDE MONONITRATE 10 MG PO TABS
15.0000 mg | ORAL_TABLET | Freq: Two times a day (BID) | ORAL | Status: DC
  Administered 2022-08-30: 15 mg
  Filled 2022-08-30 (×2): qty 1.5

## 2022-08-30 MED ORDER — HYDRALAZINE HCL 20 MG/ML IJ SOLN
10.0000 mg | Freq: Four times a day (QID) | INTRAMUSCULAR | Status: DC | PRN
  Administered 2022-08-30 – 2022-09-14 (×11): 10 mg via INTRAVENOUS
  Filled 2022-08-30 (×11): qty 1

## 2022-08-30 MED ORDER — LOSARTAN POTASSIUM 50 MG PO TABS: 100.0000 mg | ORAL_TABLET | Freq: Every day | ORAL | Status: DC

## 2022-08-30 MED ORDER — ISOSORBIDE MONONITRATE ER 30 MG PO TB24: 30.0000 mg | ORAL_TABLET | Freq: Every day | ORAL | Status: DC

## 2022-08-30 MED ORDER — VITAL 1.5 CAL PO LIQD: 1000.0000 mL | ORAL | Status: DC

## 2022-08-30 MED ORDER — ISOSORBIDE MONONITRATE 10 MG PO TABS
15.0000 mg | ORAL_TABLET | Freq: Two times a day (BID) | ORAL | Status: DC
  Filled 2022-08-30 (×2): qty 1.5

## 2022-08-30 MED ORDER — HYDRALAZINE HCL 20 MG/ML IJ SOLN: 10.0000 mg | Freq: Four times a day (QID) | INTRAMUSCULAR | Status: DC | PRN

## 2022-08-30 MED ORDER — NEPRO/CARBSTEADY PO LIQD
1000.0000 mL | ORAL | Status: DC
  Filled 2022-08-30: qty 1185

## 2022-08-30 NOTE — Progress Notes (Signed)
[facility] Progress Note   Subjective:  Dialyzed this AM, 2L removed - no issues per RN. Remains sedated on vent.   Objective Vitals:   08/30/22 1100 08/30/22 1115 08/30/22 1130 08/30/22 1145  BP: (!) 181/73 (!) 174/75 (!) 176/72 (!) 179/75  Pulse: 75 74 74 74  Resp: '20 19 20 20  '$ Temp: 99.3 F (37.4 C) 99.3 F (37.4 C) 99.1 F (37.3 C) 98.8 F (37.1 C)  TempSrc:      SpO2: 98% 98% 98% 99%  Weight:      Height:       Physical Exam General: Sedated, on vent. Orally intubated, NG tube in place Heart: RRR; no murmur Lungs: CTA anteriorly, vent-assisted Abdomen: soft Extremities: No LE edema Dialysis Access: L AVF + thrill  Additional Objective Labs: Basic Metabolic Panel: Recent Labs  Lab 08/26/22 1742 08/26/22 2130 08/27/22 0545 08/27/22 1327 08/28/22 0549 08/28/22 0957 08/29/22 1154 08/29/22 1600 08/29/22 2006 08/30/22 0500  NA 147*  149*   < > 148*   < > 150*   < > 148*  149* 148* 149* 148*  K 5.1  --  5.5*  --  6.6*  --  5.5* 6.0*  --  6.4*  CL 119*  --  116*  --  119*  --  114* 113*  --  115*  CO2 23  --  24  --  21*  --  22 22  --  19*  GLUCOSE 231*  --  254*  --  222*  --  77 133*  --  271*  BUN 30*  --  29*  --  59*  --  64* 73*  --  104*  CREATININE 5.64*  --  4.80*  --  7.07*  --  6.59* 7.02*  --  8.50*  CALCIUM 7.6*  --  7.8*  --  8.5*  --  8.5* 8.7*  --  8.7*  PHOS 2.5  --  3.5  --  4.6  --   --   --   --   --    < > = values in this interval not displayed.   Liver Function Tests: Recent Labs  Lab 08/24/22 2330 08/25/22 1621 08/26/22 1742 08/27/22 0545 08/28/22 0549  AST 15  --   --   --  15  ALT 17  --   --   --  14  ALKPHOS 67  --   --   --  64  BILITOT 0.6  --   --   --  0.5  PROT 7.0  --   --   --  6.2*  ALBUMIN 3.4*   < > 2.4* 2.1* 2.1*   < > = values in this interval not displayed.   CBC: Recent Labs  Lab 08/24/22 2330 08/25/22 0151 08/27/22 1450 08/27/22 1951 08/28/22 0549 08/29/22 0500 08/30/22 0500   WBC 9.3   < > 19.4* 15.5* 15.5* 11.7* 12.6*  NEUTROABS 6.3  --   --   --   --   --   --   HGB 9.2*  10.5*   < > 6.5* 8.4* 8.8* 8.1* 8.3*  HCT 29.5*  31.0*   < > 22.1* 25.3* 27.1* 25.3* 26.1*  MCV 66.1*   < > 71.8* 72.1* 72.1* 71.9* 72.5*  PLT 280   < > 152 157 171 174 194   < > = values in this interval not displayed.   Blood Culture    Component Value  Date/Time   SDES TRACHEAL ASPIRATE 08/26/2022 1212   SPECREQUEST NONE 08/26/2022 1212   CULT  08/26/2022 1212    ABUNDANT STREPTOCOCCUS ANGINOSIS RARE KLEBSIELLA OXYTOCA ABUNDANT HAEMOPHILUS INFLUENZAE BETA LACTAMASE NEGATIVE Performed at [facility], [place] [address], [place], [place] [address]    REPTSTATUS 08/29/2022 FINAL 08/26/2022 1212   CBG: Recent Labs  Lab 08/29/22 1959 08/30/22 0011 08/30/22 0454 08/30/22 0736 08/30/22 1140  GLUCAP 173* 151* 236* 124* 140*   Iron Studies:  Recent Labs    08/29/22 1600  IRON 22*  TIBC 99*   Studies/Results: EEG adult  Result Date: 08/29/2022 Lora Havens, MD     08/29/2022 11:46 AM Patient Name: Keylen Eckenrode MRN: [id number] Epilepsy Attending: Lora Havens Referring Physician/Provider: Janine Ores, NP Date: 08/29/2022 Duration: 21.40 mins Patient history: 51yo M with ams. EEG to evaluate for seizure. Level of alertness:  lethargic AEDs during EEG study: propofol Technical aspects: This EEG study was done with scalp electrodes positioned according to the 10-20 International system of electrode placement. Electrical activity was reviewed with band pass filter of 1-'70Hz'$ , sensitivity of 7 uV/mm, display speed of 53m/sec with a '60Hz'$  notched filter applied as appropriate. EEG data were recorded continuously and digitally stored.  Video monitoring was available and reviewed as appropriate. Description: EEG showed continuous generalized 3 to 6 Hz theta-delta slowing. Intermittent generalized periodic discharges with triphasic morphology at 1 Hz were also noted, more  prominent when awake/stimulated. Hyperventilation and photic stimulation were not performed.   ABNORMALITY - Periodic discharges with triphasic morphology, generalized ( GPDs) - Continuous slow, generalized IMPRESSION: This study showed generalized periodic discharges with triphasic morphology which is on the ictal-interictal continuum. However, the frequency, morphology and reactivity to stimulation is most likely indicative of toxic-metabolic causes. . Additionally there is moderate to severe diffuse encephalopathy, nonspecific etiology. No seizures were seen throughout the recording. [place]  CT HEAD WO CONTRAST (5MM)  Result Date: 08/29/2022 CLINICAL DATA:  Intracranial hemorrhage EXAM: CT HEAD WITHOUT CONTRAST TECHNIQUE: Contiguous axial images were obtained from the base of the skull through the vertex without intravenous contrast. RADIATION DOSE REDUCTION: This exam was performed according to the departmental dose-optimization program which includes automated exposure control, adjustment of the mA and/or kV according to patient size and/or use of iterative reconstruction technique. COMPARISON:  None Available. FINDINGS: Brain: Unchanged intraparenchymal hemorrhage in the right cerebellar hemisphere with possible minimal extension into the fourth ventricle. No hydrocephalus. Vascular: No abnormal hyperdensity of the major intracranial arteries or dural venous sinuses. No intracranial atherosclerosis. Skull: The visualized skull base, calvarium and extracranial soft tissues are normal. Sinuses/Orbits: No fluid levels or advanced mucosal thickening of the visualized paranasal sinuses. No mastoid or middle ear effusion. The orbits are normal. IMPRESSION: Unchanged intraparenchymal hemorrhage in the right cerebellar hemisphere with possible minimal extension into the fourth ventricle. No hydrocephalus. Electronically Signed   By: KUlyses JarredM.D.   On: 08/29/2022 03:44    Medications:  sodium  chloride     sodium chloride Stopped (08/30/22 0033)   cefTRIAXone (ROCEPHIN)  IV Stopped (08/29/22 2033)   feeding supplement (PEPTAMEN 1.5 CAL) 1,000 mL (08/30/22 1022)   [START ON 08/31/2022] feeding supplement (VITAL 1.5 CAL)     fentaNYL infusion INTRAVENOUS 75 mcg/hr (08/30/22 0900)   propofol (DIPRIVAN) infusion Stopped (08/29/22 0332)    amLODipine  5 mg Per Tube BID   carvedilol  25 mg Per Tube BID WC   Chlorhexidine Gluconate Cloth  6 each Topical Daily   Chlorhexidine Gluconate Cloth  6 each Topical Q0600   Chlorhexidine Gluconate Cloth  6 each Topical Q0600   darbepoetin (ARANESP) injection - DIALYSIS  150 mcg Subcutaneous Q Sun-1800   docusate  100 mg Per Tube BID   feeding supplement (PROSource TF20)  60 mL Per Tube TID   insulin aspart  0-15 Units Subcutaneous Q4H   insulin aspart  7 Units Subcutaneous Q4H   multivitamin  1 tablet Per Tube QHS   mouth rinse  15 mL Mouth Rinse Q2H   pantoprazole (PROTONIX) IV  40 mg Intravenous Daily   polyethylene glycol  17 g Per Tube Daily    Dialysis Orders: SW MWF 3h 65mn  80kg   400/800   2/2 bath  Hep 2000  LFA AVF - last HD 11/13, post 80.1kg - mircera 150 q2, last 11/13 - hectorol 435m IV q HD  Assessment/Plan: 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 Goal Na was 148-152. Na 148 today. Neuro note indicates will let Na fall to normal at this time. Uncont HTN - Off IV cleviprex now, getting coreg and norvasc per tube, BP higher today despite 2L off with HD today. Closer to dry weight. Can add back isosorbide/losartan if needed (home meds). Hyperkalemia - recurrent issue, K 6.4 today (pre-HD). Now that Na goal is lowered, will try to get on lower K TF forumation. ESRD: Usual MWF schedule, required CRRT 11/15-11/17/23. HD today due to hyperkalemia - evaluating on daily basis, more than likely will not be on our holiday schedule this week - plan for next HD Wed 11/22. Resp Failure: On Vent, per ICU  teams. Anemia of ESRD: Hgb 8.3 today, continue Aranesp 15057mweekly while here. Secondary HPTH: Ca/Phos ok.  CCa and phos in range. Continue VDRA. Resume binder when eating.  DM2 - per pmd  KatVeneta PentonA-C 08/30/2022, 12:30 PM  [facility]

## 2022-08-30 NOTE — Progress Notes (Signed)
Critical potassium of 6.4. Stroke notified.

## 2022-08-30 NOTE — Progress Notes (Addendum)
STROKE TEAM PROGRESS NOTE   INTERVAL HISTORY Patient is seen in his room with no family at the bedside.  His potassium was 6.4 today, and he is receiving dialysis now. Na 148 this morning.  Will recheck potassium now.  Vitals:   08/30/22 0845 08/30/22 0902 08/30/22 0912 08/30/22 0915  BP: (!) 195/75 (!) 226/80 (!) 162/66 (!) 168/68  Pulse: 73 74 70 70  Resp: '16 20 14 14  '$ Temp: 98.4 F (36.9 C) 99 F (37.2 C) 99.5 F (37.5 C) 99.5 F (37.5 C)  TempSrc:      SpO2: 100% 100% 96% 98%  Weight:      Height:       CBC:  Recent Labs  Lab 08/24/22 2330 08/25/22 0151 08/29/22 0500 08/30/22 0500  WBC 9.3   < > 11.7* 12.6*  NEUTROABS 6.3  --   --   --   HGB 9.2*  10.5*   < > 8.1* 8.3*  HCT 29.5*  31.0*   < > 25.3* 26.1*  MCV 66.1*   < > 71.9* 72.5*  PLT 280   < > 174 194   < > = values in this interval not displayed.    Basic Metabolic Panel:  Recent Labs  Lab 08/27/22 0142 08/27/22 0545 08/27/22 1327 08/28/22 0549 08/28/22 0957 08/29/22 1600 08/29/22 2006 08/30/22 0500  NA 150* 148*   < > 150*   < > 148* 149* 148*  K  --  5.5*  --  6.6*   < > 6.0*  --  6.4*  CL  --  116*  --  119*   < > 113*  --  115*  CO2  --  24  --  21*   < > 22  --  19*  GLUCOSE  --  254*  --  222*   < > 133*  --  271*  BUN  --  29*  --  59*   < > 73*  --  104*  CREATININE  --  4.80*  --  7.07*   < > 7.02*  --  8.50*  CALCIUM  --  7.8*  --  8.5*   < > 8.7*  --  8.7*  MG 2.1  --   --  2.5*  --   --   --   --   PHOS  --  3.5  --  4.6  --   --   --   --    < > = values in this interval not displayed.    Lipid Panel:  Recent Labs  Lab 08/26/22 0410 08/29/22 0450  CHOL 186  --   TRIG 372*  355* 101  HDL 21*  --   CHOLHDL 8.9  --   VLDL 74*  --   LDLCALC 91  --     HgbA1c:  Recent Labs  Lab 08/25/22 0231  HGBA1C 5.9*    Urine Drug Screen: No results for input(s): "LABOPIA", "COCAINSCRNUR", "LABBENZ", "AMPHETMU", "THCU", "LABBARB" in the last 168 hours.  Alcohol Level  Recent  Labs  Lab 08/24/22 2330  ETH <10     IMAGING past 24 hours EEG adult  Result Date: 08/29/2022 Lora Havens, MD     08/29/2022 11:46 AM Patient Name: Randy Ross MRN: [id number] Epilepsy Attending: Lora Havens Referring Physician/Provider: Janine Ores, NP Date: 08/29/2022 Duration: 21.40 mins Patient history: 51yo M with ams. EEG to evaluate for seizure. Level of alertness:  lethargic AEDs during EEG  study: propofol Technical aspects: This EEG study was done with scalp electrodes positioned according to the 10-20 International system of electrode placement. Electrical activity was reviewed with band pass filter of 1-'70Hz'$ , sensitivity of 7 uV/mm, display speed of 56m/sec with a '60Hz'$  notched filter applied as appropriate. EEG data were recorded continuously and digitally stored.  Video monitoring was available and reviewed as appropriate. Description: EEG showed continuous generalized 3 to 6 Hz theta-delta slowing. Intermittent generalized periodic discharges with triphasic morphology at 1 Hz were also noted, more prominent when awake/stimulated. Hyperventilation and photic stimulation were not performed.   ABNORMALITY - Periodic discharges with triphasic morphology, generalized ( GPDs) - Continuous slow, generalized IMPRESSION: This study showed generalized periodic discharges with triphasic morphology which is on the ictal-interictal continuum. However, the frequency, morphology and reactivity to stimulation is most likely indicative of toxic-metabolic causes. . Additionally there is moderate to severe diffuse encephalopathy, nonspecific etiology. No seizures were seen throughout the recording. Priyanka OBarbra Sarks   PHYSICAL EXAM  Temp:  [97.2 F (36.2 C)-100.8 F (38.2 C)] 99.5 F (37.5 C) (11/20 0915) Pulse Rate:  [64-75] 70 (11/20 0915) Resp:  [11-26] 14 (11/20 0915) BP: (115-226)/(58-85) 168/68 (11/20 0915) SpO2:  [96 %-100 %] 98 % (11/20 0915) Arterial Line BP:  (120-200)/(54-78) 184/74 (11/20 0801) FiO2 (%):  [40 %] 40 % (11/20 0902) Weight:  [83.6 kg] 83.6 kg (11/20 0530)  General - Well nourished, well developed, intubated on sedation with fentanyl only  Ophthalmologic - fundi not visualized due to noncooperation.  Cardiovascular - Regular rate and rhythm.  Neuro - intubated on sedation, eyes closed, not following commands. With forced eye opening, both eye in mid position, not blinking to visual threat, not tracking, PERRL. Cough present. Breathing over the vent.  Facial symmetry not able to test due to ET tube.  Tongue protrusion not cooperative. No movement of extremities to noxious stimuli.  Sensation, coordination and gait not tested.     ASSESSMENT/PLAN Mr. VRhone Ozakiis a 51y.o. male with history of end-stage renal disease on intermittent hemodialysis, diabetes, hypertension presented with  witnessed onset slurred speech, with headache at 10:30 PM for which EMS was activated.  On their arrival they report blood pressure was 260/140, glucose 126.  He was found to have a right sided cerebellar ICH.  Neurosurgery was consulted for possible EVD placement, but they have no plans for operative intervention at this point.  ICH remains stable on CT scan.  Patient has been receiving IHD.  ICH: Acute right cerebellar small ICH, likely due to chronic uncontrolled hypertension  Code Stroke CT head Acute intraparenchymal hematoma centered in the right cerebellar hemisphere with severe mass effect on the fourth ventricle and cerebral aqueduct. No hydrocephalus. CT repeat - hemorrhage size stable from earlier today. There is likely extension into the fourth ventricle but no intraventricular spread or hydrocephalus. CT head and neck premature atherosclerosis with notable vessel tortuosity suggesting chronic hypertension CT repeat 11/16 stable hematoma and no hydrocephalus CT Head 11/17 1. Small acute infarct at the right corpus callosum body and  cingulate white matter, not seen on admission head CT. 2. Non progressed right cerebellar hematoma and swelling. No hydrocephalus. Will consider MRI with and without contrast once stable 2D Echo EF 50 to 55% EEG- This study showed generalized periodic discharges with triphasic morphology which is on the ictal-interictal continuum. However, the frequency, morphology and reactivity to stimulation is most likely indicative of toxic-metabolic causes. . Additionally there is moderate to  severe diffuse encephalopathy, nonspecific etiology. No seizures were seen throughout the recording.  LDL 91 HgbA1c 5.9 VTE prophylaxis - SCD's No antithrombotic prior to admission, now on No antithrombotic.  Therapy recommendations:  pending Disposition:  pending  Cerebral Edema On 3% saline protocol -> off now due to fluid status Last NA 143->148->148 -> 144, with NA goal 150 Serial NA checks Q4h -> 23.4% PRN to keep Na goal around 150 11/18- 23.4% bolus ordered NSGY following - no surgical intervention at this time  Will allow Na to normalize today, no further HTS boluses unless change in exam or worsening of cerebral edema on CT  ESRD on HD HD MWF Renal Dr. Jonnie Finner following, appreciate help Had HD catheter CRRT stopped 11/17 due to severe anemia -> transition to HD HTS with 23.4% boluses PRN  Acute hypoxic Respiratory failure  Vent management per CCM  On Fentanyl for sedation  Versed as needed for agitation on vent Not candidate for extubation  Hypertension Home meds:  norvasc 10 mg, coreg 25 mg, imdur 30 mg, losartan 100 mg- on hold  UnStable Cleviprex still ongoing Resume home norvasc and coreg SBP goal < 160 BP goal normotensive  Hyperlipidemia Home meds:  none LDL 91 goal < 70 Consider statin at discharge  Diabetes type II Controlled Home meds:  Semglee HgbA1c 5.9, goal < 7.0 CBGs SSI  dysphagia  OG tube and cortrak placed [facility] day # 6   Patient seen  and examined by NP/APP with MD. MD to update note as needed.   [place] , MSN, AGACNP-BC [facility] See Amion for schedule and pager information 08/30/2022 12:11 PM  ATTENDING ATTESTATION:   35 with end-stage renal disease now on hemodialysis.  Has a right cerebellar intracranial hemorrhage.  CCM to continue to wean sedation and vent weaning.  Sodium is 148 this morning and will trend it down slowly now that he is day 6 post hemorrhage.  Potassium is elevated 6.4 undergoing dialysis today.  Continue check labs.  Continue frequent neurochecks.  On exam he is intubated and sedated lightly with fentanyl only as propofol was stopped.  He had forced eye closure and attempting to open.  Pupils equal round reactive.  He did not follow commands.  No withdrawal to pain to noxious stimuli.  Will discuss with family when they arrive.  Suspect that his kidney disease is contributing to his altered mental status.  EEG yesterday is consistent with metabolic encephalopathy.   Appreciate CCM and nephrology assistance.     Dr. Reeves Forth evaluated pt independently, reviewed imaging, chart, labs. Discussed and formulated plan with the Resident/APP. Changes were made to the note where appropriate. Please see APP/resident note above for details.     This patient is critically ill due to cerebellar ICH, cerebral edema, respiratory failure, ESRD, hypertensive emergency and at significant risk of neurological worsening, death form hematoma expansion, brain herniation, obstructive hydrocephalus, seizure. This patient's care requires constant monitoring of vital signs, hemodynamics, respiratory and cardiac monitoring, review of multiple databases, neurological assessment, discussion with family, other specialists and medical decision making of high complexity. I spent 35 minutes of neurocritical care time in the care of this patient.    Rafan Sanders,MD     To contact Stroke Continuity provider,  please refer to [email or web address] After hours, contact General Neurology

## 2022-08-30 NOTE — Progress Notes (Signed)
   08/30/22 0936  Vitals  Temp 99.7 F (37.6 C)  BP (!) 183/71  Pulse Rate 74  Resp 18  Oxygen Therapy  SpO2 97 %  O2 Device Ventilator  Patient Activity (if Appropriate) In bed  Pulse Oximetry Type Continuous  Post Treatment  Dialyzer Clearance Clear  Duration of HD Treatment -hour(s) 330 hour(s)  Liters Processed 84  Fluid Removed (mL) 2000 mL   Received patient in bed to unit.  Alert and oriented.  Informed consent signed and in chart.   Treatment initiated: 550a Treatment completed: 936a  Patient tolerated well.  Transported back to the room  Alert, without acute distress.  Hand-off given to patient's nurse.   Access used: Yes Access issues: No  Total UF removed: 2056m Medication(s) Fentanyl 50 mcg, labetalol '20mg'$  Post HD VS: 183/71, 74, 18,Spo2 99, 37.6 Post HD weight: 80.9kg   [facility]Laver[facility]sonK[facility]ney Dialysis [facility]

## 2022-08-30 NOTE — Progress Notes (Signed)
NAMEEsaiah Ross, MRN:  [id number], DOB:  1971/08/03, LOS: 6 ADMISSION DATE:  08/24/2022, CONSULTATION DATE:  08/25/2022 REFERRING MD:  [place] - Neuro CHIEF COMPLAINT:  AMS   History of Present Illness:  51 year old man who presented to [facility] ED 11/14 with left sided visual preference, left sided weakness, slurred speech. He was also profoundly hypertensive with SBP over 200s (highest documented is SBP 230). PMHx significant for HTN, T2DM, ESRD on HD (MWF).  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 He was intubated in the ED and neurosurgery was consulted for consideration of EVD.  He was admitted by neurology and PCCM was called in consultation for vent management.  Pertinent Medical History:   Past Medical History:  Diagnosis Date   Anemia    CKD (chronic kidney disease)    on wed 12/09/21   Diabetes mellitus without complication ([place])    type 2   Hypertension    Renal disorder    Significant Hospital Events: Including procedures, antibiotic start and stop dates in addition to other pertinent events   11/15 - Admitted overnight. 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  CTA Head/Neck without e/o vascular lesion at R cerebellar hemorrhage. HTS 3% initiated. Trialysis catheter placed L IJ for CRRT/HTS 23% (per Stroke team). CRRT initiated. 11/16 Low grade temp with Tmax 99.4, WBC uptrending 25.9 (9.0). CXR with increased RLL opacities. Cefepime initiated. Resp Cx/TA > mult spec h.flu, strep species, klebsiella species. TG elevated, weaning prop. 11/17 Ongoing low grade temps Tmax 100.55F. WBC downtrending 19.3 (25.9). CRRT filter clotting causing blood loss, Hgb 6.3. CRRT stopped, 1U PRBCs transfused. HD to resume. Repeat CT Head with small acute infarct at R corpus  callosum body/cingulate white matter, stable R cerebellar hematoma. CXR improved on cefepime. 11/19 stirs to touch  Interim History / Subjective:   No acute events overnight.  Getting HD this morning Remains hypertensive.  Lung mechanics look great on PSV 5/5  Objective:  Blood pressure (!) 183/71, pulse 74, temperature 99.7 F (37.6 C), resp. rate 18, height '5\' 5"'$  (1.651 m), weight 80.9 kg, SpO2 95 %.    Vent Mode: PSV;CPAP FiO2 (%):  [40 %] 40 % Set Rate:  [16 bmp] 16 bmp Vt Set:  [490 mL] 490 mL PEEP:  [5 cmH20] 5 cmH20 Pressure Support:  [5 cmH20] 5 cmH20 Plateau Pressure:  [14 cmH20] 14 cmH20   Intake/Output Summary (Last 24 hours) at 08/30/2022 1001 Last data filed at 08/30/2022 1749 Gross per 24 hour  Intake 1209.55 ml  Output 2001 ml  Net -791.45 ml    Filed Weights   08/28/22 1138 08/30/22 0530 08/30/22 0936  Weight: 83.6 kg 83.6 kg 80.9 kg   Physical Examination:  General:  middle aged male of normal body habitus on vent HENT: [place] I/AT, no JVD PULM: Clear bilateral breath sounds CV: RRR, no mgr GI: BS+, soft, nontender MSK: normal bulk and tone Neuro: sedated on vent, stirs to touch   Labs/imaging personally reviewed:  CT head 11/4 > 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 CTA head 11/14 > no e/o vascular lesion at R cerebellar hemorrhage  Assessment & Plan:   Acute IPH with severe mass effect/brain swelling -  presumed hypertensive in nature Repeat CT Head with small acute infarct at R corpus callosum body/cingulate white matter, stable R cerebellar hematoma. Hypertensive emergency - hx of HTN, unsure of compliance with med regimen - Stroke service primary - AC/VTE ppx per primary - Frequent neuro checks.  - Carvedilol, amlodipine. Increase amlodipine.  - PRN hydralazine, labetalol.  - SBP goal < 120mHg - Sodium goal ~ 150. Using 23.5% boluses to  keep per neuro (had to stop 3% considering fluid status in ESRD)  Acute hypoxic respiratory failure with inability to protect the airway - s/p intubation in ED PNA RLL due to klebsiella, strep, h.flu - VAP prevention - Ceftriaxone - Lung mechanics look great on SBT, continue - Encephalopathy remains barrier to extubation - SBT during the day as tolerated. Rest on full support.  - DC propofol.  Fentanyl infusion for RASS goal 0 to -1.   Hx ESRD on HD MWF Hyperkalemia 11/18 Permissive hypernatremia - Per nephrology - HD RN using temp cath for therapy. Hopefully we can remove.  - Trend chemistry  Anemia 2/2 acute blood loss associated with CRRT - Transitioned to HD - Trend CBC - Transfuse per ICU guidelines.   DM2 with hyperglycemia - CBG monitoring and SSI  At risk for malnutrition - TF    Best practice (evaluated daily):  Diet/type: NPO DVT prophylaxis: not indicated GI prophylaxis: PPI Lines: N/A Foley:  N/A Code Status:  full code Last date of multidisciplinary goals of care discussion: Per primary.  Critical care time: 35 minutes    PGeorgann Housekeeper AGACNP-BC [facility]  See Amion for personal pager PCCM on call pager ([phone] 7pm. Please call Elink 7p-7a. [phone] 08/30/2022 10:17 AM

## 2022-08-30 NOTE — Progress Notes (Signed)
   08/30/22 6945  Hemodialysis Catheter Left Internal jugular  Placement Date/Time: 08/25/22 1400   Placed prior to admission: No  Person Inserting LDA: Nevada Crane PA  Orientation: Left  Access Location: Internal jugular  Site Condition No complications  Blue Lumen Status Heparin locked;Flushed  Red Lumen Status Heparin locked;Flushed  Catheter fill solution Heparin 1000 units/ml  Catheter fill volume (Arterial) 1.4 cc  Catheter fill volume (Venous) 1.4  Dressing Type Transparent  Dressing Status Clean, Dry, Intact;Antimicrobial disc in place  Drainage Description None  Post treatment catheter status Capped and Clamped

## 2022-08-30 NOTE — Progress Notes (Signed)
Pt vomited large amt of liquid yellow. Tube feeds on hold

## 2022-08-30 NOTE — Progress Notes (Signed)
Nutrition Follow-up  DOCUMENTATION CODES:   Not applicable  INTERVENTION:   Tube feeding via Cortrak tube: D/C Vital 1.5  Nepro at 45 ml/h (1080 ml per day) Prosource TF20 60 ml TID  Provides 2181 kcal, 147 gm protein, 788 ml free water daily  Renal MVI   NUTRITION DIAGNOSIS:   Inadequate oral intake related to inability to eat as evidenced by NPO status. Ongoing.   GOAL:   Patient will meet greater than or equal to 90% of their needs Met with TF at goal   MONITOR:   Vent status, Labs, Weight trends, TF tolerance, I & O's  REASON FOR ASSESSMENT:   Ventilator, Consult Enteral/tube feeding initiation and management  ASSESSMENT:   51-year-old male who presented to the ED on 11/14 as a Code Stroke with AMS, L sided weakness, slurred speech, L sided gaze preference. PMH of ESRD on HD, DM, HTN. Pt required intubation in the ED. Pt admitted with ICH.  Pt discussed during ICU rounds and with RN.  Per MD/RN pt is not following commands.   11/15 - s/p cortrak placement; tip at gastric antrum or duodenal bulb    Medications reviewed and include: aranesp, colace, SSI, 7 units novolog every 4 hours, rena-vit, protonix, miralax  Fentanyl   Labs reviewed: Na 148 (MD allowing elevated Na), K 6.4 CBG's: 124-236  UF: 2000 ml   Diet Order:   Diet Order             Diet NPO time specified  Diet effective now                   EDUCATION NEEDS:   No education needs have been identified at this time  Skin:  Skin Assessment: Reviewed RN Assessment  Last BM:  11/20  Height:   Ht Readings from Last 1 Encounters:  08/25/22 5' 5" (1.651 m)    Weight:   Wt Readings from Last 1 Encounters:  08/30/22 80.9 kg    Ideal Body Weight:  61.8 kg  BMI:  Body mass index is 29.68 kg/m.  Estimated Nutritional Needs:   Kcal:  2100-2300  Protein:  130-150 grams  Fluid:  1000 ml + UOP   P., RD, LDN, CNSC See AMiON for contact information   

## 2022-08-31 ENCOUNTER — Inpatient Hospital Stay (HOSPITAL_COMMUNITY): Payer: No Typology Code available for payment source

## 2022-08-31 LAB — HEPATIC FUNCTION PANEL
ALT: 18 U/L (ref 0–44)
AST: 19 U/L (ref 15–41)
Albumin: 1.9 g/dL — ABNORMAL LOW (ref 3.5–5.0)
Alkaline Phosphatase: 84 U/L (ref 38–126)
Bilirubin, Direct: <0.1 mg/dL (ref 0.0–0.2)
Total Bilirubin: 0.2 mg/dL — ABNORMAL LOW (ref 0.3–1.2)
Total Protein: 6.3 g/dL — ABNORMAL LOW (ref 6.5–8.1)

## 2022-08-31 LAB — SODIUM
Sodium: 143 mmol/L (ref 135–145)
Sodium: 142 mmol/L (ref 135–145)
Sodium: 144 mmol/L (ref 135–145)
Sodium: 143 mmol/L (ref 135–145)
Sodium: 143 mmol/L (ref 135–145)

## 2022-08-31 LAB — CBC
HCT: 24.7 % — ABNORMAL LOW (ref 39.0–52.0)
Hemoglobin: 7.7 g/dL — ABNORMAL LOW (ref 13.0–17.0)
MCH: 22.7 pg — ABNORMAL LOW (ref 26.0–34.0)
MCHC: 31.2 g/dL (ref 30.0–36.0)
MCV: 72.9 fL — ABNORMAL LOW (ref 80.0–100.0)
Platelets: 214 10*3/uL (ref 150–400)
RBC: 3.39 MIL/uL — ABNORMAL LOW (ref 4.22–5.81)
RDW: 24.4 % — ABNORMAL HIGH (ref 11.5–15.5)
WBC: 9.8 10*3/uL (ref 4.0–10.5)
nRBC: 2.3 % — ABNORMAL HIGH (ref 0.0–0.2)

## 2022-08-31 LAB — BASIC METABOLIC PANEL
Anion gap: 15 (ref 5–15)
BUN: 94 mg/dL — ABNORMAL HIGH (ref 6–20)
CO2: 25 mmol/L (ref 22–32)
Calcium: 8.9 mg/dL (ref 8.9–10.3)
Chloride: 102 mmol/L (ref 98–111)
Creatinine, Ser: 6.64 mg/dL — ABNORMAL HIGH (ref 0.61–1.24)
GFR, Estimated: 9 mL/min — ABNORMAL LOW (ref >60)
Glucose, Bld: 211 mg/dL — ABNORMAL HIGH (ref 70–99)
Potassium: 5.6 mmol/L — ABNORMAL HIGH (ref 3.5–5.1)
Sodium: 142 mmol/L (ref 135–145)

## 2022-08-31 LAB — PHOSPHORUS: Phosphorus: 6.2 mg/dL — ABNORMAL HIGH (ref 2.5–4.6)

## 2022-08-31 LAB — TSH: TSH: 2.142 u[IU]/mL (ref 0.350–4.500)

## 2022-08-31 LAB — GLUCOSE, CAPILLARY
Glucose-Capillary: 133 mg/dL — ABNORMAL HIGH (ref 70–99)
Glucose-Capillary: 152 mg/dL — ABNORMAL HIGH (ref 70–99)
Glucose-Capillary: 187 mg/dL — ABNORMAL HIGH (ref 70–99)
Glucose-Capillary: 215 mg/dL — ABNORMAL HIGH (ref 70–99)
Glucose-Capillary: 219 mg/dL — ABNORMAL HIGH (ref 70–99)
Glucose-Capillary: 85 mg/dL (ref 70–99)

## 2022-08-31 LAB — MAGNESIUM: Magnesium: 2.6 mg/dL — ABNORMAL HIGH (ref 1.7–2.4)

## 2022-08-31 LAB — T4, FREE: Free T4: 0.79 ng/dL (ref 0.61–1.12)

## 2022-08-31 LAB — AMMONIA: Ammonia: 28 umol/L (ref 9–35)

## 2022-08-31 MED ORDER — THIAMINE HCL 100 MG/ML IJ SOLN
500.0000 mg | Freq: Three times a day (TID) | INTRAVENOUS | Status: AC
  Administered 2022-08-31 – 2022-09-03 (×8): 500 mg via INTRAVENOUS
  Filled 2022-08-31 (×9): qty 5

## 2022-08-31 MED ORDER — ISOSORBIDE MONONITRATE ER 30 MG PO TB24: 15.0000 mg | ORAL_TABLET | Freq: Two times a day (BID) | ORAL | Status: DC

## 2022-08-31 MED ORDER — SODIUM ZIRCONIUM CYCLOSILICATE 10 G PO PACK
10.0000 g | PACK | Freq: Every day | ORAL | Status: DC
  Administered 2022-08-31 – 2022-09-03 (×4): 10 g
  Filled 2022-08-31 (×4): qty 1

## 2022-08-31 NOTE — Progress Notes (Signed)
Patient ID: Randy Ross, male   DOB: 02-28-1971, 51 y.o.   MRN: [id number]  I updated several family members including her brother different from the one that was here this morning Who are in the room and answered the question by his prognosis and diagnosis.  Addressed that his alertness may be related to fentanyl medication in combination with his kidney disease requiring dialysis which may inhibit removal of metabolites.  We will try to minimize sedation overnight with fentanyl pushes only for severe coughing episodes.  I explained to them that serial CT scans been stable and we should expect for him to improve.  He understood and all her questions were answered to their satisfaction.  Approximately 10 minutes spent.

## 2022-08-31 NOTE — Progress Notes (Signed)
NAMEFirman Ross, MRN:  [id number], DOB:  [birth year], LOS: 7 ADMISSION DATE:  08/24/2022, CONSULTATION DATE:  08/25/2022 REFERRING MD:  [place] - Neuro CHIEF COMPLAINT:  AMS   History of Present Illness:  51 year old man who presented to [facility] ED 11/14 with left sided visual preference, left sided weakness, slurred speech. He was also profoundly hypertensive with SBP over 200s (highest documented is SBP 230). PMHx significant for HTN, T2DM, ESRD on HD (MWF).  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 He was intubated in the ED and neurosurgery was consulted for consideration of EVD.  He was admitted by neurology and PCCM was called in consultation for vent management.  Pertinent Medical History:   Past Medical History:  Diagnosis Date   Anemia    CKD (chronic kidney disease)    on wed 12/09/21   Diabetes mellitus without complication ([place])    type 2   Hypertension    Renal disorder    Significant Hospital Events: Including procedures, antibiotic start and stop dates in addition to other pertinent events   11/15 - Admitted overnight. 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  CTA Head/Neck without e/o vascular lesion at R cerebellar hemorrhage. HTS 3% initiated. Trialysis catheter placed L IJ for CRRT/HTS 23% (per Stroke team). CRRT initiated. 11/16 Low grade temp with Tmax 99.4, WBC uptrending 25.9 (9.0). CXR with increased RLL opacities. Cefepime initiated. Resp Cx/TA > mult spec h.flu, strep species, klebsiella species. TG elevated, weaning prop. 11/17 Ongoing low grade temps Tmax 100.24F. WBC downtrending 19.3 (25.9). CRRT filter clotting causing blood loss, Hgb 6.3. CRRT stopped, 1U PRBCs transfused. HD to resume. Repeat CT Head with small acute infarct at R corpus  callosum body/cingulate white matter, stable R cerebellar hematoma. CXR improved on cefepime. 11/19 stirs to touch  Interim History / Subjective:   Emesis x 1 last night.  Remains encephalopathic this morning.   Objective:  Blood pressure 131/62, pulse 68, temperature 98.1 F (36.7 C), resp. rate 13, height '5\' 5"'$  (1.651 m), weight 80.9 kg, SpO2 100 %.    Vent Mode: PSV;CPAP FiO2 (%):  [40 %] 40 % Set Rate:  [16 bmp] 16 bmp Vt Set:  [490 mL] 490 mL PEEP:  [5 cmH20] 5 cmH20 Pressure Support:  [5 cmH20] 5 cmH20   Intake/Output Summary (Last 24 hours) at 08/31/2022 0932 Last data filed at 08/31/2022 0600 Gross per 24 hour  Intake 1692.99 ml  Output 2000 ml  Net -307.01 ml    Filed Weights   08/28/22 1138 08/30/22 0530 08/30/22 0936  Weight: 83.6 kg 83.6 kg 80.9 kg   Physical Examination: General:  middle aged male in NAD HENT: [place]/AT, PERRL, no JVD PULM:Clear CV: RRR, no mgr GI: Soft, NT, ND MSK: Normal bulk and tone Neuro: Sedated on vent. Occasionally gags on tube.    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 CTA head 11/14 > no e/o vascular lesion at R cerebellar hemorrhage MRI brain 11/21 >  Assessment & Plan:   Acute IPH with severe mass effect/brain swelling - presumed hypertensive in nature Repeat CT Head with small acute infarct at R corpus callosum body/cingulate white  matter, stable R cerebellar hematoma. Hypertensive emergency - hx of HTN, unsure of compliance with med regimen Neurology thinking encephalopathy is out of proportion to [place].  - Stroke service primary - AC/VTE ppx per primary - Frequent neuro checks.  - Home Carvedilol, amlodipine, losartan continue. Imdur on hold.  - PRN hydralazine, labetalol.  - SBP goal < 1100mHg - Sodium goal ~ allow to normalize per stroke.  - Check ammonia, TSH, t4,  for osmolar gap, start high dose thiamine.   Acute hypoxic respiratory failure with inability to protect the airway - s/p intubation in ED PNA RLL due to klebsiella, strep, h.flu - VAP prevention - Ceftriaxone - Lung mechanics look great on SBT, continue - Encephalopathy remains barrier to extubation - SBT during the day as tolerated. Rest on full support.  - Fentanyl infusion for now. After MRI will DC drip and use PRNs to ensure no lingering effects.   Hx ESRD on HD MWF Hyperkalemia 11/18 Permissive hypernatremia - Per nephrology - HD RN using temp cath for therapy. Hopefully we can remove.  - Trend chemistry  Anemia 2/2 acute blood loss associated with CRRT - Transitioned to HD - Trend CBC - Transfuse per ICU guidelines.   DM2 with hyperglycemia - CBG monitoring and SSI  At risk for malnutrition - TF    Best practice (evaluated daily):  Diet/type: NPO DVT prophylaxis: not indicated GI prophylaxis: PPI Lines: N/A Foley:  N/A Code Status:  full code Last date of multidisciplinary goals of care discussion: Per primary.  Critical care time: 40 minutes    PGeorgann Housekeeper AGACNP-BC [facility]  See Amion for personal pager PCCM on call pager ([phone] 7pm. Please call Elink 7p-7a. [phone] 08/31/2022 9:32 AM

## 2022-08-31 NOTE — Progress Notes (Signed)
Patient getting Q4H Sodium checks despite hypertonic saline being off since Saturday. I am going to discontinue sodiumc checks. Patient has BMP ordered daily in AM which should suffice at this time.  [place] Pager Number [phone]

## 2022-08-31 NOTE — Progress Notes (Addendum)
STROKE TEAM PROGRESS NOTE   INTERVAL HISTORY Patient is seen in his room with his brother at the bedside.  He had an episode of vomiting overnight.  His vital signs have been stable.  MRI brain today shows scattered infarcts in bilateral hemispheres, likely embolic.  However, this does not explain his mental status.Ammonia 28, TSH 2.1242  Vitals:   08/31/22 0600 08/31/22 0800 08/31/22 0801 08/31/22 0815  BP: (!) 152/68 (!) 163/68  131/62  Pulse: 67 68 66 66  Resp: '14 14 19 15  '$ Temp: 98.1 F (36.7 C) 97.9 F (36.6 C) 97.9 F (36.6 C) 98.1 F (36.7 C)  TempSrc:      SpO2: 100% 99% 100% 100%  Weight:      Height:       CBC:  Recent Labs  Lab 08/24/22 2330 08/25/22 0151 08/29/22 0500 08/30/22 0500  WBC 9.3   < > 11.7* 12.6*  NEUTROABS 6.3  --   --   --   HGB 9.2*  10.5*   < > 8.1* 8.3*  HCT 29.5*  31.0*   < > 25.3* 26.1*  MCV 66.1*   < > 71.9* 72.5*  PLT 280   < > 174 194   < > = values in this interval not displayed.    Basic Metabolic Panel:  Recent Labs  Lab 08/27/22 0142 08/27/22 0545 08/27/22 1327 08/28/22 0549 08/28/22 0957 08/30/22 0500 08/30/22 1437 08/30/22 2302 08/31/22 0749  NA 150* 148*   < > 150*   < > 148* 145 143  144 142  K  --  5.5*  --  6.6*   < > 6.4* 5.2*  --   --   CL  --  116*  --  119*   < > 115* 103  --   --   CO2  --  24  --  21*   < > 19* 26  --   --   GLUCOSE  --  254*  --  222*   < > 271* 184*  --   --   BUN  --  29*  --  59*   < > 104* 55*  --   --   CREATININE  --  4.80*  --  7.07*   < > 8.50* 4.74*  --   --   CALCIUM  --  7.8*  --  8.5*   < > 8.7* 8.9  --   --   MG 2.1  --   --  2.5*  --   --   --   --   --   PHOS  --  3.5  --  4.6  --   --   --   --   --    < > = values in this interval not displayed.    Lipid Panel:  Recent Labs  Lab 08/26/22 0410 08/29/22 0450  CHOL 186  --   TRIG 372*  355* 101  HDL 21*  --   CHOLHDL 8.9  --   VLDL 74*  --   LDLCALC 91  --     HgbA1c:  Recent Labs  Lab 08/25/22 0231   HGBA1C 5.9*    Urine Drug Screen: No results for input(s): "LABOPIA", "COCAINSCRNUR", "LABBENZ", "AMPHETMU", "THCU", "LABBARB" in the last 168 hours.  Alcohol Level  Recent Labs  Lab 08/24/22 2330  ETH <10     IMAGING past 24 hours No results found.  PHYSICAL EXAM  Temp:  [  97.9 F (36.6 C)-99.7 F (37.6 C)] 98.1 F (36.7 C) (11/21 0815) Pulse Rate:  [59-76] 66 (11/21 0815) Resp:  [13-28] 15 (11/21 0815) BP: (122-207)/(62-97) 131/62 (11/21 0815) SpO2:  [95 %-100 %] 100 % (11/21 0815) Arterial Line BP: (114-243)/(50-239) 136/50 (11/21 0815) FiO2 (%):  [40 %] 40 % (11/21 0137) Weight:  [80.9 kg] 80.9 kg (11/20 0936)  General - Well nourished, well developed, intubated on sedation with fentanyl only  Ophthalmologic - fundi not visualized due to noncooperation.  Cardiovascular - Regular rate and rhythm.  Neuro - intubated on sedation, eyes closed, not following commands. With forced eye opening, both eye in mid position, not blinking to visual threat, not tracking, PERRL. Cough present. Breathing over the vent.  Facial symmetry not able to test due to ET tube.  Tongue protrusion not cooperative. No movement of extremities to noxious stimuli.  Sensation, coordination and gait not tested.     ASSESSMENT/PLAN [name] is a 51 y.o. male with history of end-stage renal disease on intermittent hemodialysis, diabetes, hypertension presented with  witnessed onset slurred speech, with headache at 10:30 PM for which EMS was activated.  On their arrival they report blood pressure was 260/140, glucose 126.  He was found to have a right sided cerebellar ICH.  Neurosurgery was consulted for possible EVD placement, but they have no plans for operative intervention at this point.  ICH remains stable on CT scan.  Patient has been receiving IHD.  MRI today shows scattered small strokes in bilateral hemispheres, likely embolic.  ICH: Acute right cerebellar small ICH, likely due to  chronic uncontrolled hypertension  Code Stroke CT head Acute intraparenchymal hematoma centered in the right cerebellar hemisphere with severe mass effect on the fourth ventricle and cerebral aqueduct. No hydrocephalus. CT repeat - hemorrhage size stable from earlier today. There is likely extension into the fourth ventricle but no intraventricular spread or hydrocephalus. CT head and neck premature atherosclerosis with notable vessel tortuosity suggesting chronic hypertension CT repeat 11/16 stable hematoma and no hydrocephalus CT Head 11/17 1. Small acute infarct at the right corpus callosum body and cingulate white matter, not seen on admission head CT. 2. Non progressed right cerebellar hematoma and swelling. No hydrocephalus. MRI brain no change in right cerebellar IPH, surrounding edema without hydrocephalus.  Multiple small infarcts in bilateral hemispheres, potentially embolic 2D Echo EF 50 to 55% EEG- This study showed generalized periodic discharges with triphasic morphology which is on the ictal-interictal continuum. However, the frequency, morphology and reactivity to stimulation is most likely indicative of toxic-metabolic causes. . Additionally there is moderate to severe diffuse encephalopathy, nonspecific etiology. No seizures were seen throughout the recording.  LDL 91 HgbA1c 5.9 VTE prophylaxis - SCD's No antithrombotic prior to admission, now on No antithrombotic.  Therapy recommendations:  pending Disposition:  pending  Cerebral Edema On 3% saline protocol -> off now due to fluid status Last NA 143->148->148 -> 144, with NA goal 150 Serial NA checks Q4h -> 23.4% PRN to keep Na goal around 150 11/18- 23.4% bolus ordered NSGY following - no surgical intervention at this time  Will allow Na to normalize, no further HTS boluses unless change in exam or worsening of cerebral edema on CT  ESRD on HD HD MWF Renal Dr. Jonnie Ross following, appreciate help Had HD catheter CRRT  stopped 11/17 due to severe anemia -> transition to HD  Acute hypoxic Respiratory failure  Vent management per CCM  On Fentanyl for sedation  Versed as  needed for agitation on vent Not candidate for extubation  Hypertension Home meds:  norvasc 10 mg, coreg 25 mg, imdur 30 mg, losartan 100 mg- on hold  UnStable Cleviprex still ongoing Resume home norvasc and coreg SBP goal < 160 BP goal normotensive  Hyperlipidemia Home meds:  none LDL 91 goal < 70 Consider statin at discharge  Diabetes type II Controlled Home meds:  Semglee HgbA1c 5.9, goal < 7.0 CBGs SSI  dysphagia  OG tube and cortrak placed [facility] day # 7   Patient seen and examined by NP/APP with MD. MD to update note as needed.   Murray , MSN, AGACNP-BC [facility] See Amion for schedule and pager information 08/31/2022 9:17 AM   ATTENDING ATTESTATION:   34 with end-stage renal disease now on hemodialysis.  Has a right cerebellar intracranial hemorrhage.  CCM to continue to wean sedation and vent weaning.  Sodium is trending down gradually.  No dialysis today with plan for tomorrow.  Continues to require fentanyl pushes overnight for coughing episodes and bucking the vent.  I believe this is likely contributing to his decreased mental status.  Later in the day he appeared to have more movement in both upper and lower extremities spontaneously off sedation.  We will continue to try to hold sedation much as possible.  We will check labs for altered mental status such as ammonia and TSH however this was unremarkable.  We will continue to monitor.  His brother who is in the room was updated and all his questions answered.   Appreciate CCM and nephrology assistance.     Dr. Reeves Forth evaluated pt independently, reviewed imaging, chart, labs. Discussed and formulated plan with the Resident/APP. Changes were made to the note where appropriate. Please see APP/resident note above  for details.     This patient is critically ill due to cerebellar ICH, cerebral edema, respiratory failure, ESRD, hypertensive emergency and at significant risk of neurological worsening, death form hematoma expansion, brain herniation, obstructive hydrocephalus, seizure. This patient's care requires constant monitoring of vital signs, hemodynamics, respiratory and cardiac monitoring, review of multiple databases, neurological assessment, discussion with family, other specialists and medical decision making of high complexity. I spent 35 minutes of neurocritical care time in the care of this patient.    Randy Ayala,MD   To contact Stroke Continuity provider, please refer to [email or web address] After hours, contact General Neurology

## 2022-08-31 NOTE — Progress Notes (Signed)
RT NOTE: patient placed on CPAP/PSV of 5/5 at 0820.  Currently tolerating well.  Will continue to monitor.

## 2022-08-31 NOTE — Progress Notes (Signed)
[facility] Progress Note   Subjective:   Seen in room, but RN readying him to go to MRI. Opening eyes slightly, remains intubated on vent. For HD tomorrow. Na normalizing - 142 today. Had spoke to dietician yesterday about changing him to different TF formulation with lower K content - changed to Nepro.  Objective Vitals:   08/31/22 1115 08/31/22 1130 08/31/22 1145 08/31/22 1200  BP: (!) 148/61 121/71 (!) 143/61 (!) 150/67  Pulse: 69 70 68 69  Resp: '15 15 13 17  '$ Temp: 98.2 F (36.8 C) 98.4 F (36.9 C) 98.2 F (36.8 C) 98.2 F (36.8 C)  TempSrc:      SpO2: 100% 100% 100% 100%  Weight:      Height:       Physical Exam General: Opening eyes slightly. Orally intubated, NG tube in place Heart: RRR; no murmur Lungs: CTA anteriorly, vent-assisted Abdomen: soft Extremities: No LE edema Dialysis Access: L AVF + thrill  Additional Objective Labs: Basic Metabolic Panel: Recent Labs  Lab 08/27/22 0545 08/27/22 1327 08/28/22 0549 08/28/22 0957 08/30/22 0500 08/30/22 1437 08/30/22 2302 08/31/22 0749 08/31/22 0842 08/31/22 1009  NA 148*   < > 150*   < > 148* 145 143  144 142 142  --   K 5.5*  --  6.6*   < > 6.4* 5.2*  --   --  5.6*  --   CL 116*  --  119*   < > 115* 103  --   --  102  --   CO2 24  --  21*   < > 19* 26  --   --  25  --   GLUCOSE 254*  --  222*   < > 271* 184*  --   --  211*  --   BUN 29*  --  59*   < > 104* 55*  --   --  94*  --   CREATININE 4.80*  --  7.07*   < > 8.50* 4.74*  --   --  6.64*  --   CALCIUM 7.8*  --  8.5*   < > 8.7* 8.9  --   --  8.9  --   PHOS 3.5  --  4.6  --   --   --   --   --   --  6.2*   < > = values in this interval not displayed.   Liver Function Tests: Recent Labs  Lab 08/24/22 2330 08/25/22 1621 08/27/22 0545 08/28/22 0549 08/31/22 1009  AST 15  --   --  15 19  ALT 17  --   --  14 18  ALKPHOS 67  --   --  64 84  BILITOT 0.6  --   --  0.5 0.2*  PROT 7.0  --   --  6.2* 6.3*  ALBUMIN 3.4*   < > 2.1* 2.1*  1.9*   < > = values in this interval not displayed.   CBC: Recent Labs  Lab 08/24/22 2330 08/25/22 0151 08/27/22 1951 08/28/22 0549 08/29/22 0500 08/30/22 0500 08/31/22 0842  WBC 9.3   < > 15.5* 15.5* 11.7* 12.6* 9.8  NEUTROABS 6.3  --   --   --   --   --   --   HGB 9.2*  10.5*   < > 8.4* 8.8* 8.1* 8.3* 7.7*  HCT 29.5*  31.0*   < > 25.3* 27.1* 25.3* 26.1* 24.7*  MCV 66.1*   < >  72.1* 72.1* 71.9* 72.5* 72.9*  PLT 280   < > 157 171 174 194 214   < > = values in this interval not displayed.   Blood Culture    Component Value Date/Time   SDES TRACHEAL ASPIRATE 08/26/2022 1212   SPECREQUEST NONE 08/26/2022 1212   CULT  08/26/2022 1212    ABUNDANT STREPTOCOCCUS ANGINOSIS RARE KLEBSIELLA OXYTOCA ABUNDANT HAEMOPHILUS INFLUENZAE BETA LACTAMASE NEGATIVE Performed at [facility], [place] [address] [place], [place] [address]    REPTSTATUS 08/29/2022 FINAL 08/26/2022 1212   Iron Studies:  Recent Labs    08/29/22 1600  IRON 22*  TIBC 99*   Studies/Results: DG CHEST PORT 1 VIEW  Result Date: 08/31/2022 CLINICAL DATA:  Intubated patient.  Possible aspiration. EXAM: PORTABLE CHEST 1 VIEW COMPARISON:  Radiographs 08/27/2022 and 08/26/2022. FINDINGS: 0946 hours. The endotracheal tube, left IJ central venous catheter and feeding to appear unchanged in position. The tip of the latter is not visualized. The heart size and mediastinal contours are stable. Diffuse interstitial opacities throughout both lungs have partially cleared in the interval. There is no confluent airspace opacity, pneumothorax or significant pleural effusion. The bones appear unchanged.  Telemetry leads overlie the chest. IMPRESSION: 1. Interval partial clearing of diffuse bilateral interstitial opacities, likely due to resolving edema. No new findings. 2. Stable support system. Electronically Signed   By: Richardean Sale M.D.   On: 08/31/2022 10:01    Medications:  sodium chloride     sodium chloride Stopped  (08/30/22 0033)   cefTRIAXone (ROCEPHIN)  IV Stopped (08/30/22 2004)   feeding supplement (NEPRO CARB STEADY) 45 mL/hr at 08/31/22 1200   fentaNYL infusion INTRAVENOUS 75 mcg/hr (08/31/22 1200)   thiamine (VITAMIN B1) injection      amLODipine  5 mg Per Tube BID   carvedilol  25 mg Per Tube BID WC   Chlorhexidine Gluconate Cloth  6 each Topical Daily   Chlorhexidine Gluconate Cloth  6 each Topical Q0600   Chlorhexidine Gluconate Cloth  6 each Topical Q0600   darbepoetin (ARANESP) injection - DIALYSIS  150 mcg Subcutaneous Q Sun-1800   docusate  100 mg Per Tube BID   feeding supplement (PROSource TF20)  60 mL Per Tube TID   insulin aspart  0-15 Units Subcutaneous Q4H   insulin aspart  7 Units Subcutaneous Q4H   losartan  100 mg Per Tube Daily   multivitamin  1 tablet Per Tube QHS   mouth rinse  15 mL Mouth Rinse Q2H   pantoprazole (PROTONIX) IV  40 mg Intravenous Daily   polyethylene glycol  17 g Per Tube Daily    Dialysis Orders: SW MWF 3h 6mn  80kg   400/800   2/2 bath  Hep 2000  LFA AVF - last HD 11/13, post 80.1kg - mircera 150 q2, last 11/13 - hectorol 486m IV q HD   Assessment/Plan: Cerebellar IC hemorrhage/cerebral edema. Uncontrolled HTN felt to be cause. Goal Na was 148-152 at first, now allowing to normalize per neuro note -  Na 142 today.  Opening eyes a bit - going for repeat MRI now. Uncont HTN - Off IV cleviprex now, getting coreg/amlodipine/losartan per tube. BP better - closer to dry weight.  Hyperkalemia - recurrent issue, TF's changed to Nepro on 11/20. Will order Lokelma daily. ESRD: Usual MWF schedule, required CRRT 11/15-11/17/23. HD 11/20 d/t hyperkalemia - evaluating on daily basis, more than likely will not be on our holiday schedule this week - plan for next HD  Wed 11/22. Resp Failure: On Vent, per ICU teams. Anemia of ESRD: Hgb 7.7 today, continue Aranesp 131mg weekly while here. Secondary HPTH: Ca/Phos ok.  CCa and phos in range. Continue VDRA. Resume  binder when eating.  DM2 - per pmd  KVeneta Penton PA-C 08/31/2022, 12:52 PM  [facility]

## 2022-08-31 NOTE — Progress Notes (Signed)
[place] Progress Note Patient Name: Gabriela Giannelli DOB: 1971/03/11 MRN: [id number]   Date of Service  08/31/2022  HPI/Events of Note  Frequent loose stools and development of Stage 2 sacral wound. Nursing request for Flexiseal.   eICU Interventions  Plan: Place Flexiseal.     Intervention Category Major Interventions: Other:  Lysle Dingwall 08/31/2022, 10:47 PM

## 2022-08-31 NOTE — Progress Notes (Signed)
Patient transported from 4N26 to MRI and back to room 4N26 on ventilator without complications.

## 2022-09-01 DIAGNOSIS — I614 Nontraumatic intracerebral hemorrhage in cerebellum: Secondary | ICD-10-CM

## 2022-09-01 DIAGNOSIS — I639 Cerebral infarction, unspecified: Secondary | ICD-10-CM

## 2022-09-01 DIAGNOSIS — G9340 Encephalopathy, unspecified: Secondary | ICD-10-CM

## 2022-09-01 DIAGNOSIS — L8990 Pressure ulcer of unspecified site, unspecified stage: Secondary | ICD-10-CM | POA: Insufficient documentation

## 2022-09-01 LAB — BASIC METABOLIC PANEL
Anion gap: 20 — ABNORMAL HIGH (ref 5–15)
BUN: 139 mg/dL — ABNORMAL HIGH (ref 6–20)
CO2: 22 mmol/L (ref 22–32)
Calcium: 9.3 mg/dL (ref 8.9–10.3)
Chloride: 102 mmol/L (ref 98–111)
Creatinine, Ser: 8.61 mg/dL — ABNORMAL HIGH (ref 0.61–1.24)
GFR, Estimated: 7 mL/min — ABNORMAL LOW (ref >60)
Glucose, Bld: 134 mg/dL — ABNORMAL HIGH (ref 70–99)
Potassium: 5.4 mmol/L — ABNORMAL HIGH (ref 3.5–5.1)
Sodium: 144 mmol/L (ref 135–145)

## 2022-09-01 LAB — CBC
HCT: 26.2 % — ABNORMAL LOW (ref 39.0–52.0)
Hemoglobin: 8.5 g/dL — ABNORMAL LOW (ref 13.0–17.0)
MCH: 22.8 pg — ABNORMAL LOW (ref 26.0–34.0)
MCHC: 32.4 g/dL (ref 30.0–36.0)
MCV: 70.4 fL — ABNORMAL LOW (ref 80.0–100.0)
Platelets: 292 10*3/uL (ref 150–400)
RBC: 3.72 MIL/uL — ABNORMAL LOW (ref 4.22–5.81)
RDW: 24.7 % — ABNORMAL HIGH (ref 11.5–15.5)
WBC: 11.8 10*3/uL — ABNORMAL HIGH (ref 4.0–10.5)
nRBC: 1.3 % — ABNORMAL HIGH (ref 0.0–0.2)

## 2022-09-01 LAB — GLUCOSE, CAPILLARY
Glucose-Capillary: 109 mg/dL — ABNORMAL HIGH (ref 70–99)
Glucose-Capillary: 125 mg/dL — ABNORMAL HIGH (ref 70–99)
Glucose-Capillary: 158 mg/dL — ABNORMAL HIGH (ref 70–99)
Glucose-Capillary: 195 mg/dL — ABNORMAL HIGH (ref 70–99)
Glucose-Capillary: 206 mg/dL — ABNORMAL HIGH (ref 70–99)

## 2022-09-01 LAB — MAGNESIUM: Magnesium: 2.9 mg/dL — ABNORMAL HIGH (ref 1.7–2.4)

## 2022-09-01 LAB — OSMOLALITY: Osmolality: 337 mOsm/kg (ref 275–295)

## 2022-09-01 LAB — PHOSPHORUS: Phosphorus: 7.3 mg/dL — ABNORMAL HIGH (ref 2.5–4.6)

## 2022-09-01 MED ORDER — HYDRALAZINE HCL 50 MG PO TABS: 50.0000 mg | ORAL_TABLET | Freq: Three times a day (TID) | ORAL | Status: DC

## 2022-09-01 MED ORDER — LOSARTAN POTASSIUM 50 MG PO TABS: 50.0000 mg | ORAL_TABLET | Freq: Every day | ORAL | Status: DC

## 2022-09-01 MED ORDER — PENTAFLUOROPROP-TETRAFLUOROETH EX AERO
INHALATION_SPRAY | CUTANEOUS | Status: AC
  Filled 2022-09-01: qty 30

## 2022-09-01 MED ORDER — DOCUSATE SODIUM 50 MG/5ML PO LIQD: 100.0000 mg | Freq: Two times a day (BID) | ORAL | Status: DC | PRN

## 2022-09-01 MED ORDER — HEPARIN SODIUM (PORCINE) 1000 UNIT/ML IJ SOLN
INTRAMUSCULAR | Status: AC
  Filled 2022-09-01: qty 4

## 2022-09-01 MED ORDER — NEPRO/CARBSTEADY PO LIQD
1000.0000 mL | ORAL | Status: DC
  Administered 2022-09-01 – 2022-09-22 (×16): 1000 mL
  Filled 2022-09-01 (×18): qty 1000

## 2022-09-01 MED ORDER — JUVEN PO PACK
1.0000 | PACK | Freq: Two times a day (BID) | ORAL | Status: DC
  Administered 2022-09-01 – 2022-09-23 (×36): 1
  Filled 2022-09-01 (×37): qty 1

## 2022-09-01 MED ORDER — HYDRALAZINE HCL 50 MG PO TABS
50.0000 mg | ORAL_TABLET | Freq: Three times a day (TID) | ORAL | Status: DC
  Administered 2022-09-01 – 2022-09-08 (×20): 50 mg
  Filled 2022-09-01 (×21): qty 1

## 2022-09-01 MED ORDER — PROSOURCE TF20 ENFIT COMPATIBL EN LIQD
60.0000 mL | Freq: Two times a day (BID) | ENTERAL | Status: DC
  Administered 2022-09-01 – 2022-09-12 (×22): 60 mL
  Filled 2022-09-01 (×21): qty 60

## 2022-09-01 MED ORDER — POLYETHYLENE GLYCOL 3350 17 G PO PACK: 17.0000 g | PACK | Freq: Every day | ORAL | Status: DC | PRN

## 2022-09-01 NOTE — Progress Notes (Signed)
Pt receives out-pt HD at [facility] on MWF 10:10 chair time. Will assist as needed.   [name] Renal Navigator [phone]

## 2022-09-01 NOTE — Procedures (Signed)
I was present at this dialysis session. I have reviewed the session itself and made appropriate changes.   Using AVF.  Can remove Temp L IJ HD cath.  2K bath. 1.5L UF goal.  Tolerating tx well.  Filed Weights   08/30/22 0530 08/30/22 0936 09/01/22 0800  Weight: 83.6 kg 80.9 kg 86.1 kg    Recent Labs  Lab 09/01/22 0538  NA 144  K 5.4*  CL 102  CO2 22  GLUCOSE 134*  BUN 139*  CREATININE 8.61*  CALCIUM 9.3  PHOS 7.3*    Recent Labs  Lab 08/30/22 0500 08/31/22 0842 09/01/22 0538  WBC 12.6* 9.8 11.8*  HGB 8.3* 7.7* 8.5*  HCT 26.1* 24.7* 26.2*  MCV 72.5* 72.9* 70.4*  PLT 194 214 292    Scheduled Meds:  amLODipine  5 mg Per Tube BID   carvedilol  25 mg Per Tube BID WC   Chlorhexidine Gluconate Cloth  6 each Topical Daily   darbepoetin (ARANESP) injection - DIALYSIS  150 mcg Subcutaneous Q Sun-1800   feeding supplement (PROSource TF20)  60 mL Per Tube TID   heparin sodium (porcine)       hydrALAZINE  50 mg Per Tube Q8H   insulin aspart  0-15 Units Subcutaneous Q4H   multivitamin  1 tablet Per Tube QHS   mouth rinse  15 mL Mouth Rinse Q2H   pantoprazole (PROTONIX) IV  40 mg Intravenous Daily   pentafluoroprop-tetrafluoroeth       sodium zirconium cyclosilicate  10 g Per Tube Daily   Continuous Infusions:  sodium chloride Stopped (08/30/22 0033)   cefTRIAXone (ROCEPHIN)  IV Stopped (08/31/22 2025)   feeding supplement (NEPRO CARB STEADY) 45 mL/hr at 09/01/22 0800   fentaNYL infusion INTRAVENOUS 50 mcg/hr (09/01/22 0800)   thiamine (VITAMIN B1) injection Stopped (09/01/22 0252)   PRN Meds:.sodium chloride, acetaminophen (TYLENOL) oral liquid 160 mg/5 mL, bisacodyl, docusate, fentaNYL, heparin sodium (porcine), hydrALAZINE, labetalol, midazolam, mouth rinse, pentafluoroprop-tetrafluoroeth, polyethylene glycol   Pearson Grippe  MD 09/01/2022, 9:57 AM

## 2022-09-01 NOTE — Progress Notes (Signed)
Nutrition Follow-up  DOCUMENTATION CODES:   Not applicable  INTERVENTION:   Tube feeding via Cortrak tube:  Increase Nepro to 50 ml/h (1200 ml per day) Decrease Prosource TF20 60 ml to BID  Provides 2320 kcal, 137 gm protein, 876 ml free water daily  Renal MVI   -1 packet Juven BID, each packet provides 95 calories, 2.5 grams of protein (collagen), and 9.8 grams of carbohydrate (3 grams sugar); also contains 7 grams of L-arginine and L-glutamine, 300 mg vitamin C, 15 mg vitamin E, 1.2 mcg vitamin B-12, 9.5 mg zinc, 200 mg calcium, and 1.5 g  Calcium Beta-hydroxy-Beta-methylbutyrate to support wound healing   NUTRITION DIAGNOSIS:   Inadequate oral intake related to inability to eat as evidenced by NPO status. Ongoing.   GOAL:   Patient will meet greater than or equal to 90% of their needs Met with TF at goal   MONITOR:   Vent status, Labs, Weight trends, TF tolerance, I & O's  REASON FOR ASSESSMENT:   Ventilator, Consult Enteral/tube feeding initiation and management  ASSESSMENT:   51 year old male who presented to the ED on 11/14 as a Code Stroke with AMS, L sided weakness, slurred speech, L sided gaze preference. PMH of ESRD on HD, DM, HTN. Pt required intubation in the ED. Pt admitted with ICH.  Pt discussed during ICU rounds and with RN.  Per MD/RN pt is not following commands. Pt remains on iHD. Per MD poor prognosis, ongoing [place] discussions but do not recommend trach   11/15 - s/p cortrak placement; tip at gastric antrum or duodenal bulb  11/15 - 11/17: CRRT   Medications reviewed and include: aranesp, SSI, rena-vit, protonix, lokelma  Fentanyl  Thiamine 500 mg IV 11/21 - 11/24  Labs reviewed: K 5.4, PO4 7.3 CBG's: 109-215  UF: 1500 ml   Diet Order:   Diet Order             Diet NPO time specified  Diet effective now                   EDUCATION NEEDS:   No education needs have been identified at this time  Skin:  Skin Assessment: Skin  Integrity Issues: Skin Integrity Issues:: Stage II Stage II: sacrum  Last BM:  300 ml via rectal tube  Height:   Ht Readings from Last 1 Encounters:  08/25/22 _0  (1.651 m)    Weight:   Wt Readings from Last 1 Encounters:  09/01/22 84.6 kg    Ideal Body Weight:  61.8 kg  BMI:  Body mass index is 31.04 kg/m.  Estimated Nutritional Needs:   Kcal:  2100-2300  Protein:  130-150 grams  Fluid:  1000 ml + UOP  [name], RD, LDN, CNSC See AMiON for contact information

## 2022-09-01 NOTE — Progress Notes (Signed)
RT NOTE: patient placed on CPAP/PSV of 10/5 at 0800.  Currently tolerating well.  Will continue to monitor and wean as tolerated.

## 2022-09-01 NOTE — Progress Notes (Signed)
NAMEJaymere Ross, MRN:  [id number], DOB:  08-13-1971, LOS: 8 ADMISSION DATE:  08/24/2022, CONSULTATION DATE:  08/25/2022 REFERRING MD:  [place] - Neuro CHIEF COMPLAINT:  AMS   History of Present Illness:  51 year old man who presented to [facility] ED 11/14 with left sided visual preference, left sided weakness, slurred speech. He was also profoundly hypertensive with SBP over 200s (highest documented is SBP 230). PMHx significant for HTN, T2DM, ESRD on HD (MWF).  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 He was intubated in the ED and neurosurgery was consulted for consideration of EVD.  He was admitted by neurology and PCCM was called in consultation for vent management.  Pertinent Medical History:   Past Medical History:  Diagnosis Date   Anemia    CKD (chronic kidney disease)    on wed 12/09/21   Diabetes mellitus without complication ([place])    type 2   Hypertension    Renal disorder    Significant Hospital Events: Including procedures, antibiotic start and stop dates in addition to other pertinent events   11/15 - Admitted overnight. 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  CTA Head/Neck without e/o vascular lesion at R cerebellar hemorrhage. HTS 3% initiated. Trialysis catheter placed L IJ for CRRT/HTS 23% (per Stroke team). CRRT initiated. 11/16 Low grade temp with Tmax 99.4, WBC uptrending 25.9 (9.0). CXR with increased RLL opacities. Cefepime initiated. Resp Cx/TA > mult spec h.flu, strep species, klebsiella species. TG elevated, weaning prop. 11/17 Ongoing low grade temps Tmax 100.24F. WBC downtrending 19.3 (25.9). CRRT filter clotting causing blood loss, Hgb 6.3. CRRT stopped, 1U PRBCs transfused. HD to resume. Repeat CT Head with small acute infarct at R corpus  callosum body/cingulate white matter, stable R cerebellar hematoma. CXR improved on cefepime. 11/19 stirs to touch  Interim History / Subjective:  Continues to have intermittent coughing spells that leads to agitation. Has required Fentanyl infusion at low dose for comfort.  Objective:  Blood pressure (!) 163/73, pulse 72, temperature 98.6 F (37 C), temperature source Axillary, resp. rate 13, height '5\' 5"'$  (1.651 m), weight 86.1 kg, SpO2 100 %.    Vent Mode: PSV;CPAP FiO2 (%):  [40 %] 40 % Set Rate:  [16 bmp] 16 bmp Vt Set:  [490 mL] 490 mL PEEP:  [5 cmH20] 5 cmH20 Pressure Support:  [8 cmH20-10 cmH20] 10 cmH20 Plateau Pressure:  [16 cmH20] 16 cmH20   Intake/Output Summary (Last 24 hours) at 09/01/2022 0908 Last data filed at 09/01/2022 0800 Gross per 24 hour  Intake 1281.47 ml  Output 300 ml  Net 981.47 ml    Filed Weights   08/30/22 0530 08/30/22 0936 09/01/22 0800  Weight: 83.6 kg 80.9 kg 86.1 kg   Physical Examination: General:  middle aged male in NAD HENT: [place]/AT, PERRL, no JVD, ETT in place PULM: Normal effort, CTAB CV: RRR, no mgr GI: Soft, NT, ND MSK: Normal bulk and tone Neuro: Sedated on vent. Occasionally gags on tube.    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 CTA head 11/14 > no e/o vascular lesion at R cerebellar hemorrhage MRI brain 11/21 > no significant changes, many small acute  infarcts  Assessment & Plan:   Acute IPH with severe mass effect/brain swelling - presumed hypertensive in nature Repeat CT Head with small acute infarct at R corpus callosum body/cingulate white matter, stable R cerebellar hematoma. Hypertensive emergency - hx of HTN, unsure of compliance with med regimen Neurology thinking encephalopathy is out of proportion to [place] - MRI brain 11/21  - Stroke service primary - AC/VTE ppx  per primary - Frequent neuro checks.  - Home Carvedilol, amlodipine. - D/c losartan 11/22 given hyperkalemia. - Add scheduled Hyralazine. - PRN hydralazine, labetalol.  - SBP goal < 159mHg - Continue high dose thiamine for now but low threshold to d/c as suspect his encephalopathy is due to [place] multiple CVAs - Continue ongoing goals of care, recommend DNR and no tracheostomy given poor prognosis   Acute hypoxic respiratory failure with inability to protect the airway - s/p intubation in ED PNA RLL due to klebsiella, strep, h.flu - VAP prevention - Ceftriaxone - SBT as able - Encephalopathy remains barrier to extubation - SBT during the day as tolerated. Rest on full support.  - Continue low dose Fentanyl infusion for comfort and to abate coughing spells  Hx ESRD on HD MWF Hyperkalemia 11/18 - Per nephrology - HD RN using temp cath for therapy. Hopefully we can remove.  - Trend chemistry  Anemia 2/2 CKD and acute blood loss associated with CRRT, now on HD - Trend CBC - Transfuse per ICU guidelines.   DM2 with hyperglycemia - CBG monitoring and SSI  At risk for malnutrition - TF    Best practice (evaluated daily):  Diet/type: tubefeeds DVT prophylaxis: not indicated GI prophylaxis: PPI Lines: N/A Foley:  N/A Code Status:  full code Last date of multidisciplinary goals of care discussion: Per primary.  Critical care time: 40 minutes    RMontey Hora PUtah- C [place] [facility] For pager details, please see AMION or use Epic chat  After 1900, please call [facility]West[facility] LLCfor cross coverage needs 09/01/2022, 9:35 AM

## 2022-09-01 NOTE — Progress Notes (Signed)
   09/01/22 1131  Vitals  Temp 98.1 F (36.7 C)  Temp Source Axillary  BP (!) 174/74  MAP (mmHg) 99  BP Location Left Leg  BP Method Automatic  Patient Position (if appropriate) Lying  Pulse Rate 74  Pulse Rate Source Monitor  ECG Heart Rate 75  Resp 16  Oxygen Therapy  SpO2 100 %  O2 Device Ventilator  Patient Activity (if Appropriate) In bed  Pulse Oximetry Type Continuous  During Treatment Monitoring  Blood Flow Rate (mL/min) 200 mL/min  HD Safety Checks Performed Yes  Intra-Hemodialysis Comments Tx completed  Post Treatment  Dialyzer Clearance Lightly streaked  Duration of HD Treatment -hour(s) 3 hour(s)  Hemodialysis Intake (mL) 0 mL  Liters Processed 72  Fluid Removed (mL) 1500 mL  Tolerated HD Treatment Yes  Post-Hemodialysis Comments hd tx completed. no complications.  AVG/AVF Arterial Site Held (minutes) 10 minutes  AVG/AVF Venous Site Held (minutes) 10 minutes  Fistula / Graft Left Forearm Arteriovenous fistula  Placement Date/Time: 12/10/21 1048   Placed prior to admission: No  Orientation: Left  Access Location: (c) Forearm  Access Type: Arteriovenous fistula  Site Condition No complications  Fistula / Graft Assessment Present;Thrill;Bruit  Status Deaccessed  Needle Size 15  Drainage Description None

## 2022-09-01 NOTE — Progress Notes (Addendum)
STROKE TEAM PROGRESS NOTE   INTERVAL HISTORY Patient is seen in his room with no family at the bedside. Discussed sedation with CCM, patient has had to remain on sedation due to inability to tolerate ETT. CCM is recommending no trach due to poor prognosis, will have to discuss with family, will consider a palliative care consult.    Vitals:   09/01/22 0801 09/01/22 0815 09/01/22 0830 09/01/22 0845  BP:  (!) 163/65 (!) 167/70 (!) 167/71  Pulse: 78 72 72 73  Resp: (!) '24 13 14 15  '$ Temp:  98.6 F (37 C)    TempSrc:  Axillary    SpO2: 100% 100% 100% 100%  Weight:      Height:       CBC:  Recent Labs  Lab 08/31/22 0842 09/01/22 0538  WBC 9.8 11.8*  HGB 7.7* 8.5*  HCT 24.7* 26.2*  MCV 72.9* 70.4*  PLT 214 829    Basic Metabolic Panel:  Recent Labs  Lab 08/31/22 0842 08/31/22 1009 08/31/22 1354 08/31/22 1958 09/01/22 0538  NA 142  --    < > 143 144  K 5.6*  --   --   --  5.4*  CL 102  --   --   --  102  CO2 25  --   --   --  22  GLUCOSE 211*  --   --   --  134*  BUN 94*  --   --   --  139*  CREATININE 6.64*  --   --   --  8.61*  CALCIUM 8.9  --   --   --  9.3  MG  --  2.6*  --   --  2.9*  PHOS  --  6.2*  --   --  7.3*   < > = values in this interval not displayed.    Lipid Panel:  Recent Labs  Lab 08/26/22 0410 08/29/22 0450  CHOL 186  --   TRIG 372*  355* 101  HDL 21*  --   CHOLHDL 8.9  --   VLDL 74*  --   LDLCALC 91  --     HgbA1c:  No results for input(s): "HGBA1C" in the last 168 hours.  Urine Drug Screen: No results for input(s): "LABOPIA", "COCAINSCRNUR", "LABBENZ", "AMPHETMU", "THCU", "LABBARB" in the last 168 hours.  Alcohol Level  No results for input(s): "ETH" in the last 168 hours.   IMAGING past 24 hours MR BRAIN WO CONTRAST  Result Date: 08/31/2022 CLINICAL DATA:  Stroke, hemorrhagic EXAM: MRI HEAD WITHOUT CONTRAST TECHNIQUE: Multiplanar, multiecho pulse sequences of the brain and surrounding structures were obtained without  intravenous contrast. COMPARISON:  CT head August 29, 2022. FINDINGS: Brain: When accounting for differences in technique, no substantial change is acute/recent intraparenchymal hemorrhage within the right cerebellum. Surrounding edema in the face of the fourth ventricle without hydrocephalus. Many small acute infarcts throughout bilateral frontal and parietal white matter and the corpus callosum, most confluent/large in the frontal white matter bilaterally. Mild associated edema without significant mass effect. No midline shift. No mass lesion. Evidence of prior hemorrhage in the right basal ganglia with additional scattered punctate foci of susceptibility artifact, compatible with prior microhemorrhages. Vascular: Major arterial flow voids are maintained at the skull base. Skull and upper cervical spine: Normal marrow signal. Sinuses/Orbits: Extensive paranasal sinus disease. No acute orbital findings. Other: Bilateral mastoid effusions. IMPRESSION: 1. When accounting for differences in technique, no substantial change is acute/recent intraparenchymal hemorrhage  within the right cerebellum. Surrounding edema in the face of the fourth ventricle without hydrocephalus. 2. Many small acute infarcts throughout bilateral frontal and parietal white matter and the corpus callosum, potentially embolic. 3. Evidence of prior basal ganglia hemorrhage and scattered chronic microhemorrhages, possibly hypertensive in etiology. Electronically Signed   By: Margaretha Sheffield M.D.   On: 08/31/2022 13:27   DG CHEST PORT 1 VIEW  Result Date: 08/31/2022 CLINICAL DATA:  Intubated patient.  Possible aspiration. EXAM: PORTABLE CHEST 1 VIEW COMPARISON:  Radiographs 08/27/2022 and 08/26/2022. FINDINGS: 0946 hours. The endotracheal tube, left IJ central venous catheter and feeding to appear unchanged in position. The tip of the latter is not visualized. The heart size and mediastinal contours are stable. Diffuse interstitial opacities  throughout both lungs have partially cleared in the interval. There is no confluent airspace opacity, pneumothorax or significant pleural effusion. The bones appear unchanged.  Telemetry leads overlie the chest. IMPRESSION: 1. Interval partial clearing of diffuse bilateral interstitial opacities, likely due to resolving edema. No new findings. 2. Stable support system. Electronically Signed   By: Richardean Sale M.D.   On: 08/31/2022 10:01    PHYSICAL EXAM  Temp:  [97.7 F (36.5 C)-99.5 F (37.5 C)] 98.6 F (37 C) (11/22 0815) Pulse Rate:  [67-83] 73 (11/22 0845) Resp:  [0-24] 15 (11/22 0845) BP: (121-195)/(60-81) 167/71 (11/22 0845) SpO2:  [99 %-100 %] 100 % (11/22 0845) Arterial Line BP: (117-181)/(47-75) 136/73 (11/21 1430) FiO2 (%):  [40 %] 40 % (11/22 0801) Weight:  [86.1 kg] 86.1 kg (11/22 0800)  General - Well nourished, well developed, intubated on sedation with fentanyl only  Ophthalmologic - fundi not visualized due to noncooperation.  Cardiovascular - Regular rate and rhythm.  Neuro - intubated on sedation, eyes closed, not following commands. With forced eye opening, both eye in mid position, not blinking to visual threat, not tracking, PERRL. Cough present. Breathing over the vent.  Facial symmetry not able to test due to ET tube.  Tongue protrusion not cooperative. No movement of extremities to noxious stimuli.  Sensation, coordination and gait not tested.     ASSESSMENT/PLAN [name] is a 51 y.o. male with history of end-stage renal disease on intermittent hemodialysis, diabetes, hypertension presented with  witnessed onset slurred speech, with headache at 10:30 PM for which EMS was activated.  On their arrival they report blood pressure was 260/140, glucose 126.  He was found to have a right sided cerebellar ICH.  Neurosurgery was consulted for possible EVD placement, but they have no plans for operative intervention at this point.  ICH remains stable on CT  scan.  Patient has been receiving IHD.  MRI today shows scattered small strokes in bilateral hemispheres, likely embolic.  ICH: Acute right cerebellar small ICH, likely due to chronic uncontrolled hypertension  Code Stroke CT head Acute intraparenchymal hematoma centered in the right cerebellar hemisphere with severe mass effect on the fourth ventricle and cerebral aqueduct. No hydrocephalus. CT repeat - hemorrhage size stable from earlier today. There is likely extension into the fourth ventricle but no intraventricular spread or hydrocephalus. CT head and neck premature atherosclerosis with notable vessel tortuosity suggesting chronic hypertension CT repeat 11/16 stable hematoma and no hydrocephalus CT Head 11/17 1. Small acute infarct at the right corpus callosum body and cingulate white matter, not seen on admission head CT. 2. Non progressed right cerebellar hematoma and swelling. No hydrocephalus. MRI brain no change in right cerebellar IPH, surrounding edema without hydrocephalus.  Multiple small infarcts in bilateral hemispheres, potentially embolic 2D Echo EF 50 to 55% EEG- This study showed generalized periodic discharges with triphasic morphology which is on the ictal-interictal continuum. However, the frequency, morphology and reactivity to stimulation is most likely indicative of toxic-metabolic causes. . Additionally there is moderate to severe diffuse encephalopathy, nonspecific etiology. No seizures were seen throughout the recording.  LDL 91 HgbA1c 5.9 VTE prophylaxis - SCD's No antithrombotic prior to admission, now on No antithrombotic.  Therapy recommendations:  pending Disposition:  pending  Cerebral Edema On 3% saline protocol -> off now due to fluid status Last NA 143->148->148 -> 144, with NA goal 150 Serial NA checks Q4h -> 23.4% PRN to keep Na goal around 150 11/18- 23.4% bolus ordered NSGY following - no surgical intervention at this time  Will allow Na to  normalize, no further HTS boluses unless change in exam or worsening of cerebral edema on CT  ESRD on HD HD MWF Renal Dr. Jonnie Finner following, appreciate help Had HD catheter CRRT stopped 11/17 due to severe anemia -> transition to HD  Pneumonia: Klebseilla, strep and H.flu on per CCM on Ceftiaxone.  Acute hypoxic Respiratory failure  Vent management per CCM  On Fentanyl for sedation  Versed as needed for agitation on vent Not candidate for extubation  Hypertension Home meds:  norvasc 10 mg, coreg 25 mg, imdur 30 mg, losartan 100 mg- on hold  UnStable Cleviprex still ongoing Resume home norvasc and coreg SBP goal < 160 BP goal normotensive  Hyperlipidemia Home meds:  none LDL 91 goal < 70 Consider statin at discharge  Diabetes type II Controlled Home meds:  Semglee HgbA1c 5.9, goal < 7.0 CBGs SSI  dysphagia  OG tube and cortrak placed [facility] day # 8   Patient seen and examined by NP/APP with MD. MD to update note as needed.   Janine Ores, DNP, FNP-BC [facility] Pager: [phone]  ATTENDING ATTESTATION:   81 with end-stage renal disease now on hemodialysis.  Has a right cerebellar intracranial hemorrhage.  CCM to continue to wean sedation and vent weaning.  Requiring fentanyl as acute bucking the vent off sedation.  At this point we have not seen much improvement.  Exam is unchanged and not following commands.  Palliative care consult for further discussion with family tomorrow.  Continue tube feeds.  On ceftriaxone for for strep and Klebsiella pneumonia.   Appreciate CCM and nephrology assistance.   Dr. Reeves Forth evaluated pt independently, reviewed imaging, chart, labs. Discussed and formulated plan with the Resident/APP. Changes were made to the note where appropriate. Please see APP/resident note above for details.     This patient is critically ill due to cerebellar ICH, cerebral edema, respiratory failure, ESRD, hypertensive  emergency and at significant risk of neurological worsening, death form hematoma expansion, brain herniation, obstructive hydrocephalus, seizure. This patient's care requires constant monitoring of vital signs, hemodynamics, respiratory and cardiac monitoring, review of multiple databases, neurological assessment, discussion with family, other specialists and medical decision making of high complexity. I spent 35 minutes of neurocritical care time in the care of this patient.    Litzy Dicker,MD    To contact Stroke Continuity provider, please refer to [email or web address] After hours, contact General Neurology

## 2022-09-02 ENCOUNTER — Inpatient Hospital Stay (HOSPITAL_COMMUNITY): Payer: No Typology Code available for payment source

## 2022-09-02 DIAGNOSIS — Z7189 Other specified counseling: Secondary | ICD-10-CM

## 2022-09-02 DIAGNOSIS — J9600 Acute respiratory failure, unspecified whether with hypoxia or hypercapnia: Secondary | ICD-10-CM

## 2022-09-02 DIAGNOSIS — Z515 Encounter for palliative care: Secondary | ICD-10-CM

## 2022-09-02 LAB — BASIC METABOLIC PANEL
Anion gap: 15 (ref 5–15)
BUN: 119 mg/dL — ABNORMAL HIGH (ref 6–20)
CO2: 25 mmol/L (ref 22–32)
Calcium: 8.5 mg/dL — ABNORMAL LOW (ref 8.9–10.3)
Chloride: 97 mmol/L — ABNORMAL LOW (ref 98–111)
Creatinine, Ser: 7.17 mg/dL — ABNORMAL HIGH (ref 0.61–1.24)
GFR, Estimated: 9 mL/min — ABNORMAL LOW (ref >60)
Glucose, Bld: 194 mg/dL — ABNORMAL HIGH (ref 70–99)
Potassium: 4.7 mmol/L (ref 3.5–5.1)
Sodium: 137 mmol/L (ref 135–145)

## 2022-09-02 LAB — PHOSPHORUS: Phosphorus: 9.7 mg/dL — ABNORMAL HIGH (ref 2.5–4.6)

## 2022-09-02 LAB — CBC
HCT: 26.7 % — ABNORMAL LOW (ref 39.0–52.0)
Hemoglobin: 8.8 g/dL — ABNORMAL LOW (ref 13.0–17.0)
MCH: 23 pg — ABNORMAL LOW (ref 26.0–34.0)
MCHC: 33 g/dL (ref 30.0–36.0)
MCV: 69.9 fL — ABNORMAL LOW (ref 80.0–100.0)
Platelets: 353 10*3/uL (ref 150–400)
RBC: 3.82 MIL/uL — ABNORMAL LOW (ref 4.22–5.81)
RDW: 24.3 % — ABNORMAL HIGH (ref 11.5–15.5)
WBC: 13.1 10*3/uL — ABNORMAL HIGH (ref 4.0–10.5)
nRBC: 1 % — ABNORMAL HIGH (ref 0.0–0.2)

## 2022-09-02 LAB — GLUCOSE, CAPILLARY
Glucose-Capillary: 136 mg/dL — ABNORMAL HIGH (ref 70–99)
Glucose-Capillary: 151 mg/dL — ABNORMAL HIGH (ref 70–99)
Glucose-Capillary: 151 mg/dL — ABNORMAL HIGH (ref 70–99)
Glucose-Capillary: 169 mg/dL — ABNORMAL HIGH (ref 70–99)
Glucose-Capillary: 176 mg/dL — ABNORMAL HIGH (ref 70–99)
Glucose-Capillary: 194 mg/dL — ABNORMAL HIGH (ref 70–99)

## 2022-09-02 LAB — MAGNESIUM: Magnesium: 2.5 mg/dL — ABNORMAL HIGH (ref 1.7–2.4)

## 2022-09-02 MED ORDER — CHLORHEXIDINE GLUCONATE CLOTH 2 % EX PADS
6.0000 | MEDICATED_PAD | Freq: Every day | CUTANEOUS | Status: DC
  Administered 2022-09-03: 6 via TOPICAL

## 2022-09-02 NOTE — Consult Note (Signed)
Consultation Note Date: 09/02/2022   Patient Name: Randy Ross  DOB: November 22, 1970  MRN: [id number]  Age / Sex: 51 y.o., male  PCP: Medicine, [place] Referring Physician: Stroke, Md, MD  Reason for Consultation: Establishing goals of care  HPI/Patient Profile: 51 y.o. male  with past medical history of Anemia, ESRD on HD, diabetes, and hypertension admitted on 08/24/2022 with acute IPH with severe mass effect/brain swelling and hypertensive emergency.  Patient also with inability to protect airway due to mental status.  PMT consulted to discuss goals of care.  Clinical Assessment and Goals of Care: I have reviewed medical records including EPIC notes, labs and imaging, received report from RN, assessed the patient and then met with patient's younger brother, girlfriend, and mother to discuss diagnosis prognosis, GOC, EOL wishes, disposition and options.  I introduced Palliative Medicine as specialized medical care for people living with serious illness. It focuses on providing relief from the symptoms and stress of a serious illness. The goal is to improve quality of life for both the patient and the family.  Per family patient goes by "Randy Ross"  Family shares prior to this event patient was doing quite well.  They share he was going to dialysis regularly and sometimes felt tired after dialysis but otherwise he lived in normal life.  He could independently care for himself.  He had a great appetite.   We discussed patient's current illness and what it means in the larger context of patient's on-going co-morbidities.  Natural disease trajectory and expectations at EOL were discussed.  We discussed so far patient remains dependent on ventilator for airway protection.  We discussed concern about long-term outcomes.  We discussed concern about what patient's new baseline will be.  I attempted to elicit values and goals of  care important to the patient.    The difference between aggressive medical intervention and comfort care was considered in light of the patient's goals of care.   We discussed concern that patient may continue to need mechanical ventilation.  We discussed that he would require trach for long-term mechanical ventilation.  We also discussed potential need for care in a facility.  Family seems quite shocked by this.  They share patient would never want to live dependent on others and he would not want a feeding tube.  Discussed with family the importance of continued conversation with family and the medical providers regarding overall plan of care and treatment options, ensuring decisions are within the context of the patients values and GOCs.    We did briefly discuss CODE STATUS and family would like for patient to remain full code for now.  I offered to family another meeting tomorrow to include other providers and patient's care team and additional family members.  Family was grateful for this and meeting scheduled for 2 PM.  We will request [place] interpreter services as patient's mother would benefit from this.  Questions and concerns were addressed. The family was encouraged to call with questions or concerns.  Primary Decision Maker NEXT OF KIN -mother (goes by Randy Ross or Randy Ross)    SUMMARY OF RECOMMENDATIONS   Initial family discussion -family seems to indicate if patient will need trach/PEG/long-term care he would not want this and they would likely choose compassionate extubation however there is need for additional discussion to include other family members  Family meeting scheduled for 11/24 at 2 PM, CCM and neuro providers plan to attend, [place] interpreter requested as well  Code Status/Advance Care  Planning: Full code     Primary Diagnoses: Present on Admission:  Encephalopathy acute   I have reviewed the medical record, interviewed the patient and family, and examined the  patient. The following aspects are pertinent.  Past Medical History:  Diagnosis Date   Anemia    CKD (chronic kidney disease)    on wed 12/09/21   Diabetes mellitus without complication (HCC)    type 2   Hypertension    Renal disorder    Social History   Socioeconomic History   Marital status: Single    Spouse name: Not on file   Number of children: Not on file   Years of education: Not on file   Highest education level: Not on file  Occupational History   Not on file  Tobacco Use   Smoking status: Some Days    Packs/day: 0.15    Types: Cigarettes   Smokeless tobacco: Never  Vaping Use   Vaping Use: Never used  Substance and Sexual Activity   Alcohol use: Not Currently   Drug use: Not Currently   Sexual activity: Not Currently  Other Topics Concern   Not on file  Social History Narrative   ** Merged History Encounter **       Social Determinants of Health   Financial Resource Strain: Not on file  Food Insecurity: Not on file  Transportation Needs: Not on file  Physical Activity: Not on file  Stress: Not on file  Social Connections: Not on file   No family history on file. Scheduled Meds:  amLODipine  5 mg Per Tube BID   carvedilol  25 mg Per Tube BID WC   Chlorhexidine Gluconate Cloth  6 each Topical Daily   Chlorhexidine Gluconate Cloth  6 each Topical Q0600   darbepoetin (ARANESP) injection - DIALYSIS  150 mcg Subcutaneous Q Sun-1800   feeding supplement (PROSource TF20)  60 mL Per Tube BID   hydrALAZINE  50 mg Per Tube Q8H   insulin aspart  0-15 Units Subcutaneous Q4H   multivitamin  1 tablet Per Tube QHS   nutrition supplement (JUVEN)  1 packet Per Tube BID BM   mouth rinse  15 mL Mouth Rinse Q2H   pantoprazole (PROTONIX) IV  40 mg Intravenous Daily   sodium zirconium cyclosilicate  10 g Per Tube Daily   Continuous Infusions:  sodium chloride Stopped (08/30/22 0033)   feeding supplement (NEPRO CARB STEADY) 50 mL/hr at 09/02/22 1200   fentaNYL  infusion INTRAVENOUS 100 mcg/hr (09/02/22 1200)   thiamine (VITAMIN B1) injection Stopped (09/02/22 0931)   PRN Meds:.sodium chloride, acetaminophen (TYLENOL) oral liquid 160 mg/5 mL, bisacodyl, docusate, fentaNYL, hydrALAZINE, labetalol, midazolam, mouth rinse, polyethylene glycol Allergies  Allergen Reactions   Gabapentin Shortness Of Breath and Rash    Scratchy throat    Metformin And Related Other (See Comments)    Affecting kidney function    Review of Systems  Unable to perform ROS: Intubated    Physical Exam Constitutional:      Appearance: He is ill-appearing.     Comments: Nonpurposeful movements, unable to follow commands, appears agitated  Pulmonary:     Effort: Pulmonary effort is normal.  Skin:    General: Skin is warm and dry.     Vital Signs: BP 131/69   Pulse 69   Temp 98.1 F (36.7 C)   Resp 11   Ht _0  (1.651 m)   Wt 84.6 kg   SpO2 100%   BMI 31.04  kg/m  Pain Scale: CPOT   Pain Score: 0-No pain   SpO2: SpO2: 100 % O2 Device:SpO2: 100 % O2 Flow Rate: .   IO: Intake/output summary:  Intake/Output Summary (Last 24 hours) at 09/02/2022 1354 Last data filed at 09/02/2022 1200 Gross per 24 hour  Intake 2022.67 ml  Output 400 ml  Net 1622.67 ml    LBM: Last BM Date : 09/02/22 Baseline Weight: Weight: 80.4 kg Most recent weight: Weight: 84.6 kg      *Please note that this is a verbal dictation therefore any spelling or grammatical errors are due to the "[place] [employer]" system interpretation.  Juel Burrow, DNP, AGNP-C Palliative Medicine Team [phone] Pager: [phone]

## 2022-09-02 NOTE — Progress Notes (Signed)
NAMEAurthur Ross, MRN:  [id number], DOB:  1971/04/10, LOS: 9 ADMISSION DATE:  08/24/2022, CONSULTATION DATE:  08/25/2022 REFERRING MD:  [place] - Neuro CHIEF COMPLAINT:  AMS   History of Present Illness:  51 year old man who presented to [facility] ED 11/14 with left sided visual preference, left sided weakness, slurred speech. He was also profoundly hypertensive with SBP over 200s (highest documented is SBP 230). PMHx significant for HTN, T2DM, ESRD on HD (MWF).  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 He was intubated in the ED and neurosurgery was consulted for consideration of EVD.  He was admitted by neurology and PCCM was called in consultation for vent management.  Pertinent Medical History:   Past Medical History:  Diagnosis Date   Anemia    CKD (chronic kidney disease)    on wed 12/09/21   Diabetes mellitus without complication ([place])    type 2   Hypertension    Renal disorder    Significant Hospital Events: Including procedures, antibiotic start and stop dates in addition to other pertinent events   11/15 - Admitted overnight. 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  CTA Head/Neck without e/o vascular lesion at R cerebellar hemorrhage. HTS 3% initiated. Trialysis catheter placed L IJ for CRRT/HTS 23% (per Stroke team). CRRT initiated. 11/16 Low grade temp with Tmax 99.4, WBC uptrending 25.9 (9.0). CXR with increased RLL opacities. Cefepime initiated. Resp Cx/TA > mult spec h.flu, strep species, klebsiella species. TG elevated, weaning prop. 11/17 Ongoing low grade temps Tmax 100.40F. WBC downtrending 19.3 (25.9). CRRT filter clotting causing blood loss, Hgb 6.3. CRRT stopped, 1U PRBCs transfused. HD to resume. Repeat CT Head with small acute infarct at R corpus  callosum body/cingulate white matter, stable R cerebellar hematoma. CXR improved on cefepime. 11/19 stirs to touch  Interim History / Subjective:   Failing PSV weans  Objective:  Blood pressure (!) 144/69, pulse 66, temperature 98.7 F (37.1 C), temperature source Oral, resp. rate 16, height '5\' 5"'$  (1.651 m), weight 84.6 kg, SpO2 100 %.    Vent Mode: PRVC FiO2 (%):  [40 %] 40 % Set Rate:  [16 bmp] 16 bmp Vt Set:  [490 mL] 490 mL PEEP:  [5 cmH20] 5 cmH20 Pressure Support:  [10 cmH20] 10 cmH20 Plateau Pressure:  [15 cmH20] 15 cmH20   Intake/Output Summary (Last 24 hours) at 09/02/2022 0748 Last data filed at 09/02/2022 0700 Gross per 24 hour  Intake 1994.24 ml  Output 1900 ml  Net 94.24 ml   Filed Weights   08/30/22 0936 09/01/22 0800 09/01/22 1142  Weight: 80.9 kg 86.1 kg 84.6 kg   Physical Examination: Gen:      No acute distress HEENT:  EOMI, sclera anicteric Neck:     No masses; no thyromegaly Lungs:    Clear to auscultation bilaterally; normal respiratory effort CV:         Regular rate and rhythm; no murmurs Abd:      + bowel sounds; soft, non-tender; no palpable masses, no distension Ext:    No edema; adequate peripheral perfusion Skin:      Warm and dry; no rash Neuro: Opens eyes to stimuli, right hemiparesis.  Labs/imaging reviewed Significant for potassium 5.4, creatinine 8.61 WBC 11.8, hemoglobin 8.5, platelets 292  Labs/imaging personally reviewed:  CT head 11/4 > 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 CTA head 11/14 > no e/o vascular lesion at R cerebellar hemorrhage MRI brain 11/21 > no significant changes, many small acute infarcts  Assessment & Plan:   Acute IPH with severe mass effect/brain swelling - presumed hypertensive in nature Repeat CT Head with small acute infarct at R corpus callosum body/cingulate white matter, stable R cerebellar  hematoma. Hypertensive emergency - hx of HTN, unsure of compliance with med regimen Neurology thinking encephalopathy is out of proportion to [place] - MRI brain 11/21  - Stroke service primary - AC/VTE ppx per primary - Frequent neuro checks.  - Home Carvedilol, amlodipine. - D/c losartan 11/22 given hyperkalemia. - Add scheduled Hyralazine. - PRN hydralazine, labetalol.  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  Getting Lokelma for elevated potassium - Per nephrology - HD RN using temp cath for therapy. Hopefully we can remove.   Anemia 2/2 CKD and acute blood loss associated with CRRT, now on HD - Trend CBC - Transfuse per ICU guidelines.   DM2 with hyperglycemia - CBG monitoring and SSI  At risk for malnutrition - TF   Best practice (evaluated daily):  Diet/type: tubefeeds DVT prophylaxis: not indicated GI prophylaxis: PPI Lines: N/A Foley:  N/A Code Status:  full code Last date of multidisciplinary goals of care discussion: Per primary.  Critical care time:    The patient is critically ill with multiple organ system failure and requires high complexity decision making for assessment and support, frequent evaluation and titration of therapies, advanced monitoring, review of radiographic studies and interpretation of complex data.   Critical Care Time devoted to patient care services,  exclusive of separately billable procedures, described in this note is 35 minutes.   PMarshell GarfinkelMD [facility] See Amion for pager  If no response to pager , please call [phone] until 7pm After 7:00 pm call Elink  [phone]/23/2023, 8:11 AM

## 2022-09-02 NOTE — Progress Notes (Signed)
Pt placed on PSV 5/5 per wean protocol. Pt is tolerating well at this time. RT to continue to monitor.

## 2022-09-02 NOTE — Progress Notes (Signed)
Placed pt back on full vent support for CT trip.

## 2022-09-02 NOTE — Progress Notes (Addendum)
STROKE TEAM PROGRESS NOTE   INTERVAL HISTORY Patient is seen in his room with no family at the bedside. Continues to cough around ETT with lowered sedation but not following commands or tracking examiner. He is weaning 5/5 on the ventilator. Hypertensive, required 2 doses of PRN labetalol overnight.  Palliative care consulted as well  Vitals:   09/02/22 0621 09/02/22 0700 09/02/22 0800 09/02/22 0820  BP:  (!) 144/69 137/67   Pulse: 71 66 67 70  Resp: '17 16 16 17  '$ Temp:      TempSrc:      SpO2: 99% 100% 100% 100%  Weight:      Height:       CBC:  Recent Labs  Lab 08/31/22 0842 09/01/22 0538  WBC 9.8 11.8*  HGB 7.7* 8.5*  HCT 24.7* 26.2*  MCV 72.9* 70.4*  PLT 214 751    Basic Metabolic Panel:  Recent Labs  Lab 08/31/22 0842 08/31/22 1009 08/31/22 1354 08/31/22 1958 09/01/22 0538  NA 142  --    < > 143 144  K 5.6*  --   --   --  5.4*  CL 102  --   --   --  102  CO2 25  --   --   --  22  GLUCOSE 211*  --   --   --  134*  BUN 94*  --   --   --  139*  CREATININE 6.64*  --   --   --  8.61*  CALCIUM 8.9  --   --   --  9.3  MG  --  2.6*  --   --  2.9*  PHOS  --  6.2*  --   --  7.3*   < > = values in this interval not displayed.    Lipid Panel:  Recent Labs  Lab 08/29/22 0450  TRIG 101    HgbA1c:  No results for input(s): "HGBA1C" in the last 168 hours.  Urine Drug Screen: No results for input(s): "LABOPIA", "COCAINSCRNUR", "LABBENZ", "AMPHETMU", "THCU", "LABBARB" in the last 168 hours.  Alcohol Level  No results for input(s): "ETH" in the last 168 hours.   IMAGING past 24 hours No results found.  PHYSICAL EXAM  Temp:  [97.8 F (36.6 C)-99.4 F (37.4 C)] 98.7 F (37.1 C) (11/23 0400) Pulse Rate:  [66-77] 70 (11/23 0820) Resp:  [10-23] 17 (11/23 0820) BP: (102-197)/(45-101) 137/67 (11/23 0800) SpO2:  [97 %-100 %] 100 % (11/23 0820) FiO2 (%):  [40 %] 40 % (11/23 0820) Weight:  [84.6 kg] 84.6 kg (11/22 1142)  General - Well nourished, well  developed, intubated on sedation with fentanyl only  Ophthalmologic - fundi not visualized due to noncooperation.  Cardiovascular - Regular rate and rhythm.  Neuro - intubated on sedation, eyes closed, not following commands. Does resist forced eye opening. With forced eye opening, both eye in mid position, not blinking to visual threat, not tracking, PERRL. Cough present. Breathing over the vent.  Facial symmetry not able to test due to ET tube.  Tongue protrusion not cooperative.  Today he was able to localize with his left upper extremity to sternal rub.  No withdrawal in the left lower extremity to painful stimuli.  No movement in the right arm and leg to painful stimuli.   coordination and gait not tested.     ASSESSMENT/PLAN Randy Ross is a 51 y.o. male with history of end-stage renal disease on intermittent hemodialysis, diabetes, hypertension presented  with  witnessed onset slurred speech, with headache at 10:30 PM for which EMS was activated.  On their arrival they report blood pressure was 260/140, glucose 126.  He was found to have a right sided cerebellar ICH.  Neurosurgery was consulted for possible EVD placement, but they have no plans for operative intervention at this point.  ICH remains stable on CT scan.  Patient has been receiving IHD.  MRI today shows scattered small strokes in bilateral hemispheres, likely embolic.  ICH: Acute right cerebellar small ICH, likely due to chronic uncontrolled hypertension vs embolic Code Stroke CT head Acute intraparenchymal hematoma centered in the right cerebellar hemisphere with severe mass effect on the fourth ventricle and cerebral aqueduct. No hydrocephalus. CT repeat - hemorrhage size stable from earlier today. There is likely extension into the fourth ventricle but no intraventricular spread or hydrocephalus. CT head and neck premature atherosclerosis with notable vessel tortuosity suggesting chronic hypertension CT repeat 11/16  stable hematoma and no hydrocephalus CT Head 11/17 1. Small acute infarct at the right corpus callosum body and cingulate white matter, not seen on admission head CT. 2. Non progressed right cerebellar hematoma and swelling. No hydrocephalus. MRI brain no change in right cerebellar IPH, surrounding edema without hydrocephalus.  Multiple small infarcts in bilateral hemispheres, potentially embolic 2D Echo EF 50 to 55% EEG- This study showed generalized periodic discharges with triphasic morphology which is on the ictal-interictal continuum. However, the frequency, morphology and reactivity to stimulation is most likely indicative of toxic-metabolic causes. . Additionally there is moderate to severe diffuse encephalopathy, nonspecific etiology. No seizures were seen throughout the recording.  LDL 91 HgbA1c 5.9 VTE prophylaxis - SCD's No antithrombotic prior to admission, now on No antithrombotic.  Therapy recommendations:  pending Disposition:  pending  Cerebral Edema On 3% saline protocol -> off now due to fluid status Last NA 143->148->148 -> 144, with NA goal 150 Serial NA checks Q4h -> 23.4% PRN to keep Na goal around 150 11/18- 23.4% bolus ordered NSGY following - no surgical intervention at this time  Will allow Na to normalize, no further HTS boluses unless change in exam or worsening of cerebral edema on CT  ESRD on HD HD MWF Renal Dr. Jonnie Finner following, appreciate help Had HD catheter CRRT stopped 11/17 due to severe anemia -> transition to HD  Pneumonia: [place], strep and H.flu on per CCM on Ceftiaxone.  Acute hypoxic Respiratory failure  Vent management per CCM  On Fentanyl for sedation  Versed as needed for agitation on vent Not candidate for extubation  Hypertension Home meds:  norvasc 10 mg, coreg 25 mg, imdur 30 mg, losartan 100 mg- on hold  UnStable Cleviprex still ongoing Resume home norvasc and coreg SBP goal < 160 BP goal  normotensive  Hyperlipidemia Home meds:  none LDL 91 goal < 70 Consider statin at discharge  Diabetes type II Controlled Home meds:  Semglee HgbA1c 5.9, goal < 7.0 CBGs SSI  dysphagia  OG tube and cortrak placed [facility] day # 9   Patient seen and examined by NP/APP with MD. MD to update note as needed.   Janine Ores, DNP, FNP-BC [facility] Pager: [phone]  ATTENDING ATTESTATION:   55 with end-stage renal disease now on hemodialysis.  Has a right cerebellar intracranial hemorrhage with multiple embolic scattered strokes likely cardioembolic.  He has not shown significant improvement which could be from his scattered strokes however I think his uremic encephalopathy plays a large  role.  He is undergoing dialysis.  Today he was able to localize at this left upper extremity to sternal rub and withdrawing in the left lower extremity.  Still unresponsive not following commands.  Tends to keep his eyes forcefully closed pupils equal round reactive does not track the examiner.  On ceftriaxone for for strep and Klebsiella pneumonia.  Will get CT scan to evaluate cerebellar hemorrhage.  Palliative care has been consulted.  Family meeting is scheduled for tomorrow to discuss goals of care.   Appreciate CCM and nephrology assistance.   Dr. Reeves Forth evaluated pt independently, reviewed imaging, chart, labs. Discussed and formulated plan with the Resident/APP. Changes were made to the note where appropriate. Please see APP/resident note above for details.     This patient is critically ill due to cerebellar ICH, cerebral edema, respiratory failure, ESRD, hypertensive emergency and at significant risk of neurological worsening, death form hematoma expansion, brain herniation, obstructive hydrocephalus, seizure. This patient's care requires constant monitoring of vital signs, hemodynamics, respiratory and cardiac monitoring, review of multiple databases,  neurological assessment, discussion with family, other specialists and medical decision making of high complexity. I spent 35 minutes of neurocritical care time in the care of this patient.    Randy Bashore,MD    To contact Stroke Continuity provider, please refer to [email or web address] After hours, contact General Neurology

## 2022-09-02 NOTE — Progress Notes (Signed)
Pt transported to CT and back to 4N 26 on full vent support. No complications noted.

## 2022-09-02 NOTE — Progress Notes (Signed)
[facility] Progress Note   Subjective:   Pt seen in room, alert, somewhat restless. Temp L IJ cath was removed.   Objective Vitals:   09/02/22 0700 09/02/22 0800 09/02/22 0820 09/02/22 0900  BP: (!) 144/69 137/67  (!) 159/78  Pulse: 66 67 70 72  Resp: '16 16 17 13  '$ Temp:  98.4 F (36.9 C)    TempSrc:      SpO2: 100% 100% 100% 99%  Weight:      Height:       Physical Exam General: Alert male, orally intubated, NG tube. Alert but not tracking Heart: RRR, no murmurs, rubs or gallops Lungs: CTA anteriorly, + vent Abdomen: Soft, non-distended, +BS Extremities: No edema b/l lower extremities Dialysis Access:  LUE AVF + bruit  Additional Objective Labs: Basic Metabolic Panel: Recent Labs  Lab 08/31/22 0842 08/31/22 1009 08/31/22 1354 08/31/22 1958 09/01/22 0538 09/02/22 0903  NA 142  --    < > 143 144 137  K 5.6*  --   --   --  5.4* 4.7  CL 102  --   --   --  102 97*  CO2 25  --   --   --  22 25  GLUCOSE 211*  --   --   --  134* 194*  BUN 94*  --   --   --  139* 119*  CREATININE 6.64*  --   --   --  8.61* 7.17*  CALCIUM 8.9  --   --   --  9.3 8.5*  PHOS  --  6.2*  --   --  7.3* 9.7*   < > = values in this interval not displayed.   Liver Function Tests: Recent Labs  Lab 08/27/22 0545 08/28/22 0549 08/31/22 1009  AST  --  15 19  ALT  --  14 18  ALKPHOS  --  64 84  BILITOT  --  0.5 0.2*  PROT  --  6.2* 6.3*  ALBUMIN 2.1* 2.1* 1.9*   No results for input(s): "LIPASE", "AMYLASE" in the last 168 hours. CBC: Recent Labs  Lab 08/29/22 0500 08/30/22 0500 08/31/22 0842 09/01/22 0538 09/02/22 0903  WBC 11.7* 12.6* 9.8 11.8* 13.1*  HGB 8.1* 8.3* 7.7* 8.5* 8.8*  HCT 25.3* 26.1* 24.7* 26.2* 26.7*  MCV 71.9* 72.5* 72.9* 70.4* 69.9*  PLT 174 194 214 292 353   Blood Culture    Component Value Date/Time   SDES TRACHEAL ASPIRATE 08/26/2022 1212   SPECREQUEST NONE 08/26/2022 1212   CULT  08/26/2022 1212    ABUNDANT STREPTOCOCCUS ANGINOSIS RARE  KLEBSIELLA OXYTOCA ABUNDANT HAEMOPHILUS INFLUENZAE BETA LACTAMASE NEGATIVE Performed at [facility], [place] [address], [place], [place] [address]    REPTSTATUS 08/29/2022 FINAL 08/26/2022 1212    Cardiac Enzymes: No results for input(s): "CKTOTAL", "CKMB", "CKMBINDEX", "TROPONINI" in the last 168 hours. CBG: Recent Labs  Lab 09/01/22 1633 09/01/22 2107 09/02/22 0011 09/02/22 0425 09/02/22 0839  GLUCAP 195* 206* 136* 169* 194*   Iron Studies: No results for input(s): "IRON", "TIBC", "TRANSFERRIN", "FERRITIN" in the last 72 hours. '@lablastinr3'$ @ Studies/Results: MR BRAIN WO CONTRAST  Result Date: 08/31/2022 CLINICAL DATA:  Stroke, hemorrhagic EXAM: MRI HEAD WITHOUT CONTRAST TECHNIQUE: Multiplanar, multiecho pulse sequences of the brain and surrounding structures were obtained without intravenous contrast. COMPARISON:  CT head August 29, 2022. FINDINGS: Brain: When accounting for differences in technique, no substantial change is acute/recent intraparenchymal hemorrhage within the right cerebellum. Surrounding edema in the  face of the fourth ventricle without hydrocephalus. Many small acute infarcts throughout bilateral frontal and parietal white matter and the corpus callosum, most confluent/large in the frontal white matter bilaterally. Mild associated edema without significant mass effect. No midline shift. No mass lesion. Evidence of prior hemorrhage in the right basal ganglia with additional scattered punctate foci of susceptibility artifact, compatible with prior microhemorrhages. Vascular: Major arterial flow voids are maintained at the skull base. Skull and upper cervical spine: Normal marrow signal. Sinuses/Orbits: Extensive paranasal sinus disease. No acute orbital findings. Other: Bilateral mastoid effusions. IMPRESSION: 1. When accounting for differences in technique, no substantial change is acute/recent intraparenchymal hemorrhage within the right cerebellum. Surrounding  edema in the face of the fourth ventricle without hydrocephalus. 2. Many small acute infarcts throughout bilateral frontal and parietal white matter and the corpus callosum, potentially embolic. 3. Evidence of prior basal ganglia hemorrhage and scattered chronic microhemorrhages, possibly hypertensive in etiology. Electronically Signed   By: Margaretha Sheffield M.D.   On: 08/31/2022 13:27   Medications:  sodium chloride Stopped (08/30/22 0033)   feeding supplement (NEPRO CARB STEADY) 50 mL/hr at 09/02/22 0900   fentaNYL infusion INTRAVENOUS 50 mcg/hr (09/02/22 0900)   thiamine (VITAMIN B1) injection 500 mg (09/02/22 0901)    amLODipine  5 mg Per Tube BID   carvedilol  25 mg Per Tube BID WC   Chlorhexidine Gluconate Cloth  6 each Topical Daily   darbepoetin (ARANESP) injection - DIALYSIS  150 mcg Subcutaneous Q Sun-1800   feeding supplement (PROSource TF20)  60 mL Per Tube BID   hydrALAZINE  50 mg Per Tube Q8H   insulin aspart  0-15 Units Subcutaneous Q4H   multivitamin  1 tablet Per Tube QHS   nutrition supplement (JUVEN)  1 packet Per Tube BID BM   mouth rinse  15 mL Mouth Rinse Q2H   pantoprazole (PROTONIX) IV  40 mg Intravenous Daily   sodium zirconium cyclosilicate  10 g Per Tube Daily    Outpatient Dialysis Orders: SW MWF 3h 62mn  80kg   400/800   2/2 bath  Hep 2000  LFA AVF - last HD 11/13, post 80.1kg - mircera 150 q2, last 11/13 - hectorol 460m IV q HD    Assessment/Plan: Cerebellar IC hemorrhage/cerebral edema. Uncontrolled HTN felt to be cause. Goal Na was 148-152 at first, now allowing to normalize per neuro note -  Na 137 today.   Uncont HTN - On IV cleviprex, getting coreg/amlodipine per tube. closer to dry weight, will continue to titrate down volume as tolerated Hyperkalemia - recurrent issue, TF's changed to Nepro on 11/20. Will order Lokelma daily. ESRD: Usual MWF schedule, required CRRT 11/15-11/17/23. HD 11/20 d/t hyperkalemia. Next HD tomorrow, 11/24 Resp Failure:  On Vent, per ICU teams. 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 Secondary HPTH: CCa ok. Phos 9.7, tube feed formulation was just changed, will see if phos comes down. If not, will start binder via NG tube. Continue VDRA.  DM2 - per pmd   SamAnice PaganiniA-C 09/02/2022, 10:38 AM  [facility] Pager: [phone]

## 2022-09-03 ENCOUNTER — Encounter (HOSPITAL_COMMUNITY): Payer: Self-pay | Admitting: Neurology

## 2022-09-03 LAB — BASIC METABOLIC PANEL WITH GFR
Anion gap: 25 — ABNORMAL HIGH (ref 5–15)
BUN: 162 mg/dL — ABNORMAL HIGH (ref 6–20)
CO2: 23 mmol/L (ref 22–32)
Calcium: 9.8 mg/dL (ref 8.9–10.3)
Chloride: 92 mmol/L — ABNORMAL LOW (ref 98–111)
Creatinine, Ser: 8.86 mg/dL — ABNORMAL HIGH (ref 0.61–1.24)
GFR, Estimated: 7 mL/min — ABNORMAL LOW
Glucose, Bld: 188 mg/dL — ABNORMAL HIGH (ref 70–99)
Potassium: 4.6 mmol/L (ref 3.5–5.1)
Sodium: 140 mmol/L (ref 135–145)

## 2022-09-03 LAB — GLUCOSE, CAPILLARY
Glucose-Capillary: 142 mg/dL — ABNORMAL HIGH (ref 70–99)
Glucose-Capillary: 150 mg/dL — ABNORMAL HIGH (ref 70–99)
Glucose-Capillary: 158 mg/dL — ABNORMAL HIGH (ref 70–99)
Glucose-Capillary: 176 mg/dL — ABNORMAL HIGH (ref 70–99)
Glucose-Capillary: 188 mg/dL — ABNORMAL HIGH (ref 70–99)
Glucose-Capillary: 202 mg/dL — ABNORMAL HIGH (ref 70–99)
Glucose-Capillary: 224 mg/dL — ABNORMAL HIGH (ref 70–99)

## 2022-09-03 LAB — CBC
HCT: 25.9 % — ABNORMAL LOW (ref 39.0–52.0)
Hemoglobin: 8.5 g/dL — ABNORMAL LOW (ref 13.0–17.0)
MCH: 22.9 pg — ABNORMAL LOW (ref 26.0–34.0)
MCHC: 32.8 g/dL (ref 30.0–36.0)
MCV: 69.8 fL — ABNORMAL LOW (ref 80.0–100.0)
Platelets: 446 K/uL — ABNORMAL HIGH (ref 150–400)
RBC: 3.71 MIL/uL — ABNORMAL LOW (ref 4.22–5.81)
RDW: 24.1 % — ABNORMAL HIGH (ref 11.5–15.5)
WBC: 16 K/uL — ABNORMAL HIGH (ref 4.0–10.5)
nRBC: 0.7 % — ABNORMAL HIGH (ref 0.0–0.2)

## 2022-09-03 LAB — MAGNESIUM: Magnesium: 2.7 mg/dL — ABNORMAL HIGH (ref 1.7–2.4)

## 2022-09-03 LAB — PHOSPHORUS: Phosphorus: 11.1 mg/dL — ABNORMAL HIGH (ref 2.5–4.6)

## 2022-09-03 MED ORDER — LIDOCAINE-PRILOCAINE 2.5-2.5 % EX CREA: 1.0000 | TOPICAL_CREAM | CUTANEOUS | Status: DC | PRN

## 2022-09-03 MED ORDER — LIDOCAINE HCL (PF) 1 % IJ SOLN: 5.0000 mL | INTRAMUSCULAR | Status: DC | PRN

## 2022-09-03 MED ORDER — PENTAFLUOROPROP-TETRAFLUOROETH EX AERO: 1.0000 | INHALATION_SPRAY | CUTANEOUS | Status: DC | PRN

## 2022-09-03 MED ORDER — SEVELAMER CARBONATE 2.4 G PO PACK
2.4000 g | PACK | Freq: Three times a day (TID) | ORAL | Status: DC
  Administered 2022-09-03 – 2022-09-23 (×46): 2.4 g
  Filled 2022-09-03 (×61): qty 1

## 2022-09-03 NOTE — Progress Notes (Signed)
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 Planned for HD this AM and IR consulted for fistulogram. Pt is alert, eyes intermittently tracking but otherwise not following commands.   Objective Vitals:   09/03/22 0705 09/03/22 0708 09/03/22 0800 09/03/22 0809  BP: (!) 175/72     Pulse: 71 71  74  Resp: '18 12  15  '$ Temp:   97.8 F (36.6 C)   TempSrc:   Axillary   SpO2: 100% 100%  100%  Weight:      Height:       Physical Exam General: Alert male, orally intubated, NG tube. Alert but not following commands Heart: RRR, no murmurs, rubs or gallops Lungs: CTA anteriorly, + vent Abdomen: Soft, non-distended, +BS Extremities: 1+ edema b/l lower extremities Dialysis Access:  LUE AVF + bruit  Additional Objective Labs: Basic Metabolic Panel: Recent Labs  Lab 09/01/22 0538 09/02/22 0903 09/03/22 0351  NA 144 137 140  K 5.4* 4.7 4.6  CL 102 97* 92*  CO2 '22 25 23  '$ GLUCOSE 134* 194* 188*  BUN 139* 119* 162*  CREATININE 8.61* 7.17* 8.86*  CALCIUM 9.3 8.5* 9.8  PHOS 7.3* 9.7* 11.1*   Liver Function Tests: Recent Labs  Lab 08/28/22 0549 08/31/22 1009  AST 15 19  ALT 14 18  ALKPHOS 64 84  BILITOT 0.5 0.2*  PROT 6.2* 6.3*  ALBUMIN 2.1* 1.9*   No results for input(s): "LIPASE", "AMYLASE" in the last 168 hours. CBC: Recent Labs  Lab 08/30/22 0500 08/31/22 0842 09/01/22 0538 09/02/22 0903 09/03/22 0351  WBC 12.6* 9.8 11.8* 13.1* 16.0*  HGB 8.3* 7.7* 8.5* 8.8* 8.5*  HCT 26.1* 24.7* 26.2* 26.7* 25.9*  MCV 72.5* 72.9* 70.4* 69.9* 69.8*  PLT 194 214 292 353 446*   Blood Culture    Component Value Date/Time   SDES TRACHEAL ASPIRATE 08/26/2022 1212   SPECREQUEST NONE 08/26/2022 1212   CULT  08/26/2022 1212    ABUNDANT STREPTOCOCCUS ANGINOSIS RARE KLEBSIELLA OXYTOCA ABUNDANT HAEMOPHILUS INFLUENZAE BETA LACTAMASE NEGATIVE Performed at [facility], [place] [address], [place], [place]  [address]    REPTSTATUS 08/29/2022 FINAL 08/26/2022 1212    Cardiac Enzymes: No results for input(s): "CKTOTAL", "CKMB", "CKMBINDEX", "TROPONINI" in the last 168 hours. CBG: Recent Labs  Lab 09/02/22 1603 09/02/22 1939 09/03/22 0009 09/03/22 0345 09/03/22 0820  GLUCAP 176* 151* 150* 188* 158*   Iron Studies: No results for input(s): "IRON", "TIBC", "TRANSFERRIN", "FERRITIN" in the last 72 hours. '@lablastinr3'$ @ Studies/Results: CT HEAD WO CONTRAST (5MM)  Result Date: 09/02/2022 CLINICAL DATA:  Stroke, follow up EXAM: CT HEAD WITHOUT CONTRAST TECHNIQUE: Contiguous axial images were obtained from the base of the skull through the vertex without intravenous contrast. RADIATION DOSE REDUCTION: This exam was performed according to the departmental dose-optimization program which includes automated exposure control, adjustment of the mA and/or kV according to patient size and/or use of iterative reconstruction technique. COMPARISON:  MR head 08/31/2022, CT head 08/29/2022 FINDINGS: Brain: Cerebral ventricle sizes are concordant with the degree of cerebral volume loss. Patchy and confluent areas of decreased attenuation are noted throughout the deep and periventricular white matter of the cerebral hemispheres bilaterally, compatible with chronic microvascular ischemic disease. 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 No evidence of large-territorial acute infarction. No new parenchymal hemorrhage. Grossly stable 2.3 cm hyperdense round masslike lesion within the right cerebellum with associated vasogenic edema. No new mass identified.  Possible extension into the fourth ventricle with no associated hydrocephalus. No mass effect or midline shift. No hydrocephalus. Basilar cisterns are patent. Vascular: No hyperdense vessel. Skull: No acute fracture or focal lesion. Sinuses/Orbits: Paranasal sinuses and mastoid air cells are clear. Right lens replacement. Otherwise the orbits  are unremarkable. Other: Enteric and endotracheal tubes partially visualized. IMPRESSION: Grossly stable 2.3 cm hyperdense masslike lesion within the right cerebellum with associated vasogenic edema. Possible extension into the fourth ventricle with no associated hydrocephalus. Finding may represent a hemorrhagic mass. Electronically Signed   By: Iven Finn M.D.   On: 09/02/2022 17:03   Medications:  sodium chloride Stopped (08/30/22 0033)   feeding supplement (NEPRO CARB STEADY) 50 mL/hr at 09/03/22 0700   fentaNYL infusion INTRAVENOUS 100 mcg/hr (09/03/22 0700)   thiamine (VITAMIN B1) injection Stopped (09/03/22 0236)    amLODipine  5 mg Per Tube BID   carvedilol  25 mg Per Tube BID WC   Chlorhexidine Gluconate Cloth  6 each Topical Daily   Chlorhexidine Gluconate Cloth  6 each Topical Q0600   darbepoetin (ARANESP) injection - DIALYSIS  150 mcg Subcutaneous Q Sun-1800   feeding supplement (PROSource TF20)  60 mL Per Tube BID   hydrALAZINE  50 mg Per Tube Q8H   insulin aspart  0-15 Units Subcutaneous Q4H   multivitamin  1 tablet Per Tube QHS   nutrition supplement (JUVEN)  1 packet Per Tube BID BM   mouth rinse  15 mL Mouth Rinse Q2H   pantoprazole (PROTONIX) IV  40 mg Intravenous Daily   sodium zirconium cyclosilicate  10 g Per Tube Daily    Outpatient Dialysis Orders: SW MWF 3h 56mn  80kg   400/800   2/2 bath  Hep 2000  LFA AVF - last HD 11/13, post 80.1kg - mircera 150 q2, last 11/13 - hectorol 459m IV q HD  Assessment/Plan: Cerebellar IC hemorrhage/cerebral edema. Uncontrolled HTN felt to be cause. Goal Na was 148-152 at first, now allowing to normalize per neuro note -  Na 140 today.   Uncont HTN - On IV cleviprex, getting coreg/amlodipine per tube. closer to dry weight, will continue to titrate down volume as tolerated Hyperkalemia - recurrent issue, TF's changed to Nepro on 11/20. On daily lokelma.  ESRD: Usual MWF schedule, required CRRT 11/15-11/17/23. HD 11/20 d/t  hyperkalemia. Next HD today. 11/24. BUN very high, suspect access malfunction. Longer HD today (4 hours) and IR consulted for fistulogram.  Resp Failure: On Vent, per ICU teams. Anemia of ESRD: Hgb 8.5 today, continue Aranesp 15061mweekly while here. Secondary HPTH: CCa ok. Phos 11.1, starting renvela per NG tube. Continue VDRA.  DM2 - per pmd     SamAnice PaganiniA-C 09/03/2022, 9:10 AM  [facility] Pager: [phone]

## 2022-09-03 NOTE — Progress Notes (Signed)
Received patient in bed to unit.  Alert and oriented.  Informed consent signed and in chart.   Treatment initiated: 1036 Treatment completed: 1436  Patient tolerated well.  without acute distress.  Hand-off given to patient's nurse.   Access used: AVF Access issues: BFR 300  Total UF removed: 2.4 L Medication(s) given: none Post HD VS: 156/70 P 70 HE 12. Post HD weight: 82.2 kg   [facility]

## 2022-09-03 NOTE — Consult Note (Signed)
Chief Complaint: Patient was seen in consultation today for access dysfunction  Referring Physician(s): Dr. Pearson Grippe  Supervising Physician: Markus Daft  Patient Status: [facility] - In-pt  History of Present Illness: Randy Ross is a 51 y.o. male with history of ESRD on HD via left forearm AVF admitted after witnessed onset of slurred speech/headache 08/25/22. 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 He remains intubated/sedated.  He has continued to undergo HD via left forearm AVF, however concern noted for access issues due to ongoing azotemia and poor clearance. IR consulted for fistulagram.   Past Medical History:  Diagnosis Date   Anemia    CKD (chronic kidney disease)    on wed 12/09/21   Diabetes mellitus without complication ([place])    type 2   Hypertension    Renal disorder     Past Surgical History:  Procedure Laterality Date   AV FISTULA PLACEMENT Left 12/10/2021   Procedure: LEFT RADIAL CEPHALIC  ARTERIOVENOUS (AV) FISTULA;  Surgeon: Waynetta Sandy, MD;  Location: [facility];  Service: Vascular;  Laterality: Left;   IR FLUORO GUIDE CV LINE RIGHT  12/09/2021   IR US GUIDE VASC ACCESS RIGHT  12/09/2021   LUMBAR [place] SURGERY  2021   L5-S1    Allergies: Gabapentin and Metformin and related  Medications: Prior to Admission medications   Medication Sig Start Date End Date Taking? Authorizing Provider  acetaminophen (TYLENOL) 325 MG tablet Take 2 tablets (650 mg total) by mouth every 6 (six) hours as needed for moderate pain. Patient taking differently: Take 650 mg by mouth as needed for moderate pain or headache. 05/19/22  Yes Jaynee Eagles, PA-C  amLODipine (NORVASC) 10 MG tablet Take 10 mg by mouth at bedtime. 04/23/22  Yes [provider]  carvedilol (COREG) 25 MG tablet Take 25 mg by mouth daily. 05/31/22  Yes [provider]  Emollient (CETAPHIL) cream Apply 1 application  topically daily as needed (itching).   Yes [provider]   isosorbide mononitrate (IMDUR) 30 MG 24 hr tablet Take 30 mg by mouth at bedtime. 04/23/22  Yes [provider]  losartan (COZAAR) 100 MG tablet Take 100 mg by mouth daily. 03/15/22  Yes [provider]  PRESCRIPTION MEDICATION Inject 10 Units into the skin daily. [place]   Yes [provider]  calcitRIOL (ROCALTROL) 0.25 MCG capsule Take 1 capsule (0.25 mcg total) by mouth daily. 12/16/21   Elgergawy, Silver Huguenin, MD  calcium acetate (PHOSLO) 667 MG capsule Take 1,334 mg by mouth 3 (three) times daily. 03/31/22   [provider]  hydrALAZINE (APRESOLINE) 50 MG tablet Take 1 tablet (50 mg total) by mouth 3 (three) times daily. Patient not taking: Reported on 03/31/2022 12/15/21   Elgergawy, Silver Huguenin, MD  methocarbamol (ROBAXIN) 500 MG tablet Take 1 tablet (500 mg total) by mouth 2 (two) times daily. Patient not taking: Reported on 08/25/2022 05/19/22   Jaynee Eagles, PA-C  multivitamin (RENA-VIT) TABS tablet Take 1 tablet by mouth at bedtime. 12/15/21   Elgergawy, Silver Huguenin, MD  SEMGLEE, YFGN, 100 UNIT/ML Pen Inject 10 Units into the skin daily. 04/23/22   [provider]  tetrahydrozoline-zinc (VISINE-AC) 0.05-0.25 % ophthalmic solution Place 1 drop into both eyes 3 (three) times daily as needed (dry eyes).    [provider]  tiZANidine (ZANAFLEX) 4 MG tablet Take 4 mg by mouth 3 (three) times daily as needed for muscle spasms. Patient not taking: Reported on 08/25/2022    [provider]  History reviewed. No pertinent family history.  Social History   Socioeconomic History   Marital status: Single    Spouse name: Not on file   Number of children: Not on file   Years of education: Not on file   Highest education level: Not on file  Occupational History   Not on file  Tobacco Use   Smoking status: Some Days    Packs/day: 0.15    Types: Cigarettes   Smokeless tobacco: Never  Vaping Use   Vaping Use: Never used  Substance and Sexual  Activity   Alcohol use: Not Currently   Drug use: Not Currently   Sexual activity: Not Currently  Other Topics Concern   Not on file  Social History Narrative   ** Merged History Encounter **       Social Determinants of Health   Financial Resource Strain: Not on file  Food Insecurity: Not on file  Transportation Needs: Not on file  Physical Activity: Not on file  Stress: Not on file  Social Connections: Not on file     Review of Systems: A 12 point ROS discussed and pertinent positives are indicated in the HPI above.  All other systems are negative.  Review of Systems  Unable to perform ROS: Intubated    Vital Signs: BP (!) 149/75   Pulse 67   Temp 97.6 F (36.4 C) (Axillary)   Resp (!) 8   Ht '5\' 5"'$  (1.651 m)   Wt 186 lb 8.2 oz (84.6 kg)   SpO2 100%   BMI 31.04 kg/m   Physical Exam Vitals and nursing note reviewed.  Constitutional:      Comments: intubated  Cardiovascular:     Rate and Rhythm: Normal rate and regular rhythm.  Pulmonary:     Comments: intubated Skin:    Comments: Left forearm AVF, bruit and thrill and distal portion which is currently accessed with side-by-side needles, functioning with decreased flow rate.  Neurological:     Comments: Sedated, not following commands      MD Evaluation Airway: WNL Heart: WNL Abdomen: WNL Chest/ Lungs: WNL ASA  Classification: 3 Mallampati/Airway Score: Two   Imaging: CT HEAD WO CONTRAST (5MM)  Result Date: 09/02/2022 CLINICAL DATA:  Stroke, follow up EXAM: CT HEAD WITHOUT CONTRAST TECHNIQUE: Contiguous axial images were obtained from the base of the skull through the vertex without intravenous contrast. RADIATION DOSE REDUCTION: This exam was performed according to the departmental dose-optimization program which includes automated exposure control, adjustment of the mA and/or kV according to patient size and/or use of iterative reconstruction technique. COMPARISON:  MR head 08/31/2022, CT head  08/29/2022 FINDINGS: Brain: Cerebral ventricle sizes are concordant with the degree of cerebral volume loss. Patchy and confluent areas of decreased attenuation are noted throughout the deep and periventricular white matter of the cerebral hemispheres bilaterally, compatible with chronic microvascular ischemic disease. 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 No evidence of large-territorial acute infarction. No new parenchymal hemorrhage. Grossly stable 2.3 cm hyperdense round masslike lesion within the right cerebellum with associated vasogenic edema. No new mass identified. Possible extension into the fourth ventricle with no associated hydrocephalus. No mass effect or midline shift. No hydrocephalus. Basilar cisterns are patent. Vascular: No hyperdense vessel. Skull: No acute fracture or focal lesion. Sinuses/Orbits: Paranasal sinuses and mastoid air cells are clear. Right lens replacement. Otherwise the orbits are unremarkable. Other: Enteric and endotracheal tubes partially visualized. IMPRESSION: Grossly stable 2.3 cm hyperdense masslike lesion within  the right cerebellum with associated vasogenic edema. Possible extension into the fourth ventricle with no associated hydrocephalus. Finding may represent a hemorrhagic mass. Electronically Signed   By: Iven Finn M.D.   On: 09/02/2022 17:03   MR BRAIN WO CONTRAST  Result Date: 08/31/2022 CLINICAL DATA:  Stroke, hemorrhagic EXAM: MRI HEAD WITHOUT CONTRAST TECHNIQUE: Multiplanar, multiecho pulse sequences of the brain and surrounding structures were obtained without intravenous contrast. COMPARISON:  CT head August 29, 2022. FINDINGS: Brain: When accounting for differences in technique, no substantial change is acute/recent intraparenchymal hemorrhage within the right cerebellum. Surrounding edema in the face of the fourth ventricle without hydrocephalus. Many small acute infarcts throughout bilateral frontal and  parietal white matter and the corpus callosum, most confluent/large in the frontal white matter bilaterally. Mild associated edema without significant mass effect. No midline shift. No mass lesion. Evidence of prior hemorrhage in the right basal ganglia with additional scattered punctate foci of susceptibility artifact, compatible with prior microhemorrhages. Vascular: Major arterial flow voids are maintained at the skull base. Skull and upper cervical spine: Normal marrow signal. Sinuses/Orbits: Extensive paranasal sinus disease. No acute orbital findings. Other: Bilateral mastoid effusions. IMPRESSION: 1. When accounting for differences in technique, no substantial change is acute/recent intraparenchymal hemorrhage within the right cerebellum. Surrounding edema in the face of the fourth ventricle without hydrocephalus. 2. Many small acute infarcts throughout bilateral frontal and parietal white matter and the corpus callosum, potentially embolic. 3. Evidence of prior basal ganglia hemorrhage and scattered chronic microhemorrhages, possibly hypertensive in etiology. Electronically Signed   By: Margaretha Sheffield M.D.   On: 08/31/2022 13:27   DG CHEST PORT 1 VIEW  Result Date: 08/31/2022 CLINICAL DATA:  Intubated patient.  Possible aspiration. EXAM: PORTABLE CHEST 1 VIEW COMPARISON:  Radiographs 08/27/2022 and 08/26/2022. FINDINGS: 0946 hours. The endotracheal tube, left IJ central venous catheter and feeding to appear unchanged in position. The tip of the latter is not visualized. The heart size and mediastinal contours are stable. Diffuse interstitial opacities throughout both lungs have partially cleared in the interval. There is no confluent airspace opacity, pneumothorax or significant pleural effusion. The bones appear unchanged.  Telemetry leads overlie the chest. IMPRESSION: 1. Interval partial clearing of diffuse bilateral interstitial opacities, likely due to resolving edema. No new findings. 2. Stable  support system. Electronically Signed   By: Richardean Sale M.D.   On: 08/31/2022 10:01   EEG adult  Result Date: 08/29/2022 Lora Havens, MD     08/29/2022 11:46 AM Patient Name: Randy Ross MRN: [id number] Epilepsy Attending: Lora Havens Referring Physician/Provider: Janine Ores, NP Date: 08/29/2022 Duration: 21.40 mins Patient history: 51yo M with ams. EEG to evaluate for seizure. Level of alertness:  lethargic AEDs during EEG study: propofol Technical aspects: This EEG study was done with scalp electrodes positioned according to the 10-20 International system of electrode placement. Electrical activity was reviewed with band pass filter of 1-'70Hz'$ , sensitivity of 7 uV/mm, display speed of 66m/sec with a '60Hz'$  notched filter applied as appropriate. EEG data were recorded continuously and digitally stored.  Video monitoring was available and reviewed as appropriate. Description: EEG showed continuous generalized 3 to 6 Hz theta-delta slowing. Intermittent generalized periodic discharges with triphasic morphology at 1 Hz were also noted, more prominent when awake/stimulated. Hyperventilation and photic stimulation were not performed.   ABNORMALITY - Periodic discharges with triphasic morphology, generalized ( GPDs) - Continuous slow, generalized IMPRESSION: This study showed generalized periodic discharges with triphasic morphology which is on the  ictal-interictal continuum. However, the frequency, morphology and reactivity to stimulation is most likely indicative of toxic-metabolic causes. . Additionally there is moderate to severe diffuse encephalopathy, nonspecific etiology. No seizures were seen throughout the recording. [place]   CT HEAD WO CONTRAST (5MM)  Result Date: 08/29/2022 CLINICAL DATA:  Intracranial hemorrhage EXAM: CT HEAD WITHOUT CONTRAST TECHNIQUE: Contiguous axial images were obtained from the base of the skull through the vertex without intravenous contrast.  RADIATION DOSE REDUCTION: This exam was performed according to the departmental dose-optimization program which includes automated exposure control, adjustment of the mA and/or kV according to patient size and/or use of iterative reconstruction technique. COMPARISON:  None Available. FINDINGS: Brain: Unchanged intraparenchymal hemorrhage in the right cerebellar hemisphere with possible minimal extension into the fourth ventricle. No hydrocephalus. Vascular: No abnormal hyperdensity of the major intracranial arteries or dural venous sinuses. No intracranial atherosclerosis. Skull: The visualized skull base, calvarium and extracranial soft tissues are normal. Sinuses/Orbits: No fluid levels or advanced mucosal thickening of the visualized paranasal sinuses. No mastoid or middle ear effusion. The orbits are normal. IMPRESSION: Unchanged intraparenchymal hemorrhage in the right cerebellar hemisphere with possible minimal extension into the fourth ventricle. No hydrocephalus. Electronically Signed   By: Ulyses Jarred M.D.   On: 08/29/2022 03:44   CT HEAD WO CONTRAST (5MM)  Result Date: 08/27/2022 CLINICAL DATA:  Stroke follow-up EXAM: CT HEAD WITHOUT CONTRAST TECHNIQUE: Contiguous axial images were obtained from the base of the skull through the vertex without intravenous contrast. RADIATION DOSE REDUCTION: This exam was performed according to the departmental dose-optimization program which includes automated exposure control, adjustment of the mA and/or kV according to patient size and/or use of iterative reconstruction technique. COMPARISON:  Two days ago FINDINGS: Brain: Unchanged hematoma and edema in the right cerebellum approaching the fourth ventricle which is partially carotid inferiorly. No hydrocephalus. Low-density new from admission head CT beneath the right cingulate gyrus and at the right corpus callosum. Vascular: Atheromatous calcification. Skull: No acute or aggressive finding Sinuses/Orbits:  Generalized sinus inflammation in the setting of intubation. IMPRESSION: 1. Small acute infarct at the right corpus callosum body and cingulate white matter, not seen on admission head CT. 2. Non progressed right cerebellar hematoma and swelling. No hydrocephalus. Electronically Signed   By: Jorje Guild M.D.   On: 08/27/2022 12:09   DG CHEST PORT 1 VIEW  Result Date: 08/27/2022 CLINICAL DATA:  Hypoxia EXAM: PORTABLE CHEST 1 VIEW COMPARISON:  Radiograph 08/26/2022 FINDINGS: Endotracheal tube overlies the midthoracic trachea approximately 3.4 cm above the carina. Left neck approach catheter tip overlies the distal SVC. Feeding tube passes below the diaphragm, tip excluded by collimation. Unchanged enlarged cardiac silhouette. There are mild interstitial opacities. Improved aeration of the left lung base. There is no pleural effusion or pneumothorax. Bones are unchanged. IMPRESSION: Cardiomegaly with mild pulmonary edema. Improved aeration of the left lung base. Electronically Signed   By: Maurine Simmering M.D.   On: 08/27/2022 08:38   Portable Chest xray  Result Date: 08/26/2022 CLINICAL DATA:  Respiratory failure EXAM: PORTABLE CHEST 1 VIEW COMPARISON:  08/25/2022 FINDINGS: Endotracheal tube terminates 2.0 cm above the carina. Enteric tube courses below the diaphragm with distal tip beyond the inferior margin of the film. Dual lumen left IJ central venous catheter remains positioned at the superior cavoatrial junction. Stable cardiomegaly. Pulmonary vascular congestion. Persistent streaky bibasilar opacities. No large pleural fluid collection. No pneumothorax. IMPRESSION: 1. Cardiomegaly with pulmonary vascular congestion. 2. Persistent streaky bibasilar opacities, atelectasis versus  infiltrate. Electronically Signed   By: Davina Poke D.O.   On: 08/26/2022 08:06   CT HEAD WO CONTRAST (5MM)  Result Date: 08/26/2022 CLINICAL DATA:  Hemorrhagic stroke EXAM: CT HEAD WITHOUT CONTRAST TECHNIQUE:  Contiguous axial images were obtained from the base of the skull through the vertex without intravenous contrast. RADIATION DOSE REDUCTION: This exam was performed according to the departmental dose-optimization program which includes automated exposure control, adjustment of the mA and/or kV according to patient size and/or use of iterative reconstruction technique. COMPARISON:  08/25/2022 at 5:42 a.m. FINDINGS: Brain: Unchanged size of intraparenchymal hematoma in the right cerebellum with mass effect on the fourth ventricle. No hydrocephalus. There is periventricular hypoattenuation compatible with chronic microvascular disease. Generalized volume loss. Vascular: No abnormal hyperdensity of the major intracranial arteries or dural venous sinuses. No intracranial atherosclerosis. Skull: The visualized skull base, calvarium and extracranial soft tissues are normal. Sinuses/Orbits: No fluid levels or advanced mucosal thickening of the visualized paranasal sinuses. No mastoid or middle ear effusion. The orbits are normal. IMPRESSION: Unchanged size of intraparenchymal hematoma in the right cerebellum with mass effect on the fourth ventricle. No hydrocephalus. Electronically Signed   By: Ulyses Jarred M.D.   On: 08/26/2022 00:21   DG Chest Port 1 View  Result Date: 08/25/2022 CLINICAL DATA:  Central line placement. EXAM: PORTABLE CHEST 1 VIEW COMPARISON:  Chest 08/25/2022 FINDINGS: Endotracheal tube has been advanced now 1 cm above the carina. Feeding tube enters the stomach with the tip at the gastric antrum or duodenal bulb. Left jugular dual lumen catheter extends into the SVC at the cavoatrial junction. No pneumothorax. Cardiac enlargement with mild vascular congestion unchanged. Mild bibasilar atelectasis unchanged. IMPRESSION: 1. Left jugular catheter tip at the cavoatrial junction. No pneumothorax. 2. Endotracheal tube 1 cm above the carina. 3. Mild bibasilar atelectasis unchanged. 4. Feeding tube tip in  the region of the gastric antrum or duodenal bulb. Electronically Signed   By: Franchot Gallo M.D.   On: 08/25/2022 12:57   ECHOCARDIOGRAM COMPLETE  Result Date: 08/25/2022    ECHOCARDIOGRAM REPORT   Patient Name:   Randy Ross Date of Exam: 08/25/2022 Medical Rec #:  [id number]         Height:       65.0 in Accession #:    [id number]        Weight:       177.2 lb Date of Birth:  06-04-1971        BSA:          1.879 m Patient Age:    50 years          BP:           136/59 mmHg Patient Gender: M                 HR:           62 bpm. Exam Location:  Inpatient Procedure: 2D Echo, Cardiac Doppler and Color Doppler Indications:    Stroke  History:        Patient has no prior history of Echocardiogram examinations.                 Risk Factors:Diabetes and Hypertension. ESRD.  Sonographer:    Clayton Lefort RDCS (AE) Referring Phys: Rosalin Hawking  Sonographer Comments: Echo performed with patient supine and on artificial respirator and suboptimal subcostal window. IMPRESSIONS  1. Left ventricular ejection fraction, by estimation, is 50 to 55%. The left ventricle has low  normal function. The left ventricle has no regional wall motion abnormalities. There is moderate left ventricular hypertrophy. Left ventricular diastolic parameters are consistent with Grade I diastolic dysfunction (impaired relaxation).  2. Right ventricular systolic function is normal. The right ventricular size is normal.  3. The mitral valve is normal in structure. No evidence of mitral valve regurgitation. No evidence of mitral stenosis.  4. The aortic valve is normal in structure. Aortic valve regurgitation is trivial. No aortic stenosis is present.  5. The inferior vena cava is normal in size with greater than 50% respiratory variability, suggesting right atrial pressure of 3 mmHg. Conclusion(s)/Recommendation(s): No intracardiac source of embolism detected on this transthoracic study. Consider a transesophageal echocardiogram to exclude  cardiac source of embolism if clinically indicated. FINDINGS  Left Ventricle: Left ventricular ejection fraction, by estimation, is 50 to 55%. The left ventricle has low normal function. The left ventricle has no regional wall motion abnormalities. The left ventricular internal cavity size was normal in size. There is moderate left ventricular hypertrophy. Left ventricular diastolic parameters are consistent with Grade I diastolic dysfunction (impaired relaxation). Right Ventricle: The right ventricular size is normal. No increase in right ventricular wall thickness. Right ventricular systolic function is normal. Left Atrium: Left atrial size was normal in size. Right Atrium: Right atrial size was normal in size. Pericardium: Trivial pericardial effusion is present. The pericardial effusion is posterior and lateral to the left ventricle. Mitral Valve: The mitral valve is normal in structure. No evidence of mitral valve regurgitation. No evidence of mitral valve stenosis. Tricuspid Valve: The tricuspid valve is normal in structure. Tricuspid valve regurgitation is trivial. No evidence of tricuspid stenosis. Aortic Valve: The aortic valve is normal in structure. Aortic valve regurgitation is trivial. No aortic stenosis is present. Aortic valve mean gradient measures 2.0 mmHg. Aortic valve peak gradient measures 3.7 mmHg. Aortic valve area, by VTI measures 3.76 cm. Pulmonic Valve: The pulmonic valve was normal in structure. Pulmonic valve regurgitation is not visualized. No evidence of pulmonic stenosis. Aorta: The aortic root is normal in size and structure. Venous: The inferior vena cava is normal in size with greater than 50% respiratory variability, suggesting right atrial pressure of 3 mmHg. IAS/Shunts: No atrial level shunt detected by color flow Doppler.  LEFT VENTRICLE PLAX 2D LV PW:         1.88 cm   Diastology LV IVS:        1.36 cm   LV e' medial:    3.81 cm/s LVOT diam:     2.20 cm   LV E/e' medial:  17.5  LV SV:         69        LV e' lateral:   5.55 cm/s LV SV Index:   37        LV E/e' lateral: 12.0 LVOT Area:     3.80 cm  RIGHT VENTRICLE             IVC RV Basal diam:  2.80 cm     IVC diam: 1.80 cm RV S prime:     15.10 cm/s TAPSE (M-mode): 2.2 cm LEFT ATRIUM             Index        RIGHT ATRIUM           Index LA Vol (A2C):   42.8 ml 22.78 ml/m  RA Area:     10.80 cm LA Vol (A4C):   44.0 ml 23.[place]  ml/m  RA Volume:   17.70 ml  9.42 ml/m LA Biplane Vol: 45.9 ml 24.43 ml/m  AORTIC VALVE AV Area (Vmax):    3.67 cm AV Area (Vmean):   3.39 cm AV Area (VTI):     3.76 cm AV Vmax:           95.80 cm/s AV Vmean:          62.200 cm/s AV VTI:            0.183 m AV Peak Grad:      3.7 mmHg AV Mean Grad:      2.0 mmHg LVOT Vmax:         92.40 cm/s LVOT Vmean:        55.400 cm/s LVOT VTI:          0.181 m LVOT/AV VTI ratio: 0.99  AORTA Ao Root diam: 3.50 cm Ao Asc diam:  3.70 cm MITRAL VALVE MV Area (PHT): 2.48 cm    SHUNTS MV Decel Time: 306 msec    Systemic VTI:  0.18 m MV E velocity: 66.50 cm/s  Systemic Diam: 2.20 cm MV A velocity: 75.90 cm/s MV E/A ratio:  0.88 Candee Furbish MD Electronically signed by Candee Furbish MD Signature Date/Time: 08/25/2022/12:17:53 PM    Final    CT ANGIO HEAD NECK W WO CM (CODE STROKE)  Result Date: 08/25/2022 CLINICAL DATA:  Acute neuro deficit with stroke suspected. EXAM: CT ANGIOGRAPHY HEAD AND NECK TECHNIQUE: Multidetector CT imaging of the head and neck was performed using the standard protocol during bolus administration of intravenous contrast. Multiplanar CT image reconstructions and MIPs were obtained to evaluate the vascular anatomy. Carotid stenosis measurements (when applicable) are obtained utilizing NASCET criteria, using the distal internal carotid diameter as the denominator. RADIATION DOSE REDUCTION: This exam was performed according to the departmental dose-optimization program which includes automated exposure control, adjustment of the mA and/or kV according to  patient size and/or use of iterative reconstruction technique. CONTRAST:  62m OMNIPAQUE IOHEXOL 350 MG/ML SOLN COMPARISON:  Head CT from yesterday FINDINGS: CT HEAD FINDINGS Brain: Ovoid hematoma in the deep right cerebellum measuring 3.3 cm in length by 2.5 cm in maximal thickness, extending towards and likely into the fourth ventricle (triangular shape on sagittal reformats) but no intraventricular spread. Rim of adjacent edema without hydrocephalus. No visible infarct or mass. Vascular: See below Skull: Negative Sinuses/Orbits: Right cataract resection. Review of the MIP images confirms the above findings CTA NECK FINDINGS Aortic arch: No acute finding Right carotid system: Atheromatous plaque at the bifurcation without stenosis or ulceration. Left carotid system: Mild atheromatous plaque at the bifurcation. No stenosis or ulceration. Vertebral arteries: No proximal subclavian stenosis. The vertebral arteries are smoothly contoured and diffusely patent. Skeleton: Spinal curvature without acute finding Other neck: Tongue distortion from endotracheal tube. Upper chest: Atelectasis seen in the apical lungs Review of the MIP images confirms the above findings CTA HEAD FINDINGS Anterior circulation: Atheromatous calcification along the carotid siphons without flow reducing stenosis or aneurysm. Posterior circulation: Vertebral and basilar tortuosity with atheromatous calcification. No branch occlusion, beading, aneurysm, or vascular malformation. No spot sign Venous sinuses: Unremarkable for the arterial phase Anatomic variants: None significant Review of the MIP images confirms the above findings IMPRESSION: 1. No vascular lesion or spot sign seen at the right cerebellar hemorrhage. The hemorrhage is size stable from earlier today. There is likely extension into the fourth ventricle but no intraventricular spread or hydrocephalus. 2. Premature atherosclerosis with notable vessel  tortuosity suggesting chronic  hypertension. Electronically Signed   By: Jorje Guild M.D.   On: 08/25/2022 06:10   CT HEAD CODE STROKE WO CONTRAST  Addendum Date: 08/25/2022   ADDENDUM REPORT: 08/25/2022 02:33 ADDENDUM: Critical Value/emergent results were called by telephone at the time of interpretation on 08/24/2022 at 11:58 pm to provider [facility] , who verbally acknowledged these results. Electronically Signed   By: Ulyses Jarred M.D.   On: 08/25/2022 02:33   Result Date: 08/25/2022 CLINICAL DATA:  Code stroke.  Acute neurologic deficits EXAM: CT HEAD WITHOUT CONTRAST TECHNIQUE: Contiguous axial images were obtained from the base of the skull through the vertex without intravenous contrast. RADIATION DOSE REDUCTION: This exam was performed according to the departmental dose-optimization program which includes automated exposure control, adjustment of the mA and/or kV according to patient size and/or use of iterative reconstruction technique. COMPARISON:  None Available. FINDINGS: Brain: There is an acute intraparenchymal hematoma centered in the right cerebellar hemisphere measuring 2.6 x 1.8 cm with a craniocaudal dimension of 2.2 cm (volume equals 5.1 mL). There is severe mass effect on the fourth ventricle and cerebral aqueduct. No hydrocephalus. There is periventricular hypoattenuation compatible with chronic microvascular disease. Vascular: No abnormal hyperdensity of the major intracranial arteries or dural venous sinuses. No intracranial atherosclerosis. Skull: The visualized skull base, calvarium and extracranial soft tissues are normal. Sinuses/Orbits: No fluid levels or advanced mucosal thickening of the visualized paranasal sinuses. No mastoid or middle ear effusion. The orbits are normal. IMPRESSION: Acute intraparenchymal hematoma centered in the right cerebellar hemisphere with severe mass effect on the fourth ventricle and cerebral aqueduct. No hydrocephalus. Electronically Signed: By: Ulyses Jarred M.D. On:  08/24/2022 23:42   DG Chest Port 1 View  Result Date: 08/25/2022 CLINICAL DATA:  Respiratory failure EXAM: PORTABLE CHEST 1 VIEW COMPARISON:  05/19/2022 FINDINGS: Endotracheal tube is in place 6.2 cm above the carina with its tip at the level of the clavicular heads. Lung volumes are small and there is right basilar atelectasis. No pneumothorax or pleural effusion. Mild cardiomegaly is present. Trace perihilar interstitial pulmonary edema noted. No acute bone abnormality. IMPRESSION: 1. Endotracheal tube at the thoracic inlet, 6.2 cm above the carina. 2. Pulmonary hypoinflation. 3. Trace perihilar interstitial pulmonary edema, possibly cardiogenic in nature. 4. Mild cardiomegaly. Electronically Signed   By: Fidela Salisbury M.D.   On: 08/25/2022 00:43   DG Chest 2 View  Result Date: 08/20/2022 CLINICAL DATA:  Shortness of breath EXAM: CHEST - 2 VIEW COMPARISON:  None Available. FINDINGS: Cardiomegaly. Pulmonary vascular congestion. Aortic atherosclerotic calcification. Hazy airspace opacities in the lower lungs. No pleural effusion or pneumothorax. No acute osseous abnormality. IMPRESSION: Cardiomegaly and suspected pulmonary edema, infection considered less likely. Electronically Signed   By: Placido Sou M.D.   On: 08/20/2022 03:44    Labs:  CBC: Recent Labs    08/31/22 0842 09/01/22 0538 09/02/22 0903 09/03/22 0351  WBC 9.8 11.8* 13.1* 16.0*  HGB 7.7* 8.5* 8.8* 8.5*  HCT 24.7* 26.2* 26.7* 25.9*  PLT 214 292 353 446*    COAGS: Recent Labs    08/24/22 2330  INR 1.0  APTT 31    BMP: Recent Labs    08/31/22 0842 08/31/22 1354 08/31/22 1958 09/01/22 0538 09/02/22 0903 09/03/22 0351  NA 142   < > 143 144 137 140  K 5.6*  --   --  5.4* 4.7 4.6  CL 102  --   --  102 97* 92*  CO2  25  --   --  '22 25 23  '$ GLUCOSE 211*  --   --  134* 194* 188*  BUN 94*  --   --  139* 119* 162*  CALCIUM 8.9  --   --  9.3 8.5* 9.8  CREATININE 6.64*  --   --  8.61* 7.17* 8.86*  GFRNONAA 9*  --    --  7* 9* 7*   < > = values in this interval not displayed.    LIVER FUNCTION TESTS: Recent Labs    07/21/22 2147 08/24/22 2330 08/25/22 1621 08/26/22 1742 08/27/22 0545 08/28/22 0549 08/31/22 1009  BILITOT 0.5 0.6  --   --   --  0.5 0.2*  AST 17 15  --   --   --  15 19  ALT 26 17  --   --   --  14 18  ALKPHOS 71 67  --   --   --  64 84  PROT 7.6 7.0  --   --   --  6.2* 6.3*  ALBUMIN 3.5 3.4*   < > 2.4* 2.1* 2.1* 1.9*   < > = values in this interval not displayed.    TUMOR MARKERS: No results for input(s): "AFPTM", "CEA", "CA199", "CHROMGRNA" in the last 8760 hours.  Assessment and Plan: AVF malfunction  Patient admitted with right sided cerebellar ICH, remains intubated/sedation.  Unable to follow commands, spontaneous, non-purposeful movement.  He is on dialysis at baseline via left forearm fistula.  He is running on the machine with slow flow rate, however concern for recirculation due to needle placement.  IR consulted for fistulagram with intervention.  Patient previously had a temp cath, however this has since been removed.   IR unable to accommodate for fistulagram today.  Dr. Joelyn Oms aware.   Recommend temp cath placement at bedside if needed.  IR will re-evaluate for fistulagram early next week if still warranted.   Thank you for this interesting consult.  I greatly enjoyed meeting Matias Tauer and look forward to participating in their care.  A copy of this report was sent to the requesting provider on this date.  Electronically Signed: Docia Barrier, PA 09/03/2022, 2:30 PM   I spent a total of 40 Minutes    in face to face in clinical consultation, greater than 50% of which was counseling/coordinating care for AVF malfunction.

## 2022-09-03 NOTE — Progress Notes (Signed)
NAMEJacolby Ross, MRN:  [id number], DOB:  11/08/1970, LOS: 39 ADMISSION DATE:  08/24/2022, CONSULTATION DATE:  08/25/2022 REFERRING MD:  [place] - Neuro CHIEF COMPLAINT:  AMS   History of Present Illness:  51 year old man who presented to [facility] ED 11/14 with left sided visual preference, left sided weakness, slurred speech. He was also profoundly hypertensive with SBP over 200s (highest documented is SBP 230). PMHx significant for HTN, T2DM, ESRD on HD (MWF).  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 He was intubated in the ED and neurosurgery was consulted for consideration of EVD.  He was admitted by neurology and PCCM was called in consultation for vent management.  Pertinent Medical History:   Past Medical History:  Diagnosis Date   Anemia    CKD (chronic kidney disease)    on wed 12/09/21   Diabetes mellitus without complication ([place])    type 2   Hypertension    Renal disorder    Significant Hospital Events: Including procedures, antibiotic start and stop dates in addition to other pertinent events   11/15 - Admitted overnight. 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  CTA Head/Neck without e/o vascular lesion at R cerebellar hemorrhage. HTS 3% initiated. Trialysis catheter placed L IJ for CRRT/HTS 23% (per Stroke team). CRRT initiated. 11/16 Low grade temp with Tmax 99.4, WBC uptrending 25.9 (9.0). CXR with increased RLL opacities. Cefepime initiated. Resp Cx/TA > mult spec h.flu, strep species, klebsiella species. TG elevated, weaning prop. 11/17 Ongoing low grade temps Tmax 100.83F. WBC downtrending 19.3 (25.9). CRRT filter clotting causing blood loss, Hgb 6.3. CRRT stopped, 1U PRBCs transfused. HD to resume. Repeat CT Head with small acute infarct at R corpus  callosum body/cingulate white matter, stable R cerebellar hematoma. CXR improved on cefepime. 11/19 stirs to touch  Interim History / Subjective:   On PSV 5/5 today  Objective:  Blood pressure (!) 175/72, pulse 74, temperature 97.8 F (36.6 C), temperature source Axillary, resp. rate 15, height '5\' 5"'$  (1.651 m), weight 84.6 kg, SpO2 100 %.    Vent Mode: PSV;CPAP FiO2 (%):  [40 %] 40 % Set Rate:  [16 bmp] 16 bmp Vt Set:  [490 mL] 490 mL PEEP:  [5 cmH20] 5 cmH20 Pressure Support:  [5 cmH20] 5 cmH20 Plateau Pressure:  [12 cmH20-16 cmH20] 12 cmH20   Intake/Output Summary (Last 24 hours) at 09/03/2022 0919 Last data filed at 09/03/2022 0700 Gross per 24 hour  Intake 1665.03 ml  Output 300 ml  Net 1365.03 ml   Filed Weights   08/30/22 0936 09/01/22 0800 09/01/22 1142  Weight: 80.9 kg 86.1 kg 84.6 kg   Physical Examination: Gen:      No acute distress HEENT:  EOMI, sclera anicteric, ETT Neck:     No masses; no thyromegaly Lungs:    Clear to auscultation bilaterally; normal respiratory effort CV:         Regular rate and rhythm; no murmurs Abd:      + bowel sounds; soft, non-tender; no palpable masses, no distension Ext:    No edema; adequate peripheral perfusion Skin:      Warm and dry; no rash Neuro: Sedated, opens eyes to stimuli, right hemiparesis.  Labs/imaging reviewed Significant for creatinine 8.86 BBC count 16.0, hemoglobin 8.5, platelets 446 No  new imaging  Labs/imaging personally reviewed:  CT head 11/4 > 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 CTA head 11/14 > no e/o vascular lesion at R cerebellar hemorrhage MRI brain 11/21 > no significant changes, many small acute infarcts  Assessment & Plan:   Acute IPH with severe mass effect/brain swelling - presumed hypertensive in nature Repeat CT Head with small acute infarct at R corpus callosum body/cingulate  white matter, stable R cerebellar hematoma. Hypertensive emergency - hx of HTN, unsure of compliance with med regimen Neurology thinking encephalopathy is out of proportion to [place] - MRI brain 11/21  - Continue frequent neurochecks - Continue Coreg, amlodipine, hydralazine - Stroke service primary - AC/VTE ppx per primary - Frequent neuro checks.  - D/c losartan 11/22 given hyperkalemia. - PRN hydralazine, labetalol.  - SBP goal < 153mHg - Continue high dose thiamine for now but low threshold to d/c as suspect his encephalopathy is due to [place] multiple CVAs   Acute hypoxic respiratory failure with inability to protect the airway - s/p intubation in ED PNA RLL due to klebsiella, strep, h.flu - VAP prevention - Finished 7 days of antibiotics on 11/22 - SBT as mental status allows - Encephalopathy remains barrier to extubation - Continue low dose Fentanyl infusion for comfort and to abate coughing spells  Hx ESRD on HD MWF Hyperkalemia  - Dialysis per nephrology.  Temporary HD cath has been removed. - Per nephrology   Anemia 2/2 CKD and acute blood loss associated with CRRT, now on HD - Trend CBC - Transfuse per ICU guidelines.   DM2 with hyperglycemia - CBG monitoring and SSI  At risk for malnutrition - TF   Goals of care - Family meeting planned for today.  Best practice (evaluated daily):  Diet/type: tubefeeds DVT prophylaxis: not indicated GI prophylaxis: PPI Lines: N/A Foley:  N/A Code Status:  full code Last date of multidisciplinary goals of care discussion: Per primary.  Critical care time:    The patient is critically ill with multiple organ system failure and requires high complexity decision making for assessment and support, frequent evaluation and titration of therapies, advanced monitoring, review of radiographic studies and interpretation of complex data.   Critical Care Time devoted to patient care services, exclusive of separately billable  procedures, described in this note is 35 minutes.   PMarshell GarfinkelMD [facility] See Amion for pager  If no response to pager , please call [phone] until 7pm After 7:00 pm call Elink  [phone]/24/2023, 9:19 AM

## 2022-09-03 NOTE — Progress Notes (Signed)
Pt placed on PSV 5/5 per wean protocol. Pt is tolerating well at this time. RT to continue to monitor.

## 2022-09-03 NOTE — Progress Notes (Signed)
Daily Progress Note   Patient Name: Randy Ross       Date: 09/03/2022 DOB: 1971/08/08  Age: 51 y.o. MRN#: [id number] Attending Physician: Stroke, Md, MD Primary Care Physician: Medicine, [place] Date: 08/24/2022  Reason for Consultation/Follow-up: Establishing goals of care  Subjective: Pt intubated, remains on fentanyl infusion, unable to follow commands. Family present for family meeting - see below.   Length of Stay: 10  Current Medications: Scheduled Meds:   amLODipine  5 mg Per Tube BID   carvedilol  25 mg Per Tube BID WC   Chlorhexidine Gluconate Cloth  6 each Topical Daily   Chlorhexidine Gluconate Cloth  6 each Topical Q0600   darbepoetin (ARANESP) injection - DIALYSIS  150 mcg Subcutaneous Q Sun-1800   feeding supplement (PROSource TF20)  60 mL Per Tube BID   hydrALAZINE  50 mg Per Tube Q8H   insulin aspart  0-15 Units Subcutaneous Q4H   multivitamin  1 tablet Per Tube QHS   nutrition supplement (JUVEN)  1 packet Per Tube BID BM   mouth rinse  15 mL Mouth Rinse Q2H   pantoprazole (PROTONIX) IV  40 mg Intravenous Daily   sevelamer carbonate  2.4 g Per Tube TID WC   sodium zirconium cyclosilicate  10 g Per Tube Daily    Continuous Infusions:  sodium chloride Stopped (08/30/22 0033)   feeding supplement (NEPRO CARB STEADY) 50 mL/hr at 09/03/22 1000   fentaNYL infusion INTRAVENOUS 100 mcg/hr (09/03/22 1000)   thiamine (VITAMIN B1) injection Stopped (09/03/22 0236)    PRN Meds: sodium chloride, acetaminophen (TYLENOL) oral liquid 160 mg/5 mL, bisacodyl, docusate, fentaNYL, hydrALAZINE, labetalol, lidocaine (PF), lidocaine-prilocaine, midazolam, mouth rinse, pentafluoroprop-tetrafluoroeth, polyethylene glycol  Physical Exam Constitutional:       General: He is not in acute distress.    Appearance: He is ill-appearing.     Comments: Nonpurposeful movements  Cardiovascular:     Rate and Rhythm: Normal rate.  Pulmonary:     Effort: Pulmonary effort is normal.  Skin:    General: Skin is warm and dry.             Vital Signs: BP (!) 138/59   Pulse 76   Temp 97.8 F (36.6 C) (Axillary)   Resp 11   Ht '5\' 5"'$  (1.651 m)   Wt 84.6 kg   SpO2 100%   BMI 31.04 kg/m  SpO2: SpO2: 100 % O2 Device: O2 Device: Ventilator O2 Flow Rate:    Intake/output summary:  Intake/Output Summary (Last 24 hours) at 09/03/2022 1015 Last data filed at 09/03/2022 1000 Gross per 24 hour  Intake 1743.53 ml  Output 300 ml  Net 1443.53 ml   LBM: Last BM Date : 09/02/22 Baseline Weight: Weight: 80.4 kg Most recent weight: Weight: 84.6 kg         Patient Active Problem List   Diagnosis Date Noted   Ischemic cerebrovascular accident (CVA) ([name]) 09/01/2022   Pressure injury of skin 09/01/2022   Nontraumatic intracerebral hemorrhage of cerebellum ([place]) 09/01/2022   Acute respiratory failure with hypoxia ([place]) 08/25/2022   Hypertensive emergency 08/25/2022   Hemorrhagic stroke ([name]) 08/25/2022   Encounter for continuous renal replacement therapy (  CRRT) for acute renal failure ([name]) 08/25/2022   Encephalopathy acute 08/24/2022   ESRD (end stage renal disease) ([place]) 12/15/2021   Essential hypertension 12/15/2021   Diabetes mellitus ([place]) 12/15/2021   CKD (chronic kidney disease), stage IV ([place]) 12/08/2021   AKI (acute kidney injury) ([place]) 12/08/2021   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 male  with past medical history of Anemia, ESRD on HD, diabetes, and hypertension admitted on 08/24/2022 with acute IPH with severe mass effect/brain swelling and hypertensive emergency.  Patient also with inability to protect airway due to  mental status.  PMT consulted to discuss goals of care.   Per family patient goes by "Randy Ross"  Assessment: Large family meeting to include mother, brothers, sisters, significant other, friends, niece, nephew, RN, and [place] interpreter.  Dr. Reeves Forth with Neurology and Dr Vaughan Browner with CCM present at meeting as well.  We initiated meeting by reviewing conversation yesterday.  All questions and concerns addressed.  Dr. Reeves Forth provided update about patient's stroke and potential outcomes.  Dr. Vaughan Browner provided update about patient's continued dependence on ventilator.  Discussed options of trach versus eventual one-way extubation.  Together with the family we reviewed option of continuing aggressive medical care which could potentially include trach, PEG, and facility placement versus the option of one-way extubation and focusing on comfort if/when Randy Ross declines.  Multiple opinions expressed during meeting from different family members-no consensus achieved.  Some expressed concern that expected quality of life would not be something Joy would find acceptable.  Other expressed desire of wanting to continue aggressive care to give generally more time.  All questions and concerns addressed.  Family asks why decisions have to be made now and why we cannot wait more time.  We discussed that patient's endotracheal tube is only temporary and that we are getting to a point where if we continue aggressive care will need to be converted to something more permanent such as trach.  We discussed a plan to give the family the weekend to discuss amongst themselves and consider their options and we will follow-up with him Monday.  Recommendations/Plan: PMT provider will follow up with family Monday regarding how to move forward Patient's brother Randy Ross is serving as family spokesperson he can be reached at [phone]  Goals of Care and Additional Recommendations: Limitations on Scope of Treatment: Full Scope  Treatment  Code Status: Full code  Discharge Planning: To Be Determined  Care plan was discussed with RN, patient's family, Dr. Reeves Forth, Dr. Vaughan Browner  Thank you for allowing the Palliative Medicine Team to assist in the care of this patient.   *Please note that this is a verbal dictation therefore any spelling or grammatical errors are due to the "[place] [employer]" system interpretation.  Juel Burrow, DNP, [facility] Palliative Medicine Team Team Phone # [phone]  Pager [phone]

## 2022-09-03 NOTE — Progress Notes (Addendum)
STROKE TEAM PROGRESS NOTE   INTERVAL HISTORY Patient is seen in his room with no family at the bedside.  He is noted to be moving around not purposefully today, but cannot follow commands.  Family meeting at 2:00 today.  Plan for dialysis today.  Vitals:   09/03/22 1130 09/03/22 1156 09/03/22 1200 09/03/22 1300  BP: (!) 126/58  (!) 148/65 (!) 162/65  Pulse: 73 81 75   Resp: 11 (!) 22 13   Temp:   97.6 F (36.4 C)   TempSrc:   Axillary   SpO2: 100% 97% 100%   Weight:      Height:       CBC:  Recent Labs  Lab 09/02/22 0903 09/03/22 0351  WBC 13.1* 16.0*  HGB 8.8* 8.5*  HCT 26.7* 25.9*  MCV 69.9* 69.8*  PLT 353 446*    Basic Metabolic Panel:  Recent Labs  Lab 09/02/22 0903 09/03/22 0351  NA 137 140  K 4.7 4.6  CL 97* 92*  CO2 25 23  GLUCOSE 194* 188*  BUN 119* 162*  CREATININE 7.17* 8.86*  CALCIUM 8.5* 9.8  MG 2.5* 2.7*  PHOS 9.7* 11.1*    Lipid Panel:  Recent Labs  Lab 08/29/22 0450  TRIG 101    HgbA1c:  No results for input(s): "HGBA1C" in the last 168 hours.  Urine Drug Screen: No results for input(s): "LABOPIA", "COCAINSCRNUR", "LABBENZ", "AMPHETMU", "THCU", "LABBARB" in the last 168 hours.  Alcohol Level  No results for input(s): "ETH" in the last 168 hours.   IMAGING past 24 hours CT HEAD WO CONTRAST (5MM)  Result Date: 09/02/2022 CLINICAL DATA:  Stroke, follow up EXAM: CT HEAD WITHOUT CONTRAST TECHNIQUE: Contiguous axial images were obtained from the base of the skull through the vertex without intravenous contrast. RADIATION DOSE REDUCTION: This exam was performed according to the departmental dose-optimization program which includes automated exposure control, adjustment of the mA and/or kV according to patient size and/or use of iterative reconstruction technique. COMPARISON:  MR head 08/31/2022, CT head 08/29/2022 FINDINGS: Brain: Cerebral ventricle sizes are concordant with the degree of cerebral volume loss. Patchy and confluent areas of  decreased attenuation are noted throughout the deep and periventricular white matter of the cerebral hemispheres bilaterally, compatible with chronic microvascular ischemic disease. 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 No evidence of large-territorial acute infarction. No new parenchymal hemorrhage. Grossly stable 2.3 cm hyperdense round masslike lesion within the right cerebellum with associated vasogenic edema. No new mass identified. Possible extension into the fourth ventricle with no associated hydrocephalus. No mass effect or midline shift. No hydrocephalus. Basilar cisterns are patent. Vascular: No hyperdense vessel. Skull: No acute fracture or focal lesion. Sinuses/Orbits: Paranasal sinuses and mastoid air cells are clear. Right lens replacement. Otherwise the orbits are unremarkable. Other: Enteric and endotracheal tubes partially visualized. IMPRESSION: Grossly stable 2.3 cm hyperdense masslike lesion within the right cerebellum with associated vasogenic edema. Possible extension into the fourth ventricle with no associated hydrocephalus. Finding may represent a hemorrhagic mass. Electronically Signed   By: Iven Finn M.D.   On: 09/02/2022 17:03    PHYSICAL EXAM  Temp:  [97.6 F (36.4 C)-98.4 F (36.9 C)] 97.6 F (36.4 C) (11/24 1200) Pulse Rate:  [10-81] 75 (11/24 1200) Resp:  [9-24] 13 (11/24 1200) BP: (112-201)/(46-103) 162/65 (11/24 1300) SpO2:  [97 %-100 %] 100 % (11/24 1200) FiO2 (%):  [40 %] 40 % (11/24 1156) Weight:  [84.6 kg] 84.6 kg (11/24 1042)  General - Well nourished, well developed, intubated on sedation with fentanyl only  Ophthalmologic - fundi not visualized due to noncooperation.  Cardiovascular - Regular rate and rhythm.  Neuro - intubated on sedation, eyes closed, not following commands. Does resist forced eye opening. With forced eye opening, both eye in mid position, not blinking to visual threat, left gaze deviation,  unable to cross midline, PERRL. Cough present. Breathing over the vent.  Facial symmetry not able to test due to ET tube.  Tongue protrusion not cooperative.  Today he was able to localize with bilateral upper extremities to sternal rub.  No withdrawal in the left lower extremity to painful stimuli.   coordination and gait not tested.     ASSESSMENT/PLAN [name] is a 51 y.o. male with history of end-stage renal disease on intermittent hemodialysis, diabetes, hypertension presented with  witnessed onset slurred speech, with headache at 10:30 PM for which EMS was activated.  On their arrival they report blood pressure was 260/140, glucose 126.  He was found to have a right sided cerebellar ICH.  Neurosurgery was consulted for possible EVD placement, but they have no plans for operative intervention at this point.  ICH remains stable on CT scan.  Patient has been receiving IHD.  MRI today shows scattered small strokes in bilateral hemispheres, likely embolic.  ICH: Acute right cerebellar small ICH, likely due to chronic uncontrolled hypertension vs embolic Code Stroke CT head Acute intraparenchymal hematoma centered in the right cerebellar hemisphere with severe mass effect on the fourth ventricle and cerebral aqueduct. No hydrocephalus. CT repeat - hemorrhage size stable from earlier today. There is likely extension into the fourth ventricle but no intraventricular spread or hydrocephalus. CT head and neck premature atherosclerosis with notable vessel tortuosity suggesting chronic hypertension CT repeat 11/16 stable hematoma and no hydrocephalus CT Head 11/17 1. Small acute infarct at the right corpus callosum body and cingulate white matter, not seen on admission head CT. 2. Non progressed right cerebellar hematoma and swelling. No hydrocephalus. MRI brain no change in right cerebellar IPH, surrounding edema without hydrocephalus.  Multiple small infarcts in bilateral hemispheres,  potentially embolic 2D Echo EF 50 to 55% EEG- This study showed generalized periodic discharges with triphasic morphology which is on the ictal-interictal continuum. However, the frequency, morphology and reactivity to stimulation is most likely indicative of toxic-metabolic causes. . Additionally there is moderate to severe diffuse encephalopathy, nonspecific etiology. No seizures were seen throughout the recording.  LDL 91 HgbA1c 5.9 VTE prophylaxis - SCD's No antithrombotic prior to admission, now on No antithrombotic.  Therapy recommendations:  pending Disposition:  pending  Cerebral Edema On 3% saline protocol -> off now due to fluid status Last NA 143->148->148 -> 144, with NA goal 150 Serial NA checks Q4h -> 23.4% PRN to keep Na goal around 150 11/18- 23.4% bolus ordered NSGY following - no surgical intervention at this time  Will allow Na to normalize, no further HTS boluses unless change in exam or worsening of cerebral edema on CT  ESRD on HD HD [facility] Renal Dr. Jonnie Finner following, appreciate help Had HD catheter CRRT stopped 11/17 due to severe anemia -> transition to HD  Pneumonia: Klebseilla, strep and H.flu on per CCM on Ceftiaxone.  Acute hypoxic Respiratory failure  Vent management per CCM  On Fentanyl for sedation  Versed as needed for agitation on vent Not candidate for extubation  Hypertension Home meds:  norvasc 10 mg, coreg 25 mg, imdur 30 mg, losartan 100  mg- on hold  Stable off Cleviprex Resume home norvasc and coreg SBP goal < 160 BP goal normotensive  Hyperlipidemia Home meds:  none LDL 91 goal < 70 Consider statin at discharge  Diabetes type II Controlled Home meds:  Semglee HgbA1c 5.9, goal < 7.0 CBGs SSI  dysphagia  OG tube and cortrak placed [facility] day # 10   Patient seen and examined by NP/APP with MD. MD to update note as needed.   Baker , MSN, AGACNP-BC [facility] See Amion for  schedule and pager information 09/03/2022 1:16 PM   ATTENDING ATTESTATION:   68 with end-stage renal disease now on hemodialysis.  Has a right cerebellar intracranial hemorrhage with multiple embolic scattered strokes likely cardioembolic.  He has severely agitated and is on sedation.  Received' \\dose'$  of fentanyl and exam consistent with a sedation.  Pupils equal round reactive.  He still resists eye opening during examination but will keep eyes open spontaneously.  Completed antibiotic course for his pneumonia managed by CCM.  Repeat CT yesterday is stable with no changes.  Large family meeting along with CCM at 2 p.m. today arranged by palliative care.  Interpreter was used and family's questions were answered.  They understand that if he is trached and continues the need for ventilator [place].  Right now they want to see how he does over the weekend and possibly make a decision on Monday about extubation if there is no significant improvement.  Appreciate CCM and nephrology assistance.   Dr. Reeves Forth evaluated pt independently, reviewed imaging, chart, labs. Discussed and formulated plan with the Resident/APP. Changes were made to the note where appropriate. Please see APP/resident note above for details.     This patient is critically ill due to cerebellar ICH, cerebral edema, respiratory failure, ESRD, hypertensive emergency and at significant risk of neurological worsening, death form hematoma expansion, brain herniation, obstructive hydrocephalus, seizure. This patient's care requires constant monitoring of vital signs, hemodynamics, respiratory and cardiac monitoring, review of multiple databases, neurological assessment, discussion with family, other specialists and medical decision making of high complexity. I spent 35 minutes of neurocritical care time in the care of this patient.    Musette Kisamore,MD     To contact Stroke Continuity provider, please refer to  [email or web address] After hours, contact General Neurology

## 2022-09-03 NOTE — Progress Notes (Signed)
ETT holder changed. No complications noted. °

## 2022-09-04 ENCOUNTER — Inpatient Hospital Stay (HOSPITAL_COMMUNITY): Payer: No Typology Code available for payment source

## 2022-09-04 DIAGNOSIS — I639 Cerebral infarction, unspecified: Secondary | ICD-10-CM

## 2022-09-04 LAB — GLUCOSE, CAPILLARY
Glucose-Capillary: 147 mg/dL — ABNORMAL HIGH (ref 70–99)
Glucose-Capillary: 151 mg/dL — ABNORMAL HIGH (ref 70–99)
Glucose-Capillary: 158 mg/dL — ABNORMAL HIGH (ref 70–99)
Glucose-Capillary: 165 mg/dL — ABNORMAL HIGH (ref 70–99)
Glucose-Capillary: 172 mg/dL — ABNORMAL HIGH (ref 70–99)
Glucose-Capillary: 241 mg/dL — ABNORMAL HIGH (ref 70–99)

## 2022-09-04 LAB — BASIC METABOLIC PANEL
Anion gap: 18 — ABNORMAL HIGH (ref 5–15)
BUN: 114 mg/dL — ABNORMAL HIGH (ref 6–20)
CO2: 27 mmol/L (ref 22–32)
Calcium: 9.7 mg/dL (ref 8.9–10.3)
Chloride: 92 mmol/L — ABNORMAL LOW (ref 98–111)
Creatinine, Ser: 6.67 mg/dL — ABNORMAL HIGH (ref 0.61–1.24)
GFR, Estimated: 9 mL/min — ABNORMAL LOW (ref >60)
Glucose, Bld: 152 mg/dL — ABNORMAL HIGH (ref 70–99)
Potassium: 3.8 mmol/L (ref 3.5–5.1)
Sodium: 137 mmol/L (ref 135–145)

## 2022-09-04 LAB — CBC
HCT: 29.2 % — ABNORMAL LOW (ref 39.0–52.0)
Hemoglobin: 9.3 g/dL — ABNORMAL LOW (ref 13.0–17.0)
MCH: 22.7 pg — ABNORMAL LOW (ref 26.0–34.0)
MCHC: 31.8 g/dL (ref 30.0–36.0)
MCV: 71.4 fL — ABNORMAL LOW (ref 80.0–100.0)
Platelets: 501 10*3/uL — ABNORMAL HIGH (ref 150–400)
RBC: 4.09 MIL/uL — ABNORMAL LOW (ref 4.22–5.81)
RDW: 23.4 % — ABNORMAL HIGH (ref 11.5–15.5)
WBC: 15.9 10*3/uL — ABNORMAL HIGH (ref 4.0–10.5)
nRBC: 0.6 % — ABNORMAL HIGH (ref 0.0–0.2)

## 2022-09-04 LAB — PHOSPHORUS: Phosphorus: 8.3 mg/dL — ABNORMAL HIGH (ref 2.5–4.6)

## 2022-09-04 LAB — MAGNESIUM: Magnesium: 2.1 mg/dL (ref 1.7–2.4)

## 2022-09-04 MED ORDER — CHLORHEXIDINE GLUCONATE CLOTH 2 % EX PADS
6.0000 | MEDICATED_PAD | Freq: Every day | CUTANEOUS | Status: DC
  Administered 2022-09-05: 6 via TOPICAL

## 2022-09-04 MED ORDER — HEPARIN SODIUM (PORCINE) 5000 UNIT/ML IJ SOLN
5000.0000 [IU] | Freq: Three times a day (TID) | INTRAMUSCULAR | Status: DC
  Administered 2022-09-04 – 2022-09-10 (×18): 5000 [IU] via SUBCUTANEOUS
  Filled 2022-09-04 (×18): qty 1

## 2022-09-04 NOTE — Progress Notes (Signed)
Pt completed 4 hour HD treatment w/ no complications. Stable, report to icu rn. Start: 1335 End: 1738 2531m fluid removed 96L BVP No HD meds ordered UTA bed weight d/t equipment, pillows on bed

## 2022-09-04 NOTE — Progress Notes (Signed)
Post hd rn assessment 

## 2022-09-04 NOTE — Progress Notes (Signed)
Bilateral lower extremity venous duplex has been completed. Preliminary results can be found in CV Proc through chart review.  Results were given to Dr. Erlinda Hong.  09/04/22 12:13 PM Randy Ross RVT

## 2022-09-04 NOTE — Progress Notes (Signed)
RT attempted to place pt on wean this morning and pt failed due to apnea. Pt became agitated and RT placed pt back on full support. RT waited until pt settled and tried again to wean pt and pt went apneic.

## 2022-09-04 NOTE — Progress Notes (Addendum)
STROKE TEAM PROGRESS NOTE   ATTENDING NOTE: I reviewed above note and agree with the assessment and plan. Pt was seen and examined.   51 year old male admitted for right cerebellar ICH.  CT 11/23 showed stable ICH with left BG and right corpus callosum infarcts.Marland Kitchen  MRI 11/21 showed bilateral small acute embolic strokes, with chronic right BG and pontine ICH.  LUE middle Doppler no DVT.  Creatinine 8.86, WBC 13.1-6.0.  Hemoglobin 8.8-8.5.  On exam, patient intubated on sedation, eyes closed, not following commands. With forced eye opening, eyes in right gaze position, not blinking to visual threat, doll's eyes absent, not tracking, PERRL. Corneal reflex weakly present bilaterally, gag and cough present. Breathing over the vent.  Facial symmetry not able to test due to ET tube.  Tongue protrusion not cooperative. On pain stimulation, mild withdraw in all extremities but per RN pt moves BUE strongly with coughing and gaging. No babinski bilaterally. Sensation, coordination and gait not tested.   Currently BP stable, on Norvasc 5, Coreg 25 and hydralazine 50 every 8.  Still on fentanyl for agitation with vent.  On tube feeding at 43.  Palliative care on board, family yet to make decision.  For detailed assessment and plan, please refer to above/below as I have made changes wherever appropriate.   Rosalin Hawking, MD PhD Stroke Neurology 09/04/2022 11:07 PM  This patient is critically ill due to cerebellar ICH, cerebral edema, respiratory failure, ESRD, hypertensive emergency and at significant risk of neurological worsening, death form hematoma expansion, brain herniation, obstructive hydrocephalus, seizure. This patient's care requires constant monitoring of vital signs, hemodynamics, respiratory and cardiac monitoring, review of multiple databases, neurological assessment, discussion with family, other specialists and medical decision making of high complexity. I spent 30 minutes of neurocritical care time  in the care of this patient.  I discussed with CCM Dr. Vaughan Browner.     INTERVAL HISTORY Patient is seen in his room with no family at the bedside.  Family meeting was held yesterday, but family was unable to reach a decision at that time.  They will reconvene on Monday with decision regarding goals of care.  Attempts at weaning ventilator were made this morning, but patient went apneic so this was unsuccessful.  He will have an extra dialysis session today.  Vitals:   09/04/22 1000 09/04/22 1100 09/04/22 1200 09/04/22 1242  BP: (!) 151/67 (!) 160/73 (!) 172/73 (!) 153/61  Pulse: 65 68 67 63  Resp: '16 15 15 16  '$ Temp:   98.3 F (36.8 C)   TempSrc:   Axillary   SpO2: 97% 99% 99% 96%  Weight:      Height:       CBC:  Recent Labs  Lab 09/03/22 0351 09/04/22 0419  WBC 16.0* 15.9*  HGB 8.5* 9.3*  HCT 25.9* 29.2*  MCV 69.8* 71.4*  PLT 446* 501*    Basic Metabolic Panel:  Recent Labs  Lab 09/03/22 0351 09/04/22 0419  NA 140 137  K 4.6 3.8  CL 92* 92*  CO2 23 27  GLUCOSE 188* 152*  BUN 162* 114*  CREATININE 8.86* 6.67*  CALCIUM 9.8 9.7  MG 2.7* 2.1  PHOS 11.1* 8.3*    Lipid Panel:  Recent Labs  Lab 08/29/22 0450  TRIG 101    HgbA1c:  No results for input(s): "HGBA1C" in the last 168 hours.  Urine Drug Screen: No results for input(s): "LABOPIA", "COCAINSCRNUR", "LABBENZ", "AMPHETMU", "THCU", "LABBARB" in the last 168 hours.  Alcohol Level  No results for input(s): "ETH" in the last 168 hours.   IMAGING past 24 hours VAS [place] LOWER EXTREMITY VENOUS (DVT)  Result Date: 09/04/2022  Lower Venous DVT Study Patient Name:  Randy Ross  Date of Exam:   09/04/2022 Medical Rec #: [id number]          Accession #:    [id number] Date of Birth: 1971/02/13         Patient Gender: M Patient Age:   89 years Exam Location:  [facility] Procedure:      VAS [place] LOWER EXTREMITY VENOUS (DVT) Referring Phys: Cornelius Moras Torrence Hammack  --------------------------------------------------------------------------------  Indications: Stroke.  Risk Factors: None identified. Comparison Study: No prior studies. Performing Technologist: Oliver Hum RVT  Examination Guidelines: A complete evaluation includes B-mode imaging, spectral Doppler, color Doppler, and power Doppler as needed of all accessible portions of each vessel. Bilateral testing is considered an integral part of a complete examination. Limited examinations for reoccurring indications may be performed as noted. The reflux portion of the exam is performed with the patient in reverse Trendelenburg.  +---------+---------------+---------+-----------+----------+--------------+ RIGHT    CompressibilityPhasicitySpontaneityPropertiesThrombus Aging +---------+---------------+---------+-----------+----------+--------------+ CFV      Full           Yes      Yes                                 +---------+---------------+---------+-----------+----------+--------------+ SFJ      Full                                                        +---------+---------------+---------+-----------+----------+--------------+ FV Prox  Full                                                        +---------+---------------+---------+-----------+----------+--------------+ FV Mid   Full                                                        +---------+---------------+---------+-----------+----------+--------------+ FV DistalFull                                                        +---------+---------------+---------+-----------+----------+--------------+ PFV      Full                                                        +---------+---------------+---------+-----------+----------+--------------+ POP      Full           Yes      Yes                                 +---------+---------------+---------+-----------+----------+--------------+  PTV      Full                                                         +---------+---------------+---------+-----------+----------+--------------+ PERO     Full                                                        +---------+---------------+---------+-----------+----------+--------------+   +---------+---------------+---------+-----------+----------+--------------+ LEFT     CompressibilityPhasicitySpontaneityPropertiesThrombus Aging +---------+---------------+---------+-----------+----------+--------------+ CFV      Full           Yes      Yes                                 +---------+---------------+---------+-----------+----------+--------------+ SFJ      Full                                                        +---------+---------------+---------+-----------+----------+--------------+ FV Prox  Full                                                        +---------+---------------+---------+-----------+----------+--------------+ FV Mid   Full           Yes      Yes                                 +---------+---------------+---------+-----------+----------+--------------+ FV DistalFull                                                        +---------+---------------+---------+-----------+----------+--------------+ PFV      Full                                                        +---------+---------------+---------+-----------+----------+--------------+ POP      Full           Yes      Yes                                 +---------+---------------+---------+-----------+----------+--------------+ PTV      Full                                                        +---------+---------------+---------+-----------+----------+--------------+  PERO     Full                                                        +---------+---------------+---------+-----------+----------+--------------+    Summary: RIGHT: - There is no evidence of deep vein thrombosis in the  lower extremity.  - No cystic structure found in the popliteal fossa.  LEFT: - There is no evidence of deep vein thrombosis in the lower extremity.  - No cystic structure found in the popliteal fossa.  *See table(s) above for measurements and observations.    Preliminary     PHYSICAL EXAM  Temp:  [97.9 F (36.6 C)-98.7 F (37.1 C)] 98.3 F (36.8 C) (11/25 1200) Pulse Rate:  [63-79] 63 (11/25 1242) Resp:  [8-22] 16 (11/25 1242) BP: (125-183)/(59-79) 153/61 (11/25 1242) SpO2:  [95 %-100 %] 96 % (11/25 1242) FiO2 (%):  [40 %] 40 % (11/25 0840) Weight:  [82.2 kg] 82.2 kg (11/24 1446)  General - Well nourished, well developed, intubated on sedation with fentanyl only  Ophthalmologic - fundi not visualized due to noncooperation.  Cardiovascular - Regular rate and rhythm.  Neuro - intubated on sedation, eyes closed, not following commands. Does resist forced eye opening. With forced eye opening, both eye in mid position, not blinking to visual threat, right gaze deviation, unable to cross midline, weak corneal reflexes, PERRL. Cough present. Breathing over the vent.  Facial symmetry not able to test due to ET tube.  Tongue protrusion not cooperative.  Moves all extremities spontaneously and not purposefully.    ASSESSMENT/PLAN Randy Ross is a 51 y.o. male with history of end-stage renal disease on intermittent hemodialysis, diabetes, hypertension presented with  witnessed onset slurred speech, with headache at 10:30 PM for which EMS was activated.  On their arrival they report blood pressure was 260/140, glucose 126.  He was found to have a right sided cerebellar ICH.  Neurosurgery was consulted for possible EVD placement, but they have no plans for operative intervention at this point.  ICH remains stable on CT scan.  Patient has been receiving IHD.  MRI shows scattered small strokes in bilateral hemispheres, likely embolic.  Goals of care discussion with family is ongoing.  ICH:  Acute right cerebellar small ICH, likely due to chronic uncontrolled hypertension vs embolic Code Stroke CT head Acute intraparenchymal hematoma centered in the right cerebellar hemisphere with severe mass effect on the fourth ventricle and cerebral aqueduct. No hydrocephalus. CT repeat - hemorrhage size stable from earlier today. There is likely extension into the fourth ventricle but no intraventricular spread or hydrocephalus. CT head and neck premature atherosclerosis with notable vessel tortuosity suggesting chronic hypertension CT repeat 11/16 stable hematoma and no hydrocephalus CT Head 11/17 1. Small acute infarct at the right corpus callosum body and cingulate white matter, not seen on admission head CT. 2. Non progressed right cerebellar hematoma and swelling. No hydrocephalus. MRI brain no change in right cerebellar IPH, surrounding edema without hydrocephalus.  Multiple small infarcts in bilateral hemispheres, potentially embolic 2D Echo EF 50 to 55% EEG- This study showed generalized periodic discharges with triphasic morphology which is on the ictal-interictal continuum. However, the frequency, morphology and reactivity to stimulation is most likely indicative of toxic-metabolic causes. . Additionally there is moderate to severe diffuse encephalopathy, nonspecific etiology. No seizures were  seen throughout the recording.  LDL 91 HgbA1c 5.9 VTE prophylaxis - SCD's No antithrombotic prior to admission, now on No antithrombotic.  Therapy recommendations:  pending Disposition:  pending  Cerebral Edema On 3% saline protocol -> off now due to fluid status Last NA 143->148->148 -> 144, with NA goal 150 Serial NA checks Q4h -> 23.4% PRN to keep Na goal around 150 11/18- 23.4% bolus ordered NSGY following - no surgical intervention at this time  Will allow Na to normalize, no further HTS boluses unless change in exam or worsening of cerebral edema on CT  ESRD on HD HD [facility] Renal Dr.  Jonnie Finner following, appreciate help Had HD catheter CRRT stopped 11/17 due to severe anemia -> transition to HD  Pneumonia: Klebseilla, strep and H.flu on per CCM on Ceftiaxone.  Acute hypoxic Respiratory failure  Vent management per CCM  On Fentanyl for sedation  Versed as needed for agitation on vent Not candidate for extubation  Hypertension Home meds:  norvasc 10 mg, coreg 25 mg, imdur 30 mg, losartan 100 mg- on hold  Stable off Cleviprex Resume home norvasc and coreg SBP goal < 160 BP goal normotensive  Hyperlipidemia Home meds:  none LDL 91 goal < 70 Consider statin at discharge  Diabetes type II Controlled Home meds:  Semglee HgbA1c 5.9, goal < 7.0 CBGs SSI  dysphagia  OG tube and cortrak placed [facility] day # 11   Patient seen and examined by NP/APP with MD. MD to update note as needed.   [place] , MSN, AGACNP-BC [facility] See Amion for schedule and pager information 09/04/2022 12:53 PM    To contact Stroke Continuity provider, please refer to [email or web address] After hours, contact General Neurology

## 2022-09-04 NOTE — Progress Notes (Signed)
Pre hd rn assessment 

## 2022-09-04 NOTE — Progress Notes (Signed)
Randy Ross, MRN:  [id number], DOB:  1970/12/21, LOS: 72 ADMISSION DATE:  08/24/2022, CONSULTATION DATE:  08/25/2022 REFERRING MD:  [place] - Neuro CHIEF COMPLAINT:  AMS   History of Present Illness:  51 year old man who presented to [facility] ED 11/14 with left sided visual preference, left sided weakness, slurred speech. He was also profoundly hypertensive with SBP over 200s (highest documented is SBP 230). PMHx significant for HTN, T2DM, ESRD on HD (MWF).  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 He was intubated in the ED and neurosurgery was consulted for consideration of EVD.  He was admitted by neurology and PCCM was called in consultation for vent management.  Pertinent Medical History:   Past Medical History:  Diagnosis Date   Anemia    CKD (chronic kidney disease)    on wed 12/09/21   Diabetes mellitus without complication ([place])    type 2   Hypertension    Renal disorder    Significant Hospital Events: Including procedures, antibiotic start and stop dates in addition to other pertinent events   11/15 - Admitted overnight. 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  CTA Head/Neck without e/o vascular lesion at R cerebellar hemorrhage. HTS 3% initiated. Trialysis catheter placed L IJ for CRRT/HTS 23% (per Stroke team). CRRT initiated. 11/16 Low grade temp with Tmax 99.4, WBC uptrending 25.9 (9.0). CXR with increased RLL opacities. Cefepime initiated. Resp Cx/TA > mult spec h.flu, strep species, klebsiella species. TG elevated, weaning prop. 11/17 Ongoing low grade temps Tmax 100.16F. WBC downtrending 19.3 (25.9). CRRT filter clotting causing blood loss, Hgb 6.3. CRRT stopped, 1U PRBCs transfused. HD to resume. Repeat CT Head with small acute infarct at R corpus  callosum body/cingulate white matter, stable R cerebellar hematoma. CXR improved on cefepime. 11/19 stirs to touch 11/24 meeting with palliative care, neurology and [facility]  Family still discussing options among themselves.  Interim History / Subjective:   Failed pressure support weans.  Remains on the ventilator.  Objective:  Blood pressure (!) 142/62, pulse 64, temperature 98.6 F (37 C), temperature source Axillary, resp. rate 16, height '5\' 5"'$  (1.651 m), weight 82.2 kg, SpO2 97 %.    Vent Mode: PRVC FiO2 (%):  [40 %] 40 % Set Rate:  [16 bmp] 16 bmp Vt Set:  [490 mL] 490 mL PEEP:  [5 cmH20] 5 cmH20 Pressure Support:  [5 cmH20] 5 cmH20 Plateau Pressure:  [15 cmH20-16 cmH20] 15 cmH20   Intake/Output Summary (Last 24 hours) at 09/04/2022 0943 Last data filed at 09/04/2022 0700 Gross per 24 hour  Intake 1802.15 ml  Output 2400 ml  Net -597.85 ml   Filed Weights   09/01/22 1142 09/03/22 1042 09/03/22 1446  Weight: 84.6 kg 84.6 kg 82.2 kg   Physical Examination: Gen:      No acute distress HEENT:  EOMI, sclera anicteric Neck:     No masses; no thyromegaly Lungs:    Clear to auscultation bilaterally; normal respiratory effort CV:         Regular rate and rhythm; no murmurs Abd:      + bowel sounds; soft, non-tender; no palpable masses, no distension Ext:    No edema; adequate peripheral perfusion Skin:      Warm and dry; no rash Neuro: Sedated  Labs/imaging  reviewed Significant for BUN/creatinine 114/6.67, WBC 15.9 No new imaging  Labs/imaging personally reviewed:  CT head 11/4 > 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 Hypertensive emergency - hx of HTN, unsure of compliance with med regimen Neurology thinking encephalopathy is out of proportion to [place] - MRI brain 11/21  - Continue frequent neurochecks - Continue Coreg, amlodipine, hydralazine - Stroke service primary - AC/VTE ppx per primary - Frequent neuro checks.  - D/c losartan 11/22 given hyperkalemia. - PRN hydralazine, labetalol.  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  Temporary HD cath has been removed. - Will need AV fistulogram for difficulties during dialysis.   Anemia 2/2 CKD and acute blood loss associated with CRRT, now on HD - Follow CBC, transfuse for hemoglobin less than 7  DM2 with hyperglycemia - SSI coverage  At risk for malnutrition - Continue tube feeds  Goals of care - Family meeting on 11/24.  There is no consensus on the plan forward order tracheostomy.  Family wants the weekend to talk amongst themselves and decide.  Best practice (evaluated daily):   Diet/type: tubefeeds DVT prophylaxis: not indicated GI prophylaxis: PPI Lines: N/A Foley:  N/A Code Status:  full code Last date of multidisciplinary goals of care discussion: Per primary.  Critical care time:   The patient is critically ill with multiple organ system failure and requires high complexity decision making for assessment and support, frequent evaluation and titration of therapies, advanced monitoring, review of radiographic  studies and interpretation of complex data.   Critical Care Time devoted to patient care services, exclusive of separately billable procedures, described in this note is 35 minutes.   PMarshell GarfinkelMD [facility] See Amion for pager  If no response to pager , please call [phone] until 7pm After 7:00 pm call Elink  [phone] 09/04/2022, 9:43 AM

## 2022-09-04 NOTE — Progress Notes (Signed)
Pt HD start w/ no complications. Pt restless on vent, icu rn present to assist. Pt has bilateral wrist restraints and left mitt restraint. Vitals stable, no issues w/ cannulation, safety maintained, access visible, will continue to monitor.

## 2022-09-04 NOTE — Progress Notes (Signed)
[facility] Progress Note   Subjective:   IR unable to complete fistulogram yesterday, plan to reeval early next week. BUN 162 -> 114, will have extra HD today. Pt eyes closed, not responding to voice.   Objective Vitals:   09/04/22 0800 09/04/22 0840 09/04/22 0846 09/04/22 0900  BP: (!) 181/69  (!) 160/71 (!) 142/62  Pulse: 71 64 65 64  Resp: '15 16 16 16  '$ Temp: 98.6 F (37 C)     TempSrc: Axillary     SpO2: 100% 97% 98% 97%  Weight:      Height:       Physical Exam General: Orally intubated, NG tube. Sleeping, not responding to voice Heart: RRR, no murmurs, rubs or gallops Lungs: CTA anteriorly, on vent Abdomen: Soft, non-distended, +BS Extremities: trace edema b/l lower extremties Dialysis Access:  LUE AVF + bruit  Additional Objective Labs: Basic Metabolic Panel: Recent Labs  Lab 09/02/22 0903 09/03/22 0351 09/04/22 0419  NA 137 140 137  K 4.7 4.6 3.8  CL 97* 92* 92*  CO2 '25 23 27  '$ GLUCOSE 194* 188* 152*  BUN 119* 162* 114*  CREATININE 7.17* 8.86* 6.67*  CALCIUM 8.5* 9.8 9.7  PHOS 9.7* 11.1* 8.3*   Liver Function Tests: Recent Labs  Lab 08/31/22 1009  AST 19  ALT 18  ALKPHOS 84  BILITOT 0.2*  PROT 6.3*  ALBUMIN 1.9*   No results for input(s): "LIPASE", "AMYLASE" in the last 168 hours. CBC: Recent Labs  Lab 08/31/22 0842 09/01/22 0538 09/02/22 0903 09/03/22 0351 09/04/22 0419  WBC 9.8 11.8* 13.1* 16.0* 15.9*  HGB 7.7* 8.5* 8.8* 8.5* 9.3*  HCT 24.7* 26.2* 26.7* 25.9* 29.2*  MCV 72.9* 70.4* 69.9* 69.8* 71.4*  PLT 214 292 353 446* 501*   Blood Culture    Component Value Date/Time   SDES TRACHEAL ASPIRATE 08/26/2022 1212   SPECREQUEST NONE 08/26/2022 1212   CULT  08/26/2022 1212    ABUNDANT STREPTOCOCCUS ANGINOSIS RARE KLEBSIELLA OXYTOCA ABUNDANT HAEMOPHILUS INFLUENZAE BETA LACTAMASE NEGATIVE Performed at [facility], [place] [address], [place], [place] [address]    REPTSTATUS 08/29/2022 FINAL 08/26/2022 1212     Cardiac Enzymes: No results for input(s): "CKTOTAL", "CKMB", "CKMBINDEX", "TROPONINI" in the last 168 hours. CBG: Recent Labs  Lab 09/03/22 1539 09/03/22 1943 09/03/22 2338 09/04/22 0355 09/04/22 0818  GLUCAP 202* 224* 142* 151* 147*   Iron Studies: No results for input(s): "IRON", "TIBC", "TRANSFERRIN", "FERRITIN" in the last 72 hours. '@lablastinr3'$ @ Studies/Results: CT HEAD WO CONTRAST (5MM)  Result Date: 09/02/2022 CLINICAL DATA:  Stroke, follow up EXAM: CT HEAD WITHOUT CONTRAST TECHNIQUE: Contiguous axial images were obtained from the base of the skull through the vertex without intravenous contrast. RADIATION DOSE REDUCTION: This exam was performed according to the departmental dose-optimization program which includes automated exposure control, adjustment of the mA and/or kV according to patient size and/or use of iterative reconstruction technique. COMPARISON:  MR head 08/31/2022, CT head 08/29/2022 FINDINGS: Brain: Cerebral ventricle sizes are concordant with the degree of cerebral volume loss. Patchy and confluent areas of decreased attenuation are noted throughout the deep and periventricular white matter of the cerebral hemispheres bilaterally, compatible with chronic microvascular ischemic disease. 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 No evidence of large-territorial acute infarction. No new parenchymal hemorrhage. Grossly stable 2.3 cm hyperdense round masslike lesion within the right cerebellum with associated vasogenic edema. No new mass identified. Possible extension into the fourth ventricle with no associated hydrocephalus. No  mass effect or midline shift. No hydrocephalus. Basilar cisterns are patent. Vascular: No hyperdense vessel. Skull: No acute fracture or focal lesion. Sinuses/Orbits: Paranasal sinuses and mastoid air cells are clear. Right lens replacement. Otherwise the orbits are unremarkable. Other: Enteric and endotracheal tubes  partially visualized. IMPRESSION: Grossly stable 2.3 cm hyperdense masslike lesion within the right cerebellum with associated vasogenic edema. Possible extension into the fourth ventricle with no associated hydrocephalus. Finding may represent a hemorrhagic mass. Electronically Signed   By: Iven Finn M.D.   On: 09/02/2022 17:03   Medications:  sodium chloride Stopped (08/30/22 0033)   feeding supplement (NEPRO CARB STEADY) 1,000 mL (09/04/22 0957)   fentaNYL infusion INTRAVENOUS 350 mcg/hr (09/04/22 0700)    amLODipine  5 mg Per Tube BID   carvedilol  25 mg Per Tube BID WC   Chlorhexidine Gluconate Cloth  6 each Topical Daily   Chlorhexidine Gluconate Cloth  6 each Topical Q0600   Chlorhexidine Gluconate Cloth  6 each Topical Q0600   darbepoetin (ARANESP) injection - DIALYSIS  150 mcg Subcutaneous Q Sun-1800   feeding supplement (PROSource TF20)  60 mL Per Tube BID   hydrALAZINE  50 mg Per Tube Q8H   insulin aspart  0-15 Units Subcutaneous Q4H   multivitamin  1 tablet Per Tube QHS   nutrition supplement (JUVEN)  1 packet Per Tube BID BM   mouth rinse  15 mL Mouth Rinse Q2H   pantoprazole (PROTONIX) IV  40 mg Intravenous Daily   sevelamer carbonate  2.4 g Per Tube TID WC   sodium zirconium cyclosilicate  10 g Per Tube Daily    Dialysis Orders: SW MWF 3h 62mn  80kg   400/800   2/2 bath  Hep 2000  LFA AVF - last HD 11/13, post 80.1kg - mircera 150 q2, last 11/13 - hectorol 481m IV q HD    Assessment/Plan: Cerebellar IC hemorrhage/cerebral edema. Uncontrolled HTN felt to be cause. Goal Na was 148-152 at first, now allowing to normalize per neuro note -  Na 137 today.   Uncont HTN - getting coreg/amlodipine per tube. closer to dry weight, will continue to titrate down volume as tolerated Hyperkalemia - recurrent issue, TF's changed to Nepro on 11/20. On daily lokelma. K+ 3.8 and getting dialysis again today, hold lokelma for now and will reeval K+ in the morning.  ESRD: Usual  MWF schedule, required CRRT 11/15-11/17/23. HD 11/20 d/t hyperkalemia. Next HD today. 11/24. BUN very high, suspect access malfunction. Longer Hd ordered yesterday, extra dialysis ordered for today.  and IR consulted for fistulogram.  Resp Failure: On Vent, per ICU teams. Anemia of ESRD: Hgb 9.3 today, continue Aranesp 15039mweekly while here. Secondary HPTH: CCa ok. Phos 8.3, started renvela per NG tube. Continue VDRA.  DM2 - per pmd   SamAnice PaganiniA-C 09/04/2022, 9:58 AM  [facility] Pager: [phone]

## 2022-09-05 LAB — GLUCOSE, CAPILLARY
Glucose-Capillary: 117 mg/dL — ABNORMAL HIGH (ref 70–99)
Glucose-Capillary: 123 mg/dL — ABNORMAL HIGH (ref 70–99)
Glucose-Capillary: 152 mg/dL — ABNORMAL HIGH (ref 70–99)
Glucose-Capillary: 154 mg/dL — ABNORMAL HIGH (ref 70–99)
Glucose-Capillary: 176 mg/dL — ABNORMAL HIGH (ref 70–99)
Glucose-Capillary: 196 mg/dL — ABNORMAL HIGH (ref 70–99)

## 2022-09-05 LAB — RENAL FUNCTION PANEL
Albumin: 2.5 g/dL — ABNORMAL LOW (ref 3.5–5.0)
Anion gap: 18 — ABNORMAL HIGH (ref 5–15)
BUN: 103 mg/dL — ABNORMAL HIGH (ref 6–20)
CO2: 25 mmol/L (ref 22–32)
Calcium: 9.3 mg/dL (ref 8.9–10.3)
Chloride: 88 mmol/L — ABNORMAL LOW (ref 98–111)
Creatinine, Ser: 5.61 mg/dL — ABNORMAL HIGH (ref 0.61–1.24)
GFR, Estimated: 12 mL/min — ABNORMAL LOW (ref >60)
Glucose, Bld: 162 mg/dL — ABNORMAL HIGH (ref 70–99)
Phosphorus: 7.2 mg/dL — ABNORMAL HIGH (ref 2.5–4.6)
Potassium: 4.7 mmol/L (ref 3.5–5.1)
Sodium: 131 mmol/L — ABNORMAL LOW (ref 135–145)

## 2022-09-05 LAB — CBC
HCT: 28.3 % — ABNORMAL LOW (ref 39.0–52.0)
Hemoglobin: 9.2 g/dL — ABNORMAL LOW (ref 13.0–17.0)
MCH: 23.1 pg — ABNORMAL LOW (ref 26.0–34.0)
MCHC: 32.5 g/dL (ref 30.0–36.0)
MCV: 70.9 fL — ABNORMAL LOW (ref 80.0–100.0)
Platelets: 543 10*3/uL — ABNORMAL HIGH (ref 150–400)
RBC: 3.99 MIL/uL — ABNORMAL LOW (ref 4.22–5.81)
RDW: 23.5 % — ABNORMAL HIGH (ref 11.5–15.5)
WBC: 19.5 10*3/uL — ABNORMAL HIGH (ref 4.0–10.5)
nRBC: 0.4 % — ABNORMAL HIGH (ref 0.0–0.2)

## 2022-09-05 LAB — MAGNESIUM: Magnesium: 1.9 mg/dL (ref 1.7–2.4)

## 2022-09-05 MED ORDER — CHLORHEXIDINE GLUCONATE CLOTH 2 % EX PADS
6.0000 | MEDICATED_PAD | Freq: Every day | CUTANEOUS | Status: DC
  Administered 2022-09-05 – 2022-09-07 (×2): 6 via TOPICAL

## 2022-09-05 MED ORDER — ONDANSETRON HCL 4 MG/2ML IJ SOLN
4.0000 mg | Freq: Four times a day (QID) | INTRAMUSCULAR | Status: DC | PRN
  Administered 2022-09-05 – 2022-10-09 (×3): 4 mg via INTRAVENOUS
  Filled 2022-09-05 (×2): qty 2

## 2022-09-05 NOTE — Progress Notes (Signed)
[facility] Progress Note   Subjective:   Tolerated HD without incident yesterday. AM labs still pending. Pt opens eyes to voice, otherwise not following commands.   Objective Vitals:   09/05/22 0430 09/05/22 0600 09/05/22 0800 09/05/22 0912  BP: (!) 141/62 129/62 (!) 140/60 131/65  Pulse: 73 65 71 66  Resp: '15 17 16 19  '$ Temp:   98.5 F (36.9 C)   TempSrc:   Axillary   SpO2: 100% 100% 99% 100%  Weight:      Height:       Physical Exam General: Orally intubated, NG tube. Sleeping, not responding to voice Heart: RRR, no murmurs, rubs or gallops Lungs: CTA anteriorly, on vent Abdomen: Soft, non-distended, +BS Extremities: no edema b/l lower extremties Dialysis Access:  LUE AVF + bruit    Additional Objective Labs: Basic Metabolic Panel: Recent Labs  Lab 09/02/22 0903 09/03/22 0351 09/04/22 0419  NA 137 140 137  K 4.7 4.6 3.8  CL 97* 92* 92*  CO2 '25 23 27  '$ GLUCOSE 194* 188* 152*  BUN 119* 162* 114*  CREATININE 7.17* 8.86* 6.67*  CALCIUM 8.5* 9.8 9.7  PHOS 9.7* 11.1* 8.3*   Liver Function Tests: Recent Labs  Lab 08/31/22 1009  AST 19  ALT 18  ALKPHOS 84  BILITOT 0.2*  PROT 6.3*  ALBUMIN 1.9*   No results for input(s): "LIPASE", "AMYLASE" in the last 168 hours. CBC: Recent Labs  Lab 08/31/22 0842 09/01/22 0538 09/02/22 0903 09/03/22 0351 09/04/22 0419  WBC 9.8 11.8* 13.1* 16.0* 15.9*  HGB 7.7* 8.5* 8.8* 8.5* 9.3*  HCT 24.7* 26.2* 26.7* 25.9* 29.2*  MCV 72.9* 70.4* 69.9* 69.8* 71.4*  PLT 214 292 353 446* 501*   Blood Culture    Component Value Date/Time   SDES TRACHEAL ASPIRATE 08/26/2022 1212   SPECREQUEST NONE 08/26/2022 1212   CULT  08/26/2022 1212    ABUNDANT STREPTOCOCCUS ANGINOSIS RARE KLEBSIELLA OXYTOCA ABUNDANT HAEMOPHILUS INFLUENZAE BETA LACTAMASE NEGATIVE Performed at [facility], [place] [address], [place], [place] [address]    REPTSTATUS 08/29/2022 FINAL 08/26/2022 1212    Cardiac Enzymes: No results for  input(s): "CKTOTAL", "CKMB", "CKMBINDEX", "TROPONINI" in the last 168 hours. CBG: Recent Labs  Lab 09/04/22 1600 09/04/22 2003 09/04/22 2340 09/05/22 0408 09/05/22 0804  GLUCAP 172* 241* 165* 176* 196*   Iron Studies: No results for input(s): "IRON", "TIBC", "TRANSFERRIN", "FERRITIN" in the last 72 hours. '@lablastinr3'$ @ Studies/Results: VAS [place] LOWER EXTREMITY VENOUS (DVT)  Result Date: 09/04/2022  Lower Venous DVT Study Patient Name:  Randy Ross  Date of Exam:   09/04/2022 Medical Rec #: [id number]          Accession #:    [id number] Date of Birth: 1970/11/08         Patient Gender: M Patient Age:   51 years Exam Location:  [facility] Procedure:      VAS [place] LOWER EXTREMITY VENOUS (DVT) Referring Phys: Cornelius Moras XU --------------------------------------------------------------------------------  Indications: Stroke.  Risk Factors: None identified. Comparison Study: No prior studies. Performing Technologist: Oliver Hum RVT  Examination Guidelines: A complete evaluation includes B-mode imaging, spectral Doppler, color Doppler, and power Doppler as needed of all accessible portions of each vessel. Bilateral testing is considered an integral part of a complete examination. Limited examinations for reoccurring indications may be performed as noted. The reflux portion of the exam is performed with the patient in reverse Trendelenburg.  +---------+---------------+---------+-----------+----------+--------------+ RIGHT    CompressibilityPhasicitySpontaneityPropertiesThrombus Aging +---------+---------------+---------+-----------+----------+--------------+ CFV  Full           Yes      Yes                                 +---------+---------------+---------+-----------+----------+--------------+ SFJ      Full                                                        +---------+---------------+---------+-----------+----------+--------------+ FV Prox  Full                                                         +---------+---------------+---------+-----------+----------+--------------+ FV Mid   Full                                                        +---------+---------------+---------+-----------+----------+--------------+ FV DistalFull                                                        +---------+---------------+---------+-----------+----------+--------------+ PFV      Full                                                        +---------+---------------+---------+-----------+----------+--------------+ POP      Full           Yes      Yes                                 +---------+---------------+---------+-----------+----------+--------------+ PTV      Full                                                        +---------+---------------+---------+-----------+----------+--------------+ PERO     Full                                                        +---------+---------------+---------+-----------+----------+--------------+   +---------+---------------+---------+-----------+----------+--------------+ LEFT     CompressibilityPhasicitySpontaneityPropertiesThrombus Aging +---------+---------------+---------+-----------+----------+--------------+ CFV      Full           Yes      Yes                                 +---------+---------------+---------+-----------+----------+--------------+  SFJ      Full                                                        +---------+---------------+---------+-----------+----------+--------------+ FV Prox  Full                                                        +---------+---------------+---------+-----------+----------+--------------+ FV Mid   Full           Yes      Yes                                 +---------+---------------+---------+-----------+----------+--------------+ FV DistalFull                                                         +---------+---------------+---------+-----------+----------+--------------+ PFV      Full                                                        +---------+---------------+---------+-----------+----------+--------------+ POP      Full           Yes      Yes                                 +---------+---------------+---------+-----------+----------+--------------+ PTV      Full                                                        +---------+---------------+---------+-----------+----------+--------------+ PERO     Full                                                        +---------+---------------+---------+-----------+----------+--------------+     Summary: RIGHT: - There is no evidence of deep vein thrombosis in the lower extremity.  - No cystic structure found in the popliteal fossa.  LEFT: - There is no evidence of deep vein thrombosis in the lower extremity.  - No cystic structure found in the popliteal fossa.  *See table(s) above for measurements and observations. Electronically signed by Harold Barban MD on 09/04/2022 at 8:50:02 PM.    Final    Medications:  sodium chloride Stopped (08/30/22 0033)   feeding supplement (NEPRO CARB STEADY) 1,000 mL (09/05/22 0922)   fentaNYL infusion INTRAVENOUS 300 mcg/hr (09/05/22 0743)    amLODipine  5 mg Per Tube BID  carvedilol  25 mg Per Tube BID WC   Chlorhexidine Gluconate Cloth  6 each Topical Daily   Chlorhexidine Gluconate Cloth  6 each Topical Q0600   Chlorhexidine Gluconate Cloth  6 each Topical Q0600   darbepoetin (ARANESP) injection - DIALYSIS  150 mcg Subcutaneous Q Sun-1800   feeding supplement (PROSource TF20)  60 mL Per Tube BID   heparin injection (subcutaneous)  5,000 Units Subcutaneous Q8H   hydrALAZINE  50 mg Per Tube Q8H   insulin aspart  0-15 Units Subcutaneous Q4H   multivitamin  1 tablet Per Tube QHS   nutrition supplement (JUVEN)  1 packet Per Tube BID BM   mouth rinse  15 mL Mouth Rinse Q2H    pantoprazole (PROTONIX) IV  40 mg Intravenous Daily   sevelamer carbonate  2.4 g Per Tube TID WC    Dialysis Orders: SW MWF 3h 65mn  80kg   400/800   2/2 bath  Hep 2000  LFA AVF - last HD 11/13, post 80.1kg - mircera 150 q2, last 11/13 - hectorol 456m IV q HD  Assessment/Plan: Cerebellar IC hemorrhage/cerebral edema. Uncontrolled HTN felt to be cause. Goal Na was 148-152 at first, now allowing to normalize per neuro note -  Na 137.   Uncont HTN - getting coreg/amlodipine per tube. closer to dry weight, will continue to titrate down volume as tolerated Hyperkalemia - recurrent issue, TF's changed to Nepro on 11/20. On daily lokelma. K+ 3.8 before dialysis yesterday, holding lokelma for now.  ESRD: Usual MWF schedule, required CRRT 11/15-11/17/23. HD 11/20 d/t hyperkalemia. Next HD today. 11/24. BUN very high, suspect access malfunction. Longer Hd ordered yFriday extra dialysis Saturday.  and IR consulted for fistulogram.  Resp Failure: On Vent, per ICU teams. Anemia of ESRD: Hgb 9.3, continue Aranesp 1503mweekly while here. Secondary HPTH: CCa ok. Phos 8.3, started renvela per NG tube. Continue VDRA.  DM2 - per pmd   SamAnice PaganiniA-C 09/05/2022, 9:46 AM  [facility] Pager: [phone]

## 2022-09-05 NOTE — Progress Notes (Signed)
NAMEBrian Ross, MRN:  [id number], DOB:  1970/11/20, LOS: 12 ADMISSION DATE:  08/24/2022, CONSULTATION DATE:  08/25/2022 REFERRING MD:  [place] - Neuro CHIEF COMPLAINT:  AMS   History of Present Illness:  51 year old man who presented to [facility] ED 11/14 with left sided visual preference, left sided weakness, slurred speech. He was also profoundly hypertensive with SBP over 200s (highest documented is SBP 230). PMHx significant for HTN, T2DM, ESRD on HD (MWF).  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 He was intubated in the ED and neurosurgery was consulted for consideration of EVD.  He was admitted by neurology and PCCM was called in consultation for vent management.  Pertinent Medical History:   Past Medical History:  Diagnosis Date   Anemia    CKD (chronic kidney disease)    on wed 12/09/21   Diabetes mellitus without complication ([place])    type 2   Hypertension    Renal disorder    Significant Hospital Events: Including procedures, antibiotic start and stop dates in addition to other pertinent events   11/15 - Admitted overnight. 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  CTA Head/Neck without e/o vascular lesion at R cerebellar hemorrhage. HTS 3% initiated. Trialysis catheter placed L IJ for CRRT/HTS 23% (per Stroke team). CRRT initiated. 11/16 Low grade temp with Tmax 99.4, WBC uptrending 25.9 (9.0). CXR with increased RLL opacities. Cefepime initiated. Resp Cx/TA > mult spec h.flu, strep species, klebsiella species. TG elevated, weaning prop. 11/17 Ongoing low grade temps Tmax 100.24F. WBC downtrending 19.3 (25.9). CRRT filter clotting causing blood loss, Hgb 6.3. CRRT stopped, 1U PRBCs transfused. HD to resume. Repeat CT Head with small acute infarct at R corpus  callosum body/cingulate white matter, stable R cerebellar hematoma. CXR improved on cefepime. 11/19 stirs to touch 11/24 meeting with palliative care, neurology and [facility].  Family still discussing options among themselves.  Interim History / Subjective:   No acute events.  Remains on the ventilator. Mental status is unchanged  Objective:  Blood pressure (!) 140/60, pulse 71, temperature 98.5 F (36.9 C), temperature source Axillary, resp. rate 16, height '5\' 5"'$  (1.651 m), weight 82.2 kg, SpO2 99 %.    Vent Mode: PRVC FiO2 (%):  [40 %] 40 % Set Rate:  [16 bmp] 16 bmp Vt Set:  [490 mL] 490 mL PEEP:  [5 cmH20] 5 cmH20 Plateau Pressure:  [15 cmH20-17 cmH20] 15 cmH20   Intake/Output Summary (Last 24 hours) at 09/05/2022 0837 Last data filed at 09/05/2022 0600 Gross per 24 hour  Intake 1774.96 ml  Output 2500 ml  Net -725.04 ml   Filed Weights   09/01/22 1142 09/03/22 1042 09/03/22 1446  Weight: 84.6 kg 84.6 kg 82.2 kg   Physical Examination: Blood pressure (!) 140/60, pulse 71, temperature 98.5 F (36.9 C), temperature source Axillary, resp. rate 16, height '5\' 5"'$  (1.651 m), weight 82.2 kg, SpO2 99 %. Gen:      No acute distress HEENT:  EOMI, sclera anicteric, ET tube Neck:     No masses; no thyromegaly Lungs:    Clear to auscultation bilaterally; normal respiratory effort CV:         Regular rate and rhythm; no murmurs Abd:      + bowel sounds; soft, non-tender; no palpable masses,  no distension Ext:    No edema; adequate peripheral perfusion Skin:      Warm and dry; no rash Neuro: Eyes open, does not follow commands.  Pupillary reflex present  Labs/imaging reviewed No new labs or imaging  Labs/imaging personally reviewed:  CT head 11/4 > 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 CTA head 11/14 > no e/o vascular lesion at R cerebellar hemorrhage MRI brain 11/21 > no  significant changes, many small acute infarcts  Assessment & Plan:   Acute IPH with severe mass effect/brain swelling - presumed hypertensive in nature Repeat CT Head with small acute infarct at R corpus callosum body/cingulate white matter, stable R cerebellar hematoma. Hypertensive emergency - hx of HTN, unsure of compliance with med regimen Neurology thinking encephalopathy is out of proportion to [place] - MRI brain 11/21  - Continue frequent neurochecks - Continue Coreg, amlodipine, hydralazine - Stroke service primary - AC/VTE ppx per primary - Frequent neuro checks.  - D/c losartan 11/22 given hyperkalemia. - PRN hydralazine, labetalol.   Acute hypoxic respiratory failure with inability to protect the airway - s/p intubation in ED PNA RLL due to klebsiella, strep, h.flu Finished 7 days of antibiotics on 11/22 - Continue breathing trials as tolerated - Follow intermittent chest x-ray  Hx ESRD on HD MWF Hyperkalemia  - Dialysis per nephrology.  Temporary HD cath has been removed. - Will need AV fistulogram for difficulties during dialysis.  Anemia 2/2 CKD and acute blood loss associated with CRRT, now on HD - Follow CBC, transfuse for hemoglobin less than 7  DM2 with hyperglycemia - SSI coverage  At risk for malnutrition - Continue tube feeds  Goals of care - Family meeting on 11/24.  There is no consensus on the plan forward order tracheostomy.  Family wants the weekend to talk amongst themselves and decide.  Best practice (evaluated daily):   Diet/type: tubefeeds DVT prophylaxis: not indicated GI prophylaxis: PPI Lines: N/A Foley:  N/A Code Status:  full code Last date of multidisciplinary goals of care discussion: Per primary.  Critical care time:   The patient is critically ill with multiple organ system failure and requires high complexity decision making for assessment and support, frequent evaluation and titration of therapies, advanced monitoring, review of  radiographic studies and interpretation of complex data.   Critical Care Time devoted to patient care services, exclusive of separately billable procedures, described in this note is 35 minutes.   Marshell Garfinkel MD [facility] See Amion for pager  If no response to pager , please call [phone] until 7pm After 7:00 pm call Elink  [phone] 09/05/2022, 8:37 AM

## 2022-09-05 NOTE — Progress Notes (Addendum)
STROKE TEAM PROGRESS NOTE  INTERVAL HISTORY No family at the bedside. Eyes open, PERRL. Right gaze, inconsistently blinking bilaterally to threat.  Corneal reflex weak bilaterally. responds to pain, slight withdrawal in all extremities. Does move all extremities spontaneously. On Fentanyl gtt '@300mcg'$ ,   WBC 19.5 up from 15.9. Na 131.  No new neurological events overnight.   Vitals:   09/05/22 0430 09/05/22 0600 09/05/22 0800 09/05/22 0912  BP: (!) 141/62 129/62 (!) 140/60 131/65  Pulse: 73 65 71 66  Resp: '15 17 16 19  '$ Temp:   98.5 F (36.9 C)   TempSrc:   Axillary   SpO2: 100% 100% 99% 100%  Weight:      Height:       CBC:  Recent Labs  Lab 09/03/22 0351 09/04/22 0419  WBC 16.0* 15.9*  HGB 8.5* 9.3*  HCT 25.9* 29.2*  MCV 69.8* 71.4*  PLT 446* 501*    Basic Metabolic Panel:  Recent Labs  Lab 09/03/22 0351 09/04/22 0419 09/05/22 0423  NA 140 137  --   K 4.6 3.8  --   CL 92* 92*  --   CO2 23 27  --   GLUCOSE 188* 152*  --   BUN 162* 114*  --   CREATININE 8.86* 6.67*  --   CALCIUM 9.8 9.7  --   MG 2.7* 2.1 1.9  PHOS 11.1* 8.3*  --     Lipid Panel:  No results for input(s): "CHOL", "TRIG", "HDL", "CHOLHDL", "VLDL", "LDLCALC" in the last 168 hours.  HgbA1c:  No results for input(s): "HGBA1C" in the last 168 hours.  Urine Drug Screen: No results for input(s): "LABOPIA", "COCAINSCRNUR", "LABBENZ", "AMPHETMU", "THCU", "LABBARB" in the last 168 hours.  Alcohol Level  No results for input(s): "ETH" in the last 168 hours.   IMAGING past 24 hours VAS [place] LOWER EXTREMITY VENOUS (DVT)  Result Date: 09/04/2022  Lower Venous DVT Study Patient Name:  Randy Ross  Date of Exam:   09/04/2022 Medical Rec #: [id number]          Accession #:    [id number] Date of Birth: 25-Dec-1970         Patient Gender: M Patient Age:   51 years Exam [facility] Procedure:      VAS [place] LOWER EXTREMITY VENOUS (DVT) Referring Phys: Cornelius Moras Charod Slawinski  --------------------------------------------------------------------------------  Indications: Stroke.  Risk Factors: None identified. Comparison Study: No prior studies. Performing Technologist: Oliver Hum RVT  Examination Guidelines: A complete evaluation includes B-mode imaging, spectral Doppler, color Doppler, and power Doppler as needed of all accessible portions of each vessel. Bilateral testing is considered an integral part of a complete examination. Limited examinations for reoccurring indications may be performed as noted. The reflux portion of the exam is performed with the patient in reverse Trendelenburg.  +---------+---------------+---------+-----------+----------+--------------+ RIGHT    CompressibilityPhasicitySpontaneityPropertiesThrombus Aging +---------+---------------+---------+-----------+----------+--------------+ CFV      Full           Yes      Yes                                 +---------+---------------+---------+-----------+----------+--------------+ SFJ      Full                                                        +---------+---------------+---------+-----------+----------+--------------+  FV Prox  Full                                                        +---------+---------------+---------+-----------+----------+--------------+ FV Mid   Full                                                        +---------+---------------+---------+-----------+----------+--------------+ FV DistalFull                                                        +---------+---------------+---------+-----------+----------+--------------+ PFV      Full                                                        +---------+---------------+---------+-----------+----------+--------------+ POP      Full           Yes      Yes                                 +---------+---------------+---------+-----------+----------+--------------+ PTV      Full                                                         +---------+---------------+---------+-----------+----------+--------------+ PERO     Full                                                        +---------+---------------+---------+-----------+----------+--------------+   +---------+---------------+---------+-----------+----------+--------------+ LEFT     CompressibilityPhasicitySpontaneityPropertiesThrombus Aging +---------+---------------+---------+-----------+----------+--------------+ CFV      Full           Yes      Yes                                 +---------+---------------+---------+-----------+----------+--------------+ SFJ      Full                                                        +---------+---------------+---------+-----------+----------+--------------+ FV Prox  Full                                                        +---------+---------------+---------+-----------+----------+--------------+  FV Mid   Full           Yes      Yes                                 +---------+---------------+---------+-----------+----------+--------------+ FV DistalFull                                                        +---------+---------------+---------+-----------+----------+--------------+ PFV      Full                                                        +---------+---------------+---------+-----------+----------+--------------+ POP      Full           Yes      Yes                                 +---------+---------------+---------+-----------+----------+--------------+ PTV      Full                                                        +---------+---------------+---------+-----------+----------+--------------+ PERO     Full                                                        +---------+---------------+---------+-----------+----------+--------------+     Summary: RIGHT: - There is no evidence of deep vein thrombosis in the  lower extremity.  - No cystic structure found in the popliteal fossa.  LEFT: - There is no evidence of deep vein thrombosis in the lower extremity.  - No cystic structure found in the popliteal fossa.  *See table(s) above for measurements and observations. Electronically signed by Harold Barban MD on 09/04/2022 at 8:50:02 PM.    Final     PHYSICAL EXAM  Temp:  [97.8 F (36.6 C)-98.6 F (37 C)] 98.5 F (36.9 C) (11/26 0800) Pulse Rate:  [63-76] 66 (11/26 0912) Resp:  [12-19] 19 (11/26 0912) BP: (110-176)/(47-80) 131/65 (11/26 0912) SpO2:  [96 %-100 %] 100 % (11/26 0912) FiO2 (%):  [40 %] 40 % (11/26 0912)  General - Well nourished, well developed, intubated on sedation with fentanyl only  Cardiovascular - Regular rate and rhythm.  Neuro - intubated on sedation, eyes open, not following commands.  PERRL, right gaze deviation, unable to cross midline, weak corneal reflexes bilaterally, inconsistently blinking bilaterally to threat. Cough present. Breathing over the vent.  Facial symmetry not able to test due to ET tube.  Tongue protrusion not cooperative.  responds to pain, slight withdrawal in all extremities. Moves all extremities spontaneously and not purposefully.   ASSESSMENT/PLAN Randy Ross is a 51 y.o. male with history of end-stage renal disease on intermittent  hemodialysis, diabetes, hypertension presented with  witnessed onset slurred speech, with headache at 10:30 PM for which EMS was activated.  On their arrival they report blood pressure was 260/140, glucose 126.  He was found to have a right sided cerebellar ICH.  Neurosurgery was consulted for possible EVD placement, but they have no plans for operative intervention at this point.  ICH remains stable on CT scan.  Patient has been receiving IHD.  MRI shows scattered small strokes in bilateral hemispheres, likely embolic.  Goals of care discussion with family is ongoing.  ICH: Acute right cerebellar small ICH, likely due  to chronic uncontrolled hypertension Stroke: embolic shower vs. bilateral watershed distribution, etiology unclear, could be cardioembolic versus global hypertension in the setting of sedation and HD Code Stroke CT head Acute intraparenchymal hematoma centered in the right cerebellar hemisphere with severe mass effect on the fourth ventricle and cerebral aqueduct. No hydrocephalus. CT repeat - hemorrhage size stable from earlier today. There is likely extension into the fourth ventricle but no intraventricular spread or hydrocephalus. CT head and neck premature atherosclerosis with notable vessel tortuosity suggesting chronic hypertension CT repeat 11/16 stable hematoma and no hydrocephalus CT Head 11/17 1. Small acute infarct at the right corpus callosum body and cingulate white matter, not seen on admission head CT. 2. Non progressed right cerebellar hematoma and swelling. No hydrocephalus. MRI brain 11/21 no change in right cerebellar IPH, surrounding edema without hydrocephalus.  Multiple small infarcts in bilateral hemispheres, potentially embolic 2D Echo EF 50 to 55% EEG- This study showed generalized periodic discharges with triphasic morphology which is on the ictal-interictal continuum. However, the frequency, morphology and reactivity to stimulation is most likely indicative of toxic-metabolic causes. . Additionally there is moderate to severe diffuse encephalopathy, nonspecific etiology. No seizures were seen throughout the recording.  LDL 91 HgbA1c 5.9 VTE prophylaxis - SCD's No antithrombotic prior to admission, now on No antithrombotic.  Therapy recommendations:  pending Disposition:  pending, palliative care on board for [place] discussion  Cerebral Edema On 3% saline protocol -> off now due to fluid status Last NA 143->148->148 -> 144->137->131 Serial NA checks Q4h -> 23.4% PRN to keep Na goal around 150 11/18- 23.4% bolus ordered NSGY following - no surgical intervention at this  time  Will allow Na to normalize, no further HTS boluses unless change in exam or worsening of cerebral edema on CT 11/23 CT stable ICH  ESRD on HD HD MWF Renal Dr. Jonnie Finner following, appreciate help Had HD catheter CRRT stopped 11/17 due to severe anemia -> transition to HD  Acute hypoxic Respiratory failure  Pneumonia:  Vent management per CCM  On Fentanyl for sedation  Versed as needed for agitation on vent Not candidate for extubation Trach aspirate - Klebseilla, strep and H.flu - on Ceftiaxone. Palliative care to help with GOC and decision making. Family to decide on Monday for trach placement  Hypertension Home meds:  norvasc 10 mg, coreg 25 mg, imdur 30 mg, losartan 100 mg- on hold  Stable off Cleviprex Resume home norvasc and coreg SBP goal < 160 BP goal normotensive  Hyperlipidemia Home meds:  none LDL 91 goal < 70 Consider statin at discharge  Diabetes type II Controlled Home meds:  Semglee HgbA1c 5.9, goal < 7.0 CBGs SSI  dysphagia  OG tube and cortrak placed Start TF's   Leukocytosis  WBC 19.5. afebrile. Monitor   Hospital day # 12   Patient seen and examined by NP/APP with MD. MD to update  note as needed.    Beulah Gandy DNP, ACNPC-AG  [facility] Pager# [phone]  09/05/2022 9:23 AM  ATTENDING NOTE: I reviewed above note and agree with the assessment and plan. Pt was seen and examined.   No family at bedside.  Patient eyes open spontaneously, still intubated on ventilation.  Not blinking to visual threat, not follow commands or tracking, right forced gaze, weak corneal reflexes bilaterally, positive gag and cough, mild withdraw in all extremities.  Continue current management, medication per CCM.  Pending family discussion regarding GOC and decision on tracheostomy.   For detailed assessment and plan, please refer to above/below as I have made changes wherever appropriate.   Rosalin Hawking, MD PhD Stroke  Neurology 09/05/2022 10:53 PM  This patient is critically ill due to cerebellar ICH, cerebral edema, respiratory failure, ESRD, hypertensive emergency and at significant risk of neurological worsening, death form hematoma expansion, brain herniation, obstructive hydrocephalus, seizure. This patient's care requires constant monitoring of vital signs, hemodynamics, respiratory and cardiac monitoring, review of multiple databases, neurological assessment, discussion with family, other specialists and medical decision making of high complexity. I spent 30 minutes of neurocritical care time in the care of this patient.  I discussed with CCM Dr. Vaughan Browner.   To contact Stroke Continuity provider, please refer to [email or web address] After hours, contact General Neurology

## 2022-09-06 ENCOUNTER — Inpatient Hospital Stay (HOSPITAL_COMMUNITY): Payer: No Typology Code available for payment source

## 2022-09-06 DIAGNOSIS — N186 End stage renal disease: Secondary | ICD-10-CM

## 2022-09-06 LAB — CBC
HCT: 27.3 % — ABNORMAL LOW (ref 39.0–52.0)
HCT: 26.7 % — ABNORMAL LOW (ref 39.0–52.0)
Hemoglobin: 8.7 g/dL — ABNORMAL LOW (ref 13.0–17.0)
Hemoglobin: 8.6 g/dL — ABNORMAL LOW (ref 13.0–17.0)
MCH: 22.7 pg — ABNORMAL LOW (ref 26.0–34.0)
MCH: 22.8 pg — ABNORMAL LOW (ref 26.0–34.0)
MCHC: 31.9 g/dL (ref 30.0–36.0)
MCHC: 32.2 g/dL (ref 30.0–36.0)
MCV: 71.1 fL — ABNORMAL LOW (ref 80.0–100.0)
MCV: 70.6 fL — ABNORMAL LOW (ref 80.0–100.0)
Platelets: 595 10*3/uL — ABNORMAL HIGH (ref 150–400)
Platelets: UNDETERMINED 10*3/uL (ref 150–400)
RBC: 3.84 MIL/uL — ABNORMAL LOW (ref 4.22–5.81)
RBC: 3.78 MIL/uL — ABNORMAL LOW (ref 4.22–5.81)
RDW: 22.5 % — ABNORMAL HIGH (ref 11.5–15.5)
RDW: 22.5 % — ABNORMAL HIGH (ref 11.5–15.5)
WBC: 17.7 10*3/uL — ABNORMAL HIGH (ref 4.0–10.5)
WBC: 17.9 10*3/uL — ABNORMAL HIGH (ref 4.0–10.5)
nRBC: 0.2 % (ref 0.0–0.2)
nRBC: 0.3 % — ABNORMAL HIGH (ref 0.0–0.2)

## 2022-09-06 LAB — GLUCOSE, CAPILLARY
Glucose-Capillary: 132 mg/dL — ABNORMAL HIGH (ref 70–99)
Glucose-Capillary: 153 mg/dL — ABNORMAL HIGH (ref 70–99)
Glucose-Capillary: 166 mg/dL — ABNORMAL HIGH (ref 70–99)
Glucose-Capillary: 179 mg/dL — ABNORMAL HIGH (ref 70–99)
Glucose-Capillary: 191 mg/dL — ABNORMAL HIGH (ref 70–99)
Glucose-Capillary: 212 mg/dL — ABNORMAL HIGH (ref 70–99)

## 2022-09-06 LAB — RENAL FUNCTION PANEL
Albumin: 2.4 g/dL — ABNORMAL LOW (ref 3.5–5.0)
Albumin: 2.5 g/dL — ABNORMAL LOW (ref 3.5–5.0)
Anion gap: 17 — ABNORMAL HIGH (ref 5–15)
Anion gap: 17 — ABNORMAL HIGH (ref 5–15)
BUN: 130 mg/dL — ABNORMAL HIGH (ref 6–20)
BUN: 148 mg/dL — ABNORMAL HIGH (ref 6–20)
CO2: 22 mmol/L (ref 22–32)
CO2: 22 mmol/L (ref 22–32)
Calcium: 9.1 mg/dL (ref 8.9–10.3)
Calcium: 9.1 mg/dL (ref 8.9–10.3)
Chloride: 90 mmol/L — ABNORMAL LOW (ref 98–111)
Chloride: 89 mmol/L — ABNORMAL LOW (ref 98–111)
Creatinine, Ser: 7.12 mg/dL — ABNORMAL HIGH (ref 0.61–1.24)
Creatinine, Ser: 8.13 mg/dL — ABNORMAL HIGH (ref 0.61–1.24)
GFR, Estimated: 9 mL/min — ABNORMAL LOW (ref >60)
GFR, Estimated: 7 mL/min — ABNORMAL LOW (ref >60)
Glucose, Bld: 126 mg/dL — ABNORMAL HIGH (ref 70–99)
Glucose, Bld: 155 mg/dL — ABNORMAL HIGH (ref 70–99)
Phosphorus: 8 mg/dL — ABNORMAL HIGH (ref 2.5–4.6)
Phosphorus: 8.1 mg/dL — ABNORMAL HIGH (ref 2.5–4.6)
Potassium: 4.4 mmol/L (ref 3.5–5.1)
Potassium: 4.9 mmol/L (ref 3.5–5.1)
Sodium: 129 mmol/L — ABNORMAL LOW (ref 135–145)
Sodium: 128 mmol/L — ABNORMAL LOW (ref 135–145)

## 2022-09-06 LAB — MAGNESIUM: Magnesium: 2.2 mg/dL (ref 1.7–2.4)

## 2022-09-06 MED ORDER — PROPOFOL 1000 MG/100ML IV EMUL
5.0000 ug/kg/min | INTRAVENOUS | Status: DC
  Administered 2022-09-06: 40 ug/kg/min via INTRAVENOUS

## 2022-09-06 MED ORDER — PROPOFOL 500 MG/50ML IV EMUL
INTRAVENOUS | Status: AC
  Filled 2022-09-06: qty 50

## 2022-09-06 NOTE — Procedures (Signed)
Central Venous Catheter Insertion Procedure Note  Randy Ross  [id number]  02-21-1971  Date:09/06/22  Time:1:42 PM   Provider Performing:Yoshi Vicencio D Rollene Rotunda   Procedure: Insertion of Non-tunneled Central Venous Catheter(36556)with US guidance (50539)    Indication(s) Hemodialysis  Consent Risks of the procedure as well as the alternatives and risks of each were explained to the patient and/or caregiver.  Consent for the procedure was obtained and is signed in the bedside chart  Anesthesia Topical only with 1% lidocaine   Timeout Verified patient identification, verified procedure, site/side was marked, verified correct patient position, special equipment/implants available, medications/allergies/relevant history reviewed, required imaging and test results available.  Sterile Technique Maximal sterile technique including full sterile barrier drape, hand hygiene, sterile gown, sterile gloves, mask, hair covering, sterile ultrasound probe cover (if used).  Procedure Description Area of catheter insertion was cleaned with chlorhexidine and draped in sterile fashion.   With real-time ultrasound guidance a HD catheter was placed into the right internal jugular vein.  Nonpulsatile blood flow and easy flushing noted in all ports.  The catheter was sutured in place and sterile dressing applied.  Complications/Tolerance None; patient tolerated the procedure well. Chest X-ray is ordered to verify placement for internal jugular or subclavian cannulation.  Chest x-ray is not ordered for femoral cannulation.  EBL Minimal  Specimen(s) None  JD Rexene Agent [facility] 09/06/2022, 1:42 PM  Please see [email or web address] for pager details.  From 7A-7P if no response, please call [phone]. After hours, please call ELink [phone].

## 2022-09-06 NOTE — Progress Notes (Addendum)
STROKE TEAM PROGRESS NOTE  INTERVAL HISTORY Family at bedside. Family is waiting on more family members to arrive before making any medical decisions.  Fentanyl at 170mg.  Eyes open and blinking to threat bilaterally. Does not follow commands  No new neurological events overnight.   Vitals:   09/06/22 0816 09/06/22 0830 09/06/22 0900 09/06/22 1000  BP: (!) 157/52 (!) 155/55 (!) 156/71 (!) 159/56  Pulse: 69 70 69 69  Resp: '16 11 10   '$ Temp: 98 F (36.7 C)     TempSrc: Axillary     SpO2: 96% 99% 99% 98%  Weight:      Height:       CBC:  Recent Labs  Lab 09/05/22 1223 09/06/22 0503  WBC 19.5* 17.7*  HGB 9.2* 8.7*  HCT 28.3* 27.3*  MCV 70.9* 71.1*  PLT 543* 595*    Basic Metabolic Panel:  Recent Labs  Lab 09/05/22 0423 09/05/22 1223 09/06/22 0503  NA  --  131* 129*  K  --  4.7 4.4  CL  --  88* 90*  CO2  --  25 22  GLUCOSE  --  162* 126*  BUN  --  103* 130*  CREATININE  --  5.61* 7.12*  CALCIUM  --  9.3 9.1  MG 1.9  --  2.2  PHOS  --  7.2* 8.0*    Lipid Panel:  No results for input(s): "CHOL", "TRIG", "HDL", "CHOLHDL", "VLDL", "LDLCALC" in the last 168 hours.  HgbA1c:  No results for input(s): "HGBA1C" in the last 168 hours.  Urine Drug Screen: No results for input(s): "LABOPIA", "COCAINSCRNUR", "LABBENZ", "AMPHETMU", "THCU", "LABBARB" in the last 168 hours.  Alcohol Level  No results for input(s): "ETH" in the last 168 hours.   IMAGING past 24 hours No results found.  PHYSICAL EXAM  Temp:  [97.6 F (36.4 C)-98.9 F (37.2 C)] 98 F (36.7 C) (11/27 0816) Pulse Rate:  [62-73] 69 (11/27 1000) Resp:  [10-18] 10 (11/27 0900) BP: (122-170)/(52-88) 159/56 (11/27 1000) SpO2:  [95 %-100 %] 98 % (11/27 1000) FiO2 (%):  [30 %] 30 % (11/27 0816)  General - Well nourished, well developed, intubated on sedation with fentanyl only  Cardiovascular - Regular rate and rhythm.  Neuro - intubated on sedation, eyes open, not following commands.  PERRL, right gaze  deviation, unable to cross midline, weak corneal reflexes bilaterally, inconsistently blinking bilaterally to threat. Cough present. Breathing over the vent.  Facial symmetry not able to test due to ET tube.  Tongue protrusion not cooperative.  responds to pain, slight withdrawal in all extremities. Moves all extremities spontaneously and not purposefully.   ASSESSMENT/PLAN Mr. VJaion Lagrangeis a 51y.o. male with history of end-stage renal disease on intermittent hemodialysis, diabetes, hypertension presented with  witnessed onset slurred speech, with headache at 10:30 PM for which EMS was activated.  On their arrival they report blood pressure was 260/140, glucose 126.  He was found to have a right sided cerebellar ICH.  Neurosurgery was consulted for possible EVD placement, but they have no plans for operative intervention at this point.  ICH remains stable on CT scan.  Patient has been receiving IHD.  MRI shows scattered small strokes in bilateral hemispheres, likely embolic.  Goals of care discussion with family is ongoing.  ICH: Acute right cerebellar small ICH, likely due to chronic uncontrolled hypertension Stroke: embolic shower vs. bilateral watershed distribution, etiology unclear, could be cardioembolic versus global hypertension in the setting of sedation and  HD Code Stroke CT head Acute intraparenchymal hematoma centered in the right cerebellar hemisphere with severe mass effect on the fourth ventricle and cerebral aqueduct. No hydrocephalus. CT repeat - hemorrhage size stable from earlier today. There is likely extension into the fourth ventricle but no intraventricular spread or hydrocephalus. CT head and neck premature atherosclerosis with notable vessel tortuosity suggesting chronic hypertension CT repeat 11/16 stable hematoma and no hydrocephalus CT Head 11/17 1. Small acute infarct at the right corpus callosum body and cingulate white matter, not seen on admission head CT. 2.  Non progressed right cerebellar hematoma and swelling. No hydrocephalus. MRI brain 11/21 no change in right cerebellar IPH, surrounding edema without hydrocephalus.  Multiple small infarcts in bilateral hemispheres, potentially embolic 2D Echo EF 50 to 55% EEG- This study showed generalized periodic discharges with triphasic morphology which is on the ictal-interictal continuum. However, the frequency, morphology and reactivity to stimulation is most likely indicative of toxic-metabolic causes. . Additionally there is moderate to severe diffuse encephalopathy, nonspecific etiology. No seizures were seen throughout the recording.  LDL 91 HgbA1c 5.9 VTE prophylaxis - SCD's No antithrombotic prior to admission, now on No antithrombotic.  Therapy recommendations:  pending Disposition:  pending, palliative care on board for [place] discussion  Cerebral Edema On 3% saline protocol -> off now due to fluid status Last NA 143->148->148 -> 144->137->131-129 Serial NA checks Q4h -> 23.4% PRN to keep Na goal around 150 11/18- 23.4% bolus ordered NSGY following - no surgical intervention at this time  Will allow Na to normalize, no further HTS boluses unless change in exam or worsening of cerebral edema on CT 11/23 CT stable ICH  ESRD on HD HD MWF Renal Dr. Jonnie Finner following, appreciate help Had HD catheter CRRT stopped 11/17 due to severe anemia -> transition to HD  Acute hypoxic Respiratory failure  Pneumonia:  Vent management per CCM  On Fentanyl for sedation  Versed as needed for agitation on vent Not candidate for extubation Trach aspirate - Klebseilla, strep and H.flu - on Ceftiaxone. Palliative care to help with GOC and decision making. Family to decide on Monday for trach placement  Hypertension Home meds:  norvasc 10 mg, coreg 25 mg, imdur 30 mg, losartan 100 mg- on hold  Stable off Cleviprex Resume home norvasc and coreg SBP goal < 160 BP goal normotensive  Hyperlipidemia Home  meds:  none LDL 91 goal < 70 Consider statin at discharge  Diabetes type II Controlled Home meds:  Semglee HgbA1c 5.9, goal < 7.0 CBGs SSI  dysphagia  OG tube and cortrak placed Start TF's   Leukocytosis  WBC 19.5->17.7 afebrile. Monitor   Chronic anemia  Hgb 8.7  Hospital day # 13   Patient seen and examined by NP/APP with MD. MD to update note as needed.    Beulah Gandy DNP, ACNPC-AG  [facility] Pager# [phone]  09/06/2022 11:00 AM  ATTENDING NOTE: I reviewed above note and agree with the assessment and plan. Pt was seen and examined.   No family at bedside.  Patient neuro stable, no significant change, eyes spontaneous open with spontaneous blinking.  However not consistent blink to visual threat, no active tracking, no eye contact.  Still intubated, spontaneous moving all extremities, however with pain stimulation no significant movement.  Continue dialysis.  Palliative care on board, plan to have family in for family meeting.   For detailed assessment and plan, please refer to above/below as I have made changes wherever appropriate.   Rosalin Hawking,  MD PhD Stroke Neurology 09/06/2022 10:50 PM  This patient is critically ill due to cerebellar ICH, cerebral edema, respiratory failure, ESRD, hypertensive emergency and at significant risk of neurological worsening, death form hematoma expansion, brain herniation, obstructive hydrocephalus, seizure. This patient's care requires constant monitoring of vital signs, hemodynamics, respiratory and cardiac monitoring, review of multiple databases, neurological assessment, discussion with family, other specialists and medical decision making of high complexity. I spent 30 minutes of neurocritical care time in the care of this patient.   To contact Stroke Continuity provider, please refer to [email or web address] After hours, contact General Neurology

## 2022-09-06 NOTE — Progress Notes (Signed)
  Transition of Care [facility]) Screening Note   Patient Details  Name: Randy Ross Date of Birth: 1971-03-18   Transition of Care [facility]) CM/SW Contact:    Benard Halsted, LCSW Phone Number: 09/06/2022, 5:08 PM    Transition of Care Department [facility]) has reviewed patient. We will continue to monitor patient advancement through interdisciplinary progression rounds. If new patient transition needs arise, please place a TOC consult.

## 2022-09-06 NOTE — Progress Notes (Signed)
Palliative: [name] is lying quietly in bed.  He appears acutely/chronically ill, intubated/ventilated and sedated.  He clearly cannot make his needs known.  Bedside nursing staff is present along with CCM NP for central line/HD cath placement.  Family is in waiting room across the hall.  We talk about need for HD and temporary HD catheter placement today.  We talked about goal of weaning fentanyl to see mental status.  We talked about ventilator weaning.  I shared that if Randy Ross is able to wean from the ventilator, this does not mean that he will necessarily have quality of life.  They all state understanding.  We talked about the reason for tracheostomy, to protect the vocal cords and reduce reduce oral/throat damage.  We talked about possible need for PEG tube if he is unable to safely eat/feed himself.  Sister at bedside and shares that their father died with a stroke approximately 3 years ago.  She shared that Randy Ross told his family that if he could not "taste, walk, he would not want to live".  Some family members are silent as she says this, but most family present agree that he did endorse that he would not want to live in a diminished state.  Family states that they do not want to make further choices until brother from [place] arrives at bedside on Friday midday.  We talk about the benefits of multidisciplinary family meeting 12/1.  Time to be determined.  Conference with CCM attending, CCM NP and bedside, neurology, bedside nursing staff, transition of care team related to patient condition, needs, goals of care, disposition.  Plan:   At this point/full code.  Time for outcomes.  Plan for family meeting 12/1 when brother arrives from [place].  66 minutes  Randy Axe, NP Palliative medicine team Team phone [phone] Greater than 50% of this time was spent counseling and coordinating care related to the above assessment and plan.

## 2022-09-06 NOTE — Progress Notes (Addendum)
  [facility] Progress Note   Subjective:   seen in room, no new changes  Objective Vitals:   09/06/22 0730 09/06/22 0800 09/06/22 0816 09/06/22 0830  BP: (!) 156/57 (!) 157/52 (!) 157/52 (!) 155/55  Pulse: 67 69 69 70  Resp: '16 16 16 11  '$ Temp:   98 F (36.7 C)   TempSrc:   Axillary   SpO2: 96% 96% 96% 99%  Weight:      Height:       Physical Exam General: Orally intubated, NG tube Heart: RRR, no murmurs, rubs or gallops Lungs: CTA anteriorly, on vent Abdomen: Soft, non-distended, +BS Extremities: no edema b/l lower extremties Dialysis Access:  LUE AVF + bruit    Dialysis Orders: SW MWF 3h 72mn  80kg   400/800   2/2 bath  Hep 2000  LFA AVF - last HD 11/13, post 80.1kg - mircera 150 q2, last 11/13 - hectorol 432m IV q HD  Assessment/Plan: 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 SP 3% course of hypertonic fluids.  Uncont HTN - getting coreg/amlodipine per tube. Close to dry weight, will continue to titrate down volume as tolerated Hyperkalemia - recurrent issue such that TF's were changed to Nepro on 11/20. Is now off lokelma.  ESRD: Usual MWF schedule, required CRRT 11/15-11/17/23, now back on iHD. Next HD today. BUN very high, suspect access malfunction. IR consulted for fistulogram.  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 Secondary HPTH: CCa ok. Phos 8.3, started renvela per NG tube. Continue VDRA.  DM2 - per pmd   RobKelly SplinterD 09/06/2022, 8:59 AM  Recent Labs  Lab 09/05/22 1223 09/06/22 0503  HGB 9.2* 8.7*  ALBUMIN 2.5* 2.4*  CALCIUM 9.3 9.1  PHOS 7.2* 8.0*  CREATININE 5.61* 7.12*  K 4.7 4.4    Inpatient medications:  amLODipine  5 mg Per Tube BID   carvedilol  25 mg Per Tube BID WC   Chlorhexidine Gluconate Cloth  6 each Topical Daily   Chlorhexidine Gluconate Cloth  6 each Topical Q0600   Chlorhexidine Gluconate Cloth  6 each  Topical Q0600   Chlorhexidine Gluconate Cloth  6 each Topical Q0600   darbepoetin (ARANESP) injection - DIALYSIS  150 mcg Subcutaneous Q Sun-1800   feeding supplement (PROSource TF20)  60 mL Per Tube BID   heparin injection (subcutaneous)  5,000 Units Subcutaneous Q8H   hydrALAZINE  50 mg Per Tube Q8H   insulin aspart  0-15 Units Subcutaneous Q4H   multivitamin  1 tablet Per Tube QHS   nutrition supplement (JUVEN)  1 packet Per Tube BID BM   mouth rinse  15 mL Mouth Rinse Q2H   pantoprazole (PROTONIX) IV  40 mg Intravenous Daily   sevelamer carbonate  2.4 g Per Tube TID WC    sodium chloride Stopped (08/30/22 0033)   feeding supplement (NEPRO CARB STEADY) 20 mL/hr at 09/06/22 0800   fentaNYL infusion INTRAVENOUS 300 mcg/hr (09/06/22 0800)   sodium chloride, acetaminophen (TYLENOL) oral liquid 160 mg/5 mL, bisacodyl, docusate, fentaNYL, hydrALAZINE, labetalol, lidocaine (PF), lidocaine-prilocaine, midazolam, ondansetron (ZOFRAN) IV, mouth rinse, pentafluoroprop-tetrafluoroeth, polyethylene glycol

## 2022-09-06 NOTE — Progress Notes (Signed)
[name], MRN:  [id number], DOB:  1971/06/01, LOS: 14 ADMISSION DATE:  08/24/2022, CONSULTATION DATE:  08/25/2022 REFERRING MD:  [place] - Neuro CHIEF COMPLAINT:  AMS   History of Present Illness:  51 year old man who [facility] ED 11/14 with left sided visual preference, left sided weakness, slurred speech. He was also profoundly hypertensive with SBP over 200s (highest documented is SBP 230). PMHx significant for HTN, T2DM, ESRD on HD (MWF).  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 He was intubated in the ED and neurosurgery was consulted for consideration of EVD.  He was admitted by neurology and PCCM was called in consultation for vent management.  Pertinent Medical History:   Past Medical History:  Diagnosis Date   Anemia    CKD (chronic kidney disease)    on wed 12/09/21   Diabetes mellitus without complication ([place])    type 2   Hypertension    Renal disorder    Significant Hospital Events: Including procedures, antibiotic start and stop dates in addition to other pertinent events   11/15 - Admitted overnight. 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  CTA Head/Neck without e/o vascular lesion at R cerebellar hemorrhage. HTS 3% initiated. Trialysis catheter placed L IJ for CRRT/HTS 23% (per Stroke team). CRRT initiated. 11/16 Low grade temp with Tmax 99.4, WBC uptrending 25.9 (9.0). CXR with increased RLL opacities. Cefepime initiated. Resp Cx/TA > mult spec h.flu, strep species, klebsiella species. TG elevated, weaning prop. 11/17 Ongoing low grade temps Tmax 100.23F. WBC downtrending 19.3 (25.9). CRRT filter clotting causing blood loss, Hgb 6.3. CRRT stopped, 1U PRBCs transfused. HD to resume. Repeat CT Head with small acute infarct at R corpus  callosum body/cingulate white matter, stable R cerebellar hematoma. CXR improved on cefepime. 11/19 stirs to touch 11/24 meeting with palliative care, neurology and [facility].  Family still discussing options among themselves.  Interim History / Subjective:   No acute events.  Remains on the ventilator. Mental status is unchanged but remains on 300 of fentanyl.  Objective:  Blood pressure (!) 179/77, pulse 71, temperature 98 F (36.7 C), temperature source Axillary, resp. rate (!) 21, height '5\' 5"'$  (1.651 m), weight 82.2 kg, SpO2 98 %.    Vent Mode: PRVC FiO2 (%):  [30 %] 30 % Set Rate:  [16 bmp] 16 bmp Vt Set:  [490 mL] 490 mL PEEP:  [5 cmH20] 5 cmH20 Pressure Support:  [10 cmH20] 10 cmH20 Plateau Pressure:  [15 cmH20-17 cmH20] 15 cmH20   Intake/Output Summary (Last 24 hours) at 09/06/2022 1258 Last data filed at 09/06/2022 1200 Gross per 24 hour  Intake 1459.45 ml  Output --  Net 1459.45 ml    Filed Weights   09/01/22 1142 09/03/22 1042 09/03/22 1446  Weight: 84.6 kg 84.6 kg 82.2 kg   Physical Examination: Blood pressure (!) 140/60, pulse 71, temperature 98.5 F (36.9 C), temperature source Axillary, resp. rate 16, height '5\' 5"'$  (1.651 m), weight 82.2 kg, SpO2 99 %. Gen:      No acute distress HEENT:  EOMI, sclera anicteric, ET tube Neck:     No masses; no thyromegaly Lungs:    Clear to auscultation bilaterally; normal respiratory effort CV:         Regular rate and rhythm; no murmurs  Abd:      + bowel sounds; soft, non-tender; no palpable masses, no distension Ext:    No edema; adequate peripheral perfusion Skin:      Warm and dry; no rash Neuro: Eyes open, does not follow commands.  Pupillary reflex present    Labs/imaging personally reviewed:  CT head 11/4 > 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 CTA head 11/14 > no e/o vascular lesion at R cerebellar hemorrhage MRI  brain 11/21 > no significant changes, many small acute infarcts Creatinine 7.12  Assessment & Plan:   Acute IPH with severe mass effect/brain swelling - presumed hypertensive in nature Repeat CT Head with small acute infarct at R corpus callosum body/cingulate white matter, stable R cerebellar hematoma. Hypertensive emergency - hx of HTN, unsure of compliance with med regimen Neurology thinking encephalopathy is out of proportion to [place] - but patient remains on narcotics.  - Continue frequent neurochecks - Continue Coreg, amlodipine, hydralazine - Stroke service primary - AC/VTE ppx per primary - Frequent neuro checks.  - D/c losartan 11/22 given hyperkalemia. - PRN hydralazine, labetalol.   Acute hypoxic respiratory failure with inability to protect the airway - s/p intubation in ED PNA RLL due to klebsiella, strep, h.flu Finished 7 days of antibiotics on 11/22 - Continue breathing trials as tolerated - Follow intermittent chest x-ray - Really need to see how much mental status will improve off all sedation and after HD.   Hx ESRD on HD MWF Hyperkalemia  - Dialysis per nephrology.  Temporary HD cath has been removed. - Will need AV fistulogram for difficulties during dialysis. - Will place temporary HD catheter.   Anemia 2/2 CKD and acute blood loss associated with CRRT, now on HD - Follow CBC, transfuse for hemoglobin less than 7  DM2 with hyperglycemia - SSI coverage  At risk for malnutrition - Continue tube feeds  Goals of care - Family meeting on 11/24.  There is no consensus on the plan forward order tracheostomy.  Family wants the weekend to talk amongst themselves and decide.  Best practice (evaluated daily):   Diet/type: tubefeeds DVT prophylaxis: not indicated GI prophylaxis: PPI Lines: N/A Foley:  N/A Code Status:  full code Last date of multidisciplinary goals of care discussion: Per primary.  CRITICAL CARE Performed by: Randy Ross   Total critical  care time: 40 minutes  Critical care time was exclusive of separately billable procedures and treating other patients.  Critical care was necessary to treat or prevent imminent or life-threatening deterioration.  Critical care was time spent personally by me on the following activities: development of treatment plan with patient and/or surrogate as well as nursing, discussions with consultants, evaluation of patient's response to treatment, examination of patient, obtaining history from patient or surrogate, ordering and performing treatments and interventions, ordering and review of laboratory studies, ordering and review of radiographic studies, pulse oximetry, re-evaluation of patient's condition and participation in multidisciplinary rounds.  Randy Brood, MD [facility] ICU Physician [place]  Pager: [phone] Mobile: [phone] After hours: [phone].

## 2022-09-07 ENCOUNTER — Inpatient Hospital Stay (HOSPITAL_COMMUNITY): Payer: No Typology Code available for payment source

## 2022-09-07 DIAGNOSIS — I619 Nontraumatic intracerebral hemorrhage, unspecified: Secondary | ICD-10-CM

## 2022-09-07 LAB — CBC
HCT: 25.1 % — ABNORMAL LOW (ref 39.0–52.0)
Hemoglobin: 8.2 g/dL — ABNORMAL LOW (ref 13.0–17.0)
MCH: 22.7 pg — ABNORMAL LOW (ref 26.0–34.0)
MCHC: 32.7 g/dL (ref 30.0–36.0)
MCV: 69.3 fL — ABNORMAL LOW (ref 80.0–100.0)
Platelets: 690 10*3/uL — ABNORMAL HIGH (ref 150–400)
RBC: 3.62 MIL/uL — ABNORMAL LOW (ref 4.22–5.81)
RDW: 22.1 % — ABNORMAL HIGH (ref 11.5–15.5)
WBC: 17.7 10*3/uL — ABNORMAL HIGH (ref 4.0–10.5)
nRBC: 0.2 % (ref 0.0–0.2)

## 2022-09-07 LAB — BASIC METABOLIC PANEL
Anion gap: 16 — ABNORMAL HIGH (ref 5–15)
BUN: 64 mg/dL — ABNORMAL HIGH (ref 6–20)
CO2: 25 mmol/L (ref 22–32)
Calcium: 9.4 mg/dL (ref 8.9–10.3)
Chloride: 93 mmol/L — ABNORMAL LOW (ref 98–111)
Creatinine, Ser: 4.11 mg/dL — ABNORMAL HIGH (ref 0.61–1.24)
GFR, Estimated: 17 mL/min — ABNORMAL LOW (ref >60)
Glucose, Bld: 178 mg/dL — ABNORMAL HIGH (ref 70–99)
Potassium: 5 mmol/L (ref 3.5–5.1)
Sodium: 134 mmol/L — ABNORMAL LOW (ref 135–145)

## 2022-09-07 LAB — GLUCOSE, CAPILLARY
Glucose-Capillary: 141 mg/dL — ABNORMAL HIGH (ref 70–99)
Glucose-Capillary: 167 mg/dL — ABNORMAL HIGH (ref 70–99)
Glucose-Capillary: 135 mg/dL — ABNORMAL HIGH (ref 70–99)
Glucose-Capillary: 197 mg/dL — ABNORMAL HIGH (ref 70–99)
Glucose-Capillary: 172 mg/dL — ABNORMAL HIGH (ref 70–99)
Glucose-Capillary: 114 mg/dL — ABNORMAL HIGH (ref 70–99)

## 2022-09-07 LAB — RENAL FUNCTION PANEL
Albumin: 2.4 g/dL — ABNORMAL LOW (ref 3.5–5.0)
Anion gap: 16 — ABNORMAL HIGH (ref 5–15)
BUN: 174 mg/dL — ABNORMAL HIGH (ref 6–20)
CO2: 24 mmol/L (ref 22–32)
Calcium: 9.1 mg/dL (ref 8.9–10.3)
Chloride: 87 mmol/L — ABNORMAL LOW (ref 98–111)
Creatinine, Ser: 9.11 mg/dL — ABNORMAL HIGH (ref 0.61–1.24)
GFR, Estimated: 6 mL/min — ABNORMAL LOW (ref >60)
Glucose, Bld: 138 mg/dL — ABNORMAL HIGH (ref 70–99)
Phosphorus: 8.2 mg/dL — ABNORMAL HIGH (ref 2.5–4.6)
Potassium: 4.6 mmol/L (ref 3.5–5.1)
Sodium: 127 mmol/L — ABNORMAL LOW (ref 135–145)

## 2022-09-07 LAB — MAGNESIUM: Magnesium: 2.4 mg/dL (ref 1.7–2.4)

## 2022-09-07 MED ORDER — HEPARIN SODIUM (PORCINE) 1000 UNIT/ML IJ SOLN
INTRAMUSCULAR | Status: AC
  Administered 2022-09-07: 1000 [IU] via INTRAVENOUS_CENTRAL
  Filled 2022-09-07: qty 3

## 2022-09-07 MED ORDER — CHLORHEXIDINE GLUCONATE CLOTH 2 % EX PADS
6.0000 | MEDICATED_PAD | Freq: Every day | CUTANEOUS | Status: DC
  Administered 2022-09-08 – 2022-09-21 (×14): 6 via TOPICAL

## 2022-09-07 NOTE — Progress Notes (Signed)
Pt placed on PSV 5/5 per wean protocol and is tolerating well at this time. RT to continue to monitor.

## 2022-09-07 NOTE — Progress Notes (Signed)
NAMEAlezander Ross, MRN:  [id number], DOB:  [birth year], LOS: 11 ADMISSION DATE:  08/24/2022, CONSULTATION DATE:  08/25/2022 REFERRING MD:  [place] - Neuro CHIEF COMPLAINT:  AMS   History of Present Illness:  51 year old man who presented to [facility] ED 11/14 with left sided visual preference, left sided weakness, slurred speech. He was also profoundly hypertensive with SBP over 200s (highest documented is SBP 230). PMHx significant for HTN, T2DM, ESRD on HD (MWF).  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 He was intubated in the ED and neurosurgery was consulted for consideration of EVD.  He was admitted by neurology and PCCM was called in consultation for vent management.  Pertinent Medical History:   Past Medical History:  Diagnosis Date   Anemia    CKD (chronic kidney disease)    on wed 12/09/21   Diabetes mellitus without complication ([place])    type 2   Hypertension    Renal disorder    Significant Hospital Events: Including procedures, antibiotic start and stop dates in addition to other pertinent events   11/15 - Admitted overnight. 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  CTA Head/Neck without e/o vascular lesion at R cerebellar hemorrhage. HTS 3% initiated. Trialysis catheter placed L IJ for CRRT/HTS 23% (per Stroke team). CRRT initiated. 11/16 Low grade temp with Tmax 99.4, WBC uptrending 25.9 (9.0). CXR with increased RLL opacities. Cefepime initiated. Resp Cx/TA > mult spec h.flu, strep species, klebsiella species. TG elevated, weaning prop. 11/17 Ongoing low grade temps Tmax 100.91F. WBC downtrending 19.3 (25.9). CRRT filter clotting causing blood loss, Hgb 6.3. CRRT stopped, 1U PRBCs transfused. HD to resume. Repeat CT Head with small acute infarct at R corpus  callosum body/cingulate white matter, stable R cerebellar hematoma. CXR improved on cefepime. 11/19 stirs to touch 11/24 meeting with palliative care, neurology and [facility].  Family still discussing options among themselves. 11/27 temporary HD catheter placed as fistula not functioning.   Interim History / Subjective:   More awake today off sedation, but will develop coughing spells. Followed commands for HD RN  Objective:  Blood pressure (!) 162/66, pulse 77, temperature 98.2 F (36.8 C), resp. rate 14, height '5\' 5"'$  (1.651 m), weight 83.9 kg, SpO2 99 %.    Vent Mode: PSV;CPAP FiO2 (%):  [30 %] 30 % Set Rate:  [16 bmp] 16 bmp Vt Set:  [490 mL] 490 mL PEEP:  [5 cmH20] 5 cmH20 Pressure Support:  [5 cmH20] 5 cmH20 Plateau Pressure:  [13 cmH20-14 cmH20] 13 cmH20   Intake/Output Summary (Last 24 hours) at 09/07/2022 1405 Last data filed at 09/07/2022 1335 Gross per 24 hour  Intake 1678.68 ml  Output 2135 ml  Net -456.32 ml    Filed Weights   09/03/22 1446 09/07/22 0730 09/07/22 1204  Weight: 82.2 kg 85.8 kg 83.9 kg   Physical Examination: Blood pressure (!) 140/60, pulse 71, temperature 98.5 F (36.9 C), temperature source Axillary, resp. rate 16, height '5\' 5"'$  (1.651 m), weight 82.2 kg, SpO2 99 %. Gen:      No acute distress HEENT:  EOMI, sclera anicteric, ET tube Neck:     No masses; no thyromegaly Lungs:    Clear to auscultation bilaterally; normal respiratory effort CV:         Regular rate  and rhythm; no murmurs Abd:      + bowel sounds; soft, non-tender; no palpable masses, no distension Ext:    No edema; adequate peripheral perfusion Skin:      Warm and dry; no rash Neuro: Eyes open, moves purposefully in all 4 limbs    Labs/imaging personally reviewed:  CT head 11/4 > 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 CTA head 11/14 > no e/o vascular lesion at R cerebellar  hemorrhage MRI brain 11/21 > no significant changes, many small acute infarcts Creatinine 7.12  Assessment & Plan:   Acute IPH with severe mass effect/brain swelling - presumed hypertensive in nature Repeat CT Head with small acute infarct at R corpus callosum body/cingulate white matter, stable R cerebellar hematoma. Hypertensive emergency - hx of HTN, unsure of compliance with med regimen Neurology thinking encephalopathy is out of proportion to [place] - but patient remains on narcotics.  - Continue frequent neurochecks - Continue Coreg, amlodipine, hydralazine - Stroke service primary - AC/VTE ppx per primary - Frequent neuro checks.  - D/c losartan 11/22 given hyperkalemia. - PRN hydralazine, labetalol.   Acute hypoxic respiratory failure with inability to protect the airway - s/p intubation in ED PNA RLL due to klebsiella, strep, h.flu Finished 7 days of antibiotics on 11/22 - Continue breathing trials as tolerated - Follow intermittent chest x-ray - Really need to see how much mental status will improve off all sedation and after HD.   Hx ESRD on HD MWF Hyperkalemia  - Dialysis per nephrology.  Temporary HD cath has been removed. - Will need AV fistulogram for difficulties during dialysis. - Will place temporary HD catheter.   Anemia 2/2 CKD and acute blood loss associated with CRRT, now on HD - Follow CBC, transfuse for hemoglobin less than 7  DM2 with hyperglycemia - SSI coverage  At risk for malnutrition - Continue tube feeds  Goals of care - Family meeting on 11/24.  There is no consensus on the plan forward order tracheostomy.  Family wants the weekend to talk amongst themselves and decide. Also need clean off sedation examination  Best practice (evaluated daily):   Diet/type: tubefeeds DVT prophylaxis: not indicated GI prophylaxis: PPI Lines: N/A Foley:  N/A Code Status:  full code Last date of multidisciplinary goals of care discussion: Per  primary.  CRITICAL CARE Performed by: Kipp Brood   Total critical care time: 40 minutes  Critical care time was exclusive of separately billable procedures and treating other patients.  Critical care was necessary to treat or prevent imminent or life-threatening deterioration.  Critical care was time spent personally by me on the following activities: development of treatment plan with patient and/or surrogate as well as nursing, discussions with consultants, evaluation of patient's response to treatment, examination of patient, obtaining history from patient or surrogate, ordering and performing treatments and interventions, ordering and review of laboratory studies, ordering and review of radiographic studies, pulse oximetry, re-evaluation of patient's condition and participation in multidisciplinary rounds.  Kipp Brood, MD [facility] ICU Physician [place]  Pager: [phone] Mobile: [phone] After hours: [phone].

## 2022-09-07 NOTE — Progress Notes (Signed)
  [facility] Progress Note   Subjective:   seen in room, on HD now. Not responding and not following commands.   Objective Vitals:   09/07/22 1114 09/07/22 1115 09/07/22 1130 09/07/22 1145  BP:  (!) 181/75 (!) 188/79 (!) 189/70  Pulse: 78 78 83 80  Resp: '15 14 17 16  '$ Temp:      TempSrc:      SpO2: 94% 95% 99% 94%  Weight:      Height:       Physical Exam General: Orally intubated, NG tube Heart: RRR, no murmurs, rubs or gallops Lungs: CTA anteriorly, on vent Abdomen: Soft, non-distended, +BS Extremities: no edema b/l lower extremties Dialysis Access:  LUE AVF + bruit    Dialysis Orders: SW MWF 3h 87mn  80kg   400/800   2/2 bath  Hep 2000  LFA AVF - last HD 11/13, post 80.1kg - mircera 150 q2, last 11/13 - hectorol 490m IV q HD  Assessment/Plan: 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 SP 3% course of hypertonic fluids.  Uncont HTN - getting coreg/amlodipine per tube. 5kg up, UF 2-3 L Hyperkalemia - recurrent issue so TF's were changed to Nepro. Better.  ESRD: Usual MWF schedule, required CRRT 11/15-11/17/23, now back on iHD. HD today off schedule (passed on from yesterday), then HD tomorrow to get back on schedule. BUN high, access recirc suspected. Using temp cath placed 11/27 for now. IR consulted for fistulogram.  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 Secondary HPTH: CCa ok. Phos 8.3, started renvela per NG tube. Continue VDRA.  DM2 - per pmd   RobKelly SplinterD 09/07/2022, 11:57 AM  Recent Labs  Lab 09/06/22 1704 09/07/22 0512  HGB 8.6* 8.2*  ALBUMIN 2.5* 2.4*  CALCIUM 9.1 9.1  PHOS 8.1* 8.2*  CREATININE 8.13* 9.11*  K 4.9 4.6     Inpatient medications:  amLODipine  5 mg Per Tube BID   carvedilol  25 mg Per Tube BID WC   Chlorhexidine Gluconate Cloth  6 each Topical Daily   Chlorhexidine Gluconate Cloth  6 each Topical Q0600    Chlorhexidine Gluconate Cloth  6 each Topical Q0600   Chlorhexidine Gluconate Cloth  6 each Topical Q0600   darbepoetin (ARANESP) injection - DIALYSIS  150 mcg Subcutaneous Q Sun-1800   feeding supplement (PROSource TF20)  60 mL Per Tube BID   heparin injection (subcutaneous)  5,000 Units Subcutaneous Q8H   hydrALAZINE  50 mg Per Tube Q8H   insulin aspart  0-15 Units Subcutaneous Q4H   multivitamin  1 tablet Per Tube QHS   nutrition supplement (JUVEN)  1 packet Per Tube BID BM   mouth rinse  15 mL Mouth Rinse Q2H   pantoprazole (PROTONIX) IV  40 mg Intravenous Daily   sevelamer carbonate  2.4 g Per Tube TID WC    sodium chloride Stopped (08/30/22 0033)   feeding supplement (NEPRO CARB STEADY) 50 mL/hr at 09/07/22 0937   fentaNYL infusion INTRAVENOUS 100 mcg/hr (09/07/22 0937)   sodium chloride, acetaminophen (TYLENOL) oral liquid 160 mg/5 mL, bisacodyl, docusate, fentaNYL, hydrALAZINE, labetalol, lidocaine (PF), lidocaine-prilocaine, midazolam, ondansetron (ZOFRAN) IV, mouth rinse, pentafluoroprop-tetrafluoroeth, polyethylene glycol

## 2022-09-07 NOTE — Progress Notes (Signed)
Patient off fentanyl from [phone] today.  Patient exam did not change.  Patient moves all four extremities nonpurposefully and unable to track.  Patient easily stimulated and agitated by sounds and touch.  Patient frequently coughing over vent and thrashing in bed.  Attempted to use Fentanyl bolus x1, and versed x 1 prior to restarting fentanyl gtt. Patient restarted on fentanyl at lower rate.

## 2022-09-07 NOTE — Progress Notes (Signed)
[name] Progress Note Patient Name: Randy Ross DOB: Jul 02, 1971 MRN: [id number]   Date of Service  09/07/2022  HPI/Events of Note  P:t with large volume emesis, tube feed-looking material suctioned on ETT.  Otherwise stable, maintaining SpO2 98% on FiO2 30%  eICU Interventions  Tube feeds held, will give PRN zofran.  Will check CXR to further evaluate. Monitor off antibiotics for now.        Channah Godeaux M DELA CRUZ 09/07/2022, 9:37 PM

## 2022-09-07 NOTE — Progress Notes (Addendum)
STROKE TEAM PROGRESS NOTE  INTERVAL HISTORY Patient is seen in his room with no family at the bedside.  Goals of care discussions are ongoing, and family is waiting for an additional family member to arrive on Friday before making decisions.  Sedation has been decreased, but patient is still not waking up and able to follow commands.  He was noted to be hyponatremic at 127 this morning, will recheck sodium this afternoon.  Vitals:   09/07/22 1204 09/07/22 1300 09/07/22 1331 09/07/22 1400  BP: (!) 191/73 (!) 180/71 (!) 162/66 139/64  Pulse: 84 77  83  Resp: '14 14  20  '$ Temp: 98.2 F (36.8 C)     TempSrc:      SpO2: 96% 99%  93%  Weight: 83.9 kg     Height:       CBC:  Recent Labs  Lab 09/06/22 1704 09/07/22 0512  WBC 17.9* 17.7*  HGB 8.6* 8.2*  HCT 26.7* 25.1*  MCV 70.6* 69.3*  PLT PLATELET CLUMPS NOTED ON SMEAR, UNABLE TO ESTIMATE 690*    Basic Metabolic Panel:  Recent Labs  Lab 09/06/22 0503 09/06/22 1704 09/07/22 0512  NA 129* 128* 127*  K 4.4 4.9 4.6  CL 90* 89* 87*  CO2 '22 22 24  '$ GLUCOSE 126* 155* 138*  BUN 130* 148* 174*  CREATININE 7.12* 8.13* 9.11*  CALCIUM 9.1 9.1 9.1  MG 2.2  --  2.4  PHOS 8.0* 8.1* 8.2*    Lipid Panel:  No results for input(s): "CHOL", "TRIG", "HDL", "CHOLHDL", "VLDL", "LDLCALC" in the last 168 hours.  HgbA1c:  No results for input(s): "HGBA1C" in the last 168 hours.  Urine Drug Screen: No results for input(s): "LABOPIA", "COCAINSCRNUR", "LABBENZ", "AMPHETMU", "THCU", "LABBARB" in the last 168 hours.  Alcohol Level  No results for input(s): "ETH" in the last 168 hours.   IMAGING past 24 hours No results found.  PHYSICAL EXAM  Temp:  [97.5 F (36.4 C)-98.2 F (36.8 C)] 98.2 F (36.8 C) (11/28 1204) Pulse Rate:  [66-97] 83 (11/28 1400) Resp:  [9-20] 20 (11/28 1400) BP: (114-202)/(46-110) 139/64 (11/28 1400) SpO2:  [93 %-100 %] 93 % (11/28 1400) FiO2 (%):  [30 %] 30 % (11/28 1114) Weight:  [83.9 kg-85.8 kg] 83.9 kg (11/28  1204)  General - Well nourished, well developed, intubated on sedation with fentanyl only  Cardiovascular - Regular rate and rhythm.  Neuro - intubated on sedation, eyes open, not following commands.  PERRL, right gaze deviation, unable to cross midline, weak corneal reflexes bilaterally, inconsistently blinking bilaterally to threat. Cough present. Breathing over the vent.  Facial symmetry not able to test due to ET tube.  Tongue protrusion not cooperative.  responds to pain, slight withdrawal in all extremities. Moves all extremities spontaneously and not purposefully.   ASSESSMENT/PLAN [name] is a 51 y.o. male with history of end-stage renal disease on intermittent hemodialysis, diabetes, hypertension presented with  witnessed onset slurred speech, with headache at 10:30 PM for which EMS was activated.  On their arrival they report blood pressure was 260/140, glucose 126.  He was found to have a right sided cerebellar ICH.  Neurosurgery was consulted for possible EVD placement, but they have no plans for operative intervention at this point.  ICH remains stable on CT scan.  Patient has been receiving IHD.  MRI shows scattered small strokes in bilateral hemispheres, likely embolic.  Goals of care discussion with family is ongoing.  ICH: Acute right cerebellar small ICH, likely  due to chronic uncontrolled hypertension Stroke: embolic shower vs. bilateral watershed distribution, etiology unclear, could be cardioembolic versus global hypertension in the setting of sedation and HD Code Stroke CT head Acute intraparenchymal hematoma centered in the right cerebellar hemisphere with severe mass effect on the fourth ventricle and cerebral aqueduct. No hydrocephalus. CT repeat - hemorrhage size stable from earlier today. There is likely extension into the fourth ventricle but no intraventricular spread or hydrocephalus. CT head and neck premature atherosclerosis with notable vessel tortuosity  suggesting chronic hypertension CT repeat 11/16 stable hematoma and no hydrocephalus CT Head 11/17 1. Small acute infarct at the right corpus callosum body and cingulate white matter, not seen on admission head CT. 2. Non progressed right cerebellar hematoma and swelling. No hydrocephalus. MRI brain 11/21 no change in right cerebellar IPH, surrounding edema without hydrocephalus.  Multiple small infarcts in bilateral hemispheres, potentially embolic 2D Echo EF 50 to 55% EEG- This study showed generalized periodic discharges with triphasic morphology which is on the ictal-interictal continuum. However, the frequency, morphology and reactivity to stimulation is most likely indicative of toxic-metabolic causes. . Additionally there is moderate to severe diffuse encephalopathy, nonspecific etiology. No seizures were seen throughout the recording.  LDL 91 HgbA1c 5.9 VTE prophylaxis - SCD's No antithrombotic prior to admission, now on No antithrombotic.  Therapy recommendations:  pending Disposition:  pending, palliative care on board for [place] discussion  Cerebral Edema On 3% saline protocol -> off now due to fluid status Last NA 143->148->148 -> 144->137->131-129-> 127-> will recheck sodium postdialysis today Serial NA checks Q4h -> 23.4% PRN to keep Na goal around 150 11/18- 23.4% bolus ordered NSGY following - no surgical intervention at this time  Will allow Na to normalize, no further HTS boluses unless change in exam or worsening of cerebral edema on CT 11/23 CT stable ICH  ESRD on HD HD MWF Renal Dr. Jonnie Ross following, appreciate help Had HD catheter CRRT stopped 11/17 due to severe anemia -> transition to HD  Acute hypoxic Respiratory failure  Pneumonia:  Vent management per CCM  On Fentanyl for sedation  Versed as needed for agitation on vent Not candidate for extubation Trach aspirate - [place], strep and H.flu - on Ceftiaxone. Palliative care to help with GOC and decision  making. Family to decide on Monday for trach placement  Hypertension Home meds:  norvasc 10 mg, coreg 25 mg, imdur 30 mg, losartan 100 mg- on hold  Stable off Cleviprex Resume home norvasc and coreg SBP goal < 160 BP goal normotensive  Hyperlipidemia Home meds:  none LDL 91 goal < 70 Consider statin at discharge  Diabetes type II Controlled Home meds:  Semglee HgbA1c 5.9, goal < 7.0 CBGs SSI  dysphagia  OG tube and cortrak placed Start TF's   Leukocytosis  WBC 19.5->17.7 afebrile. Monitor   Chronic anemia  Hgb 8.7  Hospital day # 14   Patient seen and examined by NP/APP with MD. MD to update note as needed.   [place] , MSN, AGACNP-BC [facility] See Amion for schedule and pager information 09/07/2022 2:18 PM   ATTENDING NOTE: I reviewed above note and agree with the assessment and plan. Pt was seen and examined.   Pt still intubated and neuro unchanged, able to open eyes on voice but not tracking and not consistently blinking visual threat, not following commands. Still on fentanyl and precedex. Discussed with Dr. Lynetta Mare, will try to taper off fentanyl and may consider extubation trial.  Na 127, on HD, needed high Na HD. Repeat in pm Na 134. Will repeat CT in am. If CT reports worsening cerebral edema, will need hypertonic saline bolus. Family meeting with palliative care scheduled at 12/1  For detailed assessment and plan, please refer to above/below as I have made changes wherever appropriate.   Rosalin Hawking, MD PhD Stroke Neurology 09/07/2022 10:04 PM  This patient is critically ill due to [place], respiratory failure, encephalopathy and at significant risk of neurological worsening, death form hydrocephalus, brain herniation. This patient's care requires constant monitoring of vital signs, hemodynamics, respiratory and cardiac monitoring, review of multiple databases, neurological assessment, discussion with family, other specialists and  medical decision making of high complexity. I spent 35 minutes of neurocritical care time in the care of this patient. I discussed with Dr. Lynetta Mare    To contact Stroke Continuity provider, please refer to [email or web address] After hours, contact General Neurology

## 2022-09-07 NOTE — Progress Notes (Signed)
Nutrition Follow-up  DOCUMENTATION CODES:   Not applicable  INTERVENTION:   Tube feeding via Cortrak tube:  Nepro @ 50 ml/h (1200 ml per day) Prosource TF20 60 ml to BID  Provides 2320 kcal, 137 gm protein, 876 ml free water daily  Renal MVI   -1 packet Juven BID, each packet provides 95 calories, 2.5 grams of protein (collagen), and 9.8 grams of carbohydrate (3 grams sugar); also contains 7 grams of L-arginine and L-glutamine, 300 mg vitamin C, 15 mg vitamin E, 1.2 mcg vitamin B-12, 9.5 mg zinc, 200 mg calcium, and 1.5 g  Calcium Beta-hydroxy-Beta-methylbutyrate to support wound healing   NUTRITION DIAGNOSIS:   Inadequate oral intake related to inability to eat as evidenced by NPO status. Ongoing.   GOAL:   Patient will meet greater than or equal to 90% of their needs Met with TF at goal   MONITOR:   Vent status, Labs, Weight trends, TF tolerance, I & O's  REASON FOR ASSESSMENT:   Ventilator, Consult Enteral/tube feeding initiation and management  ASSESSMENT:   51 year old male who presented to the ED on 11/14 as a Code Stroke with AMS, L sided weakness, slurred speech, L sided gaze preference. PMH of ESRD on HD, DM, HTN. Pt required intubation in the ED. Pt admitted with ICH.  Pt discussed during ICU rounds and with RN.  Per MD/RN pt is not following commands. Pt remains on iHD. Per MD poor prognosis, ongoing [place] discussions  Palliative care following, family visit 12/1  11/14 - intubated 11/15 - s/p cortrak placement; tip at gastric antrum or duodenal bulb  11/15 - 11/17: CRRT 11/19 - started iHD  Medications reviewed and include: aranesp, SSI, rena-vit, juven, protonix, renvela TID    Labs reviewed: Na 127, PO4 8.2 CBG's: 135-212  UF: 2000 ml 11/28  Diet Order:   Diet Order             Diet NPO time specified  Diet effective now                   EDUCATION NEEDS:   No education needs have been identified at this time  Skin:  Skin  Assessment: Skin Integrity Issues: Skin Integrity Issues:: Stage II Stage II: sacrum  Last BM:  135 ml  Height:   Ht Readings from Last 1 Encounters:  08/25/22 _0  (1.651 m)    Weight:   Wt Readings from Last 1 Encounters:  09/07/22 83.9 kg    Ideal Body Weight:  61.8 kg  BMI:  Body mass index is 30.78 kg/m.  Estimated Nutritional Needs:   Kcal:  2100-2300  Protein:  130-150 grams  Fluid:  1000 ml + UOP  Ritika Hellickson P., RD, LDN, CNSC See AMiON for contact information

## 2022-09-07 NOTE — Progress Notes (Signed)
Patient intermittently will open eyes but not interactive.  Informed consent signed and in chart.   Treatment initiated: 0752 Treatment completed: 1201  Patient tolerated well. Patient hypertensive toward the end of treatment. No sign of acute distress.  Hand-off given to patient's nurse.   Access used: right temp HD catheter  Access issues: none   Total UF removed: 2023m Medication(s) given: heparin locked HD catheter Post HD VS: 98.2, 84, 14, 191/73, 96% vent Post HD weight: 83.9 kg   [facility]ialysis Unit

## 2022-09-08 ENCOUNTER — Inpatient Hospital Stay (HOSPITAL_COMMUNITY): Payer: No Typology Code available for payment source

## 2022-09-08 DIAGNOSIS — I6340 Cerebral infarction due to embolism of unspecified cerebral artery: Secondary | ICD-10-CM

## 2022-09-08 LAB — RENAL FUNCTION PANEL
Albumin: 2.4 g/dL — ABNORMAL LOW (ref 3.5–5.0)
Anion gap: 14 (ref 5–15)
BUN: 100 mg/dL — ABNORMAL HIGH (ref 6–20)
CO2: 25 mmol/L (ref 22–32)
Calcium: 8.9 mg/dL (ref 8.9–10.3)
Chloride: 90 mmol/L — ABNORMAL LOW (ref 98–111)
Creatinine, Ser: 5.78 mg/dL — ABNORMAL HIGH (ref 0.61–1.24)
GFR, Estimated: 11 mL/min — ABNORMAL LOW (ref >60)
Glucose, Bld: 136 mg/dL — ABNORMAL HIGH (ref 70–99)
Phosphorus: 5.6 mg/dL — ABNORMAL HIGH (ref 2.5–4.6)
Potassium: 4.3 mmol/L (ref 3.5–5.1)
Sodium: 129 mmol/L — ABNORMAL LOW (ref 135–145)

## 2022-09-08 LAB — CBC
HCT: 27.1 % — ABNORMAL LOW (ref 39.0–52.0)
Hemoglobin: 8.8 g/dL — ABNORMAL LOW (ref 13.0–17.0)
MCH: 22.9 pg — ABNORMAL LOW (ref 26.0–34.0)
MCHC: 32.5 g/dL (ref 30.0–36.0)
MCV: 70.6 fL — ABNORMAL LOW (ref 80.0–100.0)
Platelets: 798 10*3/uL — ABNORMAL HIGH (ref 150–400)
RBC: 3.84 MIL/uL — ABNORMAL LOW (ref 4.22–5.81)
RDW: 22.3 % — ABNORMAL HIGH (ref 11.5–15.5)
WBC: 19.9 10*3/uL — ABNORMAL HIGH (ref 4.0–10.5)
nRBC: 0.2 % (ref 0.0–0.2)

## 2022-09-08 LAB — GLUCOSE, CAPILLARY
Glucose-Capillary: 129 mg/dL — ABNORMAL HIGH (ref 70–99)
Glucose-Capillary: 133 mg/dL — ABNORMAL HIGH (ref 70–99)
Glucose-Capillary: 145 mg/dL — ABNORMAL HIGH (ref 70–99)
Glucose-Capillary: 166 mg/dL — ABNORMAL HIGH (ref 70–99)
Glucose-Capillary: 166 mg/dL — ABNORMAL HIGH (ref 70–99)
Glucose-Capillary: 167 mg/dL — ABNORMAL HIGH (ref 70–99)

## 2022-09-08 LAB — MAGNESIUM: Magnesium: 2.1 mg/dL (ref 1.7–2.4)

## 2022-09-08 LAB — TRIGLYCERIDES: Triglycerides: 73 mg/dL (ref <150)

## 2022-09-08 MED ORDER — SODIUM CHLORIDE 23.4 % INJECTION (4 MEQ/ML) FOR IV ADMINISTRATION
120.0000 meq | Freq: Once | INTRAVENOUS | Status: AC
  Administered 2022-09-08: 120 meq via INTRAVENOUS
  Filled 2022-09-08: qty 30

## 2022-09-08 MED ORDER — HEPARIN SODIUM (PORCINE) 1000 UNIT/ML DIALYSIS
2000.0000 [IU] | INTRAMUSCULAR | Status: AC | PRN
  Administered 2022-09-09: 2000 [IU] via INTRAVENOUS_CENTRAL

## 2022-09-08 MED ORDER — HEPARIN SODIUM (PORCINE) 1000 UNIT/ML IJ SOLN
INTRAMUSCULAR | Status: AC
  Administered 2022-09-08: 2000 [IU]
  Filled 2022-09-08: qty 5

## 2022-09-08 MED ORDER — HYDRALAZINE HCL 50 MG PO TABS
75.0000 mg | ORAL_TABLET | Freq: Three times a day (TID) | ORAL | Status: DC
  Administered 2022-09-09 – 2022-09-11 (×6): 75 mg
  Filled 2022-09-08 (×6): qty 1

## 2022-09-08 NOTE — Progress Notes (Signed)
NAMEJoell Ross, MRN:  [id number], DOB:  [birth year], LOS: 19 ADMISSION DATE:  08/24/2022, CONSULTATION DATE:  08/25/2022 REFERRING MD:  [place] - Neuro CHIEF COMPLAINT:  AMS   History of Present Illness:  51 year old man who presented to [facility] ED 11/14 with left sided visual preference, left sided weakness, slurred speech. He was also profoundly hypertensive with SBP over 200s (highest documented is SBP 230). PMHx significant for HTN, T2DM, ESRD on HD (MWF).  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 He was intubated in the ED and neurosurgery was consulted for consideration of EVD.  He was admitted by neurology and PCCM was called in consultation for vent management.  Pertinent Medical History:   Past Medical History:  Diagnosis Date   Anemia    CKD (chronic kidney disease)    on wed 12/09/21   Diabetes mellitus without complication ([place])    type 2   Hypertension    Renal disorder    Significant Hospital Events: Including procedures, antibiotic start and stop dates in addition to other pertinent events   11/15 - Admitted overnight. 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  CTA Head/Neck without e/o vascular lesion at R cerebellar hemorrhage. HTS 3% initiated. Trialysis catheter placed L IJ for CRRT/HTS 23% (per Stroke team). CRRT initiated. 11/16 Low grade temp with Tmax 99.4, WBC uptrending 25.9 (9.0). CXR with increased RLL opacities. Cefepime initiated. Resp Cx/TA > mult spec h.flu, strep species, klebsiella species. TG elevated, weaning prop. 11/17 Ongoing low grade temps Tmax 100.73F. WBC downtrending 19.3 (25.9). CRRT filter clotting causing blood loss, Hgb 6.3. CRRT stopped, 1U PRBCs transfused. HD to resume. Repeat CT Head with small acute infarct at R corpus  callosum body/cingulate white matter, stable R cerebellar hematoma. CXR improved on cefepime. 11/19 stirs to touch 11/24 meeting with palliative care, neurology and [facility].  Family still discussing options among themselves. 11/27 temporary HD catheter placed as fistula not functioning.   Interim History / Subjective:   Still moving all limbs purposefully, not following commands. Minimal secretions.  Objective:  Blood pressure (!) 151/65, pulse 80, temperature 98.4 F (36.9 C), temperature source Axillary, resp. rate 14, height '5\' 5"'$  (1.651 m), weight 83.9 kg, SpO2 98 %.    Vent Mode: PSV;CPAP FiO2 (%):  [30 %] 30 % Set Rate:  [16 bmp] 16 bmp Vt Set:  [460 mL-490 mL] 460 mL PEEP:  [5 cmH20] 5 cmH20 Pressure Support:  [5 cmH20] 5 cmH20 Plateau Pressure:  [7 cmH20-18 cmH20] 7 cmH20   Intake/Output Summary (Last 24 hours) at 09/08/2022 0855 Last data filed at 09/08/2022 0700 Gross per 24 hour  Intake 1614.77 ml  Output 2300 ml  Net -685.23 ml    Filed Weights   09/03/22 1446 09/07/22 0730 09/07/22 1204  Weight: 82.2 kg 85.8 kg 83.9 kg   Physical Examination: Blood pressure (!) 140/60, pulse 71, temperature 98.5 F (36.9 C), temperature source Axillary, resp. rate 16, height '5\' 5"'$  (1.651 m), weight 82.2 kg, SpO2 99 %. Gen:      No acute distress HEENT:  EOMI, sclera anicteric, ET tube Neck:     No masses; no thyromegaly Lungs:    Clear to auscultation bilaterally; normal respiratory effort CV:         Regular rate and  rhythm; no murmurs Abd:      + bowel sounds; soft, non-tender; no palpable masses, no distension Ext:    No edema; adequate peripheral perfusion Skin:      Warm and dry; no rash Neuro: Eyes open, moves purposefully in all 4 limbs    Labs/imaging personally reviewed:  CT head 11/4 > 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 CTA head 11/14 > no e/o vascular  lesion at R cerebellar hemorrhage MRI brain 11/21 > no significant changes, many small acute infarcts Creatinine 7.12  Assessment & Plan:   Acute IPH with severe mass effect/brain swelling - presumed hypertensive in nature Repeat CT Head with small acute infarct at R corpus callosum body/cingulate white matter, stable R cerebellar hematoma. Hypertensive emergency - hx of HTN, unsure of compliance with med regimen Neurology thinking encephalopathy is out of proportion to [employer] - but patient remains on narcotics.  - Continue frequent neurochecks - Continue Coreg, amlodipine, hydralazine - Stroke service primary - AC/VTE ppx per primary - Frequent neuro checks.  - D/c losartan 11/22 given hyperkalemia. - PRN hydralazine, labetalol.   Acute hypoxic respiratory failure with inability to protect the airway - s/p intubation in ED PNA RLL due to klebsiella, strep, h.flu Finished 7 days of antibiotics on 11/22 - Continue breathing trials as tolerated - Follow intermittent chest x-ray - Really need to see how much mental status will improve off all sedation and after HD.  - At this point, likely that patient has sufficient airway protective reflexes for extubation.  Will discuss with family.  Hx ESRD on HD MWF Hyperkalemia  - Dialysis per nephrology.  Temporary HD cath has been removed. - Will need AV fistulogram for difficulties during dialysis. - Will place temporary HD catheter.   Anemia 2/2 CKD and acute blood loss associated with CRRT, now on HD - Follow CBC, transfuse for hemoglobin less than 7  DM2 with hyperglycemia - SSI coverage  At risk for malnutrition - Continue tube feeds  Goals of care - Family meeting on 11/24.  There is no consensus on the plan forward order tracheostomy.  Family wants the weekend to talk amongst themselves and decide. Also need clean off sedation examination  Best practice (evaluated daily):   Diet/type: tubefeeds DVT prophylaxis: not indicated GI  prophylaxis: PPI Lines: N/A Foley:  N/A Code Status:  full code Last date of multidisciplinary goals of care discussion: Per primary.  CRITICAL CARE Performed by: Kipp Brood   Total critical care time: 35 minutes  Critical care time was exclusive of separately billable procedures and treating other patients.  Critical care was necessary to treat or prevent imminent or life-threatening deterioration.  Critical care was time spent personally by me on the following activities: development of treatment plan with patient and/or surrogate as well as nursing, discussions with consultants, evaluation of patient's response to treatment, examination of patient, obtaining history from patient or surrogate, ordering and performing treatments and interventions, ordering and review of laboratory studies, ordering and review of radiographic studies, pulse oximetry, re-evaluation of patient's condition and participation in multidisciplinary rounds.  Kipp Brood, MD [facility] ICU Physician [place]  Pager: [phone] Mobile: [phone] After hours: [phone].

## 2022-09-08 NOTE — Progress Notes (Signed)
Palliative:  Chart reviewed.  Discussed with RN at patient's bedside - more alert, nonpurposeful movements today. No family at bedside.   Per chart review family awaiting arrival of other family members 12/1 prior to making decisions.  PMT will continue to follow along.  Juel Burrow, DNP, AGNP-C Palliative Medicine Team Team Phone # [phone]  Pager # [phone]  NO CHARGE

## 2022-09-08 NOTE — Progress Notes (Signed)
STROKE TEAM PROGRESS NOTE  INTERVAL HISTORY No family is at the bedside. Pt still intubated on and off fentanyl, but eyes open spontaneously, more tracking than before but not very consistent, blinking to visual threat bilaterally, overall mental status is improving.   Vitals:   09/08/22 0755 09/08/22 0800 09/08/22 0830 09/08/22 0900  BP:  (!) 150/62 (!) 155/67 (!) 161/66  Pulse: 80 74 74 78  Resp: '14 14 16 13  '$ Temp:  98.4 F (36.9 C)    TempSrc:  Axillary    SpO2: 98% 99% 99% 97%  Weight:      Height:       CBC:  Recent Labs  Lab 09/07/22 0512 09/08/22 0628  WBC 17.7* 19.9*  HGB 8.2* 8.8*  HCT 25.1* 27.1*  MCV 69.3* 70.6*  PLT 690* 937*   Basic Metabolic Panel:  Recent Labs  Lab 09/07/22 0512 09/07/22 1438 09/08/22 0628  NA 127* 134* 129*  K 4.6 5.0 4.3  CL 87* 93* 90*  CO2 '24 25 25  '$ GLUCOSE 138* 178* 136*  BUN 174* 64* 100*  CREATININE 9.11* 4.11* 5.78*  CALCIUM 9.1 9.4 8.9  MG 2.4  --  2.1  PHOS 8.2*  --  5.6*   Lipid Panel:  Recent Labs  Lab 09/08/22 0628  TRIG 73   HgbA1c:  No results for input(s): "HGBA1C" in the last 168 hours.  Urine Drug Screen: No results for input(s): "LABOPIA", "COCAINSCRNUR", "LABBENZ", "AMPHETMU", "THCU", "LABBARB" in the last 168 hours.  Alcohol Level  No results for input(s): "ETH" in the last 168 hours.   IMAGING past 24 hours CT HEAD WO CONTRAST (5MM)  Result Date: 09/08/2022 CLINICAL DATA:  Hemorrhagic stroke follow-up EXAM: CT HEAD WITHOUT CONTRAST TECHNIQUE: Contiguous axial images were obtained from the base of the skull through the vertex without intravenous contrast. RADIATION DOSE REDUCTION: This exam was performed according to the departmental dose-optimization program which includes automated exposure control, adjustment of the mA and/or kV according to patient size and/or use of iterative reconstruction technique. COMPARISON:  Six days ago FINDINGS: Brain: Continued decreased conspicuity of high-density clot in  the right cerebellum. A rim of edema appears similar to prior. Lower fourth ventricular narrowing without hydrocephalus. No new hemorrhage or infarct. Vascular: Atheromatous calcification and tortuosity peer Skull: No acute or aggressive finding Sinuses/Orbits: Extensive paranasal sinus and mastoid opacification in the setting of intubation. No acute orbital finding. IMPRESSION: 1. Expected evolution of right cerebellar hematoma. No hydrocephalus or new abnormality. 2. Extensive sinus and mastoid opacification in the setting of intubation. Electronically Signed   By: Jorje Guild M.D.   On: 09/08/2022 06:00   DG CHEST PORT 1 VIEW  Result Date: 09/07/2022 CLINICAL DATA:  Shortness of breath EXAM: PORTABLE CHEST 1 VIEW COMPARISON:  09/06/2022 FINDINGS: Cardiac shadow is enlarged but stable. Endotracheal tube and feeding catheter are again noted and stable. Right jugular temporary dialysis catheter is seen. No pneumothorax is noted. No focal infiltrate or effusion is noted. IMPRESSION: No acute abnormality noted. Electronically Signed   By: Inez Catalina M.D.   On: 09/07/2022 21:59    PHYSICAL EXAM  Temp:  [98 F (36.7 C)-99.5 F (37.5 C)] 98.4 F (36.9 C) (11/29 0800) Pulse Rate:  [68-95] 78 (11/29 0900) Resp:  [9-23] 13 (11/29 0900) BP: (92-202)/(55-119) 161/66 (11/29 0900) SpO2:  [93 %-100 %] 97 % (11/29 0900) FiO2 (%):  [30 %] 30 % (11/29 0800) Weight:  [83.9 kg] 83.9 kg (11/28 1204)  General -  Well nourished, well developed, intubated with fentanyl on and off  Cardiovascular - Regular rate and rhythm.  Neuro - intubated on sedation, eyes open, not following commands.  PERRL, inconsistently tracking bilaterally, weak corneal reflexes bilaterally, blinking bilaterally to threat. Breathing over the vent, on weaning.  Facial symmetry not able to test due to ET tube.  Tongue protrusion not cooperative.  Moves all extremities spontaneously and symmetrical at BLEs but seems stronger at RUE than  LUE.   ASSESSMENT/PLAN [name] is a 51 y.o. male with history of end-stage renal disease on intermittent hemodialysis, diabetes, hypertension presented with  witnessed onset slurred speech, with headache at 10:30 PM for which EMS was activated.  On their arrival they report blood pressure was 260/140, glucose 126.  He was found to have a right sided cerebellar ICH.    ICH: Acute right cerebellar small ICH, likely due to chronic uncontrolled hypertension. However, given the embolic infarcts on MRI, hemorrhagic conversion at right cerebellar can not be ruled out Code Stroke CT head Acute ICH at the right cerebellar hemisphere with severe mass effect on the fourth ventricle and cerebral aqueduct. No hydrocephalus. CT repeat x 3 stable hematoma, no intraventricular spread or hydrocephalus. CT head and neck premature atherosclerosis with notable vessel tortuosity suggesting chronic hypertension 2D Echo EF 50 to 55% EEG- most likely toxic-metabolic moderate to severe diffuse encephalopathy. No seizures LDL 91 HgbA1c 5.9 VTE prophylaxis - heparin subq No antithrombotic prior to admission, now on No antithrombotic.  Therapy recommendations:  pending Disposition:  pending, palliative care on board for [place] discussion  Cerebral Edema On 3% saline protocol -> off now due to fluid status Last NA 143->148->148 -> 144->137->131-129-> 127->134->129 23.4% bolus ordered 11/29 11/23 CT stable ICH Na goal normotensive  Stroke: embolic shower vs. bilateral watershed distribution, etiology unclear, could be cardioembolic versus global hypertension in the setting of sedation and HD MRI brain 11/21 no change in right cerebellar IPH, surrounding edema without hydrocephalus.  Multiple small infarcts in bilateral hemispheres, potentially embolic Questionable whether cerebellar ICH was hemorrhagic conversion Consider cardiac monitoring if neuro improves later  ESRD on HD HD MWF Renal Dr. Jonnie Finner  following, appreciate help Had HD catheter CRRT stopped 11/17 due to severe anemia -> transition to HD  Acute hypoxic Respiratory failure  Pneumonia:  Vent management per CCM  On Fentanyl for sedation  Versed as needed for agitation on vent Not candidate for extubation Trach aspirate - Klebseilla, strep and H.flu - off Ceftiaxone. Palliative care to help with GOC and decision making.   Hypertension Home meds:  norvasc 10 mg, coreg 25 mg, imdur 30 mg, losartan 100 mg- on hold  Stable off Cleviprex Resume home norvasc and coreg SBP goal < 160 BP goal normotensive  Hyperlipidemia Home meds:  none LDL 91 goal < 70 Consider statin at discharge  Diabetes type II Controlled Home meds:  Semglee HgbA1c 5.9, goal < 7.0 CBGs SSI  dysphagia  OG tube and cortrak placed Start TF's   Leukocytosis  WBC 19.5->17.7->19.9 afebrile. Monitor  CCM on board  Chronic anemia thormbocytosis  Hgb 8.7 Platelet 690->798  Hospital day # 15    Rosalin Hawking, MD PhD Stroke Neurology 09/08/2022 10:26 AM  This patient is critically ill due to [place], respiratory failure, encephalopathy and at significant risk of neurological worsening, death form hydrocephalus, brain herniation. This patient's care requires constant monitoring of vital signs, hemodynamics, respiratory and cardiac monitoring, review of multiple databases, neurological assessment, discussion with family, other  specialists and medical decision making of high complexity. I spent 35 minutes of neurocritical care time in the care of this patient. I discussed with Dr. Lynetta Mare    To contact Stroke Continuity provider, please refer to [email or web address] After hours, contact General Neurology

## 2022-09-08 NOTE — Progress Notes (Signed)
  [facility] Progress Note   Subjective:   seen in room. Not responding and not following commands.   Objective Vitals:   09/08/22 1530 09/08/22 1600 09/08/22 1630 09/08/22 1700  BP: 126/67 (!) 144/64 (!) 146/63 (!) 166/80  Pulse: 78 80 84 84  Resp: 20 (!) '21 20 14  '$ Temp:  98.3 F (36.8 C)    TempSrc:  Axillary    SpO2: 97% 96% 95% 99%  Weight:      Height:       Physical Exam General: Orally intubated, NG tube Heart: RRR, no murmurs, rubs or gallops Lungs: CTA anteriorly, on vent Abdomen: Soft, non-distended, +BS Extremities: no edema b/l lower extremties Dialysis Access:  LUE AVF + bruit    Dialysis Orders: SW MWF 3h 52mn  80kg   400/800   2/2 bath  Hep 2000  LFA AVF - last HD 11/13, post 80.1kg - mircera 150 q2, last 11/13 - hectorol 473m IV q HD  Assessment/Plan: 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 SP 3% course of hypertonic fluids.  Uncont HTN - getting coreg/amlodipine per tube. 5kg up, UF 2-3 L Hyperkalemia - recurrent issue so TF's were changed to Nepro. Better.  ESRD: Usual MWF schedule, required CRRT 11/15-11/17/23, now back on iHD. HD today on schedule. BUN high, access recirc suspected > is now better using temp cath placed 11/27. IR consulted for fistulogram.  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 Secondary HPTH: CCa ok. Phos 8.3, started renvela per NG tube. Continue VDRA.  DM2 - per pmd  GOC - full code  RobKelly SplinterD 09/08/2022, 5:48 PM  Recent Labs  Lab 09/07/22 0512 09/07/22 1438 09/08/22 0628  HGB 8.2*  --  8.8*  ALBUMIN 2.4*  --  2.4*  CALCIUM 9.1 9.4 8.9  PHOS 8.2*  --  5.6*  CREATININE 9.11* 4.11* 5.78*  K 4.6 5.0 4.3     Inpatient medications:  amLODipine  5 mg Per Tube BID   carvedilol  25 mg Per Tube BID WC   Chlorhexidine Gluconate Cloth  6 each Topical Q0600   darbepoetin (ARANESP) injection - DIALYSIS   150 mcg Subcutaneous Q Sun-1800   feeding supplement (PROSource TF20)  60 mL Per Tube BID   heparin injection (subcutaneous)  5,000 Units Subcutaneous Q8H   hydrALAZINE  75 mg Per Tube Q8H   insulin aspart  0-15 Units Subcutaneous Q4H   multivitamin  1 tablet Per Tube QHS   nutrition supplement (JUVEN)  1 packet Per Tube BID BM   mouth rinse  15 mL Mouth Rinse Q2H   pantoprazole (PROTONIX) IV  40 mg Intravenous Daily   sevelamer carbonate  2.4 g Per Tube TID WC    sodium chloride Stopped (08/30/22 0033)   feeding supplement (NEPRO CARB STEADY) 1,000 mL (09/08/22 1733)   fentaNYL infusion INTRAVENOUS Stopped (09/08/22 1646)   sodium chloride, acetaminophen (TYLENOL) oral liquid 160 mg/5 mL, bisacodyl, docusate, fentaNYL, hydrALAZINE, labetalol, lidocaine (PF), lidocaine-prilocaine, midazolam, ondansetron (ZOFRAN) IV, mouth rinse, pentafluoroprop-tetrafluoroeth, polyethylene glycol

## 2022-09-09 DIAGNOSIS — Z7189 Other specified counseling: Secondary | ICD-10-CM

## 2022-09-09 DIAGNOSIS — Z515 Encounter for palliative care: Secondary | ICD-10-CM

## 2022-09-09 LAB — MAGNESIUM: Magnesium: 1.8 mg/dL (ref 1.7–2.4)

## 2022-09-09 LAB — CBC
HCT: 28.4 % — ABNORMAL LOW (ref 39.0–52.0)
Hemoglobin: 9.1 g/dL — ABNORMAL LOW (ref 13.0–17.0)
MCH: 22.6 pg — ABNORMAL LOW (ref 26.0–34.0)
MCHC: 32 g/dL (ref 30.0–36.0)
MCV: 70.6 fL — ABNORMAL LOW (ref 80.0–100.0)
Platelets: 765 10*3/uL — ABNORMAL HIGH (ref 150–400)
RBC: 4.02 MIL/uL — ABNORMAL LOW (ref 4.22–5.81)
RDW: 22.5 % — ABNORMAL HIGH (ref 11.5–15.5)
WBC: 16.1 10*3/uL — ABNORMAL HIGH (ref 4.0–10.5)
nRBC: 0.1 % (ref 0.0–0.2)

## 2022-09-09 LAB — GLUCOSE, CAPILLARY
Glucose-Capillary: 125 mg/dL — ABNORMAL HIGH (ref 70–99)
Glucose-Capillary: 163 mg/dL — ABNORMAL HIGH (ref 70–99)
Glucose-Capillary: 166 mg/dL — ABNORMAL HIGH (ref 70–99)
Glucose-Capillary: 180 mg/dL — ABNORMAL HIGH (ref 70–99)
Glucose-Capillary: 206 mg/dL — ABNORMAL HIGH (ref 70–99)
Glucose-Capillary: 247 mg/dL — ABNORMAL HIGH (ref 70–99)

## 2022-09-09 LAB — RENAL FUNCTION PANEL
Albumin: 2.6 g/dL — ABNORMAL LOW (ref 3.5–5.0)
Anion gap: 12 (ref 5–15)
BUN: 51 mg/dL — ABNORMAL HIGH (ref 6–20)
CO2: 28 mmol/L (ref 22–32)
Calcium: 9.1 mg/dL (ref 8.9–10.3)
Chloride: 93 mmol/L — ABNORMAL LOW (ref 98–111)
Creatinine, Ser: 3.88 mg/dL — ABNORMAL HIGH (ref 0.61–1.24)
GFR, Estimated: 18 mL/min — ABNORMAL LOW (ref >60)
Glucose, Bld: 178 mg/dL — ABNORMAL HIGH (ref 70–99)
Phosphorus: 3.7 mg/dL (ref 2.5–4.6)
Potassium: 3.4 mmol/L — ABNORMAL LOW (ref 3.5–5.1)
Sodium: 133 mmol/L — ABNORMAL LOW (ref 135–145)

## 2022-09-09 MED ORDER — ORAL CARE MOUTH RINSE: 15.0000 mL | OROMUCOSAL | Status: DC | PRN

## 2022-09-09 MED ORDER — FENTANYL CITRATE PF 50 MCG/ML IJ SOSY
50.0000 ug | PREFILLED_SYRINGE | INTRAMUSCULAR | Status: DC | PRN
  Administered 2022-09-09 – 2022-09-14 (×6): 50 ug via INTRAVENOUS
  Filled 2022-09-09 (×6): qty 1

## 2022-09-09 MED ORDER — CHLORHEXIDINE GLUCONATE CLOTH 2 % EX PADS
6.0000 | MEDICATED_PAD | Freq: Every day | CUTANEOUS | Status: DC
  Administered 2022-09-09: 6 via TOPICAL

## 2022-09-09 MED ORDER — ORAL CARE MOUTH RINSE: 15.0000 mL | OROMUCOSAL | Status: DC

## 2022-09-09 MED ORDER — DEXAMETHASONE SODIUM PHOSPHATE 10 MG/ML IJ SOLN
10.0000 mg | Freq: Once | INTRAMUSCULAR | Status: AC
  Administered 2022-09-09: 10 mg via INTRAVENOUS
  Filled 2022-09-09: qty 1

## 2022-09-09 NOTE — Progress Notes (Signed)
This RN went into the room to re-position patient and he had self-extubated. Mother and Brother were at bedside. Ventilator was not alarming on arrival to the room. RT and Dr. Lynetta Mare made aware. Pt is on 3L nasal cannula with sat of 98%. RN will continue to monitor.

## 2022-09-09 NOTE — Progress Notes (Signed)
NAMESimran Ross, MRN:  [id number], DOB:  Apr 21, 1971, LOS: 94 ADMISSION DATE:  08/24/2022, CONSULTATION DATE:  08/25/2022 REFERRING MD:  [place] - Neuro CHIEF COMPLAINT:  AMS   History of Present Illness:  51 year old man who presented to [facility] ED 11/14 with left sided visual preference, left sided weakness, slurred speech. He was also profoundly hypertensive with SBP over 200s (highest documented is SBP 230). PMHx significant for HTN, T2DM, ESRD on HD (MWF).  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 He was intubated in the ED and neurosurgery was consulted for consideration of EVD.  He was admitted by neurology and PCCM was called in consultation for vent management.  Pertinent Medical History:   Past Medical History:  Diagnosis Date   Anemia    CKD (chronic kidney disease)    on wed 12/09/21   Diabetes mellitus without complication ([place])    type 2   Hypertension    Renal disorder    Significant Hospital Events: Including procedures, antibiotic start and stop dates in addition to other pertinent events   11/15 - Admitted overnight. 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  CTA Head/Neck without e/o vascular lesion at R cerebellar hemorrhage. HTS 3% initiated. Trialysis catheter placed L IJ for CRRT/HTS 23% (per Stroke team). CRRT initiated. 11/16 Low grade temp with Tmax 99.4, WBC uptrending 25.9 (9.0). CXR with increased RLL opacities. Cefepime initiated. Resp Cx/TA > mult spec h.flu, strep species, klebsiella species. TG elevated, weaning prop. 11/17 Ongoing low grade temps Tmax 100.108F. WBC downtrending 19.3 (25.9). CRRT filter clotting causing blood loss, Hgb 6.3. CRRT stopped, 1U PRBCs transfused. HD to resume. Repeat CT Head with small acute infarct at R corpus  callosum body/cingulate white matter, stable R cerebellar hematoma. CXR improved on cefepime. 11/19 stirs to touch 11/24 meeting with palliative care, neurology and [facility].  Family still discussing options among themselves. 11/27 temporary HD catheter placed as fistula not functioning.   Interim History / Subjective:   Still moving all limbs purposefully, following simple commands intermittently per RN. Minimal secretions.  Objective:  Blood pressure (!) 158/79, pulse 78, temperature 98.4 F (36.9 C), temperature source Axillary, resp. rate (!) 24, height '5\' 5"'$  (1.651 m), weight 78.5 kg, SpO2 100 %.    Vent Mode: PRVC FiO2 (%):  [30 %] 30 % Set Rate:  [16 bmp] 16 bmp Vt Set:  [490 mL] 490 mL PEEP:  [5 cmH20] 5 cmH20 Pressure Support:  [5 cmH20] 5 cmH20 Plateau Pressure:  [6 cmH20-13 cmH20] 6 cmH20   Intake/Output Summary (Last 24 hours) at 09/09/2022 0917 Last data filed at 09/09/2022 0800 Gross per 24 hour  Intake 1620.89 ml  Output 3500 ml  Net -1879.11 ml    Filed Weights   09/07/22 1204 09/08/22 2000 09/09/22 0014  Weight: 83.9 kg 81.8 kg 78.5 kg   Physical Examination: Blood pressure (!) 140/60, pulse 71, temperature 98.5 F (36.9 C), temperature source Axillary, resp. rate 16, height '5\' 5"'$  (1.651 m), weight 82.2 kg, SpO2 99 %. Gen:      No acute distress HEENT:  EOMI, sclera anicteric, ET tube Neck:     No masses; no thyromegaly Lungs:    Clear to auscultation bilaterally; normal respiratory effort, strong cough.  CV:  Regular rate and rhythm; no murmurs Abd:      + bowel sounds; soft, non-tender; no palpable masses, no distension Ext:    No edema; adequate peripheral perfusion Skin:      Warm and dry; no rash Neuro: Eyes open, moves purposefully in all 4 limbs, follows some simple commands.     Labs/imaging personally reviewed:  CT head 11/4 > acute IPH centered in right cerebellar hemisphere with severe mass effect on the 4th ventricle and cerebral  aqueduct; though, no hydrocephalus. CTA head 11/14 > no e/o vascular lesion at R cerebellar hemorrhage MRI brain 11/21 > no significant changes, many small acute infarcts Creatinine 7.12  Assessment & Plan:   Acute IPH with severe mass effect/brain swelling - presumed hypertensive in nature Repeat CT Head with small acute infarct at R corpus callosum body/cingulate white matter, stable R cerebellar hematoma. Hypertensive emergency - hx of HTN, unsure of compliance with med regimen - Continue Coreg, amlodipine, hydralazine - PRN hydralazine, labetalol.   Acute hypoxic respiratory failure with inability to protect the airway - s/p intubation in ED PNA RLL due to klebsiella, strep, h.flu Finished 7 days of antibiotics on 11/22 -Has tolerated SBT's consistently -Mental status has improved and he appears to have sufficient airway protective reflexes for successful extubation. -Will hold on extubating until all family is present 12/1 as per prior discussions with palliative care.  Hx ESRD on HD MWF - Dialysis per nephrology.  Temporary HD cath has been removed. - Will need AV fistulogram for difficulties during dialysis. - Will place temporary HD catheter.   Anemia 2/2 CKD and acute blood loss associated with CRRT, now on HD - Follow CBC, transfuse for hemoglobin less than 7  DM2 with hyperglycemia - SSI coverage  At risk for malnutrition - Continue tube feeds  Goals of care - Family meeting on 11/24.  The family was asked to consider possible tracheostomy at that time and they were left to discuss amongst themselves.  Neurological status has since improved with time and removal of sedation making the need for a tracheostomy far less likely.  Best practice (evaluated daily):   Diet/type: tubefeeds DVT prophylaxis: Heparin tid.  GI prophylaxis: PPI Lines: N/A Foley:  N/A Code Status:  full code Last date of multidisciplinary goals of care discussion: Scheduled for 12/1  CRITICAL  CARE Performed by: Kipp Brood   Total critical care time: 35 minutes  Critical care time was exclusive of separately billable procedures and treating other patients.  Critical care was necessary to treat or prevent imminent or life-threatening deterioration.  Critical care was time spent personally by me on the following activities: development of treatment plan with patient and/or surrogate as well as nursing, discussions with consultants, evaluation of patient's response to treatment, examination of patient, obtaining history from patient or surrogate, ordering and performing treatments and interventions, ordering and review of laboratory studies, ordering and review of radiographic studies, pulse oximetry, re-evaluation of patient's condition and participation in multidisciplinary rounds.  Kipp Brood, MD [facility] ICU Physician [place]  Pager: [phone] Mobile: [phone] After hours: [phone].

## 2022-09-09 NOTE — Progress Notes (Signed)
  [facility] Progress Note   Subjective:   seen in room. Responding to commands, off sedation now and coughing well. BUN down to 50's.   Objective Vitals:   09/09/22 0900 09/09/22 1000 09/09/22 1100 09/09/22 1200  BP: (!) 158/74 (!) 161/78 (!) 144/77 (!) 173/80  Pulse: 79 80 77 69  Resp: '15  20 13  '$ Temp:      TempSrc:      SpO2: 100% 99% 100% 99%  Weight:      Height:       Physical Exam General: Orally intubated, NG tube Heart: RRR, no murmurs, rubs or gallops Lungs: CTA anteriorly, on vent Abdomen: Soft, non-distended, +BS Extremities: no edema b/l lower extremties Dialysis Access:  LUE AVF + bruit    Dialysis Orders: SW MWF 3h 56mn  80kg   400/800   2/2 bath  Hep 2000  LFA AVF - last HD 11/13, post 80.1kg - mircera 150 q2, last 11/13 - hectorol 468m IV q HD  Assessment/Plan: 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 SP course of hypertonic fluids. Na down to 129 yest, got 1 dose 24.3% saline. Neurology requests [place] try to keep Na+ in normal range (135- 145) for now. Will use high Na+ 145 w/ HD until neurology says no longer needed.  Uncont HTN - getting coreg/amlodipine per tube.  Hyperkalemia - recurrent issue so TF's were changed to Nepro. Resolved.  AMS - more responsive off of sedating meds. Also BUN down 50s today.  ESRD: Usual MWF schedule, required CRRT 11/15-11/17/23, now back on iHD. HD today on schedule.  HD access - BUN was very high so access recirc was suspected. Using temp HD cath placed 11/27 and BUN better. IR consulted for fistulogram.  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 Secondary HPTH: CCa ok. Phos 8.3, started renvela per NG tube. Continue VDRA.  DM2 - per pmd  GOC - full code  RobKelly SplinterD 09/09/2022, 12:18 PM  Recent Labs  Lab 09/08/22 0628 09/09/22 0549  HGB 8.8* 9.1*  ALBUMIN 2.4* 2.6*  CALCIUM 8.9 9.1  PHOS 5.6*  3.7  CREATININE 5.78* 3.88*  K 4.3 3.4*     Inpatient medications:  amLODipine  5 mg Per Tube BID   carvedilol  25 mg Per Tube BID WC   Chlorhexidine Gluconate Cloth  6 each Topical Q0600   darbepoetin (ARANESP) injection - DIALYSIS  150 mcg Subcutaneous Q Sun-1800   feeding supplement (PROSource TF20)  60 mL Per Tube BID   heparin injection (subcutaneous)  5,000 Units Subcutaneous Q8H   hydrALAZINE  75 mg Per Tube Q8H   insulin aspart  0-15 Units Subcutaneous Q4H   multivitamin  1 tablet Per Tube QHS   nutrition supplement (JUVEN)  1 packet Per Tube BID BM   mouth rinse  15 mL Mouth Rinse Q2H   pantoprazole (PROTONIX) IV  40 mg Intravenous Daily   sevelamer carbonate  2.4 g Per Tube TID WC    sodium chloride Stopped (08/30/22 0033)   feeding supplement (NEPRO CARB STEADY) 50 mL/hr at 09/09/22 1200   sodium chloride, acetaminophen (TYLENOL) oral liquid 160 mg/5 mL, bisacodyl, docusate, fentaNYL (SUBLIMAZE) injection, hydrALAZINE, labetalol, lidocaine (PF), lidocaine-prilocaine, ondansetron (ZOFRAN) IV, mouth rinse, pentafluoroprop-tetrafluoroeth, polyethylene glycol

## 2022-09-09 NOTE — Progress Notes (Addendum)
STROKE TEAM PROGRESS NOTE  INTERVAL HISTORY No family is at the bedside. Pt still intubated on and off fentanyl, but eyes open spontaneously, more tracking than before but not very consistent, blinking to visual threat bilaterally, overall mental status is improving.  Vitals:   09/09/22 1235 09/09/22 1300 09/09/22 1400 09/09/22 1500  BP: (!) 173/80 (!) 163/73 (!) 168/84 (!) 168/73  Pulse: 74 80 83 96  Resp: '19 15 18 16  '$ Temp:      TempSrc:      SpO2: 100% 99% 96% 94%  Weight:      Height:       CBC:  Recent Labs  Lab 09/08/22 0628 09/09/22 0549  WBC 19.9* 16.1*  HGB 8.8* 9.1*  HCT 27.1* 28.4*  MCV 70.6* 70.6*  PLT 798* 765*    Basic Metabolic Panel:  Recent Labs  Lab 09/08/22 0628 09/09/22 0549  NA 129* 133*  K 4.3 3.4*  CL 90* 93*  CO2 25 28  GLUCOSE 136* 178*  BUN 100* 51*  CREATININE 5.78* 3.88*  CALCIUM 8.9 9.1  MG 2.1 1.8  PHOS 5.6* 3.7    Lipid Panel:  Recent Labs  Lab 09/08/22 0628  TRIG 73    HgbA1c:  No results for input(s): "HGBA1C" in the last 168 hours.  Urine Drug Screen: No results for input(s): "LABOPIA", "COCAINSCRNUR", "LABBENZ", "AMPHETMU", "THCU", "LABBARB" in the last 168 hours.  Alcohol Level  No results for input(s): "ETH" in the last 168 hours.   IMAGING past 24 hours No results found.  PHYSICAL EXAM  Temp:  [96.9 F (36.1 C)-98.4 F (36.9 C)] 96.9 F (36.1 C) (11/30 1200) Pulse Rate:  [69-96] 96 (11/30 1500) Resp:  [13-24] 16 (11/30 1500) BP: (131-189)/(57-94) 168/73 (11/30 1500) SpO2:  [93 %-100 %] 94 % (11/30 1500) FiO2 (%):  [30 %] 30 % (11/30 1235) Weight:  [78.5 kg-81.8 kg] 78.5 kg (11/30 0014)  General - Well nourished, well developed, intubated with fentanyl on and off  Cardiovascular - Regular rate and rhythm.  Neuro - intubated on sedation, eyes open, not following commands (possible wiggling of toes to commands but not consistent).  PERRL, inconsistently tracking bilaterally, weak corneal reflexes  bilaterally, blinking bilaterally to threat. Breathing over the vent, on weaning.  Facial symmetry not able to test due to ET tube.  Tongue protrusion not cooperative.  Moves all extremities spontaneously and symmetrical at BLEs but seems stronger at RUE than LUE.   ASSESSMENT/PLAN Randy Ross is a 51 y.o. male with history of end-stage renal disease on intermittent hemodialysis, diabetes, hypertension presented with  witnessed onset slurred speech, with headache at 10:30 PM for which EMS was activated.  On their arrival they report blood pressure was 260/140, glucose 126.  He was found to have a right sided cerebellar ICH.    ICH: Acute right cerebellar small ICH, likely due to chronic uncontrolled hypertension. However, given the embolic infarcts on MRI, hemorrhagic conversion at right cerebellar can not be ruled out Code Stroke CT head Acute ICH at the right cerebellar hemisphere with severe mass effect on the fourth ventricle and cerebral aqueduct. No hydrocephalus. CT repeat x 3 stable hematoma, no intraventricular spread or hydrocephalus. CT head and neck premature atherosclerosis with notable vessel tortuosity suggesting chronic hypertension 2D Echo EF 50 to 55% EEG- most likely toxic-metabolic moderate to severe diffuse encephalopathy. No seizures LDL 91 HgbA1c 5.9 VTE prophylaxis - heparin subq No antithrombotic prior to admission, now on No antithrombotic.  Therapy  recommendations:  pending Disposition:  pending, palliative care on board for [place] discussion  Cerebral Edema On 3% saline protocol -> off now due to fluid status Last NA 143->148->148 -> 144->137->131-129-> 127->134->129 23.4% bolus ordered 11/29 11/23 CT stable ICH Na goal normotensive  Stroke: embolic shower vs. bilateral watershed distribution, etiology unclear, could be cardioembolic vs. hypoperfusion due to aggressive BP control MRI brain 11/21 no change in right cerebellar IPH, surrounding edema without  hydrocephalus.  Multiple small infarcts in bilateral hemispheres, potentially embolic Questionable whether cerebellar ICH was hemorrhagic conversion Consider cardiac monitoring if neuro improves later  ESRD on HD HD MWF Renal Dr. Jonnie Finner following, appreciate help Had HD catheter CRRT stopped 11/17 due to severe anemia -> transition to HD  Acute hypoxic Respiratory failure  Pneumonia:  Vent management per CCM  On Fentanyl for sedation  Versed as needed for agitation on vent Not candidate for extubation Trach aspirate - Klebseilla, strep and H.flu - off Ceftiaxone. Palliative care to help with GOC and decision making.   Hypertension Home meds:  norvasc 10 mg, coreg 25 mg, imdur 30 mg, losartan 100 mg- on hold  Stable off Cleviprex Resume home norvasc and coreg SBP goal < 160 BP goal normotensive  Hyperlipidemia Home meds:  none LDL 91 goal < 70 Consider statin at discharge  Diabetes type II Controlled Home meds:  Semglee HgbA1c 5.9, goal < 7.0 CBGs SSI  dysphagia  OG tube and cortrak placed Start TF's   Leukocytosis  WBC 19.5->17.7->19.9->16.1 afebrile. Monitor  CCM on board  Chronic anemia thormbocytosis  Hgb 8.7 Platelet 690->798->765  Hospital day # 16  Patient seen and examined by NP/APP with MD. MD to update note as needed.   Janine Ores, DNP, FNP-BC [facility] Pager: [phone]  ATTENDING NOTE: I reviewed above note and agree with the assessment and plan. Pt was seen and examined.   No family at bedside.  Patient still intubated, neuro stable from yesterday, eyes open spontaneously, tracking bilaterally, blinking to visual threat bilaterally.  Spontaneous moving all extremities symmetrically.  Palliative care on board, preparing for family meeting tomorrow.  Sodium 133, discussed with Dr. Jonnie Finner nephrology.  HD today with sodium 145 solution.  For detailed assessment and plan, please refer to above/below as I have made changes  wherever appropriate.   Rosalin Hawking, MD PhD Stroke Neurology 09/09/2022 10:31 PM  This patient is critically ill due to [place], respiratory failure, encephalopathy and at significant risk of neurological worsening, death form hydrocephalus, brain herniation. This patient's care requires constant monitoring of vital signs, hemodynamics, respiratory and cardiac monitoring, review of multiple databases, neurological assessment, discussion with family, other specialists and medical decision making of high complexity. I spent 35 minutes of neurocritical care time in the care of this patient.   To contact Stroke Continuity provider, please refer to [email or web address] After hours, contact General Neurology

## 2022-09-09 NOTE — Progress Notes (Signed)
Daily Progress Note   Patient Name: Randy Ross       Date: 09/09/2022 DOB: 25-Jan-1971  Age: 51 y.o. MRN#: [id number] Attending Physician: Stroke, Md, MD Primary Care Physician: Medicine, [place] Date: 08/24/2022  Reason for Consultation/Follow-up: Establishing goals of care  Subjective: Pt intubated, following some commands. Family at  bedside. See below.   Length of Stay: 16  Current Medications: Scheduled Meds:   amLODipine  5 mg Per Tube BID   carvedilol  25 mg Per Tube BID WC   Chlorhexidine Gluconate Cloth  6 each Topical Q0600   [START ON 09/10/2022] Chlorhexidine Gluconate Cloth  6 each Topical Q0600   darbepoetin (ARANESP) injection - DIALYSIS  150 mcg Subcutaneous Q Sun-1800   feeding supplement (PROSource TF20)  60 mL Per Tube BID   heparin injection (subcutaneous)  5,000 Units Subcutaneous Q8H   hydrALAZINE  75 mg Per Tube Q8H   insulin aspart  0-15 Units Subcutaneous Q4H   multivitamin  1 tablet Per Tube QHS   nutrition supplement (JUVEN)  1 packet Per Tube BID BM   mouth rinse  15 mL Mouth Rinse Q2H   pantoprazole (PROTONIX) IV  40 mg Intravenous Daily   sevelamer carbonate  2.4 g Per Tube TID WC    Continuous Infusions:  sodium chloride Stopped (08/30/22 0033)   feeding supplement (NEPRO CARB STEADY) 50 mL/hr at 09/09/22 1200    PRN Meds: sodium chloride, acetaminophen (TYLENOL) oral liquid 160 mg/5 mL, bisacodyl, docusate, fentaNYL (SUBLIMAZE) injection, hydrALAZINE, labetalol, lidocaine (PF), lidocaine-prilocaine, ondansetron (ZOFRAN) IV, mouth rinse, pentafluoroprop-tetrafluoroeth, polyethylene glycol  Physical Exam Constitutional:      General: He is not in acute distress.    Appearance: He is ill-appearing.     Comments:  Restless - intermittently follows commands  Cardiovascular:     Rate and Rhythm: Normal rate.  Pulmonary:     Effort: Pulmonary effort is normal.  Skin:    General: Skin is warm and dry.             Vital Signs: BP (!) 173/80   Pulse 69   Temp 98.4 F (36.9 C) (Axillary)   Resp 13   Ht '5\' 5"'$  (1.651 m)   Wt 78.5 kg   SpO2 99%   BMI 28.80 kg/m  SpO2: SpO2: 99 % O2 Device: O2 Device: Ventilator O2 Flow Rate:    Intake/output summary:  Intake/Output Summary (Last 24 hours) at 09/09/2022 1235 Last data filed at 09/09/2022 1200 Gross per 24 hour  Intake 1354.6 ml  Output 3500 ml  Net -2145.4 ml    LBM: Last BM Date : 09/07/22 Baseline Weight: Weight: 80.4 kg Most recent weight: Weight: 78.5 kg         Patient Active Problem List   Diagnosis Date Noted   ESRD (end stage renal disease) on dialysis ([place]) 09/06/2022   Ischemic cerebrovascular accident (CVA) ([place]) 09/01/2022   Pressure injury of skin 09/01/2022   Nontraumatic intracerebral hemorrhage of cerebellum ([place]) 09/01/2022   Acute respiratory failure with hypoxia ([place]) 08/25/2022   Hypertensive emergency 08/25/2022   Hemorrhagic stroke ([place]) 08/25/2022   Encounter for continuous renal replacement therapy (CRRT) for acute renal  failure ([place]) 08/25/2022   Encephalopathy acute 08/24/2022   ESRD (end stage renal disease) ([place]) 12/15/2021   Essential hypertension 12/15/2021   Diabetes mellitus ([place]) 12/15/2021   CKD (chronic kidney disease), stage IV ([place]) 12/08/2021   AKI (acute kidney injury) ([place]) 12/08/2021   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 male  with past medical history of Anemia, ESRD on HD, diabetes, and hypertension admitted on 08/24/2022 with acute IPH with severe mass effect/brain swelling and hypertensive emergency.  Patient also with inability to protect airway due to mental status.   PMT consulted to discuss goals of care.   Per family patient goes by "Randy Ross"  Assessment: Follow up today with brother "Randy Ross" and mother at bedside. Also attempted to call other family members but no answer - voicemail left with no call back. With brother we review patient's condition and some improvement in mental status. We discuss hope that patient can successfully come off ventilator. We discuss need to discuss how to proceed if patient were to decompensate following extubation. We discuss that though there are improvements in current situation it still remains that Randy Ross will not return to his independent baseline. We review family's previous concerns about what his new quality of life will be and what he would find acceptable. Randy Ross tells me other brother will be coming to town tonight. We discussed plan for family meeting tomorrow 12/1 at 10 AM.   Recommendations/Plan: PMT provider plan to meet with family 12/1 10 AM  Goals of Care and Additional Recommendations: Limitations on Scope of Treatment: Full Scope Treatment  Code Status: Full code  Discharge Planning: To Be Determined  Care plan was discussed with RN, patient's family  Thank you for allowing the Palliative Medicine Team to assist in the care of this patient.   *Please note that this is a verbal dictation therefore any spelling or grammatical errors are due to the "[place] [employer]" system interpretation.  Randy Burrow, DNP, [facility] Palliative Medicine Team Team Phone # [phone]  Pager [phone]

## 2022-09-09 NOTE — Progress Notes (Signed)
Received call that patient self extubated. Upon arrival to room patient is on 3L [place]. No distress noted. Vitals stable. CCM on camera. No reintubation at this time.

## 2022-09-10 DIAGNOSIS — J9600 Acute respiratory failure, unspecified whether with hypoxia or hypercapnia: Secondary | ICD-10-CM

## 2022-09-10 DIAGNOSIS — Z515 Encounter for palliative care: Secondary | ICD-10-CM | POA: Diagnosis not present

## 2022-09-10 DIAGNOSIS — I6340 Cerebral infarction due to embolism of unspecified cerebral artery: Secondary | ICD-10-CM | POA: Diagnosis not present

## 2022-09-10 DIAGNOSIS — G9340 Encephalopathy, unspecified: Secondary | ICD-10-CM

## 2022-09-10 DIAGNOSIS — Z7189 Other specified counseling: Secondary | ICD-10-CM | POA: Diagnosis not present

## 2022-09-10 DIAGNOSIS — G9341 Metabolic encephalopathy: Secondary | ICD-10-CM | POA: Insufficient documentation

## 2022-09-10 DIAGNOSIS — I619 Nontraumatic intracerebral hemorrhage, unspecified: Secondary | ICD-10-CM | POA: Diagnosis not present

## 2022-09-10 DIAGNOSIS — D649 Anemia, unspecified: Secondary | ICD-10-CM

## 2022-09-10 DIAGNOSIS — K922 Gastrointestinal hemorrhage, unspecified: Secondary | ICD-10-CM

## 2022-09-10 LAB — GLUCOSE, CAPILLARY
Glucose-Capillary: 194 mg/dL — ABNORMAL HIGH (ref 70–99)
Glucose-Capillary: 140 mg/dL — ABNORMAL HIGH (ref 70–99)
Glucose-Capillary: 150 mg/dL — ABNORMAL HIGH (ref 70–99)
Glucose-Capillary: 140 mg/dL — ABNORMAL HIGH (ref 70–99)
Glucose-Capillary: 164 mg/dL — ABNORMAL HIGH (ref 70–99)
Glucose-Capillary: 167 mg/dL — ABNORMAL HIGH (ref 70–99)

## 2022-09-10 LAB — MAGNESIUM: Magnesium: 2.1 mg/dL (ref 1.7–2.4)

## 2022-09-10 LAB — RENAL FUNCTION PANEL
Albumin: 2.9 g/dL — ABNORMAL LOW (ref 3.5–5.0)
Anion gap: 15 (ref 5–15)
BUN: 97 mg/dL — ABNORMAL HIGH (ref 6–20)
CO2: 27 mmol/L (ref 22–32)
Calcium: 9.6 mg/dL (ref 8.9–10.3)
Chloride: 91 mmol/L — ABNORMAL LOW (ref 98–111)
Creatinine, Ser: 6.33 mg/dL — ABNORMAL HIGH (ref 0.61–1.24)
GFR, Estimated: 10 mL/min — ABNORMAL LOW (ref >60)
Glucose, Bld: 183 mg/dL — ABNORMAL HIGH (ref 70–99)
Phosphorus: 5.2 mg/dL — ABNORMAL HIGH (ref 2.5–4.6)
Potassium: 3.8 mmol/L (ref 3.5–5.1)
Sodium: 133 mmol/L — ABNORMAL LOW (ref 135–145)

## 2022-09-10 LAB — CBC
HCT: 27.5 % — ABNORMAL LOW (ref 39.0–52.0)
Hemoglobin: 9.3 g/dL — ABNORMAL LOW (ref 13.0–17.0)
MCH: 23.1 pg — ABNORMAL LOW (ref 26.0–34.0)
MCHC: 33.8 g/dL (ref 30.0–36.0)
MCV: 68.4 fL — ABNORMAL LOW (ref 80.0–100.0)
Platelets: 811 10*3/uL — ABNORMAL HIGH (ref 150–400)
RBC: 4.02 MIL/uL — ABNORMAL LOW (ref 4.22–5.81)
RDW: 22.4 % — ABNORMAL HIGH (ref 11.5–15.5)
WBC: 21.2 10*3/uL — ABNORMAL HIGH (ref 4.0–10.5)
nRBC: 0.1 % (ref 0.0–0.2)

## 2022-09-10 MED ORDER — QUETIAPINE FUMARATE 25 MG PO TABS
12.5000 mg | ORAL_TABLET | Freq: Two times a day (BID) | ORAL | Status: DC
  Administered 2022-09-10: 12.5 mg via NASOGASTRIC
  Filled 2022-09-10: qty 1

## 2022-09-10 MED ORDER — QUETIAPINE FUMARATE 25 MG PO TABS
25.0000 mg | ORAL_TABLET | Freq: Two times a day (BID) | ORAL | Status: DC
  Administered 2022-09-10 – 2022-09-12 (×4): 25 mg via NASOGASTRIC
  Filled 2022-09-10 (×4): qty 1

## 2022-09-10 MED ORDER — ORAL CARE MOUTH RINSE: 15.0000 mL | OROMUCOSAL | Status: DC | PRN

## 2022-09-10 MED ORDER — HALOPERIDOL LACTATE 5 MG/ML IJ SOLN
5.0000 mg | Freq: Once | INTRAMUSCULAR | Status: AC
  Administered 2022-09-10: 5 mg via INTRAVENOUS
  Filled 2022-09-10: qty 1

## 2022-09-10 MED ORDER — PANTOPRAZOLE SODIUM 40 MG IV SOLR
40.0000 mg | Freq: Two times a day (BID) | INTRAVENOUS | Status: DC
  Administered 2022-09-10 – 2022-09-26 (×32): 40 mg via INTRAVENOUS
  Filled 2022-09-10 (×31): qty 10

## 2022-09-10 MED ORDER — ORAL CARE MOUTH RINSE: 15.0000 mL | OROMUCOSAL | Status: DC

## 2022-09-10 MED ORDER — HEPARIN SODIUM (PORCINE) 1000 UNIT/ML DIALYSIS: 1500.0000 [IU] | INTRAMUSCULAR | Status: DC | PRN

## 2022-09-10 MED ORDER — HEPARIN SODIUM (PORCINE) 1000 UNIT/ML IJ SOLN
INTRAMUSCULAR | Status: AC
  Administered 2022-09-10: 1500 [IU]
  Filled 2022-09-10: qty 6

## 2022-09-10 MED ORDER — ORAL CARE MOUTH RINSE
15.0000 mL | OROMUCOSAL | Status: DC
  Administered 2022-09-10 – 2022-10-14 (×110): 15 mL via OROMUCOSAL

## 2022-09-10 NOTE — Progress Notes (Signed)
Daily Progress Note   Patient Name: Randy Ross       Date: 09/10/2022 DOB: May 01, 1971  Age: 51 y.o. MRN#: [id number] Attending Physician: Stroke, Md, MD Primary Care Physician: Medicine, [place] Date: 08/24/2022  Reason for Consultation/Follow-up: Establishing goals of care  Subjective: Self extubated yesterday. 3L [place] now. Following some commands.   Length of Stay: 17  Current Medications: Scheduled Meds:   amLODipine  5 mg Per Tube BID   carvedilol  25 mg Per Tube BID WC   Chlorhexidine Gluconate Cloth  6 each Topical Q0600   darbepoetin (ARANESP) injection - DIALYSIS  150 mcg Subcutaneous Q Sun-1800   feeding supplement (PROSource TF20)  60 mL Per Tube BID   heparin sodium (porcine)       hydrALAZINE  75 mg Per Tube Q8H   insulin aspart  0-15 Units Subcutaneous Q4H   multivitamin  1 tablet Per Tube QHS   nutrition supplement (JUVEN)  1 packet Per Tube BID BM   mouth rinse  15 mL Mouth Rinse 4 times per day   pantoprazole (PROTONIX) IV  40 mg Intravenous Q12H   QUEtiapine  25 mg Per NG tube BID   sevelamer carbonate  2.4 g Per Tube TID WC    Continuous Infusions:  sodium chloride Stopped (08/30/22 0033)   feeding supplement (NEPRO CARB STEADY) 50 mL/hr at 09/10/22 1200    PRN Meds: sodium chloride, acetaminophen (TYLENOL) oral liquid 160 mg/5 mL, bisacodyl, fentaNYL (SUBLIMAZE) injection, heparin sodium (porcine), hydrALAZINE, labetalol, lidocaine (PF), lidocaine-prilocaine, ondansetron (ZOFRAN) IV, mouth rinse, pentafluoroprop-tetrafluoroeth  Physical Exam Constitutional:      General: He is not in acute distress.    Appearance: He is ill-appearing.     Comments: Restless - intermittently follows commands  Cardiovascular:     Rate and  Rhythm: Normal rate.  Pulmonary:     Effort: Pulmonary effort is normal.  Skin:    General: Skin is warm and dry.             Vital Signs: BP (!) 169/84   Pulse 82   Temp (!) 97.4 F (36.3 C) (Axillary)   Resp (!) 27   Ht '5\' 5"'$  (1.651 m)   Wt 78.5 kg   SpO2 99%   BMI 28.80 kg/m  SpO2: SpO2: 99 % O2 Device: O2 Device: Nasal Cannula O2 Flow Rate: O2 Flow Rate (L/min): 2 L/min  Intake/output summary:  Intake/Output Summary (Last 24 hours) at 09/10/2022 1326 Last data filed at 09/10/2022 1200 Gross per 24 hour  Intake 1200 ml  Output --  Net 1200 ml    LBM: Last BM Date : 09/10/22 Baseline Weight: Weight: 80.4 kg Most recent weight: Weight: 78.5 kg         Patient Active Problem List   Diagnosis Date Noted   ESRD (end stage renal disease) on dialysis ([place]) 09/06/2022   Ischemic cerebrovascular accident (CVA) ([place]) 09/01/2022   Pressure injury of skin 09/01/2022   Nontraumatic intracerebral hemorrhage of cerebellum ([place]) 09/01/2022   Acute respiratory failure with hypoxia ([place]) 08/25/2022   Hypertensive emergency 08/25/2022   Hemorrhagic stroke ([place]) 08/25/2022   Encounter for continuous renal replacement therapy (CRRT)  for acute renal failure ([place]) 08/25/2022   Encephalopathy acute 08/24/2022   ESRD (end stage renal disease) ([place]) 12/15/2021   Essential hypertension 12/15/2021   Diabetes mellitus ([place]) 12/15/2021   CKD (chronic kidney disease), stage IV ([place]) 12/08/2021   AKI (acute kidney injury) ([place]) 12/08/2021   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 male  with past medical history of Anemia, ESRD on HD, diabetes, and hypertension admitted on 08/24/2022 with acute IPH with severe mass effect/brain swelling and hypertensive emergency.  Patient also with inability to protect airway due to mental status.  PMT consulted to discuss goals of care.   Per  family patient goes by "[name]"  Assessment: Family meeting today to include multiple family members including mother, siblings, and significant other. Interpreter services utilized for mother.  We review patient's hospital course. Review current situation - self extubation yesterday, some improvement in mental status. Review unclear new baseline, not returning to independent baseline. Discuss concern about how the medical team should respond if he were to decompensate - after much conversation and all questions addressed all family agrees they would want patient reintubated. To remain full code. Family also states they would want to move forward with trach if indicated.  Family all agrees to [facility] palliative meetings.   Recommendations/Plan: Full code/full scope PMT to continue family meetings ~weekly  Goals of Care and Additional Recommendations: Limitations on Scope of Treatment: Full Scope Treatment  Code Status: Full code  Discharge Planning: To Be Determined  Care plan was discussed with RN, patient's family, Shirlee Limerick CCM NP  Thank you for allowing the Palliative Medicine Team to assist in the care of this patient.   *Please note that this is a verbal dictation therefore any spelling or grammatical errors are due to the "[place] [employer]" system interpretation.  Juel Burrow, DNP, [facility] Palliative Medicine Team Team Phone # [phone]  Pager [phone]

## 2022-09-10 NOTE — Progress Notes (Signed)
Obtained family consent for fistulogram today.  Will tentatively plan for early next week.  NPO/hold tube feeds 09/13/22 at 0001 in anticipation.  Order placed.  Electronically Signed: Pasty Spillers, PA-C 09/10/2022, 2:40 PM

## 2022-09-10 NOTE — Progress Notes (Signed)
NAMEDiontae Ross, MRN:  [id number], DOB:  06-May-1971, LOS: 37 ADMISSION DATE:  08/24/2022, CONSULTATION DATE:  08/25/2022 REFERRING MD:  [place] - Neuro CHIEF COMPLAINT:  AMS   History of Present Illness:  51 year old man who presented to [facility] ED 11/14 with left sided visual preference, left sided weakness, slurred speech. He was also profoundly hypertensive with SBP over 200s (highest documented is SBP 230). PMHx significant for HTN, T2DM, ESRD on HD (MWF).  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 He was intubated in the ED and neurosurgery was consulted for consideration of EVD.  He was admitted by neurology and PCCM was called in consultation for vent management.  Pertinent Medical History:   Past Medical History:  Diagnosis Date   Anemia    CKD (chronic kidney disease)    on wed 12/09/21   Diabetes mellitus without complication ([place])    type 2   Hypertension    Renal disorder    Significant Hospital Events: Including procedures, antibiotic start and stop dates in addition to other pertinent events   11/15 - Admitted overnight. 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  CTA Head/Neck without e/o vascular lesion at R cerebellar hemorrhage. HTS 3% initiated. Trialysis catheter placed L IJ for CRRT/HTS 23% (per Stroke team). CRRT initiated. 11/16 Low grade temp with Tmax 99.4, WBC uptrending 25.9 (9.0). CXR with increased RLL opacities. Cefepime initiated. Resp Cx/TA > mult spec h.flu, strep species, klebsiella species. TG elevated, weaning prop. 11/17 Ongoing low grade temps Tmax 100.27F. WBC downtrending 19.3 (25.9). CRRT filter clotting causing blood loss, Hgb 6.3. CRRT stopped, 1U PRBCs transfused. HD to resume. Repeat CT Head with small acute infarct at R corpus  callosum body/cingulate white matter, stable R cerebellar hematoma. CXR improved on cefepime. 11/19 stirs to touch 11/24 meeting with palliative care, neurology and [facility].  Family still discussing options among themselves. 11/27 temporary HD catheter placed as fistula not functioning.  11/30 self extubated  12/1 fam mtg w palliative -- full scope of care, full code  Interim History / Subjective:   Self extubated yesterday Some bloody BM   Objective:  Blood pressure (!) 153/95, pulse 78, temperature 97.6 F (36.4 C), temperature source Oral, resp. rate (!) 26, height '5\' 5"'$  (1.651 m), weight 78.5 kg, SpO2 97 %.    Vent Mode: PRVC FiO2 (%):  [30 %] 30 % Set Rate:  [16 bmp] 16 bmp Vt Set:  [490 mL] 490 mL PEEP:  [5 cmH20] 5 cmH20 Pressure Support:  [5 cmH20] 5 cmH20   Intake/Output Summary (Last 24 hours) at 09/10/2022 1029 Last data filed at 09/10/2022 0800 Gross per 24 hour  Intake 1200 ml  Output --  Net 1200 ml   Filed Weights   09/07/22 1204 09/08/22 2000 09/09/22 0014  Weight: 83.9 kg 81.8 kg 78.5 kg   Physical Examination: Gen:      Middle aged M agitated appearing  HEENT:  NCAT pink mm cortrak in place  Lungs:    Scattered rhonchi.  CV:         rr s1s2 no rgm  Abd:      Soft ndnt + bowel sounds  Ext:    No acute joint deformity  Skin:      c/d/w  Neuro: Moving  purposefully    Labs/imaging personally reviewed:   Assessment & Plan:   Acute IPH with "brain compression" Small acute infarct R corpus collosum, R cerebellar hematoma Agitated encephalopathy  -due to HTN emergency  P -supportive care - Continue Coreg, amlodipine, hydralazine - PRN hydralazine, labetalol.  -adding low dose seroquel 12/1  RLL PNE -- klebsiella, strep, h flu -completed 7d abx -wean O2 as able  -self extubated 11/30 -GOC w family -- would desire reintubation, trach if needed  -watching in ICU 12/1 to monitor airway -pulm hygiene   ESRD on HD  - HD per nephro  - Will need AV  fistulogram for difficulties during dialysis.  Anemia  Lower GIB  - Follow CBC, transfuse for hemoglobin less than 7 -BID PPI   DM2 with hyperglycemia - SSI coverage  Inadequate PO intake  - EN   Goals of care - Full code, full scope of offered care (outocme of fam mtg 12/1)  Best practice (evaluated daily):   Diet/type: tubefeeds DVT prophylaxis: SCD  GI prophylaxis: PPI Lines: Randy Ross Foley:  Randy Ross Code Status:  full code Last date of multidisciplinary goals of care discussion: Scheduled for 12/1  CCT: [place] MSN, AGACNP-BC [place] for pager  09/10/2022, 11:22 AM

## 2022-09-10 NOTE — Progress Notes (Signed)
  [facility] Progress Note   Subjective:   seen in room. Off the vent now, no IV drips.   Objective Vitals:   09/10/22 1300 09/10/22 1341 09/10/22 1343 09/10/22 1400  BP: (!) 153/83  (!) 153/74 (!) 165/79  Pulse: 94  96 95  Resp: (!) 24  (!) 24 (!) 26  Temp:   98.3 F (36.8 C)   TempSrc:   Oral   SpO2: 93%  94% 93%  Weight:  83.6 kg    Height:       Physical Exam General: Orally intubated, NG tube Heart: RRR, no murmurs, rubs or gallops Lungs: CTA anteriorly, on vent Abdomen: Soft, non-distended, +BS Extremities: no edema b/l lower extremties Dialysis Access:  LUE AVF + bruit    Dialysis Orders: SW MWF 3h 27mn  80kg   400/800   2/2 bath  Hep 2000  LFA AVF - last HD 11/13, post 80.1kg - mircera 150 q2, last 11/13 - hectorol 461m IV q HD  Assessment/Plan: 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 SP course of hypertonic fluids. Na down to 129 yest, got 1 dose 24.3% saline. Will use high Na+ 145 w/ HD for now per neuro request to keep Na+ in normal range (135-145). Na+ 133 today.  HTN/ vol  - getting coreg/amlodipine per tube. No edema on exam, BP's high-normal. UF 3 L goal w/ HD today, get vol down if will tolerate.  Hyperkalemia - recurrent issue so TF's were changed to Nepro. Resolved.  AMS - more responsive off of sedating meds.  ESRD - pt required CRRT 11/15-11/17/23. Back on iHD MWF. HD today.  HD access - BUN was very high so access recirc was suspected. Using temp HD cath placed 11/27 and BUN better. IR consulted for fistulogram.  Resp Failure: On Vent, per ICU team Anemia of ESRD: Hgb 9.3, continue Aranesp 15082mweekly while here. Secondary HPTH: CCa ok. Phos 8.3, started renvela per NG tube. Continue VDRA.  DM2 - per pmd  GOC - full code  RobKelly SplinterD 09/10/2022, 2:18 PM  Recent Labs  Lab 09/09/22 0549 09/10/22 0421  HGB 9.1* 9.3*  ALBUMIN 2.6* 2.9*  CALCIUM 9.1 9.6  PHOS 3.7 5.2*  CREATININE 3.88*  6.33*  K 3.4* 3.8     Inpatient medications:  amLODipine  5 mg Per Tube BID   carvedilol  25 mg Per Tube BID WC   Chlorhexidine Gluconate Cloth  6 each Topical Q0600   darbepoetin (ARANESP) injection - DIALYSIS  150 mcg Subcutaneous Q Sun-1800   feeding supplement (PROSource TF20)  60 mL Per Tube BID   hydrALAZINE  75 mg Per Tube Q8H   insulin aspart  0-15 Units Subcutaneous Q4H   multivitamin  1 tablet Per Tube QHS   nutrition supplement (JUVEN)  1 packet Per Tube BID BM   mouth rinse  15 mL Mouth Rinse 4 times per day   pantoprazole (PROTONIX) IV  40 mg Intravenous Q12H   QUEtiapine  25 mg Per NG tube BID   sevelamer carbonate  2.4 g Per Tube TID WC    sodium chloride Stopped (08/30/22 0033)   feeding supplement (NEPRO CARB STEADY) 50 mL/hr at 09/10/22 1200   sodium chloride, acetaminophen (TYLENOL) oral liquid 160 mg/5 mL, bisacodyl, fentaNYL (SUBLIMAZE) injection, hydrALAZINE, labetalol, lidocaine (PF), lidocaine-prilocaine, ondansetron (ZOFRAN) IV, mouth rinse, pentafluoroprop-tetrafluoroeth

## 2022-09-10 NOTE — Progress Notes (Addendum)
STROKE TEAM PROGRESS NOTE  INTERVAL HISTORY [name] is a 51 y.o. male with history of end-stage renal disease on intermittent hemodialysis, diabetes, hypertension presented with  witnessed onset slurred speech, with headache at 10:30 PM for which EMS was activated.  On their arrival they report blood pressure was 260/140, glucose 126.  11/15 CT Head with acute IPH in R cerebellar hemisphere with severe mass effect of 4th ventricle and cerebral aqueduct. CTA Head/Neck without e/o vascular lesion at R cerebellar hemorrhage. CRRT/HTS 23% given. 11/17 Repeat CT: small acute infarct at R corpus callosum body/cingulate white matter, stable R cerebellar hematoma. 11/30 Self-extubated.   12/1: Patient continues to be restless and spontaneously move all extremities, b/l mitts in place. Patient has had multiple bloody BMs, CBC checked and protonix increased to BID. Per palliative care, family meeting held today with family stating they would want reintubation/trach if needed and that patient is to remain a FULL CODE.    Vitals:   09/10/22 0600 09/10/22 0700 09/10/22 0800 09/10/22 0900  BP: (!) 163/85 (!) 167/77 (!) 160/76 (!) 153/95  Pulse: 83 83 78 78  Resp: '17 18 19 '$ (!) 26  Temp:      TempSrc:      SpO2: 99% 98% 95% 97%  Weight:      Height:       CBC:  Recent Labs  Lab 09/09/22 0549 09/10/22 0421  WBC 16.1* 21.2*  HGB 9.1* 9.3*  HCT 28.4* 27.5*  MCV 70.6* 68.4*  PLT 765* 811*    Basic Metabolic Panel:  Recent Labs  Lab 09/09/22 0549 09/10/22 0421  NA 133* 133*  K 3.4* 3.8  CL 93* 91*  CO2 28 27  GLUCOSE 178* 183*  BUN 51* 97*  CREATININE 3.88* 6.33*  CALCIUM 9.1 9.6  MG 1.8 2.1  PHOS 3.7 5.2*    Lipid Panel:  Recent Labs  Lab 09/08/22 0628  TRIG 73    HgbA1c:  No results for input(s): "HGBA1C" in the last 168 hours.  Urine Drug Screen: No results for input(s): "LABOPIA", "COCAINSCRNUR", "LABBENZ", "AMPHETMU", "THCU", "LABBARB" in the last 168 hours.   Alcohol Level  No results for input(s): "ETH" in the last 168 hours.   IMAGING past 24 hours No results found.  PHYSICAL EXAM  Temp:  [96.9 F (36.1 C)-97.8 F (36.6 C)] 97.6 F (36.4 C) (12/01 0400) Pulse Rate:  [69-99] 78 (12/01 0900) Resp:  [13-32] 26 (12/01 0900) BP: (121-185)/(68-95) 153/95 (12/01 0900) SpO2:  [91 %-100 %] 97 % (12/01 0900) FiO2 (%):  [30 %] 30 % (11/30 1235)  General - Well nourished, well developed, intubated with fentanyl on and off  Cardiovascular - Regular rate and rhythm.  Respiratory - Unlabored, 4L [place]  Neuro - intubated on sedation, eyes open, not following commands (possible wiggling of toes to commands but not consistent).  PERRL, inconsistently tracking bilaterally, weak corneal reflexes bilaterally, blinking bilaterally to threat. Tongue protrusion not cooperative.  Moves all extremities spontaneously and symmetrical at BLEs but seems stronger at RUE than LUE.  ASSESSMENT/PLAN [name] is a 51 y.o. male with history of end-stage renal disease on intermittent hemodialysis, diabetes, hypertension presented with  witnessed onset slurred speech, with headache at 10:30 PM for which EMS was activated.  On their arrival they report blood pressure was 260/140, glucose 126.  He was found to have a right sided cerebellar ICH.   12/1: EKG was ordered to evaluate QT interval before starting patient on seroquel  to help with continued restlessness and agitation. Patient will stay in icu today, possible transfer out 12/2 if stable.   ICH: Acute right cerebellar small ICH, likely due to chronic uncontrolled hypertension. However, given the embolic infarcts on MRI, hemorrhagic conversion at right cerebellar can not be ruled out Code Stroke CT head Acute ICH at the right cerebellar hemisphere with severe mass effect on the fourth ventricle and cerebral aqueduct. No hydrocephalus. CT repeat x 3 stable hematoma, no intraventricular spread or  hydrocephalus. CT head and neck premature atherosclerosis with notable vessel tortuosity suggesting chronic hypertension 2D Echo EF 50 to 55% EEG- most likely toxic-metabolic moderate to severe diffuse encephalopathy. No seizures LDL 91 HgbA1c 5.9 VTE prophylaxis - heparin subq No antithrombotic prior to admission, now on No antithrombotic.  Therapy recommendations:  pending Disposition:  pending  Cerebral Edema Hyponatremia On 3% saline protocol -> off now due to fluid status Last NA 143->148->148 -> 144->137->131-129-> 127->134->129->133 23.4% bolus ordered 11/29 11/23 CT stable ICH Na goal normotensive  Stroke: embolic shower vs. bilateral watershed distribution, etiology unclear, could be cardioembolic vs. hypoperfusion due to aggressive BP control MRI brain 11/21 no change in right cerebellar IPH, surrounding edema without hydrocephalus.  Multiple small infarcts in bilateral hemispheres, potentially embolic Questionable whether cerebellar ICH was hemorrhagic conversion Consider cardiac monitoring if neuro improves later  ESRD on HD HD MWF Renal Dr. Jonnie Ross following, appreciate help Had HD catheter CRRT stopped 11/17 due to severe anemia -> transition to HD  Acute hypoxic Respiratory failure  Pneumonia:  Self extubated 11/30, currently tolerating okay On intermittent Fentanyl for agitation Versed as needed for agitation on vent Trach aspirate - [place], strep and H.flu - off Ceftiaxone.  Lower GI bleeding 2 episode of BRBPR Seems to be self limited Hb stable Close monitoring  GI consult if needed  Hypertension Home meds:  norvasc 10 mg, coreg 25 mg, imdur 30 mg, losartan 100 mg- on hold  Stable off Cleviprex Resume home norvasc and coreg SBP goal < 160 BP goal normotensive  Hyperlipidemia Home meds:  none LDL 91 goal < 70 Consider statin at discharge  Diabetes type II Controlled Home meds:  [place] HgbA1c 5.9, goal < 7.0 CBGs SSI  dysphagia  OG  tube and cortrak placed Start TF's   Leukocytosis  WBC 19.5->17.7->19.9->16.1->21.2 afebrile. Monitor  CCM on board  Chronic anemia thormbocytosis  Hgb 8.7->9.3 Platelet 690->798->765->811  Hospital day # 17  Patient seen and examined by NP/APP with MD. MD to update note as needed.   Rolan Bucco, DNP, AGACNP-BC [facility] Pager: [phone]  ATTENDING NOTE: I reviewed above note and agree with the assessment and plan. Pt was seen and examined.   No family at bedside.  Patient self extubated yesterday, currently on nasal cannula, O2 sats 98%.  Patient on restrain, delirium, agitated, restless in bed, moving all extremities strongly.  Still nonverbal, not following commands, but blinking to visual threat bilaterally, tracking bilaterally.  Palliative care on board, discussed with family, currently full code.  Discussed with CCM, will taper off nasal cannula, add Seroquel for agitation.  For detailed assessment and plan, please refer to above/below as I have made changes wherever appropriate.   Rosalin Hawking, MD PhD Stroke Neurology 09/10/2022 7:07 PM  This patient is critically ill due to [place], respiratory failure, encephalopathy and at significant risk of neurological worsening, death form hydrocephalus, brain herniation. This patient's care requires constant monitoring of vital signs, hemodynamics, respiratory and cardiac monitoring, review of multiple  databases, neurological assessment, discussion with family, other specialists and medical decision making of high complexity. I spent 30 minutes of neurocritical care time in the care of this patient.     To contact Stroke Continuity provider, please refer to [email or web address] After hours, contact General Neurology

## 2022-09-10 NOTE — Progress Notes (Signed)
TX done at bedside.  Alert and disoriented x4 Informed consent signed and in chart.   Treatment initiated: 1438  Treatment completed: 1826   Patient tolerated well. Alert,disoriented without acute distress, uncooperative, agitated during tx.  Hand-off given to patient's nurse.   Access used: dialysis cath Access issues: none Dressing: tegaderm, biopatch, changed 09/10/22 Total UF removed: 3L Medication(s) given: none Post HD VS: see table Post HD weight: 81.8kg   09/10/22 1839  Vitals  BP (!) 172/119  MAP (mmHg) 136  BP Location Right Arm  BP Method Automatic  Patient Position (if appropriate) Lying  Pulse Rate 95  Pulse Rate Source Monitor  ECG Heart Rate 96  Resp (!) 21  Oxygen Therapy  SpO2 92 %  O2 Device Room Air  Patient Activity (if Appropriate) In bed  Pulse Oximetry Type Continuous  During Treatment Monitoring  Intra-Hemodialysis Comments Tolerated well   [employer] [facility]

## 2022-09-11 DIAGNOSIS — G9340 Encephalopathy, unspecified: Secondary | ICD-10-CM | POA: Diagnosis not present

## 2022-09-11 LAB — CBC
HCT: 31.3 % — ABNORMAL LOW (ref 39.0–52.0)
Hemoglobin: 10 g/dL — ABNORMAL LOW (ref 13.0–17.0)
MCH: 22.6 pg — ABNORMAL LOW (ref 26.0–34.0)
MCHC: 31.9 g/dL (ref 30.0–36.0)
MCV: 70.8 fL — ABNORMAL LOW (ref 80.0–100.0)
Platelets: 822 10*3/uL — ABNORMAL HIGH (ref 150–400)
RBC: 4.42 MIL/uL (ref 4.22–5.81)
RDW: 22.7 % — ABNORMAL HIGH (ref 11.5–15.5)
WBC: 18.8 10*3/uL — ABNORMAL HIGH (ref 4.0–10.5)
nRBC: 0 % (ref 0.0–0.2)

## 2022-09-11 LAB — GLUCOSE, CAPILLARY
Glucose-Capillary: 138 mg/dL — ABNORMAL HIGH (ref 70–99)
Glucose-Capillary: 150 mg/dL — ABNORMAL HIGH (ref 70–99)
Glucose-Capillary: 153 mg/dL — ABNORMAL HIGH (ref 70–99)
Glucose-Capillary: 157 mg/dL — ABNORMAL HIGH (ref 70–99)
Glucose-Capillary: 164 mg/dL — ABNORMAL HIGH (ref 70–99)
Glucose-Capillary: 184 mg/dL — ABNORMAL HIGH (ref 70–99)

## 2022-09-11 LAB — RENAL FUNCTION PANEL
Albumin: 2.9 g/dL — ABNORMAL LOW (ref 3.5–5.0)
Anion gap: 13 (ref 5–15)
BUN: 60 mg/dL — ABNORMAL HIGH (ref 6–20)
CO2: 25 mmol/L (ref 22–32)
Calcium: 9.3 mg/dL (ref 8.9–10.3)
Chloride: 99 mmol/L (ref 98–111)
Creatinine, Ser: 4.68 mg/dL — ABNORMAL HIGH (ref 0.61–1.24)
GFR, Estimated: 14 mL/min — ABNORMAL LOW (ref >60)
Glucose, Bld: 136 mg/dL — ABNORMAL HIGH (ref 70–99)
Phosphorus: 3.4 mg/dL (ref 2.5–4.6)
Potassium: 3.7 mmol/L (ref 3.5–5.1)
Sodium: 137 mmol/L (ref 135–145)

## 2022-09-11 LAB — TRIGLYCERIDES: Triglycerides: 55 mg/dL (ref <150)

## 2022-09-11 LAB — MAGNESIUM: Magnesium: 2 mg/dL (ref 1.7–2.4)

## 2022-09-11 MED ORDER — HYDRALAZINE HCL 50 MG PO TABS
100.0000 mg | ORAL_TABLET | Freq: Three times a day (TID) | ORAL | Status: DC
  Administered 2022-09-11 – 2022-09-23 (×27): 100 mg
  Filled 2022-09-11 (×27): qty 2

## 2022-09-11 MED ORDER — INSULIN ASPART 100 UNIT/ML IJ SOLN
0.0000 [IU] | Freq: Four times a day (QID) | INTRAMUSCULAR | Status: DC
  Administered 2022-09-11: 2 [IU] via SUBCUTANEOUS
  Administered 2022-09-12: 3 [IU] via SUBCUTANEOUS
  Administered 2022-09-12: 2 [IU] via SUBCUTANEOUS
  Administered 2022-09-12: 3 [IU] via SUBCUTANEOUS
  Administered 2022-09-13: 1 [IU] via SUBCUTANEOUS
  Administered 2022-09-15: 2 [IU] via SUBCUTANEOUS
  Administered 2022-09-15 – 2022-09-16 (×2): 3 [IU] via SUBCUTANEOUS
  Administered 2022-09-16: 2 [IU] via SUBCUTANEOUS
  Administered 2022-09-16: 3 [IU] via SUBCUTANEOUS
  Administered 2022-09-17: 5 [IU] via SUBCUTANEOUS
  Administered 2022-09-17 (×2): 2 [IU] via SUBCUTANEOUS
  Administered 2022-09-18: 3 [IU] via SUBCUTANEOUS
  Administered 2022-09-18 – 2022-09-19 (×3): 2 [IU] via SUBCUTANEOUS
  Administered 2022-09-19 – 2022-09-20 (×5): 3 [IU] via SUBCUTANEOUS
  Administered 2022-09-20: 2 [IU] via SUBCUTANEOUS
  Administered 2022-09-20: 3 [IU] via SUBCUTANEOUS
  Administered 2022-09-21: 2 [IU] via SUBCUTANEOUS
  Administered 2022-09-21: 3 [IU] via SUBCUTANEOUS
  Administered 2022-09-21: 2 [IU] via SUBCUTANEOUS
  Administered 2022-09-21 – 2022-09-22 (×2): 3 [IU] via SUBCUTANEOUS
  Administered 2022-09-22 – 2022-09-23 (×3): 2 [IU] via SUBCUTANEOUS
  Administered 2022-09-24: 3 [IU] via SUBCUTANEOUS
  Administered 2022-09-24: 5 [IU] via SUBCUTANEOUS
  Administered 2022-09-25: 3 [IU] via SUBCUTANEOUS
  Administered 2022-09-25 – 2022-09-26 (×3): 2 [IU] via SUBCUTANEOUS
  Administered 2022-09-26: 3 [IU] via SUBCUTANEOUS
  Administered 2022-09-26 – 2022-09-27 (×2): 2 [IU] via SUBCUTANEOUS
  Administered 2022-09-27: 8 [IU] via SUBCUTANEOUS
  Administered 2022-09-27 – 2022-09-28 (×3): 3 [IU] via SUBCUTANEOUS
  Administered 2022-09-29 – 2022-09-30 (×5): 2 [IU] via SUBCUTANEOUS
  Administered 2022-09-30: 5 [IU] via SUBCUTANEOUS
  Administered 2022-10-01: 3 [IU] via SUBCUTANEOUS
  Administered 2022-10-01 (×2): 2 [IU] via SUBCUTANEOUS
  Administered 2022-10-02: 5 [IU] via SUBCUTANEOUS
  Administered 2022-10-02 (×2): 2 [IU] via SUBCUTANEOUS
  Administered 2022-10-03 – 2022-10-04 (×3): 3 [IU] via SUBCUTANEOUS
  Administered 2022-10-04: 2 [IU] via SUBCUTANEOUS
  Administered 2022-10-04: 3 [IU] via SUBCUTANEOUS
  Administered 2022-10-05 (×2): 2 [IU] via SUBCUTANEOUS
  Administered 2022-10-05 – 2022-10-07 (×4): 3 [IU] via SUBCUTANEOUS
  Administered 2022-10-08: 5 [IU] via SUBCUTANEOUS
  Administered 2022-10-08: 2 [IU] via SUBCUTANEOUS
  Administered 2022-10-08: 3 [IU] via SUBCUTANEOUS
  Administered 2022-10-09: 5 [IU] via SUBCUTANEOUS
  Administered 2022-10-10: 3 [IU] via SUBCUTANEOUS
  Administered 2022-10-10 – 2022-10-12 (×4): 2 [IU] via SUBCUTANEOUS
  Administered 2022-10-12 (×2): 3 [IU] via SUBCUTANEOUS
  Administered 2022-10-13: 5 [IU] via SUBCUTANEOUS
  Administered 2022-10-14: 2 [IU] via SUBCUTANEOUS

## 2022-09-11 MED ORDER — DARBEPOETIN ALFA 150 MCG/0.3ML IJ SOSY
150.0000 ug | PREFILLED_SYRINGE | INTRAMUSCULAR | Status: DC
  Administered 2022-09-14 – 2022-10-04 (×4): 150 ug via SUBCUTANEOUS
  Filled 2022-09-11 (×4): qty 0.3

## 2022-09-11 MED ORDER — INSULIN GLARGINE-YFGN 100 UNIT/ML ~~LOC~~ SOLN
10.0000 [IU] | Freq: Every day | SUBCUTANEOUS | Status: DC
  Administered 2022-09-11 – 2022-10-14 (×33): 10 [IU] via SUBCUTANEOUS
  Filled 2022-09-11 (×34): qty 0.1

## 2022-09-11 NOTE — Progress Notes (Signed)
[facility] Progress Note   Subjective:   seen in room. 3 L off w/ HD yesterday again. Down to 80kg. Lowest wt here 78kg.   Objective Vitals:   09/11/22 0600 09/11/22 0621 09/11/22 0700 09/11/22 0800  BP: (!) 169/84 (!) 169/84 (!) 155/88 (!) 168/70  Pulse: 87  86 80  Resp: 20  (!) 21 19  Temp:      TempSrc:    Axillary  SpO2: 95%  95% 95%  Weight:      Height:       Physical Exam General: Orally intubated, NG tube Heart: RRR, no murmurs, rubs or gallops Lungs: CTA anteriorly, on vent Abdomen: Soft, non-distended, +BS Extremities: no edema b/l lower extremties Dialysis Access:  LUE AVF + bruit    Dialysis Orders: SW MWF 3h 53mn  80kg   400/800   2/2 bath  Hep 2000  LFA AVF - last HD 11/13, post 80.1kg - mircera 150 q2, last 11/13 - hectorol 424m IV q HD  Assessment/Plan: 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 SP course of hypertonic fluids. Na down to 129 yest, got 1 dose 24.3% saline. Will use high Na+ 145 w/ HD for now per neuro request to keep Na+ in normal range (135-145). Na+ 133 today.  HTN/ vol  - getting amlodipine/ coreg/amlodipine per tube. No edema on exam, BP's high-normal. UF 3 L goal w/ HD today, get vol down w/ HD this week. At dry wt but likely has lost body wt.  Hyperkalemia - recurrent issue so TF's were changed to Nepro AMS - more responsive off of sedating meds. Extubated now.  ESRD - pt required CRRT 11/15-11/17/23. Getting HD MWF now. NExt HD Monday.  HD access - BUN was very high so access recirc was suspected. Using temp HD cath placed 11/27 and BUN better. IR consulted for fistulogram.  Resp Failure - off the vent Anemia of ESRD: Hgb 9.3, continue Aranesp 15020mweekly while here. Secondary HPTH: CCa ok. Phos 8.3, started renvela per NG tube. Continue VDRA.  DM2 - per pmd  GOC - full code  RobKelly SplinterD 09/11/2022, 8:33 AM  Recent Labs  Lab 09/10/22 0421 09/11/22 0640  HGB 9.3* 10.0*   ALBUMIN 2.9* 2.9*  CALCIUM 9.6 9.3  PHOS 5.2* 3.4  CREATININE 6.33* 4.68*  K 3.8 3.7     Inpatient medications:  amLODipine  5 mg Per Tube BID   carvedilol  25 mg Per Tube BID WC   Chlorhexidine Gluconate Cloth  6 each Topical Q0600   darbepoetin (ARANESP) injection - DIALYSIS  150 mcg Subcutaneous Q Sun-1800   feeding supplement (PROSource TF20)  60 mL Per Tube BID   hydrALAZINE  100 mg Per Tube Q8H   insulin aspart  0-15 Units Subcutaneous Q4H   multivitamin  1 tablet Per Tube QHS   nutrition supplement (JUVEN)  1 packet Per Tube BID BM   mouth rinse  15 mL Mouth Rinse 4 times per day   pantoprazole (PROTONIX) IV  40 mg Intravenous Q12H   QUEtiapine  25 mg Per NG tube BID   sevelamer carbonate  2.4 g Per Tube TID WC    sodium chloride Stopped (08/30/22 0033)   feeding supplement (NEPRO CARB STEADY) 50 mL/hr at 09/11/22 0800   sodium chloride, acetaminophen (TYLENOL) oral liquid 160 mg/5 mL, bisacodyl, fentaNYL (SUBLIMAZE) injection, heparin, hydrALAZINE, labetalol, lidocaine (PF), lidocaine-prilocaine, ondansetron (ZOFRAN) IV, mouth rinse, pentafluoroprop-tetrafluoroeth

## 2022-09-11 NOTE — Progress Notes (Signed)
STROKE TEAM PROGRESS NOTE  INTERVAL HISTORY Mr. Daeveon Zweber is a 51 y.o. male with history of end-stage renal disease on intermittent hemodialysis, diabetes, hypertension presented with  witnessed onset slurred speech, with headache at 10:30 PM for which EMS was activated.  On their arrival they report blood pressure was 260/140, glucose 126.  11/15 CT Head with acute IPH in R cerebellar hemisphere with severe mass effect of 4th ventricle and cerebral aqueduct. CTA Head/Neck without e/o vascular lesion at R cerebellar hemorrhage. CRRT/HTS 23% given. 11/17 Repeat CT: small acute infarct at R corpus callosum body/cingulate white matter, stable R cerebellar hematoma. 11/30 Self-extubated.   12/1: Patient continues to be restless and spontaneously move all extremities, b/l mitts in place. Patient has had multiple bloody BMs, CBC checked and protonix increased to BID. Per palliative care, family meeting held today with family stating they would want reintubation/trach if needed and that patient is to remain a FULL CODE.   12/2: prn BP meds required overnight. No prns for agitation. Still non verbal. Does not follow commands. Will localize to pain and move all ext. No resp issues overnight.  Hydralazine increased to '100mg'$  TID. Transfer out of ICU to hospitalist team today.   Vitals:   09/11/22 0600 09/11/22 0621 09/11/22 0700 09/11/22 0800  BP: (!) 169/84 (!) 169/84 (!) 155/88 (!) 168/70  Pulse: 87  86 80  Resp: 20  (!) 21 19  Temp:      TempSrc:    Axillary  SpO2: 95%  95% 95%  Weight:      Height:       CBC:  Recent Labs  Lab 09/10/22 0421 09/11/22 0640  WBC 21.2* 18.8*  HGB 9.3* 10.0*  HCT 27.5* 31.3*  MCV 68.4* 70.8*  PLT 811* 822*    Basic Metabolic Panel:  Recent Labs  Lab 09/10/22 0421 09/11/22 0640  NA 133* 137  K 3.8 3.7  CL 91* 99  CO2 27 25  GLUCOSE 183* 136*  BUN 97* 60*  CREATININE 6.33* 4.68*  CALCIUM 9.6 9.3  MG 2.1 2.0  PHOS 5.2* 3.4    Lipid Panel:   Recent Labs  Lab 09/11/22 0640  TRIG 55    HgbA1c:  No results for input(s): "HGBA1C" in the last 168 hours.  Urine Drug Screen: No results for input(s): "LABOPIA", "COCAINSCRNUR", "LABBENZ", "AMPHETMU", "THCU", "LABBARB" in the last 168 hours.  Alcohol Level  No results for input(s): "ETH" in the last 168 hours.   IMAGING past 24 hours No results found.  PHYSICAL EXAM  Temp:  [97.7 F (36.5 C)-99.4 F (37.4 C)] 99.4 F (37.4 C) (12/02 0400) Pulse Rate:  [76-102] 80 (12/02 0800) Resp:  [15-30] 19 (12/02 0800) BP: (139-185)/(62-119) 168/70 (12/02 0800) SpO2:  [87 %-99 %] 95 % (12/02 0800) Weight:  [80.2 kg-83.6 kg] 80.2 kg (12/02 0400)  General - Well nourished, well developed, on room air. Cortrak in place.  Cardiovascular - Regular rate and rhythm.  Respiratory - Unlabored, 4L [place]  Neuro - Will awake with sternal rub and localized with right UE. eyes open, not following commands (possible wiggling of toes to commands but not consistent).  PERRL, inconsistently tracking bilaterally, weak corneal reflexes bilaterally, blinking bilaterally to threat. Tongue protrusion not cooperative.  Moves all extremities spontaneously and symmetrical at BLEs but seems stronger at RUE than LUE.  ASSESSMENT/PLAN Mr. Rocko Fesperman is a 51 y.o. male with history of end-stage renal disease on intermittent hemodialysis, diabetes, hypertension presented with  witnessed  onset slurred speech, with headache at 10:30 PM for which EMS was activated.  On their arrival they report blood pressure was 260/140, glucose 126.  He was found to have a right sided cerebellar ICH.   12/1: EKG was ordered to evaluate QT interval before starting patient on seroquel to help with continued restlessness and agitation. transfer out 12/2 and to hospitalist team.   12/2: no agitation overnight. Hydralazine increased. Out of ICU today and transfer to internal medicine. Seroquel addition helped yesterday with  agitation.  ICH: Acute right cerebellar small ICH, likely due to chronic uncontrolled hypertension. However, given the embolic infarcts on MRI, hemorrhagic conversion at right cerebellar can not be ruled out Code Stroke CT head Acute ICH at the right cerebellar hemisphere with severe mass effect on the fourth ventricle and cerebral aqueduct. No hydrocephalus. CT repeat x 3 stable hematoma, no intraventricular spread or hydrocephalus. CT head and neck premature atherosclerosis with notable vessel tortuosity suggesting chronic hypertension 2D Echo EF 50 to 55% EEG- most likely toxic-metabolic moderate to severe diffuse encephalopathy. No seizures LDL 91 HgbA1c 5.9 VTE prophylaxis - heparin subq No antithrombotic prior to admission, now on No antithrombotic.  Therapy recommendations:  pending Disposition:  pending  Cerebral Edema Hyponatremia On 3% saline protocol -> off now due to fluid status Last NA 143->148->148 -> 144->137->131-129-> 127->134->129->133>137 23.4% bolus ordered 11/29 11/23 CT stable ICH Na goal normotensive  Stroke: embolic shower vs. bilateral watershed distribution, etiology unclear, could be cardioembolic vs. hypoperfusion due to aggressive BP control MRI brain 11/21 no change in right cerebellar IPH, surrounding edema without hydrocephalus.  Multiple small infarcts in bilateral hemispheres, potentially embolic Questionable whether cerebellar ICH was hemorrhagic conversion Consider cardiac monitoring if neuro improves later  ESRD on HD HD MWF Renal Dr. Jonnie Finner following, appreciate help Had HD catheter CRRT stopped 11/17 due to severe anemia -> transition to HD  Acute hypoxic Respiratory failure  Pneumonia:  Self extubated 11/30, currently tolerating okay Trach aspirate - Klebseilla, strep and H.flu - off Ceftiaxone.  Lower GI bleeding 2 episode of BRBPR Seems to be self limited Hb stable Close monitoring  GI consult if needed  Hypertension Home  meds:  norvasc 10 mg, coreg 25 mg, imdur 30 mg, losartan 100 mg-  Stable off Cleviprex On home norvasc and coreg. On Hydralazine. SBP goal < 160 BP goal normotensive  Hyperlipidemia Home meds:  none LDL 91 goal < 70 Consider statin at discharge  Diabetes type II Controlled Home meds:  Semglee HgbA1c 5.9, goal < 7.0 CBGs SSI  dysphagia  OG tube and cortrak placed Con't TF's. May need PEG if no improvement.   Leukocytosis  WBC 19.5->17.7->19.9->16.1->21.2>18.8 afebrile. Monitor  CCM signed off today 12/2.  Chronic anemia thormbocytosis  Hgb 8.7->9.3 Platelet 690->798->765->811>882  Hospital day # 18  Plan discussed with ICU nurse.  This patient is critically ill due to [place], respiratory failure, encephalopathy and at significant risk of neurological worsening, death form hydrocephalus, brain herniation. This patient's care requires constant monitoring of vital signs, hemodynamics, respiratory and cardiac monitoring, review of multiple databases, neurological assessment, discussion with family, other specialists and medical decision making of high complexity. I spent 30 minutes of neurocritical care time in the care of this patient.     To contact Stroke Continuity provider, please refer to [email or web address] After hours, contact General Neurology

## 2022-09-12 DIAGNOSIS — I614 Nontraumatic intracerebral hemorrhage in cerebellum: Secondary | ICD-10-CM | POA: Diagnosis not present

## 2022-09-12 DIAGNOSIS — N186 End stage renal disease: Secondary | ICD-10-CM

## 2022-09-12 DIAGNOSIS — I639 Cerebral infarction, unspecified: Secondary | ICD-10-CM | POA: Insufficient documentation

## 2022-09-12 DIAGNOSIS — G936 Cerebral edema: Secondary | ICD-10-CM | POA: Insufficient documentation

## 2022-09-12 DIAGNOSIS — G9340 Encephalopathy, unspecified: Secondary | ICD-10-CM | POA: Diagnosis not present

## 2022-09-12 DIAGNOSIS — R1310 Dysphagia, unspecified: Secondary | ICD-10-CM

## 2022-09-12 DIAGNOSIS — K922 Gastrointestinal hemorrhage, unspecified: Secondary | ICD-10-CM | POA: Insufficient documentation

## 2022-09-12 LAB — RENAL FUNCTION PANEL
Albumin: 2.8 g/dL — ABNORMAL LOW (ref 3.5–5.0)
Anion gap: 15 (ref 5–15)
BUN: 121 mg/dL — ABNORMAL HIGH (ref 6–20)
CO2: 24 mmol/L (ref 22–32)
Calcium: 9.2 mg/dL (ref 8.9–10.3)
Chloride: 96 mmol/L — ABNORMAL LOW (ref 98–111)
Creatinine, Ser: 6.7 mg/dL — ABNORMAL HIGH (ref 0.61–1.24)
GFR, Estimated: 9 mL/min — ABNORMAL LOW (ref >60)
Glucose, Bld: 118 mg/dL — ABNORMAL HIGH (ref 70–99)
Phosphorus: 4 mg/dL (ref 2.5–4.6)
Potassium: 4.1 mmol/L (ref 3.5–5.1)
Sodium: 135 mmol/L (ref 135–145)

## 2022-09-12 LAB — CBC
HCT: 29.9 % — ABNORMAL LOW (ref 39.0–52.0)
Hemoglobin: 9.9 g/dL — ABNORMAL LOW (ref 13.0–17.0)
MCH: 22.9 pg — ABNORMAL LOW (ref 26.0–34.0)
MCHC: 33.1 g/dL (ref 30.0–36.0)
MCV: 69.1 fL — ABNORMAL LOW (ref 80.0–100.0)
Platelets: 770 10*3/uL — ABNORMAL HIGH (ref 150–400)
RBC: 4.33 MIL/uL (ref 4.22–5.81)
RDW: 22.5 % — ABNORMAL HIGH (ref 11.5–15.5)
WBC: 14.3 10*3/uL — ABNORMAL HIGH (ref 4.0–10.5)
nRBC: 0 % (ref 0.0–0.2)

## 2022-09-12 LAB — GLUCOSE, CAPILLARY
Glucose-Capillary: 159 mg/dL — ABNORMAL HIGH (ref 70–99)
Glucose-Capillary: 177 mg/dL — ABNORMAL HIGH (ref 70–99)
Glucose-Capillary: 147 mg/dL — ABNORMAL HIGH (ref 70–99)
Glucose-Capillary: 91 mg/dL (ref 70–99)
Glucose-Capillary: 98 mg/dL (ref 70–99)
Glucose-Capillary: 109 mg/dL — ABNORMAL HIGH (ref 70–99)

## 2022-09-12 LAB — MAGNESIUM: Magnesium: 2.4 mg/dL (ref 1.7–2.4)

## 2022-09-12 MED ORDER — QUETIAPINE FUMARATE 25 MG PO TABS: 25.0000 mg | ORAL_TABLET | Freq: Once | ORAL | Status: DC

## 2022-09-12 MED ORDER — HEPARIN SODIUM (PORCINE) 5000 UNIT/ML IJ SOLN
5000.0000 [IU] | Freq: Three times a day (TID) | INTRAMUSCULAR | Status: DC
  Administered 2022-09-12 – 2022-10-13 (×83): 5000 [IU] via SUBCUTANEOUS
  Administered 2022-10-13: 1000 [IU] via SUBCUTANEOUS
  Administered 2022-10-14: 5000 [IU] via SUBCUTANEOUS
  Filled 2022-09-12 (×89): qty 1

## 2022-09-12 MED ORDER — QUETIAPINE FUMARATE 50 MG PO TABS
50.0000 mg | ORAL_TABLET | Freq: Two times a day (BID) | ORAL | Status: DC
  Administered 2022-09-15 – 2022-09-23 (×16): 50 mg via NASOGASTRIC
  Filled 2022-09-12 (×16): qty 1

## 2022-09-12 NOTE — Hospital Course (Addendum)
Randy Ross is a 51 y.o. male with past medical history significant for ESRD on HD, DM2, essential hypertension who presented to [facility] ED with sudden onset slurred speech, headache.  Workup in the ER with elevated BP of 260/140 and CT head showing cerebellar hemorrhage.  Patient was initially intubated in the ED for airway protection and admitted to the PCCM/neurology service.    Significant events: 11/14: Admitted for acute cerebellar hemorrhage; intubated in the ER, started on hypertonic saline 11/15: Neurosurgery were consulted, recommended against surgical evacuation 11/16: Cefepime started for suspected aspiration 11/17: Required 1u PRBC transfusion due to CRRT filter 11/19: EEG nonspecific 11/21: MRI brain shows new scattered bilateral acute infarcts 11/22: Completed 7 days antibiotics 11/24: IR consulted given recirculation syndrome, high BUN; plan for fistulogram pending 11/27: Temp cath HD cath placed  11/30: Self-extubated 12/1: Persistent delirium 12/2: Transferred out of ICU to [facility] service 12/3: Pulled out cortrak 12/4: Failed SLP, needs cortrak, fistulogram shows poor outflow 12/5: TDC placed, feeding tube replaced 12/6: HD catheter placed by IR 12/9: Family meeting with palliative care, family to decide on possible transitioning to comfort measures  12/11: Family decided to continue aggressive measures, IR consulted for PEG tube 12/12: SLP requesting MBS prior to PEG tube placement; MBS likely plan for 12/12 12/13: MBS done and recommending dysphagia 1 diet with honey thick liquids  Today 12/23: Waiting on CIR in [place]    Consultants:  Nephrology Neurosurgery  Vascular Surgery  IR Palliative Care   Procedures: See above       ASSESSMENT & PLAN:   Principal Problem:   Acute cerebellar hemorrhage ([place]) Active Problems:   Anemia   ESRD (end stage renal disease) ([place])   Diabetes mellitus ([place])   Acute respiratory failure with hypoxia ([place])    Hypertensive emergency   Pressure injury of skin   Acute metabolic encephalopathy and delirium   Cerebral edema ([place])   Acute embolic stroke ([place])   Lower GI bleeding   Dysphagia   Palliative care by specialist    Acute Cerebellar hemorrhage 2/2 poorly controlled hypertension, cerebral edema  Patient presenting to ED via EMS with mental status change, left-sided weakness, slurred speech and left-sided gaze preference.  He was severely hypertensive on admission, was placed in the ICU and was intubated for airway protection, placed on Cleviprex infusion.  CT scan was notable for IPH right cerebellar hemisphere with mass effect.   He eventually improved, neurology signed off, and was transferred to the hospitalist service. Neurologically he has remained stable and he is stable for discharge. Awaiting insurance authorization for inpatient rehab in [facility], after that is obtained he will need HD spot approval in [place].  SW involved  Hyperkalemia BMP today pending   Dysphagia, improving  initially requiring a core track, there were plans for PEG tube but he is improving, eating on his own, now does not need a PEG nor has the core track anymore.  Able to eat on his own Continue po diet    Lower GI Bleeding  Noted by nurse, transient, no change in hemoglobin.   No further bleeding reported.   Monitor hemoglobin, overall stable   Acute Embolic CVA  MR brain 08/65 with many new acute infarcts throughout the bilateral frontal and parietal white matter consistent with embolism. TTE with LVEF 50-55%.  LDL 91, hemoglobin A1c 5.9.  Holding antiplatelets due to cerebellar hemorrhage.  Neurology now signed off; and do not recommend further evaluation for cardiac source of  embolism as he is not a good long-term anticoagulation candidate.   Hypertensive Emergency  BP was severely elevated on admission, likely leading to acute cerebellar hemorrhage.   Currently on amlodipine, Coreg,  hydralazine.     Acute Respiratory Failure with Hypoxia - Resolved   Aspiration Pneumonia - completed antibiotics   Acute Metabolic Encephalopathy - better, but remains impulsive at times, trying to pull out his tunneled HD cath and requiring wrist restraint   Type 2 Diabetes Mellitus-Hemoglobin A1c 5.9 08/25/2022, well-controlled.   ESRD on HD, hyperkalemia.   Vascular consulted that he needs a fistula revision, this could be done as an outpatient.  For now he is having HD via  tunneled cath BMP today pending    Anemia of Chronic Medical/renal disease -Hemoglobin stable, continue intermittent monitoring.    Hyponatremia -mild, monitor    Clotted Fistula -Fistulogram showed poor outflow from fistula, nephrology consulted IR for HD catheter placement which was performed on 12/6.   DVT prophylaxis: SCD Pertinent IV fluids/nutrition: no continuous IV fluids, tolerating solid diet Central lines / invasive devices: dialysis tunneled catheter   Code Status: FULL CODE  Disposition: plan CIR TOC needs: CIR placement in [place] per family wishes  Barriers to discharge / significant pending items: see above - placement

## 2022-09-12 NOTE — Assessment & Plan Note (Addendum)
MRI brain 11/21 showed many small new acute infarcts throughout bilateral frontal and parietal white matter, consistent with embolism -Consult nephrology

## 2022-09-12 NOTE — Assessment & Plan Note (Signed)
Due to persistent encephalopathy Pulled out Cortrak yesterday, RD team unable to place today.  Nursing will place NG - Continue tube feeds when NG back in place

## 2022-09-12 NOTE — Progress Notes (Signed)
I called nurse for clarification because I was told in my report that MD did not want restraint order so I asked her to get the order Dc'd because she was the one who received that info from the MD and there is an active order for a restraint.  She said that what she meant to say was that MD did not want to give a new restraint order at this time and would wait until order was expired and reevaluate if new restraint needed or not.

## 2022-09-12 NOTE — Progress Notes (Addendum)
Randy Ross Progress Note   Subjective:   seen in room. Family at bedside.   Objective Vitals:   09/12/22 1300 09/12/22 1400 09/12/22 1537 09/12/22 2000  BP: (!) 150/73 (!) 156/74 (!) 146/64 (!) 189/83  Pulse: 83 86 87 84  Resp: (!) '23 18 16 18  '$ Temp:   98.4 F (36.9 C) 98.1 F (36.7 C)  TempSrc:   Oral Axillary  SpO2: 97% 95% 99% 99%  Weight:      Height:       Physical Exam General: extubated, NG tube in  Heart: RRR, no murmurs, rubs or gallops Lungs: CTA anteriorly, on vent Abdomen: Soft, non-distended, +BS Extremities: no edema b/l lower extremties Neuro: follows some simple commands Dialysis Access:  LUE AVF + bruit    Dialysis Orders: SW MWF 3h 66mn  80kg   400/800   2/2 bath  Hep 2000  LFA AVF - last HD 11/13, post 80.1kg - mircera 150 q2, last 11/13 - hectorol 466m IV q HD  Assessment/Plan: 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 SP course of hypertonic fluids. Will use high Na+ 145 w/ HD for now per neuro request to keep Na+ in normal range (135-145). Na+ 135 today.   HTN/ vol  - getting amlodipine/ coreg/amlodipine per tube. No edema on exam, BP's high-normal. Tolerated 3 L UF last 2 sessions. Now is 2kg under. Cont to lower vol w/ HD this week a bit more.  Hyperkalemia - recurrent issue--> TF's were changed to Nepro AMS - due to CVA/ bleed, improved off sedation/ vent ESRD - pt required CRRT 11/15-11/17/23. Getting HD MWF now. Next HD Monday.  HD access - BUN was very high so access recirc was suspected. Using temp HD cath placed 11/27 by CCM and BUN better. IR consulted for fistulogram.  Resp Failure - off the vent, doing well Anemia of ESRD: Hgb 9.3, continue Aranesp 15075mweekly while here. Secondary HPTH: CCa ok. Phos 8.3, started renvela per NG tube. Continue VDRA.  DM2 - per pmd  GOC - full code  RobKelly SplinterD 09/12/2022, 11:56 PM  Recent Labs  Lab 09/11/22 0640 09/12/22 0336  HGB 10.0*  9.9*  ALBUMIN 2.9* 2.8*  CALCIUM 9.3 9.2  PHOS 3.4 4.0  CREATININE 4.68* 6.70*  K 3.7 4.1     Inpatient medications:  amLODipine  5 mg Per Tube BID   carvedilol  25 mg Per Tube BID WC   Chlorhexidine Gluconate Cloth  6 each Topical Q0600   [START ON 09/13/2022] darbepoetin (ARANESP) injection - DIALYSIS  150 mcg Subcutaneous Q Mon-1800   feeding supplement (PROSource TF20)  60 mL Per Tube BID   heparin injection (subcutaneous)  5,000 Units Subcutaneous Q8H   hydrALAZINE  100 mg Per Tube Q8H   insulin aspart  0-15 Units Subcutaneous Q6H   insulin glargine-yfgn  10 Units Subcutaneous Daily   multivitamin  1 tablet Per Tube QHS   nutrition supplement (JUVEN)  1 packet Per Tube BID BM   mouth rinse  15 mL Mouth Rinse 4 times per day   pantoprazole (PROTONIX) IV  40 mg Intravenous Q12H   QUEtiapine  25 mg Oral Once   QUEtiapine  50 mg Per NG tube BID   sevelamer carbonate  2.4 g Per Tube TID WC    sodium chloride Stopped (08/30/22 0033)   feeding supplement (NEPRO CARB STEADY) 50 mL/hr at 09/12/22 1400   sodium chloride, acetaminophen (TYLENOL) oral liquid 160  mg/5 mL, bisacodyl, fentaNYL (SUBLIMAZE) injection, heparin, hydrALAZINE, labetalol, lidocaine (PF), lidocaine-prilocaine, ondansetron (ZOFRAN) IV, mouth rinse, pentafluoroprop-tetrafluoroeth

## 2022-09-12 NOTE — Assessment & Plan Note (Signed)
Glucose well controlled - Continue glargine - Continue sliding scale corrections

## 2022-09-12 NOTE — Progress Notes (Signed)
Palliative:  Chart review completed.  PMT continues to chart check and meet with family as appropriate.  Conference with attending, bedside nursing staff and transition of care team related to the above assessment and plan.   Plan: At this point he remains full scope/full code.  Although family endorses that quality of life is important to Randy Ross, they feel that more time is needed for outcomes.  No charge Randy Axe, NP Palliative Medicine Team  Team Phone [phone] Greater than 50% of this time was spent counseling and coordinating care related to the above assessment and plan.

## 2022-09-12 NOTE — Progress Notes (Signed)
TRH night cross cover note:  I was notified by RN that this patient is agitated, pulling at their telemetry leads and lines, including IJ, and attempting to get out of bed, with these behaviors refractory to attempts at verbal redirection.  In the setting of associated interference with ongoing medical treatment posing potential harm to themself, I have placed orders for soft bilateral wrist restraints.    Babs Bertin, DO Hospitalist

## 2022-09-12 NOTE — Assessment & Plan Note (Addendum)
Due to aspiration pneumonia and cerebellar stroke.  Resolved.

## 2022-09-12 NOTE — Progress Notes (Signed)
  Progress Note   Patient: Randy Ross KLK:[id number] DOB: Jul 12, 1971 DOA: 08/24/2022     19 DOS: the patient was seen and examined on 09/12/2022 at 8:48AM      Brief hospital course: [name] is a 51 y.o. M with ESRD on HD, DM, and HTN who presented with sudden onset slurred speech, headache.  In the ER, BP 260/140 mmHg.  CT head showed cerebellar hemorrhage.     11/14: Admitted for acute cerebellar hemorrhage; intubated in the ER, started on hypertonic saline 11/15: Neurosurgery were consulted, recommended against surgical evacuation 11/16: Cefepime started for suspected aspiration 11/17: Required 1u PRBC transfusion due to CRRT filter 11/19: EEG nonspecific 11/21: MRI brain shows new scattered bilateral acute infarcts 11/22: Completed 7 days antibiotics 11/24: IR consulted given recirculation syndrome, high BUN; plan for fistulogram pending 11/27: Temp cath HD cath placed  11/30: Self-extubated 12/1: Persistent delirium 12/2: Transferred OOU     Assessment and Plan: * Acute cerebellar hemorrhage ([place]) Etiology hypertensive most likely.  -Echocardiogram showed no cardiogenic source of embolism -Carotid CT angiography unremarkable -Lipids ordered: LDL 90s -Aspirin contraindicated -Atrial fibrillation: not present on monitoring -SLP ordered -PT eval will be ordered when following commands more consistently   Dysphagia Due to persistent encephalopathy - Continue tube feeds  Lower GI bleeding This was transient, noted by nursing, no change in hemoglobin, no further bleeding.  Acute embolic stroke ([place]) MRI brain 11/21 showed many small new acute infarcts throughout bilateral frontal and parietal white matter, consistent with embolism -Consult nephrology  Cerebral edema ([place]) Treated with hypertonic saline, now resolved.  Acute metabolic encephalopathy and delirium Patient still does not follow commands. - Continue quetiapine  Pressure injury of  skin     Hypertensive emergency Essential Hypertension BP elevated - Continue amlodipine, Coreg, hydralazine - Defer addition of fourth agent to Neurology and Nephrology  Acute respiratory failure with hypoxia ([place]) Due to aspiration pneumonia and cerebellar stroke.  Resolved.  Diabetes mellitus (HCC) Glucose well controlled - Continue glargine - Continue sliding scale corrections  ESRD (end stage renal disease) ([place]) - Consult nephrology - Continue aranesp  Anemia Hemoglobin stable, no further clinical bleeding.          Subjective: No nursing concerns.  The patient remains encephalopathic and unable to communicate.  No obvious respiratory distress and no fever.     Physical Exam: BP (!) 156/74   Pulse 86   Temp 97.8 F (36.6 C) (Axillary)   Resp 18   Ht '5\' 5"'$  (1.651 m)   Wt 78 kg   SpO2 95%   BMI 28.62 kg/m   Adult male, lying in bed, restraints in place, opens eyes transiently, but does not consistently make eye contact, does not follow commands.  Seems to have more strength on the left side. RRR, no murmurs, no peripheral edema Respiratory rate normal, lungs clear without rales or wheezes  Data Reviewed: CBC shows persistent leukocytosis, mild anemia, thrombocytosis Renal function panel shows normal sodium and potassium, creatinine 6.7 and BUN up to 120 in the setting of recirculation syndrome  Family Communication: None present    Disposition: Status is: Inpatient         Author: Edwin Dada, MD 09/12/2022 3:19 PM  For on call review [email or web address].

## 2022-09-12 NOTE — Assessment & Plan Note (Signed)
Treated with hypertonic saline, now resolved.

## 2022-09-12 NOTE — Assessment & Plan Note (Signed)
-   Consult nephrology - Continue aranesp

## 2022-09-12 NOTE — Progress Notes (Addendum)
STROKE TEAM PROGRESS NOTE  INTERVAL HISTORY [name] is a 51 y.o. male with history of end-stage renal disease on intermittent hemodialysis, diabetes, hypertension presented with  witnessed onset slurred speech, with headache at 10:30 PM for which EMS was activated.  On their arrival they report blood pressure was 260/140, glucose 126.  11/15 CT Head with acute IPH in R cerebellar hemisphere with severe mass effect of 4th ventricle and cerebral aqueduct. CTA Head/Neck without e/o vascular lesion at R cerebellar hemorrhage. CRRT/HTS 23% given. 11/17 Repeat CT: small acute infarct at R corpus callosum body/cingulate white matter, stable R cerebellar hematoma. 11/30 Self-extubated.   12/1: Patient continues to be restless and spontaneously move all extremities, b/l mitts in place. Patient has had multiple bloody BMs, CBC checked and protonix increased to BID. Per palliative care, family meeting held today with family stating they would want reintubation/trach if needed and that patient is to remain a FULL CODE.   12/2: prn BP meds required overnight. No prns for agitation. Still non verbal. Does not follow commands. Will localize to pain and move all ext. No resp issues overnight.  Hydralazine increased to '100mg'$  TID. Transfer out of ICU to hospitalist team today.  12/3: prn labetalol required overnight. Still non verbal. Does not follow commands. Will localize to pain and move all ext. No resp issues overnight. Awaiting hospital bed out of ICU  Vitals:   09/12/22 0534 09/12/22 0600 09/12/22 0700 09/12/22 0800  BP: 133/62 (!) 153/72 (!) 154/85 (!) 148/68  Pulse:  84 76 80  Resp:  (!) 21 (!) 21 (!) 21  Temp:      TempSrc:      SpO2:  96% 93% 96%  Weight:      Height:       CBC:  Recent Labs  Lab 09/11/22 0640 09/12/22 0336  WBC 18.8* 14.3*  HGB 10.0* 9.9*  HCT 31.3* 29.9*  MCV 70.8* 69.1*  PLT 822* 716*   Basic Metabolic Panel:  Recent Labs  Lab 09/11/22 0640  09/12/22 0336  NA 137 135  K 3.7 4.1  CL 99 96*  CO2 25 24  GLUCOSE 136* 118*  BUN 60* 121*  CREATININE 4.68* 6.70*  CALCIUM 9.3 9.2  MG 2.0 2.4  PHOS 3.4 4.0   Lipid Panel:  Recent Labs  Lab 09/11/22 0640  TRIG 55   HgbA1c:  No results for input(s): "HGBA1C" in the last 168 hours.  Urine Drug Screen: No results for input(s): "LABOPIA", "COCAINSCRNUR", "LABBENZ", "AMPHETMU", "THCU", "LABBARB" in the last 168 hours.  Alcohol Level  No results for input(s): "ETH" in the last 168 hours.   IMAGING past 24 hours No results found.  PHYSICAL EXAM  Temp:  [98.1 F (36.7 C)-99.2 F (37.3 C)] 98.4 F (36.9 C) (12/02 2300) Pulse Rate:  [73-90] 80 (12/03 0800) Resp:  [14-25] 21 (12/03 0800) BP: (113-170)/(58-100) 148/68 (12/03 0800) SpO2:  [92 %-98 %] 96 % (12/03 0800) Weight:  [78 kg] 78 kg (12/03 0500)  General - Well nourished, well developed, on room air. Cortrak in place.  Cardiovascular - Regular rate  Respiratory - Unlabored, room air  Neuro: Will awake with sternal rub and localized with right UE. Eyes open, not following commands. PERRL, not tracking but will look at examiner on stimulation. Moves all extremities spontaneously with purposeful movement of UE away from painful stimuli (sternal rub)  ASSESSMENT/PLAN [name] is a 51 y.o. male with history of end-stage renal disease on intermittent  hemodialysis, diabetes, hypertension presented with  witnessed onset slurred speech, with headache at 10:30 PM for which EMS was activated.  On their arrival they report blood pressure was 260/140, glucose 126.  He was found to have a right sided cerebellar ICH.   12/1: EKG was ordered to evaluate QT interval before starting patient on seroquel to help with continued restlessness and agitation. transfer out 12/2 and to hospitalist team.   12/2: no agitation overnight. Hydralazine increased. Out of ICU today and transfer to internal medicine. Seroquel addition  helped yesterday with agitation.  12/3 no agitation overnight. Patient still in ICU but awaiting transfer to non-ICU bed  ICH: Acute right cerebellar small ICH, likely due to chronic uncontrolled hypertension. However, given the embolic infarcts on MRI, hemorrhagic conversion at right cerebellar can not be ruled out Code Stroke CT head Acute ICH at the right cerebellar hemisphere with severe mass effect on the fourth ventricle and cerebral aqueduct. No hydrocephalus. CT repeat x 3 stable hematoma, no intraventricular spread or hydrocephalus. CT head and neck premature atherosclerosis with notable vessel tortuosity suggesting chronic hypertension 2D Echo EF 50 to 55% EEG- most likely toxic-metabolic moderate to severe diffuse encephalopathy. No seizures LDL 91 HgbA1c 5.9 VTE prophylaxis - heparin subq No antithrombotic prior to admission, now on No antithrombotic.  Therapy recommendations:  pending Disposition:  pending  Cerebral Edema Hyponatremia, resolved On 3% saline protocol -> off now due to fluid status Last Na 135 23.4% bolus ordered 11/29 11/23 CT stable ICH Na goal normotensive  Stroke: embolic shower vs. bilateral watershed distribution, etiology unclear, could be cardioembolic vs. hypoperfusion due to aggressive BP control MRI brain 11/21 no change in right cerebellar IPH, surrounding edema without hydrocephalus.  Multiple small infarcts in bilateral hemispheres, potentially embolic Questionable whether cerebellar ICH was hemorrhagic conversion Consider cardiac monitoring if neuro improves later  ESRD on HD HD MWF Renal Dr. Jonnie Ross following, appreciate help Had HD catheter CRRT stopped 11/17 due to severe anemia -> transition to HD  Acute hypoxic Respiratory failure  Pneumonia:  Self extubated 11/30, currently tolerating okay Trach aspirate - Klebseilla, strep and H.flu - off Ceftiaxone.  Lower GI bleeding 2 episode of BRBPR Seems to be self limited Hb  stable Close monitoring  GI consult if needed  Hypertension Home meds:  amlodipine 10 mg, carvedilol 25 mg, isosorbide mononitrate 30 mg, losartan 100 mg Stable off clevidipine On home amlodipine and carvedilol. On Hydralazine. SBP goal < 160 BP goal normotensive  Hyperlipidemia Home meds:  none LDL 91 goal < 70 Consider statin at discharge  Diabetes type II Controlled Home meds: Semglee HgbA1c 5.9, goal < 7.0 CBGs SSI  Dysphagia  SLP swallow eval OG tube and cortrak placed Con't TF's. May need PEG if no improvement.   Leukocytosis, improving No notable  WBC 19.5->17.7->19.9->16.1->21.2>18.8>14.3 Afebrile. Continue to monitor  CCM signed off 12/2  Chronic anemia Thormbocytosis  Hgb 8.7->9.3>9.9 Platelet 690->798->765->811>882>770  Hospital day # 19  ATTENDING ATTESTATION:  No significant changes.  Agitation is improved.  Exam is stable from yesterday.  Now on hospitalist team and awaiting a bed off the floor.  Start heparin for DVT prophylaxis.  Continue to hold antiplatelet for now even though the hemorrhage is now a day 19 due to low hemoglobin.  ADDENDUM: ICU nurse contacted me about agitation/getting out of bed and posey vest. I opted to increase seroquel first but can add posey vest if needed.    Dr. Reeves Forth evaluated pt independently, reviewed imaging, chart,  labs. Discussed and formulated plan with the Resident/APP. Changes were made to the note where appropriate. Please see APP/resident note above for details.        To contact Stroke Continuity provider, please refer to [email or web address] After hours, contact General Neurology

## 2022-09-12 NOTE — Assessment & Plan Note (Signed)
Essential Hypertension BP elevated - Continue amlodipine, Coreg, hydralazine - Defer addition of fourth agent to Neurology and Nephrology

## 2022-09-12 NOTE — Progress Notes (Signed)
Received this patient from ICU and his tube feed is at 15 cm and I called the nurse to ask about it, as her assessment was at 71 cm at 8 AM but it is out. I did not remove it because when this happens, I was told to keep tube in, if it is not obstructing breathing or interfering with anything because it may be able to be retraced and advanced when dietician is able to [place].  I did ask Dr Loleta Books for an order for the cortrak to be replaced/advanced, as he cannot receive anything with it this far out and now will be strictly NPO. He has a bridle on his cortrak and he has on mittens. Pt is calm and resting at this time.  Nurse did say pt had a waist restraint previously but MD did not want to renew the order.  He wanted to wait to see if he really needed it.  Michela Pitcher he was ordering more meds to help.  Pt has been calm and resting since here.  HOB is lowered and he is asleep on the pillow.  Bed in lowest position and all fall risk precautions in place (middle alarm on, yellow arm band on, gripper socks on, mats on floor on both sides of bed)

## 2022-09-12 NOTE — Assessment & Plan Note (Signed)
Patient still does not follow commands. - Continue quetiapine

## 2022-09-12 NOTE — Assessment & Plan Note (Signed)
Hemoglobin stable, no further clinical bleeding.

## 2022-09-12 NOTE — Progress Notes (Signed)
Pt pulled the tube the rest of the way out of his nose, as the tube was dangling in front of his mouth.

## 2022-09-12 NOTE — Assessment & Plan Note (Signed)
This was transient, noted by nursing, no change in hemoglobin, no further bleeding.

## 2022-09-12 NOTE — Assessment & Plan Note (Signed)
Etiology hypertensive most likely.  -Echocardiogram showed no cardiogenic source of embolism -Carotid CT angiography unremarkable -Lipids ordered: LDL 90s -Aspirin contraindicated -Atrial fibrillation: not present on monitoring -SLP ordered -PT eval will be ordered when following commands more consistently

## 2022-09-13 ENCOUNTER — Inpatient Hospital Stay (HOSPITAL_COMMUNITY): Payer: No Typology Code available for payment source

## 2022-09-13 DIAGNOSIS — Z7189 Other specified counseling: Secondary | ICD-10-CM | POA: Diagnosis not present

## 2022-09-13 DIAGNOSIS — I614 Nontraumatic intracerebral hemorrhage in cerebellum: Secondary | ICD-10-CM | POA: Diagnosis not present

## 2022-09-13 DIAGNOSIS — Z515 Encounter for palliative care: Secondary | ICD-10-CM | POA: Diagnosis not present

## 2022-09-13 HISTORY — PX: IR DIALY SHUNT INTRO NEEDLE/INTRACATH INITIAL W/IMG LEFT: IMG6102

## 2022-09-13 LAB — GLUCOSE, CAPILLARY
Glucose-Capillary: 113 mg/dL — ABNORMAL HIGH (ref 70–99)
Glucose-Capillary: 109 mg/dL — ABNORMAL HIGH (ref 70–99)
Glucose-Capillary: 124 mg/dL — ABNORMAL HIGH (ref 70–99)
Glucose-Capillary: 123 mg/dL — ABNORMAL HIGH (ref 70–99)

## 2022-09-13 LAB — CBC
HCT: 30.3 % — ABNORMAL LOW (ref 39.0–52.0)
Hemoglobin: 10.2 g/dL — ABNORMAL LOW (ref 13.0–17.0)
MCH: 22.9 pg — ABNORMAL LOW (ref 26.0–34.0)
MCHC: 33.7 g/dL (ref 30.0–36.0)
MCV: 68.1 fL — ABNORMAL LOW (ref 80.0–100.0)
Platelets: 728 10*3/uL — ABNORMAL HIGH (ref 150–400)
RBC: 4.45 MIL/uL (ref 4.22–5.81)
RDW: 22.2 % — ABNORMAL HIGH (ref 11.5–15.5)
WBC: 14.1 10*3/uL — ABNORMAL HIGH (ref 4.0–10.5)
nRBC: 0 % (ref 0.0–0.2)

## 2022-09-13 LAB — RENAL FUNCTION PANEL
Albumin: 3 g/dL — ABNORMAL LOW (ref 3.5–5.0)
Anion gap: 20 — ABNORMAL HIGH (ref 5–15)
BUN: 168 mg/dL — ABNORMAL HIGH (ref 6–20)
CO2: 20 mmol/L — ABNORMAL LOW (ref 22–32)
Calcium: 9.2 mg/dL (ref 8.9–10.3)
Chloride: 93 mmol/L — ABNORMAL LOW (ref 98–111)
Creatinine, Ser: 9.4 mg/dL — ABNORMAL HIGH (ref 0.61–1.24)
GFR, Estimated: 6 mL/min — ABNORMAL LOW (ref >60)
Glucose, Bld: 114 mg/dL — ABNORMAL HIGH (ref 70–99)
Phosphorus: 5.1 mg/dL — ABNORMAL HIGH (ref 2.5–4.6)
Potassium: 4.2 mmol/L (ref 3.5–5.1)
Sodium: 133 mmol/L — ABNORMAL LOW (ref 135–145)

## 2022-09-13 LAB — MAGNESIUM: Magnesium: 2.4 mg/dL (ref 1.7–2.4)

## 2022-09-13 MED ORDER — FENTANYL CITRATE (PF) 100 MCG/2ML IJ SOLN
INTRAMUSCULAR | Status: AC
  Filled 2022-09-13: qty 2

## 2022-09-13 MED ORDER — HEPARIN SODIUM (PORCINE) 1000 UNIT/ML IJ SOLN
INTRAMUSCULAR | Status: AC
  Administered 2022-09-13: 2.88 [IU]
  Filled 2022-09-13: qty 4

## 2022-09-13 MED ORDER — IOHEXOL 300 MG/ML  SOLN
100.0000 mL | Freq: Once | INTRAMUSCULAR | Status: AC | PRN
  Administered 2022-09-13: 40 mL via INTRAVENOUS

## 2022-09-13 MED ORDER — MIDAZOLAM HCL 2 MG/2ML IJ SOLN
INTRAMUSCULAR | Status: AC
  Filled 2022-09-13: qty 2

## 2022-09-13 MED ORDER — CHLORHEXIDINE GLUCONATE CLOTH 2 % EX PADS
6.0000 | MEDICATED_PAD | Freq: Every day | CUTANEOUS | Status: DC
  Administered 2022-09-13 – 2022-09-21 (×9): 6 via TOPICAL

## 2022-09-13 NOTE — Hospital Course (Signed)
Cards master? Watchman device?

## 2022-09-13 NOTE — Progress Notes (Signed)
Randy Ross Progress Note   Subjective:   seen in room.  Had fistulogram this AM - patent but poorly mature.  No interventions. PT awake but not responding to questions.  Requiring wrist restraints.   Objective Vitals:   09/13/22 0000 09/13/22 0349 09/13/22 0809 09/13/22 1021  BP: (!) 159/88 (!) 148/96 (!) 148/92 (!) 191/88  Pulse: 78 85 87 87  Resp: '18 18 18 '$ (!) 26  Temp: 97.9 F (36.6 C) 97.8 F (36.6 C) 97.7 F (36.5 C)   TempSrc: Oral Oral Oral   SpO2: 96% 95% 99% 97%  Weight:      Height:       Physical Exam General: extubated, NG tube in  Heart: RRR, no murmurs, rubs or gallops Lungs: CTA anteriorly, on vent Abdomen: Soft, non-distended, +BS Extremities: no edema b/l lower extremties Neuro: follows some simple commands Dialysis Access:  LUE AVF + bruit    Dialysis Orders: SW MWF 3h 68mn  80kg   400/800   2/2 bath  Hep 2000  LFA AVF - last HD 11/13, post 80.1kg - mircera 150 q2, last 11/13 - hectorol 476m IV q HD  Assessment/Plan: 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 S/P course of hypertonic fluids. Using high Na+ 145 w/ HD for now per neuro request to keep Na+ in normal range (135-145). Na+ 133 today.   HTN/ vol  - getting amlodipine/ coreg/amlodipine per tube, also has PRNs ordered. No edema on exam, BP's variable. Tolerated 3 L UF last 2 sessions. Now is 2kg under. Cont to lower vol w/ HD this week a bit more.  Hyperkalemia - recurrent issue--> TF's were changed to Nepro and it's improved.  AMS - due to CVA/ bleed and now with delerium, improved off sedation/ vent ESRD - pt required CRRT 11/15-11/17/23. Getting HD MWF now. Next HD today.  HD access - BUN was very high so access recirc was suspected. Using temp HD cath placed 11/27 by CCM and BUN better. IR consulted for fistulogram - done 12/4 and noted to be small with competing veins.  Use HD for now - will need to change to tunneled line.   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 Secondary HPTH: CCa ok. Phos 8.3, started renvela per NG tube now at goal. Continue VDRA.  DM2 - per pmd  GOC - full code  LinJannifer Hick CarSouthwest [facility] LLC[facility] Pager [phone] Labs  Lab 09/12/22 [id number]/04/23 0722  HGB 9.9* 10.2*  ALBUMIN 2.8* 3.0*  CALCIUM 9.2 9.2  PHOS 4.0 5.1*  CREATININE 6.70* 9.40*  K 4.1 4.2     Inpatient medications:  amLODipine  5 mg Per Tube BID   carvedilol  25 mg Per Tube BID WC   Chlorhexidine Gluconate Cloth  6 each Topical Q0600   Chlorhexidine Gluconate Cloth  6 each Topical Q0600   darbepoetin (ARANESP) injection - DIALYSIS  150 mcg Subcutaneous Q Mon-1800   feeding supplement (PROSource TF20)  60 mL Per Tube BID   heparin injection (subcutaneous)  5,000 Units Subcutaneous Q8H   hydrALAZINE  100 mg Per Tube Q8H   insulin aspart  0-15 Units Subcutaneous Q6H   insulin glargine-yfgn  10 Units Subcutaneous Daily   multivitamin  1 tablet Per Tube QHS   nutrition supplement (JUVEN)  1 packet Per Tube BID BM   mouth rinse  15 mL Mouth  Rinse 4 times per day   pantoprazole (PROTONIX) IV  40 mg Intravenous Q12H   QUEtiapine  25 mg Oral Once   QUEtiapine  50 mg Per NG tube BID   sevelamer carbonate  2.4 g Per Tube TID WC    sodium chloride Stopped (08/30/22 0033)   feeding supplement (NEPRO CARB STEADY) 50 mL/hr at 09/12/22 1400   sodium chloride, acetaminophen (TYLENOL) oral liquid 160 mg/5 mL, bisacodyl, fentaNYL (SUBLIMAZE) injection, hydrALAZINE, labetalol, ondansetron (ZOFRAN) IV, mouth rinse

## 2022-09-13 NOTE — Procedures (Signed)
Interventional Radiology Procedure Note  Procedure: Left arm dialysis AV fistulogram  Complications: None  Estimated Blood Loss: None  Findings: Contrast study of left arm AVF shows patent anastomosis between radial artery and cephalic vein. Cephalic outflow is relatively poorly mature after initial segment due to very prominent competing venous outflow into superficial venous branches and deep venous system. Central veins normally patent.  Venetia Night. Kathlene Cote, M.D Pager:  [phone]

## 2022-09-13 NOTE — Progress Notes (Signed)
Palliative: Attempt to see Randy Ross but he was off the floor.  I returned to find him present in the room.  He is alert, eyes open and looking around the room  He will somewhat make eye contact.  He does not respond in any meaningful way to voice or touch.  He clearly cannot make his needs known.  There is no family present at bedside at this time.  Bedside tech is present attending to needs.   He remains stable from a respiratory standpoint.  He did remove his core track, and is to be evaluated by speech therapy.  Conference with attending, bedside nursing staff, transition of care team related to patient condition, needs, goals of care, disposition.  Plan: At this point continue full scope/full code.  Time for outcomes.  It seems that he will need short-term rehab versus LTAC due to HD.  PMT to continue to follow.  25 minutes Randy Axe, NP Palliative medicine team Team phone [phone] Greater than 50% of this time was spent counseling and coordinating care related to the above assessment and plan.

## 2022-09-13 NOTE — Progress Notes (Signed)
Received patient in bed to unit.  Alert and oriented.  Informed consent signed and in chart.   Treatment initiated: 1528 Treatment completed: 1910  Patient tolerated well.  Transported back to the room  Alert, without acute distress.  Hand-off given to patient's nurse.   Access used: Fistula is not patent. MD authorized to use catheter  Total UF removed: 2400 Post HD weight: 76.1KG    09/13/22 1910  Vitals  Temp 98.3 F (36.8 C)  Temp Source Oral  BP (!) 210/91  MAP (mmHg) 120  BP Location Right Arm  BP Method Automatic  Patient Position (if appropriate) Lying  Pulse Rate 70  Pulse Rate Source Monitor  ECG Heart Rate 93  Resp (!) 22  Oxygen Therapy  SpO2 (!) 89 %  O2 Device Room Air  Patient Activity (if Appropriate) In bed  Pulse Oximetry Type Continuous  During Treatment Monitoring  HD Safety Checks Performed Yes  Intra-Hemodialysis Comments Tolerated well;Tx completed  Post Treatment  Dialyzer Clearance Lightly streaked  Duration of HD Treatment -hour(s) 3 hour(s)  Liters Processed 60.3  Fluid Removed (mL) 2400 mL  Tolerated HD Treatment Yes  Post-Hemodialysis Comments HD goal met, tolerated HD well  Note  Observations PT alert  Fistula / Graft Left Forearm Arteriovenous fistula  Placement Date/Time: 12/10/21 1048   Placed prior to admission: No  Orientation: Left  Access Location: (c) Forearm  Access Type: Arteriovenous fistula  Site Condition No complications  Fistula / Graft Assessment Present;Bruit;Thrill  Status Flushed;Deaccessed  Drainage Description None  Hemodialysis Catheter Right Internal jugular Triple lumen Temporary (Non-Tunneled)  Placement Date/Time: 09/06/22 1330   Time Out: Correct patient;Correct site;Correct procedure  Maximum sterile barrier precautions: Hand hygiene;Cap;Mask;Sterile gown;Sterile gloves;Large sterile sheet  Site Prep: Chlorhexidine (preferred)  Local Anes...  Site Condition No complications  Blue Lumen Status  Flushed;Heparin locked;Dead end cap in place  Red Lumen Status Flushed;Heparin locked;Dead end cap in place  Purple Lumen Status N/A  Catheter fill solution Heparin 1000 units/ml  Catheter fill volume (Arterial) 1.2 cc  Catheter fill volume (Venous) 1.2  Dressing Type Transparent  Dressing Status Antimicrobial disc in place;Clean, Dry, Intact  Drainage Description None  Dressing Change Due 09/18/22  Post treatment catheter status Capped and [facility]

## 2022-09-13 NOTE — Progress Notes (Addendum)
STROKE TEAM PROGRESS NOTE  INTERVAL HISTORY Randy Ross is a 51 y.o. male with history of end-stage renal disease on intermittent hemodialysis, diabetes, hypertension presented with  witnessed onset slurred speech, with headache at 10:30 PM for which EMS was activated.  On their arrival they report blood pressure was 260/140, glucose 126.  11/15 CT Head with acute IPH in R cerebellar hemisphere with severe mass effect of 4th ventricle and cerebral aqueduct. CTA Head/Neck without e/o vascular lesion at R cerebellar hemorrhage. CRRT/HTS 23% given. 11/17 Repeat CT: small acute infarct at R corpus callosum body/cingulate white matter, stable R cerebellar hematoma. 11/30 Self-extubated.   12/1: Patient continues to be restless and spontaneously move all extremities, b/l mitts in place. Patient has had multiple bloody BMs, CBC checked and protonix increased to BID. Per palliative care, family meeting held today with family stating they would want reintubation/trach if needed and that patient is to remain a FULL CODE.   12/2: prn BP meds required overnight. No prns for agitation. Still non verbal. Does not follow commands. Will localize to pain and move all ext. No resp issues overnight.  Hydralazine increased to '100mg'$  TID. Transfer out of ICU to hospitalist team today.  12/3: prn labetalol required overnight. Still non verbal. Does not follow commands. Will localize to pain and move all ext. No resp issues overnight. Awaiting hospital bed out of ICU  12/4: prn labetalol overnight. agitated and pulled out NG tube, did not receive quetiapine - placed on soft restraints. Neuro exam unchanged. Out of ICU.  Vitals:   09/12/22 2000 09/13/22 0000 09/13/22 0349 09/13/22 0809  BP: (!) 189/83 (!) 159/88 (!) 148/96   Pulse: 84 78 85 87  Resp: '18 18 18 18  '$ Temp: 98.1 F (36.7 C) 97.9 F (36.6 C) 97.8 F (36.6 C) 97.7 F (36.5 C)  TempSrc: Axillary Oral Oral Oral  SpO2: 99% 96% 95% 99%  Weight:       Height:       CBC:  Recent Labs  Lab 09/12/22 0336 09/13/22 0722  WBC 14.3* 14.1*  HGB 9.9* 10.2*  HCT 29.9* 30.3*  MCV 69.1* 68.1*  PLT 770* 503*   Basic Metabolic Panel:  Recent Labs  Lab 09/12/22 0336 09/13/22 0722  NA 135 133*  K 4.1 4.2  CL 96* 93*  CO2 24 20*  GLUCOSE 118* 114*  BUN 121* 168*  CREATININE 6.70* 9.40*  CALCIUM 9.2 9.2  MG 2.4 2.4  PHOS 4.0 5.1*   Lipid Panel:  Recent Labs  Lab 09/11/22 0640  TRIG 55   HgbA1c:  No results for input(s): "HGBA1C" in the last 168 hours.  Urine Drug Screen: No results for input(s): "LABOPIA", "COCAINSCRNUR", "LABBENZ", "AMPHETMU", "THCU", "LABBARB" in the last 168 hours.  Alcohol Level  No results for input(s): "ETH" in the last 168 hours.   IMAGING past 24 hours No results found.  PHYSICAL EXAM  Temp:  [97.7 F (36.5 C)-98.4 F (36.9 C)] 97.7 F (36.5 C) (12/04 0809) Pulse Rate:  [74-87] 87 (12/04 0809) Resp:  [16-25] 18 (12/04 0809) BP: (137-189)/(64-96) 148/96 (12/04 0349) SpO2:  [93 %-99 %] 99 % (12/04 0809)  General: in no acute distress HEENT: normocephalic and atraumatic Cardiovascular: regular rate Respiratory: normal respiratory effort and on RA Gastrointestinal: non-tender and non-distended Extremities: moving all extremities spontaneously  Mental Status: Randy Ross is awake but not interactive.  He can track you but does not follow commands.  He is mute. He was unable to  follow any commands. He withdraws to painful stimuli.  Cranial Nerves: II:  Visual fields grossly normal; pupils equal, round, reactive to light and accommodation III,IV, VI: no ptosis, extra-ocular motions intact bilaterally  Motor: Right : Upper extremity   4/5    Left:     Upper extremity   5/5  Lower extremity   3/5     Lower extremity   5/5  Tone and bulk: normal tone throughout; no atrophy noted  ASSESSMENT/PLAN Randy Ross is a 51 y.o. male with history of end-stage renal disease on  intermittent hemodialysis, diabetes, hypertension presented with  witnessed onset slurred speech, with headache at 10:30 PM for which EMS was activated.  On their arrival they report blood pressure was 260/140, glucose 126.  He was found to have a right sided cerebellar ICH.    ICH: Acute right cerebellar small ICH, likely due to chronic uncontrolled hypertension. However, given the embolic infarcts on MRI, hemorrhagic conversion at right cerebellar can not be ruled out Code Stroke CT head Acute ICH at the right cerebellar hemisphere with severe mass effect on the fourth ventricle and cerebral aqueduct. No hydrocephalus. CT repeat x 3 stable hematoma, no intraventricular spread or hydrocephalus. CT head and neck premature atherosclerosis with notable vessel tortuosity suggesting chronic hypertension 2D Echo EF 50 to 55% EEG- most likely toxic-metabolic moderate to severe diffuse encephalopathy. No seizures LDL 91 HgbA1c 5.9 VTE prophylaxis - heparin subq No antithrombotic prior to admission, now on  heparin subq .  Therapy recommendations: pending Disposition: likely SNF once medically stable Continue holding antiplatelet for now given anemia  Cerebral Edema On 3% saline protocol -> off now due to fluid status Last Na 133 23.4% bolus ordered 11/29 11/23 CT stable ICH Na goal normotensive  Stroke: embolic shower vs. bilateral watershed distribution, etiology unclear, could be cardioembolic vs. hypoperfusion due to aggressive BP control MRI brain 11/21 no change in right cerebellar IPH, surrounding edema without hydrocephalus.  Multiple small infarcts in bilateral hemispheres, potentially embolic Questionable whether cerebellar ICH was hemorrhagic conversion Continue cardiac monitoring  ESRD on HD HD MWF Renal Dr. Jonnie Finner following, appreciate help Had HD catheter CRRT stopped 11/17 due to severe anemia -> transition to HD  Acute hypoxic Respiratory failure  Pneumonia:  Self  extubated 11/30, currently tolerating okay Trach aspirate - Klebse[place], strep and H.flu - off Ceftiaxone.  Lower GI bleeding 2 episode of BRBPR Seems to be self limited Hb stable Close monitoring  GI consult if needed  Hypertension Home meds:  amlodipine 10 mg, carvedilol 25 mg, isosorbide mononitrate 30 mg, losartan 100 mg Stable off clevidipine On home amlodipine and carvedilol. On Hydralazine. SBP goal < 160 BP goal normotensive  Hyperlipidemia Home meds:  none LDL 91 goal < 70 Consider statin at discharge  Diabetes type II Controlled Home meds: Semglee HgbA1c 5.9, goal < 7.0 CBGs SSI  Dysphagia  NPO per SLP swallow eval Self-discontinued OG tube and cortrak - may need to be replaced Con't TF's when able. May need PEG if no improvement.   Leukocytosis, improving No notable  WBC 19.5 on admission now 14.1 Afebrile. Continue to monitor  CCM signed off 12/2  Chronic anemia Thormbocytosis  Hgb 8.7 on admission now 10.2 Platelet 690 on admission now [facility] day # 20      I have personally obtained history,examined this patient, reviewed notes, independently viewed imaging studies, participated in medical decision making and plan of care.ROS completed by me personally and pertinent  positives fully documented  I have made any additions or clarifications directly to the above note. Agree with note above.  Patient with severe uncontrolled hypertension with right cerebellar parenchymal hemorrhage as well as lacunar infarcts in the left.  Neurological exam remains poor and patient will likely need prolonged therapy and rehab.  Patient is not a good long-term anticoagulation candidate given his intracerebral hemorrhage and poorly controlled hypertension hence will not recommend further evaluation for cardiac source of embolism.  Stroke team will sign off.  Kindly call for questions.  Discussed with Dr. Loleta Books.  Greater than 50% time during his 3100 visit was spent in  counseling and coordination of care and discussion patient and care team and answering questions.  Antony Contras, MD Medical Director [facility] Pager: [phone] 09/13/2022 4:27 PM  To contact Stroke Continuity provider, please refer to [email or web address] After hours, contact General Neurology

## 2022-09-13 NOTE — Progress Notes (Signed)
Went off the floor to a procedure at 0915 so was unable to perform 1000 restraint check but did restart upon return from fistulogram.

## 2022-09-13 NOTE — Progress Notes (Signed)
Progress Note   Patient: Randy Ross [id number]:[id number] DOB: 1971/08/28 DOA: 08/24/2022     20 DOS: the patient was seen and examined on 09/13/2022 at 12:10PM      Brief hospital course: [name] is a 51 y.o. M with ESRD on HD, DM, and HTN who presented with sudden onset slurred speech, headache.  In the ER, BP 260/140 mmHg.  CT head showed cerebellar hemorrhage.     11/14: Admitted for acute cerebellar hemorrhage; intubated in the ER, started on hypertonic saline 11/15: Neurosurgery were consulted, recommended against surgical evacuation 11/16: Cefepime started for suspected aspiration 11/17: Required 1u PRBC transfusion due to CRRT filter 11/19: EEG nonspecific 11/21: MRI brain shows new scattered bilateral acute infarcts 11/22: Completed 7 days antibiotics 11/24: IR consulted given recirculation syndrome, high BUN; plan for fistulogram pending 11/27: Temp cath HD cath placed  11/30: Self-extubated 12/1: Persistent delirium 12/2: Transferred OOU 12/3: Pulled out cortrak 12/4: Failed SLP, needs cortrak     Assessment and Plan: * Acute cerebellar hemorrhage (HCC) Etiology hypertensive most likely.  -Echocardiogram showed no cardiogenic source of embolism -Carotid CT angiography unremarkable -Lipids ordered: LDL 90s -Aspirin contraindicated -Atrial fibrillation: not present on monitoring -SLP ordered -PT eval will be ordered when following commands more consistently   Dysphagia Due to persistent encephalopathy Pulled out Cortrak yesterday, RD team unable to place today.  Nursing will place NG - Continue tube feeds when NG back in place  Lower GI bleeding This was transient, noted by nursing, no change in hemoglobin, no further bleeding.  Acute embolic stroke ([place]) MRI brain 11/21 showed many small new acute infarcts throughout bilateral frontal and parietal white matter, consistent with embolism -Consult nephrology  Cerebral edema ([place]) Treated with  hypertonic saline, now resolved.  Acute metabolic encephalopathy and delirium Patient still does not follow commands. - Continue quetiapine  Pressure injury of skin     Hypertensive emergency Essential Hypertension BP elevated - Continue amlodipine, Coreg, hydralazine - Defer addition of fourth agent to Neurology and Nephrology  Acute respiratory failure with hypoxia ([place]) Due to aspiration pneumonia and cerebellar stroke.  Resolved.  Diabetes mellitus (HCC) Glucose well controlled - Continue glargine - Continue sliding scale corrections  ESRD (end stage renal disease) ([place]) - Consult nephrology - Continue aranesp  Anemia Hemoglobin stable, no further clinical bleeding.  Occluded fistula Fistulogram today - Per Nephrology and IR       Subjective: Patietn nonverbal.  Nursing concerned about lack of Cortrak.  No fever, no respiratory distress, no vomiting.  Does not consistently follow commands, I am uncertain if there is language barrier     Physical Exam: BP (!) 190/73   Pulse 94   Temp 97.8 F (36.6 C) (Oral)   Resp (!) 22   Ht '5\' 5"'$  (1.651 m)   Wt 81.4 kg   SpO2 98%   BMI 29.86 kg/m   Adult male, restraints in place, lying in bed, opens eyes and makes brief eye contact Seems to have some movement on both sides, this is sluggish and is not following commands RRR, no murmurs, no peripheral edema Respiratory rate normal, lungs clear without rales or wheezes  Data Reviewed: Glucose normal Basic metabolic panel shows normal sodium potassium White blood cell count 14, no change, platelets still greater than 700,000 Hemoglobin 10, no change  Family Communication: None present    Disposition: Status is: Inpatient         Author: Edwin Dada, MD 09/13/2022 5:04 PM  For  on call review [email or web address].

## 2022-09-13 NOTE — Progress Notes (Signed)
SLP Cancellation Note  Patient Details Name: Randy Ross MRN: [id number] DOB: Jan 26, 1971   Cancelled treatment:       Reason Eval/Treat Not Completed: Patient at procedure or test/unavailable (Pt was NPO for procedure and he is currently off unit for it. Pt's RN agreed to contact this SLP if he returns to the unit prior to going to HD.)  Brynden Thune I. Hardin Negus, [place], [place] Office number [phone]  [name] 09/13/2022, 10:25 AM

## 2022-09-13 NOTE — Evaluation (Signed)
Clinical/Bedside Swallow Evaluation Patient Details  Name: Randy Ross MRN: [id number] Date of Birth: 09-09-1971  Today's Date: 09/13/2022 Time: SLP Start Time (ACUTE ONLY): 56 SLP Stop Time (ACUTE ONLY): 1115 SLP Time Calculation (min) (ACUTE ONLY): 13 min  Past Medical History:  Past Medical History:  Diagnosis Date   Anemia    CKD (chronic kidney disease)    on wed 12/09/21   Diabetes mellitus without complication ([place])    type 2   Hypertension    Renal disorder    Past Surgical History:  Past Surgical History:  Procedure Laterality Date   AV FISTULA PLACEMENT Left 12/10/2021   Procedure: LEFT RADIAL CEPHALIC  ARTERIOVENOUS (AV) FISTULA;  Surgeon: Waynetta Sandy, MD;  Location: [facility];  Service: Vascular;  Laterality: Left;   IR DIALY SHUNT INTRO [place] W/IMG LEFT Left 09/13/2022   IR FLUORO GUIDE CV LINE RIGHT  12/09/2021   IR US GUIDE VASC ACCESS RIGHT  12/09/2021   LUMBAR [place] SURGERY  2021   L5-S1   HPI:  Pt is a 51 y.o. male who presented with sudden onset slurred speech, chest pain, left sided weakness. Surrounding edema in the face of the fourth ventricle without hydrocephalus. 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 ETT 11/14-11/30 (self-extubated). Palliative meeting 12/1: full scope. PMH: end-stage renal disease on intermittent hemodialysis, diabetes, hypertension.    Assessment / Plan / Recommendation  Clinical Impression   Pt was seen for bedside swallow evaluation. He was lethargic throughout the evaluation and did not communicate verbally. A complete oral mechanism exam could not be conduced due to pt's difficulty following commands; however, oral mucosa was moist and adequate, natural dentition noted. He presented with symptoms of oropharyngeal dysphagia characterized by reduced bolus awareness, oral holding, a suspected pharyngeal delay, and signs of aspiration with thin liquids, multiple  ice chips, and purees. Considering pt's prolonged (i.e., 16 days) intubation, CVA, and lethargy, SLP recommends that the pt's NPO status be maintained. Pt may have individual ice chips if he is alert. SLP will follow to assess improvement in swallow function.   SLP Visit Diagnosis: Dysphagia, unspecified (R13.10)    Aspiration Risk  Mild aspiration risk;Moderate aspiration risk    Diet Recommendation NPO;Alternative means - temporary   Medication Administration: Via alternative means    Other  Recommendations Oral Care Recommendations: Oral care BID    Recommendations for follow up therapy are one component of a multi-disciplinary discharge planning process, led by the attending physician.  Recommendations may be updated based on patient status, additional functional criteria and insurance authorization.  Follow up Recommendations  (Continued SLP services at level of care recommended by PT/OT)      Assistance Recommended at Discharge    Functional Status Assessment Patient has had a recent decline in their functional status and demonstrates the ability to make significant improvements in function in a reasonable and predictable amount of time.  Frequency and Duration min 2x/week  2 weeks       Prognosis Prognosis for Safe Diet Advancement: Good Barriers to Reach Goals: Cognitive deficits      Swallow Study   General Date of Onset: 09/12/22 HPI: Pt is a 51 y.o. male who presented with sudden onset slurred speech, chest pain, left sided weakness. Surrounding edema in the face of the fourth ventricle without hydrocephalus. 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 ETT 11/14-11/30 (self-extubated). Palliative meeting 12/1: full scope. PMH: end-stage renal disease on intermittent hemodialysis,  diabetes, hypertension. Type of Study: Bedside Swallow Evaluation Previous Swallow Assessment: none Diet Prior to this Study: NPO Temperature Spikes  Noted: No Respiratory Status: Room air History of Recent Intubation: Yes Length of Intubations (days): 16 days Date extubated: 09/09/22 Behavior/Cognition: Lethargic/Drowsy;Requires cueing Oral Cavity Assessment: Within Functional Limits Oral Care Completed by SLP: No Oral Cavity - Dentition: Adequate natural dentition Vision: Functional for self-feeding Self-Feeding Abilities: Total assist Patient Positioning: Upright in bed;Postural control adequate for testing Baseline Vocal Quality: Not observed Volitional Cough: Cognitively unable to elicit Volitional Swallow: Unable to elicit    Oral/Motor/Sensory Function Overall Oral Motor/Sensory Function:  (UTA)   Ice Chips Ice chips: Impaired Presentation: Spoon Oral Phase Impairments: Poor awareness of bolus Oral Phase Functional Implications: Oral holding Pharyngeal Phase Impairments: Change in Vital Signs;Suspected delayed Swallow   Thin Liquid Thin Liquid: Impaired Presentation: Spoon Oral Phase Impairments: Poor awareness of bolus Oral Phase Functional Implications: Oral holding Pharyngeal  Phase Impairments: Cough - Immediate;Suspected delayed Swallow    Nectar Thick Nectar Thick Liquid: Not tested   Honey Thick Honey Thick Liquid: Not tested   Puree Puree: Impaired Presentation: Spoon Oral Phase Impairments: Poor awareness of bolus Pharyngeal Phase Impairments: Throat Clearing - Delayed;Suspected delayed Swallow   Solid     Solid: Not tested     Tobie Poet I. Hardin Negus, [place], [place] Office number [phone]  [name] 09/13/2022,11:49 AM

## 2022-09-14 ENCOUNTER — Inpatient Hospital Stay (HOSPITAL_COMMUNITY): Payer: No Typology Code available for payment source

## 2022-09-14 DIAGNOSIS — Z7189 Other specified counseling: Secondary | ICD-10-CM | POA: Diagnosis not present

## 2022-09-14 DIAGNOSIS — I614 Nontraumatic intracerebral hemorrhage in cerebellum: Secondary | ICD-10-CM | POA: Diagnosis not present

## 2022-09-14 DIAGNOSIS — Z515 Encounter for palliative care: Secondary | ICD-10-CM | POA: Diagnosis not present

## 2022-09-14 LAB — GLUCOSE, CAPILLARY
Glucose-Capillary: 100 mg/dL — ABNORMAL HIGH (ref 70–99)
Glucose-Capillary: 113 mg/dL — ABNORMAL HIGH (ref 70–99)
Glucose-Capillary: 115 mg/dL — ABNORMAL HIGH (ref 70–99)
Glucose-Capillary: 96 mg/dL (ref 70–99)

## 2022-09-14 LAB — CBC
HCT: 30.1 % — ABNORMAL LOW (ref 39.0–52.0)
Hemoglobin: 9.5 g/dL — ABNORMAL LOW (ref 13.0–17.0)
MCH: 22.2 pg — ABNORMAL LOW (ref 26.0–34.0)
MCHC: 31.6 g/dL (ref 30.0–36.0)
MCV: 70.3 fL — ABNORMAL LOW (ref 80.0–100.0)
Platelets: 683 10*3/uL — ABNORMAL HIGH (ref 150–400)
RBC: 4.28 MIL/uL (ref 4.22–5.81)
RDW: 21.9 % — ABNORMAL HIGH (ref 11.5–15.5)
WBC: 10.9 10*3/uL — ABNORMAL HIGH (ref 4.0–10.5)
nRBC: 0 % (ref 0.0–0.2)

## 2022-09-14 LAB — RENAL FUNCTION PANEL
Albumin: 3 g/dL — ABNORMAL LOW (ref 3.5–5.0)
Anion gap: 14 (ref 5–15)
BUN: 81 mg/dL — ABNORMAL HIGH (ref 6–20)
CO2: 24 mmol/L (ref 22–32)
Calcium: 8.8 mg/dL — ABNORMAL LOW (ref 8.9–10.3)
Chloride: 99 mmol/L (ref 98–111)
Creatinine, Ser: 6.96 mg/dL — ABNORMAL HIGH (ref 0.61–1.24)
GFR, Estimated: 9 mL/min — ABNORMAL LOW (ref >60)
Glucose, Bld: 93 mg/dL (ref 70–99)
Phosphorus: 5 mg/dL — ABNORMAL HIGH (ref 2.5–4.6)
Potassium: 3.8 mmol/L (ref 3.5–5.1)
Sodium: 137 mmol/L (ref 135–145)

## 2022-09-14 LAB — TRIGLYCERIDES: Triglycerides: 88 mg/dL (ref <150)

## 2022-09-14 LAB — MAGNESIUM: Magnesium: 2.3 mg/dL (ref 1.7–2.4)

## 2022-09-14 MED ORDER — HEPARIN SODIUM (PORCINE) 1000 UNIT/ML IJ SOLN
INTRAMUSCULAR | Status: AC
  Administered 2022-09-14: 3000 [IU]
  Filled 2022-09-14: qty 3

## 2022-09-14 MED ORDER — CEFAZOLIN SODIUM-DEXTROSE 2-4 GM/100ML-% IV SOLN: 2.0000 g | INTRAVENOUS | Status: AC

## 2022-09-14 MED ORDER — HYDRALAZINE HCL 20 MG/ML IJ SOLN
20.0000 mg | Freq: Four times a day (QID) | INTRAMUSCULAR | Status: DC | PRN
  Administered 2022-09-15: 20 mg via INTRAVENOUS
  Filled 2022-09-14: qty 1

## 2022-09-14 NOTE — Progress Notes (Signed)
Surveyor, quantity reports to this RN that nephrologist okay with using catheter for dialysis treatment today

## 2022-09-14 NOTE — Progress Notes (Signed)
Progress Note   Patient: Randy Ross RAX:[id number] DOB: 11-Nov-1970 DOA: 08/24/2022     21 DOS: the patient was seen and examined on 09/14/2022 at 9:40AM      Brief hospital course: Randy Ross is a 51 y.o. M with ESRD on HD, DM, and HTN who presented with sudden onset slurred speech, headache.  In the ER, BP 260/140 mmHg.  CT head showed cerebellar hemorrhage.     11/14: Admitted for acute cerebellar hemorrhage; intubated in the ER, started on hypertonic saline 11/15: Neurosurgery were consulted, recommended against surgical evacuation 11/16: Cefepime started for suspected aspiration 11/17: Required 1u PRBC transfusion due to CRRT filter 11/19: EEG nonspecific 11/21: MRI brain shows new scattered bilateral acute infarcts 11/22: Completed 7 days antibiotics 11/24: IR consulted given recirculation syndrome, high BUN; plan for fistulogram pending 11/27: Temp cath HD cath placed  11/30: Self-extubated 12/1: Persistent delirium 12/2: Transferred OOU 12/3: Pulled out cortrak 12/4: Failed SLP, needs cortrak, fistulogram shows poor outflow 12/5: TDC placed, feeding tube replaced     Assessment and Plan: * Acute cerebellar hemorrhage ([place]) Etiology hypertensive most likely.  -Echocardiogram showed no cardiogenic source of embolism -Carotid CT angiography unremarkable -Lipids ordered: LDL 90s -Aspirin contraindicated -Atrial fibrillation: not present on monitoring -SLP ordered -PT eval will be ordered when following commands more consistently   Dysphagia Due to persistent encephalopathy Pulled out Cortrak 12/3, RD team unable to place 12/4.  Nursing replaced today. Given the severity of his stroke and encephaloapthy, the need for PEG is very high.  I would observe for another 2-3 days but if no clear progression towards oral intake, would arrange for PEG this week or next - Resume tube feeds - Patient will need PEG tube at any time.  Lower GI bleeding This was  transient, noted by nursing, no change in hemoglobin, no further bleeding.  Acute embolic stroke ([place]) MRI brain 11/21 showed many small new acute infarcts throughout bilateral frontal and parietal white matter, consistent with embolism -Consult nephrology  Cerebral edema ([place]) Treated with hypertonic saline, now resolved.  Acute metabolic encephalopathy and delirium Patient still does not follow commands. - Continue quetiapine  Pressure injury of skin     Hypertensive emergency Essential Hypertension BP extremely elevated overnight given Cortrak out and unable to give oral medications - Continue amlodipine, Coreg, hydralazine - Continue PRN hydralazine and labetaolol - Defer addition of fourth agent to Neurology and Nephrology  Acute respiratory failure with hypoxia ([place]) Due to aspiration pneumonia and cerebellar stroke.  Resolved.  Diabetes mellitus (HCC) Glucose well controlled  - Continue glargine - Continue sliding scale corrections  ESRD (end stage renal disease) ([place]) - Consult nephrology - Continue aranesp  Anemia Hemoglobin stable, no further clinical bleeding.  Clotted fistula Fistulogram showed poor outflow from fistula.  Nephrology to place [facility] today         Subjective: No nursing concerns.  No fever.     Physical Exam: BP (!) 150/74 (BP Location: Right Arm)   Pulse 100   Temp 99.4 F (37.4 C) (Axillary)   Resp 20   Ht '5\' 5"'$  (1.651 m)   Wt 76.1 kg   SpO2 100%   BMI 27.92 kg/m   Adult male, lying in bed, in restraints, opens eyes and makes eye contact, then looks away, seems to have movement on both sides, strength seems good, RRR, no murmurs, no peripheral edema Respiratory rate normal, lungs clear without rales or wheezes Abdomen without grimace to palpation or guarding No  spontaneous verbalizations, does not give any indication that he understands what I am saying, although he is clearly aware of my presence  Data Reviewed: Blood  cell count down to 10 Hemoglobin 9.5 and no change, platelets down to 069 Basic metabolic panel notable for renal failure    Family Communication: None present, no answer on phone    Disposition: Status is: Inpatient Patient presented with hypertensive emergency and intracranial hemorrhage  This is a massive insult, and his prognosis is extremely poor to have her return to being able to walk or talk again.  Likely will need PEG tube can discharge to SNF within the next week, versus hospice care        Author: Edwin Dada, MD 09/14/2022 5:47 PM  For on call review [email or web address].

## 2022-09-14 NOTE — Consult Note (Signed)
Chief Complaint: Patient was seen in consultation today for tunneled dialysis catheter placement Chief Complaint  Patient presents with   Code Stroke   at the request of Dr Leanora Cover   Supervising Physician: Markus Daft  Patient Status: [facility] - In-pt  History of Present Illness: Kamaron Belue is a 51 y.o. male   ESRD Left arm dialysis fistula 12/10/21 Difficult cannulation  Fistulogram yesterday: IMPRESSION: Patent anastomosis between the left radial artery and cephalic vein. After an initial dilated segment, cephalic vein outflow is relatively poorly matured beginning at the level of the mid forearm due to prominent competing venous outflow in the deep venous system and other superficial venous branches. ACCESS: No percutaneous intervention was indicated for the AV fistula.   HD access - BUN was very high so access recirc was suspected. Using temp HD cath placed 11/27 by CCM and BUN better. IR consulted for fistulogram - done 12/4 and noted to be small with competing veins.  Use HD for now - will need to change to tunneled line.   Dr Johnney Ou has requested tunneled dialysis catheter placement   Past Medical History:  Diagnosis Date   Anemia    CKD (chronic kidney disease)    on wed 12/09/21   Diabetes mellitus without complication ([place])    type 2   Hypertension    Renal disorder     Past Surgical History:  Procedure Laterality Date   AV FISTULA PLACEMENT Left 12/10/2021   Procedure: LEFT RADIAL CEPHALIC  ARTERIOVENOUS (AV) FISTULA;  Surgeon: Waynetta Sandy, MD;  Location: [facility];  Service: Vascular;  Laterality: Left;   IR DIALY SHUNT INTRO [place] W/IMG LEFT Left 09/13/2022   IR FLUORO GUIDE CV LINE RIGHT  12/09/2021   IR US GUIDE VASC ACCESS RIGHT  12/09/2021   LUMBAR [place] SURGERY  2021   L5-S1    Allergies: Gabapentin and Metformin and related  Medications: Prior to Admission medications   Medication Sig Start Date End Date Taking?  Authorizing Provider  acetaminophen (TYLENOL) 325 MG tablet Take 2 tablets (650 mg total) by mouth every 6 (six) hours as needed for moderate pain. Patient taking differently: Take 650 mg by mouth as needed for moderate pain or headache. 05/19/22  Yes Jaynee Eagles, PA-C  amLODipine (NORVASC) 10 MG tablet Take 10 mg by mouth at bedtime. 04/23/22  Yes [provider]  carvedilol (COREG) 25 MG tablet Take 25 mg by mouth daily. 05/31/22  Yes [provider]  Emollient (CETAPHIL) cream Apply 1 application  topically daily as needed (itching).   Yes [provider]  isosorbide mononitrate (IMDUR) 30 MG 24 hr tablet Take 30 mg by mouth at bedtime. 04/23/22  Yes [provider]  losartan (COZAAR) 100 MG tablet Take 100 mg by mouth daily. 03/15/22  Yes [provider]  PRESCRIPTION MEDICATION Inject 10 Units into the skin daily. [place]   Yes [provider]  calcitRIOL (ROCALTROL) 0.25 MCG capsule Take 1 capsule (0.25 mcg total) by mouth daily. 12/16/21   Elgergawy, Silver Huguenin, MD  calcium acetate (PHOSLO) 667 MG capsule Take 1,334 mg by mouth 3 (three) times daily. 03/31/22   [provider]  hydrALAZINE (APRESOLINE) 50 MG tablet Take 1 tablet (50 mg total) by mouth 3 (three) times daily. Patient not taking: Reported on 03/31/2022 12/15/21   Elgergawy, Silver Huguenin, MD  methocarbamol (ROBAXIN) 500 MG tablet Take 1 tablet (500 mg total) by mouth 2 (two) times daily. Patient not taking:  Reported on 08/25/2022 05/19/22   Jaynee Eagles, PA-C  multivitamin (RENA-VIT) TABS tablet Take 1 tablet by mouth at bedtime. 12/15/21   Elgergawy, Silver Huguenin, MD  SEMGLEE, YFGN, 100 UNIT/ML Pen Inject 10 Units into the skin daily. 04/23/22   [provider]  tetrahydrozoline-zinc (VISINE-AC) 0.05-0.25 % ophthalmic solution Place 1 drop into both eyes 3 (three) times daily as needed (dry eyes).    [provider]  tiZANidine (ZANAFLEX) 4 MG tablet Take 4 mg by mouth 3  (three) times daily as needed for muscle spasms. Patient not taking: Reported on 08/25/2022    [provider]     History reviewed. No pertinent family history.  Social History   Socioeconomic History   Marital status: Single    Spouse name: Not on file   Number of children: Not on file   Years of education: Not on file   Highest education level: Not on file  Occupational History   Not on file  Tobacco Use   Smoking status: Some Days    Packs/day: 0.15    Types: Cigarettes   Smokeless tobacco: Never  Vaping Use   Vaping Use: Never used  Substance and Sexual Activity   Alcohol use: Not Currently   Drug use: Not Currently   Sexual activity: Not Currently  Other Topics Concern   Not on file  Social History Narrative   ** Merged History Encounter **       Social Determinants of Health   Financial Resource Strain: Not on file  Food Insecurity: Not on file  Transportation Needs: Not on file  Physical Activity: Not on file  Stress: Not on file  Social Connections: Not on file    Review of Systems: A 12 point ROS discussed and pertinent positives are indicated in the HPI above.  All other systems are negative.  Vital Signs: BP (!) 156/82 (BP Location: Right Arm)   Pulse 91   Temp 98.7 F (37.1 C) (Oral)   Resp 18   Ht '5\' 5"'$  (1.651 m)   Wt 167 lb 12.3 oz (76.1 kg)   SpO2 98%   BMI 27.92 kg/m     Physical Exam Constitutional:      Comments: Non verbal Lying on bed; in NAD  Cardiovascular:     Rate and Rhythm: Normal rate and regular rhythm.  Pulmonary:     Breath sounds: Normal breath sounds.  Abdominal:     Palpations: Abdomen is soft.  Skin:    General: Skin is warm.  Psychiatric:     Comments: No repsonse     Imaging: IR DIALY SHUNT INTRO NEEDLE/INTRACATH INITIAL W/IMG LEFT  Result Date: 09/13/2022 CLINICAL DATA:  End-stage renal disease and status post left radiocephalic AV fistula creation on 12/10/2021. There has been difficulty  cannulating and using the fistula during hemodialysis. EXAM: DIALYSIS AV FISTULOGRAM COMPARISON:  None Available. CONTRAST:  59m OMNIPAQUE IOHEXOL 300 MG/ML  SOLN FLUOROSCOPY TIME:  6 seconds.  25.0 mGy. PROCEDURE: The left arm native fistula was prepped with chlorhexidine in a sterile fashion, and a sterile drape was applied covering the operative field. A diagnostic fistulogram was performed via an 18 gauge angiocatheter introduced into venous outflow. Venous drainage was assessed to the level of the central veins in the chest. Proximal fistula was studied by reflux maneuver with temporary compression of venous outflow. After the procedure, the angiocatheter was removed and hemostasis obtained with manual compression. COMPLICATIONS: None FINDINGS: Anastomosis between the radial artery and cephalic  vein at the level of the wrist is normally patent. Initial segment of the cephalic venous outflow is patent and shows some dilatation. At roughly the mid forearm level, there is prominent competing venous outflow into the deep venous system and several competing superficial venous branches. Venous outflow in the upper arm is roughly equal between the cephalic vein and deep venous system. Central venous outflow in the chest is normally patent. IMPRESSION: Patent anastomosis between the left radial artery and cephalic vein. After an initial dilated segment, cephalic vein outflow is relatively poorly matured beginning at the level of the mid forearm due to prominent competing venous outflow in the deep venous system and other superficial venous branches. ACCESS: No percutaneous intervention was indicated for the AV fistula. Electronically Signed   By: Aletta Edouard M.D.   On: 09/13/2022 10:56   CT HEAD WO CONTRAST (5MM)  Result Date: 09/08/2022 CLINICAL DATA:  Hemorrhagic stroke follow-up EXAM: CT HEAD WITHOUT CONTRAST TECHNIQUE: Contiguous axial images were obtained from the base of the skull through the vertex  without intravenous contrast. RADIATION DOSE REDUCTION: This exam was performed according to the departmental dose-optimization program which includes automated exposure control, adjustment of the mA and/or kV according to patient size and/or use of iterative reconstruction technique. COMPARISON:  Six days ago FINDINGS: Brain: Continued decreased conspicuity of high-density clot in the right cerebellum. A rim of edema appears similar to prior. Lower fourth ventricular narrowing without hydrocephalus. No new hemorrhage or infarct. Vascular: Atheromatous calcification and tortuosity peer Skull: No acute or aggressive finding Sinuses/Orbits: Extensive paranasal sinus and mastoid opacification in the setting of intubation. No acute orbital finding. IMPRESSION: 1. Expected evolution of right cerebellar hematoma. No hydrocephalus or new abnormality. 2. Extensive sinus and mastoid opacification in the setting of intubation. Electronically Signed   By: Jorje Guild M.D.   On: 09/08/2022 06:00   DG CHEST PORT 1 VIEW  Result Date: 09/07/2022 CLINICAL DATA:  Shortness of breath EXAM: PORTABLE CHEST 1 VIEW COMPARISON:  09/06/2022 FINDINGS: Cardiac shadow is enlarged but stable. Endotracheal tube and feeding catheter are again noted and stable. Right jugular temporary dialysis catheter is seen. No pneumothorax is noted. No focal infiltrate or effusion is noted. IMPRESSION: No acute abnormality noted. Electronically Signed   By: Inez Catalina M.D.   On: 09/07/2022 21:59   DG CHEST PORT 1 VIEW  Result Date: 09/06/2022 CLINICAL DATA:  Central line placement. EXAM: PORTABLE CHEST 1 VIEW COMPARISON:  August 31, 2022. FINDINGS: Stable cardiomegaly. Endotracheal and feeding tubes are in good position. Interval placement of right internal jugular catheter with distal tip in expected position of the SVC. Left lung is clear. Mild right basilar atelectasis or infiltrate is noted. IMPRESSION: Interval placement of right  internal jugular catheter with distal tip in expected position of the SVC. Mild right basilar atelectasis or infiltrate is noted. Electronically Signed   By: Marijo Conception M.D.   On: 09/06/2022 14:00   VAS [place] LOWER EXTREMITY VENOUS (DVT)  Result Date: 09/04/2022  Lower Venous DVT Study Patient Name:  CHET Hackenberg  Date of Exam:   09/04/2022 Medical Rec #: [id number]          Accession #:    [id number] Date of Birth: 31-Jan-1971         Patient Gender: M Patient Age:   68 years Exam Location:  [facility] Procedure:      VAS [place] LOWER EXTREMITY VENOUS (DVT) Referring Phys: Rosalin Hawking --------------------------------------------------------------------------------  Indications:  Stroke.  Risk Factors: None identified. Comparison Study: No prior studies. Performing Technologist: Oliver Hum RVT  Examination Guidelines: A complete evaluation includes B-mode imaging, spectral Doppler, color Doppler, and power Doppler as needed of all accessible portions of each vessel. Bilateral testing is considered an integral part of a complete examination. Limited examinations for reoccurring indications may be performed as noted. The reflux portion of the exam is performed with the patient in reverse Trendelenburg.  +---------+---------------+---------+-----------+----------+--------------+ RIGHT    CompressibilityPhasicitySpontaneityPropertiesThrombus Aging +---------+---------------+---------+-----------+----------+--------------+ CFV      Full           Yes      Yes                                 +---------+---------------+---------+-----------+----------+--------------+ SFJ      Full                                                        +---------+---------------+---------+-----------+----------+--------------+ FV Prox  Full                                                        +---------+---------------+---------+-----------+----------+--------------+ FV Mid   Full                                                         +---------+---------------+---------+-----------+----------+--------------+ FV DistalFull                                                        +---------+---------------+---------+-----------+----------+--------------+ PFV      Full                                                        +---------+---------------+---------+-----------+----------+--------------+ POP      Full           Yes      Yes                                 +---------+---------------+---------+-----------+----------+--------------+ PTV      Full                                                        +---------+---------------+---------+-----------+----------+--------------+ PERO     Full                                                        +---------+---------------+---------+-----------+----------+--------------+   +---------+---------------+---------+-----------+----------+--------------+  LEFT     CompressibilityPhasicitySpontaneityPropertiesThrombus Aging +---------+---------------+---------+-----------+----------+--------------+ CFV      Full           Yes      Yes                                 +---------+---------------+---------+-----------+----------+--------------+ SFJ      Full                                                        +---------+---------------+---------+-----------+----------+--------------+ FV Prox  Full                                                        +---------+---------------+---------+-----------+----------+--------------+ FV Mid   Full           Yes      Yes                                 +---------+---------------+---------+-----------+----------+--------------+ FV DistalFull                                                        +---------+---------------+---------+-----------+----------+--------------+ PFV      Full                                                         +---------+---------------+---------+-----------+----------+--------------+ POP      Full           Yes      Yes                                 +---------+---------------+---------+-----------+----------+--------------+ PTV      Full                                                        +---------+---------------+---------+-----------+----------+--------------+ PERO     Full                                                        +---------+---------------+---------+-----------+----------+--------------+     Summary: RIGHT: - There is no evidence of deep vein thrombosis in the lower extremity.  - No cystic structure found in the popliteal fossa.  LEFT: - There is no evidence of deep vein thrombosis in the lower extremity.  - No cystic structure found in the popliteal  fossa.  *See table(s) above for measurements and observations. Electronically signed by Harold Barban MD on 09/04/2022 at 8:50:02 PM.    Final    CT HEAD WO CONTRAST (5MM)  Result Date: 09/02/2022 CLINICAL DATA:  Stroke, follow up EXAM: CT HEAD WITHOUT CONTRAST TECHNIQUE: Contiguous axial images were obtained from the base of the skull through the vertex without intravenous contrast. RADIATION DOSE REDUCTION: This exam was performed according to the departmental dose-optimization program which includes automated exposure control, adjustment of the mA and/or kV according to patient size and/or use of iterative reconstruction technique. COMPARISON:  MR head 08/31/2022, CT head 08/29/2022 FINDINGS: Brain: Cerebral ventricle sizes are concordant with the degree of cerebral volume loss. Patchy and confluent areas of decreased attenuation are noted throughout the deep and periventricular white matter of the cerebral hemispheres bilaterally, compatible with chronic microvascular ischemic disease. 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 No evidence of large-territorial acute infarction. No new  parenchymal hemorrhage. Grossly stable 2.3 cm hyperdense round masslike lesion within the right cerebellum with associated vasogenic edema. No new mass identified. Possible extension into the fourth ventricle with no associated hydrocephalus. No mass effect or midline shift. No hydrocephalus. Basilar cisterns are patent. Vascular: No hyperdense vessel. Skull: No acute fracture or focal lesion. Sinuses/Orbits: Paranasal sinuses and mastoid air cells are clear. Right lens replacement. Otherwise the orbits are unremarkable. Other: Enteric and endotracheal tubes partially visualized. IMPRESSION: Grossly stable 2.3 cm hyperdense masslike lesion within the right cerebellum with associated vasogenic edema. Possible extension into the fourth ventricle with no associated hydrocephalus. Finding may represent a hemorrhagic mass. Electronically Signed   By: Iven Finn M.D.   On: 09/02/2022 17:03   MR BRAIN WO CONTRAST  Result Date: 08/31/2022 CLINICAL DATA:  Stroke, hemorrhagic EXAM: MRI HEAD WITHOUT CONTRAST TECHNIQUE: Multiplanar, multiecho pulse sequences of the brain and surrounding structures were obtained without intravenous contrast. COMPARISON:  CT head August 29, 2022. FINDINGS: Brain: When accounting for differences in technique, no substantial change is acute/recent intraparenchymal hemorrhage within the right cerebellum. Surrounding edema in the face of the fourth ventricle without hydrocephalus. Many small acute infarcts throughout bilateral frontal and parietal white matter and the corpus callosum, most confluent/large in the frontal white matter bilaterally. Mild associated edema without significant mass effect. No midline shift. No mass lesion. Evidence of prior hemorrhage in the right basal ganglia with additional scattered punctate foci of susceptibility artifact, compatible with prior microhemorrhages. Vascular: Major arterial flow voids are maintained at the skull base. Skull and upper cervical  spine: Normal marrow signal. Sinuses/Orbits: Extensive paranasal sinus disease. No acute orbital findings. Other: Bilateral mastoid effusions. IMPRESSION: 1. When accounting for differences in technique, no substantial change is acute/recent intraparenchymal hemorrhage within the right cerebellum. Surrounding edema in the face of the fourth ventricle without hydrocephalus. 2. Many small acute infarcts throughout bilateral frontal and parietal white matter and the corpus callosum, potentially embolic. 3. Evidence of prior basal ganglia hemorrhage and scattered chronic microhemorrhages, possibly hypertensive in etiology. Electronically Signed   By: Margaretha Sheffield M.D.   On: 08/31/2022 13:27   DG CHEST PORT 1 VIEW  Result Date: 08/31/2022 CLINICAL DATA:  Intubated patient.  Possible aspiration. EXAM: PORTABLE CHEST 1 VIEW COMPARISON:  Radiographs 08/27/2022 and 08/26/2022. FINDINGS: 0946 hours. The endotracheal tube, left IJ central venous catheter and feeding to appear unchanged in position. The tip of the latter is not visualized. The heart size and mediastinal contours are stable. Diffuse interstitial opacities throughout  both lungs have partially cleared in the interval. There is no confluent airspace opacity, pneumothorax or significant pleural effusion. The bones appear unchanged.  Telemetry leads overlie the chest. IMPRESSION: 1. Interval partial clearing of diffuse bilateral interstitial opacities, likely due to resolving edema. No new findings. 2. Stable support system. Electronically Signed   By: Richardean Sale M.D.   On: 08/31/2022 10:01   EEG adult  Result Date: 08/29/2022 Lora Havens, MD     08/29/2022 11:46 AM Patient Name: Elisandro Jarrett MRN: [id number] Epilepsy Attending: Lora Havens Referring Physician/Provider: Janine Ores, NP Date: 08/29/2022 Duration: 21.40 mins Patient history: 51yo M with ams. EEG to evaluate for seizure. Level of alertness:  lethargic AEDs during EEG  study: propofol Technical aspects: This EEG study was done with scalp electrodes positioned according to the 10-20 International system of electrode placement. Electrical activity was reviewed with band pass filter of 1-'70Hz'$ , sensitivity of 7 uV/mm, display speed of 34m/sec with a '60Hz'$  notched filter applied as appropriate. EEG data were recorded continuously and digitally stored.  Video monitoring was available and reviewed as appropriate. Description: EEG showed continuous generalized 3 to 6 Hz theta-delta slowing. Intermittent generalized periodic discharges with triphasic morphology at 1 Hz were also noted, more prominent when awake/stimulated. Hyperventilation and photic stimulation were not performed.   ABNORMALITY - Periodic discharges with triphasic morphology, generalized ( GPDs) - Continuous slow, generalized IMPRESSION: This study showed generalized periodic discharges with triphasic morphology which is on the ictal-interictal continuum. However, the frequency, morphology and reactivity to stimulation is most likely indicative of toxic-metabolic causes. . Additionally there is moderate to severe diffuse encephalopathy, nonspecific etiology. No seizures were seen throughout the recording. [place]  CT HEAD WO CONTRAST (5MM)  Result Date: 08/29/2022 CLINICAL DATA:  Intracranial hemorrhage EXAM: CT HEAD WITHOUT CONTRAST TECHNIQUE: Contiguous axial images were obtained from the base of the skull through the vertex without intravenous contrast. RADIATION DOSE REDUCTION: This exam was performed according to the departmental dose-optimization program which includes automated exposure control, adjustment of the mA and/or kV according to patient size and/or use of iterative reconstruction technique. COMPARISON:  None Available. FINDINGS: Brain: Unchanged intraparenchymal hemorrhage in the right cerebellar hemisphere with possible minimal extension into the fourth ventricle. No hydrocephalus.  Vascular: No abnormal hyperdensity of the major intracranial arteries or dural venous sinuses. No intracranial atherosclerosis. Skull: The visualized skull base, calvarium and extracranial soft tissues are normal. Sinuses/Orbits: No fluid levels or advanced mucosal thickening of the visualized paranasal sinuses. No mastoid or middle ear effusion. The orbits are normal. IMPRESSION: Unchanged intraparenchymal hemorrhage in the right cerebellar hemisphere with possible minimal extension into the fourth ventricle. No hydrocephalus. Electronically Signed   By: KUlyses JarredM.D.   On: 08/29/2022 03:44   CT HEAD WO CONTRAST (5MM)  Result Date: 08/27/2022 CLINICAL DATA:  Stroke follow-up EXAM: CT HEAD WITHOUT CONTRAST TECHNIQUE: Contiguous axial images were obtained from the base of the skull through the vertex without intravenous contrast. RADIATION DOSE REDUCTION: This exam was performed according to the departmental dose-optimization program which includes automated exposure control, adjustment of the mA and/or kV according to patient size and/or use of iterative reconstruction technique. COMPARISON:  Two days ago FINDINGS: Brain: Unchanged hematoma and edema in the right cerebellum approaching the fourth ventricle which is partially carotid inferiorly. No hydrocephalus. Low-density new from admission head CT beneath the right cingulate gyrus and at the right corpus callosum. Vascular: Atheromatous calcification. Skull: No acute or aggressive finding  Sinuses/Orbits: Generalized sinus inflammation in the setting of intubation. IMPRESSION: 1. Small acute infarct at the right corpus callosum body and cingulate white matter, not seen on admission head CT. 2. Non progressed right cerebellar hematoma and swelling. No hydrocephalus. Electronically Signed   By: Jorje Guild M.D.   On: 08/27/2022 12:09   DG CHEST PORT 1 VIEW  Result Date: 08/27/2022 CLINICAL DATA:  Hypoxia EXAM: PORTABLE CHEST 1 VIEW COMPARISON:   Radiograph 08/26/2022 FINDINGS: Endotracheal tube overlies the midthoracic trachea approximately 3.4 cm above the carina. Left neck approach catheter tip overlies the distal SVC. Feeding tube passes below the diaphragm, tip excluded by collimation. Unchanged enlarged cardiac silhouette. There are mild interstitial opacities. Improved aeration of the left lung base. There is no pleural effusion or pneumothorax. Bones are unchanged. IMPRESSION: Cardiomegaly with mild pulmonary edema. Improved aeration of the left lung base. Electronically Signed   By: Maurine Simmering M.D.   On: 08/27/2022 08:38   Portable Chest xray  Result Date: 08/26/2022 CLINICAL DATA:  Respiratory failure EXAM: PORTABLE CHEST 1 VIEW COMPARISON:  08/25/2022 FINDINGS: Endotracheal tube terminates 2.0 cm above the carina. Enteric tube courses below the diaphragm with distal tip beyond the inferior margin of the film. Dual lumen left IJ central venous catheter remains positioned at the superior cavoatrial junction. Stable cardiomegaly. Pulmonary vascular congestion. Persistent streaky bibasilar opacities. No large pleural fluid collection. No pneumothorax. IMPRESSION: 1. Cardiomegaly with pulmonary vascular congestion. 2. Persistent streaky bibasilar opacities, atelectasis versus infiltrate. Electronically Signed   By: Davina Poke D.O.   On: 08/26/2022 08:06   CT HEAD WO CONTRAST (5MM)  Result Date: 08/26/2022 CLINICAL DATA:  Hemorrhagic stroke EXAM: CT HEAD WITHOUT CONTRAST TECHNIQUE: Contiguous axial images were obtained from the base of the skull through the vertex without intravenous contrast. RADIATION DOSE REDUCTION: This exam was performed according to the departmental dose-optimization program which includes automated exposure control, adjustment of the mA and/or kV according to patient size and/or use of iterative reconstruction technique. COMPARISON:  08/25/2022 at 5:42 a.m. FINDINGS: Brain: Unchanged size of intraparenchymal  hematoma in the right cerebellum with mass effect on the fourth ventricle. No hydrocephalus. There is periventricular hypoattenuation compatible with chronic microvascular disease. Generalized volume loss. Vascular: No abnormal hyperdensity of the major intracranial arteries or dural venous sinuses. No intracranial atherosclerosis. Skull: The visualized skull base, calvarium and extracranial soft tissues are normal. Sinuses/Orbits: No fluid levels or advanced mucosal thickening of the visualized paranasal sinuses. No mastoid or middle ear effusion. The orbits are normal. IMPRESSION: Unchanged size of intraparenchymal hematoma in the right cerebellum with mass effect on the fourth ventricle. No hydrocephalus. Electronically Signed   By: Ulyses Jarred M.D.   On: 08/26/2022 00:21   DG Chest Port 1 View  Result Date: 08/25/2022 CLINICAL DATA:  Central line placement. EXAM: PORTABLE CHEST 1 VIEW COMPARISON:  Chest 08/25/2022 FINDINGS: Endotracheal tube has been advanced now 1 cm above the carina. Feeding tube enters the stomach with the tip at the gastric antrum or duodenal bulb. Left jugular dual lumen catheter extends into the SVC at the cavoatrial junction. No pneumothorax. Cardiac enlargement with mild vascular congestion unchanged. Mild bibasilar atelectasis unchanged. IMPRESSION: 1. Left jugular catheter tip at the cavoatrial junction. No pneumothorax. 2. Endotracheal tube 1 cm above the carina. 3. Mild bibasilar atelectasis unchanged. 4. Feeding tube tip in the region of the gastric antrum or duodenal bulb. Electronically Signed   By: Franchot Gallo M.D.   On: 08/25/2022 12:57  ECHOCARDIOGRAM COMPLETE  Result Date: 08/25/2022    ECHOCARDIOGRAM REPORT   Patient Name:   Krishawn Vanderweele Date of Exam: 08/25/2022 Medical Rec #:  [id number]         Height:       65.0 in Accession #:    [id number]        Weight:       177.2 lb Date of Birth:  1971/09/08        BSA:          1.879 m Patient Age:    32 years           BP:           136/59 mmHg Patient Gender: M                 HR:           62 bpm. Exam Location:  Inpatient Procedure: 2D Echo, Cardiac Doppler and Color Doppler Indications:    Stroke  History:        Patient has no prior history of Echocardiogram examinations.                 Risk Factors:Diabetes and Hypertension. ESRD.  Sonographer:    Clayton Lefort RDCS (AE) Referring Phys: Rosalin Hawking  Sonographer Comments: Echo performed with patient supine and on artificial respirator and suboptimal subcostal window. IMPRESSIONS  1. Left ventricular ejection fraction, by estimation, is 50 to 55%. The left ventricle has low normal function. The left ventricle has no regional wall motion abnormalities. There is moderate left ventricular hypertrophy. Left ventricular diastolic parameters are consistent with Grade I diastolic dysfunction (impaired relaxation).  2. Right ventricular systolic function is normal. The right ventricular size is normal.  3. The mitral valve is normal in structure. No evidence of mitral valve regurgitation. No evidence of mitral stenosis.  4. The aortic valve is normal in structure. Aortic valve regurgitation is trivial. No aortic stenosis is present.  5. The inferior vena cava is normal in size with greater than 50% respiratory variability, suggesting right atrial pressure of 3 mmHg. Conclusion(s)/Recommendation(s): No intracardiac source of embolism detected on this transthoracic study. Consider a transesophageal echocardiogram to exclude cardiac source of embolism if clinically indicated. FINDINGS  Left Ventricle: Left ventricular ejection fraction, by estimation, is 50 to 55%. The left ventricle has low normal function. The left ventricle has no regional wall motion abnormalities. The left ventricular internal cavity size was normal in size. There is moderate left ventricular hypertrophy. Left ventricular diastolic parameters are consistent with Grade I diastolic dysfunction (impaired  relaxation). Right Ventricle: The right ventricular size is normal. No increase in right ventricular wall thickness. Right ventricular systolic function is normal. Left Atrium: Left atrial size was normal in size. Right Atrium: Right atrial size was normal in size. Pericardium: Trivial pericardial effusion is present. The pericardial effusion is posterior and lateral to the left ventricle. Mitral Valve: The mitral valve is normal in structure. No evidence of mitral valve regurgitation. No evidence of mitral valve stenosis. Tricuspid Valve: The tricuspid valve is normal in structure. Tricuspid valve regurgitation is trivial. No evidence of tricuspid stenosis. Aortic Valve: The aortic valve is normal in structure. Aortic valve regurgitation is trivial. No aortic stenosis is present. Aortic valve mean gradient measures 2.0 mmHg. Aortic valve peak gradient measures 3.7 mmHg. Aortic valve area, by VTI measures 3.76 cm. Pulmonic Valve: The pulmonic valve was normal in structure. Pulmonic valve regurgitation is not visualized. No evidence of  pulmonic stenosis. Aorta: The aortic root is normal in size and structure. Venous: The inferior vena cava is normal in size with greater than 50% respiratory variability, suggesting right atrial pressure of 3 mmHg. IAS/Shunts: No atrial level shunt detected by color flow Doppler.  LEFT VENTRICLE PLAX 2D LV PW:         1.88 cm   Diastology LV IVS:        1.36 cm   LV e' medial:    3.81 cm/s LVOT diam:     2.20 cm   LV E/e' medial:  17.5 LV SV:         69        LV e' lateral:   5.55 cm/s LV SV Index:   37        LV E/e' lateral: 12.0 LVOT Area:     3.80 cm  RIGHT VENTRICLE             IVC RV Basal diam:  2.80 cm     IVC diam: 1.80 cm RV S prime:     15.10 cm/s TAPSE (M-mode): 2.2 cm LEFT ATRIUM             Index        RIGHT ATRIUM           Index LA Vol (A2C):   42.8 ml 22.78 ml/m  RA Area:     10.80 cm LA Vol (A4C):   44.0 ml 23.41 ml/m  RA Volume:   17.70 ml  9.42 ml/m LA  Biplane Vol: 45.9 ml 24.43 ml/m  AORTIC VALVE AV Area (Vmax):    3.67 cm AV Area (Vmean):   3.39 cm AV Area (VTI):     3.76 cm AV Vmax:           95.80 cm/s AV Vmean:          62.200 cm/s AV VTI:            0.183 m AV Peak Grad:      3.7 mmHg AV Mean Grad:      2.0 mmHg LVOT Vmax:         92.40 cm/s LVOT Vmean:        55.400 cm/s LVOT VTI:          0.181 m LVOT/AV VTI ratio: 0.99  AORTA Ao Root diam: 3.50 cm Ao Asc diam:  3.70 cm MITRAL VALVE MV Area (PHT): 2.48 cm    SHUNTS MV Decel Time: 306 msec    Systemic VTI:  0.18 m MV E velocity: 66.50 cm/s  Systemic Diam: 2.20 cm MV A velocity: 75.90 cm/s MV E/A ratio:  0.88 Candee Furbish MD Electronically signed by Candee Furbish MD Signature Date/Time: 08/25/2022/12:17:53 PM    Final    CT ANGIO HEAD NECK W WO CM (CODE STROKE)  Result Date: 08/25/2022 CLINICAL DATA:  Acute neuro deficit with stroke suspected. EXAM: CT ANGIOGRAPHY HEAD AND NECK TECHNIQUE: Multidetector CT imaging of the head and neck was performed using the standard protocol during bolus administration of intravenous contrast. Multiplanar CT image reconstructions and MIPs were obtained to evaluate the vascular anatomy. Carotid stenosis measurements (when applicable) are obtained utilizing NASCET criteria, using the distal internal carotid diameter as the denominator. RADIATION DOSE REDUCTION: This exam was performed according to the departmental dose-optimization program which includes automated exposure control, adjustment of the mA and/or kV according to patient size and/or use of iterative reconstruction technique. CONTRAST:  60m OMNIPAQUE IOHEXOL 350  MG/ML SOLN COMPARISON:  Head CT from yesterday FINDINGS: CT HEAD FINDINGS Brain: Ovoid hematoma in the deep right cerebellum measuring 3.3 cm in length by 2.5 cm in maximal thickness, extending towards and likely into the fourth ventricle (triangular shape on sagittal reformats) but no intraventricular spread. Rim of adjacent edema without  hydrocephalus. No visible infarct or mass. Vascular: See below Skull: Negative Sinuses/Orbits: Right cataract resection. Review of the MIP images confirms the above findings CTA NECK FINDINGS Aortic arch: No acute finding Right carotid system: Atheromatous plaque at the bifurcation without stenosis or ulceration. Left carotid system: Mild atheromatous plaque at the bifurcation. No stenosis or ulceration. Vertebral arteries: No proximal subclavian stenosis. The vertebral arteries are smoothly contoured and diffusely patent. Skeleton: Spinal curvature without acute finding Other neck: Tongue distortion from endotracheal tube. Upper chest: Atelectasis seen in the apical lungs Review of the MIP images confirms the above findings CTA HEAD FINDINGS Anterior circulation: Atheromatous calcification along the carotid siphons without flow reducing stenosis or aneurysm. Posterior circulation: Vertebral and basilar tortuosity with atheromatous calcification. No branch occlusion, beading, aneurysm, or vascular malformation. No spot sign Venous sinuses: Unremarkable for the arterial phase Anatomic variants: None significant Review of the MIP images confirms the above findings IMPRESSION: 1. No vascular lesion or spot sign seen at the right cerebellar hemorrhage. The hemorrhage is size stable from earlier today. There is likely extension into the fourth ventricle but no intraventricular spread or hydrocephalus. 2. Premature atherosclerosis with notable vessel tortuosity suggesting chronic hypertension. Electronically Signed   By: Jorje Guild M.D.   On: 08/25/2022 06:10   CT HEAD CODE STROKE WO CONTRAST  Addendum Date: 08/25/2022   ADDENDUM REPORT: 08/25/2022 02:33 ADDENDUM: Critical Value/emergent results were called by telephone at the time of interpretation on 08/24/2022 at 11:58 pm to provider [facility] , who verbally acknowledged these results. Electronically Signed   By: Ulyses Jarred M.D.   On: 08/25/2022 02:33    Result Date: 08/25/2022 CLINICAL DATA:  Code stroke.  Acute neurologic deficits EXAM: CT HEAD WITHOUT CONTRAST TECHNIQUE: Contiguous axial images were obtained from the base of the skull through the vertex without intravenous contrast. RADIATION DOSE REDUCTION: This exam was performed according to the departmental dose-optimization program which includes automated exposure control, adjustment of the mA and/or kV according to patient size and/or use of iterative reconstruction technique. COMPARISON:  None Available. FINDINGS: Brain: There is an acute intraparenchymal hematoma centered in the right cerebellar hemisphere measuring 2.6 x 1.8 cm with a craniocaudal dimension of 2.2 cm (volume equals 5.1 mL). There is severe mass effect on the fourth ventricle and cerebral aqueduct. No hydrocephalus. There is periventricular hypoattenuation compatible with chronic microvascular disease. Vascular: No abnormal hyperdensity of the major intracranial arteries or dural venous sinuses. No intracranial atherosclerosis. Skull: The visualized skull base, calvarium and extracranial soft tissues are normal. Sinuses/Orbits: No fluid levels or advanced mucosal thickening of the visualized paranasal sinuses. No mastoid or middle ear effusion. The orbits are normal. IMPRESSION: Acute intraparenchymal hematoma centered in the right cerebellar hemisphere with severe mass effect on the fourth ventricle and cerebral aqueduct. No hydrocephalus. Electronically Signed: By: Ulyses Jarred M.D. On: 08/24/2022 23:42   DG Chest Port 1 View  Result Date: 08/25/2022 CLINICAL DATA:  Respiratory failure EXAM: PORTABLE CHEST 1 VIEW COMPARISON:  05/19/2022 FINDINGS: Endotracheal tube is in place 6.2 cm above the carina with its tip at the level of the clavicular heads. Lung volumes are small and there is right basilar  atelectasis. No pneumothorax or pleural effusion. Mild cardiomegaly is present. Trace perihilar interstitial pulmonary edema  noted. No acute bone abnormality. IMPRESSION: 1. Endotracheal tube at the thoracic inlet, 6.2 cm above the carina. 2. Pulmonary hypoinflation. 3. Trace perihilar interstitial pulmonary edema, possibly cardiogenic in nature. 4. Mild cardiomegaly. Electronically Signed   By: Fidela Salisbury M.D.   On: 08/25/2022 00:43   DG Chest 2 View  Result Date: 08/20/2022 CLINICAL DATA:  Shortness of breath EXAM: CHEST - 2 VIEW COMPARISON:  None Available. FINDINGS: Cardiomegaly. Pulmonary vascular congestion. Aortic atherosclerotic calcification. Hazy airspace opacities in the lower lungs. No pleural effusion or pneumothorax. No acute osseous abnormality. IMPRESSION: Cardiomegaly and suspected pulmonary edema, infection considered less likely. Electronically Signed   By: Placido Sou M.D.   On: 08/20/2022 03:44    Labs:  CBC: Recent Labs    09/11/22 0640 09/12/22 0336 09/13/22 0722 09/14/22 0650  WBC 18.8* 14.3* 14.1* 10.9*  HGB 10.0* 9.9* 10.2* 9.5*  HCT 31.3* 29.9* 30.3* 30.1*  PLT 822* 770* 728* 683*    COAGS: Recent Labs    08/24/22 2330  INR 1.0  APTT 31    BMP: Recent Labs    09/11/22 0640 09/12/22 0336 09/13/22 0722 09/14/22 0650  NA 137 135 133* 137  K 3.7 4.1 4.2 3.8  CL 99 96* 93* 99  CO2 25 24 20* 24  GLUCOSE 136* 118* 114* 93  BUN 60* 121* 168* 81*  CALCIUM 9.3 9.2 9.2 8.8*  CREATININE 4.68* 6.70* 9.40* 6.96*  GFRNONAA 14* 9* 6* 9*    LIVER FUNCTION TESTS: Recent Labs    07/21/22 2147 08/24/22 2330 08/25/22 1621 08/28/22 0549 08/31/22 1009 09/05/22 1223 09/11/22 0640 09/12/22 0336 09/13/22 0722 09/14/22 0650  BILITOT 0.5 0.6  --  0.5 0.2*  --   --   --   --   --   AST 17 15  --  15 19  --   --   --   --   --   ALT 26 17  --  14 18  --   --   --   --   --   ALKPHOS 71 67  --  64 84  --   --   --   --   --   PROT 7.6 7.0  --  6.2* 6.3*  --   --   --   --   --   ALBUMIN 3.5 3.4*   < > 2.1* 1.9*   < > 2.9* 2.8* 3.0* 3.0*   < > = values in this interval  not displayed.    TUMOR MARKERS: No results for input(s): "AFPTM", "CEA", "CA199", "CHROMGRNA" in the last 8760 hours.  Assessment and Plan:  Scheduled for tunneled dialysis catheter in IR when can I have called several family members for consent and no answer as of yet   Thank you for this interesting consult.  I greatly enjoyed meeting Adarryl Ruz and look forward to participating in their care.  A copy of this report was sent to the requesting provider on this date.  Electronically Signed: Lavonia Drafts, PA-C 09/14/2022, 10:07 AM   I spent a total of 20 Minutes    in face to face in clinical consultation, greater than 50% of which was counseling/coordinating care for tunneled dialysis catheter placement

## 2022-09-14 NOTE — Progress Notes (Addendum)
Received patient in bed to unit.  Alert confused restless follows no commands Informed consent signed and in chart.   Treatment initiated: [place] Treatment completed: 1740 Patient restless during entire treatment. Ended treatment early d/t elevated arterial alarm pressures, clotted dialyzer/hd set and restlessness Transported back to the room  Alert, without acute distress.  Hand-off given to patient's nurse.   Access used: catheter right neck  Access issues: yes. Elevated arterial alarm pressures sensitive to pt movements  Total UF removed: 1867m Medication(s) given: none Post HD weight:  72.8 kg [facility]  09/14/22 1740  Vitals  Temp 99.2 F (37.3 C)  Temp Source Axillary  BP (!) 187/92  MAP (mmHg) 121  BP Location Right Arm  BP Method Automatic  Patient Position (if appropriate) Lying  Pulse Rate 88  Pulse Rate Source Monitor  ECG Heart Rate 88  Resp 17  Oxygen Therapy  SpO2 100 %  O2 Device Room Air  During Treatment Monitoring  Intra-Hemodialysis Comments Tx completed (Pt clotted with approx. 34 minutes left, Pt would not stay still, Catheter kept alarming, Cath in IJ)  Post Treatment  Dialyzer Clearance Heavily streaked  Duration of HD Treatment -hour(s) 2.75 hour(s)  Hemodialysis Intake (mL) 0 mL  Liters Processed 65.4  Fluid Removed (mL) 1800 mL  Tolerated HD Treatment No (Comment)  Post-Hemodialysis Comments Pt restless with lots of movement, Catheter would not run smoothly, Pt tx ended short d/t clotting off  Hemodialysis Catheter Right Internal jugular Triple lumen Temporary (Non-Tunneled)  Placement Date/Time: 09/06/22 1330   Time Out: Correct patient;Correct site;Correct procedure  Maximum sterile barrier precautions: Hand hygiene;Cap;Mask;Sterile gown;Sterile gloves;Large sterile sheet  Site Prep: Chlorhexidine (preferred)  Local Anes...  Site Condition No complications  Blue Lumen Status Blood return noted;Saline  locked;Heparin locked;Dead end cap in place  Red Lumen Status Blood return noted;Saline locked;Heparin locked;Dead end cap in place  Purple Lumen Status N/A  Catheter fill solution Heparin 1000 units/ml  Catheter fill volume (Arterial) 1.2 cc  Catheter fill volume (Venous) 1.2  Dressing Type Transparent  Dressing Status Antimicrobial disc in place;Clean, Dry, Intact  Drainage Description None  Dressing Change Due 09/18/22  Post treatment catheter status Capped and Clamped

## 2022-09-14 NOTE — Progress Notes (Signed)
[facility] Progress Note   Subjective:   seen in dialysis just prior to treatment.  PT awake but not responding to questions.  Requiring wrist restraints.  Family consented for [facility].   Objective Vitals:   09/14/22 6270 09/14/22 0755 09/14/22 1210 09/14/22 1230  BP: (!) 217/75 (!) 156/82 (!) 164/98 (!) 148/90  Pulse:  91 82   Resp:  18 17   Temp:  98.7 F (37.1 C) 98 F (36.7 C)   TempSrc:  Oral Oral   SpO2:  98% 98%   Weight:      Height:       Physical Exam General: extubated, NG tube in  Heart: RRR, no murmurs, rubs or gallops Lungs: CTA anteriorly Abdomen: Soft, non-distended, +BS Extremities: no edema b/l lower extremties Neuro: not following commands today Dialysis Access:  LUE AVF + bruit, R IJ temp HD catheter c/d/i    Dialysis Orders: SW MWF 3h 91mn  80kg   400/800   2/2 bath  Hep 2000  LFA AVF - last HD 11/13, post 80.1kg - mircera 150 q2, last 11/13 - hectorol 449m IV q HD  Assessment/Plan: 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 S/P course of hypertonic fluids. Using high Na+ 145 w/ HD for now per neuro request to keep Na+ in normal range (135-145). Na+ 137 today.   HTN/ vol  - getting amlodipine/ coreg/amlodipine per tube, also has PRNs ordered. No edema on exam, BP's variable. Tolerated 3 L UF last 2 sessions. Now is 2kg under. Cont to lower vol w/ HD this week a bit more.  Hyperkalemia - recurrent issue--> TF's were changed to Nepro and it's improved.  AMS - due to CVA/ bleed and now with delerium and ongoing MS changes.  ESRD - pt required CRRT 11/15-11/17/23. Getting HD MWF now. Next HD today.  HD access - BUN was very high so access recirc was suspected. Using temp HD cath placed 11/27 by CCM and BUN better. IR consulted for fistulogram - done 12/4 and noted to be small with competing veins.  Use HD for now - will need to change to tunneled line  - IR plans tomorrow Anemia of ESRD: Hgb 9s, continue  Aranesp 15059mweekly while here. Secondary HPTH: CCa ok. renvela per NG tube now at goal. Continue VDRA.  DM2 - per pmd  GOC - full code - unclear where he'll go from here - if this is mainly delerium or his baseline status now.  Requiring restraints and not interacting.  Cortrak inserted for nutrition after failed swallow study and pulled out NG tube.   LinJannifer Hick [facility] Pager [phone] Labs  Lab 09/13/22 [id number]/02 0650  HGB 10.2* 9.5*  ALBUMIN 3.0* 3.0*  CALCIUM 9.2 8.8*  PHOS 5.1* 5.0*  CREATININE 9.40* 6.96*  K 4.2 3.8     Inpatient medications:  amLODipine  5 mg Per Tube BID   carvedilol  25 mg Per Tube BID WC   Chlorhexidine Gluconate Cloth  6 each Topical Q0600   Chlorhexidine Gluconate Cloth  6 each Topical Q0600   darbepoetin (ARANESP) injection - DIALYSIS  150 mcg Subcutaneous Q Mon-1800   feeding supplement (PROSource TF20)  60 mL Per Tube BID   heparin injection (subcutaneous)  5,000 Units Subcutaneous Q8H   hydrALAZINE  100 mg Per Tube Q8H   insulin aspart  0-15 Units Subcutaneous Q6H   insulin glargine-yfgn  10 Units Subcutaneous Daily  multivitamin  1 tablet Per Tube QHS   nutrition supplement (JUVEN)  1 packet Per Tube BID BM   mouth rinse  15 mL Mouth Rinse 4 times per day   pantoprazole (PROTONIX) IV  40 mg Intravenous Q12H   QUEtiapine  25 mg Oral Once   QUEtiapine  50 mg Per NG tube BID   sevelamer carbonate  2.4 g Per Tube TID WC    sodium chloride Stopped (08/30/22 0033)    ceFAZolin (ANCEF) IV     feeding supplement (NEPRO CARB STEADY) 50 mL/hr at 09/12/22 1400   sodium chloride, acetaminophen (TYLENOL) oral liquid 160 mg/5 mL, bisacodyl, fentaNYL (SUBLIMAZE) injection, hydrALAZINE, labetalol, ondansetron (ZOFRAN) IV, mouth rinse

## 2022-09-14 NOTE — Progress Notes (Addendum)
Nutrition Follow-up  DOCUMENTATION CODES:  Not applicable  INTERVENTION:  When enteral access available, recommend restarting the following: Nepro @ 50 ml/h (1200 ml per day) Prosource TF 1x/d Provides 2204 kcal, 117 gm protein, 876 ml free water daily  NUTRITION DIAGNOSIS:  Inadequate oral intake related to inability to eat as evidenced by NPO status. Remains applicable  GOAL:  Patient will meet greater than or equal to 90% of their needs Not progressing, no access  MONITOR:  Vent status, Labs, Weight trends, TF tolerance, I & O's  REASON FOR ASSESSMENT:  Ventilator, Consult Enteral/tube feeding initiation and management  ASSESSMENT:  51 year old male who presented to the ED on 11/14 as a Code Stroke with AMS, L sided weakness, slurred speech, L sided gaze preference. PMH of ESRD on HD, DM, HTN. Pt required intubation in the ED. Pt admitted with [place].  11/14 - intubated 11/15 - s/p cortrak placement; tip at gastric antrum or duodenal bulb  11/15 - 11/17: CRRT 11/19 - started iHD 12/3 - pt removed cortrak tube  Pt resting in bed in restraints, no family present. Cortrak tube unable to be placed yesterday as pt was out of room for several attempts. Still on list to have placed tomorrow.   Nutritionally Relevant Medications: Scheduled Meds:  (PROSource TF20)  60 mL Per Tube BID   insulin aspart  0-15 Units Subcutaneous Q6H   insulin glargine-yfgn  10 Units Subcutaneous Daily   multivitamin  1 tablet Per Tube QHS   (JUVEN)  1 packet Per Tube BID BM   PROTONIX  40 mg Intravenous Q12H   sevelamer carbonate  2.4 g Per Tube TID WC   Continuous Infusions:   ceFAZolin (ANCEF) IV     feeding supplement (NEPRO CARB STEADY) 50 mL/hr at 09/12/22 1400   PRN Meds: bisacodyl, ondansetron  Labs Reviewed: BUN 81, creatinine 6.96 Phosphorus 5.0  NUTRITION - FOCUSED PHYSICAL EXAM: Flowsheet Row Most Recent Value  Orbital Region No depletion  Upper Arm Region Mild depletion   Thoracic and Lumbar Region No depletion  Buccal Region Unable to assess  Temple Region No depletion  Clavicle Bone Region Mild depletion  Clavicle and Acromion Bone Region Mild depletion  Scapular Bone Region No depletion  Dorsal Hand No depletion  Patellar Region No depletion  Anterior Thigh Region No depletion  Posterior Calf Region No depletion  Edema (RD Assessment) None  Hair Reviewed  Eyes Reviewed  Mouth Reviewed  Skin Reviewed  Nails Reviewed    Diet Order:   Diet Order             Diet NPO time specified  Diet effective midnight           Diet NPO time specified  Diet effective now                   EDUCATION NEEDS:  No education needs have been identified at this time  Skin:  Skin Assessment: Skin Integrity Issues: Skin Integrity Issues:: Stage II Stage II: sacrum  Last BM:  12/3 type 5  Height:  Ht Readings from Last 1 Encounters:  08/25/22 '5\' 5"'$  (1.651 m)    Weight:  Wt Readings from Last 1 Encounters:  09/13/22 76.1 kg    Ideal Body Weight:  61.8 kg  BMI:  Body mass index is 27.92 kg/m.  Estimated Nutritional Needs:  Kcal:  2000-2200 kcal/d Protein:  100-125g/d Fluid:  1000 ml + UOP    Ranell Patrick, RD, LDN Clinical Dietitian  RD pager # available in AMION  After hours/weekend pager # available in [facility]

## 2022-09-14 NOTE — Progress Notes (Cosign Needed Addendum)
   Consent now obtained for tunneled dialysis catheter  Risks and benefits discussed with the patient's sister [name] via phone including, but not limited to bleeding, infection, vascular injury, pneumothorax which may require chest tube placement, air embolism or even death  All questions were answered, Randy Ross is agreeable to proceed. Consent signed and in chart.

## 2022-09-14 NOTE — Progress Notes (Signed)
TRH night cross cover note:   I was notified by RN that patient continues to be agitated, pulling at  lines, with these behaviors refractory to attempts at verbal redirection.  In the setting of associated interference with ongoing medical treatment posing potential harm to themself, I have renewed existing orders for nonviolent soft bilateral wrist restraints.       Babs Bertin, DO Hospitalist

## 2022-09-14 NOTE — Progress Notes (Signed)
PT Cancellation Note  Patient Details Name: Randy Ross MRN: [id number] DOB: 07/25/1971   Cancelled Treatment:    Reason Eval/Treat Not Completed: Patient at procedure or test/unavailable  Patient off the unit for procedure.    [place]  Office [phone]  [name] 09/14/2022, 2:42 PM

## 2022-09-15 ENCOUNTER — Inpatient Hospital Stay (HOSPITAL_COMMUNITY): Payer: No Typology Code available for payment source

## 2022-09-15 HISTORY — PX: IR US GUIDE VASC ACCESS RIGHT: IMG2390

## 2022-09-15 HISTORY — PX: IR FLUORO GUIDE CV LINE RIGHT: IMG2283

## 2022-09-15 LAB — CBC
HCT: 32.8 % — ABNORMAL LOW (ref 39.0–52.0)
Hemoglobin: 10.2 g/dL — ABNORMAL LOW (ref 13.0–17.0)
MCH: 22 pg — ABNORMAL LOW (ref 26.0–34.0)
MCHC: 31.1 g/dL (ref 30.0–36.0)
MCV: 70.7 fL — ABNORMAL LOW (ref 80.0–100.0)
Platelets: 614 10*3/uL — ABNORMAL HIGH (ref 150–400)
RBC: 4.64 MIL/uL (ref 4.22–5.81)
RDW: 21.8 % — ABNORMAL HIGH (ref 11.5–15.5)
WBC: 10.1 10*3/uL (ref 4.0–10.5)
nRBC: 0 % (ref 0.0–0.2)

## 2022-09-15 LAB — GLUCOSE, CAPILLARY
Glucose-Capillary: 100 mg/dL — ABNORMAL HIGH (ref 70–99)
Glucose-Capillary: 130 mg/dL — ABNORMAL HIGH (ref 70–99)
Glucose-Capillary: 189 mg/dL — ABNORMAL HIGH (ref 70–99)
Glucose-Capillary: 183 mg/dL — ABNORMAL HIGH (ref 70–99)

## 2022-09-15 LAB — MAGNESIUM: Magnesium: 2 mg/dL (ref 1.7–2.4)

## 2022-09-15 LAB — COMPREHENSIVE METABOLIC PANEL
ALT: 43 U/L (ref 0–44)
AST: 30 U/L (ref 15–41)
Albumin: 3.2 g/dL — ABNORMAL LOW (ref 3.5–5.0)
Alkaline Phosphatase: 91 U/L (ref 38–126)
Anion gap: 13 (ref 5–15)
BUN: 59 mg/dL — ABNORMAL HIGH (ref 6–20)
CO2: 26 mmol/L (ref 22–32)
Calcium: 8.9 mg/dL (ref 8.9–10.3)
Chloride: 98 mmol/L (ref 98–111)
Creatinine, Ser: 6.68 mg/dL — ABNORMAL HIGH (ref 0.61–1.24)
GFR, Estimated: 9 mL/min — ABNORMAL LOW (ref >60)
Glucose, Bld: 100 mg/dL — ABNORMAL HIGH (ref 70–99)
Potassium: 3.5 mmol/L (ref 3.5–5.1)
Sodium: 137 mmol/L (ref 135–145)
Total Bilirubin: 1 mg/dL (ref 0.3–1.2)
Total Protein: 7.9 g/dL (ref 6.5–8.1)

## 2022-09-15 MED ORDER — MIDAZOLAM HCL 2 MG/2ML IJ SOLN
INTRAMUSCULAR | Status: AC
  Filled 2022-09-15: qty 2

## 2022-09-15 MED ORDER — GELATIN ABSORBABLE 12-7 MM EX MISC
CUTANEOUS | Status: AC
  Filled 2022-09-15: qty 1

## 2022-09-15 MED ORDER — FENTANYL CITRATE (PF) 100 MCG/2ML IJ SOLN
INTRAMUSCULAR | Status: AC
  Filled 2022-09-15: qty 2

## 2022-09-15 MED ORDER — CEFAZOLIN SODIUM-DEXTROSE 2-4 GM/100ML-% IV SOLN
INTRAVENOUS | Status: AC
  Administered 2022-09-15: 2 g via INTRAVENOUS
  Filled 2022-09-15: qty 100

## 2022-09-15 MED ORDER — SODIUM CHLORIDE 0.9 % IV SOLN
0.3000 ug/kg | INTRAVENOUS | Status: AC
  Administered 2022-09-15: 22 ug via INTRAVENOUS
  Filled 2022-09-15: qty 5.5

## 2022-09-15 MED ORDER — FENTANYL CITRATE (PF) 100 MCG/2ML IJ SOLN
INTRAMUSCULAR | Status: AC | PRN
  Administered 2022-09-15: 25 ug via INTRAVENOUS

## 2022-09-15 MED ORDER — LIDOCAINE-EPINEPHRINE 1 %-1:100000 IJ SOLN
INTRAMUSCULAR | Status: AC
  Administered 2022-09-15: 7 mL
  Filled 2022-09-15: qty 1

## 2022-09-15 MED ORDER — SODIUM CHLORIDE 0.9 % IV SOLN: 0.3000 ug/kg | Freq: Once | INTRAVENOUS | Status: DC

## 2022-09-15 MED ORDER — PROSOURCE TF20 ENFIT COMPATIBL EN LIQD
60.0000 mL | Freq: Every day | ENTERAL | Status: DC
  Administered 2022-09-16 – 2022-09-23 (×8): 60 mL
  Filled 2022-09-15 (×8): qty 60

## 2022-09-15 MED ORDER — LIDOCAINE-EPINEPHRINE 1 %-1:100000 IJ SOLN: 20.0000 mL | Freq: Once | INTRAMUSCULAR | Status: AC

## 2022-09-15 MED ORDER — MIDAZOLAM HCL 2 MG/2ML IJ SOLN
INTRAMUSCULAR | Status: AC | PRN
  Administered 2022-09-15: .5 mg via INTRAVENOUS

## 2022-09-15 MED ORDER — HEPARIN SODIUM (PORCINE) 1000 UNIT/ML IJ SOLN
INTRAMUSCULAR | Status: AC
  Administered 2022-09-15: 3.2 mL
  Filled 2022-09-15: qty 10

## 2022-09-15 NOTE — Progress Notes (Signed)
TRH night cross cover note:   This patient remains agitated, pulling at lines, with these behaviors refractory to attempts at verbal redirection.  In the setting of associated interference with ongoing medical treatment posing potential harm to themself, I have renewed existing orders for nonviolent soft bilateral wrist restraints.       Babs Bertin, DO Hospitalist

## 2022-09-15 NOTE — Procedures (Signed)
Cortrak  Person Inserting Tube:  Maylon Peppers C, RD Tube Type:  Cortrak - 43 inches Tube Size:  10 Tube Location:  Left nare Secured by: Bridle Technique Used to Measure Tube Placement:  Marking at nare/corner of mouth Cortrak Secured At:  68 cm   Cortrak Tube Team Note:  Consult received to place a Cortrak feeding tube.   X-ray is required, abdominal x-ray has been ordered by the Cortrak team. Please confirm tube placement before using the Cortrak tube.   If the tube becomes dislodged please keep the tube and contact the Cortrak team at [email or web address] for replacement.  If after hours and replacement cannot be delayed, place a NG tube and confirm placement with an abdominal x-ray.    Lockie Pares., RD, LDN, CNSC See AMiON for contact information

## 2022-09-15 NOTE — Evaluation (Signed)
Physical Therapy Evaluation Patient Details Name: Randy Ross MRN: [id number] DOB: Nov 19, 1970 Today's Date: 09/15/2022  History of Present Illness  51 y.o. male presented 08/24/22 with slurred speech and headache. Head CT Rt cerebellar hemorrhage; intubated 11/14, self-extubated 11/30; CRRT; MRI brain 11/21 many small acute infarcts; PMH significant for end-stage renal disease on intermittent hemodialysis, diabetes, hypertension  Clinical Impression   Pt admitted secondary to problem above with deficits below. PTA patient was assumed to be independent (pt non-verbal and no family present). Pt currently requires +2 total assist for any mobility attempts due to restlessness and decr cognition. Patient not following commands or responding to stimuli (except blinks to threat from left side). Will initiate trial of PT with 1x/week frequency to monitor if patient able to participate and benefit from skilled interventions.           Recommendations for follow up therapy are one component of a multi-disciplinary discharge planning process, led by the attending physician.  Recommendations may be updated based on patient status, additional functional criteria and insurance authorization.  Follow Up Recommendations Long-term institutional care without follow-up therapy Can patient physically be transported by private vehicle: No    Assistance Recommended at Discharge Frequent or constant Supervision/Assistance  Patient can return home with the following  Two people to help with walking and/or transfers;Two people to help with bathing/dressing/bathroom;Assistance with cooking/housework;Assistance with feeding;Direct supervision/assist for medications management;Direct supervision/assist for financial management;Assist for transportation;Help with stairs or ramp for entrance    Equipment Recommendations None recommended by PT  Recommendations for Other Services       Functional Status Assessment  Patient has had a recent decline in their functional status and/or demonstrates limited ability to make significant improvements in function in a reasonable and predictable amount of time     Precautions / Restrictions Precautions Precautions: Fall;Other (comment) Precaution Comments: pulled out multiple cortraks      Mobility  Bed Mobility               General bed mobility comments: pt too restless to attempt EOB with 1 person assist; in bil wrist and mitts restraints having pulled multiple cortraks    Transfers                   General transfer comment: unsafe to attempt    Ambulation/Gait                  Stairs            Wheelchair Mobility    Modified Rankin (Stroke Patients Only)       Balance Overall balance assessment: Needs assistance     Sitting balance - Comments: can pull his trunk forward to long-sitting and maintain trunk in midline (spontaneously)                                     Pertinent Vitals/Pain Pain Assessment Pain Assessment: Faces Faces Pain Scale: No hurt    Home Living Family/patient expects to be discharged to:: Unsure                        Prior Function Prior Level of Function : Independent/Modified Independent             Mobility Comments: assumed independent; pt unable to state; no family present       Hand Dominance  Extremity/Trunk Assessment   Upper Extremity Assessment Upper Extremity Assessment: Difficult to assess due to impaired cognition (AAROM WFL; unable to assess strength due to cognition; moves against gravity)    Lower Extremity Assessment Lower Extremity Assessment: Difficult to assess due to impaired cognition (moving legs spontaneously, not on command)    Cervical / Trunk Assessment Cervical / Trunk Assessment: Other exceptions Cervical / Trunk Exceptions: tends to keep head rotated to his right, but able to passively rotate 45  degrees to his left  Communication   Communication: Other (comment) (no attempts to verbalize)  Cognition Arousal/Alertness: Awake/alert Behavior During Therapy: Restless Overall Cognitive Status: Difficult to assess                                 General Comments: not verbal; not following commands; looks around room but not tracking; does not blink to threat on rt but does on left; does not turn head to name or loud sounds        General Comments General comments (skin integrity, edema, etc.): Noted bleeding from new RIJ site and RN aware and in to re-dress    Exercises     Assessment/Plan    PT Assessment Patient needs continued PT services  PT Problem List Decreased balance;Decreased mobility;Decreased cognition;Decreased knowledge of use of DME;Decreased safety awareness       PT Treatment Interventions DME instruction;Gait training;Functional mobility training;Therapeutic activities;Balance training;Neuromuscular re-education;Cognitive remediation;Patient/family education    PT Goals (Current goals can be found in the Care Plan section)  Acute Rehab PT Goals Patient Stated Goal: unable PT Goal Formulation: Patient unable to participate in goal setting Time For Goal Achievement: 09/29/22 Potential to Achieve Goals: Poor    Frequency Min 1X/week (trial)     Co-evaluation               AM-PAC PT "6 Clicks" Mobility  Outcome Measure Help needed turning from your back to your side while in a flat bed without using bedrails?: Total Help needed moving from lying on your back to sitting on the side of a flat bed without using bedrails?: Total Help needed moving to and from a bed to a chair (including a wheelchair)?: Total Help needed standing up from a chair using your arms (e.g., wheelchair or bedside chair)?: Total Help needed to walk in hospital room?: Total Help needed climbing 3-5 steps with a railing? : Total 6 Click Score: 6    End of  Session   Activity Tolerance: Patient tolerated treatment well Patient left: in bed;with bed alarm set;with nursing/sitter in room;with restraints reapplied (bil wrist restraints and mitts) Nurse Communication: Other (comment) (not following commands) PT Visit Diagnosis: Other abnormalities of gait and mobility (R26.89);Other symptoms and signs involving the nervous system (R29.898)    Time: 1140-1153 PT Time Calculation (min) (ACUTE ONLY): 13 min   Charges:   PT Evaluation $PT Eval Low Complexity: [place], PT Acute Rehabilitation Services  Office [phone]   [name] 09/15/2022, 12:07 PM

## 2022-09-15 NOTE — TOC Initial Note (Signed)
Transition of Care [facility]) - Initial/Assessment Note    Patient Details  Name: Randy Ross MRN: [id number] Date of Birth: 1971-05-09  [facility] CM/SW Contact:    Jinger Neighbors, LCSW Phone Number: 09/15/2022, 3:19 PM  Clinical Narrative:                 CSW consulted with Pt's sister, Danise Mina to complete initial assessment. CSW discussed PT recommendations of long term institutional care w/o PT follow up. Palliative currently following and will meet with family this weekend. Danise Mina reports they want to bring pt home. CSW will f/u as needed.   Expected Discharge Plan: Home w Hospice Care Barriers to Discharge: Continued Medical Work up   Patient Goals and CMS Choice   CMS Medicare.gov Compare Post Acute Care list provided to:: Patient Represenative (must comment) ([place],[name]: pt's sister) Choice offered to / list presented to : Sibling  Expected Discharge Plan and Services Expected Discharge Plan: [place] arrangements for the past 2 months: Single Family Home                                      Prior Living Arrangements/Services Living arrangements for the past 2 months: Single Family Home   Patient language and need for interpreter reviewed:: Yes        Need for Family Participation in Patient Care: Yes (Comment) Care giver support system in place?: Yes (comment) Current home services: Home OT Criminal Activity/Legal Involvement Pertinent to Current Situation/Hospitalization: No - Comment as needed  Activities of Daily Living   ADL Screening (condition at time of admission) Patient's cognitive ability adequate to safely complete daily activities?: Yes  Permission Sought/Granted      Share Information with NAME: Casique,[name]     Permission granted to share info w Relationship: Deroy,[name]; sister  Permission granted to share info w Contact Information: [place],[name]  Emotional Assessment               Admission diagnosis:  ICH (intracerebral hemorrhage) ([place]) [I61.9] Patient Active Problem List   Diagnosis Date Noted   Cerebral edema ([place]) 14/48/1856   Acute embolic stroke ([place]) 31/49/7026   Lower GI bleeding 09/12/2022   Dysphagia 09/12/2022   Gastrointestinal hemorrhage 37/85/8850   Acute metabolic encephalopathy and delirium 09/10/2022   Pressure injury of skin 09/01/2022   Acute respiratory failure with hypoxia ([place]) 08/25/2022   Hypertensive emergency 08/25/2022   Encounter for continuous renal replacement therapy (CRRT) for acute renal failure ([place]) 08/25/2022   Acute cerebellar hemorrhage ([place]) 08/25/2022   ESRD (end stage renal disease) ([place]) 12/15/2021   Essential hypertension 12/15/2021   Diabetes mellitus ([place]) 12/15/2021   CKD (chronic kidney disease), stage IV ([place]) 12/08/2021   AKI (acute kidney injury) ([place]) 12/08/2021   MGUS (monoclonal gammopathy of unknown significance) 08/06/2019   Anemia 08/06/2019   PCP:  Medicine, [place]:   [facility] [place], [place] AT [address] [place] [address] Phone: [phone] Fax: [phone]     Social Determinants of Health (SDOH) Interventions    Readmission Risk Interventions     No data to display

## 2022-09-15 NOTE — Progress Notes (Signed)
Interventional Radiology Brief Note:  Patient s/p right IJ tunneled dialysis catheter placement this AM.  IR notified of bleeding post placement.  Patient given DDAVP this afternoon without immediate effect.  Brought patient to department for assessment of catheter, however by the time he arrived a large clot had formed around the insertion site and there was no active bleeding.  Site observed for several minutes without change. Redressed and returned to unit.  No additional interventions were required.    Brynda Greathouse, MS RD PA-C

## 2022-09-15 NOTE — Procedures (Signed)
Interventional Radiology Procedure Note ? ?Procedure: RT IJ HD CATH   ? ?Complications: None ? ?Estimated Blood Loss:  MIN ? ?Findings: ?TIP SVCRA   ? ?M. TREVOR Jerline Linzy, MD ? ? ? ?

## 2022-09-15 NOTE — Plan of Care (Signed)
  Problem: Safety: Goal: Non-violent Restraint(s) Outcome: Progressing   Problem: Activity: Goal: Risk for activity intolerance will decrease Outcome: Progressing   Problem: Coping: Goal: Level of anxiety will decrease Outcome: Progressing

## 2022-09-15 NOTE — Progress Notes (Signed)
SLP Cancellation Note  Patient Details Name: Randy Ross MRN: [id number] DOB: 06/14/1971   Cancelled treatment:       Reason Eval/Treat Not Completed: Patient at procedure or test/unavailable (Pt off unit at this time. SLP will follow up later as schedule allows)  [name] I. Hardin Negus, [place], [place] Office number [phone]  [name] 09/15/2022, 9:33 AM

## 2022-09-15 NOTE — Progress Notes (Signed)
PROGRESS NOTE    Randy Ross  VHQ:[id number] DOB: 1971/08/10 DOA: 08/24/2022 PCP: Medicine, [facility]    Brief Narrative:   Ervey Fallin is a 51 y.o. male with past medical history significant for ESRD on HD, DM2, essential hypertension who presented to [facility] ED with sudden onset slurred speech, headache.  Workup in the ER with elevated BP of 260/140 and CT head showing cerebellar hemorrhage.  Patient was initially intubated in the ED for airway protection and admitted to the PCCM/neurology service.  Significant Hospital events: 11/14: Admitted for acute cerebellar hemorrhage; intubated in the ER, started on hypertonic saline 11/15: Neurosurgery were consulted, recommended against surgical evacuation 11/16: Cefepime started for suspected aspiration 11/17: Required 1u PRBC transfusion due to CRRT filter 11/19: EEG nonspecific 11/21: MRI brain shows new scattered bilateral acute infarcts 11/22: Completed 7 days antibiotics 11/24: IR consulted given recirculation syndrome, high BUN; plan for fistulogram pending 11/27: Temp cath HD cath placed  11/30: Self-extubated 12/1: Persistent delirium 12/2: Transferred out of ICU to [facility] service 12/3: Pulled out cortrak 12/4: Failed SLP, needs cortrak, fistulogram shows poor outflow 12/5: TDC placed, feeding tube replaced 12/6: HD catheter placed by IR; palliative care scheduled family meeting for 12/9   Assessment & Plan:   Acute Cerebellar hemorrhage 2/2 poorly controlled hypertension Cerebral edema Patient presenting to ED via EMS with mental status change, left-sided weakness, slurred speech and left-sided gaze preference.  Patient was noted to be severely hypertensive on admission.  Patient was subsequently intubated for airway protection and CT notable for IPH right cerebellar hemisphere with mass effect.  Patient was started on Cleviprex, Versed and admitted under the critical care/neurology service.   Patient was seen by neurosurgery who did not recommend surgical intervention; and patient was started on hypertonic saline and placed on CRRT to minimize rapid fluid shifts. -- Continues with poor progression in terms of mental status, repeat CT head x 3 remained stable overall very poor prognosis and palliative care following.  Neurology now signed off.  And CRRT converted back to HD per nephrology.  Family meeting planned for 12/9; aunt Spaete need for transition to comfort measures.  Dysphagia Etiology low please secondary to persistent encephalopathy, patient has pulled out core track [name], replaced today.  Palliative care following, plan for family meeting on 12/9 and if no progression towards oral intake and family still wanting aggressive measures will need PEG tube placed. --Continue tube feeds via core track until further family discussion -- Aspiration precautions  Lower GI bleeding: Resolved Noted by nurse, transient, no change in hemoglobin.  No further bleeding reported. 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  TTE with LVEF 50-55%.  LDL 91, hemoglobin A1c 5.9.  Holding antiplatelet for anemia, cerebellar hemorrhage.  Neurology now signed off; and do not recommend further evaluation for cardiac source of embolism as he is not a good long-term anticoagulation candidate.  Hypertensive emergency Hx essential hypertension BP severely elevated on admission, likely leading to acute cerebellar hemorrhage. --Amlodipine 5 mg per tube twice daily -- Carvedilol 25 mg per tube twice daily -- Hydralazine 100 mg per tube every 8 hours  Acute respiratory failure with hypoxia: Resolved Aspiration pneumonia Patient was initially intubated for airway protection upon ED arrival, patient self-extubated on 11/30.  Completed course of  antibiotics with cefepime followed by ceftriaxone.  Acute metabolic encephalopathy Patient continues  to not be able to follow commands, likely secondary to brain insult. --Supportive care, Seroquel   Type 2 diabetes mellitus Hemoglobin A1c 5.9 08/25/2022, well-controlled. -- Semglee 10 units [place] daily -- Moderate SSI for coverage --CBGs qAC/HS  ESRD on HD -- Continue HD per nephrology  Anemia of chronic medical/renal disease Hemoglobin stable, continue intermittent monitoring.  Transfuse hemoglobin send 7.0  Clotted fistula Fistulogram showed poor outflow from fistula, nephrology consulted IR for HD catheter placement which was performed on 12/6.   DVT prophylaxis: heparin injection 5,000 Units Start: 09/12/22 1445 Place and maintain sequential compression device Start: 08/25/22 1025    Code Status: Full Code Family Communication: No family present at bedside this morning  Disposition Plan:  Level of care: Telemetry Medical Status is: Inpatient Remains inpatient appropriate because: Pending placement, family meeting planned for 12/9, may need to consider comfort measures    Consultants:  PCCM: Signed off 12/2 Neurology: Signed off 12/4 Neurosurgery Nephrology Interventional radiology Palliative care  Procedures:  HD catheter placement, IR 12/6  Antimicrobials:  Cefepime 11/17 - 11/18 Ceftriaxone 11/19 - 11/22 Cefazolin 12/6 - 12/6   Subjective: Patient seen examined at bedside, resting comfortably.  Lying in bed.  Just returning from IR for HD catheter placement.  Nonverbal.  No acute events overnight per nursing staff.  Objective: Vitals:   09/15/22 1030 09/15/22 1047 09/15/22 1100 09/15/22 1522  BP: (!) 145/94 (!) 145/92 (!) 158/81 (!) 162/83  Pulse: 96 91 83 95  Resp: '20 18 20 20  '$ Temp: 98.8 F (37.1 C) 98.9 F (37.2 C) 98.8 F (37.1 C) 98.9 F (37.2 C)  TempSrc: Oral  Oral Oral  SpO2:   100% 100%  Weight:      Height:        Intake/Output  Summary (Last 24 hours) at 09/15/2022 1601 Last data filed at 09/14/2022 1740 Gross per 24 hour  Intake --  Output 1800 ml  Net -1800 ml   Filed Weights   09/13/22 1910 09/14/22 1415 09/14/22 1754  Weight: 76.1 kg 74.6 kg 72.8 kg    Examination:  Physical Exam: GEN: NAD, lying in bed, nonverbal makes eye contact PULM: CTAB w/o wheezes/crackles, normal respiratory effort on room air CV: RRR w/o M/G/R GI: abd soft, NTND, NABS MSK: no peripheral edema    Data Reviewed: I have personally reviewed following labs and imaging studies  CBC: Recent Labs  Lab 09/11/22 0640 09/12/22 0336 09/13/22 0722 09/14/22 0650 09/15/22 0530  WBC 18.8* 14.3* 14.1* 10.9* 10.1  HGB 10.0* 9.9* 10.2* 9.5* 10.2*  HCT 31.3* 29.9* 30.3* 30.1* 32.8*  MCV 70.8* 69.1* 68.1* 70.3* 70.7*  PLT 822* 770* 728* 683* 784*   Basic Metabolic Panel: Recent Labs  Lab 09/10/22 0421 09/11/22 0640 09/12/22 0336 09/13/22 0722 09/14/22 0650 09/15/22 0530  NA 133* 137 135 133* 137 137  K 3.8 3.7 4.1 4.2 3.8 3.5  CL 91* 99 96* 93* 99 98  CO2 '27 25 24 '$ 20* 24 26  GLUCOSE 183* 136* 118* 114* 93 100*  BUN 97* 60* 121* 168* 81* 59*  CREATININE 6.33* 4.68* 6.70* 9.40* 6.96* 6.68*  CALCIUM 9.6 9.3 9.2 9.2 8.8* 8.9  MG 2.1 2.0 2.4 2.4 2.3 2.0  PHOS 5.2* 3.4 4.0 5.1* 5.0*  --    GFR: Estimated Creatinine Clearance: 11.4 mL/min (A) (by C-G formula based on SCr of 6.68 mg/dL (H)). Liver Function Tests: Recent Labs  Lab 09/11/22 0640 09/12/22 0336 09/13/22 0722 09/14/22 0650 09/15/22 0530  AST  --   --   --   --  30  ALT  --   --   --   --  43  ALKPHOS  --   --   --   --  91  BILITOT  --   --   --   --  1.0  PROT  --   --   --   --  7.9  ALBUMIN 2.9* 2.8* 3.0* 3.0* 3.2*   No results for input(s): "LIPASE", "AMYLASE" in the last 168 hours. No results for input(s): "AMMONIA" in the last 168 hours. Coagulation Profile: No results for input(s): "INR", "PROTIME" in the last 168 hours. Cardiac Enzymes: No  results for input(s): "CKTOTAL", "CKMB", "CKMBINDEX", "TROPONINI" in the last 168 hours. BNP (last 3 results) No results for input(s): "PROBNP" in the last 8760 hours. HbA1C: No results for input(s): "HGBA1C" in the last 72 hours. CBG: Recent Labs  Lab 09/14/22 0616 09/14/22 1210 09/14/22 2342 09/15/22 0605 09/15/22 1212  GLUCAP 100* 113* 115* 100* 130*   Lipid Profile: Recent Labs    09/14/22 0650  TRIG 88   Thyroid Function Tests: No results for input(s): "TSH", "T4TOTAL", "FREET4", "T3FREE", "THYROIDAB" in the last 72 hours. Anemia Panel: No results for input(s): "VITAMINB12", "FOLATE", "FERRITIN", "TIBC", "IRON", "RETICCTPCT" in the last 72 hours. Sepsis Labs: No results for input(s): "PROCALCITON", "LATICACIDVEN" in the last 168 hours.  No results found for this or any previous visit (from the past 240 hour(s)).       Radiology Studies: DG Abd Portable 1V  Result Date: 09/15/2022 CLINICAL DATA:  Feeding tube placement EXAM: PORTABLE ABDOMEN - 1 VIEW COMPARISON:  09/14/2022 FINDINGS: Non weighted enteric feeding tube is positioned with tip below the diaphragm in the vicinity of the pylorus or duodenal bulb. Nonobstructive pattern of included bowel gas. No free air. IMPRESSION: Non weighted enteric feeding tube is positioned with tip below the diaphragm in the vicinity of the pylorus or duodenal bulb. Electronically Signed   By: Delanna Ahmadi M.D.   On: 09/15/2022 12:10   IR Fluoro Guide CV Line Right  Result Date: 09/15/2022 INDICATION: Access for long-term dialysis EXAM: ULTRASOUND GUIDANCE FOR VASCULAR ACCESS RIGHT INTERNAL JUGULAR PERMANENT HEMODIALYSIS CATHETER Date:  09/15/2022 09/15/2022 9:58 am Radiologist:  Jerilynn Mages. Daryll Brod, MD Guidance:  Ultrasound and fluoroscopic FLUOROSCOPY: Fluoroscopy Time: (9.0 mGy). MEDICATIONS: Ancef 2 g within 1 hour of the procedure ANESTHESIA/SEDATION: Versed 0.5 mg IV; Fentanyl 25 mcg IV; Moderate Sedation Time:  18 minute The patient  was continuously monitored during the procedure by the interventional radiology nurse under my direct supervision. CONTRAST:  None. COMPLICATIONS: None immediate. PROCEDURE: Informed consent was obtained from the patient following explanation of the procedure, risks, benefits and alternatives. The patient understands, agrees and consents for the procedure. All questions were addressed. A time out was performed. Maximal barrier sterile technique utilized including caps, mask, sterile gowns, sterile gloves, large sterile drape, hand hygiene, and 2% chlorhexidine scrub. Under sterile conditions and local anesthesia, right internal jugular micropuncture venous access was performed with ultrasound. Images were obtained for documentation of the patent right internal jugular vein. A guide wire was inserted followed by a transitional dilator. Next, a 0.035 guidewire was advanced into the IVC with a 5-French catheter. Measurements were obtained from the right venotomy site to the proximal right atrium. In the right infraclavicular chest, a subcutaneous tunnel was created under sterile conditions and local anesthesia. 1% lidocaine with epinephrine was utilized for this. The 19 cm tip to cuff palindrome catheter was tunneled subcutaneously to the  venotomy site and inserted into the SVC/RA junction through a valved peel-away sheath. Position was confirmed with fluoroscopy. Images were obtained for documentation. Blood was aspirated from the catheter followed by saline and heparin flushes. The appropriate volume and strength of heparin was instilled in each lumen. Caps were applied. The catheter was secured at the tunnel site with Gelfoam and a pursestring suture. The venotomy site was closed with subcuticular Vicryl suture. Dermabond was applied to the small right neck incision. A dry sterile dressing was applied. The catheter is ready for use. No immediate complications. IMPRESSION: Ultrasound and fluoroscopically guided right  internal jugular tunneled hemodialysis catheter (19 cm tip to cuff palindrome catheter). Electronically Signed   By: Jerilynn Mages.  Shick M.D.   On: 09/15/2022 11:11   IR US Guide Vasc Access Right  Result Date: 09/15/2022 INDICATION: Access for long-term dialysis EXAM: ULTRASOUND GUIDANCE FOR VASCULAR ACCESS RIGHT INTERNAL JUGULAR PERMANENT HEMODIALYSIS CATHETER Date:  09/15/2022 09/15/2022 9:58 am Radiologist:  Jerilynn Mages. Daryll Brod, MD Guidance:  Ultrasound and fluoroscopic FLUOROSCOPY: Fluoroscopy Time: (9.0 mGy). MEDICATIONS: Ancef 2 g within 1 hour of the procedure ANESTHESIA/SEDATION: Versed 0.5 mg IV; Fentanyl 25 mcg IV; Moderate Sedation Time:  18 minute The patient was continuously monitored during the procedure by the interventional radiology nurse under my direct supervision. CONTRAST:  None. COMPLICATIONS: None immediate. PROCEDURE: Informed consent was obtained from the patient following explanation of the procedure, risks, benefits and alternatives. The patient understands, agrees and consents for the procedure. All questions were addressed. A time out was performed. Maximal barrier sterile technique utilized including caps, mask, sterile gowns, sterile gloves, large sterile drape, hand hygiene, and 2% chlorhexidine scrub. Under sterile conditions and local anesthesia, right internal jugular micropuncture venous access was performed with ultrasound. Images were obtained for documentation of the patent right internal jugular vein. A guide wire was inserted followed by a transitional dilator. Next, a 0.035 guidewire was advanced into the IVC with a 5-French catheter. Measurements were obtained from the right venotomy site to the proximal right atrium. In the right infraclavicular chest, a subcutaneous tunnel was created under sterile conditions and local anesthesia. 1% lidocaine with epinephrine was utilized for this. The 19 cm tip to cuff palindrome catheter was tunneled subcutaneously to the venotomy site and  inserted into the SVC/RA junction through a valved peel-away sheath. Position was confirmed with fluoroscopy. Images were obtained for documentation. Blood was aspirated from the catheter followed by saline and heparin flushes. The appropriate volume and strength of heparin was instilled in each lumen. Caps were applied. The catheter was secured at the tunnel site with Gelfoam and a pursestring suture. The venotomy site was closed with subcuticular Vicryl suture. Dermabond was applied to the small right neck incision. A dry sterile dressing was applied. The catheter is ready for use. No immediate complications. IMPRESSION: Ultrasound and fluoroscopically guided right internal jugular tunneled hemodialysis catheter (19 cm tip to cuff palindrome catheter). Electronically Signed   By: Jerilynn Mages.  Shick M.D.   On: 09/15/2022 11:11   DG Abd Portable 1V  Result Date: 09/14/2022 CLINICAL DATA:  Evaluate NG tube placement. EXAM: PORTABLE ABDOMEN - 1 VIEW COMPARISON:  None Available. FINDINGS: Interval placement of NG tube. The tip and side port are well below the level of the GE junction within the gastric fundus. No dilated bowel loops identified. IMPRESSION: Satisfactory position of NG tube with tip and side port in the gastric fundus. Electronically Signed   By: Kerby Moors M.D.   On: 09/14/2022  13:36        Scheduled Meds:  amLODipine  5 mg Per Tube BID   carvedilol  25 mg Per Tube BID WC   Chlorhexidine Gluconate Cloth  6 each Topical Q0600   Chlorhexidine Gluconate Cloth  6 each Topical Q0600   darbepoetin (ARANESP) injection - DIALYSIS  150 mcg Subcutaneous Q Mon-1800   [START ON 09/16/2022] feeding supplement (PROSource TF20)  60 mL Per Tube Daily   gelatin adsorbable       heparin injection (subcutaneous)  5,000 Units Subcutaneous Q8H   hydrALAZINE  100 mg Per Tube Q8H   insulin aspart  0-15 Units Subcutaneous Q6H   insulin glargine-yfgn  10 Units Subcutaneous Daily   multivitamin  1 tablet Per Tube  QHS   nutrition supplement (JUVEN)  1 packet Per Tube BID BM   mouth rinse  15 mL Mouth Rinse 4 times per day   pantoprazole (PROTONIX) IV  40 mg Intravenous Q12H   QUEtiapine  25 mg Oral Once   QUEtiapine  50 mg Per NG tube BID   sevelamer carbonate  2.4 g Per Tube TID WC   Continuous Infusions:  sodium chloride Stopped (08/30/22 0033)   feeding supplement (NEPRO CARB STEADY) 50 mL/hr at 09/12/22 1400     LOS: 22 days    Time spent: 45 minutes spent on chart review, discussion with nursing staff, consultants, updating family and interview/physical exam; more than 50% of that time was spent in counseling and/or coordination of care.    Valen Gillison J [place] DO [facility] Available via Epic secure chat 7am-7pm After these hours, please refer to coverage provider listed on [email or web address] 09/15/2022, 4:01 PM

## 2022-09-15 NOTE — Progress Notes (Signed)
Daily Progress Note   Patient Name: Randy Ross       Date: 09/15/2022 DOB: 09-Apr-1971  Age: 51 y.o. MRN#: [id number] Attending Physician: [place] Eric J, DO Primary Care Physician: Medicine, [place] Date: 08/24/2022  Reason for Consultation/Follow-up: Establishing goals of care  Subjective: Has been moved out of ICU since last seeing him.   Length of Stay: 22  Current Medications: Scheduled Meds:   amLODipine  5 mg Per Tube BID   carvedilol  25 mg Per Tube BID WC   Chlorhexidine Gluconate Cloth  6 each Topical Q0600   Chlorhexidine Gluconate Cloth  6 each Topical Q0600   darbepoetin (ARANESP) injection - DIALYSIS  150 mcg Subcutaneous Q Mon-1800   [START ON 09/16/2022] feeding supplement (PROSource TF20)  60 mL Per Tube Daily   gelatin adsorbable       heparin injection (subcutaneous)  5,000 Units Subcutaneous Q8H   hydrALAZINE  100 mg Per Tube Q8H   insulin aspart  0-15 Units Subcutaneous Q6H   insulin glargine-yfgn  10 Units Subcutaneous Daily   multivitamin  1 tablet Per Tube QHS   nutrition supplement (JUVEN)  1 packet Per Tube BID BM   mouth rinse  15 mL Mouth Rinse 4 times per day   pantoprazole (PROTONIX) IV  40 mg Intravenous Q12H   QUEtiapine  25 mg Oral Once   QUEtiapine  50 mg Per NG tube BID   sevelamer carbonate  2.4 g Per Tube TID WC    Continuous Infusions:  sodium chloride Stopped (08/30/22 0033)   feeding supplement (NEPRO CARB STEADY) 50 mL/hr at 09/12/22 1400    PRN Meds: sodium chloride, acetaminophen (TYLENOL) oral liquid 160 mg/5 mL, bisacodyl, fentaNYL (SUBLIMAZE) injection, gelatin adsorbable, hydrALAZINE, labetalol, ondansetron (ZOFRAN) IV, mouth rinse  Physical Exam Constitutional:      General: He is not in acute  distress.    Appearance: He is ill-appearing.     Comments: intermittently follows commands, awake looking around, looks at me occasionally when I speak to him  Cardiovascular:     Rate and Rhythm: Normal rate.  Pulmonary:     Effort: Pulmonary effort is normal.  Skin:    General: Skin is warm and dry.  Neurological:     Mental Status: He is alert.             Vital Signs: BP (!) 158/81 (BP Location: Right Arm)   Pulse 83   Temp 98.8 F (37.1 C) (Oral)   Resp 20   Ht '5\' 5"'$  (1.651 m)   Wt 72.8 kg   SpO2 100%   BMI 26.71 kg/m  SpO2: SpO2: 100 % O2 Device: O2 Device: Room Air O2 Flow Rate: O2 Flow Rate (L/min): 2 L/min  Intake/output summary:  Intake/Output Summary (Last 24 hours) at 09/15/2022 1421 Last data filed at 09/14/2022 1740 Gross per 24 hour  Intake --  Output 1800 ml  Net -1800 ml    LBM: Last BM Date : 09/14/22 Baseline Weight: Weight: 80.4 kg Most recent weight: Weight: 72.8 kg         Patient Active Problem List   Diagnosis Date Noted   Cerebral edema ([place])  30/06/2329   Acute embolic stroke ([place]) 07/62/2633   Lower GI bleeding 09/12/2022   Dysphagia 09/12/2022   Gastrointestinal hemorrhage 35/45/6256   Acute metabolic encephalopathy and delirium 09/10/2022   Pressure injury of skin 09/01/2022   Acute respiratory failure with hypoxia ([place]) 08/25/2022   Hypertensive emergency 08/25/2022   Encounter for continuous renal replacement therapy (CRRT) for acute renal failure ([place]) 08/25/2022   Acute cerebellar hemorrhage ([place]) 08/25/2022   ESRD (end stage renal disease) ([place]) 12/15/2021   Essential hypertension 12/15/2021   Diabetes mellitus ([place]) 12/15/2021   CKD (chronic kidney disease), stage IV ([place]) 12/08/2021   AKI (acute kidney injury) ([place]) 12/08/2021   MGUS (monoclonal gammopathy of unknown significance) 08/06/2019   Anemia 08/06/2019    Palliative Care Assessment & Plan   HPI: 51 y.o. male  with past medical history of Anemia, ESRD on  HD, diabetes, and hypertension admitted on 08/24/2022 with acute IPH with severe mass effect/brain swelling and hypertensive emergency.  Patient also with inability to protect airway due to mental status.  PMT consulted to discuss goals of care.   Per family patient goes by "[name]"  Assessment: Visited patient at bedside - no family present, staff member sitting at bedside to protect patient from pulling out feeding tube.   Patient with eyes open looking around. Occasionally looks at me when I speak to him. When I ask patient if he is in pain he shakes his head no. He squeezes my hand to command. He does not move lower extremities to command or make attempts to answer any other questions.   Multiple calls to family members with no answer and voicemails left. Eventually spoke with brother, Mina Marble. He confirms family would like another meeting to include whole family - patient's son coming to town Saturday - he asks if we can meet then.  Discussed with attending - planning to hold off on PEG placement until after family meeting.   Recommendations/Plan: Family meeting scheduled Saturday at 12 pm Continue full code/full scope treatment  Goals of Care and Additional Recommendations: Limitations on Scope of Treatment: Full Scope Treatment  Code Status: Full code  Discharge Planning: To Be Determined  Care plan was discussed with nursing staff, brother dang, Dr. [place]  Thank you for allowing the Palliative Medicine Team to assist in the care of this patient.   *Please note that this is a verbal dictation therefore any spelling or grammatical errors are due to the "[place] [employer]" system interpretation.  Juel Burrow, DNP, [facility] Palliative Medicine Team Team Phone # [phone]  Pager [phone]

## 2022-09-15 NOTE — Progress Notes (Signed)
[facility] Progress Note   Subjective:   seen in room - remains non communicative and in restraints. TDC in place.    Objective Vitals:   09/15/22 1015 09/15/22 1030 09/15/22 1047 09/15/22 1100  BP: (!) 149/99 (!) 145/94 (!) 145/92 (!) 158/81  Pulse: 90 96 91 83  Resp: '16 20 18 20  '$ Temp: 98.9 F (37.2 C) 98.8 F (37.1 C) 98.9 F (37.2 C) 98.8 F (37.1 C)  TempSrc: Axillary Oral  Oral  SpO2: 100%   100%  Weight:      Height:       Physical Exam General: awake but not interactive, NG tube in  Heart: RRR, no murmurs, rubs or gallops Lungs: CTA anteriorly Abdomen: Soft, non-distended, +BS Extremities: no edema b/l lower extremties Neuro: not following commands today Dialysis Access:  LUE AVF + bruit, R IJ Tunneled  HD catheter c/d/i    Dialysis Orders: SW MWF 3h 23mn  80kg   400/800   2/2 bath  Hep 2000  LFA AVF - last HD 11/13, post 80.1kg - mircera 150 q2, last 11/13 - hectorol 456m IV q HD  Assessment/Plan: 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 S/P course of hypertonic fluids.  Mental status remains poor.  HTN/ vol  - getting amlodipine/ coreg/amlodipine per tube, also has PRNs ordered. No edema on exam, BP's variable. Tolerated 3 L UF last 2 sessions. Now is 2kg under. Cont to lower vol w/ HD this week a bit more.  AMS - due to CVA/ bleed and now with delerium and ongoing MS changes ? If this is new baseline.  ESRD - pt required CRRT 11/15-11/17/23. Last HD 12/5 - was getting MWF but off schedule.  For now plan next HD 12/7 as no dispo plans made at this time.  HD access - BUN was very high so access recirc was suspected. Using temp HD cath placed 11/27 by CCM and BUN better. IR consulted for fistulogram - done 12/4 and noted to be small with competing veins.  [facility]  Anemia of ESRD: Hgb 9s, continue Aranesp 15055mweekly while here. Secondary HPTH: CCa ok. renvela per NG tube now at  goal. Continue VDRA.  DM2 - per pmd  GOC - full code - unclear where he'll go from here - if this is mainly delerium or his baseline status now.  Requiring restraints and not interacting.  Cortrak inserted for nutrition after failed swallow study and pulled out NG tube.   If this is his expected baseline long term I think it's really reasonable to get palliative care involved and consider hospice care.    LinJannifer Hick [facility] Pager [phone] Labs  Lab 09/13/22 [id number]/05/23 0650 09/15/22 0530  HGB 10.2* 9.5* 10.2*  ALBUMIN 3.0* 3.0* 3.2*  CALCIUM 9.2 8.8* 8.9  PHOS 5.1* 5.0*  --   CREATININE 9.40* 6.96* 6.68*  K 4.2 3.8 3.5     Inpatient medications:  amLODipine  5 mg Per Tube BID   carvedilol  25 mg Per Tube BID WC   Chlorhexidine Gluconate Cloth  6 each Topical Q0600   Chlorhexidine Gluconate Cloth  6 each Topical Q0600   darbepoetin (ARANESP) injection - DIALYSIS  150 mcg Subcutaneous Q Mon-1800   feeding supplement (PROSource TF20)  60 mL Per Tube BID   gelatin adsorbable       heparin injection (subcutaneous)  5,000 Units  Subcutaneous Q8H   hydrALAZINE  100 mg Per Tube Q8H   insulin aspart  0-15 Units Subcutaneous Q6H   insulin glargine-yfgn  10 Units Subcutaneous Daily   multivitamin  1 tablet Per Tube QHS   nutrition supplement (JUVEN)  1 packet Per Tube BID BM   mouth rinse  15 mL Mouth Rinse 4 times per day   pantoprazole (PROTONIX) IV  40 mg Intravenous Q12H   QUEtiapine  25 mg Oral Once   QUEtiapine  50 mg Per NG tube BID   sevelamer carbonate  2.4 g Per Tube TID WC    sodium chloride Stopped (08/30/22 0033)   desmopressin (DDAVP) 22 mcg in sodium chloride 0.9 % 50 mL IVPB     feeding supplement (NEPRO CARB STEADY) 50 mL/hr at 09/12/22 1400   sodium chloride, acetaminophen (TYLENOL) oral liquid 160 mg/5 mL, bisacodyl, fentaNYL (SUBLIMAZE) injection, gelatin adsorbable, hydrALAZINE, labetalol, ondansetron (ZOFRAN) IV, mouth  rinse

## 2022-09-15 NOTE — Progress Notes (Signed)
Nutrition Quick Note:   Please see note from 12/5 for full assessment.   Cortrak replaced today. Placement confirmed. Please restart TF as ordered.  Nepro @ 50 ml/h (1200 ml per day) Prosource TF 1x/d Provides 2204 kcal, 117 gm protein, 876 ml free water daily  RD will continue to monitor TF tolerance.   Thalia Bloodgood, RD, LDN, CNSC.

## 2022-09-16 DIAGNOSIS — Z515 Encounter for palliative care: Secondary | ICD-10-CM

## 2022-09-16 DIAGNOSIS — I614 Nontraumatic intracerebral hemorrhage in cerebellum: Secondary | ICD-10-CM | POA: Diagnosis not present

## 2022-09-16 LAB — GLUCOSE, CAPILLARY
Glucose-Capillary: 147 mg/dL — ABNORMAL HIGH (ref 70–99)
Glucose-Capillary: 154 mg/dL — ABNORMAL HIGH (ref 70–99)

## 2022-09-16 LAB — MAGNESIUM: Magnesium: 2.4 mg/dL (ref 1.7–2.4)

## 2022-09-16 MED ORDER — HEPARIN SODIUM (PORCINE) 1000 UNIT/ML IJ SOLN
INTRAMUSCULAR | Status: AC
  Filled 2022-09-16: qty 4

## 2022-09-16 NOTE — Progress Notes (Signed)
Daily Progress Note   Patient Name: Randy Ross       Date: 09/16/2022 DOB: [birth year]  Age: 51 y.o. MRN#: [id number] Attending Physician: [place] Eric J, DO Primary Care Physician: Medicine, [place] Date: 08/24/2022  Reason for Consultation/Follow-up: Establishing goals of care  Subjective: Has been moved out of ICU since last seeing him.   Length of Stay: 23  Current Medications: Scheduled Meds:   amLODipine  5 mg Per Tube BID   carvedilol  25 mg Per Tube BID WC   Chlorhexidine Gluconate Cloth  6 each Topical Q0600   Chlorhexidine Gluconate Cloth  6 each Topical Q0600   darbepoetin (ARANESP) injection - DIALYSIS  150 mcg Subcutaneous Q Mon-1800   feeding supplement (PROSource TF20)  60 mL Per Tube Daily   heparin injection (subcutaneous)  5,000 Units Subcutaneous Q8H   hydrALAZINE  100 mg Per Tube Q8H   insulin aspart  0-15 Units Subcutaneous Q6H   insulin glargine-yfgn  10 Units Subcutaneous Daily   multivitamin  1 tablet Per Tube QHS   nutrition supplement (JUVEN)  1 packet Per Tube BID BM   mouth rinse  15 mL Mouth Rinse 4 times per day   pantoprazole (PROTONIX) IV  40 mg Intravenous Q12H   QUEtiapine  25 mg Oral Once   QUEtiapine  50 mg Per NG tube BID   sevelamer carbonate  2.4 g Per Tube TID WC    Continuous Infusions:  sodium chloride Stopped (08/30/22 0033)   feeding supplement (NEPRO CARB STEADY) 1,000 mL (09/16/22 1245)    PRN Meds: sodium chloride, acetaminophen (TYLENOL) oral liquid 160 mg/5 mL, bisacodyl, fentaNYL (SUBLIMAZE) injection, hydrALAZINE, labetalol, ondansetron (ZOFRAN) IV, mouth rinse  Physical Exam Constitutional:      General: He is not in acute distress.    Appearance: He is ill-appearing.     Comments: Does not  follow commands for me today or turn towards my voice.  Eyes open but seems more drowsy today.  Cardiovascular:     Rate and Rhythm: Normal rate.  Pulmonary:     Effort: Pulmonary effort is normal.  Skin:    General: Skin is warm and dry.  Neurological:     Mental Status: He is alert.             Vital Signs: BP (!) 121/58 (BP Location: Left Arm)   Pulse 77   Temp 98.5 F (36.9 C) (Axillary)   Resp 12   Ht '5\' 5"'$  (1.651 m)   Wt 73.4 kg   SpO2 95%   BMI 26.93 kg/m  SpO2: SpO2: 95 % O2 Device: O2 Device: Room Air O2 Flow Rate: O2 Flow Rate (L/min): 2 L/min  Intake/output summary:  Intake/Output Summary (Last 24 hours) at 09/16/2022 1520 Last data filed at 09/16/2022 1400 Gross per 24 hour  Intake 0 ml  Output --  Net 0 ml    LBM: Last BM Date : 09/14/22 Baseline Weight: Weight: 80.4 kg Most recent weight: Weight: 73.4 kg         Patient Active Problem List   Diagnosis Date Noted   Cerebral edema ([place]) 32/44/0102   Acute embolic stroke ([place]) 72/53/6644  Lower GI bleeding 09/12/2022   Dysphagia 09/12/2022   Gastrointestinal hemorrhage 51/70/0174   Acute metabolic encephalopathy and delirium 09/10/2022   Pressure injury of skin 09/01/2022   Acute respiratory failure with hypoxia ([place]) 08/25/2022   Hypertensive emergency 08/25/2022   Encounter for continuous renal replacement therapy (CRRT) for acute renal failure ([place]) 08/25/2022   Acute cerebellar hemorrhage ([place]) 08/25/2022   ESRD (end stage renal disease) ([place]) 12/15/2021   Essential hypertension 12/15/2021   Diabetes mellitus ([place]) 12/15/2021   CKD (chronic kidney disease), stage IV ([place]) 12/08/2021   AKI (acute kidney injury) ([place]) 12/08/2021   MGUS (monoclonal gammopathy of unknown significance) 08/06/2019   Anemia 08/06/2019    Palliative Care Assessment & Plan   HPI: 51 y.o. male  with past medical history of Anemia, ESRD on HD, diabetes, and hypertension admitted on 08/24/2022 with acute IPH with  severe mass effect/brain swelling and hypertensive emergency.  Patient also with inability to protect airway due to mental status.  PMT consulted to discuss goals of care.   Per family patient goes by "[name]"  Assessment: Visited patient at bedside - no family present.  Joe is less interactive today and does not follow commands for me today.  Discussed situation with bedside nurse.  Patient not following commands for her either today.  Per chart review family has told TOC they want to take patient home.  Recommendations/Plan: Family meeting scheduled Saturday at 12 pm Continue full code/full scope treatment  Goals of Care and Additional Recommendations: Limitations on Scope of Treatment: Full Scope Treatment  Code Status: Full code  Discharge Planning: To Be Determined  Care plan was discussed with nursing staff  Thank you for allowing the Palliative Medicine Team to assist in the care of this patient.   *Please note that this is a verbal dictation therefore any spelling or grammatical errors are due to the "[place]" system interpretation.  Juel Burrow, DNP, [facility] Palliative Medicine Team Team Phone # [phone]  Pager [phone]

## 2022-09-16 NOTE — Progress Notes (Signed)
Patient off floor to dialysis

## 2022-09-16 NOTE — Progress Notes (Signed)
PROGRESS NOTE    Randy Ross  ZOX:[id number] DOB: [birth year] DOA: 08/24/2022 PCP: Medicine, [facility]    Brief Narrative:   Randy Ross is a 51 y.o. male with past medical history significant for ESRD on HD, DM2, essential hypertension who presented to [facility] ED with sudden onset slurred speech, headache.  Workup in the ER with elevated BP of 260/140 and CT head showing cerebellar hemorrhage.  Patient was initially intubated in the ED for airway protection and admitted to the PCCM/neurology service.  Significant Hospital events: 11/14: Admitted for acute cerebellar hemorrhage; intubated in the ER, started on hypertonic saline 11/15: Neurosurgery were consulted, recommended against surgical evacuation 11/16: Cefepime started for suspected aspiration 11/17: Required 1u PRBC transfusion due to CRRT filter 11/19: EEG nonspecific 11/21: MRI brain shows new scattered bilateral acute infarcts 11/22: Completed 7 days antibiotics 11/24: IR consulted given recirculation syndrome, high BUN; plan for fistulogram pending 11/27: Temp cath HD cath placed  11/30: Self-extubated 12/1: Persistent delirium 12/2: Transferred out of ICU to [facility] service 12/3: Pulled out cortrak 12/4: Failed SLP, needs cortrak, fistulogram shows poor outflow 12/5: TDC placed, feeding tube replaced 12/6: HD catheter placed by IR; palliative care scheduled family meeting for 12/9   Assessment & Plan:   Acute Cerebellar hemorrhage 2/2 poorly controlled hypertension Cerebral edema Patient presenting to ED via EMS with mental status change, left-sided weakness, slurred speech and left-sided gaze preference.  Patient was noted to be severely hypertensive on admission.  Patient was subsequently intubated for airway protection and CT notable for IPH right cerebellar hemisphere with mass effect.  Patient was started on Cleviprex, Versed and admitted under the critical care/neurology service.   Patient was seen by neurosurgery who did not recommend surgical intervention; and patient was started on hypertonic saline and placed on CRRT to minimize rapid fluid shifts. -- Continues with poor progression in terms of mental status, repeat CT head x 3 remained stable overall very poor prognosis and palliative care following.  Neurology now signed off.  And CRRT converted back to HD per nephrology.  Family meeting planned for 12/9; anticipate need for transition to comfort measures.  Dysphagia Etiology low please secondary to persistent encephalopathy, patient has pulled out core track [name], replaced today.  Palliative care following, plan for family meeting on 12/9 and if no progression towards oral intake and family still wanting aggressive measures will need PEG tube placed. -- Continue tube feeds via core track until further family discussion -- Aspiration precautions  Lower GI bleeding: Resolved Noted by nurse, transient, no change in hemoglobin.  No further bleeding reported. 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  TTE with LVEF 50-55%.  LDL 91, hemoglobin A1c 5.9.  Holding antiplatelet for anemia, cerebellar hemorrhage.  Neurology now signed off; and do not recommend further evaluation for cardiac source of embolism as he is not a good long-term anticoagulation candidate.  Hypertensive emergency Hx essential hypertension BP severely elevated on admission, likely leading to acute cerebellar hemorrhage. -- Amlodipine 5 mg per tube twice daily -- Carvedilol 25 mg per tube twice daily -- Hydralazine 100 mg per tube every 8 hours  Acute respiratory failure with hypoxia: Resolved Aspiration pneumonia Patient was initially intubated for airway protection upon ED arrival, patient self-extubated on 11/30.  Completed course of  antibiotics with cefepime followed by ceftriaxone.  Acute metabolic encephalopathy  Patient continues to not be able to follow commands, likely secondary to brain insult. --Supportive care, Seroquel   Type 2 diabetes mellitus Hemoglobin A1c 5.9 08/25/2022, well-controlled. -- Semglee 10 units Trosky daily -- Moderate SSI for coverage -- CBGs qAC/HS  ESRD on HD -- Continue HD per nephrology  Anemia of chronic medical/renal disease Hemoglobin stable, continue intermittent monitoring.  Transfuse hemoglobin send 7.0  Clotted fistula Fistulogram showed poor outflow from fistula, nephrology consulted IR for HD catheter placement which was performed on 12/6.   DVT prophylaxis: heparin injection 5,000 Units Start: 09/12/22 1445 Place and maintain sequential compression device Start: 08/25/22 1025    Code Status: Full Code Family Communication: No family present at bedside this morning; family meeting planned for Saturday, 09/18/2022 with palliative care  Disposition Plan:  Level of care: Telemetry Medical Status is: Inpatient Remains inpatient appropriate because: Pending placement, family meeting planned for 12/9, may need to consider comfort measures with hospice if family wishes patient to return home.    Consultants:  PCCM: Signed off 12/2 Neurology: Signed off 12/4 Neurosurgery Nephrology Interventional radiology Palliative care  Procedures:  HD catheter placement, IR 12/6  Antimicrobials:  Cefepime 11/17 - 11/18 Ceftriaxone 11/19 - 11/22 Cefazolin 12/6 - 12/6   Subjective: Patient seen examined at bedside, resting comfortably.  Lying in bed.  Remains knish confused, in restraints, nonconversant.  No acute events overnight per nursing staff.  Meeting planned for Saturday.  Objective: Vitals:   09/15/22 1952 09/15/22 2326 09/16/22 0735 09/16/22 1122  BP: (!) 155/74 129/78 (!) 143/82 (!) 143/80  Pulse: 88 88 80 72  Resp: '15 16 19 19  '$ Temp: 98.7 F (37.1 C) 98.5 F  (36.9 C) 97.9 F (36.6 C) 98.2 F (36.8 C)  TempSrc: Oral Oral Oral Oral  SpO2: 96% 98%  98%  Weight:      Height:       No intake or output data in the 24 hours ending 09/16/22 1351  Filed Weights   09/13/22 1910 09/14/22 1415 09/14/22 1754  Weight: 76.1 kg 74.6 kg 72.8 kg    Examination:  Physical Exam: GEN: NAD, lying in bed, nonverbal makes eye contact PULM: CTAB w/o wheezes/crackles, normal respiratory effort on room air CV: RRR w/o M/G/R GI: abd soft, NTND, NABS MSK: no peripheral edema    Data Reviewed: I have personally reviewed following labs and imaging studies  CBC: Recent Labs  Lab 09/11/22 0640 09/12/22 0336 09/13/22 0722 09/14/22 0650 09/15/22 0530  WBC 18.8* 14.3* 14.1* 10.9* 10.1  HGB 10.0* 9.9* 10.2* 9.5* 10.2*  HCT 31.3* 29.9* 30.3* 30.1* 32.8*  MCV 70.8* 69.1* 68.1* 70.3* 70.7*  PLT 822* 770* 728* 683* 856*   Basic Metabolic Panel: Recent Labs  Lab 09/10/22 0421 09/11/22 0640 09/12/22 0336 09/13/22 0722 09/14/22 0650 09/15/22 0530 09/16/22 0634  NA 133* 137 135 133* 137 137  --   K 3.8 3.7 4.1 4.2 3.8 3.5  --   CL 91* 99 96* 93* 99 98  --   CO2 '27 25 24 '$ 20* 24 26  --   GLUCOSE 183* 136* 118* 114* 93 100*  --   BUN 97* 60* 121* 168* 81* 59*  --   CREATININE 6.33* 4.68* 6.70* 9.40* 6.96* 6.68*  --   CALCIUM 9.6 9.3 9.2 9.2 8.8* 8.9  --   MG 2.1 2.0 2.4 2.4 2.3 2.0 2.4  PHOS 5.2* 3.4 4.0 5.1* 5.0*  --   --    GFR: Estimated Creatinine Clearance:  11.4 mL/min (A) (by C-G formula based on SCr of 6.68 mg/dL (H)). Liver Function Tests: Recent Labs  Lab 09/11/22 0640 09/12/22 0336 09/13/22 0722 09/14/22 0650 09/15/22 0530  AST  --   --   --   --  30  ALT  --   --   --   --  43  ALKPHOS  --   --   --   --  91  BILITOT  --   --   --   --  1.0  PROT  --   --   --   --  7.9  ALBUMIN 2.9* 2.8* 3.0* 3.0* 3.2*   No results for input(s): "LIPASE", "AMYLASE" in the last 168 hours. No results for input(s): "AMMONIA" in the last 168  hours. Coagulation Profile: No results for input(s): "INR", "PROTIME" in the last 168 hours. Cardiac Enzymes: No results for input(s): "CKTOTAL", "CKMB", "CKMBINDEX", "TROPONINI" in the last 168 hours. BNP (last 3 results) No results for input(s): "PROBNP" in the last 8760 hours. HbA1C: No results for input(s): "HGBA1C" in the last 72 hours. CBG: Recent Labs  Lab 09/15/22 1212 09/15/22 1839 09/15/22 2325 09/16/22 0537 09/16/22 1217  GLUCAP 130* 189* 183* 147* 154*   Lipid Profile: Recent Labs    09/14/22 0650  TRIG 88   Thyroid Function Tests: No results for input(s): "TSH", "T4TOTAL", "FREET4", "T3FREE", "THYROIDAB" in the last 72 hours. Anemia Panel: No results for input(s): "VITAMINB12", "FOLATE", "FERRITIN", "TIBC", "IRON", "RETICCTPCT" in the last 72 hours. Sepsis Labs: No results for input(s): "PROCALCITON", "LATICACIDVEN" in the last 168 hours.  No results found for this or any previous visit (from the past 240 hour(s)).       Radiology Studies: DG Abd Portable 1V  Result Date: 09/15/2022 CLINICAL DATA:  Feeding tube placement EXAM: PORTABLE ABDOMEN - 1 VIEW COMPARISON:  09/14/2022 FINDINGS: Non weighted enteric feeding tube is positioned with tip below the diaphragm in the vicinity of the pylorus or duodenal bulb. Nonobstructive pattern of included bowel gas. No free air. IMPRESSION: Non weighted enteric feeding tube is positioned with tip below the diaphragm in the vicinity of the pylorus or duodenal bulb. Electronically Signed   By: Delanna Ahmadi M.D.   On: 09/15/2022 12:10   IR Fluoro Guide CV Line Right  Result Date: 09/15/2022 INDICATION: Access for long-term dialysis EXAM: ULTRASOUND GUIDANCE FOR VASCULAR ACCESS RIGHT INTERNAL JUGULAR PERMANENT HEMODIALYSIS CATHETER Date:  09/15/2022 09/15/2022 9:58 am Radiologist:  Jerilynn Mages. Daryll Brod, MD Guidance:  Ultrasound and fluoroscopic FLUOROSCOPY: Fluoroscopy Time: (9.0 mGy). MEDICATIONS: Ancef 2 g within 1 hour of the  procedure ANESTHESIA/SEDATION: Versed 0.5 mg IV; Fentanyl 25 mcg IV; Moderate Sedation Time:  18 minute The patient was continuously monitored during the procedure by the interventional radiology nurse under my direct supervision. CONTRAST:  None. COMPLICATIONS: None immediate. PROCEDURE: Informed consent was obtained from the patient following explanation of the procedure, risks, benefits and alternatives. The patient understands, agrees and consents for the procedure. All questions were addressed. A time out was performed. Maximal barrier sterile technique utilized including caps, mask, sterile gowns, sterile gloves, large sterile drape, hand hygiene, and 2% chlorhexidine scrub. Under sterile conditions and local anesthesia, right internal jugular micropuncture venous access was performed with ultrasound. Images were obtained for documentation of the patent right internal jugular vein. A guide wire was inserted followed by a transitional dilator. Next, a 0.035 guidewire was advanced into the IVC with a 5-French catheter. Measurements were obtained from the right  venotomy site to the proximal right atrium. In the right infraclavicular chest, a subcutaneous tunnel was created under sterile conditions and local anesthesia. 1% lidocaine with epinephrine was utilized for this. The 19 cm tip to cuff palindrome catheter was tunneled subcutaneously to the venotomy site and inserted into the SVC/RA junction through a valved peel-away sheath. Position was confirmed with fluoroscopy. Images were obtained for documentation. Blood was aspirated from the catheter followed by saline and heparin flushes. The appropriate volume and strength of heparin was instilled in each lumen. Caps were applied. The catheter was secured at the tunnel site with Gelfoam and a pursestring suture. The venotomy site was closed with subcuticular Vicryl suture. Dermabond was applied to the small right neck incision. A dry sterile dressing was applied.  The catheter is ready for use. No immediate complications. IMPRESSION: Ultrasound and fluoroscopically guided right internal jugular tunneled hemodialysis catheter (19 cm tip to cuff palindrome catheter). Electronically Signed   By: Jerilynn Mages.  Shick M.D.   On: 09/15/2022 11:11   IR US Guide Vasc Access Right  Result Date: 09/15/2022 INDICATION: Access for long-term dialysis EXAM: ULTRASOUND GUIDANCE FOR VASCULAR ACCESS RIGHT INTERNAL JUGULAR PERMANENT HEMODIALYSIS CATHETER Date:  09/15/2022 09/15/2022 9:58 am Radiologist:  Jerilynn Mages. Daryll Brod, MD Guidance:  Ultrasound and fluoroscopic FLUOROSCOPY: Fluoroscopy Time: (9.0 mGy). MEDICATIONS: Ancef 2 g within 1 hour of the procedure ANESTHESIA/SEDATION: Versed 0.5 mg IV; Fentanyl 25 mcg IV; Moderate Sedation Time:  18 minute The patient was continuously monitored during the procedure by the interventional radiology nurse under my direct supervision. CONTRAST:  None. COMPLICATIONS: None immediate. PROCEDURE: Informed consent was obtained from the patient following explanation of the procedure, risks, benefits and alternatives. The patient understands, agrees and consents for the procedure. All questions were addressed. A time out was performed. Maximal barrier sterile technique utilized including caps, mask, sterile gowns, sterile gloves, large sterile drape, hand hygiene, and 2% chlorhexidine scrub. Under sterile conditions and local anesthesia, right internal jugular micropuncture venous access was performed with ultrasound. Images were obtained for documentation of the patent right internal jugular vein. A guide wire was inserted followed by a transitional dilator. Next, a 0.035 guidewire was advanced into the IVC with a 5-French catheter. Measurements were obtained from the right venotomy site to the proximal right atrium. In the right infraclavicular chest, a subcutaneous tunnel was created under sterile conditions and local anesthesia. 1% lidocaine with epinephrine was  utilized for this. The 19 cm tip to cuff palindrome catheter was tunneled subcutaneously to the venotomy site and inserted into the SVC/RA junction through a valved peel-away sheath. Position was confirmed with fluoroscopy. Images were obtained for documentation. Blood was aspirated from the catheter followed by saline and heparin flushes. The appropriate volume and strength of heparin was instilled in each lumen. Caps were applied. The catheter was secured at the tunnel site with Gelfoam and a pursestring suture. The venotomy site was closed with subcuticular Vicryl suture. Dermabond was applied to the small right neck incision. A dry sterile dressing was applied. The catheter is ready for use. No immediate complications. IMPRESSION: Ultrasound and fluoroscopically guided right internal jugular tunneled hemodialysis catheter (19 cm tip to cuff palindrome catheter). Electronically Signed   By: Jerilynn Mages.  Shick M.D.   On: 09/15/2022 11:11        Scheduled Meds:  amLODipine  5 mg Per Tube BID   carvedilol  25 mg Per Tube BID WC   Chlorhexidine Gluconate Cloth  6 each Topical Q0600   Chlorhexidine  Gluconate Cloth  6 each Topical Q0600   darbepoetin (ARANESP) injection - DIALYSIS  150 mcg Subcutaneous Q Mon-1800   feeding supplement (PROSource TF20)  60 mL Per Tube Daily   heparin injection (subcutaneous)  5,000 Units Subcutaneous Q8H   hydrALAZINE  100 mg Per Tube Q8H   insulin aspart  0-15 Units Subcutaneous Q6H   insulin glargine-yfgn  10 Units Subcutaneous Daily   multivitamin  1 tablet Per Tube QHS   nutrition supplement (JUVEN)  1 packet Per Tube BID BM   mouth rinse  15 mL Mouth Rinse 4 times per day   pantoprazole (PROTONIX) IV  40 mg Intravenous Q12H   QUEtiapine  25 mg Oral Once   QUEtiapine  50 mg Per NG tube BID   sevelamer carbonate  2.4 g Per Tube TID WC   Continuous Infusions:  sodium chloride Stopped (08/30/22 0033)   feeding supplement (NEPRO CARB STEADY) 1,000 mL (09/16/22 1245)      LOS: 23 days    Time spent: 45 minutes spent on chart review, discussion with nursing staff, consultants, updating family and interview/physical exam; more than 50% of that time was spent in counseling and/or coordination of care.    Jerah Esty J [place] DO [facility] Available via Epic secure chat 7am-7pm After these hours, please refer to coverage provider listed on [email or web address] 09/16/2022, 1:51 PM

## 2022-09-16 NOTE — Progress Notes (Signed)
Speech Language Pathology Treatment: Dysphagia  Patient Details Name: Randy Ross MRN: [id number] DOB: [birth year] Today's Date: 09/16/2022 Time: 5102-5852 SLP Time Calculation (min) (ACUTE ONLY): 10 min  Assessment / Plan / Recommendation Clinical Impression  Pt was seen for dysphagia treatment. His alertness improved with verbal and tactile stimulation, but remained suboptimal Pt opened his eyes with stimulation and vocalized, but exhibited difficulty maintaining this level of alertness for prolonged periods and no verbalization was noted. Oral care was provided, but with moderate resistance from pt. Boluses of puree, ice chips, and thin liquids remained in the oral cavity without any attempt at bolus manipulation; boluses were ultimately removed via suction. It is recommended that the pt's NPO status be maintained with continued enteral nutrition. SLP will continue to follow pt.     HPI HPI: Pt is a 51 y.o. male who presented with sudden onset slurred speech, chest pain, left sided weakness. Surrounding edema in the face of the fourth ventricle without hydrocephalus. 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 ETT 11/14-11/30 (self-extubated). Palliative meeting 12/1: full scope. PMH: end-stage renal disease on intermittent hemodialysis, diabetes, hypertension.      SLP Plan  Continue with current plan of care      Recommendations for follow up therapy are one component of a multi-disciplinary discharge planning process, led by the attending physician.  Recommendations may be updated based on patient status, additional functional criteria and insurance authorization.    Recommendations  Diet recommendations: NPO Medication Administration: Via alternative means                Oral Care Recommendations: Oral care QID Follow Up Recommendations: SLP at Long-term acute care hospital SLP Visit Diagnosis: Dysphagia, unspecified  (R13.10) Plan: Continue with current plan of care         Lovie Agresta I. Hardin Negus, [place], [place] Office number [phone]  [name]  09/16/2022, 11:29 AM

## 2022-09-16 NOTE — Progress Notes (Signed)
Received patient in bed to unit.  Alert and oriented.  Informed consent signed and in chart.   Treatment initiated: 1450 Treatment completed: 1830  Patient tolerated well.  Transported back to the room  Alert, without acute distress.  Hand-off given to patient's nurse.   Access used: Catheter  Access issues: none  Total UF removed: 2.5L Medication(s) given: None Post HD VS: 112/70,84,20,97.9 Post HD weight: 70.9kg   [facility]

## 2022-09-16 NOTE — Progress Notes (Signed)
[facility] Progress Note   Subjective:   seen in room - remains non communicative and in restraints. TDC in place.  Had bleeding after placement and IR evaluated but no intervention other than pressure dressing required.  Pall care following.  Objective Vitals:   09/15/22 1700 09/15/22 1952 09/15/22 2326 09/16/22 0735  BP: (!) 148/70 (!) 155/74 129/78 (!) 143/82  Pulse:  88 88 80  Resp:  '15 16 19  '$ Temp:  98.7 F (37.1 C) 98.5 F (36.9 C) 97.9 F (36.6 C)  TempSrc:  Oral Oral Oral  SpO2:  96% 98%   Weight:      Height:       Physical Exam General: awake but not interactive, NG tube in  Heart: RRR, no murmurs, rubs or gallops Lungs: CTA anteriorly Abdomen: Soft, non-distended, +BS Extremities: no edema b/l lower extremties Neuro: not following commands today Dialysis Access:  LUE AVF + bruit, R IJ Tunneled  HD catheter c/d/i    Dialysis Orders: SW MWF 3h 74mn  80kg   400/800   2/2 bath  Hep 2000  LFA AVF - last HD 11/13, post 80.1kg - mircera 150 q2, last 11/13 - hectorol 426m IV q HD  Assessment/Plan: 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 S/P course of hypertonic fluids.  Mental status remains poor.  HTN/ vol  - getting amlodipine/ coreg/amlodipine per tube, also has PRNs ordered. No edema on exam, BP's variable. Appears euvolemic now. AMS - due to CVA/ bleed and now with delerium and ongoing MS changes ? If this is new baseline.   ESRD - pt required CRRT 11/15-11/17/23. Last HD 12/5 - was getting MWF but off schedule.  For now plan next HD 12/7 as no dispo plans made at this time.  HD access - BUN was very high so access recirc was suspected. Using temp HD cath placed 11/27 by CCM and BUN better. IR consulted for fistulogram - done 12/4 and noted to be small with competing veins.  Use catheter for now [place]6.  Anemia of ESRD: Hgb 9s, continue Aranesp 15021mweekly while here. Secondary HPTH: CCa ok.  renvela per NG tube now at goal. Continue VDRA.  DM2 - per pmd  GOC - full code - unclear where he'll go from here - if this is mainly delerium or his baseline status now.  Requiring restraints and not interacting.  Cortrak inserted for nutrition after failed swallow study and pulled out NG tube.   If this is his expected baseline long term I think it's really reasonable to get palliative care involved and consider hospice care -- they are discussing with family.  SOunds like family wants him home - the only way I see that happening would be with hospice otherwise its a facility.   LinJannifer Hick [facility] Pager [phone] Labs  Lab 09/13/22 [id number]/03 0650 09/15/22 0530  HGB 10.2* 9.5* 10.2*  ALBUMIN 3.0* 3.0* 3.2*  CALCIUM 9.2 8.8* 8.9  PHOS 5.1* 5.0*  --   CREATININE 9.40* 6.96* 6.68*  K 4.2 3.8 3.5     Inpatient medications:  amLODipine  5 mg Per Tube BID   carvedilol  25 mg Per Tube BID WC   Chlorhexidine Gluconate Cloth  6 each Topical Q0600   Chlorhexidine Gluconate Cloth  6 each Topical Q0600   darbepoetin (ARANESP) injection - DIALYSIS  150 mcg Subcutaneous Q Mon-1800   feeding supplement (PROSource  TF20)  60 mL Per Tube Daily   heparin injection (subcutaneous)  5,000 Units Subcutaneous Q8H   hydrALAZINE  100 mg Per Tube Q8H   insulin aspart  0-15 Units Subcutaneous Q6H   insulin glargine-yfgn  10 Units Subcutaneous Daily   multivitamin  1 tablet Per Tube QHS   nutrition supplement (JUVEN)  1 packet Per Tube BID BM   mouth rinse  15 mL Mouth Rinse 4 times per day   pantoprazole (PROTONIX) IV  40 mg Intravenous Q12H   QUEtiapine  25 mg Oral Once   QUEtiapine  50 mg Per NG tube BID   sevelamer carbonate  2.4 g Per Tube TID WC    sodium chloride Stopped (08/30/22 0033)   feeding supplement (NEPRO CARB STEADY) 1,000 mL (09/15/22 1600)   sodium chloride, acetaminophen (TYLENOL) oral liquid 160 mg/5 mL, bisacodyl, fentaNYL (SUBLIMAZE)  injection, hydrALAZINE, labetalol, ondansetron (ZOFRAN) IV, mouth rinse

## 2022-09-17 DIAGNOSIS — Z515 Encounter for palliative care: Secondary | ICD-10-CM | POA: Diagnosis not present

## 2022-09-17 DIAGNOSIS — I614 Nontraumatic intracerebral hemorrhage in cerebellum: Secondary | ICD-10-CM | POA: Diagnosis not present

## 2022-09-17 LAB — BASIC METABOLIC PANEL
Anion gap: 15 (ref 5–15)
BUN: 35 mg/dL — ABNORMAL HIGH (ref 6–20)
CO2: 26 mmol/L (ref 22–32)
Calcium: 9.2 mg/dL (ref 8.9–10.3)
Chloride: 89 mmol/L — ABNORMAL LOW (ref 98–111)
Creatinine, Ser: 5.12 mg/dL — ABNORMAL HIGH (ref 0.61–1.24)
GFR, Estimated: 13 mL/min — ABNORMAL LOW (ref >60)
Glucose, Bld: 90 mg/dL (ref 70–99)
Potassium: 4.3 mmol/L (ref 3.5–5.1)
Sodium: 130 mmol/L — ABNORMAL LOW (ref 135–145)

## 2022-09-17 LAB — GLUCOSE, CAPILLARY
Glucose-Capillary: 131 mg/dL — ABNORMAL HIGH (ref 70–99)
Glucose-Capillary: 147 mg/dL — ABNORMAL HIGH (ref 70–99)
Glucose-Capillary: 214 mg/dL — ABNORMAL HIGH (ref 70–99)

## 2022-09-17 LAB — CBC
HCT: 34.3 % — ABNORMAL LOW (ref 39.0–52.0)
Hemoglobin: 10.9 g/dL — ABNORMAL LOW (ref 13.0–17.0)
MCH: 22.4 pg — ABNORMAL LOW (ref 26.0–34.0)
MCHC: 31.8 g/dL (ref 30.0–36.0)
MCV: 70.4 fL — ABNORMAL LOW (ref 80.0–100.0)
Platelets: 517 10*3/uL — ABNORMAL HIGH (ref 150–400)
RBC: 4.87 MIL/uL (ref 4.22–5.81)
RDW: 21.2 % — ABNORMAL HIGH (ref 11.5–15.5)
WBC: 10.8 10*3/uL — ABNORMAL HIGH (ref 4.0–10.5)
nRBC: 0 % (ref 0.0–0.2)

## 2022-09-17 LAB — TRIGLYCERIDES: Triglycerides: 131 mg/dL (ref <150)

## 2022-09-17 LAB — MAGNESIUM: Magnesium: 2 mg/dL (ref 1.7–2.4)

## 2022-09-17 MED ORDER — LORAZEPAM 2 MG/ML IJ SOLN
0.5000 mg | Freq: Four times a day (QID) | INTRAMUSCULAR | Status: DC | PRN
  Administered 2022-09-17 – 2022-09-29 (×10): 0.5 mg via INTRAVENOUS
  Filled 2022-09-17 (×11): qty 1

## 2022-09-17 NOTE — Progress Notes (Signed)
[facility] Progress Note   Subjective:   seen in room - remains non communicative and in restraints. TDC in place.    Pall care following.  Had uneventful dialysis yesterday.   Objective Vitals:   09/17/22 0017 09/17/22 0349 09/17/22 0526 09/17/22 0825  BP: 110/67 127/79 126/78 113/65  Pulse: 80 77  75  Resp: '14 16  20  '$ Temp: 97.9 F (36.6 C) 98.1 F (36.7 C)  99.4 F (37.4 C)  TempSrc: Oral Oral  Oral  SpO2: 99% 98%  98%  Weight:      Height:       Physical Exam General: awake but not interactive, NG tube in  Heart: RRR, no murmurs, rubs or gallops Lungs: CTA anteriorly Abdomen: Soft, non-distended, +BS Extremities: no edema b/l lower extremties Neuro: not following commands today Dialysis Access:  LUE AVF + bruit, R IJ Tunneled  HD catheter c/d/i    Dialysis Orders: SW MWF 3h 3mn  80kg   400/800   2/2 bath  Hep 2000  LFA AVF - last HD 11/13, post 80.1kg - mircera 150 q2, last 11/13 - hectorol 423m IV q HD  Assessment/Plan: 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 S/P course of hypertonic fluids.  Mental status remains poor.  HTN/ vol  - getting amlodipine/ coreg/amlodipine per tube, also has PRNs ordered. No edema on exam, BP's variable. Appears euvolemic now. AMS - due to CVA/ bleed and now with delerium and ongoing MS changes. Appears this is his new baseline.   ESRD - pt required CRRT 11/15-11/17/23. Last HD 12/5 - was getting MWF but off schedule.  For now plan next HD 12/9 but later in day as family meeting is at noon - if GOC change would hold tx.   HD access - BUN was very high so access recirc was suspected. Using temp HD cath placed 11/27 by CCM and BUN better. IR consulted for fistulogram - done 12/4 and noted to be small with competing veins.  [facility]  Anemia of ESRD: Hgb 9s, continue Aranesp 15026mweekly while here. Secondary HPTH: CCa ok. renvela per NG tube now at goal.  Continue VDRA.  DM2 - per pmd  GOC - full code - unclear where he'll go from here - if this is mainly delerium or his baseline status now.  Requiring restraints and not interacting.  Cortrak inserted for nutrition after failed swallow study and pulled out NG tube.   If this is his expected baseline long term I think it's really reasonable to consider hospice care -- they are discussing with family which pall care is doing.  Family meeting tomorrow noon.  SOunds like family wants him home - the only way I see that happening would be with hospice otherwise its a facility.   LinJannifer Hick [facility] Pager [phone] Labs  Lab 09/13/22 [id number]/03 0650 09/15/22 0530 09/17/22 0435  HGB 10.2* 9.5* 10.2* 10.9*  ALBUMIN 3.0* 3.0* 3.2*  --   CALCIUM 9.2 8.8* 8.9 9.2  PHOS 5.1* 5.0*  --   --   CREATININE 9.40* 6.96* 6.68* 5.12*  K 4.2 3.8 3.5 4.3     Inpatient medications:  amLODipine  5 mg Per Tube BID   carvedilol  25 mg Per Tube BID WC   Chlorhexidine Gluconate Cloth  6 each Topical Q0600   Chlorhexidine Gluconate Cloth  6 each Topical Q0600  darbepoetin (ARANESP) injection - DIALYSIS  150 mcg Subcutaneous Q Mon-1800   feeding supplement (PROSource TF20)  60 mL Per Tube Daily   heparin injection (subcutaneous)  5,000 Units Subcutaneous Q8H   hydrALAZINE  100 mg Per Tube Q8H   insulin aspart  0-15 Units Subcutaneous Q6H   insulin glargine-yfgn  10 Units Subcutaneous Daily   multivitamin  1 tablet Per Tube QHS   nutrition supplement (JUVEN)  1 packet Per Tube BID BM   mouth rinse  15 mL Mouth Rinse 4 times per day   pantoprazole (PROTONIX) IV  40 mg Intravenous Q12H   QUEtiapine  25 mg Oral Once   QUEtiapine  50 mg Per NG tube BID   sevelamer carbonate  2.4 g Per Tube TID WC    sodium chloride Stopped (08/30/22 0033)   feeding supplement (NEPRO CARB STEADY) 1,000 mL (09/16/22 1245)   sodium chloride, acetaminophen (TYLENOL) oral liquid 160 mg/5 mL,  bisacodyl, fentaNYL (SUBLIMAZE) injection, hydrALAZINE, labetalol, ondansetron (ZOFRAN) IV, mouth rinse

## 2022-09-17 NOTE — Progress Notes (Signed)
PROGRESS NOTE    Aramis Weil  WGY:[id number] DOB: 03-13-1971 DOA: 08/24/2022 PCP: Medicine, [place] [facility]    Brief Narrative:   Karanveer Ramakrishnan is a 51 y.o. male with past medical history significant for ESRD on HD, DM2, essential hypertension who presented to [facility] ED with sudden onset slurred speech, headache.  Workup in the ER with elevated BP of 260/140 and CT head showing cerebellar hemorrhage.  Patient was initially intubated in the ED for airway protection and admitted to the PCCM/neurology service.  Significant Hospital events: 11/14: Admitted for acute cerebellar hemorrhage; intubated in the ER, started on hypertonic saline 11/15: Neurosurgery were consulted, recommended against surgical evacuation 11/16: Cefepime started for suspected aspiration 11/17: Required 1u PRBC transfusion due to CRRT filter 11/19: EEG nonspecific 11/21: MRI brain shows new scattered bilateral acute infarcts 11/22: Completed 7 days antibiotics 11/24: IR consulted given recirculation syndrome, high BUN; plan for fistulogram pending 11/27: Temp cath HD cath placed  11/30: Self-extubated 12/1: Persistent delirium 12/2: Transferred out of ICU to [facility] service 12/3: Pulled out cortrak 12/4: Failed SLP, needs cortrak, fistulogram shows poor outflow 12/5: TDC placed, feeding tube replaced 12/6: HD catheter placed by IR; palliative care scheduled family meeting for 12/9   Assessment & Plan:   Acute Cerebellar hemorrhage 2/2 poorly controlled hypertension Cerebral edema Patient presenting to ED via EMS with mental status change, left-sided weakness, slurred speech and left-sided gaze preference.  Patient was noted to be severely hypertensive on admission.  Patient was subsequently intubated for airway protection and CT notable for IPH right cerebellar hemisphere with mass effect.  Patient was started on Cleviprex, Versed and admitted under the critical care/neurology service.   Patient was seen by neurosurgery who did not recommend surgical intervention; and patient was started on hypertonic saline and placed on CRRT to minimize rapid fluid shifts. -- Continues with poor progression in terms of mental status, repeat CT head x 3 remained stable overall very poor prognosis and palliative care following.  Neurology now signed off.  And CRRT converted back to HD per nephrology.  Family meeting planned for 12/9; anticipate need for transition to comfort measures.  Dysphagia Etiology low please secondary to persistent encephalopathy, patient has pulled out core track [name], replaced today.  Palliative care following, plan for family meeting on 12/9 and if no progression towards oral intake and family still wanting aggressive measures will need PEG tube placed. -- Continue tube feeds via core track until further family discussion -- Aspiration precautions  Lower GI bleeding: Resolved Noted by nurse, transient, no change in hemoglobin.  No further bleeding reported. 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  TTE with LVEF 50-55%.  LDL 91, hemoglobin A1c 5.9.  Holding antiplatelet for anemia, cerebellar hemorrhage.  Neurology now signed off; and do not recommend further evaluation for cardiac source of embolism as he is not a good long-term anticoagulation candidate.  Hypertensive emergency Hx essential hypertension BP severely elevated on admission, likely leading to acute cerebellar hemorrhage. -- Amlodipine 5 mg per tube twice daily -- Carvedilol 25 mg per tube twice daily -- Hydralazine 100 mg per tube every 8 hours  Acute respiratory failure with hypoxia: Resolved Aspiration pneumonia Patient was initially intubated for airway protection upon ED arrival, patient self-extubated on 11/30.  Completed course of  antibiotics with cefepime followed by ceftriaxone.  Acute metabolic encephalopathy  Patient continues to not be able to follow commands, likely secondary to brain insult. --Supportive care, Seroquel   Type 2 diabetes mellitus Hemoglobin A1c 5.9 08/25/2022, well-controlled. -- Semglee 10 units [place] daily -- Moderate SSI for coverage -- CBGs qAC/HS  ESRD on HD -- Continue HD per nephrology  Anemia of chronic medical/renal disease Hemoglobin stable, continue intermittent monitoring.  Transfuse hemoglobin < 7.0  Clotted fistula Fistulogram showed poor outflow from fistula, nephrology consulted IR for HD catheter placement which was performed on 12/6.   DVT prophylaxis: heparin injection 5,000 Units Start: 09/12/22 1445 Place and maintain sequential compression device Start: 08/25/22 1025    Code Status: Full Code Family Communication: No family present at bedside this morning; family meeting planned for Saturday, 09/18/2022 with palliative care  Disposition Plan:  Level of care: Telemetry Medical Status is: Inpatient Remains inpatient appropriate because: Pending placement, family meeting planned for 12/9, may need to consider comfort measures with hospice if family wishes patient to return home.    Consultants:  PCCM: Signed off 12/2 Neurology: Signed off 12/4 Neurosurgery Nephrology Interventional radiology Palliative care  Procedures:  HD catheter placement, IR 12/6  Antimicrobials:  Cefepime 11/17 - 11/18 Ceftriaxone 11/19 - 11/22 Cefazolin 12/6 - 12/6   Subjective: Patient seen examined at bedside, resting comfortably.  Lying in bed.  Remains confused, in restraints, nonconversant.  No acute events overnight per nursing staff.  Family meeting planned for Saturday with palliative care..  Objective: Vitals:   09/17/22 0349 09/17/22 0526 09/17/22 0825 09/17/22 1118  BP: 127/79 126/78 113/65 101/72  Pulse: 77  75 71  Resp: '16  20 14  '$ Temp: 98.1 F (36.7 C)  99.4  F (37.4 C) 98.4 F (36.9 C)  TempSrc: Oral  Oral Oral  SpO2: 98%  98% 99%  Weight:      Height:        Intake/Output Summary (Last 24 hours) at 09/17/2022 1331 Last data filed at 09/16/2022 2000 Gross per 24 hour  Intake 200 ml  Output 2.5 ml  Net 197.5 ml    Filed Weights   09/14/22 1415 09/14/22 1754 09/16/22 1441  Weight: 74.6 kg 72.8 kg 73.4 kg    Examination:  Physical Exam: GEN: NAD, lying in bed, nonverbal makes eye contact PULM: CTAB w/o wheezes/crackles, normal respiratory effort on room air CV: RRR w/o M/G/R GI: abd soft, NTND, NABS MSK: no peripheral edema    Data Reviewed: I have personally reviewed following labs and imaging studies  CBC: Recent Labs  Lab 09/12/22 0336 09/13/22 0722 09/14/22 0650 09/15/22 0530 09/17/22 0435  WBC 14.3* 14.1* 10.9* 10.1 10.8*  HGB 9.9* 10.2* 9.5* 10.2* 10.9*  HCT 29.9* 30.3* 30.1* 32.8* 34.3*  MCV 69.1* 68.1* 70.3* 70.7* 70.4*  PLT 770* 728* 683* 614* 725*   Basic Metabolic Panel: Recent Labs  Lab 09/11/22 0640 09/12/22 0336 09/13/22 0722 09/14/22 0650 09/15/22 0530 09/16/22 0634 09/17/22 0435  NA 137 135 133* 137 137  --  130*  K 3.7 4.1 4.2 3.8 3.5  --  4.3  CL 99 96* 93* 99 98  --  89*  CO2 25 24 20* 24 26  --  26  GLUCOSE 136* 118* 114* 93 100*  --  90  BUN 60* 121* 168* 81* 59*  --  35*  CREATININE 4.68* 6.70* 9.40* 6.96* 6.68*  --  5.12*  CALCIUM 9.3 9.2 9.2 8.8* 8.9  --  9.2  MG 2.0 2.4 2.4 2.3 2.0 2.4 2.0  PHOS 3.4 4.0 5.1* 5.0*  --   --   --    GFR: Estimated Creatinine Clearance: 14.8 mL/min (A) (by C-G formula based on SCr of 5.12 mg/dL (H)). Liver Function Tests: Recent Labs  Lab 09/11/22 0640 09/12/22 0336 09/13/22 0722 09/14/22 0650 09/15/22 0530  AST  --   --   --   --  30  ALT  --   --   --   --  43  ALKPHOS  --   --   --   --  91  BILITOT  --   --   --   --  1.0  PROT  --   --   --   --  7.9  ALBUMIN 2.9* 2.8* 3.0* 3.0* 3.2*   No results for input(s): "LIPASE", "AMYLASE"  in the last 168 hours. No results for input(s): "AMMONIA" in the last 168 hours. Coagulation Profile: No results for input(s): "INR", "PROTIME" in the last 168 hours. Cardiac Enzymes: No results for input(s): "CKTOTAL", "CKMB", "CKMBINDEX", "TROPONINI" in the last 168 hours. BNP (last 3 results) No results for input(s): "PROBNP" in the last 8760 hours. HbA1C: No results for input(s): "HGBA1C" in the last 72 hours. CBG: Recent Labs  Lab 09/16/22 0537 09/16/22 1217 09/17/22 0020 09/17/22 0559 09/17/22 1223  GLUCAP 147* 154* 214* 131* 147*   Lipid Profile: Recent Labs    09/17/22 0435  TRIG 131   Thyroid Function Tests: No results for input(s): "TSH", "T4TOTAL", "FREET4", "T3FREE", "THYROIDAB" in the last 72 hours. Anemia Panel: No results for input(s): "VITAMINB12", "FOLATE", "FERRITIN", "TIBC", "IRON", "RETICCTPCT" in the last 72 hours. Sepsis Labs: No results for input(s): "PROCALCITON", "LATICACIDVEN" in the last 168 hours.  No results found for this or any previous visit (from the past 240 hour(s)).       Radiology Studies: No results found.      Scheduled Meds:  amLODipine  5 mg Per Tube BID   carvedilol  25 mg Per Tube BID WC   Chlorhexidine Gluconate Cloth  6 each Topical Q0600   Chlorhexidine Gluconate Cloth  6 each Topical Q0600   darbepoetin (ARANESP) injection - DIALYSIS  150 mcg Subcutaneous Q Mon-1800   feeding supplement (PROSource TF20)  60 mL Per Tube Daily   heparin injection (subcutaneous)  5,000 Units Subcutaneous Q8H   hydrALAZINE  100 mg Per Tube Q8H   insulin aspart  0-15 Units Subcutaneous Q6H   insulin glargine-yfgn  10 Units Subcutaneous Daily   multivitamin  1 tablet Per Tube QHS   nutrition supplement (JUVEN)  1 packet Per Tube BID BM   mouth rinse  15 mL Mouth Rinse 4 times per day   pantoprazole (PROTONIX) IV  40 mg Intravenous Q12H   QUEtiapine  25 mg Oral Once   QUEtiapine  50 mg Per NG tube BID   sevelamer carbonate  2.4 g  Per Tube TID WC   Continuous Infusions:  sodium chloride Stopped (08/30/22 0033)   feeding supplement (NEPRO CARB STEADY) 1,000 mL (09/16/22 1245)     LOS: 24 days    Time spent: 45 minutes spent on chart review, discussion with nursing staff, consultants, updating family and interview/physical exam; more than 50% of that time was spent in counseling and/or coordination of care.    Tymber Stallings J [place] DO [facility] Available via Epic secure chat 7am-7pm After these hours, please refer to coverage provider listed on [email or web address] 09/17/2022, 1:31 PM

## 2022-09-17 NOTE — Progress Notes (Signed)
Daily Progress Note   Patient Name: Randy Ross       Date: 09/17/2022 DOB: 08/19/1971  Age: 51 y.o. MRN#: [id number] Attending Physician: [place] Eric J, DO Primary Care Physician: Medicine, [place] Date: 08/24/2022  Reason for Consultation/Follow-up: Establishing goals of care  Subjective: No verbalization  Length of Stay: 24  Current Medications: Scheduled Meds:   amLODipine  5 mg Per Tube BID   carvedilol  25 mg Per Tube BID WC   Chlorhexidine Gluconate Cloth  6 each Topical Q0600   Chlorhexidine Gluconate Cloth  6 each Topical Q0600   darbepoetin (ARANESP) injection - DIALYSIS  150 mcg Subcutaneous Q Mon-1800   feeding supplement (PROSource TF20)  60 mL Per Tube Daily   heparin injection (subcutaneous)  5,000 Units Subcutaneous Q8H   hydrALAZINE  100 mg Per Tube Q8H   insulin aspart  0-15 Units Subcutaneous Q6H   insulin glargine-yfgn  10 Units Subcutaneous Daily   multivitamin  1 tablet Per Tube QHS   nutrition supplement (JUVEN)  1 packet Per Tube BID BM   mouth rinse  15 mL Mouth Rinse 4 times per day   pantoprazole (PROTONIX) IV  40 mg Intravenous Q12H   QUEtiapine  25 mg Oral Once   QUEtiapine  50 mg Per NG tube BID   sevelamer carbonate  2.4 g Per Tube TID WC    Continuous Infusions:  sodium chloride Stopped (08/30/22 0033)   feeding supplement (NEPRO CARB STEADY) 1,000 mL (09/16/22 1245)    PRN Meds: sodium chloride, acetaminophen (TYLENOL) oral liquid 160 mg/5 mL, bisacodyl, fentaNYL (SUBLIMAZE) injection, hydrALAZINE, labetalol, ondansetron (ZOFRAN) IV, mouth rinse  Physical Exam Constitutional:      General: He is not in acute distress.    Appearance: He is ill-appearing.     Comments: Follows commands intermittently today   Cardiovascular:     Rate and Rhythm: Normal rate.  Pulmonary:     Effort: Pulmonary effort is normal.  Skin:    General: Skin is warm and dry.  Neurological:     Mental Status: He is alert.             Vital Signs: BP 113/65 (BP Location: Right Arm)   Pulse 75   Temp 99.4 F (37.4 C) (Oral)   Resp 20   Ht '5\' 5"'$  (1.651 m)   Wt 73.4 kg   SpO2 98%   BMI 26.93 kg/m  SpO2: SpO2: 98 % O2 Device: O2 Device: Room Air O2 Flow Rate: O2 Flow Rate (L/min): 2 L/min  Intake/output summary:  Intake/Output Summary (Last 24 hours) at 09/17/2022 0935 Last data filed at 09/16/2022 2000 Gross per 24 hour  Intake 200 ml  Output 2.5 ml  Net 197.5 ml    LBM: Last BM Date : 09/14/22 Baseline Weight: Weight: 80.4 kg Most recent weight: Weight: 73.4 kg         Patient Active Problem List   Diagnosis Date Noted   Palliative care by specialist 09/16/2022   Cerebral edema ([place]) 69/48/5462   Acute embolic stroke ([place]) 70/35/0093   Lower GI bleeding 09/12/2022   Dysphagia 09/12/2022   Gastrointestinal hemorrhage 81/82/9937   Acute metabolic  encephalopathy and delirium 09/10/2022   Pressure injury of skin 09/01/2022   Acute respiratory failure with hypoxia ([place]) 08/25/2022   Hypertensive emergency 08/25/2022   Encounter for continuous renal replacement therapy (CRRT) for acute renal failure ([place]) 08/25/2022   Acute cerebellar hemorrhage ([place]) 08/25/2022   ESRD (end stage renal disease) ([place]) 12/15/2021   Essential hypertension 12/15/2021   Diabetes mellitus ([place]) 12/15/2021   CKD (chronic kidney disease), stage IV ([place]) 12/08/2021   AKI (acute kidney injury) ([place]) 12/08/2021   MGUS (monoclonal gammopathy of unknown significance) 08/06/2019   Anemia 08/06/2019    Palliative Care Assessment & Plan   HPI: 51 y.o. male  with past medical history of Anemia, ESRD on HD, diabetes, and hypertension admitted on 08/24/2022 with acute IPH with severe mass effect/brain swelling and  hypertensive emergency.  Patient also with inability to protect airway due to mental status.  PMT consulted to discuss goals of care.   Per family patient goes by "Randy Ross"  Assessment: Visited patient at bedside - no family present.  Today Randy Ross is sleeping when I walk in but opens eyes when I say his name. Seems a bit drowsy.He does reach for my hand and squeeze when I hold it out to him. I tell him to wiggle his toes and he does not but then when I point to toes he wiggles them. I ask him to blink his eyes and he does not. No verbalization during visit.   Per chart review family has told TOC they want to take patient home.  Family meeting planned for tomorrow 12/9 at 12 pm. Will have [place] interpreter available. Will plan to discuss whether or not to proceed with PEG and whether or not hospice involvement is in line with goals.   Recommendations/Plan: Family meeting scheduled Saturday at 12 pm Continue full code/full scope treatment  Goals of Care and Additional Recommendations: Limitations on Scope of Treatment: Full Scope Treatment  Code Status: Full code  Discharge Planning: To Be Determined  Thank you for allowing the Palliative Medicine Team to assist in the care of this patient.   *Please note that this is a verbal dictation therefore any spelling or grammatical errors are due to the "[place] [employer]" system interpretation.  Juel Burrow, DNP, [facility] Palliative Medicine Team Team Phone # [phone]  Pager [phone]

## 2022-09-18 DIAGNOSIS — I614 Nontraumatic intracerebral hemorrhage in cerebellum: Secondary | ICD-10-CM | POA: Diagnosis not present

## 2022-09-18 DIAGNOSIS — Z7189 Other specified counseling: Secondary | ICD-10-CM | POA: Diagnosis not present

## 2022-09-18 DIAGNOSIS — Z515 Encounter for palliative care: Secondary | ICD-10-CM | POA: Diagnosis not present

## 2022-09-18 LAB — MAGNESIUM: Magnesium: 2.9 mg/dL — ABNORMAL HIGH (ref 1.7–2.4)

## 2022-09-18 LAB — GLUCOSE, CAPILLARY: Glucose-Capillary: 148 mg/dL — ABNORMAL HIGH (ref 70–99)

## 2022-09-18 MED ORDER — QUETIAPINE FUMARATE 50 MG PO TABS
50.0000 mg | ORAL_TABLET | Freq: Once | ORAL | Status: AC
  Administered 2022-09-18: 50 mg
  Filled 2022-09-18: qty 1

## 2022-09-18 MED ORDER — HEPARIN SODIUM (PORCINE) 1000 UNIT/ML IJ SOLN
INTRAMUSCULAR | Status: AC
  Administered 2022-09-18: 3200 [IU]
  Filled 2022-09-18: qty 4

## 2022-09-18 NOTE — Progress Notes (Signed)
Daily Progress Note   Patient Name: Randy Ross       Date: 09/18/2022 DOB: 01/27/1971  Age: 51 y.o. MRN#: [id number] Attending Physician: [place] Eric J, DO Primary Care Physician: Medicine, [place] Date: 08/24/2022  Reason for Consultation/Follow-up: Establishing goals of care  Subjective: No verbalization  Length of Stay: 25  Current Medications: Scheduled Meds:   amLODipine  5 mg Per Tube BID   carvedilol  25 mg Per Tube BID WC   Chlorhexidine Gluconate Cloth  6 each Topical Q0600   Chlorhexidine Gluconate Cloth  6 each Topical Q0600   darbepoetin (ARANESP) injection - DIALYSIS  150 mcg Subcutaneous Q Mon-1800   feeding supplement (PROSource TF20)  60 mL Per Tube Daily   heparin injection (subcutaneous)  5,000 Units Subcutaneous Q8H   hydrALAZINE  100 mg Per Tube Q8H   insulin aspart  0-15 Units Subcutaneous Q6H   insulin glargine-yfgn  10 Units Subcutaneous Daily   multivitamin  1 tablet Per Tube QHS   nutrition supplement (JUVEN)  1 packet Per Tube BID BM   mouth rinse  15 mL Mouth Rinse 4 times per day   pantoprazole (PROTONIX) IV  40 mg Intravenous Q12H   QUEtiapine  25 mg Oral Once   QUEtiapine  50 mg Per NG tube BID   sevelamer carbonate  2.4 g Per Tube TID WC    Continuous Infusions:  sodium chloride Stopped (08/30/22 0033)   feeding supplement (NEPRO CARB STEADY) 1,000 mL (09/16/22 1245)    PRN Meds: sodium chloride, acetaminophen (TYLENOL) oral liquid 160 mg/5 mL, bisacodyl, fentaNYL (SUBLIMAZE) injection, hydrALAZINE, labetalol, LORazepam, ondansetron (ZOFRAN) IV, mouth rinse  Physical Exam Constitutional:      General: He is not in acute distress.    Appearance: He is ill-appearing.     Comments: Follows commands intermittently  today  Cardiovascular:     Rate and Rhythm: Normal rate.  Pulmonary:     Effort: Pulmonary effort is normal.  Skin:    General: Skin is warm and dry.  Neurological:     Mental Status: He is alert.             Vital Signs: BP 104/65 (BP Location: Left Arm)   Pulse 77   Temp 99.5 F (37.5 C) (Axillary)   Resp 16   Ht '5\' 5"'$  (1.651 m)   Wt 73.4 kg   SpO2 99%   BMI 26.93 kg/m  SpO2: SpO2: 99 % O2 Device: O2 Device: Room Air O2 Flow Rate: O2 Flow Rate (L/min): 2 L/min  Intake/output summary:  Intake/Output Summary (Last 24 hours) at 09/18/2022 1350 Last data filed at 09/17/2022 2010 Gross per 24 hour  Intake --  Output 0 ml  Net 0 ml    LBM: Last BM Date : 09/17/22 Baseline Weight: Weight: 80.4 kg Most recent weight: Weight: 73.4 kg         Patient Active Problem List   Diagnosis Date Noted   Palliative care by specialist 09/16/2022   Cerebral edema ([place]) 26/20/3559   Acute embolic stroke ([place]) 74/16/3845   Lower GI bleeding 09/12/2022   Dysphagia 09/12/2022   Gastrointestinal hemorrhage 36/46/8032   Acute metabolic  encephalopathy and delirium 09/10/2022   Pressure injury of skin 09/01/2022   Acute respiratory failure with hypoxia ([place]) 08/25/2022   Hypertensive emergency 08/25/2022   Encounter for continuous renal replacement therapy (CRRT) for acute renal failure ([place]) 08/25/2022   Acute cerebellar hemorrhage ([place]) 08/25/2022   ESRD (end stage renal disease) ([place]) 12/15/2021   Essential hypertension 12/15/2021   Diabetes mellitus ([place]) 12/15/2021   CKD (chronic kidney disease), stage IV ([place]) 12/08/2021   AKI (acute kidney injury) ([place]) 12/08/2021   MGUS (monoclonal gammopathy of unknown significance) 08/06/2019   Anemia 08/06/2019    Palliative Care Assessment & Plan   HPI: 51 y.o. male  with past medical history of Anemia, ESRD on HD, diabetes, and hypertension admitted on 08/24/2022 with acute IPH with severe mass effect/brain swelling and hypertensive  emergency.  Patient also with inability to protect airway due to mental status.  PMT consulted to discuss goals of care.   Per family patient goes by "Randy Ross"  Assessment: Family meeting today to include multiple family members including mom, son, multiple brothers and sisters, and Dr. [place]  Interpreter services provided for patient's mother.  We reviewed Randy Ross's medical condition and hospital course to this point.  We reviewed concerns about what Randy Ross's quality of life will be and if he would find new baseline acceptable.  We discussed continuing aggressive medical care versus transition to focus on his comfort.  We discussed that they desire to move forward with aggressive medical care we will need to proceed with PEG tube placement.  We discussed that if we transition to focus on his comfort we would also stop dialysis and temperature we would be short.  Family request time to discuss amongst themselves and agreed for [place] to reach out to them tomorrow.  All questions and concerns addressed.  Recommendations/Plan: Family meeting to discuss continuing aggressive medical care versus focusing on comfort -allowing time for them to discuss amongst themselves PMT will continue to follow  Goals of Care and Additional Recommendations: Limitations on Scope of Treatment: Full Scope Treatment  Code Status: Full code  Discharge Planning: To Be Determined  Thank you for allowing the Palliative Medicine Team to assist in the care of this patient.   *Please note that this is a verbal dictation therefore any spelling or grammatical errors are due to the "[place] [employer]" system interpretation.  Juel Burrow, DNP, [facility] Palliative Medicine Team Team Phone # [phone]  Pager [phone]

## 2022-09-18 NOTE — Progress Notes (Signed)
PROGRESS NOTE    Randy Ross  RDE:[id number] DOB: 01/26/1971 DOA: 08/24/2022 PCP: Medicine, [place] [facility]    Brief Narrative:   Randy Ross is a 51 y.o. male with past medical history significant for ESRD on HD, DM2, essential hypertension who presented to [facility] ED with sudden onset slurred speech, headache.  Workup in the ER with elevated BP of 260/140 and CT head showing cerebellar hemorrhage.  Patient was initially intubated in the ED for airway protection and admitted to the PCCM/neurology service.  Significant Hospital events: 11/14: Admitted for acute cerebellar hemorrhage; intubated in the ER, started on hypertonic saline 11/15: Neurosurgery were consulted, recommended against surgical evacuation 11/16: Cefepime started for suspected aspiration 11/17: Required 1u PRBC transfusion due to CRRT filter 11/19: EEG nonspecific 11/21: MRI brain shows new scattered bilateral acute infarcts 11/22: Completed 7 days antibiotics 11/24: IR consulted given recirculation syndrome, high BUN; plan for fistulogram pending 11/27: Temp cath HD cath placed  11/30: Self-extubated 12/1: Persistent delirium 12/2: Transferred out of ICU to [facility] service 12/3: Pulled out cortrak 12/4: Failed SLP, needs cortrak, fistulogram shows poor outflow 12/5: TDC placed, feeding tube replaced 12/6: HD catheter placed by IR; palliative care scheduled family meeting for 12/9   Assessment & Plan:   Acute Cerebellar hemorrhage 2/2 poorly controlled hypertension Cerebral edema Patient presenting to ED via EMS with mental status change, left-sided weakness, slurred speech and left-sided gaze preference.  Patient was noted to be severely hypertensive on admission.  Patient was subsequently intubated for airway protection and CT notable for IPH right cerebellar hemisphere with mass effect.  Patient was started on Cleviprex, Versed and admitted under the critical care/neurology service.   Patient was seen by neurosurgery who did not recommend surgical intervention; and patient was started on hypertonic saline and placed on CRRT to minimize rapid fluid shifts. -- Continues with poor progression in terms of mental status, repeat CT head x 3 remained stable overall very poor prognosis and palliative care following.  Neurology now signed off.  And CRRT converted back to HD per nephrology.  Family meeting planned for 12/9; anticipate need for transition to comfort measures.  Dysphagia Etiology low please secondary to persistent encephalopathy, patient has pulled out core track [name], replaced today.  Palliative care following, plan for family meeting on 12/9 and if no progression towards oral intake and family still wanting aggressive measures will need PEG tube placed. -- Continue tube feeds via core track until further family discussion -- Aspiration precautions  Lower GI bleeding: Resolved Noted by nurse, transient, no change in hemoglobin.  No further bleeding reported. 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  TTE with LVEF 50-55%.  LDL 91, hemoglobin A1c 5.9.  Holding antiplatelet for anemia, cerebellar hemorrhage.  Neurology now signed off; and do not recommend further evaluation for cardiac source of embolism as he is not a good long-term anticoagulation candidate.  Hypertensive emergency Hx essential hypertension BP severely elevated on admission, likely leading to acute cerebellar hemorrhage. -- Amlodipine 5 mg per tube twice daily -- Carvedilol 25 mg per tube twice daily -- Hydralazine 100 mg per tube every 8 hours  Acute respiratory failure with hypoxia: Resolved Aspiration pneumonia Patient was initially intubated for airway protection upon ED arrival, patient self-extubated on 11/30.  Completed course of  antibiotics with cefepime followed by ceftriaxone.  Acute metabolic encephalopathy  Patient continues to not be able to follow commands, likely secondary to brain insult. --Supportive care, Seroquel  Type 2 diabetes mellitus Hemoglobin A1c 5.9 08/25/2022, well-controlled. -- Semglee 10 units [place] daily -- Moderate SSI for coverage -- CBGs qAC/HS  ESRD on HD -- Continue HD per nephrology  Anemia of chronic medical/renal disease Hemoglobin stable, continue intermittent monitoring.  Transfuse hemoglobin < 7.0  Clotted fistula Fistulogram showed poor outflow from fistula, nephrology consulted IR for HD catheter placement which was performed on 12/6.   DVT prophylaxis: heparin injection 5,000 Units Start: 09/12/22 1445 Place and maintain sequential compression device Start: 08/25/22 1025    Code Status: Full Code Family Communication: Girlfriend present at bedside this morning; family meeting planned today with palliative care  Disposition Plan:  Level of care: Telemetry Medical Status is: Inpatient Remains inpatient appropriate because: Pending placement, family meeting planned for today, may need to consider comfort measures with hospice if family wishes patient to return home.    Consultants:  PCCM: Signed off 12/2 Neurology: Signed off 12/4 Neurosurgery Nephrology Interventional radiology Palliative care  Procedures:  HD catheter placement, IR 12/6  Antimicrobials:  Cefepime 11/17 - 11/18 Ceftriaxone 11/19 - 11/22 Cefazolin 12/6 - 12/6   Subjective: Patient seen examined at bedside, resting comfortably.  Lying in bed.  Remains confused, in restraints, nonconversant.  Girlfriend present, states that he would not like to live this way.  Family meeting planned this afternoon with palliative care.  No acute events overnight per nursing staff.    Objective: Vitals:   09/17/22 2344 09/18/22 0406 09/18/22 0818 09/18/22 1126  BP: (!) 102/59 108/63 120/71 104/65  Pulse:  80 72 78 77  Resp: '20 17 14 16  '$ Temp: 98.2 F (36.8 C) 98.1 F (36.7 C) 98.3 F (36.8 C) 99.5 F (37.5 C)  TempSrc: Oral Oral Oral Axillary  SpO2: 100% 100% 100% 99%  Weight:      Height:        Intake/Output Summary (Last 24 hours) at 09/18/2022 1149 Last data filed at 09/17/2022 2010 Gross per 24 hour  Intake --  Output 0 ml  Net 0 ml    Filed Weights   09/14/22 1415 09/14/22 1754 09/16/22 1441  Weight: 74.6 kg 72.8 kg 73.4 kg    Examination:  Physical Exam: GEN: NAD, lying in bed, nonverbal makes eye contact PULM: CTAB w/o wheezes/crackles, normal respiratory effort on room air CV: RRR w/o M/G/R GI: abd soft, NTND, NABS MSK: no peripheral edema    Data Reviewed: I have personally reviewed following labs and imaging studies  CBC: Recent Labs  Lab 09/12/22 0336 09/13/22 0722 09/14/22 0650 09/15/22 0530 09/17/22 0435  WBC 14.3* 14.1* 10.9* 10.1 10.8*  HGB 9.9* 10.2* 9.5* 10.2* 10.9*  HCT 29.9* 30.3* 30.1* 32.8* 34.3*  MCV 69.1* 68.1* 70.3* 70.7* 70.4*  PLT 770* 728* 683* 614* 324*   Basic Metabolic Panel: Recent Labs  Lab 09/12/22 0336 09/13/22 0722 09/14/22 0650 09/15/22 0530 09/16/22 0634 09/17/22 0435 09/18/22 0251  NA 135 133* 137 137  --  130*  --   K 4.1 4.2 3.8 3.5  --  4.3  --   CL 96* 93* 99 98  --  89*  --   CO2 24 20* 24 26  --  26  --   GLUCOSE 118* 114* 93 100*  --  90  --   BUN 121* 168* 81* 59*  --  35*  --   CREATININE 6.70* 9.40* 6.96* 6.68*  --  5.12*  --   CALCIUM 9.2 9.2 8.8* 8.9  --  9.2  --   MG 2.4 2.4 2.3 2.0 2.4 2.0 2.9*  PHOS 4.0 5.1* 5.0*  --   --   --   --    GFR: Estimated Creatinine Clearance: 14.8 mL/min (A) (by C-G formula based on SCr of 5.12 mg/dL (H)). Liver Function Tests: Recent Labs  Lab 09/12/22 0336 09/13/22 0722 09/14/22 0650 09/15/22 0530  AST  --   --   --  30  ALT  --   --   --  43  ALKPHOS  --   --   --  91  BILITOT  --   --   --  1.0  PROT  --   --   --  7.9  ALBUMIN 2.8* 3.0* 3.0* 3.2*    No results for input(s): "LIPASE", "AMYLASE" in the last 168 hours. No results for input(s): "AMMONIA" in the last 168 hours. Coagulation Profile: No results for input(s): "INR", "PROTIME" in the last 168 hours. Cardiac Enzymes: No results for input(s): "CKTOTAL", "CKMB", "CKMBINDEX", "TROPONINI" in the last 168 hours. BNP (last 3 results) No results for input(s): "PROBNP" in the last 8760 hours. HbA1C: No results for input(s): "HGBA1C" in the last 72 hours. CBG: Recent Labs  Lab 09/16/22 1217 09/17/22 0020 09/17/22 0559 09/17/22 1223 09/18/22 0013  GLUCAP 154* 214* 131* 147* 148*   Lipid Profile: Recent Labs    09/17/22 0435  TRIG 131   Thyroid Function Tests: No results for input(s): "TSH", "T4TOTAL", "FREET4", "T3FREE", "THYROIDAB" in the last 72 hours. Anemia Panel: No results for input(s): "VITAMINB12", "FOLATE", "FERRITIN", "TIBC", "IRON", "RETICCTPCT" in the last 72 hours. Sepsis Labs: No results for input(s): "PROCALCITON", "LATICACIDVEN" in the last 168 hours.  No results found for this or any previous visit (from the past 240 hour(s)).       Radiology Studies: No results found.      Scheduled Meds:  amLODipine  5 mg Per Tube BID   carvedilol  25 mg Per Tube BID WC   Chlorhexidine Gluconate Cloth  6 each Topical Q0600   Chlorhexidine Gluconate Cloth  6 each Topical Q0600   darbepoetin (ARANESP) injection - DIALYSIS  150 mcg Subcutaneous Q Mon-1800   feeding supplement (PROSource TF20)  60 mL Per Tube Daily   heparin injection (subcutaneous)  5,000 Units Subcutaneous Q8H   hydrALAZINE  100 mg Per Tube Q8H   insulin aspart  0-15 Units Subcutaneous Q6H   insulin glargine-yfgn  10 Units Subcutaneous Daily   multivitamin  1 tablet Per Tube QHS   nutrition supplement (JUVEN)  1 packet Per Tube BID BM   mouth rinse  15 mL Mouth Rinse 4 times per day   pantoprazole (PROTONIX) IV  40 mg Intravenous Q12H   QUEtiapine  25 mg Oral Once   QUEtiapine  50  mg Per NG tube BID   sevelamer carbonate  2.4 g Per Tube TID WC   Continuous Infusions:  sodium chloride Stopped (08/30/22 0033)   feeding supplement (NEPRO CARB STEADY) 1,000 mL (09/16/22 1245)     LOS: 25 days    Time spent: 45 minutes spent on chart review, discussion with nursing staff, consultants, updating family and interview/physical exam; more than 50% of that time was spent in counseling and/or coordination of care.    Annsleigh Dragoo J [place] DO [facility] Available via Epic secure chat 7am-7pm After these hours, please refer to coverage provider listed on  [email or web address] 09/18/2022, 11:49 AM

## 2022-09-18 NOTE — Progress Notes (Signed)
Received patient in bed to unit.  Alert and oriented.  Informed consent signed and in chart.   Treatment initiated: 0352 Treatment completed: 1826  Patient tolerated well.  Transported back to the room  Alert, without acute distress.  Hand-off given to patient's nurse.   Access used: Catheter Access issues: none  Total UF removed: 1300 Medication(s) given: none Post HD VS: 98.1, 143/93,HR-88, RR-18, Sp02-99 Post HD weight: 71.6kg   [facility]

## 2022-09-18 NOTE — Progress Notes (Signed)
Randy Ross Progress Note   Subjective:   seen in room - remains non communicative and in restraints. TDC in place.    Pall care following and a family meeting is set for noon today. Girlfriend bedside this AM - says he wouldn't want to live like this if no expected improvement  Objective Vitals:   09/17/22 2010 09/17/22 2344 09/18/22 0406 09/18/22 0818  BP: (!) 158/140 (!) 102/59 108/63 120/71  Pulse: 70 80 72 78  Resp: '20 20 17 14  '$ Temp: 97.7 F (36.5 C) 98.2 F (36.8 C) 98.1 F (36.7 C) 98.3 F (36.8 C)  TempSrc: Oral Oral Oral Oral  SpO2: 100% 100% 100% 100%  Weight:      Height:       Physical Exam General: awake but not interactive, NG tube in  Heart: RRR, no murmurs, rubs or gallops Lungs: CTA anteriorly Abdomen: Soft, non-distended, +BS Extremities: no edema b/l lower extremties Neuro: not following commands today Dialysis Access:  LUE AVF + bruit, R IJ Tunneled  HD catheter c/d/i    Dialysis Orders: SW MWF 3h 53mn  80kg   400/800   2/2 bath  Hep 2000  LFA AVF - last HD 11/13, post 80.1kg - mircera 150 q2, last 11/13 - hectorol 461m IV q HD  Assessment/Plan: 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 S/P course of hypertonic fluids.  Mental status remains poor.  HTN/ vol  - getting amlodipine/ coreg/amlodipine per tube, also has PRNs ordered. No edema on exam, BP's variable. Appears euvolemic now. AMS - due to CVA/ bleed and now with delerium and ongoing MS changes. Appears this is his new baseline.   ESRD - pt required CRRT 11/15-11/17/23. Last HD 12/5 - was getting MWF but off schedule.  For now plan next HD 12/9 but later in day as family meeting is at noon - if GOC change would hold tx.   HD access - BUN was very high so access recirc was suspected. Using temp HD cath placed 11/27 by CCM and BUN better. IR consulted for fistulogram - done 12/4 and noted to be small with competing veins.  [facility] now [facility]  [facility]  Anemia of ESRD: Hgb 9s, continue Aranesp 15042mweekly while here. Secondary HPTH: CCa ok. renvela per NG tube now at goal. Continue VDRA.  DM2 - per pmd  GOC - full code - unclear where he'll go from here - if this is mainly delerium or his baseline status now.  Requiring restraints and not interacting.  Cortrak inserted for nutrition after failed swallow study and pulled out NG tube.   If this is his expected baseline long term I think it's really reasonable to consider hospice care -- pall care following.  Family meeting today noon.  SOunds like family wants him home - the only way I see that happening would be with hospice otherwise its a facility.   LinJannifer Hick [facility] Pager [phone] Labs  Lab 09/13/22 [id number]/05/23 0650 09/15/22 0530 09/17/22 0435  HGB 10.2* 9.5* 10.2* 10.9*  ALBUMIN 3.0* 3.0* 3.2*  --   CALCIUM 9.2 8.8* 8.9 9.2  PHOS 5.1* 5.0*  --   --   CREATININE 9.40* 6.96* 6.68* 5.12*  K 4.2 3.8 3.5 4.3     Inpatient medications:  amLODipine  5 mg Per Tube BID   carvedilol  25 mg Per Tube BID WC   Chlorhexidine Gluconate  Cloth  6 each Topical Q0600   Chlorhexidine Gluconate Cloth  6 each Topical Q0600   darbepoetin (ARANESP) injection - DIALYSIS  150 mcg Subcutaneous Q Mon-1800   feeding supplement (PROSource TF20)  60 mL Per Tube Daily   heparin injection (subcutaneous)  5,000 Units Subcutaneous Q8H   hydrALAZINE  100 mg Per Tube Q8H   insulin aspart  0-15 Units Subcutaneous Q6H   insulin glargine-yfgn  10 Units Subcutaneous Daily   multivitamin  1 tablet Per Tube QHS   nutrition supplement (JUVEN)  1 packet Per Tube BID BM   mouth rinse  15 mL Mouth Rinse 4 times per day   pantoprazole (PROTONIX) IV  40 mg Intravenous Q12H   QUEtiapine  25 mg Oral Once   QUEtiapine  50 mg Per NG tube BID   sevelamer carbonate  2.4 g Per Tube TID WC    sodium chloride Stopped (08/30/22 0033)   feeding supplement (NEPRO CARB  STEADY) 1,000 mL (09/16/22 1245)   sodium chloride, acetaminophen (TYLENOL) oral liquid 160 mg/5 mL, bisacodyl, fentaNYL (SUBLIMAZE) injection, hydrALAZINE, labetalol, LORazepam, ondansetron (ZOFRAN) IV, mouth rinse

## 2022-09-19 DIAGNOSIS — I614 Nontraumatic intracerebral hemorrhage in cerebellum: Secondary | ICD-10-CM | POA: Diagnosis not present

## 2022-09-19 LAB — GLUCOSE, CAPILLARY
Glucose-Capillary: 144 mg/dL — ABNORMAL HIGH (ref 70–99)
Glucose-Capillary: 151 mg/dL — ABNORMAL HIGH (ref 70–99)
Glucose-Capillary: 184 mg/dL — ABNORMAL HIGH (ref 70–99)
Glucose-Capillary: 192 mg/dL — ABNORMAL HIGH (ref 70–99)

## 2022-09-19 NOTE — Plan of Care (Signed)
  Problem: Activity: Goal: Ability to tolerate increased activity will improve Outcome: Progressing   Problem: Respiratory: Goal: Ability to maintain a clear airway and adequate ventilation will improve Outcome: Progressing   Problem: Role Relationship: Goal: Method of communication will improve Outcome: Progressing   Problem: Education: Goal: Ability to describe self-care measures that may prevent or decrease complications (Diabetes Survival Skills Education) will improve Outcome: Progressing Goal: Individualized Educational Video(s) Outcome: Progressing   Problem: Coping: Goal: Ability to adjust to condition or change in health will improve Outcome: Progressing   Problem: Fluid Volume: Goal: Ability to maintain a balanced intake and output will improve Outcome: Progressing   Problem: Health Behavior/Discharge Planning: Goal: Ability to identify and utilize available resources and services will improve Outcome: Progressing Goal: Ability to manage health-related needs will improve Outcome: Progressing   Problem: Metabolic: Goal: Ability to maintain appropriate glucose levels will improve Outcome: Progressing   Problem: Nutritional: Goal: Maintenance of adequate nutrition will improve Outcome: Progressing Goal: Progress toward achieving an optimal weight will improve Outcome: Progressing   Problem: Skin Integrity: Goal: Risk for impaired skin integrity will decrease Outcome: Progressing   Problem: Tissue Perfusion: Goal: Adequacy of tissue perfusion will improve Outcome: Progressing   Problem: Safety: Goal: Non-violent Restraint(s) Outcome: Progressing   Problem: Education: Goal: Knowledge of General Education information will improve Description: Including pain rating scale, medication(s)/side effects and non-pharmacologic comfort measures Outcome: Progressing   Problem: Health Behavior/Discharge Planning: Goal: Ability to manage health-related needs will  improve Outcome: Progressing   Problem: Clinical Measurements: Goal: Ability to maintain clinical measurements within normal limits will improve Outcome: Progressing Goal: Will remain free from infection Outcome: Progressing Goal: Diagnostic test results will improve Outcome: Progressing Goal: Respiratory complications will improve Outcome: Progressing Goal: Cardiovascular complication will be avoided Outcome: Progressing   Problem: Activity: Goal: Risk for activity intolerance will decrease Outcome: Progressing   Problem: Nutrition: Goal: Adequate nutrition will be maintained Outcome: Progressing   Problem: Coping: Goal: Level of anxiety will decrease Outcome: Progressing   Problem: Elimination: Goal: Will not experience complications related to bowel motility Outcome: Progressing Goal: Will not experience complications related to urinary retention Outcome: Progressing   Problem: Pain Managment: Goal: General experience of comfort will improve Outcome: Progressing   Problem: Safety: Goal: Ability to remain free from injury will improve Outcome: Progressing   Problem: Skin Integrity: Goal: Risk for impaired skin integrity will decrease Outcome: Progressing

## 2022-09-19 NOTE — Progress Notes (Signed)
PROGRESS NOTE    Randy Ross  SNK:[id number] DOB: 01-08-1971 DOA: 08/24/2022 PCP: Medicine, [facility]    Brief Narrative:   Randy Ross is a 51 y.o. male with past medical history significant for ESRD on HD, DM2, essential hypertension who presented to [facility] ED with sudden onset slurred speech, headache.  Workup in the ER with elevated BP of 260/140 and CT head showing cerebellar hemorrhage.  Patient was initially intubated in the ED for airway protection and admitted to the PCCM/neurology service.  Significant Hospital events: 11/14: Admitted for acute cerebellar hemorrhage; intubated in the ER, started on hypertonic saline 11/15: Neurosurgery were consulted, recommended against surgical evacuation 11/16: Cefepime started for suspected aspiration 11/17: Required 1u PRBC transfusion due to CRRT filter 11/19: EEG nonspecific 11/21: MRI brain shows new scattered bilateral acute infarcts 11/22: Completed 7 days antibiotics 11/24: IR consulted given recirculation syndrome, high BUN; plan for fistulogram pending 11/27: Temp cath HD cath placed  11/30: Self-extubated 12/1: Persistent delirium 12/2: Transferred out of ICU to [facility] service 12/3: Pulled out cortrak 12/4: Failed SLP, needs cortrak, fistulogram shows poor outflow 12/5: TDC placed, feeding tube replaced 12/6: HD catheter placed by IR 12/9: Family meeting with palliative care, family to decide on possible transitioning to comfort measures this weekend   Assessment & Plan:   Acute Cerebellar hemorrhage 2/2 poorly controlled hypertension Cerebral edema Patient presenting to ED via EMS with mental status change, left-sided weakness, slurred speech and left-sided gaze preference.  Patient was noted to be severely hypertensive on admission.  Patient was subsequently intubated for airway protection and CT notable for IPH right cerebellar hemisphere with mass effect.  Patient was started on Cleviprex,  Versed and admitted under the critical care/neurology service.  Patient was seen by neurosurgery who did not recommend surgical intervention; and patient was started on hypertonic saline and placed on CRRT to minimize rapid fluid shifts. -- Continues with poor progression in terms of mental status, repeat CT head x 3 remained stable overall very poor prognosis and palliative care following.  Neurology now signed off.  And CRRT converted back to HD per nephrology.  Family meeting planned for 12/9; family now to discuss amongst themselves whether transitioning to comfort measures versus need for PEG tube and SNF placement.  Dysphagia Etiology low please secondary to persistent encephalopathy, patient has pulled out core track [name], replaced today.  Palliative care following, plan for family meeting on 12/9 and if no progression towards oral intake and family still wanting aggressive measures will need PEG tube placed. -- Continue tube feeds via core track until further family discussion -- Aspiration precautions  Lower GI bleeding: Resolved Noted by nurse, transient, no change in hemoglobin.  No further bleeding reported. 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  TTE with LVEF 50-55%.  LDL 91, hemoglobin A1c 5.9.  Holding antiplatelet for anemia, cerebellar hemorrhage.  Neurology now signed off; and do not recommend further evaluation for cardiac source of embolism as he is not a good long-term anticoagulation candidate.  Hypertensive emergency Hx essential hypertension BP severely elevated on admission, likely leading to acute cerebellar hemorrhage. -- Amlodipine 5 mg per tube twice daily -- Carvedilol 25 mg per tube twice daily -- Hydralazine 100 mg per tube every 8 hours  Acute respiratory failure with hypoxia: Resolved Aspiration  pneumonia Patient was initially intubated for airway  protection upon ED arrival, patient self-extubated on 11/30.  Completed course of antibiotics with cefepime followed by ceftriaxone.  Acute metabolic encephalopathy Patient continues to not be able to follow commands, likely secondary to brain insult. --Supportive care, Seroquel  Type 2 diabetes mellitus Hemoglobin A1c 5.9 08/25/2022, well-controlled. -- Semglee 10 units Brillion daily -- Moderate SSI for coverage -- CBGs qAC/HS  ESRD on HD -- Continue HD per nephrology  Anemia of chronic medical/renal disease Hemoglobin stable, continue intermittent monitoring.  Transfuse hemoglobin < 7.0  Clotted fistula Fistulogram showed poor outflow from fistula, nephrology consulted IR for HD catheter placement which was performed on 12/6.   DVT prophylaxis: heparin injection 5,000 Units Start: 09/12/22 1445 Place and maintain sequential compression device Start: 08/25/22 1025    Code Status: Full Code Family Communication: No family present at bedside this morning, long family discussion with palliative care and multiple family members yesterday  Disposition Plan:  Level of care: Telemetry Medical Status is: Inpatient Remains inpatient appropriate because: Pending placement, either PEG needed with SNF placement versus transitioning to comfort measures.    Consultants:  PCCM: Signed off 12/2 Neurology: Signed off 12/4 Neurosurgery Nephrology Interventional radiology Palliative care  Procedures:  HD catheter placement, IR 12/6  Antimicrobials:  Cefepime 11/17 - 11/18 Ceftriaxone 11/19 - 11/22 Cefazolin 12/6 - 12/6   Subjective: Patient seen examined at bedside, resting comfortably.  Lying in bed.  No family present.  RN present.  Remains confused, in restraints, nonconversant.  Extensive family meeting yesterday.  No acute events overnight per nursing staff.    Objective: Vitals:   09/18/22 1930 09/18/22 2344 09/19/22 0339  09/19/22 0848  BP: 123/72 99/66 113/61 114/75  Pulse: 76 80 80 74  Resp: '20 16 18 19  '$ Temp: 97.8 F (36.6 C) (!) 97.5 F (36.4 C) 98.1 F (36.7 C) 98.2 F (36.8 C)  TempSrc: Axillary  Oral Axillary  SpO2: 98% 97% 95% 96%  Weight:      Height:        Intake/Output Summary (Last 24 hours) at 09/19/2022 1039 Last data filed at 09/19/2022 0400 Gross per 24 hour  Intake 400 ml  Output 1300 ml  Net -900 ml    Filed Weights   09/16/22 1441 09/18/22 1430 09/18/22 1834  Weight: 73.4 kg 78.2 kg 71.6 kg    Examination:  Physical Exam: GEN: NAD, lying in bed, nonverbal makes eye contact PULM: CTAB w/o wheezes/crackles, normal respiratory effort on room air CV: RRR w/o M/G/R GI: abd soft, NTND, NABS MSK: no peripheral edema    Data Reviewed: I have personally reviewed following labs and imaging studies  CBC: Recent Labs  Lab 09/13/22 0722 09/14/22 0650 09/15/22 0530 09/17/22 0435  WBC 14.1* 10.9* 10.1 10.8*  HGB 10.2* 9.5* 10.2* 10.9*  HCT 30.3* 30.1* 32.8* 34.3*  MCV 68.1* 70.3* 70.7* 70.4*  PLT 728* 683* 614* 448*   Basic Metabolic Panel: Recent Labs  Lab 09/13/22 0722 09/14/22 0650 09/15/22 0530 09/16/22 0634 09/17/22 0435 09/18/22 0251  NA 133* 137 137  --  130*  --   K 4.2 3.8 3.5  --  4.3  --   CL 93* 99 98  --  89*  --   CO2 20* 24 26  --  26  --   GLUCOSE 114* 93 100*  --  90  --   BUN 168* 81* 59*  --  35*  --   CREATININE 9.40* 6.96* 6.68*  --  5.12*  --  CALCIUM 9.2 8.8* 8.9  --  9.2  --   MG 2.4 2.3 2.0 2.4 2.0 2.9*  PHOS 5.1* 5.0*  --   --   --   --    GFR: Estimated Creatinine Clearance: 14.8 mL/min (A) (by C-G formula based on SCr of 5.12 mg/dL (H)). Liver Function Tests: Recent Labs  Lab 09/13/22 0722 09/14/22 0650 09/15/22 0530  AST  --   --  30  ALT  --   --  43  ALKPHOS  --   --  91  BILITOT  --   --  1.0  PROT  --   --  7.9  ALBUMIN 3.0* 3.0* 3.2*   No results for input(s): "LIPASE", "AMYLASE" in the last 168 hours. No  results for input(s): "AMMONIA" in the last 168 hours. Coagulation Profile: No results for input(s): "INR", "PROTIME" in the last 168 hours. Cardiac Enzymes: No results for input(s): "CKTOTAL", "CKMB", "CKMBINDEX", "TROPONINI" in the last 168 hours. BNP (last 3 results) No results for input(s): "PROBNP" in the last 8760 hours. HbA1C: No results for input(s): "HGBA1C" in the last 72 hours. CBG: Recent Labs  Lab 09/17/22 0020 09/17/22 0559 09/17/22 1223 09/18/22 0013 09/19/22 0031  GLUCAP 214* 131* 147* 148* 192*   Lipid Profile: Recent Labs    09/17/22 0435  TRIG 131   Thyroid Function Tests: No results for input(s): "TSH", "T4TOTAL", "FREET4", "T3FREE", "THYROIDAB" in the last 72 hours. Anemia Panel: No results for input(s): "VITAMINB12", "FOLATE", "FERRITIN", "TIBC", "IRON", "RETICCTPCT" in the last 72 hours. Sepsis Labs: No results for input(s): "PROCALCITON", "LATICACIDVEN" in the last 168 hours.  No results found for this or any previous visit (from the past 240 hour(s)).       Radiology Studies: No results found.      Scheduled Meds:  amLODipine  5 mg Per Tube BID   carvedilol  25 mg Per Tube BID WC   Chlorhexidine Gluconate Cloth  6 each Topical Q0600   Chlorhexidine Gluconate Cloth  6 each Topical Q0600   darbepoetin (ARANESP) injection - DIALYSIS  150 mcg Subcutaneous Q Mon-1800   feeding supplement (PROSource TF20)  60 mL Per Tube Daily   heparin injection (subcutaneous)  5,000 Units Subcutaneous Q8H   hydrALAZINE  100 mg Per Tube Q8H   insulin aspart  0-15 Units Subcutaneous Q6H   insulin glargine-yfgn  10 Units Subcutaneous Daily   multivitamin  1 tablet Per Tube QHS   nutrition supplement (JUVEN)  1 packet Per Tube BID BM   mouth rinse  15 mL Mouth Rinse 4 times per day   pantoprazole (PROTONIX) IV  40 mg Intravenous Q12H   QUEtiapine  25 mg Oral Once   QUEtiapine  50 mg Per NG tube BID   sevelamer carbonate  2.4 g Per Tube TID WC    Continuous Infusions:  sodium chloride Stopped (08/30/22 0033)   feeding supplement (NEPRO CARB STEADY) 50 mL/hr at 09/18/22 2000     LOS: 26 days    Time spent: 45 minutes spent on chart review, discussion with nursing staff, consultants, updating family and interview/physical exam; more than 50% of that time was spent in counseling and/or coordination of care.    Danine Hor J [place] DO [facility] Available via Epic secure chat 7am-7pm After these hours, please refer to coverage provider listed on [email or web address] 09/19/2022, 10:39 AM

## 2022-09-19 NOTE — Progress Notes (Signed)
[facility] Progress Note   Subjective:   seen in room - remains non communicative and in restraints. TDC in place.    Pall care following and a family meeting completed yest - family wanted to talk prior to making decision. No one in room today.  Had uneventful dialysis yesterday.   Objective Vitals:   09/18/22 2344 09/19/22 0339 09/19/22 0848 09/19/22 1250  BP: 99/66 113/61 114/75 111/66  Pulse: 80 80 74 80  Resp: '16 18 19 20  '$ Temp: (!) 97.5 F (36.4 C) 98.1 F (36.7 C) 98.2 F (36.8 C) 97.6 F (36.4 C)  TempSrc:  Oral Axillary Axillary  SpO2: 97% 95% 96% 96%  Weight:      Height:       Physical Exam General: awake but not interactive, NG tube in  Heart: RRR, no murmurs, rubs or gallops Lungs: CTA anteriorly Abdomen: Soft, non-distended, +BS Extremities: no edema b/l lower extremties Neuro: not following commands today Dialysis Access:  LUE AVF + bruit, R IJ Tunneled  HD catheter c/d/i    Dialysis Orders: SW MWF 3h 52mn  80kg   400/800   2/2 bath  Hep 2000  LFA AVF - last HD 11/13, post 80.1kg - mircera 150 q2, last 11/13 - hectorol 431m IV q HD  Assessment/Plan: 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 S/P course of hypertonic fluids.  Mental status remains poor.  HTN/ vol  - getting amlodipine/ coreg/amlodipine per tube, also has PRNs ordered. No edema on exam, BP's variable. Appears euvolemic now. AMS - due to CVA/ bleed and now with delerium and ongoing MS changes. Appears this is his new baseline.   ESRD - pt required CRRT 11/15-11/17/23. Last HD 12/5 - was getting MWF but off schedule.  For now plan next HD 12/9 but later in day as family meeting is at noon - if GOC change would hold tx.   HD access - BUN was very high so access recirc was suspected. Using temp HD cath placed 11/27 by CCM and BUN better. IR consulted for fistulogram - done 12/4 and noted to be small with competing veins.  [facility] for now [facility]  Anemia of ESRD: Hgb 9s, continue Aranesp 15064mweekly while here. Secondary HPTH: CCa ok. renvela per NG tube now at goal. Continue VDRA.  DM2 - per pmd  GOC - full code - unclear where he'll go from here - if this is mainly delerium or his baseline status now.  Requiring restraints and not interacting.  Cortrak inserted for nutrition after failed swallow study and pulled out NG tube.   If this is his expected baseline long term I think it's really reasonable to consider hospice care -- pall care following.  Family meeting yesterday - pall care to f/u with family today re: decision.  Previously sounded like family wants him home - the only way I see that happening would be with hospice otherwise its a facility.   LinJannifer Hick [facility] Pager [phone] Labs  Lab 09/13/22 [id number] 09/15/22 0530 09/17/22 0435  HGB 10.2* 9.5* 10.2* 10.9*  ALBUMIN 3.0* 3.0* 3.2*  --   CALCIUM 9.2 8.8* 8.9 9.2  PHOS 5.1* 5.0*  --   --   CREATININE 9.40* 6.96* 6.68* 5.12*  K 4.2 3.8 3.5 4.3     Inpatient medications:  amLODipine  5 mg Per Tube BID   carvedilol  25  mg Per Tube BID WC   Chlorhexidine Gluconate Cloth  6 each Topical Q0600   Chlorhexidine Gluconate Cloth  6 each Topical Q0600   darbepoetin (ARANESP) injection - DIALYSIS  150 mcg Subcutaneous Q Mon-1800   feeding supplement (PROSource TF20)  60 mL Per Tube Daily   heparin injection (subcutaneous)  5,000 Units Subcutaneous Q8H   hydrALAZINE  100 mg Per Tube Q8H   insulin aspart  0-15 Units Subcutaneous Q6H   insulin glargine-yfgn  10 Units Subcutaneous Daily   multivitamin  1 tablet Per Tube QHS   nutrition supplement (JUVEN)  1 packet Per Tube BID BM   mouth rinse  15 mL Mouth Rinse 4 times per day   pantoprazole (PROTONIX) IV  40 mg Intravenous Q12H   QUEtiapine  25 mg Oral Once   QUEtiapine  50 mg Per NG tube BID   sevelamer carbonate  2.4 g Per Tube TID WC    sodium  chloride Stopped (08/30/22 0033)   feeding supplement (NEPRO CARB STEADY) 50 mL/hr at 09/18/22 2000   sodium chloride, acetaminophen (TYLENOL) oral liquid 160 mg/5 mL, bisacodyl, fentaNYL (SUBLIMAZE) injection, hydrALAZINE, labetalol, LORazepam, ondansetron (ZOFRAN) IV, mouth rinse

## 2022-09-19 NOTE — Progress Notes (Signed)
      Chart reviewed.  I spoke with patient's brother Mina Marble by phone in follow-up from family meeting yesterday. He requests another meeting tomorrow to discuss family's decision regarding continuing aggressive interventions versus transition to comfort care.   Plan for family meeting tomorrow at 62 noon.     Elie Confer, NP-C Palliative Medicine   Please call Palliative Medicine team phone with any questions [phone]. For individual providers please see AMION.   No charge

## 2022-09-20 DIAGNOSIS — N186 End stage renal disease: Secondary | ICD-10-CM | POA: Diagnosis not present

## 2022-09-20 DIAGNOSIS — R1310 Dysphagia, unspecified: Secondary | ICD-10-CM | POA: Diagnosis not present

## 2022-09-20 DIAGNOSIS — J9601 Acute respiratory failure with hypoxia: Secondary | ICD-10-CM | POA: Diagnosis not present

## 2022-09-20 DIAGNOSIS — I614 Nontraumatic intracerebral hemorrhage in cerebellum: Secondary | ICD-10-CM | POA: Diagnosis not present

## 2022-09-20 LAB — GLUCOSE, CAPILLARY
Glucose-Capillary: 151 mg/dL — ABNORMAL HIGH (ref 70–99)
Glucose-Capillary: 142 mg/dL — ABNORMAL HIGH (ref 70–99)
Glucose-Capillary: 143 mg/dL — ABNORMAL HIGH (ref 70–99)
Glucose-Capillary: 151 mg/dL — ABNORMAL HIGH (ref 70–99)

## 2022-09-20 LAB — TRIGLYCERIDES: Triglycerides: 160 mg/dL — ABNORMAL HIGH (ref <150)

## 2022-09-20 MED ORDER — CHLORHEXIDINE GLUCONATE CLOTH 2 % EX PADS
6.0000 | MEDICATED_PAD | Freq: Every day | CUTANEOUS | Status: DC
  Administered 2022-09-21 – 2022-09-22 (×2): 6 via TOPICAL

## 2022-09-20 NOTE — Plan of Care (Signed)
Problem: Activity: Goal: Ability to tolerate increased activity will improve 09/20/2022 0104 by Lorna Dibble, RN Outcome: Progressing 09/20/2022 0103 by Lorna Dibble, RN Outcome: Progressing   Problem: Respiratory: Goal: Ability to maintain a clear airway and adequate ventilation will improve 09/20/2022 0104 by Lorna Dibble, RN Outcome: Progressing 09/20/2022 0103 by Lorna Dibble, RN Outcome: Progressing   Problem: Role Relationship: Goal: Method of communication will improve 09/20/2022 0104 by Lorna Dibble, RN Outcome: Progressing 09/20/2022 0103 by Lorna Dibble, RN Outcome: Progressing   Problem: Education: Goal: Ability to describe self-care measures that may prevent or decrease complications (Diabetes Survival Skills Education) will improve 09/20/2022 0104 by Lorna Dibble, RN Outcome: Progressing 09/20/2022 0103 by Lorna Dibble, RN Outcome: Progressing Goal: Individualized Educational Video(s) 09/20/2022 0104 by Lorna Dibble, RN Outcome: Progressing 09/20/2022 0103 by Lorna Dibble, RN Outcome: Progressing   Problem: Coping: Goal: Ability to adjust to condition or change in health will improve 09/20/2022 0104 by Lorna Dibble, RN Outcome: Progressing 09/20/2022 0103 by Lorna Dibble, RN Outcome: Progressing   Problem: Fluid Volume: Goal: Ability to maintain a balanced intake and output will improve 09/20/2022 0104 by Lorna Dibble, RN Outcome: Progressing 09/20/2022 0103 by Lorna Dibble, RN Outcome: Progressing   Problem: Health Behavior/Discharge Planning: Goal: Ability to identify and utilize available resources and services will improve 09/20/2022 0104 by Lorna Dibble, RN Outcome: Progressing 09/20/2022 0103 by Lorna Dibble, RN Outcome: Progressing Goal: Ability to manage health-related needs will improve 09/20/2022 0104 by Lorna Dibble, RN Outcome: Progressing 09/20/2022 0103 by Lorna Dibble, RN Outcome: Progressing   Problem: Metabolic: Goal: Ability to maintain appropriate glucose levels will improve 09/20/2022 0104 by Lorna Dibble, RN Outcome: Progressing 09/20/2022 0103 by Lorna Dibble, RN Outcome: Progressing   Problem: Nutritional: Goal: Maintenance of adequate nutrition will improve 09/20/2022 0104 by Lorna Dibble, RN Outcome: Progressing 09/20/2022 0103 by Lorna Dibble, RN Outcome: Progressing Goal: Progress toward achieving an optimal weight will improve 09/20/2022 0104 by Lorna Dibble, RN Outcome: Progressing 09/20/2022 0103 by Lorna Dibble, RN Outcome: Progressing   Problem: Skin Integrity: Goal: Risk for impaired skin integrity will decrease 09/20/2022 0104 by Lorna Dibble, RN Outcome: Progressing 09/20/2022 0103 by Lorna Dibble, RN Outcome: Progressing   Problem: Tissue Perfusion: Goal: Adequacy of tissue perfusion will improve 09/20/2022 0104 by Lorna Dibble, RN Outcome: Progressing 09/20/2022 0103 by Lorna Dibble, RN Outcome: Progressing   Problem: Safety: Goal: Non-violent Restraint(s) 09/20/2022 0104 by Lorna Dibble, RN Outcome: Progressing 09/20/2022 0103 by Lorna Dibble, RN Outcome: Progressing   Problem: Education: Goal: Knowledge of General Education information will improve Description: Including pain rating scale, medication(s)/side effects and non-pharmacologic comfort measures 09/20/2022 0104 by Lorna Dibble, RN Outcome: Progressing 09/20/2022 0103 by Lorna Dibble, RN Outcome: Progressing   Problem: Health Behavior/Discharge Planning: Goal: Ability to manage health-related needs will improve 09/20/2022 0104 by Lorna Dibble, RN Outcome: Progressing 09/20/2022 0103 by Lorna Dibble, RN Outcome: Progressing   Problem:  Clinical Measurements: Goal: Ability to maintain clinical measurements within normal limits will improve 09/20/2022 0104 by Lorna Dibble, RN Outcome: Progressing 09/20/2022 0103 by Lorna Dibble, RN Outcome: Progressing Goal: Will remain free from infection 09/20/2022 0104 by Lorna Dibble, RN Outcome: Progressing 09/20/2022 0103 by Lorna Dibble, RN Outcome: Progressing Goal: Diagnostic test results will improve 09/20/2022 0104 by Lamar Blinks  S, RN Outcome: Progressing 09/20/2022 0103 by Lorna Dibble, RN Outcome: Progressing Goal: Respiratory complications will improve 09/20/2022 0104 by Lorna Dibble, RN Outcome: Progressing 09/20/2022 0103 by Lorna Dibble, RN Outcome: Progressing Goal: Cardiovascular complication will be avoided 09/20/2022 0104 by Lorna Dibble, RN Outcome: Progressing 09/20/2022 0103 by Lorna Dibble, RN Outcome: Progressing   Problem: Activity: Goal: Risk for activity intolerance will decrease 09/20/2022 0104 by Lorna Dibble, RN Outcome: Progressing 09/20/2022 0103 by Lorna Dibble, RN Outcome: Progressing   Problem: Nutrition: Goal: Adequate nutrition will be maintained 09/20/2022 0104 by Lorna Dibble, RN Outcome: Progressing 09/20/2022 0103 by Lorna Dibble, RN Outcome: Progressing   Problem: Coping: Goal: Level of anxiety will decrease 09/20/2022 0104 by Lorna Dibble, RN Outcome: Progressing 09/20/2022 0103 by Lorna Dibble, RN Outcome: Progressing   Problem: Elimination: Goal: Will not experience complications related to bowel motility 09/20/2022 0104 by Lorna Dibble, RN Outcome: Progressing 09/20/2022 0103 by Lorna Dibble, RN Outcome: Progressing Goal: Will not experience complications related to urinary retention 09/20/2022 0104 by Lorna Dibble, RN Outcome: Progressing 09/20/2022 0103 by  Lorna Dibble, RN Outcome: Progressing   Problem: Pain Managment: Goal: General experience of comfort will improve 09/20/2022 0104 by Lorna Dibble, RN Outcome: Progressing 09/20/2022 0103 by Lorna Dibble, RN Outcome: Progressing   Problem: Safety: Goal: Ability to remain free from injury will improve 09/20/2022 0104 by Lorna Dibble, RN Outcome: Progressing 09/20/2022 0103 by Lorna Dibble, RN Outcome: Progressing   Problem: Skin Integrity: Goal: Risk for impaired skin integrity will decrease 09/20/2022 0104 by Lorna Dibble, RN Outcome: Progressing 09/20/2022 0103 by Lorna Dibble, RN Outcome: Progressing   Problem: Education: Goal: Knowledge of disease or condition will improve Outcome: Progressing Goal: Knowledge of secondary prevention will improve (MUST DOCUMENT ALL) Outcome: Progressing Goal: Knowledge of patient specific risk factors will improve Elta [place] N/A or DELETE if not current risk factor) Outcome: Progressing

## 2022-09-20 NOTE — Progress Notes (Addendum)
PROGRESS NOTE    Randy Ross  ZOX:[id number] DOB: 1971-09-09 DOA: 08/24/2022 PCP: Medicine, [facility]    Brief Narrative:   Randy Ross is a 51 y.o. male with past medical history significant for ESRD on HD, DM2, essential hypertension who presented to [facility] ED with sudden onset slurred speech, headache.  Workup in the ER with elevated BP of 260/140 and CT head showing cerebellar hemorrhage.  Patient was initially intubated in the ED for airway protection and admitted to the PCCM/neurology service.  Significant Hospital events: 11/14: Admitted for acute cerebellar hemorrhage; intubated in the ER, started on hypertonic saline 11/15: Neurosurgery were consulted, recommended against surgical evacuation 11/16: Cefepime started for suspected aspiration 11/17: Required 1u PRBC transfusion due to CRRT filter 11/19: EEG nonspecific 11/21: MRI brain shows new scattered bilateral acute infarcts 11/22: Completed 7 days antibiotics 11/24: IR consulted given recirculation syndrome, high BUN; plan for fistulogram pending 11/27: Temp cath HD cath placed  11/30: Self-extubated 12/1: Persistent delirium 12/2: Transferred out of ICU to [facility] service 12/3: Pulled out cortrak 12/4: Failed SLP, needs cortrak, fistulogram shows poor outflow 12/5: TDC placed, feeding tube replaced 12/6: HD catheter placed by IR 12/9: Family meeting with palliative care, family to decide on possible transitioning to comfort measures  12/11: Family decided to continue aggressive measures, IR consulted for PEG tube   Assessment & Plan:   Acute Cerebellar hemorrhage 2/2 poorly controlled hypertension Cerebral edema Patient presenting to ED via EMS with mental status change, left-sided weakness, slurred speech and left-sided gaze preference.  Patient was noted to be severely hypertensive on admission.  Patient was subsequently intubated for airway protection and CT notable for IPH right  cerebellar hemisphere with mass effect.  Patient was started on Cleviprex, Versed and admitted under the critical care/neurology service.  Patient was seen by neurosurgery who did not recommend surgical intervention; and patient was started on hypertonic saline and placed on CRRT to minimize rapid fluid shifts. -- Continues with poor progression in terms of mental status, repeat CT head x 3 remained stable overall very poor prognosis and palliative care following.  Neurology now signed off.  And CRRT converted back to HD per nephrology.  Family meeting planned for 12/9; family now to discuss amongst themselves whether transitioning to comfort measures versus need for PEG tube and SNF placement.  Dysphagia Etiology low please secondary to persistent encephalopathy, patient has pulled out core track [name], replaced today.  Palliative care following, plan for family meeting on 12/9 and if no progression towards oral intake and family still wanting aggressive measures will need PEG tube placed. -- Continue tube feeds via core track  -- IR consulted for PEG tube placement -- Aspiration precautions  Lower GI bleeding: Resolved Noted by nurse, transient, no change in hemoglobin.  No further bleeding reported. 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  TTE with LVEF 50-55%.  LDL 91, hemoglobin A1c 5.9.  Holding antiplatelet for anemia, cerebellar hemorrhage.  Neurology now signed off; and do not recommend further evaluation for cardiac source of embolism as he is not a good long-term anticoagulation candidate.  Hypertensive emergency Hx essential hypertension BP severely elevated on admission, likely leading to acute cerebellar hemorrhage. -- Amlodipine 5 mg per tube twice daily -- Carvedilol 25 mg per tube twice daily -- Hydralazine 100 mg per  tube every 8 hours  Acute respiratory failure with hypoxia: Resolved Aspiration pneumonia Patient was initially intubated for airway protection upon ED arrival, patient self-extubated on 11/30.  Completed course of antibiotics with cefepime followed by ceftriaxone.  Acute metabolic encephalopathy Patient continues to not be able to follow commands, likely secondary to brain insult. -- Supportive care, Seroquel  Type 2 diabetes mellitus Hemoglobin A1c 5.9 08/25/2022, well-controlled. -- Semglee 10 units  daily -- Moderate SSI for coverage -- CBGs qAC/HS  ESRD on HD -- Continue HD per nephrology  Anemia of chronic medical/renal disease Hemoglobin stable, continue intermittent monitoring.  Transfuse hemoglobin < 7.0  Clotted fistula Fistulogram showed poor outflow from fistula, nephrology consulted IR for HD catheter placement which was performed on 12/6.   DVT prophylaxis: heparin injection 5,000 Units Start: 09/12/22 1445 Place and maintain sequential compression device Start: 08/25/22 1025    Code Status: Full Code Family Communication: No family present at bedside this morning, long family discussion with palliative care and multiple family members yesterday  Disposition Plan:  Level of care: Telemetry Medical Status is: Inpatient Remains inpatient appropriate because: Pending placement, PEG tube placement, will need SNF placement.    Consultants:  PCCM: Signed off 12/2 Neurology: Signed off 12/4 Neurosurgery Nephrology Interventional radiology Palliative care  Procedures:  HD catheter placement, IR 12/6  Antimicrobials:  Cefepime 11/17 - 11/18 Ceftriaxone 11/19 - 11/22 Cefazolin 12/6 - 12/6   Subjective: Patient seen examined at bedside, resting comfortably.  Lying in bed.  No family present. Remains confused, in restraints, nonconversant.  No acute events overnight per nursing staff.  Palliative care followed up with family who request continue aggressive  measures and will consult IR for PEG tube placement.  Will need SNF placement.  Objective: Vitals:   09/19/22 1930 09/20/22 0000 09/20/22 0400 09/20/22 0800  BP: 114/69 99/66 106/63 116/75  Pulse: 78 67 71 79  Resp: '20 18  14  '$ Temp: 97.8 F (36.6 C) 98 F (36.7 C) 98.1 F (36.7 C) 98 F (36.7 C)  TempSrc: Axillary Axillary Axillary Oral  SpO2: 100% 100% 100% 100%  Weight:      Height:        Intake/Output Summary (Last 24 hours) at 09/20/2022 1103 Last data filed at 09/20/2022 0400 Gross per 24 hour  Intake 1200 ml  Output --  Net 1200 ml    Filed Weights   09/16/22 1441 09/18/22 1430 09/18/22 1834  Weight: 73.4 kg 78.2 kg 71.6 kg    Examination:  Physical Exam: GEN: NAD, lying in bed, nonverbal makes eye contact; cortrack in place PULM: CTAB w/o wheezes/crackles, normal respiratory effort on room air CV: RRR w/o M/G/R GI: abd soft, NTND, NABS MSK: no peripheral edema    Data Reviewed: I have personally reviewed following labs and imaging studies  CBC: Recent Labs  Lab 09/14/22 0650 09/15/22 0530 09/17/22 0435  WBC 10.9* 10.1 10.8*  HGB 9.5* 10.2* 10.9*  HCT 30.1* 32.8* 34.3*  MCV 70.3* 70.7* 70.4*  PLT 683* 614* 478*   Basic Metabolic Panel: Recent Labs  Lab 09/14/22 0650 09/15/22 0530 09/16/22 0634 09/17/22 0435 09/18/22 0251  NA 137 137  --  130*  --   K 3.8 3.5  --  4.3  --   CL 99 98  --  89*  --   CO2 24 26  --  26  --   GLUCOSE 93 100*  --  90  --   BUN 81* 59*  --  35*  --  CREATININE 6.96* 6.68*  --  5.12*  --   CALCIUM 8.8* 8.9  --  9.2  --   MG 2.3 2.0 2.4 2.0 2.9*  PHOS 5.0*  --   --   --   --    GFR: Estimated Creatinine Clearance: 14.8 mL/min (A) (by C-G formula based on SCr of 5.12 mg/dL (H)). Liver Function Tests: Recent Labs  Lab 09/14/22 0650 09/15/22 0530  AST  --  30  ALT  --  43  ALKPHOS  --  91  BILITOT  --  1.0  PROT  --  7.9  ALBUMIN 3.0* 3.2*   No results for input(s): "LIPASE", "AMYLASE" in the last  168 hours. No results for input(s): "AMMONIA" in the last 168 hours. Coagulation Profile: No results for input(s): "INR", "PROTIME" in the last 168 hours. Cardiac Enzymes: No results for input(s): "CKTOTAL", "CKMB", "CKMBINDEX", "TROPONINI" in the last 168 hours. BNP (last 3 results) No results for input(s): "PROBNP" in the last 8760 hours. HbA1C: No results for input(s): "HGBA1C" in the last 72 hours. CBG: Recent Labs  Lab 09/19/22 1246 09/19/22 1656 09/19/22 2343 09/20/22 0548 09/20/22 0637  GLUCAP 151* 144* 184* 151* 142*   Lipid Profile: Recent Labs    09/20/22 0639  TRIG 160*   Thyroid Function Tests: No results for input(s): "TSH", "T4TOTAL", "FREET4", "T3FREE", "THYROIDAB" in the last 72 hours. Anemia Panel: No results for input(s): "VITAMINB12", "FOLATE", "FERRITIN", "TIBC", "IRON", "RETICCTPCT" in the last 72 hours. Sepsis Labs: No results for input(s): "PROCALCITON", "LATICACIDVEN" in the last 168 hours.  No results found for this or any previous visit (from the past 240 hour(s)).       Radiology Studies: No results found.      Scheduled Meds:  amLODipine  5 mg Per Tube BID   carvedilol  25 mg Per Tube BID WC   Chlorhexidine Gluconate Cloth  6 each Topical Q0600   Chlorhexidine Gluconate Cloth  6 each Topical Q0600   darbepoetin (ARANESP) injection - DIALYSIS  150 mcg Subcutaneous Q Mon-1800   feeding supplement (PROSource TF20)  60 mL Per Tube Daily   heparin injection (subcutaneous)  5,000 Units Subcutaneous Q8H   hydrALAZINE  100 mg Per Tube Q8H   insulin aspart  0-15 Units Subcutaneous Q6H   insulin glargine-yfgn  10 Units Subcutaneous Daily   multivitamin  1 tablet Per Tube QHS   nutrition supplement (JUVEN)  1 packet Per Tube BID BM   mouth rinse  15 mL Mouth Rinse 4 times per day   pantoprazole (PROTONIX) IV  40 mg Intravenous Q12H   QUEtiapine  25 mg Oral Once   QUEtiapine  50 mg Per NG tube BID   sevelamer carbonate  2.4 g Per Tube TID  WC   Continuous Infusions:  sodium chloride Stopped (08/30/22 0033)   feeding supplement (NEPRO CARB STEADY) 1,000 mL (09/20/22 0559)     LOS: 27 days    Time spent: 45 minutes spent on chart review, discussion with nursing staff, consultants, updating family and interview/physical exam; more than 50% of that time was spent in counseling and/or coordination of care.    Islam Villescas J [place] DO [facility] Available via Epic secure chat 7am-7pm After these hours, please refer to coverage provider listed on [email or web address] 09/20/2022, 11:03 AM

## 2022-09-20 NOTE — Plan of Care (Signed)
  Problem: Activity: Goal: Ability to tolerate increased activity will improve Outcome: Progressing   Problem: Respiratory: Goal: Ability to maintain a clear airway and adequate ventilation will improve Outcome: Progressing   Problem: Role Relationship: Goal: Method of communication will improve Outcome: Progressing   Problem: Education: Goal: Ability to describe self-care measures that may prevent or decrease complications (Diabetes Survival Skills Education) will improve Outcome: Progressing Goal: Individualized Educational Video(s) Outcome: Progressing   Problem: Coping: Goal: Ability to adjust to condition or change in health will improve Outcome: Progressing   Problem: Fluid Volume: Goal: Ability to maintain a balanced intake and output will improve Outcome: Progressing   Problem: Health Behavior/Discharge Planning: Goal: Ability to identify and utilize available resources and services will improve Outcome: Progressing Goal: Ability to manage health-related needs will improve Outcome: Progressing   Problem: Metabolic: Goal: Ability to maintain appropriate glucose levels will improve Outcome: Progressing   Problem: Nutritional: Goal: Maintenance of adequate nutrition will improve Outcome: Progressing Goal: Progress toward achieving an optimal weight will improve Outcome: Progressing   Problem: Skin Integrity: Goal: Risk for impaired skin integrity will decrease Outcome: Progressing   Problem: Tissue Perfusion: Goal: Adequacy of tissue perfusion will improve Outcome: Progressing   Problem: Safety: Goal: Non-violent Restraint(s) Outcome: Progressing   Problem: Education: Goal: Knowledge of General Education information will improve Description: Including pain rating scale, medication(s)/side effects and non-pharmacologic comfort measures Outcome: Progressing   Problem: Health Behavior/Discharge Planning: Goal: Ability to manage health-related needs will  improve Outcome: Progressing   Problem: Clinical Measurements: Goal: Ability to maintain clinical measurements within normal limits will improve Outcome: Progressing Goal: Will remain free from infection Outcome: Progressing Goal: Diagnostic test results will improve Outcome: Progressing Goal: Respiratory complications will improve Outcome: Progressing Goal: Cardiovascular complication will be avoided Outcome: Progressing   Problem: Activity: Goal: Risk for activity intolerance will decrease Outcome: Progressing   Problem: Nutrition: Goal: Adequate nutrition will be maintained Outcome: Progressing   Problem: Coping: Goal: Level of anxiety will decrease Outcome: Progressing   Problem: Elimination: Goal: Will not experience complications related to bowel motility Outcome: Progressing Goal: Will not experience complications related to urinary retention Outcome: Progressing   Problem: Pain Managment: Goal: General experience of comfort will improve Outcome: Progressing   Problem: Safety: Goal: Ability to remain free from injury will improve Outcome: Progressing   Problem: Skin Integrity: Goal: Risk for impaired skin integrity will decrease Outcome: Progressing   Problem: Education: Goal: Knowledge of disease or condition will improve Outcome: Progressing Goal: Knowledge of secondary prevention will improve (MUST DOCUMENT ALL) Outcome: Progressing Goal: Knowledge of patient specific risk factors will improve Elta [place] N/A or DELETE if not current risk factor) Outcome: Progressing

## 2022-09-20 NOTE — Progress Notes (Signed)
[facility] Progress Note   Subjective:   seen in room - remains non communicative  Objective Vitals:   09/20/22 0400 09/20/22 0800 09/20/22 1121 09/20/22 1526  BP: 106/63 116/75 115/72 (P) 134/75  Pulse: 71 79 69 (P) 74  Resp:  14 14 (P) 14  Temp: 98.1 F (36.7 C) 98 F (36.7 C) 97.6 F (36.4 C) (P) 98.1 F (36.7 C)  TempSrc: Axillary Oral Oral (P) Oral  SpO2: 100% 100% 97% (P) 99%  Weight:      Height:       Physical Exam General: awake but not interactive, NG tube in, looks at you but won't follow commands Heart: RRR, no murmurs, rubs or gallops Lungs: CTA anteriorly Abdomen: Soft, non-distended, +BS Extremities: no edema b/l lower extremties Neuro: as above Dialysis Access:  LUE AVF + bruit, R IJ Tunneled  HD catheter c/d/i    Dialysis Orders: SW MWF 3h 27mn  80kg   400/800   2/2 bath  Hep 2000  LFA AVF - last HD 11/13, post 80.1kg - mircera 150 q2, last 11/13 - hectorol 468m IV q HD  Assessment/Plan: 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 S/P course of hypertonic fluids.  Mental status remains poor.  HTN/ vol  - getting amlodipine/ coreg/amlodipine per tube, also has PRNs ordered. No edema on exam, BP's variable. Appears euvolemic now. AMS - due to CVA/ bleed and now with delerium and ongoing MS changes. Appears this is his new baseline.   ESRD - pt required CRRT 11/15-11/17/23. We are off doing HD here TTS which is off schedule, will cont for now which helps w/ staffing.  HD access - BUN was very high so access recirc was suspected. We used the temp HD cath placed 11/27 by CCM and BUN improved. IR did the fistulogram 12/4 and noted to be small with competing veins. Use catheter for now, TDC placed 12/6.  Anemia of ESRD: Hgb 9s, continue Aranesp 15095mweekly while here. Secondary HPTH: CCa ok. renvela per NG tube now at goal. Continue VDRA.  DM2 - per pmd  GOC - full code still. Poor cognitive function sp  brain bleed. Per PCT/ pmd.    RobKelly SplinterD 09/20/2022, 5:01 PM  Recent Labs  Lab 09/14/22 0650 09/15/22 0530 09/17/22 0435  HGB 9.5* 10.2* 10.9*  ALBUMIN 3.0* 3.2*  --   CALCIUM 8.8* 8.9 9.2  PHOS 5.0*  --   --   CREATININE 6.96* 6.68* 5.12*  K 3.8 3.5 4.3    Inpatient medications:  amLODipine  5 mg Per Tube BID   carvedilol  25 mg Per Tube BID WC   Chlorhexidine Gluconate Cloth  6 each Topical Q0600   Chlorhexidine Gluconate Cloth  6 each Topical Q0600   darbepoetin (ARANESP) injection - DIALYSIS  150 mcg Subcutaneous Q Mon-1800   feeding supplement (PROSource TF20)  60 mL Per Tube Daily   heparin injection (subcutaneous)  5,000 Units Subcutaneous Q8H   hydrALAZINE  100 mg Per Tube Q8H   insulin aspart  0-15 Units Subcutaneous Q6H   insulin glargine-yfgn  10 Units Subcutaneous Daily   multivitamin  1 tablet Per Tube QHS   nutrition supplement (JUVEN)  1 packet Per Tube BID BM   mouth rinse  15 mL Mouth Rinse 4 times per day   pantoprazole (PROTONIX) IV  40 mg Intravenous Q12H   QUEtiapine  50 mg Per NG tube BID   sevelamer carbonate  2.4  g Per Tube TID WC    sodium chloride Stopped (08/30/22 0033)   feeding supplement (NEPRO CARB STEADY) 1,000 mL (09/20/22 0559)   sodium chloride, acetaminophen (TYLENOL) oral liquid 160 mg/5 mL, bisacodyl, fentaNYL (SUBLIMAZE) injection, hydrALAZINE, labetalol, LORazepam, ondansetron (ZOFRAN) IV, mouth rinse

## 2022-09-20 NOTE — Plan of Care (Signed)
  Problem: Activity: Goal: Ability to tolerate increased activity will improve Outcome: Progressing   Problem: Respiratory: Goal: Ability to maintain a clear airway and adequate ventilation will improve Outcome: Progressing   Problem: Role Relationship: Goal: Method of communication will improve Outcome: Progressing   Problem: Education: Goal: Ability to describe self-care measures that may prevent or decrease complications (Diabetes Survival Skills Education) will improve Outcome: Progressing Goal: Individualized Educational Video(s) Outcome: Progressing   Problem: Coping: Goal: Ability to adjust to condition or change in health will improve Outcome: Progressing   Problem: Fluid Volume: Goal: Ability to maintain a balanced intake and output will improve Outcome: Progressing   Problem: Health Behavior/Discharge Planning: Goal: Ability to identify and utilize available resources and services will improve Outcome: Progressing Goal: Ability to manage health-related needs will improve Outcome: Progressing   Problem: Metabolic: Goal: Ability to maintain appropriate glucose levels will improve Outcome: Progressing   Problem: Nutritional: Goal: Maintenance of adequate nutrition will improve Outcome: Progressing Goal: Progress toward achieving an optimal weight will improve Outcome: Progressing   Problem: Skin Integrity: Goal: Risk for impaired skin integrity will decrease Outcome: Progressing   Problem: Tissue Perfusion: Goal: Adequacy of tissue perfusion will improve Outcome: Progressing   Problem: Safety: Goal: Non-violent Restraint(s) Outcome: Progressing   Problem: Education: Goal: Knowledge of General Education information will improve Description: Including pain rating scale, medication(s)/side effects and non-pharmacologic comfort measures Outcome: Progressing   Problem: Health Behavior/Discharge Planning: Goal: Ability to manage health-related needs will  improve Outcome: Progressing   Problem: Clinical Measurements: Goal: Ability to maintain clinical measurements within normal limits will improve Outcome: Progressing Goal: Will remain free from infection Outcome: Progressing Goal: Diagnostic test results will improve Outcome: Progressing Goal: Respiratory complications will improve Outcome: Progressing Goal: Cardiovascular complication will be avoided Outcome: Progressing   Problem: Activity: Goal: Risk for activity intolerance will decrease Outcome: Progressing   Problem: Nutrition: Goal: Adequate nutrition will be maintained Outcome: Progressing   Problem: Coping: Goal: Level of anxiety will decrease Outcome: Progressing   Problem: Elimination: Goal: Will not experience complications related to bowel motility Outcome: Progressing Goal: Will not experience complications related to urinary retention Outcome: Progressing   Problem: Pain Managment: Goal: General experience of comfort will improve Outcome: Progressing   Problem: Safety: Goal: Ability to remain free from injury will improve Outcome: Progressing   Problem: Skin Integrity: Goal: Risk for impaired skin integrity will decrease Outcome: Progressing

## 2022-09-20 NOTE — Progress Notes (Signed)
Palliative Medicine Progress Note   Patient Name: Randy Ross       Date: 09/20/2022 DOB: December 05, 1970  Age: 51 y.o. MRN#: [id number] Attending Physician: [place] Eric J, DO Primary Care Physician: Medicine, [place] [facility] Admit Date: 08/24/2022    HPI/Patient Profile: 51 y.o. male  with past medical history of Anemia, ESRD on HD, diabetes, and hypertension admitted on 08/24/2022 with acute IPH with severe mass effect/brain swelling and hypertensive emergency.  Patient also with inability to protect airway due to mental status.  PMT consulted to discuss goals of care.   Per family patient goes by "Randy Ross".   Subjective: Patient is awake and appears to be interactive with family.   Family Meeting: I met today with multiple family members including son, multiple siblings, and mother. They report that Randy Ross started talking yesterday, asking about work and stating he wanted to go home.  They reports he recognized all family members. They feel very encouraged this is progress.   We reviewed natural trajectory of significant neurologic injury such as ICH. Family verbalizes understandning that Randy Ross will not likely not return to his previous baseline. They understand he will need rehab at minimum, and possibility he may require long-term care.   Family is in agreement to continue full scope at this time, and wish to proceed with PEG tube placement is needed. They do hope that Randy Ross can be reassessed by SLP to see if swallowing function has improved in light of his perceived neurologic improvement - will discuss with care team.    Objective:  Physical Exam Vitals reviewed.  Constitutional:      General: He is not in acute distress.    Appearance: He is ill-appearing.  HENT:      Head:     Comments: Cortrak n place Pulmonary:     Effort: Pulmonary effort is normal.  Neurological:     Mental Status: He is alert.     Motor: Weakness present.           Palliative Medicine Assessment & Plan   Assessment: Principal Problem:   Acute cerebellar hemorrhage (HCC) Active Problems:   Anemia   ESRD (end stage renal disease) ([place])   Diabetes mellitus ([place])   Acute respiratory failure with hypoxia ([place])   Hypertensive emergency   Pressure injury of skin  Acute metabolic encephalopathy and delirium   Cerebral edema (HCC)   Acute embolic stroke [facility])   Lower GI bleeding   Dysphagia   Palliative care by specialist    Recommendations/Plan: Full code Continue full scope care Family has decided to proceed with PEG if needed Recommend reassessment by SLP - family willing to be present as patient may respond better for them (call brother/Dang) PMT will continue to follow  Prognosis:  Unable to determine  Discharge Planning: To Be Determined  Care plan was discussed with Dr. [place] and SLP (via secure chat), RN  Thank you for allowing the Palliative Medicine Team to assist in the care of this patient.   Greater than 50%  of this time was spent counseling and coordinating care related to the above assessment and plan.  Total time: 67 minutes   Lavena Bullion, NP Palliative Medicine   Please contact Palliative Medicine Team phone at [phone] for questions and concerns.  For individual provider, see AMION.

## 2022-09-21 ENCOUNTER — Inpatient Hospital Stay (HOSPITAL_COMMUNITY): Payer: No Typology Code available for payment source

## 2022-09-21 DIAGNOSIS — I614 Nontraumatic intracerebral hemorrhage in cerebellum: Secondary | ICD-10-CM | POA: Diagnosis not present

## 2022-09-21 LAB — CBC WITH DIFFERENTIAL/PLATELET
Abs Immature Granulocytes: 0.07 10*3/uL (ref 0.00–0.07)
Basophils Absolute: 0 10*3/uL (ref 0.0–0.1)
Basophils Relative: 0 %
Eosinophils Absolute: 0.4 10*3/uL (ref 0.0–0.5)
Eosinophils Relative: 3 %
HCT: 32 % — ABNORMAL LOW (ref 39.0–52.0)
Hemoglobin: 10.6 g/dL — ABNORMAL LOW (ref 13.0–17.0)
Immature Granulocytes: 1 %
Lymphocytes Relative: 9 %
Lymphs Abs: 1.1 10*3/uL (ref 0.7–4.0)
MCH: 22.7 pg — ABNORMAL LOW (ref 26.0–34.0)
MCHC: 33.1 g/dL (ref 30.0–36.0)
MCV: 68.5 fL — ABNORMAL LOW (ref 80.0–100.0)
Monocytes Absolute: 0.9 10*3/uL (ref 0.1–1.0)
Monocytes Relative: 7 %
Neutro Abs: 10.1 10*3/uL — ABNORMAL HIGH (ref 1.7–7.7)
Neutrophils Relative %: 80 %
Platelets: 349 10*3/uL (ref 150–400)
RBC: 4.67 MIL/uL (ref 4.22–5.81)
RDW: 20.3 % — ABNORMAL HIGH (ref 11.5–15.5)
WBC: 12.6 10*3/uL — ABNORMAL HIGH (ref 4.0–10.5)
nRBC: 0.2 % (ref 0.0–0.2)

## 2022-09-21 LAB — GLUCOSE, CAPILLARY
Glucose-Capillary: 124 mg/dL — ABNORMAL HIGH (ref 70–99)
Glucose-Capillary: 141 mg/dL — ABNORMAL HIGH (ref 70–99)
Glucose-Capillary: 169 mg/dL — ABNORMAL HIGH (ref 70–99)
Glucose-Capillary: 182 mg/dL — ABNORMAL HIGH (ref 70–99)

## 2022-09-21 LAB — RENAL FUNCTION PANEL
Albumin: 3.2 g/dL — ABNORMAL LOW (ref 3.5–5.0)
Anion gap: 19 — ABNORMAL HIGH (ref 5–15)
BUN: 160 mg/dL — ABNORMAL HIGH (ref 6–20)
CO2: 24 mmol/L (ref 22–32)
Calcium: 9.8 mg/dL (ref 8.9–10.3)
Chloride: 88 mmol/L — ABNORMAL LOW (ref 98–111)
Creatinine, Ser: 10.17 mg/dL — ABNORMAL HIGH (ref 0.61–1.24)
GFR, Estimated: 6 mL/min — ABNORMAL LOW (ref >60)
Glucose, Bld: 125 mg/dL — ABNORMAL HIGH (ref 70–99)
Phosphorus: 5.2 mg/dL — ABNORMAL HIGH (ref 2.5–4.6)
Potassium: 4.6 mmol/L (ref 3.5–5.1)
Sodium: 131 mmol/L — ABNORMAL LOW (ref 135–145)

## 2022-09-21 MED ORDER — HEPARIN SODIUM (PORCINE) 1000 UNIT/ML IJ SOLN
INTRAMUSCULAR | Status: AC
  Administered 2022-09-21: 3200 [IU]
  Filled 2022-09-21: qty 4

## 2022-09-21 NOTE — TOC Initial Note (Addendum)
Transition of Care [facility]) - Initial/Assessment Note    Patient Details  Name: Randy Ross MRN: [id number] Date of Birth: [birth year]  [facility] CM/SW Contact:    Jinger Neighbors, LCSW Phone Number: 09/21/2022, 11:27 AM  Clinical Narrative:                 CSW attempted to contact pt's family members (all who are listed on facesheet): no answer. CSW will continue to work on work up and fax out. CSW completed FL2 and messaged PT regarding a f/u assessment, as current recommendation is SNF.  CSW received a returned call from pt's sister, Lawson Fiscal. Lawson Fiscal reports the family is now on board for SNF. Fax out complete. CSW will follow up with Lawson Fiscal once bed offers are received.   Expected Discharge Plan: Skilled Nursing Facility Barriers to Discharge: Continued Medical Work up   Patient Goals and CMS Choice   CMS Medicare.gov Compare Post Acute Care list provided to:: Patient Represenative (must comment) (pt family) Choice offered to / list presented to : Sibling  Expected Discharge Plan and Services Expected Discharge Plan: [place]       Living arrangements for the past 2 months: [place]                                      Prior Living Arrangements/Services Living arrangements for the past 2 months: Single Family Home Lives with:: Siblings Patient language and need for interpreter reviewed:: Yes        Need for Family Participation in Patient Care: Yes (Comment) Care giver support system in place?: Yes (comment) Current home services: Home OT Criminal Activity/Legal Involvement Pertinent to Current Situation/Hospitalization: No - Comment as needed  Activities of Daily Living   ADL Screening (condition at time of admission) Patient's cognitive ability adequate to safely complete daily activities?: Yes  Permission Sought/Granted      Share Information with NAME: Kemp,[name]     Permission granted to share info w  Relationship: Wesolowski,[name]; sister  Permission granted to share info w Contact Information: Niven,[name]  Emotional Assessment Appearance:: Appears stated age            Admission diagnosis:  ICH (intracerebral hemorrhage) ([place]) [I61.9] Patient Active Problem List   Diagnosis Date Noted   Palliative care by specialist 09/16/2022   Cerebral edema ([place]) 20/94/7096   Acute embolic stroke ([place]) 28/36/6294   Lower GI bleeding 09/12/2022   Dysphagia 09/12/2022   Gastrointestinal hemorrhage 76/54/6503   Acute metabolic encephalopathy and delirium 09/10/2022   Pressure injury of skin 09/01/2022   Acute respiratory failure with hypoxia ([place]) 08/25/2022   Hypertensive emergency 08/25/2022   Encounter for continuous renal replacement therapy (CRRT) for acute renal failure ([place]) 08/25/2022   Acute cerebellar hemorrhage ([place]) 08/25/2022   ESRD (end stage renal disease) ([place]) 12/15/2021   Essential hypertension 12/15/2021   Diabetes mellitus ([place]) 12/15/2021   CKD (chronic kidney disease), stage IV ([place]) 12/08/2021   AKI (acute kidney injury) ([place]) 12/08/2021   MGUS (monoclonal gammopathy of unknown significance) 08/06/2019   Anemia 08/06/2019   PCP:  Medicine, [place]:   [facility] [place], [address] [place] [address] Phone: [phone] Fax: [phone]     Social Determinants of Health (SDOH) Interventions  Readmission Risk Interventions     No data to display

## 2022-09-21 NOTE — Progress Notes (Signed)
PROGRESS NOTE    Randy Ross  ASN:[id number] DOB: [birth year] DOA: 08/24/2022 PCP: Medicine, [facility]    Brief Narrative:   Randy Ross is a 51 y.o. male with past medical history significant for ESRD on HD, DM2, essential hypertension who presented to [facility] ED with sudden onset slurred speech, headache.  Workup in the ER with elevated BP of 260/140 and CT head showing cerebellar hemorrhage.  Patient was initially intubated in the ED for airway protection and admitted to the PCCM/neurology service.  Significant Hospital events: 11/14: Admitted for acute cerebellar hemorrhage; intubated in the ER, started on hypertonic saline 11/15: Neurosurgery were consulted, recommended against surgical evacuation 11/16: Cefepime started for suspected aspiration 11/17: Required 1u PRBC transfusion due to CRRT filter 11/19: EEG nonspecific 11/21: MRI brain shows new scattered bilateral acute infarcts 11/22: Completed 7 days antibiotics 11/24: IR consulted given recirculation syndrome, high BUN; plan for fistulogram pending 11/27: Temp cath HD cath placed  11/30: Self-extubated 12/1: Persistent delirium 12/2: Transferred out of ICU to [facility] service 12/3: Pulled out cortrak 12/4: Failed SLP, needs cortrak, fistulogram shows poor outflow 12/5: TDC placed, feeding tube replaced 12/6: HD catheter placed by IR 12/9: Family meeting with palliative care, family to decide on possible transitioning to comfort measures  12/11: Family decided to continue aggressive measures, IR consulted for PEG tube 12/12: SLP requesting MBS prior to PEG tube placement; MBS likely plan for 12/12   Assessment & Plan:   Acute Cerebellar hemorrhage 2/2 poorly controlled hypertension Cerebral edema Patient presenting to ED via EMS with mental status change, left-sided weakness, slurred speech and left-sided gaze preference.  Patient was noted to be severely hypertensive on admission.  Patient  was subsequently intubated for airway protection and CT notable for IPH right cerebellar hemisphere with mass effect.  Patient was started on Cleviprex, Versed and admitted under the critical care/neurology service.  Patient was seen by neurosurgery who did not recommend surgical intervention; and patient was started on hypertonic saline and placed on CRRT to minimize rapid fluid shifts. -- Continues with poor progression in terms of mental status, repeat CT head x 3 remained stable overall very poor prognosis and palliative care following.  Neurology now signed off.  And CRRT converted back to HD per nephrology.  Family meeting planned for 12/9; family now to discuss amongst themselves whether transitioning to comfort measures versus need for PEG tube and SNF placement.  Dysphagia Etiology low please secondary to persistent encephalopathy, patient has pulled out core track Randy Ross to, replaced today.  Palliative care following, plan for family meeting on 12/9 and if no progression towards oral intake and family still wanting aggressive measures will need PEG tube placed. -- Continue tube feeds via core track  -- SLP to repeat MBS 12/13 -- IR consulted for PEG tube placement -- Aspiration precautions  Lower GI bleeding: Resolved Noted by nurse, transient, no change in hemoglobin.  No further bleeding reported. 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  TTE with LVEF 50-55%.  LDL 91, hemoglobin A1c 5.9.  Holding antiplatelet for anemia, cerebellar hemorrhage.  Neurology now signed off; and do not recommend further evaluation for cardiac source of embolism as he is not a good long-term anticoagulation candidate.  Hypertensive emergency Hx essential hypertension BP severely elevated on admission, likely leading to acute cerebellar hemorrhage. --  Amlodipine 5 mg per  tube twice daily -- Carvedilol 25 mg per tube twice daily -- Hydralazine 100 mg per tube every 8 hours  Acute respiratory failure with hypoxia: Resolved Aspiration pneumonia Patient was initially intubated for airway protection upon ED arrival, patient self-extubated on 11/30.  Completed course of antibiotics with cefepime followed by ceftriaxone.  Acute metabolic encephalopathy Patient continues to not be able to follow commands, likely secondary to brain insult. -- Supportive care, Seroquel  Type 2 diabetes mellitus Hemoglobin A1c 5.9 08/25/2022, well-controlled. -- Semglee 10 units [place] daily -- Moderate SSI for coverage -- CBGs qAC/HS  ESRD on HD -- Continue HD per nephrology  Anemia of chronic medical/renal disease Hemoglobin stable, continue intermittent monitoring.  Transfuse hemoglobin < 7.0  Clotted fistula Fistulogram showed poor outflow from fistula, nephrology consulted IR for HD catheter placement which was performed on 12/6.   DVT prophylaxis: heparin injection 5,000 Units Start: 09/12/22 1445 Place and maintain sequential compression device Start: 08/25/22 1025    Code Status: Full Code Family Communication: No family present at bedside this morning  Disposition Plan:  Level of care: Telemetry Medical Status is: Inpatient Remains inpatient appropriate because: Pending repeat MBS by SLP, may need PEG tube placement, will need SNF placement.    Consultants:  PCCM: Signed off 12/2 Neurology: Signed off 12/4 Neurosurgery Nephrology Interventional radiology Palliative care  Procedures:  HD catheter placement, IR 12/6  Antimicrobials:  Cefepime 11/17 - 11/18 Ceftriaxone 11/19 - 11/22 Cefazolin 12/6 - 12/6   Subjective: Patient seen examined at bedside, resting comfortably.  Lying in bed.  No family present. Remains confused, in restraints, nonconversant.  No acute events overnight per nursing staff.  Palliative care followed up with  family who request continue aggressive measures and will consult IR for PEG tube placement; but SLP he would like repeat MBS prior to this.  Will need SNF placement; TOC working on.  Objective: Vitals:   09/20/22 2057 09/21/22 0040 09/21/22 0409 09/21/22 0809  BP: (!) 141/65 128/73 129/64 137/81  Pulse: 79 72 79 76  Resp: '14 16 16 17  '$ Temp: 98.2 F (36.8 C) 98.2 F (36.8 C) 98.3 F (36.8 C) 97.6 F (36.4 C)  TempSrc: Oral Oral Oral Oral  SpO2: 97% 100% 97% 98%  Weight:      Height:       No intake or output data in the 24 hours ending 09/21/22 1145   Filed Weights   09/16/22 1441 09/18/22 1430 09/18/22 1834  Weight: 73.4 kg 78.2 kg 71.6 kg    Examination:  Physical Exam: GEN: NAD, lying in bed, nonverbal makes eye contact; cortrack in place PULM: CTAB w/o wheezes/crackles, normal respiratory effort on room air CV: RRR w/o M/G/R GI: abd soft, NTND, NABS MSK: no peripheral edema    Data Reviewed: I have personally reviewed following labs and imaging studies  CBC: Recent Labs  Lab 09/15/22 0530 09/17/22 0435  WBC 10.1 10.8*  HGB 10.2* 10.9*  HCT 32.8* 34.3*  MCV 70.7* 70.4*  PLT 614* 161*   Basic Metabolic Panel: Recent Labs  Lab 09/15/22 0530 09/16/22 0634 09/17/22 0435 09/18/22 0251  NA 137  --  130*  --   K 3.5  --  4.3  --   CL 98  --  89*  --   CO2 26  --  26  --   GLUCOSE 100*  --  90  --   BUN 59*  --  35*  --   CREATININE 6.68*  --  5.12*  --   CALCIUM 8.9  --  9.2  --   MG 2.0 2.4 2.0 2.9*   GFR: Estimated Creatinine Clearance: 14.8 mL/min (A) (by C-G formula based on SCr of 5.12 mg/dL (H)). Liver Function Tests: Recent Labs  Lab 09/15/22 0530  AST 30  ALT 43  ALKPHOS 91  BILITOT 1.0  PROT 7.9  ALBUMIN 3.2*   No results for input(s): "LIPASE", "AMYLASE" in the last 168 hours. No results for input(s): "AMMONIA" in the last 168 hours. Coagulation Profile: No results for input(s): "INR", "PROTIME" in the last 168 hours. Cardiac  Enzymes: No results for input(s): "CKTOTAL", "CKMB", "CKMBINDEX", "TROPONINI" in the last 168 hours. BNP (last 3 results) No results for input(s): "PROBNP" in the last 8760 hours. HbA1C: No results for input(s): "HGBA1C" in the last 72 hours. CBG: Recent Labs  Lab 09/20/22 0637 09/20/22 1136 09/20/22 1752 09/21/22 0044 09/21/22 0600  GLUCAP 142* 143* 151* 141* 169*   Lipid Profile: Recent Labs    09/20/22 0639  TRIG 160*   Thyroid Function Tests: No results for input(s): "TSH", "T4TOTAL", "FREET4", "T3FREE", "THYROIDAB" in the last 72 hours. Anemia Panel: No results for input(s): "VITAMINB12", "FOLATE", "FERRITIN", "TIBC", "IRON", "RETICCTPCT" in the last 72 hours. Sepsis Labs: No results for input(s): "PROCALCITON", "LATICACIDVEN" in the last 168 hours.  No results found for this or any previous visit (from the past 240 hour(s)).       Radiology Studies: CT ABDOMEN WO CONTRAST  Result Date: 09/21/2022 CLINICAL DATA:  Evaluate for percutaneous gastrostomy tube placement EXAM: CT ABDOMEN WITHOUT CONTRAST TECHNIQUE: Multidetector CT imaging of the abdomen was performed following the standard protocol without IV contrast. RADIATION DOSE REDUCTION: This exam was performed according to the departmental dose-optimization program which includes automated exposure control, adjustment of the mA and/or kV according to patient size and/or use of iterative reconstruction technique. COMPARISON:  None Available. FINDINGS: Lower chest: Mild atelectasis in the RIGHT lower lobe RIGHT middle lobe. No pleural fluid small focus of airspace disease in the most inferior RIGHT lower lobe (image 20/5) Hepatobiliary: No focal hepatic lesion on noncontrast exam. Gallbladder normal. Pancreas: Normal pancreatic parenchymal intensity. No ductal dilatation or inflammation. Spleen: Normal spleen. Adrenals/urinary tract: Adrenal glands limited view of the kidney is unremarkable. Stomach/Bowel: Feeding tube  extends into the stomach with tip in the duodenal bulb. Stomach in normal orientation with the gastric antrum approaching the ventral abdominal wall. Vascular/Lymphatic: Abdominal aortic normal caliber. No retroperitoneal periportal lymphadenopathy. Musculoskeletal: No aggressive osseous lesion IMPRESSION: 1. Stomach in typical anatomic position. 2. Feeding tube with tip in the first portion duodenum. 3. Very small focus of infection or pneumonitis at the RIGHT lung base. Electronically Signed   By: Suzy Bouchard M.D.   On: 09/21/2022 09:07        Scheduled Meds:  amLODipine  5 mg Per Tube BID   carvedilol  25 mg Per Tube BID WC   Chlorhexidine Gluconate Cloth  6 each Topical Q0600   darbepoetin (ARANESP) injection - DIALYSIS  150 mcg Subcutaneous Q Mon-1800   feeding supplement (PROSource TF20)  60 mL Per Tube Daily   heparin injection (subcutaneous)  5,000 Units Subcutaneous Q8H   hydrALAZINE  100 mg Per Tube Q8H   insulin aspart  0-15 Units Subcutaneous Q6H   insulin glargine-yfgn  10 Units Subcutaneous Daily   multivitamin  1 tablet Per Tube QHS   nutrition supplement (JUVEN)  1 packet Per Tube BID BM   mouth rinse  15 mL Mouth Rinse 4 times per day   pantoprazole (PROTONIX) IV  40 mg Intravenous Q12H   QUEtiapine  50 mg Per NG tube BID   sevelamer carbonate  2.4 g Per Tube TID WC   Continuous Infusions:  sodium chloride Stopped (08/30/22 0033)   feeding supplement (NEPRO CARB STEADY) 1,000 mL (09/21/22 1110)     LOS: 28 days    Time spent: 45 minutes spent on chart review, discussion with nursing staff, consultants, updating family and interview/physical exam; more than 50% of that time was spent in counseling and/or coordination of care.    Gretchen Weinfeld J [place] DO [facility] Available via Epic secure chat 7am-7pm After these hours, please refer to coverage provider listed on [email or web address] 09/21/2022, 11:45 AM

## 2022-09-21 NOTE — Progress Notes (Signed)
Speech Language Pathology Treatment: Dysphagia;Cognitive-Linquistic  Patient Details Name: Randy Ross MRN: [id number] DOB: 08-16-1971 Today's Date: 09/21/2022 Time: 5397-6734 SLP Time Calculation (min) (ACUTE ONLY): 20 min  Assessment / Plan / Recommendation Clinical Impression  Pt demonstrates significant improvement in arousal and cognition. Pt able to sit upright, say his name, his brother name, "I want a shower" and other comments. He followed about 50% of one step commands and problem solved opening a container of puree with moderate verbal and visual cues. Pt took sips of water followed by coughing, but also fed himself bites of puree without immediate signs of aspiration. Pt ready for instrumental assessment of swallowing; prognosis for diet initiation very good.   HPI HPI: Pt is a 51 y.o. male who presented with sudden onset slurred speech, chest pain, left sided weakness. Surrounding edema in the face of the fourth ventricle without hydrocephalus. 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 ETT 11/14-11/30 (self-extubated). Palliative meeting 12/1: full scope. PMH: end-stage renal disease on intermittent hemodialysis, diabetes, hypertension.      SLP Plan  MBS      Recommendations for follow up therapy are one component of a multi-disciplinary discharge planning process, led by the attending physician.  Recommendations may be updated based on patient status, additional functional criteria and insurance authorization.    Recommendations  Diet recommendations: NPO                Oral Care Recommendations: Oral care QID Follow Up Recommendations: Skilled nursing-short term rehab (<3 hours/day) SLP Visit Diagnosis: Dysphagia, unspecified (R13.10) Plan: MBS           Betul Brisky, Katherene Ponto  09/21/2022, 11:10 AM

## 2022-09-21 NOTE — Progress Notes (Signed)
[facility] Progress Note   Subjective:   seen in room, more interactive, said his name to one of the PT team members. Drinking from a straw held by OT staff.   Objective Vitals:   09/21/22 0409 09/21/22 0809 09/21/22 1147 09/21/22 1148  BP: 129/64 137/81 (!) 140/74 (!) 140/74  Pulse: 79 76 74 74  Resp: '16 17 19 19  '$ Temp: 98.3 F (36.8 C) 97.6 F (36.4 C) 97.7 F (36.5 C) 97.7 F (36.5 C)  TempSrc: Oral Oral Oral Oral  SpO2: 97% 98% 99% 98%  Weight:      Height:       Physical Exam General: awake, no distress Heart: RRR, no murmurs, rubs or gallops Lungs: CTA anteriorly Abdomen: Soft, non-distended, +BS Extremities: no edema b/l lower extremties Neuro: as above Dialysis Access:  LUE AVF + bruit, R IJ Tunneled  HD catheter c/d/i    Dialysis Orders: SW TTS (while here, OP is MWF) 3h 14mn  80kg   400/800   2/2 bath  Hep 2000  LFA AVF - last HD 11/13, post 80.1kg - mircera 150 q2, last 11/13 - hectorol 438m IV q HD  Assessment/Plan: 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 S/P course of hypertonic fluids.  Mental status remains poor.  HTN/ vol  - getting amlodipine/ coreg/amlodipine per tube, also has PRNs ordered. No edema on exam, BP's good. New lower dry wt may be ~72kg.  AMS - due to CVA/ bleed. As above.  ESRD - pt required CRRT 11/15-11/17. Now is getting HD TTS which is off schedule but helps w/ our staffing. HD today.  HD access - BUN was very high so access recirc was suspected. IR did fistulogram 12/4 and noted to be small with competing veins. [facility] by [facility] on 12/6.  Anemia of ESRD: Hgb 9s, continue Aranesp 15020mweekly while here. Secondary HPTH: CCa ok. renvela per NG tube now at goal. Continue VDRA.  DM2 - per pmd  GOC - full code still. Poor cognitive function sp brain bleed. Seems to be improving some.    RobKelly SplinterD 09/21/2022, 12:21 PM  Recent Labs  Lab 09/15/22 0530  09/17/22 0435  HGB 10.2* 10.9*  ALBUMIN 3.2*  --   CALCIUM 8.9 9.2  CREATININE 6.68* 5.12*  K 3.5 4.3     Inpatient medications:  amLODipine  5 mg Per Tube BID   carvedilol  25 mg Per Tube BID WC   Chlorhexidine Gluconate Cloth  6 each Topical Q0600   darbepoetin (ARANESP) injection - DIALYSIS  150 mcg Subcutaneous Q Mon-1800   feeding supplement (PROSource TF20)  60 mL Per Tube Daily   heparin injection (subcutaneous)  5,000 Units Subcutaneous Q8H   hydrALAZINE  100 mg Per Tube Q8H   insulin aspart  0-15 Units Subcutaneous Q6H   insulin glargine-yfgn  10 Units Subcutaneous Daily   multivitamin  1 tablet Per Tube QHS   nutrition supplement (JUVEN)  1 packet Per Tube BID BM   mouth rinse  15 mL Mouth Rinse 4 times per day   pantoprazole (PROTONIX) IV  40 mg Intravenous Q12H   QUEtiapine  50 mg Per NG tube BID   sevelamer carbonate  2.4 g Per Tube TID WC    sodium chloride Stopped (08/30/22 0033)   feeding supplement (NEPRO CARB STEADY) 1,000 mL (09/21/22 1110)   sodium chloride, acetaminophen (TYLENOL) oral liquid 160 mg/5 mL, bisacodyl, fentaNYL (SUBLIMAZE) injection, hydrALAZINE,  labetalol, LORazepam, ondansetron (ZOFRAN) IV, mouth rinse

## 2022-09-21 NOTE — [place] FL2 (Signed)
[place] MEDICAID FL2 LEVEL OF CARE FORM     IDENTIFICATION  Patient Name: Randy Ross Birthdate: 08-17-1971 Sex: male Admission Date (Current Location): 08/24/2022  [facility] and [place] Number:  Herbalist and Address:  [facility] [place] [address] [place], [place] [address]      Provider Number: [id number]  Attending Physician Name and Address:  [place] Eric J, DO  Relative Name and Phone Number:  Para March, [phone]    Current Level of Care: Hospital Recommended Level of Care: [place] Prior Approval Number:    Date Approved/Denied:   PASRR Number: [id number]  Discharge Plan: SNF    Current Diagnoses: Patient Active Problem List   Diagnosis Date Noted   Palliative care by specialist 09/16/2022   Cerebral edema ([place]) 60/73/7106   Acute embolic stroke ([place]) 26/94/8546   Lower GI bleeding 09/12/2022   Dysphagia 09/12/2022   Gastrointestinal hemorrhage 27/12/5007   Acute metabolic encephalopathy and delirium 09/10/2022   Pressure injury of skin 09/01/2022   Acute respiratory failure with hypoxia ([place]) 08/25/2022   Hypertensive emergency 08/25/2022   Encounter for continuous renal replacement therapy (CRRT) for acute renal failure ([place]) 08/25/2022   Acute cerebellar hemorrhage ([place]) 08/25/2022   ESRD (end stage renal disease) ([place]) 12/15/2021   Essential hypertension 12/15/2021   Diabetes mellitus ([place]) 12/15/2021   CKD (chronic kidney disease), stage IV ([place]) 12/08/2021   AKI (acute kidney injury) ([place]) 12/08/2021   MGUS (monoclonal gammopathy of unknown significance) 08/06/2019   Anemia 08/06/2019    Orientation RESPIRATION BLADDER Height & Weight        Normal Incontinent, External catheter Weight: 157 lb 13.6 oz (71.6 kg) Height:  '5\' 5"'$  (165.1 cm)  BEHAVIORAL SYMPTOMS/MOOD NEUROLOGICAL BOWEL NUTRITION STATUS      Incontinent  (Peg)  AMBULATORY STATUS COMMUNICATION OF NEEDS Skin   Extensive  Assist Non-Verbally Normal                       Personal Care Assistance Level of Assistance  Bathing, Feeding, Dressing Bathing Assistance: Maximum assistance Feeding assistance: Maximum assistance (Peg) Dressing Assistance: Maximum assistance     Functional Limitations Info  Sight, Hearing, Speech Sight Info: Adequate Hearing Info: Adequate Speech Info: Impaired    SPECIAL CARE FACTORS FREQUENCY                       Contractures Contractures Info: Not present    Additional Factors Info  Code Status Code Status Info: Full             Current Medications (09/21/2022):  This is the current hospital active medication list Current Facility-Administered Medications  Medication Dose Route Frequency Provider Last Rate Last Admin   0.9 %  sodium chloride infusion   Intravenous PRN Lorenza Chick, MD   Stopped at 08/30/22 0033   acetaminophen (TYLENOL) 160 MG/5ML solution 650 mg  650 mg Per Tube Q4H PRN Elsie Lincoln, MD   650 mg at 09/19/22 1322   amLODipine (NORVASC) tablet 5 mg  5 mg Per Tube BID Roney Jaffe, MD   5 mg at 09/21/22 1112   bisacodyl (DULCOLAX) suppository 10 mg  10 mg Rectal Daily PRN Janine Ores, NP       carvedilol (COREG) tablet 25 mg  25 mg Per Tube BID WC Janine Ores, NP   25 mg at 09/21/22 0817   Chlorhexidine Gluconate Cloth 2 % PADS 6  each  6 each Topical Q0600 Roney Jaffe, MD   6 each at 09/21/22 0549   Darbepoetin Alfa (ARANESP) injection 150 mcg  150 mcg Subcutaneous Q Mon-1800 Donnamae Jude, RPH   150 mcg at 09/20/22 1751   feeding supplement (NEPRO CARB STEADY) liquid 1,000 mL  1,000 mL Per Tube Continuous Hunsucker, Bonna Gains, MD 50 mL/hr at 09/21/22 1110 1,000 mL at 09/21/22 1110   feeding supplement (PROSource TF20) liquid 60 mL  60 mL Per Tube Daily [place] Eric J, DO   60 mL at 09/21/22 1111   fentaNYL (SUBLIMAZE) injection 50 mcg  50 mcg Intravenous Q2H PRN Cristal Generous, NP   50 mcg at 09/14/22 2124   heparin  injection 5,000 Units  5,000 Units Subcutaneous Marland Kitchen, MD   5,000 Units at 09/21/22 0549   hydrALAZINE (APRESOLINE) injection 20 mg  20 mg Intravenous Q6H PRN Edwin Dada, MD   20 mg at 09/15/22 1497   hydrALAZINE (APRESOLINE) tablet 100 mg  100 mg Per Tube Q8H Dorene Grebe, MD   100 mg at 09/21/22 0550   insulin aspart (novoLOG) injection 0-15 Units  0-15 Units Subcutaneous Q6H Dorene Grebe, MD   3 Units at 09/21/22 0603   insulin glargine-yfgn (SEMGLEE) injection 10 Units  10 Units Subcutaneous Daily Dorene Grebe, MD   10 Units at 09/21/22 0826   labetalol (NORMODYNE) injection 5-20 mg  5-20 mg Intravenous Q10 min PRN Rosalin Hawking, MD   20 mg at 09/15/22 0523   LORazepam (ATIVAN) injection 0.5 mg  0.5 mg Intravenous Q6H PRN [place] Eric J, DO   0.5 mg at 09/21/22 0441   multivitamin (RENA-VIT) tablet 1 tablet  1 tablet Per Tube QHS Juanito Doom, MD   1 tablet at 09/20/22 2131   nutrition supplement (JUVEN) (JUVEN) powder packet 1 packet  1 packet Per Tube BID BM Hunsucker, Bonna Gains, MD   1 packet at 09/21/22 1111   ondansetron (ZOFRAN) injection 4 mg  4 mg Intravenous Q6H PRN Rosalin Hawking, MD   4 mg at 09/07/22 2134   Oral care mouth rinse  15 mL Mouth Rinse 4 times per day Rosalin Hawking, MD   15 mL at 09/21/22 0263   Oral care mouth rinse  15 mL Mouth Rinse PRN Rosalin Hawking, MD       pantoprazole (PROTONIX) injection 40 mg  40 mg Intravenous Cordella Register, MD   40 mg at 09/21/22 1112   QUEtiapine (SEROQUEL) tablet 50 mg  50 mg Per NG tube BID Dorene Grebe, MD   50 mg at 09/21/22 1111   sevelamer carbonate (RENVELA) powder PACK 2.4 g  2.4 g Per Tube TID WC Collins, Samantha G, PA-C   2.4 g at 09/21/22 1112     Discharge Medications: Please see discharge summary for a list of discharge medications.  Relevant Imaging Results:  Relevant Lab Results:   Additional Information SS #: [id number]  Jinger Neighbors, LCSW

## 2022-09-21 NOTE — Progress Notes (Signed)
Physical Therapy Treatment Patient Details Name: Randy Ross MRN: [id number] DOB: 09/26/1971 Today's Date: 09/21/2022   History of Present Illness 51 y.o. male presented 08/24/22 with slurred speech and headache. Head CT Rt cerebellar hemorrhage; intubated 11/14, self-extubated 11/30; CRRT; MRI brain 11/21 many small acute infarcts; PMH significant for end-stage renal disease on intermittent hemodialysis, diabetes, hypertension    PT Comments    Patient seen, however very lethargic from earlier seroquel (~2 hours prior). Noted per chart that he has been much more alert and talking.  He even followed 50% of commands with speech therapy and they felt appropriate for a swallow study. Based on these documented changes, will continue PT trial and increased treatment frequency to 2x/week. Discharge plan updated based on expectation that pt will be able to participate with therapies.    Recommendations for follow up therapy are one component of a multi-disciplinary discharge planning process, led by the attending physician.  Recommendations may be updated based on patient status, additional functional criteria and insurance authorization.  Follow Up Recommendations  Skilled nursing-short term rehab (<3 hours/day) Can patient physically be transported by private vehicle: No   Assistance Recommended at Discharge Frequent or constant Supervision/Assistance  Patient can return home with the following Two people to help with walking and/or transfers;Two people to help with bathing/dressing/bathroom;Assistance with cooking/housework;Assistance with feeding;Direct supervision/assist for medications management;Direct supervision/assist for financial management;Assist for transportation;Help with stairs or ramp for entrance   Equipment Recommendations  None recommended by PT    Recommendations for Other Services       Precautions / Restrictions Precautions Precautions: Fall;Other  (comment) Precaution Comments: pulled out multiple cortraks Restrictions Weight Bearing Restrictions: No     Mobility  Bed Mobility               General bed mobility comments: pt too lethargic to attempt EOB; not responding to painful stimuli    Transfers                        Ambulation/Gait                   Stairs             Wheelchair Mobility    Modified Rankin (Stroke Patients Only)       Balance                                            Cognition Arousal/Alertness: Lethargic, Suspect due to medications (pt given Seroquel ~ 2hrs prior) Behavior During Therapy: Restless Overall Cognitive Status: Difficult to assess                                 General Comments: lethargic; not verbal; not following commands; spontaneously moving all 4 extremities (bil UEs in restraints due to h/o pulling out cortrak)        Exercises      General Comments        Pertinent Vitals/Pain Pain Assessment Pain Assessment: Faces Faces Pain Scale: No hurt    Home Living                          Prior Function            PT Goals (  current goals can now be found in the care plan section) Acute Rehab PT Goals Patient Stated Goal: unable PT Goal Formulation: Patient unable to participate in goal setting Time For Goal Achievement: 09/29/22 Potential to Achieve Goals: Poor    Frequency    Min 2X/week (trial)      PT Plan      Co-evaluation              AM-PAC PT "6 Clicks" Mobility   Outcome Measure  Help needed turning from your back to your side while in a flat bed without using bedrails?: Total Help needed moving from lying on your back to sitting on the side of a flat bed without using bedrails?: Total Help needed moving to and from a bed to a chair (including a wheelchair)?: Total Help needed standing up from a chair using your arms (e.g., wheelchair or bedside chair)?:  Total Help needed to walk in hospital room?: Total Help needed climbing 3-5 steps with a railing? : Total 6 Click Score: 6    End of Session   Activity Tolerance: Patient limited by lethargy Patient left: in bed;with bed alarm set;with restraints reapplied;Other (comment) (bil wrist restraints and mitts; transport present to take to HD)   PT Visit Diagnosis: Other abnormalities of gait and mobility (R26.89);Other symptoms and signs involving the nervous system (R29.898)     Time: 7322-0254 PT Time Calculation (min) (ACUTE ONLY): 8 min  Charges:  $Neuromuscular Re-education: 8-22 mins                      [place]  Office [phone]    [name] 09/21/2022, 2:17 PM

## 2022-09-21 NOTE — Progress Notes (Signed)
Pt tx ended tolerated tx well, vs stable, uf goal reached, pt sent back to unit     09/21/22 1751  Vitals  Temp (!) 97.4 F (36.3 C)  BP 118/73  MAP (mmHg) 85  BP Location Right Arm  BP Method Automatic  Patient Position (if appropriate) Lying  Pulse Rate 74  Pulse Rate Source Monitor  Resp (!) 21  Oxygen Therapy  SpO2 100 %  O2 Device Room Air  Patient Activity (if Appropriate) In bed  During Treatment Monitoring  Intra-Hemodialysis Comments Tx completed  Post Treatment  Dialyzer Clearance Lightly streaked  Duration of HD Treatment -hour(s) 3 hour(s)  Liters Processed 63  Fluid Removed (mL) 1300 mL  Tolerated HD Treatment Yes  Post-Hemodialysis Comments HD tx completed no complication pt sent back to unit  Hemodialysis Catheter Right Subclavian Double lumen Permanent (Tunneled)  Placement Date/Time: 09/15/22 0907   Serial / Lot #: [id number]  Expiration Date: 04/26/27  Time Out: Correct patient;Correct site;Correct procedure  Maximum sterile barrier precautions: Hand hygiene;Cap;Mask;Sterile gown;Sterile gloves;Large sterile ...  Site Condition No complications  Blue Lumen Status Heparin locked;Dead end cap in place  Red Lumen Status Dead end cap in place;Heparin locked  Purple Lumen Status N/A  Catheter fill solution Heparin 1000 units/ml  Catheter fill volume (Arterial) 1.6 cc  Catheter fill volume (Venous) 1.6  Dressing Type Transparent  Dressing Status Antimicrobial disc in place  Drainage Description None  Dressing Change Due 09/25/22  Post treatment catheter status Capped and Clamped

## 2022-09-21 NOTE — Plan of Care (Signed)
  Problem: Activity: Goal: Ability to tolerate increased activity will improve Outcome: Not Progressing   Problem: Respiratory: Goal: Ability to maintain a clear airway and adequate ventilation will improve Outcome: Not Progressing   Problem: Role Relationship: Goal: Method of communication will improve Outcome: Not Progressing   Problem: Education: Goal: Ability to describe self-care measures that may prevent or decrease complications (Diabetes Survival Skills Education) will improve Outcome: Not Progressing Goal: Individualized Educational Video(s) Outcome: Not Progressing   Problem: Coping: Goal: Ability to adjust to condition or change in health will improve Outcome: Not Progressing   Problem: Fluid Volume: Goal: Ability to maintain a balanced intake and output will improve Outcome: Not Progressing   Problem: Health Behavior/Discharge Planning: Goal: Ability to identify and utilize available resources and services will improve Outcome: Not Progressing Goal: Ability to manage health-related needs will improve Outcome: Not Progressing   Problem: Metabolic: Goal: Ability to maintain appropriate glucose levels will improve Outcome: Not Progressing   Problem: Nutritional: Goal: Maintenance of adequate nutrition will improve Outcome: Not Progressing Goal: Progress toward achieving an optimal weight will improve Outcome: Not Progressing   Problem: Skin Integrity: Goal: Risk for impaired skin integrity will decrease Outcome: Not Progressing   Problem: Tissue Perfusion: Goal: Adequacy of tissue perfusion will improve Outcome: Not Progressing   Problem: Safety: Goal: Non-violent Restraint(s) Outcome: Not Progressing   Problem: Education: Goal: Knowledge of General Education information will improve Description: Including pain rating scale, medication(s)/side effects and non-pharmacologic comfort measures Outcome: Not Progressing   Problem: Health  Behavior/Discharge Planning: Goal: Ability to manage health-related needs will improve Outcome: Not Progressing   Problem: Clinical Measurements: Goal: Ability to maintain clinical measurements within normal limits will improve Outcome: Not Progressing Goal: Will remain free from infection Outcome: Not Progressing Goal: Diagnostic test results will improve Outcome: Not Progressing Goal: Respiratory complications will improve Outcome: Not Progressing Goal: Cardiovascular complication will be avoided Outcome: Not Progressing   Problem: Activity: Goal: Risk for activity intolerance will decrease Outcome: Not Progressing   Problem: Nutrition: Goal: Adequate nutrition will be maintained Outcome: Not Progressing   Problem: Coping: Goal: Level of anxiety will decrease Outcome: Not Progressing   Problem: Elimination: Goal: Will not experience complications related to bowel motility Outcome: Not Progressing Goal: Will not experience complications related to urinary retention Outcome: Not Progressing   Problem: Pain Managment: Goal: General experience of comfort will improve Outcome: Not Progressing   Problem: Safety: Goal: Ability to remain free from injury will improve Outcome: Not Progressing   Problem: Skin Integrity: Goal: Risk for impaired skin integrity will decrease Outcome: Not Progressing   Problem: Education: Goal: Knowledge of disease or condition will improve Outcome: Not Progressing Goal: Knowledge of secondary prevention will improve (MUST DOCUMENT ALL) Outcome: Not Progressing Goal: Knowledge of patient specific risk factors will improve [place] N/A or DELETE if not current risk factor) Outcome: Not Progressing

## 2022-09-22 ENCOUNTER — Inpatient Hospital Stay (HOSPITAL_COMMUNITY): Payer: No Typology Code available for payment source

## 2022-09-22 DIAGNOSIS — K922 Gastrointestinal hemorrhage, unspecified: Secondary | ICD-10-CM

## 2022-09-22 DIAGNOSIS — G936 Cerebral edema: Secondary | ICD-10-CM

## 2022-09-22 LAB — GLUCOSE, CAPILLARY
Glucose-Capillary: 111 mg/dL — ABNORMAL HIGH (ref 70–99)
Glucose-Capillary: 117 mg/dL — ABNORMAL HIGH (ref 70–99)
Glucose-Capillary: 121 mg/dL — ABNORMAL HIGH (ref 70–99)
Glucose-Capillary: 146 mg/dL — ABNORMAL HIGH (ref 70–99)
Glucose-Capillary: 149 mg/dL — ABNORMAL HIGH (ref 70–99)
Glucose-Capillary: 153 mg/dL — ABNORMAL HIGH (ref 70–99)
Glucose-Capillary: 156 mg/dL — ABNORMAL HIGH (ref 70–99)
Glucose-Capillary: 157 mg/dL — ABNORMAL HIGH (ref 70–99)
Glucose-Capillary: 95 mg/dL (ref 70–99)

## 2022-09-22 LAB — BASIC METABOLIC PANEL
Anion gap: 14 (ref 5–15)
BUN: 79 mg/dL — ABNORMAL HIGH (ref 6–20)
CO2: 25 mmol/L (ref 22–32)
Calcium: 9.3 mg/dL (ref 8.9–10.3)
Chloride: 91 mmol/L — ABNORMAL LOW (ref 98–111)
Creatinine, Ser: 6.6 mg/dL — ABNORMAL HIGH (ref 0.61–1.24)
GFR, Estimated: 9 mL/min — ABNORMAL LOW (ref >60)
Glucose, Bld: 108 mg/dL — ABNORMAL HIGH (ref 70–99)
Potassium: 3.9 mmol/L (ref 3.5–5.1)
Sodium: 130 mmol/L — ABNORMAL LOW (ref 135–145)

## 2022-09-22 LAB — CBC
HCT: 32.7 % — ABNORMAL LOW (ref 39.0–52.0)
Hemoglobin: 10.7 g/dL — ABNORMAL LOW (ref 13.0–17.0)
MCH: 22.2 pg — ABNORMAL LOW (ref 26.0–34.0)
MCHC: 32.7 g/dL (ref 30.0–36.0)
MCV: 68 fL — ABNORMAL LOW (ref 80.0–100.0)
Platelets: 324 10*3/uL (ref 150–400)
RBC: 4.81 MIL/uL (ref 4.22–5.81)
RDW: 19.9 % — ABNORMAL HIGH (ref 11.5–15.5)
WBC: 11.3 10*3/uL — ABNORMAL HIGH (ref 4.0–10.5)
nRBC: 0 % (ref 0.0–0.2)

## 2022-09-22 MED ORDER — CHLORHEXIDINE GLUCONATE CLOTH 2 % EX PADS
6.0000 | MEDICATED_PAD | Freq: Every day | CUTANEOUS | Status: DC
  Administered 2022-09-22 – 2022-09-28 (×6): 6 via TOPICAL

## 2022-09-22 NOTE — Plan of Care (Signed)
  Problem: Activity: Goal: Ability to tolerate increased activity will improve Outcome: Progressing   Problem: Respiratory: Goal: Ability to maintain a clear airway and adequate ventilation will improve Outcome: Progressing   Problem: Role Relationship: Goal: Method of communication will improve Outcome: Progressing   Problem: Education: Goal: Ability to describe self-care measures that may prevent or decrease complications (Diabetes Survival Skills Education) will improve Outcome: Progressing Goal: Individualized Educational Video(s) Outcome: Progressing   Problem: Coping: Goal: Ability to adjust to condition or change in health will improve Outcome: Progressing   Problem: Fluid Volume: Goal: Ability to maintain a balanced intake and output will improve Outcome: Progressing   Problem: Nutritional: Goal: Maintenance of adequate nutrition will improve Outcome: Progressing Goal: Progress toward achieving an optimal weight will improve Outcome: Progressing   Problem: Skin Integrity: Goal: Risk for impaired skin integrity will decrease Outcome: Progressing   Problem: Tissue Perfusion: Goal: Adequacy of tissue perfusion will improve Outcome: Progressing   Problem: Education: Goal: Knowledge of General Education information will improve Description: Including pain rating scale, medication(s)/side effects and non-pharmacologic comfort measures Outcome: Progressing   Problem: Health Behavior/Discharge Planning: Goal: Ability to manage health-related needs will improve Outcome: Progressing   Problem: Clinical Measurements: Goal: Ability to maintain clinical measurements within normal limits will improve Outcome: Progressing Goal: Will remain free from infection Outcome: Progressing Goal: Diagnostic test results will improve Outcome: Progressing Goal: Respiratory complications will improve Outcome: Progressing Goal: Cardiovascular complication will be avoided Outcome:  Progressing   Problem: Activity: Goal: Risk for activity intolerance will decrease Outcome: Progressing   Problem: Nutrition: Goal: Adequate nutrition will be maintained Outcome: Progressing   Problem: Elimination: Goal: Will not experience complications related to bowel motility Outcome: Progressing Goal: Will not experience complications related to urinary retention Outcome: Progressing   Problem: Pain Managment: Goal: General experience of comfort will improve Outcome: Progressing   Problem: Safety: Goal: Ability to remain free from injury will improve Outcome: Progressing   Problem: Skin Integrity: Goal: Risk for impaired skin integrity will decrease Outcome: Progressing

## 2022-09-22 NOTE — Progress Notes (Signed)
[facility] Progress Note   Subjective:   seen in room  Objective Vitals:   09/21/22 2148 09/22/22 0009 09/22/22 0329 09/22/22 0744  BP: 135/69 132/71 128/63 (!) 146/69  Pulse: 74 71 67 74  Resp:  '16 14 17  '$ Temp:  98.6 F (37 C) 99 F (37.2 C) 98 F (36.7 C)  TempSrc:  Oral Axillary Oral  SpO2: 100% 97% 97% 99%  Weight:      Height:       Physical Exam General: awake, no distress Heart: RRR, no murmurs, rubs or gallops Lungs: CTA anteriorly Abdomen: Soft, non-distended, +BS Extremities: no edema b/l lower extremties Neuro: as above Dialysis Access:  LUE AVF + bruit, R IJ Tunneled  HD catheter c/d/i    Dialysis Orders: SW TTS (while here, OP is MWF) 3h 57mn  80kg   400/800   2/2 bath  Hep 2000  LFA AVF - last HD 11/13, post 80.1kg - mircera 150 q2, last 11/13 - hectorol 448m IV q HD  Assessment/Plan: 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 S/P course of hypertonic fluids.  Mental status remains poor.  HTN/ vol  - getting amlodipine/ coreg/amlodipine per tube, also has PRNs ordered. No edema on exam, BP's good. New lower dry wt may be ~72kg. Min UF w/ HD.  AMS - due to CVA/ bleed. As above.  ESRD - pt required CRRT 11/15-11/17, now is doing iHD TTS which is off schedule. HD tomorrow.  HD access - BUN was very high so access recirc was suspected. IR did fistulogram 12/4 and AVF was found be small with competing veins. [facility] by [facility] on 12/6.  Anemia of ESRD: Hgb 9s, continue Aranesp 15020mweekly while here. Secondary HPTH: CCa ok. renvela per NG tube now at goal. Continue VDRA.  DM2 - per pmd  GOC - full code still. Poor cognitive function sp brain bleed. Seems to be improving some.    RobKelly SplinterD 09/22/2022, 10:13 AM  Recent Labs  Lab 09/21/22 1441 09/21/22 1500 09/22/22 0343  HGB 10.6*  --  10.7*  ALBUMIN  --  3.2*  --   CALCIUM  --  9.8 9.3  PHOS  --  5.2*  --   CREATININE  --  10.17*  6.60*  K  --  4.6 3.9     Inpatient medications:  amLODipine  5 mg Per Tube BID   carvedilol  25 mg Per Tube BID WC   Chlorhexidine Gluconate Cloth  6 each Topical Q0600   darbepoetin (ARANESP) injection - DIALYSIS  150 mcg Subcutaneous Q Mon-1800   feeding supplement (PROSource TF20)  60 mL Per Tube Daily   heparin injection (subcutaneous)  5,000 Units Subcutaneous Q8H   hydrALAZINE  100 mg Per Tube Q8H   insulin aspart  0-15 Units Subcutaneous Q6H   insulin glargine-yfgn  10 Units Subcutaneous Daily   multivitamin  1 tablet Per Tube QHS   nutrition supplement (JUVEN)  1 packet Per Tube BID BM   mouth rinse  15 mL Mouth Rinse 4 times per day   pantoprazole (PROTONIX) IV  40 mg Intravenous Q12H   QUEtiapine  50 mg Per NG tube BID   sevelamer carbonate  2.4 g Per Tube TID WC    sodium chloride Stopped (08/30/22 0033)   feeding supplement (NEPRO CARB STEADY) 50 mL/hr at 09/22/22 0358   sodium chloride, acetaminophen (TYLENOL) oral liquid 160 mg/5 mL, bisacodyl, fentaNYL (SUBLIMAZE)  injection, hydrALAZINE, labetalol, LORazepam, ondansetron (ZOFRAN) IV, mouth rinse

## 2022-09-22 NOTE — Progress Notes (Signed)
Modified Barium Swallow Progress Note  Patient Details  Name: Randy Ross MRN: [id number] Date of Birth: 1971-02-03  Today's Date: 09/22/2022  Modified Barium Swallow completed.  Full report located under Chart Review in the Imaging Section.  Brief recommendations include the following:  Clinical Impression  The study was suboptimal since pt's frequent movement facilitated the some swallows being missed. Pt presents with oropharyngeal dysphagia characterized by weak lingual manipulation, reduced lingual retraction, reduced bolus cohesion, impaired posterior propulsion and a pharyngeal delay. He demonstrated lingual pumping, oral residue, vallecular residue, and significant oral holding which, considering his performance on 12/12, SLP suspects could be at least partly attributed to the barium being less palatable. Penetration (PAS 5) and aspiration (PAS 7) were noted with thin and nectar thick liquids secondary to the pharyngeal delay. Aspiration of nectar thick liquids was eliminated with a single bolus of nectar thick liquids, but this could not be replicated due to pt's unwillingness to swallow subsequent nectar thick boluses. All instances of aspiration were sensed, but coughing was ineffective in expelling aspirated material. A dysphagia 1 diet with honey thick liquids will be initiated at this time; considering pt's consistently good laryngeal sensation, SLP anticipates that a repeat instrumental assessment will not be needed for diet advancement. Pt's performance and SLP recommendations were discussed with Dr. Alfredia Ross since a G-tube was being considered on 12/12. It was agreed that SLP will continue dysphagia treatment, and that adequacy of p.o. intake will be assessed with possible impelementation of a calorie count.   Swallow Evaluation Recommendations       SLP Diet Recommendations: Dysphagia 1 (Puree) solids;Honey thick liquids   Liquid Administration via: Straw;Cup   Medication  Administration: Crushed with puree   Supervision: Full assist for feeding;Full supervision/cueing for compensatory strategies   Compensations: Small sips/bites;Slow rate   Postural Changes: Seated upright at 90 degrees   Oral Care Recommendations: Oral care BID      Randy Ross Randy Ross, Randy Ross, Randy Ross Office number [place] 09/22/2022,2:49 PM

## 2022-09-22 NOTE — Progress Notes (Signed)
Nutrition Follow-up  DOCUMENTATION CODES:  Not applicable  INTERVENTION:  Continue the following: Nepro @ 50 ml/h (1200 ml per day) Prosource TF 1x/d Provides 2204 kcal, 117 gm protein, 876 ml free water daily Consider adjusting to nocturnal if pt has good PO intake DYS 1 diet with honey thick liquids per SLP, automatic trays and feeding assistance 48-h kcal count to monitor intake  NUTRITION DIAGNOSIS:  Inadequate oral intake related to inability to eat as evidenced by NPO status. Remains applicable  GOAL:  Patient will meet greater than or equal to 90% of their needs progressing, TF at goal, diet advanced  MONITOR:  Vent status, Labs, Weight trends, TF tolerance, I & O's  REASON FOR ASSESSMENT:  Consult Calorie Count  ASSESSMENT:  51 year old male who presented to the ED on 11/14 as a Code Stroke with AMS, L sided weakness, slurred speech, L sided gaze preference. PMH of ESRD on HD, DM, HTN. Pt required intubation in the ED. Pt admitted with [place].  11/14 - intubated 11/15 - s/p cortrak placement; tip at gastric antrum or duodenal bulb  11/15 - 11/17: CRRT 11/19 - started iHD 12/3 - pt removed cortrak tube 12/6 - Right IJ placed for HD, new cortrak placed 12/13 - MBS, diet advanced to DYS1/honey thick  Pt unavailable x 2. Out of room for MBS and then receiving pt care. Spoke with SLP, able to have diet advanced but pt was holding food while undergoing study. Hopeful to be able to further advance in the coming days.   MD would like kcal count to determine if pt can meet needs and if tube is needed and/or if long-term feeds should be considered. Discussed with RN and hung envelope on door. Will keep TF continuous tonight but if pt does well with dinner and breakfast, can consider modifying to nocturnal only.   Nutritionally Relevant Medications: Scheduled Meds:  feeding supplement (PROSource TF20)  60 mL Per Tube Daily   insulin aspart  0-15 Units Subcutaneous Q6H    insulin glargine-yfgn  10 Units Subcutaneous Daily   multivitamin  1 tablet Per Tube QHS   nutrition supplement (JUVEN)  1 packet Per Tube BID BM   pantoprazole (PROTONIX) IV  40 mg Intravenous Q12H   sevelamer carbonate  2.4 g Per Tube TID WC   Continuous Infusions:  feeding supplement (NEPRO CARB STEADY) 50 mL/hr at 09/22/22 0358   PRN Meds: bisacodyl, ondansetron  Labs Reviewed: Na 130, chloride 91 BUN 79, creatinine 6.6 Phosphorus 5.2 (12/12) CBG ranges from 117-'182mg'$ /dL over the last 24 hours  NUTRITION - FOCUSED PHYSICAL EXAM: Flowsheet Row Most Recent Value  Orbital Region No depletion  Upper Arm Region Mild depletion  Thoracic and Lumbar Region No depletion  Buccal Region Unable to assess  Temple Region No depletion  Clavicle Bone Region Mild depletion  Clavicle and Acromion Bone Region Mild depletion  Scapular Bone Region No depletion  Dorsal Hand No depletion  Patellar Region No depletion  Anterior Thigh Region No depletion  Posterior Calf Region No depletion  Edema (RD Assessment) None  Hair Reviewed  Eyes Reviewed  Mouth Reviewed  Skin Reviewed  Nails Reviewed    Diet Order:   Diet Order             DIET - DYS 1 Room service appropriate? No; Fluid consistency: Nectar Thick  Diet effective now                   EDUCATION NEEDS:  No  education needs have been identified at this time  Skin:  Skin Assessment: Skin Integrity Issues: Skin Integrity Issues:: Stage II Stage II: sacrum  Last BM:  12/10 - type 6  Height:  Ht Readings from Last 1 Encounters:  08/25/22 '5\' 5"'$  (1.651 m)   Weight:  Wt Readings from Last 1 Encounters:  09/21/22 72.2 kg    Ideal Body Weight:  61.8 kg  BMI:  Body mass index is 26.49 kg/m.  Estimated Nutritional Needs:  Kcal:  2000-2200 kcal/d Protein:  100-125g/d Fluid:  1000 ml + UOP    Ranell Patrick, RD, LDN Clinical Dietitian RD pager # available in [facility]  After hours/weekend pager # available in  [facility]

## 2022-09-22 NOTE — Progress Notes (Signed)
PROGRESS NOTE    Randy Ross  VVO:[id number] DOB: November 24, 1970 DOA: 08/24/2022 PCP: Medicine, [facility]   Brief Narrative:  Randy Ross is a 51 y.o. male with past medical history significant for ESRD on HD, DM2, essential hypertension who presented to [facility] ED with sudden onset slurred speech, headache.  Workup in the ER with elevated BP of 260/140 and CT head showing cerebellar hemorrhage.  Patient was initially intubated in the ED for airway protection and admitted to the PCCM/neurology service.   Significant Hospital events: 11/14: Admitted for acute cerebellar hemorrhage; intubated in the ER, started on hypertonic saline 11/15: Neurosurgery were consulted, recommended against surgical evacuation 11/16: Cefepime started for suspected aspiration 11/17: Required 1u PRBC transfusion due to CRRT filter 11/19: EEG nonspecific 11/21: MRI brain shows new scattered bilateral acute infarcts 11/22: Completed 7 days antibiotics 11/24: IR consulted given recirculation syndrome, high BUN; plan for fistulogram pending 11/27: Temp cath HD cath placed  11/30: Self-extubated 12/1: Persistent delirium 12/2: Transferred out of ICU to [facility] service 12/3: Pulled out cortrak 12/4: Failed SLP, needs cortrak, fistulogram shows poor outflow 12/5: TDC placed, feeding tube replaced 12/6: HD catheter placed by IR 12/9: Family meeting with palliative care, family to decide on possible transitioning to comfort measures  12/11: Family decided to continue aggressive measures, IR consulted for PEG tube 12/12: SLP requesting MBS prior to PEG tube placement; MBS likely plan for 12/12 12/13: MBS done and recommending dysphagia 1 diet with honey thick liquids.  Will see how the patient tolerates p.o. intake and obtain a calorie count prior to initiation of a PEG.  Will continue the Nepro at 50 MLS per hour and Prosource tube feedings once a day as well as considering adjusting if he has  good p.o. intake   Assessment and Plan: Acute Cerebellar hemorrhage 2/2 poorly controlled hypertension Cerebral edema -Patient presenting to ED via EMS with mental status change, left-sided weakness, slurred speech and left-sided gaze preference.   -Patient was noted to be severely hypertensive on admission.   -Patient was subsequently intubated for airway protection and CT notable for IPH right cerebellar hemisphere with mass effect.   -Patient was started on Cleviprex, Versed and admitted under the critical care/neurology service.  Patient was seen by neurosurgery who did not recommend surgical intervention; and patient was started on hypertonic saline and placed on CRRT to minimize rapid fluid shifts. -- Continues with poor progression in terms of mental status, repeat CT head x 3 remained stable overall very poor prognosis and palliative care following.   -Neurology now signed off.   -WBC went from 12.6 -> 11.3 -And CRRT converted back to HD per nephrology.  Family meeting planned for 12/9; family now to discuss amongst themselves whether transitioning to comfort measures versus need for PEG tube and SNF placement. -Will hold off PEG currently given MBS results as below    Dysphagia -Etiology low please secondary to persistent encephalopathy, patient has pulled out core track [name], replaced today.   -Palliative care following, plan for family meeting on 12/9 and if no progression towards oral intake and family still wanting aggressive measures will need PEG tube placed if fails his Diet Trial. -Continue tube feeds via core track  -SLP to repeat MBS 12/13 and now on Dysphagia 1 Puree Solids with Honey Thick Liquids -IR consulted for PEG tube placement -Aspiration precautions -Will trial Calorie Count prior with Diet as above and TF and if fails will need PEG   Lower  GI bleeding: Resolved -Noted by nurse, transient, no change in hemoglobin.  No further bleeding reported. -Continue  intermittent monitoring of hemoglobin -Hgb/Hct went from 10.6/32.0 -> 10.7/32.7 -Continue to monitor for signs and symptoms of bleeding -Repeat CBC in a.m.   Acute embolic CVA -MR brain 81/44 with many new acute infarcts throughout the bilateral frontal and parietal white matter consistent with embolism.   -TTE with LVEF 50-55%.  LDL 91, hemoglobin A1c 5.9. -Holding antiplatelet for anemia, cerebellar hemorrhage. -Neurology now signed off; and do not recommend further evaluation for cardiac source of embolism as he is not a good long-term anticoagulation candidate.   Hypertensive emergency Hx essential hypertension -BP was severely elevated on admission, likely leading to acute cerebellar hemorrhage. -Continuing Amlodipine 5 mg per tube twice daily, Carvedilol 25 mg per tube twice daily, Hydralazine 100 mg per tube every 8 hours -Continue to Monitor BP per Protocol  -Last BP reading was 137/80   Acute respiratory failure with hypoxia: Resolved Aspiration pneumonia -Patient was initially intubated for airway protection upon ED arrival, patient self-extubated on 11/30.   -SpO2: 99 % O2 Flow Rate (L/min): 2 L/min FiO2 (%): 30 % -Completed course of antibiotics with cefepime followed by ceftriaxone.   Acute metabolic encephalopathy -Patient continues to not be able to follow commands, likely secondary to brain insult. -C/w Supportive care, Seroquel   Type 2 diabetes mellitus -Hemoglobin A1c 5.9 08/25/2022, well-controlled. -C/w Semglee 10 units Anderson daily -C/w Moderate SSI for coverage -CBGs qAC/HS; CBGs ranging from 95-153   ESRD on HD -Continue HD per nephrology -Patient's BUN/Cr went from 160/10.17 -> 79/6.60 -Avoid further nephrotoxic medications, contrast dyes, hypotension and dehydration to ensure adequate renal perfusion and will and renally dose medications -Repeat CMP in the a.m.   Anemia of Chronic Medical/renal disease -Hemoglobin stable, continue intermittent  monitoring.  -Transfuse hemoglobin < 7.0 -Patient's hemoglobin/hematocrit went from 10.6/32.0 and is now 10.7/32.7 -Repeat CBC in a.m.  Hyponatremia -Patient's sodium went from 131 is now 130 -Continue to Monitor and Trend and repeat CMP in the AM    Clotted fistula -Fistulogram showed poor outflow from fistula, nephrology consulted IR for HD catheter placement which was performed on 12/6.   DVT prophylaxis: heparin injection 5,000 Units Start: 09/12/22 1445 Place and maintain sequential compression device Start: 08/25/22 1025    Code Status: Full Code Family Communication: No family currently at bedside  Disposition Plan:  Level of care: Telemetry Medical Status is: Inpatient Remains inpatient appropriate because: Will need further clinical improvement and evaluation of calorie count   Consultants:  Nephrology [facility] Neurosurgery Nephrology Interventional radiology Palliative care  Procedures:  She catheter placement, IR 12/6  Antimicrobials:  Anti-infectives (From admission, onward)    Start     Dose/Rate Route Frequency Ordered Stop   09/14/22 1145  ceFAZolin (ANCEF) IVPB 2g/100 mL premix        2 g 200 mL/hr over 30 Minutes Intravenous To Radiology 09/14/22 1123 09/15/22 0905   08/29/22 2000  cefTRIAXone (ROCEPHIN) 2 g in sodium chloride 0.9 % 100 mL IVPB        2 g 200 mL/hr over 30 Minutes Intravenous Every 24 hours 08/29/22 1009 09/01/22 2146   08/28/22 2000  ceFEPIme (MAXIPIME) 1 g in sodium chloride 0.9 % 100 mL IVPB  Status:  Discontinued        1 g 200 mL/hr over 30 Minutes Intravenous Every 24 hours 08/27/22 1009 08/27/22 1016   08/27/22 2000  ceFEPIme (MAXIPIME) 1 g in  sodium chloride 0.9 % 100 mL IVPB  Status:  Discontinued        1 g 200 mL/hr over 30 Minutes Intravenous Every 24 hours 08/27/22 1016 08/29/22 1009   08/26/22 1000  ceFEPIme (MAXIPIME) 2 g in sodium chloride 0.9 % 100 mL IVPB  Status:  Discontinued        2 g 200 mL/hr over 30 Minutes  Intravenous Every 12 hours 08/26/22 0850 08/27/22 1009       Subjective: Seen and examined at bedside he is resting comfortably.  No family currently and remains nonconversant.  No acute events overnight.  MBS done and he is now on a diet.  TOC working on SNF placement.  No other concerns or complaints at this time.  Objective: Vitals:   09/22/22 0329 09/22/22 0744 09/22/22 1158 09/22/22 1604  BP: 128/63 (!) 146/69 120/70 137/80  Pulse: 67 74 71 66  Resp: '14 17 17 16  '$ Temp: 99 F (37.2 C) 98 F (36.7 C) 98.1 F (36.7 C) 97.9 F (36.6 C)  TempSrc: Axillary Oral Oral Oral  SpO2: 97% 99% 98% 99%  Weight:      Height:        Intake/Output Summary (Last 24 hours) at 09/22/2022 1714 Last data filed at 09/22/2022 0358 Gross per 24 hour  Intake 2398.33 ml  Output 1300 ml  Net 1098.33 ml   Filed Weights   09/18/22 1834 09/21/22 1424 09/21/22 1751  Weight: 71.6 kg 74.3 kg 72.2 kg   Examination: Physical Exam:  Constitutional: WN/WD male in no acute distress appears calm and is nonverbal but makes eye contact.  Core track is in place Respiratory: Diminished to auscultation bilaterally with coarse breath sounds, no wheezing, rales, rhonchi or crackles. Normal respiratory effort and patient is not tachypenic. No accessory muscle use.  Unlabored breathing Cardiovascular: RRR, no murmurs / rubs / gallops. S1 and S2 auscultated. No extremity edema. Abdomen: Soft, non-tender, overweight. Bowel sounds positive.  GU: Deferred. Musculoskeletal: No clubbing / cyanosis of digits/nails. No joint deformity upper and lower extremities.  Skin: No rashes, lesions, ulcers on limited skin evaluation. No induration; Warm and dry.  Neurologic: CN 2-12 grossly intact with no focal deficits.  Romberg sign and cerebellar reflexes not assessed.  Psychiatric: Impaired judgment and insight  Data Reviewed: I have personally reviewed following labs and imaging studies  CBC: Recent Labs  Lab  09/17/22 0435 09/21/22 1441 09/22/22 0343  WBC 10.8* 12.6* 11.3*  NEUTROABS  --  10.1*  --   HGB 10.9* 10.6* 10.7*  HCT 34.3* 32.0* 32.7*  MCV 70.4* 68.5* 68.0*  PLT 517* 349 280   Basic Metabolic Panel: Recent Labs  Lab 09/16/22 0634 09/17/22 0435 09/18/22 0251 09/21/22 1500 09/22/22 0343  NA  --  130*  --  131* 130*  K  --  4.3  --  4.6 3.9  CL  --  89*  --  88* 91*  CO2  --  26  --  24 25  GLUCOSE  --  90  --  125* 108*  BUN  --  35*  --  160* 79*  CREATININE  --  5.12*  --  10.17* 6.60*  CALCIUM  --  9.2  --  9.8 9.3  MG 2.4 2.0 2.9*  --   --   PHOS  --   --   --  5.2*  --    GFR: Estimated Creatinine Clearance: 11.5 mL/min (A) (by C-G formula based on SCr  of 6.6 mg/dL (H)). Liver Function Tests: Recent Labs  Lab 09/21/22 1500  ALBUMIN 3.2*   No results for input(s): "LIPASE", "AMYLASE" in the last 168 hours. No results for input(s): "AMMONIA" in the last 168 hours. Coagulation Profile: No results for input(s): "INR", "PROTIME" in the last 168 hours. Cardiac Enzymes: No results for input(s): "CKTOTAL", "CKMB", "CKMBINDEX", "TROPONINI" in the last 168 hours. BNP (last 3 results) No results for input(s): "PROBNP" in the last 8760 hours. HbA1C: No results for input(s): "HGBA1C" in the last 72 hours. CBG: Recent Labs  Lab 09/21/22 1949 09/22/22 0005 09/22/22 0541 09/22/22 1323 09/22/22 1710  GLUCAP 182* 153* 117* 146* 95   Lipid Profile: Recent Labs    09/20/22 0639  TRIG 160*   Thyroid Function Tests: No results for input(s): "TSH", "T4TOTAL", "FREET4", "T3FREE", "THYROIDAB" in the last 72 hours. Anemia Panel: No results for input(s): "VITAMINB12", "FOLATE", "FERRITIN", "TIBC", "IRON", "RETICCTPCT" in the last 72 hours. Sepsis Labs: No results for input(s): "PROCALCITON", "LATICACIDVEN" in the last 168 hours.  No results found for this or any previous visit (from the past 240 hour(s)).   Radiology Studies: DG Swallowing Func-Speech  Pathology  Result Date: 09/22/2022 Table formatting from the original result was not included. Objective Swallowing Evaluation: Type of Study: MBS-Modified Barium Swallow Study  Patient Details Name: Randy Ross MRN: [id number] Date of Birth: [birth year] Today's Date: 09/22/2022 Time: SLP Start Time (ACUTE ONLY): 1210 -SLP Stop Time (ACUTE ONLY): 1240 SLP Time Calculation (min) (ACUTE ONLY): 30 min Past Medical History: Past Medical History: Diagnosis Date  Anemia   CKD (chronic kidney disease)   on wed 12/09/21  Diabetes mellitus without complication ([place])   type 2  Hypertension   Renal disorder  Past Surgical History: Past Surgical History: Procedure Laterality Date  AV FISTULA PLACEMENT Left 12/10/2021  Procedure: LEFT RADIAL CEPHALIC  ARTERIOVENOUS (AV) FISTULA;  Surgeon: Waynetta Sandy, MD;  Location: [place] [facility];  Service: Vascular;  Laterality: Left;  IR DIALY SHUNT INTRO [place] W/IMG LEFT Left 09/13/2022  IR FLUORO GUIDE CV LINE RIGHT  12/09/2021  IR FLUORO GUIDE CV LINE RIGHT  09/15/2022  IR US GUIDE VASC ACCESS RIGHT  12/09/2021  IR US GUIDE VASC ACCESS RIGHT  09/15/2022  LUMBAR [place] SURGERY  2021  L5-S1 HPI: Pt is a 51 y.o. male who presented with sudden onset slurred speech, chest pain, left sided weakness. Surrounding edema in the face of the fourth ventricle without hydrocephalus. 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 ETT 11/14-11/30 (self-extubated). Palliative meeting 12/1: full scope. PMH: end-stage renal disease on intermittent hemodialysis, diabetes, hypertension.  No data recorded  Recommendations for follow up therapy are one component of a multi-disciplinary discharge planning process, led by the attending physician.  Recommendations may be updated based on patient status, additional functional criteria and insurance authorization. Assessment / Plan / Recommendation   09/22/2022   2:38 PM Clinical Impressions Clinical  Impression The study was suboptimal since pt's frequent movement facilitated the some swallows being missed. Pt presents with oropharyngeal dysphagia characterized by weak lingual manipulation, reduced lingual retraction, reduced bolus cohesion, impaired posterior propulsion and a pharyngeal delay. He demonstrated lingual pumping, oral residue, vallecular residue, and significant oral holding which, considering his performance on 12/12, SLP suspects could be at least partly attributed to the barium being less palatable. Penetration (PAS 5) and aspiration (PAS 7) were noted with thin and nectar thick liquids secondary to the pharyngeal delay. Aspiration of nectar thick liquids  was eliminated with a single bolus of nectar thick liquids, but this could not be replicated due to pt's unwillingness to swallow subsequent nectar thick boluses. All instances of aspiration were sensed, but coughing was ineffective in expelling aspirated material. A dysphagia 1 diet with honey thick liquids will be initiated at this time; considering pt's consistently good laryngeal sensation, SLP anticipates that a repeat instrumental assessment will not be needed for diet advancement. Pt's performance and SLP recommendations were discussed with Dr. Alfredia Ferguson since a G-tube was being considered on 12/12. It was agreed that SLP will continue dysphagia treatment, and that adequacy of p.o. intake will be assessed with possible impelementation of a calorie count. SLP Visit Diagnosis Dysphagia, oropharyngeal phase (R13.12) Impact on safety and function Mild aspiration risk;Moderate aspiration risk     09/22/2022   2:38 PM Treatment Recommendations Treatment Recommendations Therapy as outlined in treatment plan below     09/22/2022   2:38 PM Prognosis Prognosis for Safe Diet Advancement Good Barriers to Reach Goals Cognitive deficits   09/22/2022   2:38 PM Diet Recommendations SLP Diet Recommendations Dysphagia 1 (Puree) solids;Honey thick liquids  Liquid Administration via Straw;Cup Medication Administration Crushed with puree Compensations Small sips/bites;Slow rate Postural Changes Seated upright at 90 degrees     09/22/2022   2:38 PM Other Recommendations Oral Care Recommendations Oral care BID Follow Up Recommendations Skilled nursing-short term rehab (<3 hours/day) Functional Status Assessment Patient has had a recent decline in their functional status and demonstrates the ability to make significant improvements in function in a reasonable and predictable amount of time.   09/22/2022   2:38 PM Frequency and Duration  Speech Therapy Frequency (ACUTE ONLY) min 2x/week Treatment Duration 2 weeks     09/22/2022   2:38 PM Oral Phase Oral Phase Impaired Oral - Honey Cup Lingual pumping;Weak lingual manipulation;Reduced posterior propulsion;Holding of bolus;Decreased bolus cohesion;Delayed oral transit Oral - Nectar Cup Lingual pumping;Weak lingual manipulation;Reduced posterior propulsion;Holding of bolus;Decreased bolus cohesion;Delayed oral transit Oral - Nectar Straw Lingual pumping;Weak lingual manipulation;Reduced posterior propulsion;Holding of bolus;Decreased bolus cohesion;Delayed oral transit Oral - Thin Cup Lingual pumping;Weak lingual manipulation;Reduced posterior propulsion;Holding of bolus;Decreased bolus cohesion;Delayed oral transit Oral - Thin Straw Lingual pumping;Weak lingual manipulation;Reduced posterior propulsion;Holding of bolus;Decreased bolus cohesion;Delayed oral transit Oral - Puree Lingual pumping;Weak lingual manipulation;Reduced posterior propulsion;Holding of bolus;Decreased bolus cohesion;Delayed oral transit    09/22/2022   2:38 PM Pharyngeal Phase Pharyngeal Phase Impaired Pharyngeal- Honey Cup Reduced tongue base retraction;Pharyngeal residue - valleculae;Delayed swallow initiation-vallecula Pharyngeal- Nectar Cup Reduced tongue base retraction;Pharyngeal residue - valleculae;Delayed swallow initiation-pyriform  sinuses;Delayed swallow initiation-vallecula;Penetration/Aspiration before swallow;Penetration/Aspiration during swallow Pharyngeal Material enters airway, CONTACTS cords and not ejected out;Material enters airway, passes BELOW cords and not ejected out despite cough attempt by patient Pharyngeal- Nectar Straw Reduced tongue base retraction;Pharyngeal residue - valleculae;Delayed swallow initiation-pyriform sinuses;Delayed swallow initiation-vallecula;Penetration/Aspiration before swallow;Penetration/Aspiration during swallow Pharyngeal Material enters airway, passes BELOW cords and not ejected out despite cough attempt by patient Pharyngeal- Thin Cup Reduced tongue base retraction;Pharyngeal residue - valleculae;Delayed swallow initiation-pyriform sinuses;Delayed swallow initiation-vallecula;Penetration/Aspiration before swallow;Penetration/Aspiration during swallow Pharyngeal Material enters airway, passes BELOW cords and not ejected out despite cough attempt by patient Pharyngeal- Thin Straw Reduced tongue base retraction;Pharyngeal residue - valleculae;Delayed swallow initiation-pyriform sinuses;Delayed swallow initiation-vallecula;Penetration/Aspiration before swallow;Penetration/Aspiration during swallow Pharyngeal Material enters airway, passes BELOW cords and not ejected out despite cough attempt by patient Pharyngeal- Puree Delayed swallow initiation-pyriform sinuses;Delayed swallow initiation-vallecula    09/22/2022   2:38 PM Cervical Esophageal Phase  Cervical Esophageal Phase  Impaired Thin Cup Esophageal backflow into the pharynx Thin Straw Esophageal backflow into the pharynx Shanika I. Hardin Negus, [place], [place] Office number [phone] [name] 09/22/2022, 3:00 PM                     CT ABDOMEN WO CONTRAST  Result Date: 09/21/2022 CLINICAL DATA:  Evaluate for percutaneous gastrostomy tube placement EXAM: CT ABDOMEN WITHOUT CONTRAST TECHNIQUE: Multidetector CT  imaging of the abdomen was performed following the standard protocol without IV contrast. RADIATION DOSE REDUCTION: This exam was performed according to the departmental dose-optimization program which includes automated exposure control, adjustment of the mA and/or kV according to patient size and/or use of iterative reconstruction technique. COMPARISON:  None Available. FINDINGS: Lower chest: Mild atelectasis in the RIGHT lower lobe RIGHT middle lobe. No pleural fluid small focus of airspace disease in the most inferior RIGHT lower lobe (image 20/5) Hepatobiliary: No focal hepatic lesion on noncontrast exam. Gallbladder normal. Pancreas: Normal pancreatic parenchymal intensity. No ductal dilatation or inflammation. Spleen: Normal spleen. Adrenals/urinary tract: Adrenal glands limited view of the kidney is unremarkable. Stomach/Bowel: Feeding tube extends into the stomach with tip in the duodenal bulb. Stomach in normal orientation with the gastric antrum approaching the ventral abdominal wall. Vascular/Lymphatic: Abdominal aortic normal caliber. No retroperitoneal periportal lymphadenopathy. Musculoskeletal: No aggressive osseous lesion IMPRESSION: 1. Stomach in typical anatomic position. 2. Feeding tube with tip in the first portion duodenum. 3. Very small focus of infection or pneumonitis at the RIGHT lung base. Electronically Signed   By: Suzy Bouchard M.D.   On: 09/21/2022 09:07    Scheduled Meds:  amLODipine  5 mg Per Tube BID   carvedilol  25 mg Per Tube BID WC   Chlorhexidine Gluconate Cloth  6 each Topical Q0600   Chlorhexidine Gluconate Cloth  6 each Topical Q0600   darbepoetin (ARANESP) injection - DIALYSIS  150 mcg Subcutaneous Q Mon-1800   feeding supplement (PROSource TF20)  60 mL Per Tube Daily   heparin injection (subcutaneous)  5,000 Units Subcutaneous Q8H   hydrALAZINE  100 mg Per Tube Q8H   insulin aspart  0-15 Units Subcutaneous Q6H   insulin glargine-yfgn  10 Units Subcutaneous  Daily   multivitamin  1 tablet Per Tube QHS   nutrition supplement (JUVEN)  1 packet Per Tube BID BM   mouth rinse  15 mL Mouth Rinse 4 times per day   pantoprazole (PROTONIX) IV  40 mg Intravenous Q12H   QUEtiapine  50 mg Per NG tube BID   sevelamer carbonate  2.4 g Per Tube TID WC   Continuous Infusions:  sodium chloride Stopped (08/30/22 0033)   feeding supplement (NEPRO CARB STEADY) 1,000 mL (09/22/22 1344)    LOS: 29 days   Raiford Noble, DO [facility] Available via Epic secure chat 7am-7pm After these hours, please refer to coverage provider listed on [email or web address] 09/22/2022, 5:14 PM

## 2022-09-23 DIAGNOSIS — I639 Cerebral infarction, unspecified: Secondary | ICD-10-CM

## 2022-09-23 DIAGNOSIS — Z794 Long term (current) use of insulin: Secondary | ICD-10-CM

## 2022-09-23 DIAGNOSIS — G936 Cerebral edema: Secondary | ICD-10-CM

## 2022-09-23 DIAGNOSIS — I161 Hypertensive emergency: Secondary | ICD-10-CM

## 2022-09-23 DIAGNOSIS — E1122 Type 2 diabetes mellitus with diabetic chronic kidney disease: Secondary | ICD-10-CM

## 2022-09-23 DIAGNOSIS — G9341 Metabolic encephalopathy: Secondary | ICD-10-CM

## 2022-09-23 LAB — CBC WITH DIFFERENTIAL/PLATELET
Abs Immature Granulocytes: 0.04 10*3/uL (ref 0.00–0.07)
Basophils Absolute: 0 10*3/uL (ref 0.0–0.1)
Basophils Relative: 0 %
Eosinophils Absolute: 0.3 10*3/uL (ref 0.0–0.5)
Eosinophils Relative: 3 %
HCT: 34.2 % — ABNORMAL LOW (ref 39.0–52.0)
Hemoglobin: 11 g/dL — ABNORMAL LOW (ref 13.0–17.0)
Immature Granulocytes: 0 %
Lymphocytes Relative: 9 %
Lymphs Abs: 1 10*3/uL (ref 0.7–4.0)
MCH: 22 pg — ABNORMAL LOW (ref 26.0–34.0)
MCHC: 32.2 g/dL (ref 30.0–36.0)
MCV: 68.5 fL — ABNORMAL LOW (ref 80.0–100.0)
Monocytes Absolute: 1 10*3/uL (ref 0.1–1.0)
Monocytes Relative: 9 %
Neutro Abs: 8.2 10*3/uL — ABNORMAL HIGH (ref 1.7–7.7)
Neutrophils Relative %: 79 %
Platelets: 319 10*3/uL (ref 150–400)
RBC: 4.99 MIL/uL (ref 4.22–5.81)
RDW: 20.2 % — ABNORMAL HIGH (ref 11.5–15.5)
WBC: 10.5 10*3/uL (ref 4.0–10.5)
nRBC: 0 % (ref 0.0–0.2)

## 2022-09-23 LAB — GLUCOSE, CAPILLARY
Glucose-Capillary: 119 mg/dL — ABNORMAL HIGH (ref 70–99)
Glucose-Capillary: 127 mg/dL — ABNORMAL HIGH (ref 70–99)
Glucose-Capillary: 142 mg/dL — ABNORMAL HIGH (ref 70–99)
Glucose-Capillary: 162 mg/dL — ABNORMAL HIGH (ref 70–99)
Glucose-Capillary: 169 mg/dL — ABNORMAL HIGH (ref 70–99)
Glucose-Capillary: 169 mg/dL — ABNORMAL HIGH (ref 70–99)

## 2022-09-23 LAB — COMPREHENSIVE METABOLIC PANEL
ALT: 14 U/L (ref 0–44)
AST: 27 U/L (ref 15–41)
Albumin: 3.4 g/dL — ABNORMAL LOW (ref 3.5–5.0)
Alkaline Phosphatase: 72 U/L (ref 38–126)
Anion gap: 17 — ABNORMAL HIGH (ref 5–15)
BUN: 135 mg/dL — ABNORMAL HIGH (ref 6–20)
CO2: 24 mmol/L (ref 22–32)
Calcium: 9.6 mg/dL (ref 8.9–10.3)
Chloride: 88 mmol/L — ABNORMAL LOW (ref 98–111)
Creatinine, Ser: 8.91 mg/dL — ABNORMAL HIGH (ref 0.61–1.24)
GFR, Estimated: 7 mL/min — ABNORMAL LOW (ref >60)
Glucose, Bld: 111 mg/dL — ABNORMAL HIGH (ref 70–99)
Potassium: 4.4 mmol/L (ref 3.5–5.1)
Sodium: 129 mmol/L — ABNORMAL LOW (ref 135–145)
Total Bilirubin: 0.3 mg/dL (ref 0.3–1.2)
Total Protein: 7.8 g/dL (ref 6.5–8.1)

## 2022-09-23 LAB — PHOSPHORUS: Phosphorus: 4.9 mg/dL — ABNORMAL HIGH (ref 2.5–4.6)

## 2022-09-23 LAB — TRIGLYCERIDES: Triglycerides: 76 mg/dL (ref <150)

## 2022-09-23 LAB — MAGNESIUM: Magnesium: 2.4 mg/dL (ref 1.7–2.4)

## 2022-09-23 MED ORDER — SEVELAMER CARBONATE 2.4 G PO PACK
2.4000 g | PACK | Freq: Three times a day (TID) | ORAL | Status: DC
  Administered 2022-09-24 – 2022-10-02 (×21): 2.4 g via ORAL
  Filled 2022-09-23 (×28): qty 1

## 2022-09-23 MED ORDER — CHLORHEXIDINE GLUCONATE CLOTH 2 % EX PADS
6.0000 | MEDICATED_PAD | Freq: Every day | CUTANEOUS | Status: DC
  Administered 2022-09-25 – 2022-09-28 (×4): 6 via TOPICAL

## 2022-09-23 MED ORDER — AMLODIPINE BESYLATE 5 MG PO TABS
5.0000 mg | ORAL_TABLET | Freq: Two times a day (BID) | ORAL | Status: DC
  Administered 2022-09-23 – 2022-10-14 (×35): 5 mg via ORAL
  Filled 2022-09-23 (×37): qty 1

## 2022-09-23 MED ORDER — NEPRO/CARBSTEADY PO LIQD
1000.0000 mL | ORAL | Status: DC
  Administered 2022-09-23: 1000 mL
  Filled 2022-09-23 (×2): qty 1000

## 2022-09-23 MED ORDER — HEPARIN SODIUM (PORCINE) 1000 UNIT/ML IJ SOLN
INTRAMUSCULAR | Status: AC
  Administered 2022-09-23: 3200 [IU]
  Filled 2022-09-23: qty 4

## 2022-09-23 MED ORDER — HEPARIN SODIUM (PORCINE) 1000 UNIT/ML IJ SOLN
INTRAMUSCULAR | Status: AC
  Administered 2022-09-23: 2000 [IU]
  Filled 2022-09-23: qty 2

## 2022-09-23 MED ORDER — DIPHENHYDRAMINE HCL 50 MG/ML IJ SOLN
12.5000 mg | Freq: Once | INTRAMUSCULAR | Status: AC
  Administered 2022-09-23: 12.5 mg via INTRAVENOUS
  Filled 2022-09-23: qty 1

## 2022-09-23 MED ORDER — SODIUM CHLORIDE 0.9 % IV SOLN: 12.5000 mg | Freq: Once | INTRAVENOUS | Status: DC

## 2022-09-23 MED ORDER — QUETIAPINE FUMARATE 50 MG PO TABS
50.0000 mg | ORAL_TABLET | Freq: Two times a day (BID) | ORAL | Status: DC
  Administered 2022-09-23 – 2022-09-25 (×4): 50 mg via ORAL
  Filled 2022-09-23 (×4): qty 1

## 2022-09-23 MED ORDER — RENA-VITE PO TABS
1.0000 | ORAL_TABLET | Freq: Every day | ORAL | Status: DC
  Administered 2022-09-23 – 2022-10-13 (×21): 1 via ORAL
  Filled 2022-09-23 (×21): qty 1

## 2022-09-23 MED ORDER — HYDRALAZINE HCL 50 MG PO TABS
100.0000 mg | ORAL_TABLET | Freq: Three times a day (TID) | ORAL | Status: DC
  Administered 2022-09-23 – 2022-10-14 (×52): 100 mg via ORAL
  Filled 2022-09-23 (×55): qty 2

## 2022-09-23 MED ORDER — CARVEDILOL 12.5 MG PO TABS
25.0000 mg | ORAL_TABLET | Freq: Two times a day (BID) | ORAL | Status: DC
  Administered 2022-09-23 – 2022-10-14 (×32): 25 mg via ORAL
  Filled 2022-09-23 (×32): qty 2

## 2022-09-23 MED ORDER — HYDROXYZINE HCL 25 MG PO TABS
25.0000 mg | ORAL_TABLET | Freq: Three times a day (TID) | ORAL | Status: DC | PRN
  Administered 2022-09-23 – 2022-10-03 (×11): 25 mg via ORAL
  Filled 2022-09-23 (×11): qty 1

## 2022-09-23 NOTE — Progress Notes (Signed)
Palliative Medicine Progress Note   Patient Name: Randy Ross       Date: 09/23/2022 DOB: [birth year]  Age: 51 y.o. MRN#: [id number] Attending Physician: Kerney Elbe, DO Primary Care Physician: Medicine, [place] [facility] Admit Date: 08/24/2022    HPI/Patient Profile: 51 y.o. male  with past medical history of Anemia, ESRD on HD, diabetes, and hypertension admitted on 08/24/2022 with acute IPH with severe mass effect/brain swelling and hypertensive emergency.  Patient also with inability to protect airway due to mental status.  PMT consulted to discuss goals of care.    Per family patient goes by "Randy Ross".     Subjective: Chart reviewed. MBS done 12/13 and patient cleared for dysphagia 1 diet with honey thick liquids. Per RN, he ate 75% of his breakfast this morning.   Bedside visit. Patient is sleeping. PT in the room; they tried sitting him edge of bed but he went back to sleep.    Objective:  Physical Exam Vitals reviewed.  Constitutional:      General: He is sleeping. He is not in acute distress. HENT:     Head:     Comments: Cortrak in place Pulmonary:     Effort: Pulmonary effort is normal.             Vital Signs: BP 131/65 (BP Location: Right Arm)   Pulse 69   Temp 98.1 F (36.7 C) (Oral)   Resp 17   Ht '5\' 5"'$  (1.651 m)   Wt 72.2 kg   SpO2 99%   BMI 26.49 kg/m  SpO2: SpO2: 99 % O2 Device: O2 Device: Room Air      Palliative Medicine Assessment & Plan   Assessment: Principal Problem:   Acute cerebellar hemorrhage (HCC) Active Problems:   Anemia   ESRD (end stage renal disease) (HCC)   Diabetes mellitus (HCC)   Acute respiratory failure with hypoxia (HCC)   Hypertensive emergency   Pressure injury of skin   Acute  metabolic encephalopathy and delirium   Cerebral edema (HCC)   Acute embolic stroke ([place])   Lower GI bleeding   Dysphagia   Palliative care by specialist    Recommendations/Plan: Full scope care Family agreeable to PEG if needed Patient now cleared for  PO diet and reportedly eating well so far - hopefully can avoid  PEG placement PMT will continue to follow intermittently - please call [phone] for urgent needs   Code Status: Full code   Prognosis:  Unable to determine  Discharge Planning: To Be Determined    Thank you for allowing the Palliative Medicine Team to assist in the care of this patient.   MDM - moderate   Lavena Bullion, NP   Please contact Palliative Medicine Team phone at [phone] for questions and concerns.  For individual providers, please see AMION.

## 2022-09-23 NOTE — Progress Notes (Signed)
PROGRESS NOTE    Randy Ross  HUO:[id number] DOB: [birth year] DOA: 08/24/2022 PCP: Medicine, [place] [facility]   Brief Narrative:  Randy Ross is a 51 y.o. male with past medical history significant for ESRD on HD, DM2, essential hypertension who presented to [facility] ED with sudden onset slurred speech, headache.  Workup in the ER with elevated BP of 260/140 and CT head showing cerebellar hemorrhage.  Patient was initially intubated in the ED for airway protection and admitted to the PCCM/neurology service.   Significant Hospital events: 11/14: Admitted for acute cerebellar hemorrhage; intubated in the ER, started on hypertonic saline 11/15: Neurosurgery were consulted, recommended against surgical evacuation 11/16: Cefepime started for suspected aspiration 11/17: Required 1u PRBC transfusion due to CRRT filter 11/19: EEG nonspecific 11/21: MRI brain shows new scattered bilateral acute infarcts 11/22: Completed 7 days antibiotics 11/24: IR consulted given recirculation syndrome, high BUN; plan for fistulogram pending 11/27: Temp cath HD cath placed  11/30: Self-extubated 12/1: Persistent delirium 12/2: Transferred out of ICU to [facility] service 12/3: Pulled out cortrak 12/4: Failed SLP, needs cortrak, fistulogram shows poor outflow 12/5: TDC placed, feeding tube replaced 12/6: HD catheter placed by IR 12/9: Family meeting with palliative care, family to decide on possible transitioning to comfort measures  12/11: Family decided to continue aggressive measures, IR consulted for PEG tube 12/12: SLP requesting MBS prior to PEG tube placement; MBS likely plan for 12/12 12/13: MBS done and recommending dysphagia 1 diet with honey thick liquids.  Will see how the patient tolerates p.o. intake and obtain a calorie count prior to initiation of a PEG.  Will continue the Nepro at 50 MLS per hour and Prosource tube feedings once a day as well as considering adjusting if he has  good p.o. intake  Assessment and Plan: Acute Cerebellar hemorrhage 2/2 poorly controlled hypertension Cerebral edema -Patient presenting to ED via EMS with mental status change, left-sided weakness, slurred speech and left-sided gaze preference.   -Patient was noted to be severely hypertensive on admission.   -Patient was subsequently intubated for airway protection and CT notable for IPH right cerebellar hemisphere with mass effect.   -Patient was started on Cleviprex, Versed and admitted under the critical care/neurology service.  Patient was seen by neurosurgery who did not recommend surgical intervention; and patient was started on hypertonic saline and placed on CRRT to minimize rapid fluid shifts. -- Continues with poor progression in terms of mental status, repeat CT head x 3 remained stable overall very poor prognosis and palliative care following.   -Neurology now signed off.   -WBC went from 12.6 -> 11.3 -> 10.5 -And CRRT converted back to HD per nephrology.  Family meeting planned for 12/9; family now to discuss amongst themselves whether transitioning to comfort measures versus need for PEG tube and SNF placement. -SLP evaluating for Congitive/Language  -Will hold off PEG currently given MBS results as below    Dysphagia, improving  -Etiology low please secondary to persistent encephalopathy, patient has pulled out core track [name], replaced today.   -Palliative care following, plan for family meeting on 12/9 and if no progression towards oral intake and family still wanting aggressive measures will need PEG tube placed if fails his Diet Trial. -Continue tube feeds via core track  -SLP to repeat MBS 12/13 and now on Dysphagia 1 Puree Solids with Honey Thick Liquids; Doing well and Eating well per nursing  -IR consulted for PEG tube placement but will hold of -Aspiration precautions -  Will trial Calorie Count prior with Diet as above and TF and if fails will need PEG; Calorie  Count ongoing    Lower GI bleeding: Resolved -Noted by nurse, transient, no change in hemoglobin.  No further bleeding reported. -Continue intermittent monitoring of hemoglobin -Hgb/Hct went from 10.6/32.0 -> 10.7/32.7 -> 11.0/34.2 -Continue to monitor for signs and symptoms of bleeding -Repeat CBC in a.m.   Acute embolic CVA -MR brain 09/38 with many new acute infarcts throughout the bilateral frontal and parietal white matter consistent with embolism.   -TTE with LVEF 50-55%.  LDL 91, hemoglobin A1c 5.9. -Holding antiplatelet for anemia, cerebellar hemorrhage. -Neurology now signed off; and do not recommend further evaluation for cardiac source of embolism as he is not a good long-term anticoagulation candidate.   Hypertensive emergency Hx essential hypertension -BP was severely elevated on admission, likely leading to acute cerebellar hemorrhage. -Continuing Amlodipine 5 mg per tube twice daily, Carvedilol 25 mg per tube twice daily, Hydralazine 100 mg per tube every 8 hours -Continue to Monitor BP per Protocol  -Last BP reading was 126/80   Acute respiratory failure with hypoxia: Resolved Aspiration pneumonia -Patient was initially intubated for airway protection upon ED arrival, patient self-extubated on 11/30.   -SpO2: 100 % O2 Flow Rate (L/min): 2 L/min FiO2 (%): 30 % -Completed course of antibiotics with cefepime followed by ceftriaxone.   Acute Metabolic Encephalopathy -Patient continues to not be able to follow commands, likely secondary to brain insult but could be a language barrier -SLP consulted for Congnitive and Language Eval  -C/w Supportive care, Seroquel   Type 2 diabetes mellitus -Hemoglobin A1c 5.9 08/25/2022, well-controlled. -C/w Semglee 10 units [place] daily -C/w Moderate SSI for coverage -CBGs qAC/HS; CBGs ranging from 95-169   ESRD on HD Elevated AG -Continue HD per nephrology and patient is going for Dialysis today  -Patient's BUN/Cr went from  160/10.17 -> 79/6.60 -> 135/8.91 -Patient has an elevated AG and is 17 with a CO2 of 88, and CO2 level of 24 -Avoid further nephrotoxic medications, contrast dyes, hypotension and dehydration to ensure adequate renal perfusion and will and renally dose medications -Repeat CMP in the a.m.   Anemia of Chronic Medical/renal disease -Hemoglobin stable, continue intermittent monitoring.  -Transfuse hemoglobin < 7.0 -Patient's hemoglobin/hematocrit went from 10.6/32.0 -> 10.7/32.7 -> 11.0/34.2 -Repeat CBC in a.m.   Hyponatremia -Patient's sodium went from 131 -> 130 -> 129 -Continue to Monitor and Trend and repeat CMP in the AM   Hypoalbuminemia -Patient's Albumin Level is now 3.4 -Continue to Monitor and Trend -Repeat CMP in the AM    Clotted fistula -Fistulogram showed poor outflow from fistula, nephrology consulted IR for HD catheter placement which was performed on 12/6.  DVT prophylaxis: heparin injection 5,000 Units Start: 09/12/22 1445 Place and maintain sequential compression device Start: 08/25/22 1025    Code Status: Full Code Family Communication: No family currently at bedside  Disposition Plan:  Level of care: Telemetry Medical Status is: Inpatient Remains inpatient appropriate because: Will need further clinical improvement and evaluation of calorie count    Consultants:  Nephrology [facility] Neurosurgery Nephrology Interventional radiology Palliative care  Procedures:  HD catheter placement, IR 12/6  Antimicrobials:  Anti-infectives (From admission, onward)    Start     Dose/Rate Route Frequency Ordered Stop   09/14/22 1145  ceFAZolin (ANCEF) IVPB 2g/100 mL premix        2 g 200 mL/hr over 30 Minutes Intravenous To Radiology 09/14/22 1123 09/15/22 0905  08/29/22 2000  cefTRIAXone (ROCEPHIN) 2 g in sodium chloride 0.9 % 100 mL IVPB        2 g 200 mL/hr over 30 Minutes Intravenous Every 24 hours 08/29/22 1009 09/01/22 2146   08/28/22 2000  ceFEPIme (MAXIPIME)  1 g in sodium chloride 0.9 % 100 mL IVPB  Status:  Discontinued        1 g 200 mL/hr over 30 Minutes Intravenous Every 24 hours 08/27/22 1009 08/27/22 1016   08/27/22 2000  ceFEPIme (MAXIPIME) 1 g in sodium chloride 0.9 % 100 mL IVPB  Status:  Discontinued        1 g 200 mL/hr over 30 Minutes Intravenous Every 24 hours 08/27/22 1016 08/29/22 1009   08/26/22 1000  ceFEPIme (MAXIPIME) 2 g in sodium chloride 0.9 % 100 mL IVPB  Status:  Discontinued        2 g 200 mL/hr over 30 Minutes Intravenous Every 12 hours 08/26/22 0850 08/27/22 1009       Subjective: Seen and examined at bedside he is resting comfortably.  Nursing stated that he had vomited earlier but ate the majority and almost all of his food.  Patient is to go for dialysis later today.  Calorie count is still ongoing.  When asked if the patient has pain he shakes his head no.  Does not really respond otherwise and could be a language barrier.  No other concerns or complaints at this time.  Objective: Vitals:   09/23/22 1440 09/23/22 1446 09/23/22 1530 09/23/22 1600  BP: 132/79 139/77 132/80 126/80  Pulse: 70 72 79 81  Resp: '17 18 14 16  '$ Temp: 97.7 F (36.5 C)     TempSrc: Oral     SpO2: 100% 100% 100% 100%  Weight:      Height:       No intake or output data in the 24 hours ending 09/23/22 1618 Filed Weights   09/18/22 1834 09/21/22 1424 09/21/22 1751  Weight: 71.6 kg 74.3 kg 72.2 kg   Examination: Physical Exam:  Constitutional: WN/WD overweight male in no acute distress Respiratory: Diminished to auscultation bilaterally, no wheezing, rales, rhonchi or crackles. Normal respiratory effort and patient is not tachypenic. No accessory muscle use.  Unlabored breathing Cardiovascular: RRR, no murmurs / rubs / gallops. S1 and S2 auscultated. No extremity edema.  Abdomen: Soft, non-tender, slightly distended secondary body habitus. Bowel sounds positive.  GU: Deferred. Musculoskeletal: No clubbing / cyanosis of digits/nails.  No joint deformity upper and lower extremities.  Skin: No rashes, lesions, ulcers on limited skin evaluation. No induration; Warm and dry.  Neurologic: Does not really follow commands and does not really verbalize but does respond to questioning somewhat Psychiatric: Impaired judgment and insight  Data Reviewed: I have personally reviewed following labs and imaging studies  CBC: Recent Labs  Lab 09/17/22 0435 09/21/22 1441 09/22/22 0343 09/23/22 0546  WBC 10.8* 12.6* 11.3* 10.5  NEUTROABS  --  10.1*  --  8.2*  HGB 10.9* 10.6* 10.7* 11.0*  HCT 34.3* 32.0* 32.7* 34.2*  MCV 70.4* 68.5* 68.0* 68.5*  PLT 517* 349 324 127   Basic Metabolic Panel: Recent Labs  Lab 09/17/22 0435 09/18/22 0251 09/21/22 1500 09/22/22 0343 09/23/22 0546  NA 130*  --  131* 130* 129*  K 4.3  --  4.6 3.9 4.4  CL 89*  --  88* 91* 88*  CO2 26  --  '24 25 24  '$ GLUCOSE 90  --  125* 108* 111*  BUN 35*  --  160* 79* 135*  CREATININE 5.12*  --  10.17* 6.60* 8.91*  CALCIUM 9.2  --  9.8 9.3 9.6  MG 2.0 2.9*  --   --  2.4  PHOS  --   --  5.2*  --  4.9*   GFR: Estimated Creatinine Clearance: 8.5 mL/min (A) (by C-G formula based on SCr of 8.91 mg/dL (H)). Liver Function Tests: Recent Labs  Lab 09/21/22 1500 09/23/22 0546  AST  --  27  ALT  --  14  ALKPHOS  --  72  BILITOT  --  0.3  PROT  --  7.8  ALBUMIN 3.2* 3.4*   No results for input(s): "LIPASE", "AMYLASE" in the last 168 hours. No results for input(s): "AMMONIA" in the last 168 hours. Coagulation Profile: No results for input(s): "INR", "PROTIME" in the last 168 hours. Cardiac Enzymes: No results for input(s): "CKTOTAL", "CKMB", "CKMBINDEX", "TROPONINI" in the last 168 hours. BNP (last 3 results) No results for input(s): "PROBNP" in the last 8760 hours. HbA1C: No results for input(s): "HGBA1C" in the last 72 hours. CBG: Recent Labs  Lab 09/22/22 1323 09/22/22 1710 09/23/22 0037 09/23/22 0605 09/23/22 1140  GLUCAP 146* 95 142* 119*  169*  169*   Lipid Profile: Recent Labs    09/23/22 0546  TRIG 76   Thyroid Function Tests: No results for input(s): "TSH", "T4TOTAL", "FREET4", "T3FREE", "THYROIDAB" in the last 72 hours. Anemia Panel: No results for input(s): "VITAMINB12", "FOLATE", "FERRITIN", "TIBC", "IRON", "RETICCTPCT" in the last 72 hours. Sepsis Labs: No results for input(s): "PROCALCITON", "LATICACIDVEN" in the last 168 hours.  No results found for this or any previous visit (from the past 240 hour(s)).   Radiology Studies: DG Swallowing Func-Speech Pathology  Result Date: 09/22/2022 Table formatting from the original result was not included. Objective Swallowing Evaluation: Type of Study: MBS-Modified Barium Swallow Study  Patient Details Name: Randy Ross MRN: [id number] Date of Birth: 09-09-1971 Today's Date: 09/22/2022 Time: SLP Start Time (ACUTE ONLY): 1210 -SLP Stop Time (ACUTE ONLY): 1240 SLP Time Calculation (min) (ACUTE ONLY): 30 min Past Medical History: Past Medical History: Diagnosis Date  Anemia   CKD (chronic kidney disease)   on wed 12/09/21  Diabetes mellitus without complication ([place])   type 2  Hypertension   Renal disorder  Past Surgical History: Past Surgical History: Procedure Laterality Date  AV FISTULA PLACEMENT Left 12/10/2021  Procedure: LEFT RADIAL CEPHALIC  ARTERIOVENOUS (AV) FISTULA;  Surgeon: Waynetta Sandy, MD;  Location: [facility];  Service: Vascular;  Laterality: Left;  IR DIALY SHUNT INTRO [place] W/IMG LEFT Left 09/13/2022  IR FLUORO GUIDE CV LINE RIGHT  12/09/2021  IR FLUORO GUIDE CV LINE RIGHT  09/15/2022  IR US GUIDE VASC ACCESS RIGHT  12/09/2021  IR US GUIDE VASC ACCESS RIGHT  09/15/2022  LUMBAR [place] SURGERY  2021  L5-S1 HPI: Pt is a 51 y.o. male who presented with sudden onset slurred speech, chest pain, left sided weakness. Surrounding edema in the face of the fourth ventricle without hydrocephalus. MRI brain 11/21: acute/recent intraparenchymal hemorrhage within  the right cerebellum. ETT 11/14-11/30 (self-extubated). Palliative meeting 12/1: full scope. PMH: end-stage renal disease on intermittent hemodialysis, diabetes, hypertension.  No data recorded  Recommendations for follow up therapy are one component of a multi-disciplinary discharge planning process, led by the attending physician.  Recommendations may be updated based on patient status, additional functional criteria and insurance authorization. Assessment / Plan / Recommendation   09/22/2022  2:38 PM Clinical Impressions Clinical Impression The study was suboptimal since pt's frequent movement facilitated the some swallows being missed. Pt presents with oropharyngeal dysphagia characterized by weak lingual manipulation, reduced lingual retraction, reduced bolus cohesion, impaired posterior propulsion and a pharyngeal delay. He demonstrated lingual pumping, oral residue, vallecular residue, and significant oral holding which, considering his performance on 12/12, SLP suspects could be at least partly attributed to the barium being less palatable. Penetration (PAS 5) and aspiration (PAS 7) were noted with thin and nectar thick liquids secondary to the pharyngeal delay. Aspiration of nectar thick liquids was eliminated with a single bolus of nectar thick liquids, but this could not be replicated due to pt's unwillingness to swallow subsequent nectar thick boluses. All instances of aspiration were sensed, but coughing was ineffective in expelling aspirated material. A dysphagia 1 diet with honey thick liquids will be initiated at this time; considering pt's consistently good laryngeal sensation, SLP anticipates that a repeat instrumental assessment will not be needed for diet advancement. Pt's performance and SLP recommendations were discussed with Dr. Alfredia Ferguson since a G-tube was being considered on 12/12. It was agreed that SLP will continue dysphagia treatment, and that adequacy of p.o. intake will be assessed with  possible impelementation of a calorie count. SLP Visit Diagnosis Dysphagia, oropharyngeal phase (R13.12) Impact on safety and function Mild aspiration risk;Moderate aspiration risk     09/22/2022   2:38 PM Treatment Recommendations Treatment Recommendations Therapy as outlined in treatment plan below     09/22/2022   2:38 PM Prognosis Prognosis for Safe Diet Advancement Good Barriers to Reach Goals Cognitive deficits   09/22/2022   2:38 PM Diet Recommendations SLP Diet Recommendations Dysphagia 1 (Puree) solids;Honey thick liquids Liquid Administration via Straw;Cup Medication Administration Crushed with puree Compensations Small sips/bites;Slow rate Postural Changes Seated upright at 90 degrees     09/22/2022   2:38 PM Other Recommendations Oral Care Recommendations Oral care BID Follow Up Recommendations Skilled nursing-short term rehab (<3 hours/day) Functional Status Assessment Patient has had a recent decline in their functional status and demonstrates the ability to make significant improvements in function in a reasonable and predictable amount of time.   09/22/2022   2:38 PM Frequency and Duration  Speech Therapy Frequency (ACUTE ONLY) min 2x/week Treatment Duration 2 weeks     09/22/2022   2:38 PM Oral Phase Oral Phase Impaired Oral - Honey Cup Lingual pumping;Weak lingual manipulation;Reduced posterior propulsion;Holding of bolus;Decreased bolus cohesion;Delayed oral transit Oral - Nectar Cup Lingual pumping;Weak lingual manipulation;Reduced posterior propulsion;Holding of bolus;Decreased bolus cohesion;Delayed oral transit Oral - Nectar Straw Lingual pumping;Weak lingual manipulation;Reduced posterior propulsion;Holding of bolus;Decreased bolus cohesion;Delayed oral transit Oral - Thin Cup Lingual pumping;Weak lingual manipulation;Reduced posterior propulsion;Holding of bolus;Decreased bolus cohesion;Delayed oral transit Oral - Thin Straw Lingual pumping;Weak lingual manipulation;Reduced posterior  propulsion;Holding of bolus;Decreased bolus cohesion;Delayed oral transit Oral - Puree Lingual pumping;Weak lingual manipulation;Reduced posterior propulsion;Holding of bolus;Decreased bolus cohesion;Delayed oral transit    09/22/2022   2:38 PM Pharyngeal Phase Pharyngeal Phase Impaired Pharyngeal- Honey Cup Reduced tongue base retraction;Pharyngeal residue - valleculae;Delayed swallow initiation-vallecula Pharyngeal- Nectar Cup Reduced tongue base retraction;Pharyngeal residue - valleculae;Delayed swallow initiation-pyriform sinuses;Delayed swallow initiation-vallecula;Penetration/Aspiration before swallow;Penetration/Aspiration during swallow Pharyngeal Material enters airway, CONTACTS cords and not ejected out;Material enters airway, passes BELOW cords and not ejected out despite cough attempt by patient Pharyngeal- Nectar Straw Reduced tongue base retraction;Pharyngeal residue - valleculae;Delayed swallow initiation-pyriform sinuses;Delayed swallow initiation-vallecula;Penetration/Aspiration before swallow;Penetration/Aspiration during swallow Pharyngeal Material enters airway, passes BELOW cords and not  ejected out despite cough attempt by patient Pharyngeal- Thin Cup Reduced tongue base retraction;Pharyngeal residue - valleculae;Delayed swallow initiation-pyriform sinuses;Delayed swallow initiation-vallecula;Penetration/Aspiration before swallow;Penetration/Aspiration during swallow Pharyngeal Material enters airway, passes BELOW cords and not ejected out despite cough attempt by patient Pharyngeal- Thin Straw Reduced tongue base retraction;Pharyngeal residue - valleculae;Delayed swallow initiation-pyriform sinuses;Delayed swallow initiation-vallecula;Penetration/Aspiration before swallow;Penetration/Aspiration during swallow Pharyngeal Material enters airway, passes BELOW cords and not ejected out despite cough attempt by patient Pharyngeal- Puree Delayed swallow initiation-pyriform sinuses;Delayed swallow  initiation-vallecula    09/22/2022   2:38 PM Cervical Esophageal Phase  Cervical Esophageal Phase Impaired Thin Cup Esophageal backflow into the pharynx Thin Straw Esophageal backflow into the pharynx Shanika I. Hardin Negus, [place], [place] Office number [phone] [name] 09/22/2022, 3:00 PM                      Scheduled Meds:  amLODipine  5 mg Oral BID   carvedilol  25 mg Oral BID WC   Chlorhexidine Gluconate Cloth  6 each Topical Q0600   [START ON 09/24/2022] Chlorhexidine Gluconate Cloth  6 each Topical Q0600   darbepoetin (ARANESP) injection - DIALYSIS  150 mcg Subcutaneous Q Mon-1800   feeding supplement (NEPRO CARB STEADY)  1,000 mL Per Tube Q24H   heparin injection (subcutaneous)  5,000 Units Subcutaneous Q8H   hydrALAZINE  100 mg Oral Q8H   insulin aspart  0-15 Units Subcutaneous Q6H   insulin glargine-yfgn  10 Units Subcutaneous Daily   multivitamin  1 tablet Oral QHS   mouth rinse  15 mL Mouth Rinse 4 times per day   pantoprazole (PROTONIX) IV  40 mg Intravenous Q12H   QUEtiapine  50 mg Oral BID   sevelamer carbonate  2.4 g Oral TID WC   Continuous Infusions:  sodium chloride Stopped (08/30/22 0033)    LOS: 30 days   Raiford Noble, DO [facility] Available via Epic secure chat 7am-7pm After these hours, please refer to coverage provider listed on [email or web address] 09/23/2022, 4:18 PM

## 2022-09-23 NOTE — Progress Notes (Signed)
Speech Language Pathology Treatment: Dysphagia  Patient Details Name: Randy Ross MRN: [id number] DOB: November 21, 1970 Today's Date: 09/23/2022 Time: 5374-8270 SLP Time Calculation (min) (ACUTE ONLY): 14 min  Assessment / Plan / Recommendation Clinical Impression  Pt was seen for dysphagia treatment. He was alert and cooperative during the session. Pt tolerated nectar thick liquids without overt s/s of aspiration. Mild oral holding was noted with this consistency, but it was notably more functional than that demonstrated during yesterday's MBS. Pt demonstrated moderate to severely prolonged mastication with dysphagia 2 and dysphagia 3 solids. Moderate residue was noted thereafter and intermittent coughing observed; aspiration secondary to premature spillage and pharyngeal delay suspected. Pt's diet will be advanced to dysphagia 1 with nectar thick liquids and SLP will continue to follow pt.     HPI HPI: Pt is a 51 y.o. male who presented with sudden onset slurred speech, chest pain, left sided weakness. Surrounding edema in the face of the fourth ventricle without hydrocephalus. 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 ETT 11/14-11/30 (self-extubated). Palliative meeting 12/1: full scope. PMH: end-stage renal disease on intermittent hemodialysis, diabetes, hypertension.      SLP Plan  Continue with current plan of care      Recommendations for follow up therapy are one component of a multi-disciplinary discharge planning process, led by the attending physician.  Recommendations may be updated based on patient status, additional functional criteria and insurance authorization.    Recommendations  Diet recommendations: Dysphagia 1 (puree);Nectar-thick liquid Liquids provided via: Cup;Straw Medication Administration: Crushed with puree (or via cortrak) Supervision: Staff to assist with self feeding Compensations: Slow rate;Small  sips/bites;Minimize environmental distractions Postural Changes and/or Swallow Maneuvers: Seated upright 90 degrees                Oral Care Recommendations: Oral care BID Follow Up Recommendations: Skilled nursing-short term rehab (<3 hours/day) Assistance recommended at discharge: Frequent or constant Supervision/Assistance SLP Visit Diagnosis: Dysphagia, unspecified (R13.10) Plan: Continue with current plan of care          Davinia Riccardi I. Hardin Negus, [place], [place] Office number [phone]  [name]  09/23/2022, 10:36 AM

## 2022-09-23 NOTE — Progress Notes (Signed)
Gave patient CHG bath at 526. Around 610 pt trying to scratch legs and abdomen. States "itchy all over" and grunting. No visible skin changes noted. Provider notified. See new orders.

## 2022-09-23 NOTE — Progress Notes (Signed)
Patient's skin is intact.  Sacrum intact.  No apparent skin wounds.

## 2022-09-23 NOTE — Progress Notes (Addendum)
Calorie Count Note  48-hour calorie count ordered.  Reviewed pt's first two meals since diet advancement and pt eating very well. Will adjust TF to nocturnal to maximize day time intake. If pt able to meet needs, will likely be able to remove cortrak tube in the coming days. SLP able to advance liquid thickness to nectar today  Addendum 12/15: added lunch nutrition meeting ~2/3 of needs  Diet: DYS 1, nectar thick Supplements: Magic Cup TID, Nepro BID  Estimated Nutritional Needs:  Kcal:  2000-2200 kcal/d Protein:  100-125g/d Fluid:  1000 ml + UOP  12/13 Dinner: 552 kcal, 28g of protein 12/14 Breakfast: 402 kcal, 22g of protein 12/14 Lunch: 348kcal, 12g of protein  Total intake of two meals since diet advancement: 1302 kcal (65% of minimum estimated needs)  62 protein (62% of minimum estimated needs)  NUTRITION DIAGNOSIS:  Inadequate oral intake related to inability to eat as evidenced by NPO status. - Remains applicable   GOAL:  Patient will meet greater than or equal to 90% of their needs - progressing, TF and diet in place  INTERVENTION:  Continue the following: Nepro @ 50 ml/h x 12 hours nocturnally (600 ml per day) Provides 1062 kcal, 49 gm protein, 436 ml free water daily Consider adjusting to nocturnal if pt has good PO intake DYS 1 diet with nectar thick liquids per SLP Automatic trays and feeding assistance Contine 48-h kcal count to monitor intake   Ranell Patrick, RD, LDN Clinical Dietitian RD pager # available in [facility]  After hours/weekend pager # available in [facility]

## 2022-09-23 NOTE — Progress Notes (Signed)
Physical Therapy Treatment Patient Details Name: Randy Ross MRN: [id number] DOB: [birth year] Today's Date: 09/23/2022   History of Present Illness 51 y.o. male presented 08/24/22 with slurred speech and headache. Head CT Rt cerebellar hemorrhage; intubated 11/14, self-extubated 11/30; CRRT; MRI brain 11/21 many small acute infarcts; PMH significant for end-stage renal disease on intermittent hemodialysis, diabetes, hypertension    PT Comments    Patient seen for PT session and again very lethargic with RN reporting pt gets Seroquel twice per day (1000 and 2200). Patient demonstrating focused attention when name called, but quickly drifts off to sleep. Assisted pt to sit at EOB with +2 total assist with goal of activating his reticular system and increasing his alertness. He remained lethargic and began to lie down. Assisted to return to bed with max assist.  Will notify MD of pt's inability to be awake for therapies.    Recommendations for follow up therapy are one component of a multi-disciplinary discharge planning process, led by the attending physician.  Recommendations may be updated based on patient status, additional functional criteria and insurance authorization.  Follow Up Recommendations  Skilled nursing-short term rehab (<3 hours/day) Can patient physically be transported by private vehicle: No   Assistance Recommended at Discharge Frequent or constant Supervision/Assistance  Patient can return home with the following Two people to help with walking and/or transfers;Two people to help with bathing/dressing/bathroom;Assistance with cooking/housework;Assistance with feeding;Direct supervision/assist for medications management;Direct supervision/assist for financial management;Assist for transportation;Help with stairs or ramp for entrance   Equipment Recommendations  None recommended by PT    Recommendations for Other Services       Precautions / Restrictions  Precautions Precautions: Fall;Other (comment) Precaution Comments: pulled out multiple cortraks Restrictions Weight Bearing Restrictions: No     Mobility  Bed Mobility Overal bed mobility: Needs Assistance Bed Mobility: Rolling, Sidelying to Sit, Sit to Sidelying Rolling: Total assist Sidelying to sit: Total assist, +2 for physical assistance, HOB elevated     Sit to sidelying: Max assist General bed mobility comments: pt briefly opening eyes when name called; moved pt to sit EOB to see if alertness would improve; pt then began trying to lie back down in the bed and assisted lifting his legs onto bed    Transfers                   General transfer comment: unsafe to attempt    Ambulation/Gait                   Stairs             Wheelchair Mobility    Modified Rankin (Stroke Patients Only)       Balance Overall balance assessment: Needs assistance Sitting-balance support: No upper extremity supported, Feet unsupported Sitting balance-Leahy Scale: Zero Sitting balance - Comments: pt lethargic                                    Cognition Arousal/Alertness: Lethargic, Suspect due to medications (pt given Seroquel ~ 3hrs prior) Behavior During Therapy: Flat affect Overall Cognitive Status: Impaired/Different from baseline                                 General Comments: lethargic; not verbal; not following commands; spontaneously moving all 4 extremities (bil UEs in restraints due to h/o pulling out  cortrak)        Exercises      General Comments General comments (skin integrity, edema, etc.): Spoke with RN and she reports he gets Seroquel twice per day (1000 and 2200)      Pertinent Vitals/Pain Pain Assessment Pain Assessment: Faces Faces Pain Scale: No hurt    Home Living                          Prior Function            PT Goals (current goals can now be found in the care plan  section) Acute Rehab PT Goals Patient Stated Goal: unable Time For Goal Achievement: 09/29/22 Potential to Achieve Goals: Poor Progress towards PT goals: Not progressing toward goals - comment    Frequency    Min 2X/week (trial)      PT Plan      Co-evaluation              AM-PAC PT "6 Clicks" Mobility   Outcome Measure  Help needed turning from your back to your side while in a flat bed without using bedrails?: Total Help needed moving from lying on your back to sitting on the side of a flat bed without using bedrails?: Total Help needed moving to and from a bed to a chair (including a wheelchair)?: Total Help needed standing up from a chair using your arms (e.g., wheelchair or bedside chair)?: Total Help needed to walk in hospital room?: Total Help needed climbing 3-5 steps with a railing? : Total 6 Click Score: 6    End of Session   Activity Tolerance: Patient limited by lethargy Patient left: in bed;with bed alarm set;with restraints reapplied (bil wrist restraints and mitts) Nurse Communication: Other (comment) (not following commands) PT Visit Diagnosis: Other abnormalities of gait and mobility (R26.89);Other symptoms and signs involving the nervous system (R29.898)     Time: 9528-4132 PT Time Calculation (min) (ACUTE ONLY): 11 min  Charges:  $Therapeutic Activity: 8-22 mins                      Arby Barrette, PT Acute Rehabilitation Services  Office [phone]    [name] 09/23/2022, 1:41 PM

## 2022-09-23 NOTE — Evaluation (Signed)
Speech Language Pathology Evaluation Patient Details Name: Randy Ross MRN: [id number] DOB: 1971-09-13 Today's Date: 09/23/2022 Time: 4098-1191 SLP Time Calculation (min) (ACUTE ONLY): 15 min  Problem List:  Patient Active Problem List   Diagnosis Date Noted   Palliative care by specialist 09/16/2022   Cerebral edema ([place]) 47/82/9562   Acute embolic stroke ([place]) 13/05/6577   Lower GI bleeding 09/12/2022   Dysphagia 09/12/2022   Gastrointestinal hemorrhage 46/96/2952   Acute metabolic encephalopathy and delirium 09/10/2022   Pressure injury of skin 09/01/2022   Acute respiratory failure with hypoxia ([place]) 08/25/2022   Hypertensive emergency 08/25/2022   Encounter for continuous renal replacement therapy (CRRT) for acute renal failure ([place]) 08/25/2022   Acute cerebellar hemorrhage ([place]) 08/25/2022   ESRD (end stage renal disease) ([place]) 12/15/2021   Essential hypertension 12/15/2021   Diabetes mellitus ([place]) 12/15/2021   CKD (chronic kidney disease), stage IV ([place]) 12/08/2021   AKI (acute kidney injury) ([place]) 12/08/2021   MGUS (monoclonal gammopathy of unknown significance) 08/06/2019   Anemia 08/06/2019   Past Medical History:  Past Medical History:  Diagnosis Date   Anemia    CKD (chronic kidney disease)    on wed 12/09/21   Diabetes mellitus without complication ([place])    type 2   Hypertension    Renal disorder    Past Surgical History:  Past Surgical History:  Procedure Laterality Date   AV FISTULA PLACEMENT Left 12/10/2021   Procedure: LEFT RADIAL CEPHALIC  ARTERIOVENOUS (AV) FISTULA;  Surgeon: Waynetta Sandy, MD;  Location: [facility];  Service: Vascular;  Laterality: Left;   IR DIALY SHUNT INTRO NEEDLE/INTRACATH INITIAL W/IMG LEFT Left 09/13/2022   IR FLUORO GUIDE CV LINE RIGHT  12/09/2021   IR FLUORO GUIDE CV LINE RIGHT  09/15/2022   IR US GUIDE VASC ACCESS RIGHT  12/09/2021   IR US GUIDE VASC ACCESS RIGHT  09/15/2022   LUMBAR [place] SURGERY  2021   L5-S1    HPI:  Pt is a 51 y.o. male who presented with sudden onset slurred speech, chest pain, left sided weakness. Surrounding edema in the face of the fourth ventricle without hydrocephalus. 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 ETT 11/14-11/30 (self-extubated). Palliative meeting 12/1: full scope. PMH: end-stage renal disease on intermittent hemodialysis, diabetes, hypertension.   Assessment / Plan / Recommendation Clinical Impression  Pt participated in speech-language-cognition evaluation. 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 He stated that he lives with his mother and had no deficits in speech, language, or cognition prior to admission. Per the pt, his speech is now "different" from baseline and he endorse difficulty with attention. Informal assessment and use of portions of formal cognition screeners revealed impairments in the areas of awareness, orientation, attention, memory, and problem solving. Pt's processing speed was slow and repetition was often necessary to increase likelihood of a response or to improve accuracy. Mild-moderate dysarthria was noted characterized by reduced vocal intensity, reduced articulatory precision, and a hoarse vocal quality (likely at least partly due to prolonged intubation). Verbal output was limited, but pt's responses were intermittently unrelated to the conversational topic and additional time was necessary for sentence formulation; SLP suspects that both are likely due to impairments in sustained attention. Skilled SLP services are clinically indicated at this time to improve motor speech and cognitive-linguistic function.    SLP Assessment  SLP Recommendation/Assessment: Patient needs continued Speech Lanaguage Pathology Services SLP Visit Diagnosis: Cognitive communication deficit (R41.841);Dysarthria and anarthria (R47.1)  Recommendations for follow up therapy are one component of  a multi-disciplinary discharge planning process, led by the attending physician.  Recommendations may be updated based on patient status, additional functional criteria and insurance authorization.    Follow Up Recommendations  Skilled nursing-short term rehab (<3 hours/day)    Assistance Recommended at Discharge  Frequent or constant Supervision/Assistance  Functional Status Assessment Patient has had a recent decline in their functional status and demonstrates the ability to make significant improvements in function in a reasonable and predictable amount of time.  Frequency and Duration min 2x/week  2 weeks      SLP Evaluation Cognition  Overall Cognitive Status: Impaired/Different from baseline Arousal/Alertness: Awake/alert Orientation Level: Oriented to person;Disoriented to place;Disoriented to time;Disoriented to situation Day of Week: Incorrect Attention: Focused;Sustained Focused Attention: Appears intact Sustained Attention: Impaired Sustained Attention Impairment: Verbal basic Memory: Impaired Memory Impairment: Storage deficit;Retrieval deficit;Decreased short term memory;Decreased recall of new information (Immediate: 2/5 despite repetition) Awareness: Impaired Awareness Impairment: Emergent impairment       Comprehension  Auditory Comprehension Overall Auditory Comprehension: Impaired Yes/No Questions: Impaired Basic Biographical Questions:  (5/5) Complex Questions:  (2/5) Commands: Impaired One Step Basic Commands:  (4/4) Two Step Basic Commands:  (1/4) Conversation: Simple Interfering Components: Attention;Processing speed;Working Field seismologist: Extra processing time;Repetition    Expression Expression Primary Mode of Expression: Verbal Verbal Expression Initiation: Impaired Level of Generative/Spontaneous Verbalization: Conversation Pragmatics: Impairment Impairments: Abnormal affect;Eye contact;Topic maintenance Interfering Components:  Attention   Oral / Motor  Motor Speech Overall Motor Speech: Impaired Phonation: Low vocal intensity;Hoarse Articulation: Impaired Level of Impairment: Sentence Intelligibility: Intelligibility reduced Word: 50-74% accurate Phrase: 25-49% accurate Sentence: 25-49% accurate Motor Planning: Witnin functional limits Motor Speech Errors: Aware;Consistent           Tobie Poet I. Hardin Negus, [place], [place] Office number [phone]  [name] 09/23/2022, 10:49 AM

## 2022-09-23 NOTE — Progress Notes (Signed)
Prior to administering meds via tube this morning, pt threw up some of his breakfast.  Notified MD.  TF were going per orders.

## 2022-09-23 NOTE — Progress Notes (Signed)
Received patient in bed to unit.  Alert and oriented.  Informed consent signed and in chart.   Treatment initiated: [name] Treatment completed: 6606  Patient tolerated well.  Transported back to the room  Alert, without acute distress.  Hand-off given to patient's nurse.   Access used:R HD catheter Access issues: none  Total UF removed: 1000 mL Medication(s) given: none         09/23/22 1758  Vitals  Temp 97.6 F (36.4 C)  Pulse Rate (!) 25  Resp 15  BP 134/79  SpO2 99 %  O2 Device Room Air  Weight 73.5 kg  Type of Weight Post-Dialysis  Oxygen Therapy  Patient Activity (if Appropriate) In bed  Post Treatment  Dialyzer Clearance Lightly streaked  Duration of HD Treatment -hour(s) 3 hour(s)  Liters Processed 72  Fluid Removed (mL) 1000 mL  Tolerated HD Treatment Yes   Randy Ross S [facility]

## 2022-09-23 NOTE — Progress Notes (Signed)
  [facility] Progress Note   Subjective:   seen in room, actually speaking short sentences, "I'm itching all over"  Objective Vitals:   09/22/22 2355 09/23/22 0409 09/23/22 0740 09/23/22 1139  BP: 117/68 128/65 (!) 142/80 131/65  Pulse: 69 74 74 69  Resp: '16 16 17 17  '$ Temp: 98.3 F (36.8 C) 98.1 F (36.7 C) 98 F (36.7 C) 98.1 F (36.7 C)  TempSrc: Oral Oral Oral Oral  SpO2: 98% 98% 100% 99%  Weight:      Height:       Physical Exam General: awake, no distress Heart: RRR, no murmurs, rubs or gallops Lungs: CTA anteriorly Abdomen: Soft, non-distended, +BS Extremities: no edema b/l lower extremties Neuro: as above Dialysis Access:  LUE AVF + bruit, R IJ Tunneled  HD catheter c/d/i    Dialysis Orders: SW TTS (while here, OP is MWF) 3h 28mn  80kg   400/800   2/2 bath  Hep 2000  LFA AVF - last HD 11/13, post 80.1kg - mircera 150 q2, last 11/13 - hectorol 46m IV q HD  Assessment/Plan: 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 S/P course of hypertonic fluids.  Mental status remains poor.  HTN/ vol  - getting amlodipine/ coreg/amlodipine per tube, also has PRNs ordered. No edema on exam, BP's good. New lower dry wt ~72kg. Min UF w/ HD.  AMS - due to CVA/ bleed. As above.  ESRD - pt required CRRT 11/15-11/17. HD today and then will resume MWF w/ HD tomorrow. .  HD access - BUN was very high so access recirc was suspected. IR did fistulogram 12/4 and AVF was found be small with competing veins. Using RIJ TD[place]et[place]ow placed by IR on 12/6.  Anemia of ESRD: Hgb 9s, continue Aranesp 15068mweekly while here. Secondary HPTH: CCa ok. renvela per NG tube now at goal. Continue VDRA.  DM2 - per pmd  GOC - poor cognitive function improving.    RobKelly SplinterD 09/23/2022, 1:15 PM  Recent Labs  Lab 09/21/22 1500 09/22/22 0343 09/23/22 0546  HGB  --  10.7* 11.0*  ALBUMIN 3.2*  --  3.4*  CALCIUM 9.8 9.3 9.6  PHOS 5.2*  --  4.9*   CREATININE 10.17* 6.60* 8.91*  K 4.6 3.9 4.4     Inpatient medications:  amLODipine  5 mg Oral BID   carvedilol  25 mg Oral BID WC   Chlorhexidine Gluconate Cloth  6 each Topical Q0600   darbepoetin (ARANESP) injection - DIALYSIS  150 mcg Subcutaneous Q Mon-1800   feeding supplement (NEPRO CARB STEADY)  1,000 mL Per Tube Q24H   heparin injection (subcutaneous)  5,000 Units Subcutaneous Q8H   hydrALAZINE  100 mg Oral Q8H   insulin aspart  0-15 Units Subcutaneous Q6H   insulin glargine-yfgn  10 Units Subcutaneous Daily   multivitamin  1 tablet Oral QHS   mouth rinse  15 mL Mouth Rinse 4 times per day   pantoprazole (PROTONIX) IV  40 mg Intravenous Q12H   QUEtiapine  50 mg Oral BID   sevelamer carbonate  2.4 g Oral TID WC    sodium chloride Stopped (08/30/22 0033)   sodium chloride, acetaminophen (TYLENOL) oral liquid 160 mg/5 mL, bisacodyl, fentaNYL (SUBLIMAZE) injection, hydrALAZINE, hydrOXYzine, labetalol, LORazepam, ondansetron (ZOFRAN) IV, mouth rinse

## 2022-09-24 LAB — COMPREHENSIVE METABOLIC PANEL
ALT: 33 U/L (ref 0–44)
AST: 46 U/L — ABNORMAL HIGH (ref 15–41)
Albumin: 3.4 g/dL — ABNORMAL LOW (ref 3.5–5.0)
Alkaline Phosphatase: 70 U/L (ref 38–126)
Anion gap: 13 (ref 5–15)
BUN: 73 mg/dL — ABNORMAL HIGH (ref 6–20)
CO2: 26 mmol/L (ref 22–32)
Calcium: 9 mg/dL (ref 8.9–10.3)
Chloride: 90 mmol/L — ABNORMAL LOW (ref 98–111)
Creatinine, Ser: 6.73 mg/dL — ABNORMAL HIGH (ref 0.61–1.24)
GFR, Estimated: 9 mL/min — ABNORMAL LOW (ref >60)
Glucose, Bld: 188 mg/dL — ABNORMAL HIGH (ref 70–99)
Potassium: 3.7 mmol/L (ref 3.5–5.1)
Sodium: 129 mmol/L — ABNORMAL LOW (ref 135–145)
Total Bilirubin: 0.4 mg/dL (ref 0.3–1.2)
Total Protein: 7.6 g/dL (ref 6.5–8.1)

## 2022-09-24 LAB — CBC WITH DIFFERENTIAL/PLATELET
Abs Immature Granulocytes: 0.03 10*3/uL (ref 0.00–0.07)
Basophils Absolute: 0 10*3/uL (ref 0.0–0.1)
Basophils Relative: 0 %
Eosinophils Absolute: 0.2 10*3/uL (ref 0.0–0.5)
Eosinophils Relative: 2 %
HCT: 33.6 % — ABNORMAL LOW (ref 39.0–52.0)
Hemoglobin: 10.9 g/dL — ABNORMAL LOW (ref 13.0–17.0)
Immature Granulocytes: 0 %
Lymphocytes Relative: 8 %
Lymphs Abs: 0.8 10*3/uL (ref 0.7–4.0)
MCH: 22.2 pg — ABNORMAL LOW (ref 26.0–34.0)
MCHC: 32.4 g/dL (ref 30.0–36.0)
MCV: 68.6 fL — ABNORMAL LOW (ref 80.0–100.0)
Monocytes Absolute: 1 10*3/uL (ref 0.1–1.0)
Monocytes Relative: 10 %
Neutro Abs: 7.7 10*3/uL (ref 1.7–7.7)
Neutrophils Relative %: 80 %
Platelets: 290 10*3/uL (ref 150–400)
RBC: 4.9 MIL/uL (ref 4.22–5.81)
RDW: 19.9 % — ABNORMAL HIGH (ref 11.5–15.5)
WBC: 9.7 10*3/uL (ref 4.0–10.5)
nRBC: 0 % (ref 0.0–0.2)

## 2022-09-24 LAB — HEPATITIS B SURFACE ANTIGEN: Hepatitis B Surface Ag: NONREACTIVE

## 2022-09-24 LAB — GLUCOSE, CAPILLARY
Glucose-Capillary: 117 mg/dL — ABNORMAL HIGH (ref 70–99)
Glucose-Capillary: 210 mg/dL — ABNORMAL HIGH (ref 70–99)

## 2022-09-24 LAB — PHOSPHORUS: Phosphorus: 3.9 mg/dL (ref 2.5–4.6)

## 2022-09-24 LAB — MAGNESIUM: Magnesium: 2 mg/dL (ref 1.7–2.4)

## 2022-09-24 MED ORDER — ALTEPLASE 2 MG IJ SOLR: 2.0000 mg | Freq: Once | INTRAMUSCULAR | Status: DC | PRN

## 2022-09-24 MED ORDER — NEPRO/CARBSTEADY PO LIQD
237.0000 mL | Freq: Three times a day (TID) | ORAL | Status: DC
  Administered 2022-09-25 – 2022-10-10 (×18): 237 mL via ORAL
  Filled 2022-09-24 (×2): qty 237

## 2022-09-24 MED ORDER — HEPARIN SODIUM (PORCINE) 1000 UNIT/ML IJ SOLN
INTRAMUSCULAR | Status: AC
  Administered 2022-09-24: 1000 [IU]
  Filled 2022-09-24: qty 1

## 2022-09-24 MED ORDER — ANTICOAGULANT SODIUM CITRATE 4% (200MG/5ML) IV SOLN: 5.0000 mL | Status: DC | PRN

## 2022-09-24 MED ORDER — HEPARIN SODIUM (PORCINE) 1000 UNIT/ML DIALYSIS
2000.0000 [IU] | INTRAMUSCULAR | Status: AC | PRN
  Administered 2022-09-24: 2000 [IU] via INTRAVENOUS_CENTRAL
  Filled 2022-09-24 (×2): qty 2

## 2022-09-24 MED ORDER — HEPARIN SODIUM (PORCINE) 1000 UNIT/ML DIALYSIS: 1000.0000 [IU] | INTRAMUSCULAR | Status: DC | PRN

## 2022-09-24 NOTE — Progress Notes (Addendum)
Calorie Count Note  48-hour calorie count ordered.  Only one ticket to review over the last 24 hours, but with good intake of breakfast. Pt likely able to meet > 75% of needs orally and with PO supplements can meet needs. Discussed with MD, will pull tube and work on dc to SNF as pt has bed offers.  Diet: DYS 1, nectar thick Supplements: Magic Cup TID, Nepro BID  Estimated Nutritional Needs:  Kcal:  2000-2200 kcal/d Protein:  100-125g/d Fluid:  1000 ml + UOP  12/14 Dinner: no ticket available 12/15 Breakfast: 599kcal, 18g of protein 12/15 Lunch: No ticket available, pt in HD  NUTRITION DIAGNOSIS:  Inadequate oral intake related to inability to eat as evidenced by NPO status. - Remains applicable   GOAL:  Patient will meet greater than or equal to 90% of their needs - progressing, TF and diet in place  INTERVENTION:  Dc TF orders DYS 1 diet with nectar thick liquids per SLP Automatic trays and feeding assistance Nepro Shake po TID, each supplement provides 425 kcal and 19 grams protein   Ranell Patrick, RD, LDN Clinical Dietitian RD pager # available in [facility]  After hours/weekend pager # available in [facility]

## 2022-09-24 NOTE — Progress Notes (Signed)
After releasing restraints for pt to feed himself for late breakfast, attempted in keeping him out of the restraints since he did so well this morning.  Bed alarm went off soon after.  Nurse sitting behind the pts room, immediately went in room and found patient in child's pose on the mat with covers underneath legs as if he thought he could get up, but rolled out of bed onto his knees. No signs of any injury, VS stable, MD notified.  Patient now in bed and Reapplied restraints.  Will continue to monitor.

## 2022-09-24 NOTE — Progress Notes (Signed)
  [facility] Progress Note   Subjective:   seen in room, per RN speaking more and more  Objective Vitals:   09/24/22 1130 09/24/22 1159 09/24/22 1440 09/24/22 1454  BP: 125/78 127/68 139/78 (!) 141/86  Pulse: 90 87 83   Resp: 18  16   Temp: 97.8 F (36.6 C)  98.3 F (36.8 C)   TempSrc: Oral  Oral   SpO2: 99% 99% 99%   Weight:   74.7 kg   Height:       Physical Exam General: awake, no distress, responsive Heart: RRR, no murmurs, rubs or gallops Lungs: CTA anteriorly Abdomen: Soft, non-distended, +BS Extremities: no edema b/l lower extremties Neuro: as above Dialysis Access:  LUE AVF + bruit, R IJ Tunneled  HD catheter c/d/i    Dialysis Orders: SW TTS (while here, OP is MWF) 3h 18mn  80kg   400/800   2/2 bath  Hep 2000  LFA AVF - last HD 11/13, post 80.1kg - mircera 150 q2, last 11/13 - hectorol 467m IV q HD  Assessment/Plan: 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 S/P course of hypertonic fluids.  Mental status remains poor.  HTN/ vol  - getting oral BP meds per tube/ po, no edema on exam, BP's good. New lower dry wt ~72kg. ^UF goal to 3kg next HD.  AMS - due to CVA/ bleed. As above.  ESRD - usual OP HD was TTS. Pt required CRRT 11/15-11/17. HD today to get back on MWF schedule.  HD access - BUN was very high so access recirc was suspected. IR did fistulogram 12/4 and AVF was found be small with competing veins. Using RIJ [place] placed by IR on 12/6.  Anemia of ESRD: Hgb 9s, continue Aranesp 15050mweekly while here. Secondary HPTH: CCa ok. Renvela 3 ac tid as binder. Continue VDRA.  DM2 - per pmd  GOC - poor cognitive function sig improving.    RobKelly SplinterD 09/24/2022, 4:26 PM  Recent Labs  Lab 09/23/22 0546 09/24/22 0958  HGB 11.0* 10.9*  ALBUMIN 3.4* 3.4*  CALCIUM 9.6 9.0  PHOS 4.9* 3.9  CREATININE 8.91* 6.73*  K 4.4 3.7     Inpatient medications:  amLODipine  5 mg Oral BID   carvedilol  25 mg  Oral BID WC   Chlorhexidine Gluconate Cloth  6 each Topical Q0600   Chlorhexidine Gluconate Cloth  6 each Topical Q0600   darbepoetin (ARANESP) injection - DIALYSIS  150 mcg Subcutaneous Q Mon-1800   feeding supplement (NEPRO CARB STEADY)  237 mL Oral TID WC   heparin injection (subcutaneous)  5,000 Units Subcutaneous Q8H   hydrALAZINE  100 mg Oral Q8H   insulin aspart  0-15 Units Subcutaneous Q6H   insulin glargine-yfgn  10 Units Subcutaneous Daily   multivitamin  1 tablet Oral QHS   mouth rinse  15 mL Mouth Rinse 4 times per day   pantoprazole (PROTONIX) IV  40 mg Intravenous Q12H   QUEtiapine  50 mg Oral BID   sevelamer carbonate  2.4 g Oral TID WC    sodium chloride Stopped (08/30/22 0033)   sodium chloride, acetaminophen (TYLENOL) oral liquid 160 mg/5 mL, bisacodyl, fentaNYL (SUBLIMAZE) injection, heparin, hydrALAZINE, hydrOXYzine, labetalol, LORazepam, ondansetron (ZOFRAN) IV, mouth rinse

## 2022-09-24 NOTE — Progress Notes (Signed)
Pt stated " I need to take a poop" this morning.  With nurse and tech together, pt slowly and unsteadily walked asssisted to the bathroom.  Pt sat on toilet for about 10 min and had no success.  Walked pt back to bed.  Reapplied restraints.  Mittens off.  Pt did very well.

## 2022-09-24 NOTE — Plan of Care (Signed)
  Problem: Activity: Goal: Ability to tolerate increased activity will improve Outcome: Progressing   Problem: Respiratory: Goal: Ability to maintain a clear airway and adequate ventilation will improve Outcome: Progressing   Problem: Role Relationship: Goal: Method of communication will improve Outcome: Progressing   Problem: Education: Goal: Ability to describe self-care measures that may prevent or decrease complications (Diabetes Survival Skills Education) will improve Outcome: Progressing   Problem: Fluid Volume: Goal: Ability to maintain a balanced intake and output will improve Outcome: Progressing   Problem: Health Behavior/Discharge Planning: Goal: Ability to identify and utilize available resources and services will improve Outcome: Progressing

## 2022-09-24 NOTE — Progress Notes (Signed)
   09/24/22 1839  Vitals  Temp 98.4 F (36.9 C)  Temp Source Oral  BP 131/67  MAP (mmHg) 87  BP Location Right Arm  BP Method Automatic  Patient Position (if appropriate) Lying  Pulse Rate 95  Pulse Rate Source Monitor  ECG Heart Rate 91  Resp 15  Oxygen Therapy  SpO2 100 %  O2 Device Room Air  Pulse Oximetry Type Continuous   Received patient in bed to unit.  Alert and oriented.  Informed consent signed and in chart.   Treatment initiated: [place] Treatment completed: 1832  Patient tolerated well.  Transported back to the room  Alert, without acute distress.  Hand-off given to patient's nurse.   Access used: HD cath Access issues: lines reversed  Total UF removed: 2500 ml Medication(s) given: Heparin 2000 units bolus, Heparin Dwells 3200 units Post HD VS: see above Post HD weight: 72.1kg   [facility]

## 2022-09-24 NOTE — Progress Notes (Signed)
PROGRESS NOTE    Randy Ross  RFF:[id number] DOB: 1971-07-12 DOA: 08/24/2022 PCP: Medicine, [facility]   Brief Narrative:  Randy Ross is a 51 y.o. male with past medical history significant for ESRD on HD, DM2, essential hypertension who presented to [facility] ED with sudden onset slurred speech, headache.  Workup in the ER with elevated BP of 260/140 and CT head showing cerebellar hemorrhage.  Patient was initially intubated in the ED for airway protection and admitted to the PCCM/neurology service.   Significant Hospital events: 11/14: Admitted for acute cerebellar hemorrhage; intubated in the ER, started on hypertonic saline 11/15: Neurosurgery were consulted, recommended against surgical evacuation 11/16: Cefepime started for suspected aspiration 11/17: Required 1u PRBC transfusion due to CRRT filter 11/19: EEG nonspecific 11/21: MRI brain shows new scattered bilateral acute infarcts 11/22: Completed 7 days antibiotics 11/24: IR consulted given recirculation syndrome, high BUN; plan for fistulogram pending 11/27: Temp cath HD cath placed  11/30: Self-extubated 12/1: Persistent delirium 12/2: Transferred out of ICU to [facility] service 12/3: Pulled out cortrak 12/4: Failed SLP, needs cortrak, fistulogram shows poor outflow 12/5: TDC placed, feeding tube replaced 12/6: HD catheter placed by IR 12/9: Family meeting with palliative care, family to decide on possible transitioning to comfort measures  12/11: Family decided to continue aggressive measures, IR consulted for PEG tube 12/12: SLP requesting MBS prior to PEG tube placement; MBS likely plan for 12/12 12/13: MBS done and recommending dysphagia 1 diet with honey thick liquids.  Will see how the patient tolerates p.o. intake and obtain a calorie count prior to initiation of a PEG.  Will continue the Nepro at 50 MLS per hour and Prosource tube feedings once a day as well as considering adjusting if he has  good p.o. intake 12/15: Patient doing fairly well with his calorie count so we will remove his pain to feeding tube and the dietitian adjusted his oral supplements.  He is a little agitated still so required soft mitten restraints.  Allegedly rolled out of the bed but had no acute injuries as a nurse over there.   Assessment and Plan: Acute Cerebellar hemorrhage 2/2 poorly controlled hypertension Cerebral edema -Patient presenting to ED via EMS with mental status change, left-sided weakness, slurred speech and left-sided gaze preference.   -Patient was noted to be severely hypertensive on admission.   -Patient was subsequently intubated for airway protection and CT notable for IPH right cerebellar hemisphere with mass effect.   -Patient was started on Cleviprex, Versed and admitted under the critical care/neurology service.  Patient was seen by neurosurgery who did not recommend surgical intervention; and patient was started on hypertonic saline and placed on CRRT to minimize rapid fluid shifts. -- Continues with poor progression in terms of mental status, repeat CT head x 3 remained stable overall very poor prognosis and palliative care following.   -Neurology now signed off.   -WBC went from 12.6 -> 11.3 -> 10.5 -> 9.7 -And CRRT converted back to HD per nephrology.  Family meeting planned for 12/9; family now to discuss amongst themselves whether transitioning to comfort measures versus need for PEG tube and SNF placement. -SLP evaluating for Congitive/Language  -Will hold off PEG currently given MBS results as below and now he is tolerating at diet    Dysphagia, improving  -Etiology low please secondary to persistent encephalopathy, patient has pulled out core track [name], replaced today.   -Palliative care following, plan for family meeting on 12/9 and if  no progression towards oral intake and family still wanting aggressive measures will need PEG tube placed if fails his Diet  Trial. -Continue tube feeds via core track  -SLP to repeat MBS 12/13 and now on Dysphagia 1 Puree Solids with Honey Thick Liquids; Doing well and Eating well per nursing  -IR consulted for PEG tube placement but will hold of -Aspiration precautions -Will trial Calorie Count prior with Diet as above and TF and if fails will need PEG; Calorie Count ongoing and dietitian feels patient will do well so will stop Calorie Count and D/C Cortrak    Lower GI bleeding: Resolved -Noted by nurse, transient, no change in hemoglobin.  No further bleeding reported. -Continue intermittent monitoring of hemoglobin -Hgb/Hct went from 10.6/32.0 -> 10.7/32.7 -> 11.0/34.2 -Continue to monitor for signs and symptoms of bleeding -Repeat CBC in a.m.   Acute Embolic CVA -MR brain 48/54 with many new acute infarcts throughout the bilateral frontal and parietal white matter consistent with embolism.   -TTE with LVEF 50-55%.  LDL 91, hemoglobin A1c 5.9.  -Holding antiplatelet for anemia, cerebellar hemorrhage.  -Neurology now signed off; and do not recommend further evaluation for cardiac source of embolism as he is not a good long-term anticoagulation candidate.   Hypertensive emergency Hx essential hypertension -BP was severely elevated on admission, likely leading to acute cerebellar hemorrhage. -Continuing Amlodipine 5 mg per tube twice daily, Carvedilol 25 mg per tube twice daily, Hydralazine 100 mg per tube every 8 hours -Continue to Monitor BP per Protocol  -Last BP reading was 131/67   Acute respiratory failure with hypoxia: Resolved Aspiration pneumonia -Patient was initially intubated for airway protection upon ED arrival, patient self-extubated on 11/30.   -SpO2: 100 % O2 Flow Rate (L/min): 2 L/min FiO2 (%): 30 % -Completed course of antibiotics with cefepime followed by ceftriaxone.   Acute Metabolic Encephalopathy -Patient continues to not be able to follow commands, likely secondary to brain  insult but could be a language barrier -SLP consulted for Congnitive and Language Eval  -C/w Supportive care, Seroquel but may need to adjust    Type 2 diabetes mellitus -Hemoglobin A1c 5.9 08/25/2022, well-controlled. -C/w Semglee 10 units [place] daily -C/w Moderate SSI for coverage -CBGs qAC/HS; CBGs ranging from 117-210   ESRD on HD Elevated AG -Continue HD per nephrology and patient is going for Dialysis today  -Patient's BUN/Cr went from 160/10.17 -> 79/6.60 -> 135/8.91 -> 73/6.73 -Patient has an elevated AG and was 17 with a CO2 of 88, and CO2 level of 24; Now AG is 13, CO2 is 26, and Chloride -Avoid further nephrotoxic medications, contrast dyes, hypotension and dehydration to ensure adequate renal perfusion and will and renally dose medications -Repeat CMP in the a.m.   Anemia of Chronic Medical/renal disease -Hemoglobin stable, continue intermittent monitoring.  -Transfuse hemoglobin < 7.0 -Patient's hemoglobin/hematocrit went from 10.6/32.0 -> 10.7/32.7 -> 11.0/34.2 -> 10.9/33.6 with an MCV of 68.6 -Repeat CBC in a.m.   Hyponatremia -Patient's sodium went from 131 -> 130 -> 129 x2 -Continue to Monitor and Trend and repeat CMP in the AM    Hypoalbuminemia -Patient's Albumin Level is now 3.4 x2 -Continue to Monitor and Trend -Repeat CMP in the AM    Clotted Fistula -Fistulogram showed poor outflow from fistula, nephrology consulted IR for HD catheter placement which was performed on 12/6.    DVT prophylaxis: heparin injection 5,000 Units Start: 09/12/22 1445 Place and maintain sequential compression device Start: 08/25/22 1025    Code  Status: Full Code Family Communication: No family currently at bedside  Disposition Plan:  Level of care: Telemetry Medical Status is: Inpatient Remains inpatient appropriate because: Needs SNF admit to ensure that he can adequately take an oral intake prior to safe discharge disposition.  Had to be placed restraints will need to be out  of restraints 24 hours prior to discharging to SNF   Consultants:  Nephrology [facility] Neurosurgery Nephrology Interventional radiology Palliative care  Procedures:  HD catheter placement  Antimicrobials:  Anti-infectives (From admission, onward)    Start     Dose/Rate Route Frequency Ordered Stop   09/14/22 1145  ceFAZolin (ANCEF) IVPB 2g/100 mL premix        2 g 200 mL/hr over 30 Minutes Intravenous To Radiology 09/14/22 1123 09/15/22 0905   08/29/22 2000  cefTRIAXone (ROCEPHIN) 2 g in sodium chloride 0.9 % 100 mL IVPB        2 g 200 mL/hr over 30 Minutes Intravenous Every 24 hours 08/29/22 1009 09/01/22 2146   08/28/22 2000  ceFEPIme (MAXIPIME) 1 g in sodium chloride 0.9 % 100 mL IVPB  Status:  Discontinued        1 g 200 mL/hr over 30 Minutes Intravenous Every 24 hours 08/27/22 1009 08/27/22 1016   08/27/22 2000  ceFEPIme (MAXIPIME) 1 g in sodium chloride 0.9 % 100 mL IVPB  Status:  Discontinued        1 g 200 mL/hr over 30 Minutes Intravenous Every 24 hours 08/27/22 1016 08/29/22 1009   08/26/22 1000  ceFEPIme (MAXIPIME) 2 g in sodium chloride 0.9 % 100 mL IVPB  Status:  Discontinued        2 g 200 mL/hr over 30 Minutes Intravenous Every 12 hours 08/26/22 0850 08/27/22 1009       Subjective: Seen and examined at bedside he is resting comfortably.  Apparently he was talking earlier to the nursing staff but did not verbalize anything to me.  He tracked me with his eyes.  Tolerating his diet quite well.  Supposed go for dialysis again going back today she will get on the Monday Wednesday Friday schedule.  No acute issues overnight and we will remove his cortrak tube feeding.  Objective: Vitals:   09/24/22 1800 09/24/22 1830 09/24/22 1832 09/24/22 1839  BP: 99/78 117/71 114/72 131/67  Pulse: 87 88 86 95  Resp: '13 15 13 15  '$ Temp:    98.4 F (36.9 C)  TempSrc:    Oral  SpO2: 99% 99% 99% 100%  Weight:    72.1 kg  Height:        Intake/Output Summary (Last 24 hours) at  09/24/2022 1906 Last data filed at 09/24/2022 1832 Gross per 24 hour  Intake --  Output 2500 ml  Net -2500 ml   Filed Weights   09/23/22 1758 09/24/22 1440 09/24/22 1839  Weight: 73.5 kg 74.7 kg 72.1 kg   Examination: Physical Exam:  Constitutional: WN/WD overweight male in no acute distress appears calm and tracks with his eyes but did not verbalize anything made today Respiratory: Diminished to auscultation bilaterally, no wheezing, rales, rhonchi or crackles. Normal respiratory effort and patient is not tachypenic. No accessory muscle use.  Labored breathing Cardiovascular: RRR, no murmurs / rubs / gallops. S1 and S2 auscultated. No extremity edema.  Abdomen: Soft, non-tender, non-distended. Bowel sounds positive.  GU: Deferred. Musculoskeletal: No clubbing / cyanosis of digits/nails. No joint deformity upper and lower extremities.  Skin: No rashes, lesions, ulcers limited skin  evaluation. No induration; Warm and dry.  Neurologic: Tracks with his eyes but does not verbalize or respond to my questioning Psychiatric: Impaired judgment and insight  Data Reviewed: I have personally reviewed following labs and imaging studies  CBC: Recent Labs  Lab 09/21/22 1441 09/22/22 0343 09/23/22 0546 09/24/22 0958  WBC 12.6* 11.3* 10.5 9.7  NEUTROABS 10.1*  --  8.2* 7.7  HGB 10.6* 10.7* 11.0* 10.9*  HCT 32.0* 32.7* 34.2* 33.6*  MCV 68.5* 68.0* 68.5* 68.6*  PLT 349 324 319 825   Basic Metabolic Panel: Recent Labs  Lab 09/18/22 0251 09/21/22 1500 09/22/22 0343 09/23/22 0546 09/24/22 0958  NA  --  131* 130* 129* 129*  K  --  4.6 3.9 4.4 3.7  CL  --  88* 91* 88* 90*  CO2  --  '24 25 24 26  '$ GLUCOSE  --  125* 108* 111* 188*  BUN  --  160* 79* 135* 73*  CREATININE  --  10.17* 6.60* 8.91* 6.73*  CALCIUM  --  9.8 9.3 9.6 9.0  MG 2.9*  --   --  2.4 2.0  PHOS  --  5.2*  --  4.9* 3.9   GFR: Estimated Creatinine Clearance: 11.3 mL/min (A) (by C-G formula based on SCr of 6.73 mg/dL  (H)). Liver Function Tests: Recent Labs  Lab 09/21/22 1500 09/23/22 0546 09/24/22 0958  AST  --  27 46*  ALT  --  14 33  ALKPHOS  --  72 70  BILITOT  --  0.3 0.4  PROT  --  7.8 7.6  ALBUMIN 3.2* 3.4* 3.4*   No results for input(s): "LIPASE", "AMYLASE" in the last 168 hours. No results for input(s): "AMMONIA" in the last 168 hours. Coagulation Profile: No results for input(s): "INR", "PROTIME" in the last 168 hours. Cardiac Enzymes: No results for input(s): "CKTOTAL", "CKMB", "CKMBINDEX", "TROPONINI" in the last 168 hours. BNP (last 3 results) No results for input(s): "PROBNP" in the last 8760 hours. HbA1C: No results for input(s): "HGBA1C" in the last 72 hours. CBG: Recent Labs  Lab 09/23/22 1140 09/23/22 2013 09/23/22 2347 09/24/22 0607 09/24/22 1207  GLUCAP 169*  169* 127* 162* 117* 210*   Lipid Profile: Recent Labs    09/23/22 0546  TRIG 76   Thyroid Function Tests: No results for input(s): "TSH", "T4TOTAL", "FREET4", "T3FREE", "THYROIDAB" in the last 72 hours. Anemia Panel: No results for input(s): "VITAMINB12", "FOLATE", "FERRITIN", "TIBC", "IRON", "RETICCTPCT" in the last 72 hours. Sepsis Labs: No results for input(s): "PROCALCITON", "LATICACIDVEN" in the last 168 hours.  No results found for this or any previous visit (from the past 240 hour(s)).   Radiology Studies: No results found.  Scheduled Meds:  amLODipine  5 mg Oral BID   carvedilol  25 mg Oral BID WC   Chlorhexidine Gluconate Cloth  6 each Topical Q0600   Chlorhexidine Gluconate Cloth  6 each Topical Q0600   darbepoetin (ARANESP) injection - DIALYSIS  150 mcg Subcutaneous Q Mon-1800   feeding supplement (NEPRO CARB STEADY)  237 mL Oral TID WC   heparin injection (subcutaneous)  5,000 Units Subcutaneous Q8H   hydrALAZINE  100 mg Oral Q8H   insulin aspart  0-15 Units Subcutaneous Q6H   insulin glargine-yfgn  10 Units Subcutaneous Daily   multivitamin  1 tablet Oral QHS   mouth rinse  15  mL Mouth Rinse 4 times per day   pantoprazole (PROTONIX) IV  40 mg Intravenous Q12H   QUEtiapine  50 mg  Oral BID   sevelamer carbonate  2.4 g Oral TID WC   Continuous Infusions:  sodium chloride Stopped (08/30/22 0033)    LOS: 31 days   Raiford Noble, DO [facility] Available via Epic secure chat 7am-7pm After these hours, please refer to coverage provider listed on [email or web address] 09/24/2022, 7:06 PM

## 2022-09-25 LAB — COMPREHENSIVE METABOLIC PANEL
ALT: 38 U/L (ref 0–44)
AST: 52 U/L — ABNORMAL HIGH (ref 15–41)
Albumin: 3.6 g/dL (ref 3.5–5.0)
Alkaline Phosphatase: 84 U/L (ref 38–126)
Anion gap: 13 (ref 5–15)
BUN: 38 mg/dL — ABNORMAL HIGH (ref 6–20)
CO2: 27 mmol/L (ref 22–32)
Calcium: 9.3 mg/dL (ref 8.9–10.3)
Chloride: 93 mmol/L — ABNORMAL LOW (ref 98–111)
Creatinine, Ser: 4.96 mg/dL — ABNORMAL HIGH (ref 0.61–1.24)
GFR, Estimated: 13 mL/min — ABNORMAL LOW (ref >60)
Glucose, Bld: 96 mg/dL (ref 70–99)
Potassium: 3.9 mmol/L (ref 3.5–5.1)
Sodium: 133 mmol/L — ABNORMAL LOW (ref 135–145)
Total Bilirubin: 0.5 mg/dL (ref 0.3–1.2)
Total Protein: 8 g/dL (ref 6.5–8.1)

## 2022-09-25 LAB — GLUCOSE, CAPILLARY
Glucose-Capillary: 113 mg/dL — ABNORMAL HIGH (ref 70–99)
Glucose-Capillary: 188 mg/dL — ABNORMAL HIGH (ref 70–99)
Glucose-Capillary: 156 mg/dL — ABNORMAL HIGH (ref 70–99)
Glucose-Capillary: 126 mg/dL — ABNORMAL HIGH (ref 70–99)

## 2022-09-25 LAB — CBC WITH DIFFERENTIAL/PLATELET
Abs Immature Granulocytes: 0.11 10*3/uL — ABNORMAL HIGH (ref 0.00–0.07)
Basophils Absolute: 0 10*3/uL (ref 0.0–0.1)
Basophils Relative: 0 %
Eosinophils Absolute: 0 10*3/uL (ref 0.0–0.5)
Eosinophils Relative: 0 %
HCT: 34.7 % — ABNORMAL LOW (ref 39.0–52.0)
Hemoglobin: 11.6 g/dL — ABNORMAL LOW (ref 13.0–17.0)
Immature Granulocytes: 1 %
Lymphocytes Relative: 9 %
Lymphs Abs: 1 10*3/uL (ref 0.7–4.0)
MCH: 23 pg — ABNORMAL LOW (ref 26.0–34.0)
MCHC: 33.4 g/dL (ref 30.0–36.0)
MCV: 68.8 fL — ABNORMAL LOW (ref 80.0–100.0)
Monocytes Absolute: 1.3 10*3/uL — ABNORMAL HIGH (ref 0.1–1.0)
Monocytes Relative: 13 %
Neutro Abs: 8.1 10*3/uL — ABNORMAL HIGH (ref 1.7–7.7)
Neutrophils Relative %: 77 %
Platelets: 301 10*3/uL (ref 150–400)
RBC: 5.04 MIL/uL (ref 4.22–5.81)
RDW: 19.7 % — ABNORMAL HIGH (ref 11.5–15.5)
WBC: 10.6 10*3/uL — ABNORMAL HIGH (ref 4.0–10.5)
nRBC: 0 % (ref 0.0–0.2)

## 2022-09-25 LAB — MAGNESIUM: Magnesium: 1.9 mg/dL (ref 1.7–2.4)

## 2022-09-25 LAB — PHOSPHORUS: Phosphorus: 3 mg/dL (ref 2.5–4.6)

## 2022-09-25 MED ORDER — SODIUM CHLORIDE 0.9 % IV BOLUS
500.0000 mL | Freq: Once | INTRAVENOUS | Status: AC
  Administered 2022-09-25: 500 mL via INTRAVENOUS

## 2022-09-25 NOTE — Progress Notes (Addendum)
  [facility] Progress Note   Subjective:   seen in room, no c/o  Objective Vitals:   09/24/22 2008 09/25/22 0400 09/25/22 0812 09/25/22 1141  BP: 115/68 110/62 97/62 (!) 89/55  Pulse: 87  79 75  Resp: '16  17 17  '$ Temp: 98.1 F (36.7 C) 98.8 F (37.1 C) 97.6 F (36.4 C) 98.7 F (37.1 C)  TempSrc: Oral Oral Oral Oral  SpO2: 99% 99% 100% 98%  Weight:      Height:       Physical Exam General: awake, alert, wrote his name on a sheet of paper Heart: RRR, no murmurs, rubs or gallops Lungs: CTA anteriorly Abdomen: Soft, non-distended, +BS Extremities: no edema b/l lower extremties Neuro: as above Dialysis Access:  LUE AVF + bruit, R IJ Tunneled  HD catheter c/d/i    Dialysis Orders: SW MWF 3h 47mn  80kg   400/800   2/2 bath  Hep 2000  LFA AVF - last HD 11/13, post 80.1kg - mircera 150 q2, last 11/13 - hectorol 431m IV q HD  Assessment/Plan: 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 S/P course of hypertonic fluids.  AMS - due to #1, sig improvement this week.  HTN/ vol  - getting po BP meds x 3, BP's good, no edema on exam, new lower dry wt ~72kg. 2.5 L off w/ HD yesterday. ESRD - pt required CRRT 11/15-11/17. Resumed MWF sched w/ HD yesterday. Next HD Monday.  HD access - BUN was very high so access recirc was suspected. IR did fistulogram 12/4 and AVF was found be small with competing veins. Using [place]IJ [place] placed by IR on 12/6.  Anemia of ESRD: Hgb 9s, continue Aranesp 1507mweekly while here. Secondary HPTH: CCa ok. Renvela 3 ac tid as binder. Continue VDRA.  DM2 - per pmd  RobKelly SplinterD 09/25/2022, 11:48 AM  Recent Labs  Lab 09/24/22 0958 09/25/22 0358  HGB 10.9* 11.6*  ALBUMIN 3.4* 3.6  CALCIUM 9.0 9.3  PHOS 3.9 3.0  CREATININE 6.73* 4.96*  K 3.7 3.9     Inpatient medications:  amLODipine  5 mg Oral BID   carvedilol  25 mg Oral BID WC   Chlorhexidine Gluconate Cloth  6 each Topical Q0600    Chlorhexidine Gluconate Cloth  6 each Topical Q0600   darbepoetin (ARANESP) injection - DIALYSIS  150 mcg Subcutaneous Q Mon-1800   feeding supplement (NEPRO CARB STEADY)  237 mL Oral TID WC   heparin injection (subcutaneous)  5,000 Units Subcutaneous Q8H   hydrALAZINE  100 mg Oral Q8H   insulin aspart  0-15 Units Subcutaneous Q6H   insulin glargine-yfgn  10 Units Subcutaneous Daily   multivitamin  1 tablet Oral QHS   mouth rinse  15 mL Mouth Rinse 4 times per day   pantoprazole (PROTONIX) IV  40 mg Intravenous Q12H   sevelamer carbonate  2.4 g Oral TID WC    sodium chloride Stopped (08/30/22 0033)   sodium chloride, acetaminophen (TYLENOL) oral liquid 160 mg/5 mL, bisacodyl, fentaNYL (SUBLIMAZE) injection, hydrALAZINE, hydrOXYzine, labetalol, LORazepam, ondansetron (ZOFRAN) IV, mouth rinse

## 2022-09-25 NOTE — Progress Notes (Signed)
PROGRESS NOTE    Randy Ross  PQZ:[id number] DOB: 1971/07/22 DOA: 08/24/2022 PCP: Medicine, [facility]   Brief Narrative:  Randy Ross is a 51 y.o. male with past medical history significant for ESRD on HD, DM2, essential hypertension who presented to [facility] ED with sudden onset slurred speech, headache.  Workup in the ER with elevated BP of 260/140 and CT head showing cerebellar hemorrhage.  Patient was initially intubated in the ED for airway protection and admitted to the PCCM/neurology service.   Significant Hospital events: 11/14: Admitted for acute cerebellar hemorrhage; intubated in the ER, started on hypertonic saline 11/15: Neurosurgery were consulted, recommended against surgical evacuation 11/16: Cefepime started for suspected aspiration 11/17: Required 1u PRBC transfusion due to CRRT filter 11/19: EEG nonspecific 11/21: MRI brain shows new scattered bilateral acute infarcts 11/22: Completed 7 days antibiotics 11/24: IR consulted given recirculation syndrome, high BUN; plan for fistulogram pending 11/27: Temp cath HD cath placed  11/30: Self-extubated 12/1: Persistent delirium 12/2: Transferred out of ICU to [facility] service 12/3: Pulled out cortrak 12/4: Failed SLP, needs cortrak, fistulogram shows poor outflow 12/5: TDC placed, feeding tube replaced 12/6: HD catheter placed by IR 12/9: Family meeting with palliative care, family to decide on possible transitioning to comfort measures  12/11: Family decided to continue aggressive measures, IR consulted for PEG tube 12/12: SLP requesting MBS prior to PEG tube placement; MBS likely plan for 12/12 12/13: MBS done and recommending dysphagia 1 diet with honey thick liquids.  Will see how the patient tolerates p.o. intake and obtain a calorie count prior to initiation of a PEG.  Will continue the Nepro at 50 MLS per hour and Prosource tube feedings once a day as well as considering adjusting if he has  good p.o. intake 12/15: Patient doing fairly well with his calorie count so we will remove his pain to feeding tube and the dietitian adjusted his oral supplements.  He is a little agitated still so required soft mitten restraints.  Allegedly rolled out of the bed but had no acute injuries as a nurse over there.  12/16 he received Seroquel and was not really interactive during the encounter so we will discontinue.  Will not renew soft mitten restraints as we will try and get him to SNF and he needs to be monitored carefully.  Apparently from nursing that he is talking more.  He is back on his Monday Wednesday Friday schedule for hemodialysis and continues to have improvement in his mental status slowly over the week  Assessment and Plan:  Acute Cerebellar hemorrhage 2/2 poorly controlled hypertension Cerebral edema -Patient presenting to ED via EMS with mental status change, left-sided weakness, slurred speech and left-sided gaze preference.   -Patient was noted to be severely hypertensive on admission.   -Patient was subsequently intubated for airway protection and CT notable for IPH right cerebellar hemisphere with mass effect.   -Patient was started on Cleviprex, Versed and admitted under the critical care/neurology service.  Patient was seen by neurosurgery who did not recommend surgical intervention; and patient was started on hypertonic saline and placed on CRRT to minimize rapid fluid shifts. -Mental Status is improving , repeat CT head x 3 remained stable overall very poor prognosis and palliative care following.   -Neurology now signed off.   -WBC went from 12.6 -> 11.3 -> 10.5 -> 9.7 -> 10.6 -And CRRT converted back to HD per nephrology.  Family meeting planned for 12/9; family now to discuss amongst themselves  whether transitioning to comfort measures versus need for PEG tube and SNF placement. -SLP evaluating for Congitive/Language -Will D/C Seroquel and remove mitten restraints given  that he will need Rehab -Will hold off PEG currently given MBS results as below and now he is tolerating at diet    Dysphagia, improving  -Etiology low please secondary to persistent encephalopathy, patient has pulled out core track Randy Ross to, replaced today.   -Palliative care following, plan for family meeting on 12/9 and if no progression towards oral intake and family still wanting aggressive measures will need PEG tube placed if fails his Diet Trial. -Continue tube feeds via core track  -SLP to repeat MBS 12/13 and now on Dysphagia 1 Puree Solids with Honey Thick Liquids; Doing well and Eating well per nursing  -IR consulted for PEG tube placement but will hold of -Aspiration precautions -Will trial Calorie Count prior with Diet as above and TF and if fails will need PEG; Calorie Count ongoing and dietitian feels patient will do well so will stop Calorie Count and D/C Cortrak    Lower GI Bleeding: Resolved -Noted by nurse, transient, no change in hemoglobin.  No further bleeding reported. -Continue intermittent monitoring of hemoglobin -Hgb/Hct went from 10.6/32.0 -> 10.7/32.7 -> 11.0/34.2 -> 11.6/34.7 -Continue to monitor for signs and symptoms of bleeding -Repeat CBC in a.m.   Acute Embolic CVA -MR brain 81/44 with many new acute infarcts throughout the bilateral frontal and parietal white matter consistent with embolism.   -TTE with LVEF 50-55%.  LDL 91, hemoglobin A1c 5.9.  -Holding antiplatelet for anemia, cerebellar hemorrhage.  -Neurology now signed off; and do not recommend further evaluation for cardiac source of embolism as he is not a good long-term anticoagulation candidate.   Hypertensive Emergency Hx Essential Hypertension -BP was severely elevated on admission, likely leading to acute cerebellar hemorrhage. -Continuing Amlodipine 5 mg per tube twice daily, Carvedilol 25 mg per tube twice daily, Hydralazine 100 mg per tube every 8 hours -Continue to Monitor BP per  Protocol  -Last BP reading was 94/72   Acute Respiratory Failure with Hypoxia: Resolved Aspiration pneumonia -Patient was initially intubated for airway protection upon ED arrival, patient self-extubated on 11/30.   -SpO2: 99 % O2 Flow Rate (L/min): 2 L/min FiO2 (%): 30 % -Completed course of antibiotics with cefepime followed by ceftriaxone.   Acute Metabolic Encephalopathy -Patient continues to not be able to follow commands, likely secondary to brain insult but could be a language barrier -SLP consulted for Congnitive and Language Eval  -C/w Supportive care, Seroquel but may need to adjust    Type 2 Diabetes Mellitus -Hemoglobin A1c 5.9 08/25/2022, well-controlled. -C/w Semglee 10 units Parchment daily -C/w Moderate SSI for coverage -CBGs qAC/HS; CBGs ranging from 126-188   ESRD on HD Elevated AG -Continue HD per nephrology and patient is going for Dialysis today  -Patient's BUN/Cr went from 160/10.17 -> 79/6.60 -> 135/8.91 -> 73/6.73 -> 38/4.96 -Patient's AG is improved and CO2  is now 27, Chloride Level is 93, AG is 13 -Avoid further nephrotoxic medications, contrast dyes, hypotension and dehydration to ensure adequate renal perfusion and will and renally dose medications -Repeat CMP in the a.m.   Anemia of Chronic Medical/renal disease -Hemoglobin stable, continue intermittent monitoring.  -Transfuse hemoglobin < 7.0 -Patient's hemoglobin/hematocrit went from 10.6/32.0 -> 10.7/32.7 -> 11.0/34.2 -> 10.9/33.6 -> 11.6/34.7 with an MCV of 68.8 -Repeat CBC in a.m.   Hyponatremia -Patient's sodium went from 131 -> 130 -> 129 x2 ->  133 -Continue to Monitor and Trend and repeat CMP in the AM    Hypoalbuminemia -Patient's Albumin Level is now 3.4 x2 -Continue to Monitor and Trend -Repeat CMP in the AM    Clotted Fistula -Fistulogram showed poor outflow from fistula, nephrology consulted IR for HD catheter placement which was performed on 12/6.  DVT prophylaxis: heparin injection  5,000 Units Start: 09/12/22 1445 Place and maintain sequential compression device Start: 08/25/22 1025    Code Status: Full Code Family Communication: Discussed with family at bedside  Disposition Plan:  Level of care: Telemetry Medical Status is: Inpatient Remains inpatient appropriate because: Needs further clinical improvement and needs to be out of restraints in order to go to SNF and rehab   Consultants:  Nephrology [facility] Neurosurgery Nephrology Interventional radiology Palliative care  Procedures:  HD catheter placement  Antimicrobials:  Anti-infectives (From admission, onward)    Start     Dose/Rate Route Frequency Ordered Stop   09/14/22 1145  ceFAZolin (ANCEF) IVPB 2g/100 mL premix        2 g 200 mL/hr over 30 Minutes Intravenous To Radiology 09/14/22 1123 09/15/22 0905   08/29/22 2000  cefTRIAXone (ROCEPHIN) 2 g in sodium chloride 0.9 % 100 mL IVPB        2 g 200 mL/hr over 30 Minutes Intravenous Every 24 hours 08/29/22 1009 09/01/22 2146   08/28/22 2000  ceFEPIme (MAXIPIME) 1 g in sodium chloride 0.9 % 100 mL IVPB  Status:  Discontinued        1 g 200 mL/hr over 30 Minutes Intravenous Every 24 hours 08/27/22 1009 08/27/22 1016   08/27/22 2000  ceFEPIme (MAXIPIME) 1 g in sodium chloride 0.9 % 100 mL IVPB  Status:  Discontinued        1 g 200 mL/hr over 30 Minutes Intravenous Every 24 hours 08/27/22 1016 08/29/22 1009   08/26/22 1000  ceFEPIme (MAXIPIME) 2 g in sodium chloride 0.9 % 100 mL IVPB  Status:  Discontinued        2 g 200 mL/hr over 30 Minutes Intravenous Every 12 hours 08/26/22 0850 08/27/22 1009       Subjective: Seen and examined at bedside he was somnolent and little bit drowsy after receiving Seroquel.  Did not really wake up to interact with me.  Per family at bedside states that he is eating more and talking with them.  Still remains agitated and confused at times.  No other concerns or complaints this time.  Objective: Vitals:   09/25/22 1415  09/25/22 1417 09/25/22 1422 09/25/22 1655  BP: (!) 74/55 (!) '86/52 92/62 94/72 '$  Pulse:   79 74  Resp:   18 17  Temp:    98.1 F (36.7 C)  TempSrc:    Oral  SpO2:   98% 99%  Weight:      Height:       No intake or output data in the 24 hours ending 09/25/22 1854 Filed Weights   09/23/22 1758 09/24/22 1440 09/24/22 1839  Weight: 73.5 kg 74.7 kg 72.1 kg   Examination: Physical Exam:  Constitutional: WN/WD overweight male in no acute distress Respiratory: Diminished to auscultation bilaterally, no wheezing, rales, rhonchi or crackles.  Unlabored breathing and no accessory muscle usage Cardiovascular: RRR, no murmurs / rubs / gallops. S1 and S2 auscultated. No extremity edema Abdomen: Soft, non-tender, non-distended.  Bowel sounds positive.  GU: Deferred. Musculoskeletal: No clubbing / cyanosis of digits/nails. No joint deformity upper and lower extremities.  Skin:  No rashes, lesions, ulcers limited skin evaluation. No induration; Warm and dry.  Neurologic: Somnolent and drowsy and difficult to arouse given his current condition Psychiatric: Impaired judgement and insight   Data Reviewed: I have personally reviewed following labs and imaging studies  CBC: Recent Labs  Lab 09/21/22 1441 09/22/22 0343 09/23/22 0546 09/24/22 0958 09/25/22 0358  WBC 12.6* 11.3* 10.5 9.7 10.6*  NEUTROABS 10.1*  --  8.2* 7.7 8.1*  HGB 10.6* 10.7* 11.0* 10.9* 11.6*  HCT 32.0* 32.7* 34.2* 33.6* 34.7*  MCV 68.5* 68.0* 68.5* 68.6* 68.8*  PLT 349 324 319 290 196   Basic Metabolic Panel: Recent Labs  Lab 09/21/22 1500 09/22/22 0343 09/23/22 0546 09/24/22 0958 09/25/22 0358  NA 131* 130* 129* 129* 133*  K 4.6 3.9 4.4 3.7 3.9  CL 88* 91* 88* 90* 93*  CO2 '24 25 24 26 27  '$ GLUCOSE 125* 108* 111* 188* 96  BUN 160* 79* 135* 73* 38*  CREATININE 10.17* 6.60* 8.91* 6.73* 4.96*  CALCIUM 9.8 9.3 9.6 9.0 9.3  MG  --   --  2.4 2.0 1.9  PHOS 5.2*  --  4.9* 3.9 3.0   GFR: Estimated Creatinine  Clearance: 15.3 mL/min (A) (by C-G formula based on SCr of 4.96 mg/dL (H)). Liver Function Tests: Recent Labs  Lab 09/21/22 1500 09/23/22 0546 09/24/22 0958 09/25/22 0358  AST  --  27 46* 52*  ALT  --  14 33 38  ALKPHOS  --  72 70 84  BILITOT  --  0.3 0.4 0.5  PROT  --  7.8 7.6 8.0  ALBUMIN 3.2* 3.4* 3.4* 3.6   No results for input(s): "LIPASE", "AMYLASE" in the last 168 hours. No results for input(s): "AMMONIA" in the last 168 hours. Coagulation Profile: No results for input(s): "INR", "PROTIME" in the last 168 hours. Cardiac Enzymes: No results for input(s): "CKTOTAL", "CKMB", "CKMBINDEX", "TROPONINI" in the last 168 hours. BNP (last 3 results) No results for input(s): "PROBNP" in the last 8760 hours. HbA1C: No results for input(s): "HGBA1C" in the last 72 hours. CBG: Recent Labs  Lab 09/24/22 1207 09/25/22 0608 09/25/22 1142 09/25/22 1334 09/25/22 1724  GLUCAP 210* 113* 188* 156* 126*   Lipid Profile: Recent Labs    09/23/22 0546  TRIG 76   Thyroid Function Tests: No results for input(s): "TSH", "T4TOTAL", "FREET4", "T3FREE", "THYROIDAB" in the last 72 hours. Anemia Panel: No results for input(s): "VITAMINB12", "FOLATE", "FERRITIN", "TIBC", "IRON", "RETICCTPCT" in the last 72 hours. Sepsis Labs: No results for input(s): "PROCALCITON", "LATICACIDVEN" in the last 168 hours.  No results found for this or any previous visit (from the past 240 hour(s)).   Radiology Studies: No results found.  Scheduled Meds:  amLODipine  5 mg Oral BID   carvedilol  25 mg Oral BID WC   Chlorhexidine Gluconate Cloth  6 each Topical Q0600   Chlorhexidine Gluconate Cloth  6 each Topical Q0600   darbepoetin (ARANESP) injection - DIALYSIS  150 mcg Subcutaneous Q Mon-1800   feeding supplement (NEPRO CARB STEADY)  237 mL Oral TID WC   heparin injection (subcutaneous)  5,000 Units Subcutaneous Q8H   hydrALAZINE  100 mg Oral Q8H   insulin aspart  0-15 Units Subcutaneous Q6H    insulin glargine-yfgn  10 Units Subcutaneous Daily   multivitamin  1 tablet Oral QHS   mouth rinse  15 mL Mouth Rinse 4 times per day   pantoprazole (PROTONIX) IV  40 mg Intravenous Q12H   sevelamer carbonate  2.4 g Oral TID WC   Continuous Infusions:  sodium chloride Stopped (08/30/22 0033)    LOS: 65 days   Raiford Noble, DO [facility] Available via Epic secure chat 7am-7pm After these hours, please refer to coverage provider listed on [email or web address] 09/25/2022, 6:54 PM

## 2022-09-25 NOTE — Evaluation (Signed)
Occupational Therapy Evaluation Patient Details Name: Randy Ross MRN: [id number] DOB: 02/09/1971 Today's Date: 09/25/2022   History of Present Illness 51 y.o. male presented 08/24/22 with slurred speech and headache. Head CT Rt cerebellar hemorrhage; intubated 11/14, self-extubated 11/30; CRRT; MRI brain 11/21 many small acute infarcts; PMH significant for end-stage renal disease on intermittent hemodialysis, diabetes, hypertension   Clinical Impression   Pt presenting with problem above with deficits listed below. Pt performing LB ADL with min-mod A and UB ADL with min A. Session limited by low BP (see general comments); RN present and agreeable for OT to attempt one STS transfer and pt requiring min A for rise as well as standing balance. Pt with decreased balance, strength, activity tolerance, verbal communication (although did state he was tired after standing), and cognition. Pt very pleasant and motivated to participate in therapy. Due to pt age, motivation, and family support, recommending AIR to optimize safety and independence in ADL and IADL.      Recommendations for follow up therapy are one component of a multi-disciplinary discharge planning process, led by the attending physician.  Recommendations may be updated based on patient status, additional functional criteria and insurance authorization.   Follow Up Recommendations  Acute inpatient rehab (3hours/day)     Assistance Recommended at Discharge Frequent or constant Supervision/Assistance  Patient can return home with the following A little help with walking and/or transfers;A little help with bathing/dressing/bathroom;Assistance with feeding;Direct supervision/assist for medications management;Direct supervision/assist for financial management;Assist for transportation;Help with stairs or ramp for entrance    Functional Status Assessment  Patient has had a recent decline in their functional status and demonstrates the  ability to make significant improvements in function in a reasonable and predictable amount of time.  Equipment Recommendations  Other (comment) (defer to next venue)    Recommendations for Other Services Rehab consult     Precautions / Restrictions Precautions Precautions: Fall;Other (comment) Precaution Comments: pulled out multiple cortraks Restrictions Weight Bearing Restrictions: No      Mobility Bed Mobility Overal bed mobility: Needs Assistance Bed Mobility: Supine to Sit, Sit to Supine     Supine to sit: Min guard, HOB elevated Sit to supine: Min assist   General bed mobility comments: Min A to bring second foot back into bed    Transfers Overall transfer level: Needs assistance Equipment used: 1 person hand held assist, 2 person hand held assist Transfers: Sit to/from Stand Sit to Stand: Min assist           General transfer comment: Min A to rise.      Balance Overall balance assessment: Needs assistance Sitting-balance support: No upper extremity supported, Feet unsupported Sitting balance-Leahy Scale: Fair Sitting balance - Comments: Min guard A for safety   Standing balance support: Bilateral upper extremity supported, Single extremity supported, During functional activity Standing balance-Leahy Scale: Poor Standing balance comment: Min A for standing balance                           ADL either performed or assessed with clinical judgement   ADL Overall ADL's : Needs assistance/impaired Eating/Feeding: Supervision/ safety;Bed level Eating/Feeding Details (indicate cue type and reason): drinking from protein shake. Poor oral motor skills. Requires full supervision. Able to bring drink to mouth with straw Grooming: Wash/dry face;Bed level Grooming Details (indicate cue type and reason): bed level Upper Body Bathing: Minimal assistance;Bed level   Lower Body Bathing: Minimal assistance;Bed level   Upper  Body Dressing : Minimal  assistance;Bed level   Lower Body Dressing: Minimal assistance;Bed level Lower Body Dressing Details (indicate cue type and reason): donning socks in bed; min A due to could not reach foot with both hands Toilet Transfer: Minimal assistance Toilet Transfer Details (indicate cue type and reason): STS only this session due to low blood pressure         Functional mobility during ADLs: Minimal assistance (for STS) General ADL Comments: Following commands, Low BP see general comments; RN present and agreeable to one STS     Vision Baseline Vision/History: 1 Wears glasses Ability to See in Adequate Light: 0 Adequate Patient Visual Report: No change from baseline Additional Comments: denies changes in vision with skiking head no. will continue to assess     Perception Perception Perception Tested?: No   Praxis Praxis Praxis tested?: Not tested    Pertinent Vitals/Pain Pain Assessment Pain Assessment: Faces Faces Pain Scale: No hurt Pain Intervention(s): Monitored during session     Hand Dominance     Extremity/Trunk Assessment Upper Extremity Assessment Upper Extremity Assessment: Generalized weakness;RUE deficits/detail;LUE deficits/detail RUE Deficits / Details: 3+/5 grip strength, decreasd coordination LUE Deficits / Details: 3+/5 grip strength, decreasd coordination   Lower Extremity Assessment Lower Extremity Assessment: Defer to PT evaluation       Communication Communication Communication: Other (comment) (limited attempts to verbalize and when he does, some is relevant/makes sense, and some verbalizations seem mumbled/unintelligible)   Cognition Arousal/Alertness: Awake/alert Behavior During Therapy: Flat affect Overall Cognitive Status: Impaired/Different from baseline Area of Impairment: Attention, Following commands, Awareness, Problem solving                   Current Attention Level: Sustained   Following Commands: Follows one step commands  consistently, Follows one step commands with increased time   Awareness: Intellectual Problem Solving: Slow processing, Difficulty sequencing, Requires verbal cues General Comments: Pt following >80% of verbal commands, and requiring cues intermittently; difficulty understanding commands for MMT, but following commands for routine tasks well with no cues for problem solving (donning socks). Very few attempts at verbalizing; was observed to verbalize ~3x, but all additional verbalizations (3) very difficult to understand.     General Comments  Per RN, pt with good family support. Pt with one bout of emesis after RN providing shake and cueing to swallow. BP in chair poisition at 73/62 (64) initially. Then 96/75 (83) after 1 minute. Pt's RN agreeable to attempt to stand and present; BP dropping to 74/55; upon return to supine back up to 92/62 within ~1 minute    Exercises     Shoulder Instructions      Home Living Family/patient expects to be discharged to:: Unsure   Available Help at Discharge: Family                             Additional Comments: Pt reports that he lives with his family and his home has stairs, very limited verbalizations and pt unsure what kind of shower he has      Prior Functioning/Environment Prior Level of Function : Independent/Modified Independent             Mobility Comments: assumed independent; pt unable to state; no family present          OT Problem List: Decreased strength;Decreased activity tolerance;Impaired balance (sitting and/or standing);Decreased coordination;Decreased cognition;Decreased safety awareness;Decreased knowledge of use of DME or AE;Impaired UE functional use  OT Treatment/Interventions: Self-care/ADL training;Therapeutic exercise;DME and/or AE instruction;Therapeutic activities;Cognitive remediation/compensation;Patient/family education;Balance training    OT Goals(Current goals can be found in the care plan  section) Acute Rehab OT Goals Patient Stated Goal: unable to state; limited verbalizations OT Goal Formulation: With patient Time For Goal Achievement: 10/09/22 Potential to Achieve Goals: Good  OT Frequency: Min 2X/week    Co-evaluation              AM-PAC OT "6 Clicks" Daily Activity     Outcome Measure Help from another person eating meals?: A Little Help from another person taking care of personal grooming?: A Little Help from another person toileting, which includes using toliet, bedpan, or urinal?: A Lot Help from another person bathing (including washing, rinsing, drying)?: A Lot Help from another person to put on and taking off regular upper body clothing?: A Little Help from another person to put on and taking off regular lower body clothing?: A Little 6 Click Score: 16   End of Session Equipment Utilized During Treatment: Gait belt Nurse Communication: Mobility status  Activity Tolerance: Patient tolerated treatment well;Other (comment);Treatment limited secondary to medical complications (Comment) (limited by BP and emesis. Asymptomatic for low BP) Patient left: in bed;with call bell/phone within reach;with bed alarm set;with nursing/sitter in room (RN to reapply restraints)  OT Visit Diagnosis: Unsteadiness on feet (R26.81);Muscle weakness (generalized) (M62.81);Other abnormalities of gait and mobility (R26.89);Other symptoms and signs involving cognitive function;Cognitive communication deficit (R41.841) Symptoms and signs involving cognitive functions: Nontraumatic SAH                Time: 1350-1425 OT Time Calculation (min): 35 min Charges:  OT General Charges $OT Visit: 1 Visit OT Evaluation $OT Eval Moderate Complexity: 1 Mod OT Treatments $Self Care/Home Management : 8-22 mins  Elder Cyphers, OTR/L [facility] Acute Rehabilitation Office: [phone]   Magnus Ivan 09/25/2022, 2:50 PM

## 2022-09-26 LAB — COMPREHENSIVE METABOLIC PANEL
ALT: 37 U/L (ref 0–44)
AST: 36 U/L (ref 15–41)
Albumin: 3.4 g/dL — ABNORMAL LOW (ref 3.5–5.0)
Alkaline Phosphatase: 81 U/L (ref 38–126)
Anion gap: 14 (ref 5–15)
BUN: 64 mg/dL — ABNORMAL HIGH (ref 6–20)
CO2: 25 mmol/L (ref 22–32)
Calcium: 9 mg/dL (ref 8.9–10.3)
Chloride: 92 mmol/L — ABNORMAL LOW (ref 98–111)
Creatinine, Ser: 7.62 mg/dL — ABNORMAL HIGH (ref 0.61–1.24)
GFR, Estimated: 8 mL/min — ABNORMAL LOW (ref >60)
Glucose, Bld: 57 mg/dL — ABNORMAL LOW (ref 70–99)
Potassium: 3.7 mmol/L (ref 3.5–5.1)
Sodium: 131 mmol/L — ABNORMAL LOW (ref 135–145)
Total Bilirubin: 0.5 mg/dL (ref 0.3–1.2)
Total Protein: 7.5 g/dL (ref 6.5–8.1)

## 2022-09-26 LAB — GLUCOSE, CAPILLARY
Glucose-Capillary: 127 mg/dL — ABNORMAL HIGH (ref 70–99)
Glucose-Capillary: 128 mg/dL — ABNORMAL HIGH (ref 70–99)
Glucose-Capillary: 129 mg/dL — ABNORMAL HIGH (ref 70–99)
Glucose-Capillary: 161 mg/dL — ABNORMAL HIGH (ref 70–99)
Glucose-Capillary: 99 mg/dL (ref 70–99)

## 2022-09-26 LAB — CBC WITH DIFFERENTIAL/PLATELET
Abs Immature Granulocytes: 0.04 10*3/uL (ref 0.00–0.07)
Basophils Absolute: 0.1 10*3/uL (ref 0.0–0.1)
Basophils Relative: 1 %
Eosinophils Absolute: 0.2 10*3/uL (ref 0.0–0.5)
Eosinophils Relative: 2 %
HCT: 33.8 % — ABNORMAL LOW (ref 39.0–52.0)
Hemoglobin: 10.8 g/dL — ABNORMAL LOW (ref 13.0–17.0)
Immature Granulocytes: 0 %
Lymphocytes Relative: 17 %
Lymphs Abs: 1.7 10*3/uL (ref 0.7–4.0)
MCH: 22.4 pg — ABNORMAL LOW (ref 26.0–34.0)
MCHC: 32 g/dL (ref 30.0–36.0)
MCV: 70 fL — ABNORMAL LOW (ref 80.0–100.0)
Monocytes Absolute: 1.5 10*3/uL — ABNORMAL HIGH (ref 0.1–1.0)
Monocytes Relative: 15 %
Neutro Abs: 6.3 10*3/uL (ref 1.7–7.7)
Neutrophils Relative %: 65 %
Platelets: 319 10*3/uL (ref 150–400)
RBC: 4.83 MIL/uL (ref 4.22–5.81)
RDW: 19.8 % — ABNORMAL HIGH (ref 11.5–15.5)
WBC: 9.7 10*3/uL (ref 4.0–10.5)
nRBC: 0.2 % (ref 0.0–0.2)

## 2022-09-26 LAB — MAGNESIUM: Magnesium: 2 mg/dL (ref 1.7–2.4)

## 2022-09-26 LAB — PHOSPHORUS: Phosphorus: 4.9 mg/dL — ABNORMAL HIGH (ref 2.5–4.6)

## 2022-09-26 LAB — TRIGLYCERIDES: Triglycerides: 66 mg/dL (ref <150)

## 2022-09-26 MED ORDER — CHLORHEXIDINE GLUCONATE CLOTH 2 % EX PADS
6.0000 | MEDICATED_PAD | Freq: Every day | CUTANEOUS | Status: DC
  Administered 2022-09-27 – 2022-10-01 (×5): 6 via TOPICAL

## 2022-09-26 MED ORDER — QUETIAPINE FUMARATE 25 MG PO TABS
25.0000 mg | ORAL_TABLET | Freq: Every day | ORAL | Status: DC
  Administered 2022-09-26 – 2022-10-13 (×18): 25 mg via ORAL
  Filled 2022-09-26 (×18): qty 1

## 2022-09-26 NOTE — Progress Notes (Signed)
PROGRESS NOTE    Randy Ross  MPN:[id number] DOB: Apr 01, 1971 DOA: 08/24/2022 PCP: Medicine, [facility]   Brief Narrative:  Randy Ross is a 51 y.o. male with past medical history significant for ESRD on HD, DM2, essential hypertension who presented to [facility] ED with sudden onset slurred speech, headache.  Workup in the ER with elevated BP of 260/140 and CT head showing cerebellar hemorrhage.  Patient was initially intubated in the ED for airway protection and admitted to the PCCM/neurology service.   Significant Hospital events: 11/14: Admitted for acute cerebellar hemorrhage; intubated in the ER, started on hypertonic saline 11/15: Neurosurgery were consulted, recommended against surgical evacuation 11/16: Cefepime started for suspected aspiration 11/17: Required 1u PRBC transfusion due to CRRT filter 11/19: EEG nonspecific 11/21: MRI brain shows new scattered bilateral acute infarcts 11/22: Completed 7 days antibiotics 11/24: IR consulted given recirculation syndrome, high BUN; plan for fistulogram pending 11/27: Temp cath HD cath placed  11/30: Self-extubated 12/1: Persistent delirium 12/2: Transferred out of ICU to [facility] service 12/3: Pulled out cortrak 12/4: Failed SLP, needs cortrak, fistulogram shows poor outflow 12/5: TDC placed, feeding tube replaced 12/6: HD catheter placed by IR 12/9: Family meeting with palliative care, family to decide on possible transitioning to comfort measures  12/11: Family decided to continue aggressive measures, IR consulted for PEG tube 12/12: SLP requesting MBS prior to PEG tube placement; MBS likely plan for 12/12 12/13: MBS done and recommending dysphagia 1 diet with honey thick liquids.  Will see how the patient tolerates p.o. intake and obtain a calorie count prior to initiation of a PEG.  Will continue the Nepro at 50 MLS per hour and Prosource tube feedings once a day as well as considering adjusting if he has  good p.o. intake 12/15: Patient doing fairly well with his calorie count so we will remove his pain to feeding tube and the dietitian adjusted his oral supplements.  He is a little agitated still so required soft mitten restraints.  Allegedly rolled out of the bed but had no acute injuries as a nurse over there.   12/16 he received Seroquel and was not really interactive during the encounter so we will discontinue.  Will not renew soft mitten restraints as we will try and get him to SNF and he needs to be monitored carefully.  Apparently from nursing that he is talking more.  He is back on his Monday Wednesday Friday schedule for hemodialysis and continues to have improvement in his mental status slowly over the week  Assessment and Plan:  Acute Cerebellar hemorrhage 2/2 poorly controlled hypertension Cerebral edema -Patient presenting to ED via EMS with mental status change, left-sided weakness, slurred speech and left-sided gaze preference.   -Patient was noted to be severely hypertensive on admission.   -Patient was subsequently intubated for airway protection and CT notable for IPH right cerebellar hemisphere with mass effect.   -Patient was started on Cleviprex, Versed and admitted under the critical care/neurology service.  Patient was seen by neurosurgery who did not recommend surgical intervention; and patient was started on hypertonic saline and placed on CRRT to minimize rapid fluid shifts. -Mental Status is improving , repeat CT head x 3 remained stable overall very poor prognosis and palliative care following.   -Neurology now signed off.   -WBC went from 12.6 -> 11.3 -> 10.5 -> 9.7 -> 10.6 -> 9.7 -Was on CRRT converted back to HD per nephrology and is improving.  Likely will pursue SNF  placement given that he is improving -SLP evaluating for Congitive/Language -Will D/C  Daytime Seroquel and remove mitten restraints given that he will need Rehab; Re-initate Night Seroquel  -Will hold  off PEG currently given MBS results as below and now he is tolerating at diet    Dysphagia, improving  -Etiology low please secondary to persistent encephalopathy, patient has pulled out core track [name], replaced today.   -Palliative care following, plan for family meeting on 12/9 and if no progression towards oral intake and family still wanting aggressive measures will need PEG tube placed if fails his Diet Trial. -Continue tube feeds via core track  -SLP to repeat MBS 12/13 and now on Dysphagia 1 Puree Solids with Honey Thick Liquids; Doing well and Eating well per nursing  -IR consulted for PEG tube placement but will hold of -Aspiration precautions -Will trial Calorie Count prior with Diet as above and TF and if fails will need PEG; Calorie Count ongoing and dietitian feels patient will do well so will stop Calorie Count and D/C Cortrak  -Tolerating his meals without issues   Lower GI Bleeding: Resolved -Noted by nurse, transient, no change in hemoglobin.  No further bleeding reported. -Continue intermittent monitoring of hemoglobin -Hgb/Hct went from 10.6/32.0 -> 10.7/32.7 -> 11.0/34.2 -> 11.6/34.7 -Continue to monitor for signs and symptoms of bleeding -Repeat CBC in a.m.   Acute Embolic CVA -MR brain 66/06 with many new acute infarcts throughout the bilateral frontal and parietal white matter consistent with embolism.   -TTE with LVEF 50-55%.  LDL 91, hemoglobin A1c 5.9.  -Holding antiplatelet for anemia, cerebellar hemorrhage.  -Neurology now signed off; and do not recommend further evaluation for cardiac source of embolism as he is not a good long-term anticoagulation candidate.   Hypertensive Emergency Hx Essential Hypertension -BP was severely elevated on admission, likely leading to acute cerebellar hemorrhage. -Continuing Amlodipine 5 mg per tube twice daily, Carvedilol 25 mg per tube twice daily, Hydralazine 100 mg per tube every 8 hours -Continue to Monitor BP per  Protocol  -Last BP reading was 121/72   Acute Respiratory Failure with Hypoxia: Resolved Aspiration pneumonia -Patient was initially intubated for airway protection upon ED arrival, patient self-extubated on 11/30.   -SpO2: 97 % O2 Flow Rate (L/min): 2 L/min FiO2 (%): 30 % -Completed course of antibiotics with cefepime followed by ceftriaxone.   Acute Metabolic Encephalopathy -Patient continues to not be able to follow commands, likely secondary to brain insult but could be a language barrier -SLP consulted for Congnitive and Language Eval  -C/w Supportive care, Seroquel has been adjusted given his somnolence and he just made nightly now   Type 2 Diabetes Mellitus -Hemoglobin A1c 5.9 08/25/2022, well-controlled. -C/w Semglee 10 units [place] daily -C/w Moderate SSI for coverage -CBGs qAC/HS; CBGs ranging from 99-188   ESRD on HD Elevated AG -Continue HD per nephrology and patient is going for Dialysis today  -Patient's BUN/Cr went from 160/10.17 -> 79/6.60 -> 135/8.91 -> 73/6.73 -> 38/4.96 -> 64/7.62 -Patient's AG is improved and CO2  is now 25, Chloride Level is 92, AG is 14 -Avoid further nephrotoxic medications, contrast dyes, hypotension and dehydration to ensure adequate renal perfusion and will and renally dose medications -Repeat CMP in the a.m.   Anemia of Chronic Medical/renal disease -Hemoglobin stable, continue intermittent monitoring.  -Transfuse hemoglobin < 7.0 -Patient's hemoglobin/hematocrit went from 10.6/32.0 -> 10.7/32.7 -> 11.0/34.2 -> 10.9/33.6 -> 11.6/34.7 -> 10.8/33.8 with an MCV of 70.0 -Repeat CBC in a.m.  Hyponatremia -Patient's sodium went from 131 -> 130 -> 129 x2 -> 133 -> 131 -Continue to Monitor and Trend and repeat CMP in the AM    Hypoalbuminemia -Patient's Albumin Level is now 3.4 x2 -> 3.6 -> 3.4 -Continue to Monitor and Trend -Repeat CMP in the AM    Clotted Fistula -Fistulogram showed poor outflow from fistula, nephrology consulted IR for  HD catheter placement which was performed on 12/6.   DVT prophylaxis: heparin injection 5,000 Units Start: 09/12/22 1445 Place and maintain sequential compression device Start: 08/25/22 1025    Code Status: Full Code Family Communication: Discussed with family at bedside  Disposition Plan:  Level of care: Telemetry Medical Status is: Inpatient Remains inpatient appropriate because: Needs further clinical improvement and needs to be out of restraints in order to go to SNF and rehab    Consultants:  Nephrology [facility] Neurosurgery Nephrology Interventional radiology Palliative care    Procedures:  HD catheter placement  Antimicrobials:  Anti-infectives (From admission, onward)    Start     Dose/Rate Route Frequency Ordered Stop   09/14/22 1145  ceFAZolin (ANCEF) IVPB 2g/100 mL premix        2 g 200 mL/hr over 30 Minutes Intravenous To Radiology 09/14/22 1123 09/15/22 0905   08/29/22 2000  cefTRIAXone (ROCEPHIN) 2 g in sodium chloride 0.9 % 100 mL IVPB        2 g 200 mL/hr over 30 Minutes Intravenous Every 24 hours 08/29/22 1009 09/01/22 2146   08/28/22 2000  ceFEPIme (MAXIPIME) 1 g in sodium chloride 0.9 % 100 mL IVPB  Status:  Discontinued        1 g 200 mL/hr over 30 Minutes Intravenous Every 24 hours 08/27/22 1009 08/27/22 1016   08/27/22 2000  ceFEPIme (MAXIPIME) 1 g in sodium chloride 0.9 % 100 mL IVPB  Status:  Discontinued        1 g 200 mL/hr over 30 Minutes Intravenous Every 24 hours 08/27/22 1016 08/29/22 1009   08/26/22 1000  ceFEPIme (MAXIPIME) 2 g in sodium chloride 0.9 % 100 mL IVPB  Status:  Discontinued        2 g 200 mL/hr over 30 Minutes Intravenous Every 12 hours 08/26/22 0850 08/27/22 1009       Subjective: Seen and examined at bedside and he is still little sleepy this morning but per nursing he is much more appropriate and interacting.  Patient is actually communicating with family and the nursing staff.  Appetite remains good.  Not as agitated.  When  asked patient denies any complaints currently.  Objective: Vitals:   09/26/22 0349 09/26/22 0655 09/26/22 0828 09/26/22 1207  BP: 119/73 124/86 129/69 121/72  Pulse: 75  82 75  Resp: '16  17 17  '$ Temp: 98.4 F (36.9 C)  98.2 F (36.8 C) (!) 97.4 F (36.3 C)  TempSrc: Oral  Oral Oral  SpO2: 98%  100% 97%  Weight:      Height:       No intake or output data in the 24 hours ending 09/26/22 1426 Filed Weights   09/23/22 1758 09/24/22 1440 09/24/22 1839  Weight: 73.5 kg 74.7 kg 72.1 kg   Examination: Physical Exam:  Constitutional: WN/WD overweight male in no acute distress appears a little sleepy but easily arousable Respiratory: Diminished to auscultation bilaterally, no wheezing, rales, rhonchi or crackles. Normal respiratory effort and patient is not tachypenic. No accessory muscle use.  Unlabored breathing Cardiovascular: RRR, no murmurs / rubs /  gallops. S1 and S2 auscultated.  Mild lower extremity edema Abdomen: Soft, non-tender, slightly distended. Bowel sounds positive.  GU: Deferred. Musculoskeletal: No clubbing / cyanosis of digits/nails. No joint deformity upper and lower extremities. Skin: No rashes, lesions, ulcers on limited skin evaluation. No induration; Warm and dry.  Neurologic: Little somnolent and drowsy but easily arousable.  Apparently more interactive with nursing Psychiatric: Has a normal mood and affect and not agitated  Data Reviewed: I have personally reviewed following labs and imaging studies  CBC: Recent Labs  Lab 09/21/22 1441 09/22/22 0343 09/23/22 0546 09/24/22 0958 09/25/22 0358 09/26/22 0415  WBC 12.6* 11.3* 10.5 9.7 10.6* 9.7  NEUTROABS 10.1*  --  8.2* 7.7 8.1* 6.3  HGB 10.6* 10.7* 11.0* 10.9* 11.6* 10.8*  HCT 32.0* 32.7* 34.2* 33.6* 34.7* 33.8*  MCV 68.5* 68.0* 68.5* 68.6* 68.8* 70.0*  PLT 349 324 319 290 301 503   Basic Metabolic Panel: Recent Labs  Lab 09/21/22 1500 09/22/22 0343 09/23/22 0546 09/24/22 0958 09/25/22 0358  09/26/22 0415  NA 131* 130* 129* 129* 133* 131*  K 4.6 3.9 4.4 3.7 3.9 3.7  CL 88* 91* 88* 90* 93* 92*  CO2 '24 25 24 26 27 25  '$ GLUCOSE 125* 108* 111* 188* 96 57*  BUN 160* 79* 135* 73* 38* 64*  CREATININE 10.17* 6.60* 8.91* 6.73* 4.96* 7.62*  CALCIUM 9.8 9.3 9.6 9.0 9.3 9.0  MG  --   --  2.4 2.0 1.9 2.0  PHOS 5.2*  --  4.9* 3.9 3.0 4.9*   GFR: Estimated Creatinine Clearance: 10 mL/min (A) (by C-G formula based on SCr of 7.62 mg/dL (H)). Liver Function Tests: Recent Labs  Lab 09/21/22 1500 09/23/22 0546 09/24/22 0958 09/25/22 0358 09/26/22 0415  AST  --  27 46* 52* 36  ALT  --  14 33 38 37  ALKPHOS  --  72 70 84 81  BILITOT  --  0.3 0.4 0.5 0.5  PROT  --  7.8 7.6 8.0 7.5  ALBUMIN 3.2* 3.4* 3.4* 3.6 3.4*   No results for input(s): "LIPASE", "AMYLASE" in the last 168 hours. No results for input(s): "AMMONIA" in the last 168 hours. Coagulation Profile: No results for input(s): "INR", "PROTIME" in the last 168 hours. Cardiac Enzymes: No results for input(s): "CKTOTAL", "CKMB", "CKMBINDEX", "TROPONINI" in the last 168 hours. BNP (last 3 results) No results for input(s): "PROBNP" in the last 8760 hours. HbA1C: No results for input(s): "HGBA1C" in the last 72 hours. CBG: Recent Labs  Lab 09/25/22 1142 09/25/22 1334 09/25/22 1724 09/26/22 0149 09/26/22 0705  GLUCAP 188* 156* 126* 127* 99   Lipid Profile: Recent Labs    09/26/22 0415  TRIG 66   Thyroid Function Tests: No results for input(s): "TSH", "T4TOTAL", "FREET4", "T3FREE", "THYROIDAB" in the last 72 hours. Anemia Panel: No results for input(s): "VITAMINB12", "FOLATE", "FERRITIN", "TIBC", "IRON", "RETICCTPCT" in the last 72 hours. Sepsis Labs: No results for input(s): "PROCALCITON", "LATICACIDVEN" in the last 168 hours.  No results found for this or any previous visit (from the past 240 hour(s)).   Radiology Studies: No results found.  Scheduled Meds:  amLODipine  5 mg Oral BID   carvedilol  25 mg  Oral BID WC   Chlorhexidine Gluconate Cloth  6 each Topical Q0600   Chlorhexidine Gluconate Cloth  6 each Topical Q0600   darbepoetin (ARANESP) injection - DIALYSIS  150 mcg Subcutaneous Q Mon-1800   feeding supplement (NEPRO CARB STEADY)  237 mL Oral TID WC  heparin injection (subcutaneous)  5,000 Units Subcutaneous Q8H   hydrALAZINE  100 mg Oral Q8H   insulin aspart  0-15 Units Subcutaneous Q6H   insulin glargine-yfgn  10 Units Subcutaneous Daily   multivitamin  1 tablet Oral QHS   mouth rinse  15 mL Mouth Rinse 4 times per day   pantoprazole (PROTONIX) IV  40 mg Intravenous Q12H   QUEtiapine  25 mg Oral QHS   sevelamer carbonate  2.4 g Oral TID WC   Continuous Infusions:  sodium chloride Stopped (08/30/22 0033)    LOS: 57 days   Raiford Noble, DO [facility] Available via Epic secure chat 7am-7pm After these hours, please refer to coverage provider listed on [email or web address] 09/26/2022, 2:26 PM

## 2022-09-26 NOTE — Progress Notes (Signed)
  [facility] Progress Note   Subjective:   seen in room, no c/o  Objective Vitals:   09/26/22 0655 09/26/22 0828 09/26/22 1207 09/26/22 1527  BP: 124/86 129/69 121/72 131/69  Pulse:  82 75 76  Resp:  '17 17 17  '$ Temp:  98.2 F (36.8 C) (!) 97.4 F (36.3 C) 98.4 F (36.9 C)  TempSrc:  Oral Oral Oral  SpO2:  100% 97% 97%  Weight:      Height:       Physical Exam General: awake, alert, wrote his name on a sheet of paper Heart: RRR, no murmurs, rubs or gallops Lungs: CTA anteriorly Abdomen: Soft, non-distended, +BS Extremities: no edema b/l lower extremties Neuro: as above Dialysis Access:  LUE AVF + bruit, R IJ Tunneled  HD catheter c/d/i    Dialysis Orders: SW MWF 3h 83mn  80kg   400/800   2/2 bath  Hep 2000  LFA AVF - last HD 11/13, post 80.1kg - mircera 150 q2, last 11/13 - hectorol 44m IV q HD  Assessment/Plan: 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 S/P course of hypertonic fluids.  AMS - due to #1. Has had sig improvement this week.  HTN/ vol  - getting po BP meds x 3, BP's good, no edema on exam, new lower dry wt ~72kg. 2.5 L off w/ HD yesterday. ESRD - pt required CRRT 11/15-11/17. Resumed MWF sched w/ HD yesterday. Next HD Monday.  HD access - BUN was very high so access recirc was suspected. IR did fistulogram 12/4 and AVF was found be small with competing veins. Using RIJ TDMadisonow placed by IR on 12/6.  Anemia of ESRD: Hgb 9s, continue Aranesp 15012mweekly while here. Secondary HPTH: CCa ok. Renvela 3 ac tid as binder. Continue VDRA.  DM2 - per pmd  RobKelly SplinterD 09/26/2022, 5:18 PM  Recent Labs  Lab 09/25/22 0358 09/26/22 0415  HGB 11.6* 10.8*  ALBUMIN 3.6 3.4*  CALCIUM 9.3 9.0  PHOS 3.0 4.9*  CREATININE 4.96* 7.62*  K 3.9 3.7     Inpatient medications:  amLODipine  5 mg Oral BID   carvedilol  25 mg Oral BID WC   Chlorhexidine Gluconate Cloth  6 each Topical Q0600   Chlorhexidine  Gluconate Cloth  6 each Topical Q0600   darbepoetin (ARANESP) injection - DIALYSIS  150 mcg Subcutaneous Q Mon-1800   feeding supplement (NEPRO CARB STEADY)  237 mL Oral TID WC   heparin injection (subcutaneous)  5,000 Units Subcutaneous Q8H   hydrALAZINE  100 mg Oral Q8H   insulin aspart  0-15 Units Subcutaneous Q6H   insulin glargine-yfgn  10 Units Subcutaneous Daily   multivitamin  1 tablet Oral QHS   mouth rinse  15 mL Mouth Rinse 4 times per day   pantoprazole (PROTONIX) IV  40 mg Intravenous Q12H   QUEtiapine  25 mg Oral QHS   sevelamer carbonate  2.4 g Oral TID WC    sodium chloride Stopped (08/30/22 0033)   sodium chloride, acetaminophen (TYLENOL) oral liquid 160 mg/5 mL, bisacodyl, fentaNYL (SUBLIMAZE) injection, hydrALAZINE, hydrOXYzine, labetalol, LORazepam, ondansetron (ZOFRAN) IV, mouth rinse

## 2022-09-27 LAB — CBC WITH DIFFERENTIAL/PLATELET
Abs Immature Granulocytes: 0.04 10*3/uL (ref 0.00–0.07)
Basophils Absolute: 0.1 10*3/uL (ref 0.0–0.1)
Basophils Relative: 1 %
Eosinophils Absolute: 0.3 10*3/uL (ref 0.0–0.5)
Eosinophils Relative: 3 %
HCT: 32.6 % — ABNORMAL LOW (ref 39.0–52.0)
Hemoglobin: 11 g/dL — ABNORMAL LOW (ref 13.0–17.0)
Immature Granulocytes: 0 %
Lymphocytes Relative: 14 %
Lymphs Abs: 1.4 10*3/uL (ref 0.7–4.0)
MCH: 23 pg — ABNORMAL LOW (ref 26.0–34.0)
MCHC: 33.7 g/dL (ref 30.0–36.0)
MCV: 68.1 fL — ABNORMAL LOW (ref 80.0–100.0)
Monocytes Absolute: 1.3 10*3/uL — ABNORMAL HIGH (ref 0.1–1.0)
Monocytes Relative: 13 %
Neutro Abs: 7.1 10*3/uL (ref 1.7–7.7)
Neutrophils Relative %: 69 %
Platelets: 314 10*3/uL (ref 150–400)
RBC: 4.79 MIL/uL (ref 4.22–5.81)
RDW: 19.6 % — ABNORMAL HIGH (ref 11.5–15.5)
WBC: 10.3 10*3/uL (ref 4.0–10.5)
nRBC: 0 % (ref 0.0–0.2)

## 2022-09-27 LAB — COMPREHENSIVE METABOLIC PANEL
ALT: 28 U/L (ref 0–44)
AST: 26 U/L (ref 15–41)
Albumin: 3.3 g/dL — ABNORMAL LOW (ref 3.5–5.0)
Alkaline Phosphatase: 73 U/L (ref 38–126)
Anion gap: 18 — ABNORMAL HIGH (ref 5–15)
BUN: 85 mg/dL — ABNORMAL HIGH (ref 6–20)
CO2: 22 mmol/L (ref 22–32)
Calcium: 8.9 mg/dL (ref 8.9–10.3)
Chloride: 90 mmol/L — ABNORMAL LOW (ref 98–111)
Creatinine, Ser: 9.65 mg/dL — ABNORMAL HIGH (ref 0.61–1.24)
GFR, Estimated: 6 mL/min — ABNORMAL LOW (ref >60)
Glucose, Bld: 83 mg/dL (ref 70–99)
Potassium: 4.4 mmol/L (ref 3.5–5.1)
Sodium: 130 mmol/L — ABNORMAL LOW (ref 135–145)
Total Bilirubin: 0.5 mg/dL (ref 0.3–1.2)
Total Protein: 7.2 g/dL (ref 6.5–8.1)

## 2022-09-27 LAB — GLUCOSE, CAPILLARY
Glucose-Capillary: 140 mg/dL — ABNORMAL HIGH (ref 70–99)
Glucose-Capillary: 163 mg/dL — ABNORMAL HIGH (ref 70–99)
Glucose-Capillary: 181 mg/dL — ABNORMAL HIGH (ref 70–99)
Glucose-Capillary: 90 mg/dL (ref 70–99)
Glucose-Capillary: 111 mg/dL — ABNORMAL HIGH (ref 70–99)
Glucose-Capillary: 258 mg/dL — ABNORMAL HIGH (ref 70–99)
Glucose-Capillary: 177 mg/dL — ABNORMAL HIGH (ref 70–99)

## 2022-09-27 LAB — PHOSPHORUS: Phosphorus: 5.2 mg/dL — ABNORMAL HIGH (ref 2.5–4.6)

## 2022-09-27 LAB — MAGNESIUM: Magnesium: 2 mg/dL (ref 1.7–2.4)

## 2022-09-27 MED ORDER — HEPARIN SODIUM (PORCINE) 1000 UNIT/ML IJ SOLN
INTRAMUSCULAR | Status: AC
  Administered 2022-09-27: 2000 [IU]
  Filled 2022-09-27: qty 2

## 2022-09-27 MED ORDER — HEPARIN SODIUM (PORCINE) 1000 UNIT/ML IJ SOLN
INTRAMUSCULAR | Status: AC
  Administered 2022-09-27: 3200 [IU]
  Filled 2022-09-27: qty 4

## 2022-09-27 MED ORDER — PANTOPRAZOLE SODIUM 40 MG PO TBEC
40.0000 mg | DELAYED_RELEASE_TABLET | Freq: Two times a day (BID) | ORAL | Status: DC
  Administered 2022-09-27 – 2022-10-14 (×34): 40 mg via ORAL
  Filled 2022-09-27 (×35): qty 1

## 2022-09-27 MED ORDER — HEPARIN SODIUM (PORCINE) 1000 UNIT/ML DIALYSIS
2000.0000 [IU] | INTRAMUSCULAR | Status: DC | PRN
  Administered 2022-09-27: 2000 [IU] via INTRAVENOUS_CENTRAL

## 2022-09-27 NOTE — Plan of Care (Signed)
  Problem: Activity: Goal: Ability to tolerate increased activity will improve Outcome: Progressing   Problem: Respiratory: Goal: Ability to maintain a clear airway and adequate ventilation will improve Outcome: Progressing   Problem: Role Relationship: Goal: Method of communication will improve Outcome: Progressing   Problem: Education: Goal: Ability to describe self-care measures that may prevent or decrease complications (Diabetes Survival Skills Education) will improve Outcome: Progressing Goal: Individualized Educational Video(s) Outcome: Progressing   Problem: Coping: Goal: Ability to adjust to condition or change in health will improve Outcome: Progressing   Problem: Fluid Volume: Goal: Ability to maintain a balanced intake and output will improve Outcome: Progressing   Problem: Health Behavior/Discharge Planning: Goal: Ability to identify and utilize available resources and services will improve Outcome: Progressing Goal: Ability to manage health-related needs will improve Outcome: Progressing   Problem: Metabolic: Goal: Ability to maintain appropriate glucose levels will improve Outcome: Progressing   Problem: Nutritional: Goal: Maintenance of adequate nutrition will improve Outcome: Progressing Goal: Progress toward achieving an optimal weight will improve Outcome: Progressing   Problem: Skin Integrity: Goal: Risk for impaired skin integrity will decrease Outcome: Progressing   Problem: Tissue Perfusion: Goal: Adequacy of tissue perfusion will improve Outcome: Progressing   Problem: Safety: Goal: Non-violent Restraint(s) Outcome: Progressing   Problem: Education: Goal: Knowledge of General Education information will improve Description: Including pain rating scale, medication(s)/side effects and non-pharmacologic comfort measures Outcome: Progressing   Problem: Health Behavior/Discharge Planning: Goal: Ability to manage health-related needs will  improve Outcome: Progressing   Problem: Clinical Measurements: Goal: Ability to maintain clinical measurements within normal limits will improve Outcome: Progressing Goal: Will remain free from infection Outcome: Progressing Goal: Diagnostic test results will improve Outcome: Progressing Goal: Respiratory complications will improve Outcome: Progressing Goal: Cardiovascular complication will be avoided Outcome: Progressing   Problem: Activity: Goal: Risk for activity intolerance will decrease Outcome: Progressing   Problem: Nutrition: Goal: Adequate nutrition will be maintained Outcome: Progressing   Problem: Coping: Goal: Level of anxiety will decrease Outcome: Progressing   Problem: Elimination: Goal: Will not experience complications related to bowel motility Outcome: Progressing Goal: Will not experience complications related to urinary retention Outcome: Progressing   Problem: Pain Managment: Goal: General experience of comfort will improve Outcome: Progressing   Problem: Safety: Goal: Ability to remain free from injury will improve Outcome: Progressing   Problem: Skin Integrity: Goal: Risk for impaired skin integrity will decrease Outcome: Progressing   Problem: Education: Goal: Knowledge of disease or condition will improve Outcome: Progressing Goal: Knowledge of secondary prevention will improve (MUST DOCUMENT ALL) Outcome: Progressing Goal: Knowledge of patient specific risk factors will improve Elta [place] N/A or DELETE if not current risk factor) Outcome: Progressing

## 2022-09-27 NOTE — Progress Notes (Signed)
PHARMACIST - PHYSICIAN COMMUNICATION  DR:   Alfredia Ferguson  CONCERNING: IV to Oral Route Change Policy  RECOMMENDATION: This patient is receiving pantoprazole by the intravenous route.  Based on criteria approved by the Pharmacy and Therapeutics Committee, the intravenous medication(s) is/are being converted to the equivalent oral dose form(s).   DESCRIPTION: These criteria include: The patient is eating (either orally or via tube) and/or has been taking other orally administered medications for a least 24 hours The patient has no evidence of active gastrointestinal bleeding or impaired GI absorption (gastrectomy, short bowel, patient on TNA or NPO).  If you have questions about this conversion, please contact the Pharmacy Department  '[]'$   [phone] )  [facility] '[]'$   [phone] )  [facility] '[x]'$   [phone] )  [facility] '[]'$   [phone] )  [facility] '[]'$   [phone] )  [place], PharmD, BCPS 09/27/2022 10:09 AM

## 2022-09-27 NOTE — Progress Notes (Signed)
Physical Therapy Treatment Patient Details Name: Randy Ross MRN: [id number] DOB: [birth year] Today's Date: 09/27/2022   History of Present Illness 51 y.o. male presented 08/24/22 with slurred speech and headache. Head CT Rt cerebellar hemorrhage; intubated 11/14, self-extubated 11/30; CRRT; MRI brain 11/21 many small acute infarcts; PMH significant for end-stage renal disease on intermittent hemodialysis, diabetes, hypertension    PT Comments    Patient awake, alert, and able to participate today! Patient mobilizing with +2 min assist (+2 for safety due to decr cognition), including walking 100 ft with RW. Patient following one step commands and feel he is appropriate for AIR consult. Discharge plan updated.     Recommendations for follow up therapy are one component of a multi-disciplinary discharge planning process, led by the attending physician.  Recommendations may be updated based on patient status, additional functional criteria and insurance authorization.  Follow Up Recommendations  Acute inpatient rehab (3hours/day) Can patient physically be transported by private vehicle: Yes   Assistance Recommended at Discharge Frequent or constant Supervision/Assistance  Patient can return home with the following Assistance with cooking/housework;Assistance with feeding;Direct supervision/assist for medications management;Direct supervision/assist for financial management;Assist for transportation;Help with stairs or ramp for entrance;A lot of help with walking and/or transfers;A lot of help with bathing/dressing/bathroom   Equipment Recommendations  None recommended by PT    Recommendations for Other Services Rehab consult     Precautions / Restrictions Precautions Precautions: Fall;Other (comment) Precaution Comments: pulled out multiple cortraks Restrictions Weight Bearing Restrictions: No     Mobility  Bed Mobility Overal bed mobility: Needs Assistance Bed Mobility:  Supine to Sit, Sit to Supine     Supine to sit: Min guard, HOB elevated Sit to supine: Min guard   General bed mobility comments: no physical assist needed; closeguarding for safety with pt waiting to be told when to stand up    Transfers Overall transfer level: Needs assistance Equipment used: 2 person hand held assist, Rolling walker (2 wheels) Transfers: Sit to/from Stand Sit to Stand: Min assist           General transfer comment: Min A to rise and steady; second trial with RW with pt able to support his balance with RW    Ambulation/Gait Ambulation/Gait assistance: Min assist, +2 safety/equipment Gait Distance (Feet): 100 Feet Assistive device: Rolling walker (2 wheels) Gait Pattern/deviations: Step-through pattern, Decreased stride length, Trunk flexed   Gait velocity interpretation: 1.31 - 2.62 ft/sec, indicative of limited community ambulator Pre-gait activities: marching in place with bil UE support General Gait Details: pt pushing RW too far ahead, but would make corrections when cued verbally; assist during turn for maneuvering RW and for balance   Stairs             Wheelchair Mobility    Modified Rankin (Stroke Patients Only) Modified Rankin (Stroke Patients Only) Pre-Morbid Rankin Score: No symptoms Modified Rankin: Moderately severe disability     Balance Overall balance assessment: Needs assistance Sitting-balance support: No upper extremity supported, Feet unsupported Sitting balance-Leahy Scale: Fair Sitting balance - Comments: Min guard A for safety   Standing balance support: Bilateral upper extremity supported, Single extremity supported, During functional activity Standing balance-Leahy Scale: Poor Standing balance comment: Min A for standing balance                            Cognition Arousal/Alertness: Awake/alert Behavior During Therapy: Flat affect Overall Cognitive Status: Impaired/Different from baseline Area of  Impairment:  Attention, Following commands, Awareness, Problem solving, Safety/judgement                   Current Attention Level: Sustained   Following Commands: Follows one step commands consistently, Follows one step commands with increased time Safety/Judgement: Decreased awareness of safety, Decreased awareness of deficits Awareness: Intellectual Problem Solving: Slow processing, Difficulty sequencing, Requires verbal cues General Comments: requires restraints due to trying to get OOB unassisted and pulls telemetry off; very few utterances and difficult to fully assess cognition        Exercises      General Comments        Pertinent Vitals/Pain Pain Assessment Pain Assessment: Faces Faces Pain Scale: No hurt    Home Living                          Prior Function            PT Goals (current goals can now be found in the care plan section) Acute Rehab PT Goals Patient Stated Goal: unable PT Goal Formulation: With patient Time For Goal Achievement: 10/11/22 Potential to Achieve Goals: Good Progress towards PT goals: Goals met and updated - see care plan    Frequency    Min 3X/week      PT Plan Discharge plan needs to be updated;Frequency needs to be updated    Co-evaluation              AM-PAC PT "6 Clicks" Mobility   Outcome Measure  Help needed turning from your back to your side while in a flat bed without using bedrails?: A Little Help needed moving from lying on your back to sitting on the side of a flat bed without using bedrails?: A Little Help needed moving to and from a bed to a chair (including a wheelchair)?: Total Help needed standing up from a chair using your arms (e.g., wheelchair or bedside chair)?: Total Help needed to walk in hospital room?: Total Help needed climbing 3-5 steps with a railing? : Total 6 Click Score: 10    End of Session Equipment Utilized During Treatment: Gait belt Activity Tolerance:  Patient tolerated treatment well Patient left: in bed;with bed alarm set;with restraints reapplied (bil wrist restraints) Nurse Communication: Mobility status (walked in hallway) PT Visit Diagnosis: Other abnormalities of gait and mobility (R26.89);Other symptoms and signs involving the nervous system (R29.898)     Time: 2575-0518 PT Time Calculation (min) (ACUTE ONLY): 16 min  Charges:  $Gait Training: 8-22 mins                      [place]  Office [phone]    [name] 09/27/2022, 2:48 PM

## 2022-09-27 NOTE — Progress Notes (Signed)
Received patient in bed before the start of the treatment.Alert to self,not in pain.Vitals stable,noted patient is with bilateral soft wrist restraint-order ondate.  Access used: Right HD catheter that works well during his treatment:  Duration of treatment : 3.5 hours   Medicine given: Heparin 2,000 at the start of treatment and another 2,000 units during mid treatment.  Bolus given : None   Fluid removed: 1.5 liters  Hemodialysis tx issue :None.  Hands off to the patient's nurse.

## 2022-09-27 NOTE — Procedures (Signed)
I was present at this dialysis session. I have reviewed the session itself and made appropriate changes.   Vital signs in last 24 hours:  Temp:  [97.4 F (36.3 C)-98.4 F (36.9 C)] 98.2 F (36.8 C) (12/18 0715) Pulse Rate:  [72-81] 80 (12/18 0804) Resp:  [17-22] 19 (12/18 0804) BP: (119-165)/(67-99) 134/77 (12/18 0804) SpO2:  [97 %-99 %] 99 % (12/18 0804) Weight:  [76.4 kg] 76.4 kg (12/18 0715) Weight change:  Filed Weights   09/24/22 1440 09/24/22 1839 09/27/22 0715  Weight: 74.7 kg 72.1 kg 76.4 kg    Recent Labs  Lab 09/27/22 0403  NA 130*  K 4.4  CL 90*  CO2 22  GLUCOSE 83  BUN 85*  CREATININE 9.65*  CALCIUM 8.9  PHOS 5.2*    Recent Labs  Lab 09/25/22 0358 09/26/22 0415 09/27/22 0403  WBC 10.6* 9.7 10.3  NEUTROABS 8.1* 6.3 7.1  HGB 11.6* 10.8* 11.0*  HCT 34.7* 33.8* 32.6*  MCV 68.8* 70.0* 68.1*  PLT 301 319 314    Scheduled Meds:  amLODipine  5 mg Oral BID   carvedilol  25 mg Oral BID WC   Chlorhexidine Gluconate Cloth  6 each Topical Q0600   Chlorhexidine Gluconate Cloth  6 each Topical Q0600   Chlorhexidine Gluconate Cloth  6 each Topical Q0600   darbepoetin (ARANESP) injection - DIALYSIS  150 mcg Subcutaneous Q Mon-1800   feeding supplement (NEPRO CARB STEADY)  237 mL Oral TID WC   heparin injection (subcutaneous)  5,000 Units Subcutaneous Q8H   hydrALAZINE  100 mg Oral Q8H   insulin aspart  0-15 Units Subcutaneous Q6H   insulin glargine-yfgn  10 Units Subcutaneous Daily   multivitamin  1 tablet Oral QHS   mouth rinse  15 mL Mouth Rinse 4 times per day   pantoprazole (PROTONIX) IV  40 mg Intravenous Q12H   QUEtiapine  25 mg Oral QHS   sevelamer carbonate  2.4 g Oral TID WC   Continuous Infusions:  sodium chloride Stopped (08/30/22 0033)   PRN Meds:.sodium chloride, acetaminophen (TYLENOL) oral liquid 160 mg/5 mL, bisacodyl, fentaNYL (SUBLIMAZE) injection, [START ON 09/28/2022] heparin, hydrALAZINE, hydrOXYzine, labetalol, LORazepam, ondansetron  (ZOFRAN) IV, mouth rinse   Donetta Potts,  MD 09/27/2022, 8:30 AM

## 2022-09-27 NOTE — Progress Notes (Signed)
Speech Language Pathology Treatment: Dysphagia  Patient Details Name: Randy Ross MRN: [id number] DOB: [birth year] Today's Date: 09/27/2022 Time: 1359-1410 SLP Time Calculation (min) (ACUTE ONLY): 11 min  Assessment / Plan / Recommendation Clinical Impression  Pt now fully alert able to self feed, no longer getting sedating medications. Pt taking consecutive straw sips of nectar thick liquids and thin liquid trials without coughing. On MBS aspiration was sensed and mentation altered. Pt also eager to try regular solids which he self fed with slightly effortful mastication but good oral clearance of no coughing. He has started refusing purees, so will upgrade to dys 3/thin for observation while still admitted for safety and tolerance.   HPI HPI: Pt is a 51 y.o. male who presented with sudden onset slurred speech, chest pain, left sided weakness. Surrounding edema in the face of the fourth ventricle without hydrocephalus. 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 ETT 11/14-11/30 (self-extubated). Palliative meeting 12/1: full scope. PMH: end-stage renal disease on intermittent hemodialysis, diabetes, hypertension.      SLP Plan  Continue with current plan of care      Recommendations for follow up therapy are one component of a multi-disciplinary discharge planning process, led by the attending physician.  Recommendations may be updated based on patient status, additional functional criteria and insurance authorization.    Recommendations  Diet recommendations: Dysphagia 3 (mechanical soft);Thin liquid Liquids provided via: Cup;Straw Medication Administration: Crushed with puree Supervision: Staff to assist with self feeding Compensations: Slow rate;Small sips/bites;Minimize environmental distractions Postural Changes and/or Swallow Maneuvers: Seated upright 90 degrees                Follow Up Recommendations: Skilled nursing-short  term rehab (<3 hours/day) Assistance recommended at discharge: Frequent or constant Supervision/Assistance SLP Visit Diagnosis: Cognitive communication deficit (R41.841);Dysarthria and anarthria (R47.1) Plan: Continue with current plan of care           Randy Ross, Katherene Ponto  09/27/2022, 2:32 PM

## 2022-09-27 NOTE — Progress Notes (Signed)
Inpatient Rehab Admissions Coordinator:   Per OT recommendation,  patient was screened for CIR candidacy by Clemens Catholic, MS, CCC-SLP. She appears to be an appropriate AIR candidate; however, Pt.'s insurance is out of network with CIR so TOC will need to look for other AIRs that accept her insurance.    Clemens Catholic, [place], [place] Admissions Coordinator  [phone] ([place]) [phone] (office)

## 2022-09-27 NOTE — Plan of Care (Signed)
  Problem: Activity: Goal: Ability to tolerate increased activity will improve Outcome: Progressing   Problem: Safety: Goal: Non-violent Restraint(s) Outcome: Progressing   Problem: Clinical Measurements: Goal: Will remain free from infection Outcome: Progressing Goal: Respiratory complications will improve Outcome: Progressing

## 2022-09-27 NOTE — Progress Notes (Signed)
PROGRESS NOTE    Randy Ross  HUT:[id number] DOB: [birth year] DOA: 08/24/2022 PCP: Medicine, [facility]   Brief Narrative:  Randy Ross is a 51 y.o. male with past medical history significant for ESRD on HD, DM2, essential hypertension who presented to [facility] ED with sudden onset slurred speech, headache.  Workup in the ER with elevated BP of 260/140 and CT head showing cerebellar hemorrhage.  Patient was initially intubated in the ED for airway protection and admitted to the PCCM/neurology service.   Significant Hospital events: 11/14: Admitted for acute cerebellar hemorrhage; intubated in the ER, started on hypertonic saline 11/15: Neurosurgery were consulted, recommended against surgical evacuation 11/16: Cefepime started for suspected aspiration 11/17: Required 1u PRBC transfusion due to CRRT filter 11/19: EEG nonspecific 11/21: MRI brain shows new scattered bilateral acute infarcts 11/22: Completed 7 days antibiotics 11/24: IR consulted given recirculation syndrome, high BUN; plan for fistulogram pending 11/27: Temp cath HD cath placed  11/30: Self-extubated 12/1: Persistent delirium 12/2: Transferred out of ICU to [facility] service 12/3: Pulled out cortrak 12/4: Failed SLP, needs cortrak, fistulogram shows poor outflow 12/5: TDC placed, feeding tube replaced 12/6: HD catheter placed by IR 12/9: Family meeting with palliative care, family to decide on possible transitioning to comfort measures  12/11: Family decided to continue aggressive measures, IR consulted for PEG tube 12/12: SLP requesting MBS prior to PEG tube placement; MBS likely plan for 12/12 12/13: MBS done and recommending dysphagia 1 diet with honey thick liquids.  Will see how the patient tolerates p.o. intake and obtain a calorie count prior to initiation of a PEG.  Will continue the Nepro at 50 MLS per hour and Prosource tube feedings once a day as well as considering adjusting if he has  good p.o. intake 12/15: Patient doing fairly well with his calorie count so we will remove his pain to feeding tube and the dietitian adjusted his oral supplements.  He is a little agitated still so required soft mitten restraints.  Allegedly rolled out of the bed but had no acute injuries as a nurse over there.   12/16 he received Seroquel and was not really interactive during the encounter so we will discontinue.  Will not renew soft mitten restraints as we will try and get him to SNF and he needs to be monitored carefully.  Apparently from nursing that he is talking more.  He is back on his Monday Wednesday Friday schedule for hemodialysis and continues to have improvement in his mental status slowly over the week  Assessment and Plan: Acute Cerebellar hemorrhage 2/2 poorly controlled hypertension Cerebral edema -Patient presenting to ED via EMS with mental status change, left-sided weakness, slurred speech and left-sided gaze preference.   -Patient was noted to be severely hypertensive on admission.   -Patient was subsequently intubated for airway protection and CT notable for IPH right cerebellar hemisphere with mass effect.   -Patient was started on Cleviprex, Versed and admitted under the critical care/neurology service.  Patient was seen by neurosurgery who did not recommend surgical intervention; and patient was started on hypertonic saline and placed on CRRT to minimize rapid fluid shifts. -Mental Status is improving , repeat CT head x 3 remained stable overall very poor prognosis and palliative care following.   -Neurology now signed off.   -WBC went from 12.6 -> 11.3 -> 10.5 -> 9.7 -> 10.6 -> 9.7 -> 10.3  -Was on CRRT converted back to HD per nephrology and is improving.  Likely will  pursue SNF placement given that he is improving -SLP evaluating for Congitive/Language -Will D/C  Daytime Seroquel and remove mitten restraints given that he will need Rehab; Re-initated Night Seroquel   -Will need  -Will hold off PEG currently given MBS results as below and now he is tolerating at diet    Dysphagia, improving  -Etiology low please secondary to persistent encephalopathy, patient has pulled out core track [name], replaced today.   -Palliative care following, plan for family meeting on 12/9 and if no progression towards oral intake and family still wanting aggressive measures will need PEG tube placed if fails his Diet Trial. -Continue tube feeds via core track  -SLP to repeat MBS 12/13 and now on Dysphagia 1 Puree Solids with Honey Thick Liquids; Doing well and Eating well per nursing  -IR consulted for PEG tube placement but will hold of -Aspiration precautions -Will trial Calorie Count prior with Diet as above and TF and if fails will need PEG; Calorie Count ongoing and dietitian feels patient will do well so will stop Calorie Count and D/C Cortrak  -Tolerating his meals without issues   Lower GI Bleeding: Resolved -Noted by nurse, transient, no change in hemoglobin.  No further bleeding reported. -Continue intermittent monitoring of hemoglobin -Hgb/Hct went from 10.6/32.0 -> 10.7/32.7 -> 11.0/34.2 -> 11.6/34.7 -> 9/6.65 -Continue to monitor for signs and symptoms of bleeding -Repeat CBC in a.m.   Acute Embolic CVA -MR brain 27/06 with many new acute infarcts throughout the bilateral frontal and parietal white matter consistent with embolism.   -TTE with LVEF 50-55%.  LDL 91, hemoglobin A1c 5.9.  -Holding antiplatelet for anemia, cerebellar hemorrhage.  -Neurology now signed off; and do not recommend further evaluation for cardiac source of embolism as he is not a good long-term anticoagulation candidate.   Hypertensive Emergency Hx Essential Hypertension -BP was severely elevated on admission, likely leading to acute cerebellar hemorrhage. -Continuing Amlodipine 5 mg per tube twice daily, Carvedilol 25 mg per tube twice daily, Hydralazine 100 mg per tube every 8  hours -Continue to Monitor BP per Protocol  -Last BP reading was 100/60   Acute Respiratory Failure with Hypoxia: Resolved Aspiration Pneumonia -Patient was initially intubated for airway protection upon ED arrival, patient self-extubated on 11/30.   -SpO2: 98 % O2 Flow Rate (L/min): 2 L/min FiO2 (%): 30 % -Completed course of antibiotics with cefepime followed by ceftriaxone.   Acute Metabolic Encephalopathy, improving -Patient continues to not be able to follow commands, likely secondary to brain insult but he is much more appropriate but still intermittently agitated and trying to pull out his tunneled HD catheter -SLP consulted for Congnitive and Language Eval  -C/w Supportive care, Seroquel has been adjusted given his somnolence and he just made nightly now   Type 2 Diabetes Mellitus -Hemoglobin A1c 5.9 08/25/2022, well-controlled. -C/w Semglee 10 units [facility] [place] daily -C/w Moderate SSI for coverage -CBGs qAC/HS; CBGs ranging from 90-258   ESRD on HD Elevated AG Hyperphosphatemia  -Continue HD per nephrology and patient is going for Dialysis today  --BUN/Cr Trend: Recent Labs  Lab 09/21/22 1500 09/22/22 0343 09/23/22 0546 09/24/22 0958 09/25/22 0358 09/26/22 0415 09/27/22 0403  BUN 160* 79* 135* 73* 38* 64* 85*  CREATININE 10.17* 6.60* 8.91* 6.73* 4.96* 7.62* 9.65*  -Avoid Nephrotoxic Medications, Contrast Dyes, Hypotension and Dehydration to Ensure Adequate Renal Perfusion and will need to Renally Adjust Meds -Patient's CO2 is now 22, anion gap is 18, chloride level is 90 -Continue to Monitor  and Trend Renal Function carefully and repeat CMP in the AM    Anemia of Chronic Medical/renal disease -Hemoglobin stable, continue intermittent monitoring.  -Transfuse hemoglobin < 7.0 -Patient's hemoglobin/hematocrit went from 10.6/32.0 -> 10.7/32.7 -> 11.0/34.2 -> 10.9/33.6 -> 11.6/34.7 -> 10.8/33.8 -> 11.0/32.6 with an MCV of 68.1 -Repeat CBC in a.m.   Hyponatremia -Patient's  sodium went from 131 -> 130 -> 129 x2 -> 133 -> 131 -> 130 -Continue to Monitor and Trend and repeat CMP in the AM    Hypoalbuminemia -Patient's Albumin Level is now 3.4 x2 -> 3.6 -> 3.4 -> 3.3 -Continue to Monitor and Trend -Repeat CMP in the AM    Clotted Fistula -Fistulogram showed poor outflow from fistula, nephrology consulted IR for HD catheter placement which was performed on 12/6.   DVT prophylaxis: heparin injection 5,000 Units Start: 09/12/22 1445 Place and maintain sequential compression device Start: 08/25/22 1025    Code Status: Full Code Family Communication: No family currently bedside as patient was seen in the dialysis unit  Disposition Plan:  Level of care: Telemetry Medical Status is: Inpatient Remains inpatient appropriate because: Needs further clinical improvement now that he is eating more will need to be out of restraints and have his mentation improved.   Consultants:  Nephrology PCCM Neurosurgery Nephrology Interventional radiology Palliative care  Procedures:  HD Catheter placement   Antimicrobials:  Anti-infectives (From admission, onward)    Start     Dose/Rate Route Frequency Ordered Stop   09/14/22 1145  ceFAZolin (ANCEF) IVPB 2g/100 mL premix        2 g 200 mL/hr over 30 Minutes Intravenous To Radiology 09/14/22 1123 09/15/22 0905   08/29/22 2000  cefTRIAXone (ROCEPHIN) 2 g in sodium chloride 0.9 % 100 mL IVPB        2 g 200 mL/hr over 30 Minutes Intravenous Every 24 hours 08/29/22 1009 09/01/22 2146   08/28/22 2000  ceFEPIme (MAXIPIME) 1 g in sodium chloride 0.9 % 100 mL IVPB  Status:  Discontinued        1 g 200 mL/hr over 30 Minutes Intravenous Every 24 hours 08/27/22 1009 08/27/22 1016   08/27/22 2000  ceFEPIme (MAXIPIME) 1 g in sodium chloride 0.9 % 100 mL IVPB  Status:  Discontinued        1 g 200 mL/hr over 30 Minutes Intravenous Every 24 hours 08/27/22 1016 08/29/22 1009   08/26/22 1000  ceFEPIme (MAXIPIME) 2 g in sodium  chloride 0.9 % 100 mL IVPB  Status:  Discontinued        2 g 200 mL/hr over 30 Minutes Intravenous Every 12 hours 08/26/22 0850 08/27/22 1009       Subjective: Seen and examined at bedside and he was seen in the dialysis unit was much more awake and alert and was interacting with me and denied complaints of pain.  Tolerating his diet fairly well.  Communicating with me for the first time this entire week and answering questions appropriate however nursing states that he still intermittently agitated and confused and trying to pull out his HD catheter.  No other concerns or complaints at this time.  Objective: Vitals:   09/27/22 1134 09/27/22 1209 09/27/22 1340 09/27/22 1539  BP: (!) 141/63  (!) 141/76 100/60  Pulse: 80  79 85  Resp: '19  16 18  '$ Temp: 98.1 F (36.7 C)  97.8 F (36.6 C) 98.2 F (36.8 C)  TempSrc: Axillary  Oral   SpO2: 99%  97% 98%  Weight:  74.9 kg    Height:        Intake/Output Summary (Last 24 hours) at 09/27/2022 1857 Last data filed at 09/27/2022 1134 Gross per 24 hour  Intake 100 ml  Output 1501 ml  Net -1401 ml   Filed Weights   09/24/22 1839 09/27/22 0715 09/27/22 1209  Weight: 72.1 kg 76.4 kg 74.9 kg   Examination: Physical Exam:  Constitutional: WN/WD overweight male in no acute distress sitting up in the dialysis unit answering questions appropriately Respiratory: Diminished to auscultation bilaterally with coarse breath sounds, no wheezing, rales, rhonchi or crackles. Normal respiratory effort and patient is not tachypenic. No accessory muscle use.  Unlabored breathing Cardiovascular: RRR, no murmurs / rubs / gallops. S1 and S2 auscultated. No extremity edema. Abdomen: Soft, non-tender, non-distended. Bowel sounds positive.  GU: Deferred. Musculoskeletal: No clubbing / cyanosis of digits/nails. No joint deformity upper and lower extremities. Skin: No rashes, lesions, ulcers on limited skin evaluation. No induration; Warm and dry.  Neurologic:  CN 2-12 grossly intact with no focal deficits. Romberg sign cerebellar reflexes not assessed.  Psychiatric: Impaired judgment and insight. Alert and oriented x 2. Normal mood and appropriate affect and he is not agitated.   Data Reviewed: I have personally reviewed following labs and imaging studies  CBC: Recent Labs  Lab 09/23/22 0546 09/24/22 0958 09/25/22 0358 09/26/22 0415 09/27/22 0403  WBC 10.5 9.7 10.6* 9.7 10.3  NEUTROABS 8.2* 7.7 8.1* 6.3 7.1  HGB 11.0* 10.9* 11.6* 10.8* 11.0*  HCT 34.2* 33.6* 34.7* 33.8* 32.6*  MCV 68.5* 68.6* 68.8* 70.0* 68.1*  PLT 319 290 301 319 983   Basic Metabolic Panel: Recent Labs  Lab 09/23/22 0546 09/24/22 0958 09/25/22 0358 09/26/22 0415 09/27/22 0403  NA 129* 129* 133* 131* 130*  K 4.4 3.7 3.9 3.7 4.4  CL 88* 90* 93* 92* 90*  CO2 '24 26 27 25 22  '$ GLUCOSE 111* 188* 96 57* 83  BUN 135* 73* 38* 64* 85*  CREATININE 8.91* 6.73* 4.96* 7.62* 9.65*  CALCIUM 9.6 9.0 9.3 9.0 8.9  MG 2.4 2.0 1.9 2.0 2.0  PHOS 4.9* 3.9 3.0 4.9* 5.2*   GFR: Estimated Creatinine Clearance: 8.6 mL/min (A) (by C-G formula based on SCr of 9.65 mg/dL (H)). Liver Function Tests: Recent Labs  Lab 09/23/22 0546 09/24/22 0958 09/25/22 0358 09/26/22 0415 09/27/22 0403  AST 27 46* 52* 36 26  ALT 14 33 38 37 28  ALKPHOS 72 70 84 81 73  BILITOT 0.3 0.4 0.5 0.5 0.5  PROT 7.8 7.6 8.0 7.5 7.2  ALBUMIN 3.4* 3.4* 3.6 3.4* 3.3*   No results for input(s): "LIPASE", "AMYLASE" in the last 168 hours. No results for input(s): "AMMONIA" in the last 168 hours. Coagulation Profile: No results for input(s): "INR", "PROTIME" in the last 168 hours. Cardiac Enzymes: No results for input(s): "CKTOTAL", "CKMB", "CKMBINDEX", "TROPONINI" in the last 168 hours. BNP (last 3 results) No results for input(s): "PROBNP" in the last 8760 hours. HbA1C: No results for input(s): "HGBA1C" in the last 72 hours. CBG: Recent Labs  Lab 09/26/22 2010 09/26/22 2354 09/27/22 0428  09/27/22 1335 09/27/22 1544  GLUCAP 161* 129* 90 111* 258*   Lipid Profile: Recent Labs    09/26/22 0415  TRIG 66   Thyroid Function Tests: No results for input(s): "TSH", "T4TOTAL", "FREET4", "T3FREE", "THYROIDAB" in the last 72 hours. Anemia Panel: No results for input(s): "VITAMINB12", "FOLATE", "FERRITIN", "TIBC", "IRON", "RETICCTPCT" in the last 72 hours. Sepsis Labs: No results  for input(s): "PROCALCITON", "LATICACIDVEN" in the last 168 hours.  No results found for this or any previous visit (from the past 240 hour(s)).   Radiology Studies: No results found.  Scheduled Meds:  amLODipine  5 mg Oral BID   carvedilol  25 mg Oral BID WC   Chlorhexidine Gluconate Cloth  6 each Topical Q0600   Chlorhexidine Gluconate Cloth  6 each Topical Q0600   Chlorhexidine Gluconate Cloth  6 each Topical Q0600   darbepoetin (ARANESP) injection - DIALYSIS  150 mcg Subcutaneous Q Mon-1800   feeding supplement (NEPRO CARB STEADY)  237 mL Oral TID WC   heparin injection (subcutaneous)  5,000 Units Subcutaneous Q8H   hydrALAZINE  100 mg Oral Q8H   insulin aspart  0-15 Units Subcutaneous Q6H   insulin glargine-yfgn  10 Units Subcutaneous Daily   multivitamin  1 tablet Oral QHS   mouth rinse  15 mL Mouth Rinse 4 times per day   pantoprazole  40 mg Oral BID   QUEtiapine  25 mg Oral QHS   sevelamer carbonate  2.4 g Oral TID WC   Continuous Infusions:  sodium chloride Stopped (08/30/22 0033)    LOS: 34 days   Raiford Noble, DO [facility] Available via Epic secure chat 7am-7pm After these hours, please refer to coverage provider listed on [email or web address] 09/27/2022, 6:57 PM

## 2022-09-28 DIAGNOSIS — I614 Nontraumatic intracerebral hemorrhage in cerebellum: Secondary | ICD-10-CM | POA: Diagnosis not present

## 2022-09-28 DIAGNOSIS — G9341 Metabolic encephalopathy: Secondary | ICD-10-CM

## 2022-09-28 DIAGNOSIS — I639 Cerebral infarction, unspecified: Secondary | ICD-10-CM

## 2022-09-28 DIAGNOSIS — Z794 Long term (current) use of insulin: Secondary | ICD-10-CM

## 2022-09-28 DIAGNOSIS — I161 Hypertensive emergency: Secondary | ICD-10-CM

## 2022-09-28 DIAGNOSIS — G936 Cerebral edema: Secondary | ICD-10-CM

## 2022-09-28 DIAGNOSIS — J9601 Acute respiratory failure with hypoxia: Secondary | ICD-10-CM | POA: Diagnosis not present

## 2022-09-28 DIAGNOSIS — E1122 Type 2 diabetes mellitus with diabetic chronic kidney disease: Secondary | ICD-10-CM

## 2022-09-28 LAB — CBC WITH DIFFERENTIAL/PLATELET
Abs Immature Granulocytes: 0.03 10*3/uL (ref 0.00–0.07)
Basophils Absolute: 0.1 10*3/uL (ref 0.0–0.1)
Basophils Relative: 1 %
Eosinophils Absolute: 0.2 10*3/uL (ref 0.0–0.5)
Eosinophils Relative: 3 %
HCT: 34.1 % — ABNORMAL LOW (ref 39.0–52.0)
Hemoglobin: 10.8 g/dL — ABNORMAL LOW (ref 13.0–17.0)
Immature Granulocytes: 0 %
Lymphocytes Relative: 14 %
Lymphs Abs: 1.2 10*3/uL (ref 0.7–4.0)
MCH: 22.4 pg — ABNORMAL LOW (ref 26.0–34.0)
MCHC: 31.7 g/dL (ref 30.0–36.0)
MCV: 70.7 fL — ABNORMAL LOW (ref 80.0–100.0)
Monocytes Absolute: 1.2 10*3/uL — ABNORMAL HIGH (ref 0.1–1.0)
Monocytes Relative: 13 %
Neutro Abs: 6.1 10*3/uL (ref 1.7–7.7)
Neutrophils Relative %: 69 %
Platelets: 330 10*3/uL (ref 150–400)
RBC: 4.82 MIL/uL (ref 4.22–5.81)
RDW: 19.4 % — ABNORMAL HIGH (ref 11.5–15.5)
WBC: 8.7 10*3/uL (ref 4.0–10.5)
nRBC: 0 % (ref 0.0–0.2)

## 2022-09-28 LAB — COMPREHENSIVE METABOLIC PANEL
ALT: 25 U/L (ref 0–44)
AST: 23 U/L (ref 15–41)
Albumin: 3.3 g/dL — ABNORMAL LOW (ref 3.5–5.0)
Alkaline Phosphatase: 78 U/L (ref 38–126)
Anion gap: 14 (ref 5–15)
BUN: 45 mg/dL — ABNORMAL HIGH (ref 6–20)
CO2: 25 mmol/L (ref 22–32)
Calcium: 9 mg/dL (ref 8.9–10.3)
Chloride: 94 mmol/L — ABNORMAL LOW (ref 98–111)
Creatinine, Ser: 6.63 mg/dL — ABNORMAL HIGH (ref 0.61–1.24)
GFR, Estimated: 9 mL/min — ABNORMAL LOW (ref >60)
Glucose, Bld: 87 mg/dL (ref 70–99)
Potassium: 4.4 mmol/L (ref 3.5–5.1)
Sodium: 133 mmol/L — ABNORMAL LOW (ref 135–145)
Total Bilirubin: 0.3 mg/dL (ref 0.3–1.2)
Total Protein: 7.4 g/dL (ref 6.5–8.1)

## 2022-09-28 LAB — GLUCOSE, CAPILLARY
Glucose-Capillary: 114 mg/dL — ABNORMAL HIGH (ref 70–99)
Glucose-Capillary: 132 mg/dL — ABNORMAL HIGH (ref 70–99)
Glucose-Capillary: 169 mg/dL — ABNORMAL HIGH (ref 70–99)
Glucose-Capillary: 176 mg/dL — ABNORMAL HIGH (ref 70–99)
Glucose-Capillary: 177 mg/dL — ABNORMAL HIGH (ref 70–99)

## 2022-09-28 LAB — MAGNESIUM: Magnesium: 2 mg/dL (ref 1.7–2.4)

## 2022-09-28 LAB — PHOSPHORUS: Phosphorus: 4.1 mg/dL (ref 2.5–4.6)

## 2022-09-28 NOTE — Progress Notes (Signed)
Occupational Therapy Treatment Patient Details Name: Randy Ross MRN: [id number] DOB: 01-Jun-1971 Today's Date: 09/28/2022   History of present illness 51 y.o. male presented 08/24/22 with slurred speech and headache. Head CT Rt cerebellar hemorrhage; intubated 11/14, self-extubated 11/30; CRRT; MRI brain 11/21 many small acute infarcts; PMH significant for end-stage renal disease on intermittent hemodialysis, diabetes, hypertension   OT comments  Pt making steady progress towards OT goals this session. Pt continues to present with L inattention, decreased safety awareness and impaired balance . Session focus on  BADL reeducation and functional ambulation with Rw. Pt currently requires Min guard assist for standing ADLs at sink with unilateral support, pt did initiate washing BUEs with no cues but needed assist to locate soap on L side. Pt requires MIN A for LB dressing to pull pants up to waist line on L side and MIN A for functional ambulation greater than a household distance with Rw as pt needs assist manage Rw and avoid obstacles on L side. Pt would continue to benefit from skilled occupational therapy while admitted and after d/c to address the below listed limitations in order to improve overall functional mobility and facilitate independence with BADL participation. DC plan remains appropriate, will follow acutely per POC.      Recommendations for follow up therapy are one component of a multi-disciplinary discharge planning process, led by the attending physician.  Recommendations may be updated based on patient status, additional functional criteria and insurance authorization.    Follow Up Recommendations  Acute inpatient rehab (3hours/day)     Assistance Recommended at Discharge Frequent or constant Supervision/Assistance  Patient can return home with the following  A little help with walking and/or transfers;A little help with bathing/dressing/bathroom;Assistance with  feeding;Direct supervision/assist for medications management;Direct supervision/assist for financial management;Assist for transportation;Help with stairs or ramp for entrance   Equipment Recommendations  Other (comment) (defer to next venue of care)    Recommendations for Other Services      Precautions / Restrictions Precautions Precautions: Fall;Other (comment) Precaution Comments: pulled out multiple cortraks, bilateral restraints Restrictions Weight Bearing Restrictions: No       Mobility Bed Mobility Overal bed mobility: Needs Assistance Bed Mobility: Supine to Sit, Sit to Supine     Supine to sit: Min guard, HOB elevated Sit to supine: Min guard   General bed mobility comments: min guard for safety    Transfers Overall transfer level: Needs assistance Equipment used: Rolling walker (2 wheels) Transfers: Sit to/from Stand Sit to Stand: Min assist           General transfer comment: MIN A to rise and steady from EOB with cues for hand placement     Balance Overall balance assessment: Needs assistance Sitting-balance support: No upper extremity supported, Feet unsupported Sitting balance-Leahy Scale: Fair     Standing balance support: Single extremity supported, During functional activity Standing balance-Leahy Scale: Poor Standing balance comment: reliant on at least one UE support                           ADL either performed or assessed with clinical judgement   ADL Overall ADL's : Needs assistance/impaired     Grooming: Wash/dry hands;Standing;Min guard Grooming Details (indicate cue type and reason): min guard for balance with unilateral support on sink             Lower Body Dressing: Minimal assistance;Sit to/from stand Lower Body Dressing Details (indicate cue type and reason):  to pull up pants in standing needing assist to pull pants up on L side Toilet Transfer: Minimal assistance;Ambulation;Cueing for safety;Cueing for  sequencing;Rolling walker (2 wheels) Toilet Transfer Details (indicate cue type and reason): simualted via functional mobility with RW, assist needed to manage Rw and to avoid obstacles on L side         Functional mobility during ADLs: Minimal assistance;Cueing for safety;Cueing for sequencing;Rolling walker (2 wheels) General ADL Comments: ADL participation impacted by L inattention and  impaired balance    Extremity/Trunk Assessment Upper Extremity Assessment Upper Extremity Assessment: Generalized weakness (pt using BUEs equally during ADLs, unable to fully assess ROM d/t cog)   Lower Extremity Assessment Lower Extremity Assessment: Defer to PT evaluation        Vision Baseline Vision/History: 1 Wears glasses Ability to See in Adequate Light: 0 Adequate Patient Visual Report: No change from baseline Additional Comments: denies change in vision but also perseverating on OOB and ordering tray   Perception Perception Perception: Impaired Inattention/Neglect: Other (comment) (L inattention during function mobility, running into obstacles wtih Rw on L side)   Praxis Praxis Praxis: Impaired Praxis Impairment Details: Motor planning    Cognition Arousal/Alertness: Awake/alert Behavior During Therapy: Flat affect Overall Cognitive Status: Impaired/Different from baseline Area of Impairment: Attention, Following commands, Safety/judgement, Awareness, Problem solving                   Current Attention Level: Sustained   Following Commands: Follows one step commands with increased time Safety/Judgement: Decreased awareness of safety, Decreased awareness of deficits Awareness: Intellectual Problem Solving: Slow processing, Difficulty sequencing, Requires verbal cues General Comments: pt in bilateral restraints d/t reports of pulling out HD cath from nurse, pt cooperative during session, following all commands with + time. decreased awareness into deficits often running  into obstacles on L side, perseverating on ordering breakfast tray        Exercises      Shoulder Instructions       General Comments assisted pt with ordering breakfast tray during session    Pertinent Vitals/ Pain       Pain Assessment Pain Assessment: No/denies pain  Home Living                                          Prior Functioning/Environment              Frequency  Min 2X/week        Progress Toward Goals  OT Goals(current goals can now be found in the care plan section)  Progress towards OT goals: Progressing toward goals  Acute Rehab OT Goals Patient Stated Goal: none stated Time For Goal Achievement: 10/09/22 Potential to Achieve Goals: Fair ADL Goals Pt Will Perform Grooming: with supervision;standing Pt Will Perform Lower Body Dressing: with min guard assist;sit to/from stand Pt Will Transfer to Toilet: with min guard assist;ambulating  Plan Discharge plan remains appropriate;Frequency remains appropriate    Co-evaluation                 AM-PAC OT "6 Clicks" Daily Activity     Outcome Measure   Help from another person eating meals?: A Little Help from another person taking care of personal grooming?: A Little Help from another person toileting, which includes using toliet, bedpan, or urinal?: A Lot Help from another person bathing (including washing, rinsing, drying)?: A Lot  Help from another person to put on and taking off regular upper body clothing?: A Little Help from another person to put on and taking off regular lower body clothing?: A Lot 6 Click Score: 15    End of Session Equipment Utilized During Treatment: Gait belt;Rolling walker (2 wheels)  OT Visit Diagnosis: Unsteadiness on feet (R26.81);Muscle weakness (generalized) (M62.81);Other abnormalities of gait and mobility (R26.89);Other symptoms and signs involving cognitive function;Cognitive communication deficit (R41.841) Symptoms and signs  involving cognitive functions: Nontraumatic SAH   Activity Tolerance Patient tolerated treatment well   Patient Left in bed;with call bell/phone within reach;with bed alarm set;with restraints reapplied   Nurse Communication Mobility status        Time: 2575-0518 OT Time Calculation (min): 20 min  Charges: OT General Charges $OT Visit: 1 Visit OT Treatments $Self Care/Home Management : 8-22 mins  Harley Alto., COTA/L Acute Rehabilitation Services [phone]   Precious Haws 09/28/2022, 9:40 AM

## 2022-09-28 NOTE — Progress Notes (Signed)
Mobility Specialist: Progress Note   09/28/22 1241  Mobility  Activity Ambulated with assistance in hallway  Level of Assistance Minimal assist, patient does 75% or more  Assistive Device Front wheel walker  Distance Ambulated (ft) 100 ft  Activity Response Tolerated fair  Mobility Referral Yes  $Mobility charge 1 Mobility   Pt received in the bed and agreeable to mobility. Mod I with bed mobility and minA to stand. Verbal cues for RW proximity and upright posture. MinA to balance as pt drifting Lt/Rt throughout. Pt back to bed after session. Restraints in place and bed alarm is on.   [place] Cici Rodriges Mobility Specialist Please contact via SecureChat or Rehab office at [phone]

## 2022-09-28 NOTE — Progress Notes (Signed)
PROGRESS NOTE    Randy Ross  JTT:[id number] DOB: 1970/12/31 DOA: 08/24/2022 PCP: Medicine, [facility]   Brief Narrative:  Randy Ross is a 51 y.o. male with past medical history significant for ESRD on HD, DM2, essential hypertension who presented to [facility] ED with sudden onset slurred speech, headache.  Workup in the ER with elevated BP of 260/140 and CT head showing cerebellar hemorrhage.  Patient was initially intubated in the ED for airway protection and admitted to the PCCM/neurology service.   Significant Hospital events: 11/14: Admitted for acute cerebellar hemorrhage; intubated in the ER, started on hypertonic saline 11/15: Neurosurgery were consulted, recommended against surgical evacuation 11/16: Cefepime started for suspected aspiration 11/17: Required 1u PRBC transfusion due to CRRT filter 11/19: EEG nonspecific 11/21: MRI brain shows new scattered bilateral acute infarcts 11/22: Completed 7 days antibiotics 11/24: IR consulted given recirculation syndrome, high BUN; plan for fistulogram pending 11/27: Temp cath HD cath placed  11/30: Self-extubated 12/1: Persistent delirium 12/2: Transferred out of ICU to [facility] service 12/3: Pulled out cortrak 12/4: Failed SLP, needs cortrak, fistulogram shows poor outflow 12/5: TDC placed, feeding tube replaced 12/6: HD catheter placed by IR 12/9: Family meeting with palliative care, family to decide on possible transitioning to comfort measures  12/11: Family decided to continue aggressive measures, IR consulted for PEG tube 12/12: SLP requesting MBS prior to PEG tube placement; MBS likely plan for 12/12 12/13: MBS done and recommending dysphagia 1 diet with honey thick liquids.  Will see how the patient tolerates p.o. intake and obtain a calorie count prior to initiation of a PEG.  Will continue the Nepro at 50 MLS per hour and Prosource tube feedings once a day as well as considering adjusting if he has  good p.o. intake 12/15: Patient doing fairly well with his calorie count so we will remove his pain to feeding tube and the dietitian adjusted his oral supplements.  He is a little agitated still so required soft mitten restraints.  Allegedly rolled out of the bed but had no acute injuries as a nurse over there.   12/16 he received Seroquel and was not really interactive during the encounter so we will discontinue.  Will not renew soft mitten restraints as we will try and get him to SNF and he needs to be monitored carefully.  Apparently from nursing that he is talking more.  He is back on his Monday Wednesday Friday schedule for hemodialysis and continues to have improvement in his mental status slowly over the week  12/18 and 12/19 the patient is doing much better and is much more awake and alert during the day and interacts and actually speaks to me.  Nursing states that he still intermittently confused and tried to pull out his tunneled catheter when he is out of restraints.  Family states that he is eating fairly well but does not like the hospital food.  Patient is to be dialyzed tomorrow.  Assessment and Plan:  Acute Cerebellar hemorrhage 2/2 poorly controlled hypertension Cerebral edema -Patient presenting to ED via EMS with mental status change, left-sided weakness, slurred speech and left-sided gaze preference.   -Patient was noted to be severely hypertensive on admission.   -Patient was subsequently intubated for airway protection and CT notable for IPH right cerebellar hemisphere with mass effect.   -Patient was started on Cleviprex, Versed and admitted under the critical care/neurology service.  Patient was seen by neurosurgery who did not recommend surgical intervention; and patient was  started on hypertonic saline and placed on CRRT to minimize rapid fluid shifts. -Mental Status is improving , repeat CT head x 3 remained stable overall very poor prognosis and palliative care following.    -Neurology now signed off.   -WBC went from 12.6 -> 11.3 -> 10.5 -> 9.7 -> 10.6 -> 9.7 -> 10.3 and is now 8.7 -Was on CRRT converted back to HD per nephrology and is improving.  Likely will pursue SNF placement given that he is improving -SLP evaluating for Congitive/Language -Will D/C  Daytime Seroquel and remove mitten restraints given that he will need Rehab; Re-initated Night Seroquel  -Will need  -Will hold off PEG currently given MBS results as below and now he is tolerating at diet    Dysphagia, improving  -Etiology low please secondary to persistent encephalopathy, patient has pulled out core track [name], replaced today.   -Palliative care following, plan for family meeting on 12/9 and if no progression towards oral intake and family still wanting aggressive measures will need PEG tube placed if fails his Diet Trial. -Continue tube feeds via core track  -SLP to repeat MBS 12/13 and now on Dysphagia 1 Puree Solids with Honey Thick Liquids; Doing well and Eating well per nursing  -IR consulted for PEG tube placement but will hold of and Discontinue  -Aspiration precautions -Caloric done and he did well so we discontinued his tube feedings -Tolerating his meals without issues but family states that he does not like the food   Lower GI Bleeding: Resolved -Noted by nurse, transient, no change in hemoglobin.  No further bleeding reported. -Continue intermittent monitoring of hemoglobin -Hgb/Hct went from 10.6/32.0 -> 10.7/32.7 -> 11.0/34.2 -> 11.6/34.7 -> 10.8/33.8 -> 11.0/32.6 -> 10.8/34.1 -Continue to monitor for signs and symptoms of bleeding -Repeat CBC in a.m.   Acute Embolic CVA -MR brain 37/90 with many new acute infarcts throughout the bilateral frontal and parietal white matter consistent with embolism.   -TTE with LVEF 50-55%.  LDL 91, hemoglobin A1c 5.9.  -Holding antiplatelet for anemia, cerebellar hemorrhage.  -Neurology now signed off; and do not recommend further  evaluation for cardiac source of embolism as he is not a good long-term anticoagulation candidate.   Hypertensive Emergency Hx Essential Hypertension -BP was severely elevated on admission, likely leading to acute cerebellar hemorrhage. -Continuing Amlodipine 5 mg per tube twice daily, Carvedilol 25 mg per tube twice daily, Hydralazine 100 mg per tube every 8 hours -Continue to Monitor BP per Protocol  -Last BP reading was 141/73   Acute Respiratory Failure with Hypoxia: Resolved Aspiration Pneumonia -Patient was initially intubated for airway protection upon ED arrival, patient self-extubated on 11/30.   -SpO2: 99 % O2 Flow Rate (L/min): 2 L/min FiO2 (%): 30 %; now the patient is off of all supplemental oxygen via nasal cannula -Completed course of antibiotics with cefepime followed by ceftriaxone.   Acute Metabolic Encephalopathy, improving -Patient continues to not be able to follow commands, likely secondary to brain insult but he is much more appropriate but still intermittently agitated and trying to pull out his tunneled HD catheter -SLP consulted for Congnitive and Language Eval  -C/w Supportive care, Seroquel has been adjusted given his somnolence and he just made nightly now but may need something during the day given that he continues to be confused and trying to pull out his lines without safety mittens   Type 2 Diabetes Mellitus -Hemoglobin A1c 5.9 08/25/2022, well-controlled. -C/w Semglee 10 units [place] daily -C/w Moderate  SSI for coverage -CBGs qAC/HS; CBGs ranging from 114-176   ESRD on HD Elevated AG Hyperphosphatemia  -Continue HD per nephrology and patient is going for Dialysis today  -BUN/Cr Trend: Recent Labs  Lab 09/22/22 0343 09/23/22 0546 09/24/22 0958 09/25/22 0358 09/26/22 0415 09/27/22 0403 09/28/22 0341  BUN 79* 135* 73* 38* 64* 85* 45*  CREATININE 6.60* 8.91* 6.73* 4.96* 7.62* 9.65* 6.63*  -Avoid Nephrotoxic Medications, Contrast Dyes, Hypotension  and Dehydration to Ensure Adequate Renal Perfusion and will need to Renally Adjust Meds -Phos level has not improved and gone from 5.2 and is now 4.1 -Patient's CO2 is now 25, anion gap is 14, chloride level is 94 -Continue to Monitor and Trend Renal Function carefully and repeat CMP in the AM    Anemia of Chronic Medical/renal disease -Hemoglobin stable, continue intermittent monitoring.  -Transfuse hemoglobin < 7.0 -Patient's hemoglobin/hematocrit went from 10.6/32.0 -> 10.7/32.7 -> 11.0/34.2 -> 10.9/33.6 -> 11.6/34.7 -> 10.8/33.8 -> 11.0/32.6 -> 10.8/34.1 with an MCV of 70.7 -Repeat CBC in a.m.   Hyponatremia -Patient's sodium went from 131 -> 130 -> 129 x2 -> 133 -> 131 -> 130 and is now 133 -Continue to Monitor and Trend and repeat CMP in the AM    Hypoalbuminemia -Patient's Albumin Level is now 3.4 x2 -> 3.6 -> 3.4 -> 3.3 x2 -Continue to Monitor and Trend -Repeat CMP in the AM    Clotted Fistula -Fistulogram showed poor outflow from fistula, nephrology consulted IR for HD catheter placement which was performed on 12/6.  DVT prophylaxis: heparin injection 5,000 Units Start: 09/12/22 1445 Place and maintain sequential compression device Start: 08/25/22 1025    Code Status: Full Code Family Communication: Discussed with the family at bedside  Disposition Plan:  Level of care: Telemetry Medical Status is: Inpatient Remains inpatient appropriate because: He needs to continue good dialysis and still remains intermittently confused.  Needs further clinical improvement prior to safe discharge disposition to rehabilitation and needs to be out of restraints 24 hours prior to discharging to SNF   Consultants:  Nephrology [facility] Neurosurgery Nephrology Interventional radiology Palliative care  Procedures:  HD Catheter Placement  Antimicrobials:  Anti-infectives (From admission, onward)    Start     Dose/Rate Route Frequency Ordered Stop   09/14/22 1145  ceFAZolin (ANCEF) IVPB  2g/100 mL premix        2 g 200 mL/hr over 30 Minutes Intravenous To Radiology 09/14/22 1123 09/15/22 0905   08/29/22 2000  cefTRIAXone (ROCEPHIN) 2 g in sodium chloride 0.9 % 100 mL IVPB        2 g 200 mL/hr over 30 Minutes Intravenous Every 24 hours 08/29/22 1009 09/01/22 2146   08/28/22 2000  ceFEPIme (MAXIPIME) 1 g in sodium chloride 0.9 % 100 mL IVPB  Status:  Discontinued        1 g 200 mL/hr over 30 Minutes Intravenous Every 24 hours 08/27/22 1009 08/27/22 1016   08/27/22 2000  ceFEPIme (MAXIPIME) 1 g in sodium chloride 0.9 % 100 mL IVPB  Status:  Discontinued        1 g 200 mL/hr over 30 Minutes Intravenous Every 24 hours 08/27/22 1016 08/29/22 1009   08/26/22 1000  ceFEPIme (MAXIPIME) 2 g in sodium chloride 0.9 % 100 mL IVPB  Status:  Discontinued        2 g 200 mL/hr over 30 Minutes Intravenous Every 12 hours 08/26/22 0850 08/27/22 1009       Subjective: Seen and examined at bedside and  he was doing okay laying in the bed and denied any pain.  Tolerated diet and had a bowel movement.  Family states that he does not really like the hospital food today being bringing him food that he likes.  He communicated with me for the first time yesterday and continues to medicate with me.  Nursing states that he still intermittently agitated and continues to try and pull out his lines when he is out of restraints.  No other concerns or complaints at this time.  Objective: Vitals:   09/28/22 0755 09/28/22 1204 09/28/22 1638 09/28/22 1948  BP: (!) 143/71 127/76 136/72 (!) 141/73  Pulse: 73 70 72 71  Resp: '14 18 18 20  '$ Temp: 98.2 F (36.8 C) 98.7 F (37.1 C) 98 F (36.7 C) 98 F (36.7 C)  TempSrc: Oral Oral Axillary Oral  SpO2: 100% 100% 100% 99%  Weight:      Height:        Intake/Output Summary (Last 24 hours) at 09/28/2022 2030 Last data filed at 09/28/2022 1700 Gross per 24 hour  Intake 1440 ml  Output 0 ml  Net 1440 ml   Filed Weights   09/24/22 1839 09/27/22 0715  09/27/22 1209  Weight: 72.1 kg 76.4 kg 74.9 kg   Examination: Physical Exam:  Constitutional: WN/WD overweight male in no acute distress appears calm with family at bedside Respiratory: Diminished to auscultation bilaterally, no wheezing, rales, rhonchi or crackles. Normal respiratory effort and patient is not tachypenic. No accessory muscle use.  Unlabored breathing Cardiovascular: RRR, no murmurs / rubs / gallops. S1 and S2 auscultated. No extremity edema. .  Abdomen: Soft, non-tender, slightly distended secondary to body habitus. Bowel sounds positive.  GU: Deferred. Musculoskeletal: No clubbing / cyanosis of digits/nails. No joint deformity upper and lower extremities.  Has a tunneled HD catheter on the right chest wall Skin: No rashes, lesions, ulcers limited skin evaluation. No induration; Warm and dry.  Neurologic: CN 2-12 grossly intact with no focal deficits. Romberg sign and cerebellar reflexes not assessed.  Psychiatric: Impaired judgment and insight. Alert and oriented x 2. Normal mood and appropriate affect.   Data Reviewed: I have personally reviewed following labs and imaging studies  CBC: Recent Labs  Lab 09/24/22 0958 09/25/22 0358 09/26/22 0415 09/27/22 0403 09/28/22 0341  WBC 9.7 10.6* 9.7 10.3 8.7  NEUTROABS 7.7 8.1* 6.3 7.1 6.1  HGB 10.9* 11.6* 10.8* 11.0* 10.8*  HCT 33.6* 34.7* 33.8* 32.6* 34.1*  MCV 68.6* 68.8* 70.0* 68.1* 70.7*  PLT 290 301 319 314 818   Basic Metabolic Panel: Recent Labs  Lab 09/24/22 0958 09/25/22 0358 09/26/22 0415 09/27/22 0403 09/28/22 0341  NA 129* 133* 131* 130* 133*  K 3.7 3.9 3.7 4.4 4.4  CL 90* 93* 92* 90* 94*  CO2 '26 27 25 22 25  '$ GLUCOSE 188* 96 57* 83 87  BUN 73* 38* 64* 85* 45*  CREATININE 6.73* 4.96* 7.62* 9.65* 6.63*  CALCIUM 9.0 9.3 9.0 8.9 9.0  MG 2.0 1.9 2.0 2.0 2.0  PHOS 3.9 3.0 4.9* 5.2* 4.1   GFR: Estimated Creatinine Clearance: 12.5 mL/min (A) (by C-G formula based on SCr of 6.63 mg/dL (H)). Liver  Function Tests: Recent Labs  Lab 09/24/22 0958 09/25/22 0358 09/26/22 0415 09/27/22 0403 09/28/22 0341  AST 46* 52* 36 26 23  ALT 33 38 37 28 25  ALKPHOS 70 84 81 73 78  BILITOT 0.4 0.5 0.5 0.5 0.3  PROT 7.6 8.0 7.5 7.2 7.4  ALBUMIN  3.4* 3.6 3.4* 3.3* 3.3*   No results for input(s): "LIPASE", "AMYLASE" in the last 168 hours. No results for input(s): "AMMONIA" in the last 168 hours. Coagulation Profile: No results for input(s): "INR", "PROTIME" in the last 168 hours. Cardiac Enzymes: No results for input(s): "CKTOTAL", "CKMB", "CKMBINDEX", "TROPONINI" in the last 168 hours. BNP (last 3 results) No results for input(s): "PROBNP" in the last 8760 hours. HbA1C: No results for input(s): "HGBA1C" in the last 72 hours. CBG: Recent Labs  Lab 09/27/22 1544 09/27/22 2312 09/28/22 0524 09/28/22 1159 09/28/22 1801  GLUCAP 258* 177* 114* 169* 176*   Lipid Profile: Recent Labs    09/26/22 0415  TRIG 66   Thyroid Function Tests: No results for input(s): "TSH", "T4TOTAL", "FREET4", "T3FREE", "THYROIDAB" in the last 72 hours. Anemia Panel: No results for input(s): "VITAMINB12", "FOLATE", "FERRITIN", "TIBC", "IRON", "RETICCTPCT" in the last 72 hours. Sepsis Labs: No results for input(s): "PROCALCITON", "LATICACIDVEN" in the last 168 hours.  No results found for this or any previous visit (from the past 240 hour(s)).   Radiology Studies: No results found.  Scheduled Meds:  amLODipine  5 mg Oral BID   carvedilol  25 mg Oral BID WC   Chlorhexidine Gluconate Cloth  6 each Topical Q0600   darbepoetin (ARANESP) injection - DIALYSIS  150 mcg Subcutaneous Q Mon-1800   feeding supplement (NEPRO CARB STEADY)  237 mL Oral TID WC   heparin injection (subcutaneous)  5,000 Units Subcutaneous Q8H   hydrALAZINE  100 mg Oral Q8H   insulin aspart  0-15 Units Subcutaneous Q6H   insulin glargine-yfgn  10 Units Subcutaneous Daily   multivitamin  1 tablet Oral QHS   mouth rinse  15 mL Mouth  Rinse 4 times per day   pantoprazole  40 mg Oral BID   QUEtiapine  25 mg Oral QHS   sevelamer carbonate  2.4 g Oral TID WC   Continuous Infusions:  sodium chloride Stopped (08/30/22 0033)    LOS: 35 days   Raiford Noble, DO [facility] Available via Epic secure chat 7am-7pm After these hours, please refer to coverage provider listed on [email or web address] 09/28/2022, 8:30 PM

## 2022-09-28 NOTE — Progress Notes (Addendum)
Patient ID: Randy Ross, male   DOB: Jun 25, 1971, 51 y.o.   MRN: [id number] S: Pt seen in room, no new complaints.  Tolerated HD well yesterday with Uf of 1.5 Liters O:BP (!) 143/71 (BP Location: Right Arm)   Pulse 73   Temp 98.2 F (36.8 C) (Oral)   Resp 14   Ht '5\' 5"'$  (1.651 m)   Wt 74.9 kg   SpO2 100%   BMI 27.48 kg/m   Intake/Output Summary (Last 24 hours) at 09/28/2022 0914 Last data filed at 09/28/2022 0400 Gross per 24 hour  Intake 500 ml  Output 1500 ml  Net -1000 ml   Intake/Output: I/O last 3 completed shifts: In: 600 [P.O.:600] Out: 1501 [Other:1500; Stool:1]  Intake/Output this shift:  No intake/output data recorded. Weight change:  Gen: lying in bed in NAD CVS: RRR Resp:CTA Abd: +BS, soft, NT/Nd Ext: no edema   Recent Labs  Lab 09/21/22 1500 09/22/22 0343 09/23/22 0546 09/24/22 0958 09/25/22 0358 09/26/22 0415 09/27/22 0403 09/28/22 0341  NA 131* 130* 129* 129* 133* 131* 130* 133*  K 4.6 3.9 4.4 3.7 3.9 3.7 4.4 4.4  CL 88* 91* 88* 90* 93* 92* 90* 94*  CO2 '24 25 24 26 27 25 22 25  '$ GLUCOSE 125* 108* 111* 188* 96 57* 83 87  BUN 160* 79* 135* 73* 38* 64* 85* 45*  CREATININE 10.17* 6.60* 8.91* 6.73* 4.96* 7.62* 9.65* 6.63*  ALBUMIN 3.2*  --  3.4* 3.4* 3.6 3.4* 3.3* 3.3*  CALCIUM 9.8 9.3 9.6 9.0 9.3 9.0 8.9 9.0  PHOS 5.2*  --  4.9* 3.9 3.0 4.9* 5.2* 4.1  AST  --   --  27 46* 52* 36 26 23  ALT  --   --  14 33 38 37 28 25   Liver Function Tests: Recent Labs  Lab 09/26/22 0415 09/27/22 0403 09/28/22 0341  AST 36 26 23  ALT 37 28 25  ALKPHOS 81 73 78  BILITOT 0.5 0.5 0.3  PROT 7.5 7.2 7.4  ALBUMIN 3.4* 3.3* 3.3*   No results for input(s): "LIPASE", "AMYLASE" in the last 168 hours. No results for input(s): "AMMONIA" in the last 168 hours. CBC: Recent Labs  Lab 09/24/22 0958 09/25/22 0358 09/26/22 0415 09/27/22 0403 09/28/22 0341  WBC 9.7 10.6* 9.7 10.3 8.7  NEUTROABS 7.7 8.1* 6.3 7.1 6.1  HGB 10.9* 11.6* 10.8* 11.0* 10.8*  HCT  33.6* 34.7* 33.8* 32.6* 34.1*  MCV 68.6* 68.8* 70.0* 68.1* 70.7*  PLT 290 301 319 314 330   Cardiac Enzymes: No results for input(s): "CKTOTAL", "CKMB", "CKMBINDEX", "TROPONINI" in the last 168 hours. CBG: Recent Labs  Lab 09/27/22 0428 09/27/22 1335 09/27/22 1544 09/27/22 2312 09/28/22 0524  GLUCAP 90 111* 258* 177* 114*    Iron Studies: No results for input(s): "IRON", "TIBC", "TRANSFERRIN", "FERRITIN" in the last 72 hours. Studies/Results: No results found.  amLODipine  5 mg Oral BID   carvedilol  25 mg Oral BID WC   Chlorhexidine Gluconate Cloth  6 each Topical Q0600   Chlorhexidine Gluconate Cloth  6 each Topical Q0600   Chlorhexidine Gluconate Cloth  6 each Topical Q0600   darbepoetin (ARANESP) injection - DIALYSIS  150 mcg Subcutaneous Q Mon-1800   feeding supplement (NEPRO CARB STEADY)  237 mL Oral TID WC   heparin injection (subcutaneous)  5,000 Units Subcutaneous Q8H   hydrALAZINE  100 mg Oral Q8H   insulin aspart  0-15 Units Subcutaneous Q6H   insulin glargine-yfgn  10 Units Subcutaneous  Daily   multivitamin  1 tablet Oral QHS   mouth rinse  15 mL Mouth Rinse 4 times per day   pantoprazole  40 mg Oral BID   QUEtiapine  25 mg Oral QHS   sevelamer carbonate  2.4 g Oral TID WC    BMET    Component Value Date/Time   NA 133 (L) 09/28/2022 0341   K 4.4 09/28/2022 0341   CL 94 (L) 09/28/2022 0341   CO2 25 09/28/2022 0341   GLUCOSE 87 09/28/2022 0341   BUN 45 (H) 09/28/2022 0341   CREATININE 6.63 (H) 09/28/2022 0341   CREATININE 2.54 (H) 08/09/2019 0827   CALCIUM 9.0 09/28/2022 0341   GFRNONAA 9 (L) 09/28/2022 0341   GFRNONAA NOT CALCULATED 08/09/2019 0827   GFRAA NOT CALCULATED 08/09/2019 0827   CBC    Component Value Date/Time   WBC 8.7 09/28/2022 0341   RBC 4.82 09/28/2022 0341   HGB 10.8 (L) 09/28/2022 0341   HGB 10.5 (L) 08/09/2019 0827   HCT 34.1 (L) 09/28/2022 0341   PLT 330 09/28/2022 0341   PLT 396 08/09/2019 0827   MCV 70.7 (L) 09/28/2022  0341   MCH 22.4 (L) 09/28/2022 0341   MCHC 31.7 09/28/2022 0341   RDW 19.4 (H) 09/28/2022 0341   LYMPHSABS 1.2 09/28/2022 0341   MONOABS 1.2 (H) 09/28/2022 0341   EOSABS 0.2 09/28/2022 0341   BASOSABS 0.1 09/28/2022 0341    Dialysis Orders: SW MWF 3h 74mn  80kg   400/800   2/2 bath  Hep 2000  LFA AVF - last HD 11/13, post 80.1kg - mircera 150 q2, last 11/13 - hectorol 445m IV q HD   Assessment/Plan: 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 S/P course of hypertonic fluids.  No heparin with HD.  AMS - due to #1. Has had sig improvement.  HTN/ vol  - getting po BP meds x 3, BP's good, no edema on exam, new lower dry wt ~72kg. 2.5 L off w/ HD yesterday. ESRD - pt required CRRT 11/15-11/17. Resumed MWF.  HD access - BUN was very high so access recirc was suspected. IR did fistulogram 12/4 and AVF was found be small with competing veins. Using RIJ [place] placed by IR on 12/6.  Anemia of ESRD: Hgb improved to 10.8, continue Aranesp 15073mweekly while here. Secondary HPTH: CCa ok. Renvela 3 ac tid as binder. Continue VDRA.  [facility]

## 2022-09-28 NOTE — Plan of Care (Signed)
  Problem: Activity: Goal: Ability to tolerate increased activity will improve Outcome: Progressing   Problem: Respiratory: Goal: Ability to maintain a clear airway and adequate ventilation will improve Outcome: Progressing   Problem: Role Relationship: Goal: Method of communication will improve Outcome: Progressing   Problem: Education: Goal: Ability to describe self-care measures that may prevent or decrease complications (Diabetes Survival Skills Education) will improve Outcome: Progressing Goal: Individualized Educational Video(s) Outcome: Progressing   Problem: Coping: Goal: Ability to adjust to condition or change in health will improve Outcome: Progressing   Problem: Fluid Volume: Goal: Ability to maintain a balanced intake and output will improve Outcome: Progressing   Problem: Health Behavior/Discharge Planning: Goal: Ability to identify and utilize available resources and services will improve Outcome: Progressing Goal: Ability to manage health-related needs will improve Outcome: Progressing   Problem: Metabolic: Goal: Ability to maintain appropriate glucose levels will improve Outcome: Progressing   Problem: Nutritional: Goal: Maintenance of adequate nutrition will improve Outcome: Progressing Goal: Progress toward achieving an optimal weight will improve Outcome: Progressing   Problem: Skin Integrity: Goal: Risk for impaired skin integrity will decrease Outcome: Progressing   Problem: Tissue Perfusion: Goal: Adequacy of tissue perfusion will improve Outcome: Progressing   Problem: Safety: Goal: Non-violent Restraint(s) Outcome: Progressing   Problem: Education: Goal: Knowledge of General Education information will improve Description: Including pain rating scale, medication(s)/side effects and non-pharmacologic comfort measures Outcome: Progressing   Problem: Health Behavior/Discharge Planning: Goal: Ability to manage health-related needs will  improve Outcome: Progressing   Problem: Clinical Measurements: Goal: Ability to maintain clinical measurements within normal limits will improve Outcome: Progressing Goal: Will remain free from infection Outcome: Progressing Goal: Diagnostic test results will improve Outcome: Progressing Goal: Respiratory complications will improve Outcome: Progressing Goal: Cardiovascular complication will be avoided Outcome: Progressing   Problem: Activity: Goal: Risk for activity intolerance will decrease Outcome: Progressing   Problem: Nutrition: Goal: Adequate nutrition will be maintained Outcome: Progressing   Problem: Coping: Goal: Level of anxiety will decrease Outcome: Progressing   Problem: Elimination: Goal: Will not experience complications related to bowel motility Outcome: Progressing Goal: Will not experience complications related to urinary retention Outcome: Progressing   Problem: Pain Managment: Goal: General experience of comfort will improve Outcome: Progressing   Problem: Safety: Goal: Ability to remain free from injury will improve Outcome: Progressing   Problem: Skin Integrity: Goal: Risk for impaired skin integrity will decrease Outcome: Progressing   Problem: Education: Goal: Knowledge of disease or condition will improve Outcome: Progressing Goal: Knowledge of secondary prevention will improve (MUST DOCUMENT ALL) Outcome: Progressing Goal: Knowledge of patient specific risk factors will improve Elta [place] N/A or DELETE if not current risk factor) Outcome: Progressing

## 2022-09-29 LAB — CBC WITH DIFFERENTIAL/PLATELET
Abs Immature Granulocytes: 0.03 10*3/uL (ref 0.00–0.07)
Basophils Absolute: 0.1 10*3/uL (ref 0.0–0.1)
Basophils Relative: 1 %
Eosinophils Absolute: 0.3 10*3/uL (ref 0.0–0.5)
Eosinophils Relative: 3 %
HCT: 33.2 % — ABNORMAL LOW (ref 39.0–52.0)
Hemoglobin: 10.5 g/dL — ABNORMAL LOW (ref 13.0–17.0)
Immature Granulocytes: 0 %
Lymphocytes Relative: 14 %
Lymphs Abs: 1.2 10*3/uL (ref 0.7–4.0)
MCH: 22.4 pg — ABNORMAL LOW (ref 26.0–34.0)
MCHC: 31.6 g/dL (ref 30.0–36.0)
MCV: 70.8 fL — ABNORMAL LOW (ref 80.0–100.0)
Monocytes Absolute: 0.9 10*3/uL (ref 0.1–1.0)
Monocytes Relative: 10 %
Neutro Abs: 6.3 10*3/uL (ref 1.7–7.7)
Neutrophils Relative %: 72 %
Platelets: 338 10*3/uL (ref 150–400)
RBC: 4.69 MIL/uL (ref 4.22–5.81)
RDW: 19.3 % — ABNORMAL HIGH (ref 11.5–15.5)
WBC: 8.7 10*3/uL (ref 4.0–10.5)
nRBC: 0 % (ref 0.0–0.2)

## 2022-09-29 LAB — COMPREHENSIVE METABOLIC PANEL
ALT: 21 U/L (ref 0–44)
AST: 20 U/L (ref 15–41)
Albumin: 3.2 g/dL — ABNORMAL LOW (ref 3.5–5.0)
Alkaline Phosphatase: 68 U/L (ref 38–126)
Anion gap: 17 — ABNORMAL HIGH (ref 5–15)
BUN: 70 mg/dL — ABNORMAL HIGH (ref 6–20)
CO2: 22 mmol/L (ref 22–32)
Calcium: 8.9 mg/dL (ref 8.9–10.3)
Chloride: 92 mmol/L — ABNORMAL LOW (ref 98–111)
Creatinine, Ser: 9.57 mg/dL — ABNORMAL HIGH (ref 0.61–1.24)
GFR, Estimated: 6 mL/min — ABNORMAL LOW (ref >60)
Glucose, Bld: 124 mg/dL — ABNORMAL HIGH (ref 70–99)
Potassium: 5.2 mmol/L — ABNORMAL HIGH (ref 3.5–5.1)
Sodium: 131 mmol/L — ABNORMAL LOW (ref 135–145)
Total Bilirubin: 0.5 mg/dL (ref 0.3–1.2)
Total Protein: 7.2 g/dL (ref 6.5–8.1)

## 2022-09-29 LAB — TRIGLYCERIDES: Triglycerides: 66 mg/dL (ref <150)

## 2022-09-29 LAB — GLUCOSE, CAPILLARY
Glucose-Capillary: 126 mg/dL — ABNORMAL HIGH (ref 70–99)
Glucose-Capillary: 137 mg/dL — ABNORMAL HIGH (ref 70–99)

## 2022-09-29 LAB — MAGNESIUM: Magnesium: 2 mg/dL (ref 1.7–2.4)

## 2022-09-29 LAB — PHOSPHORUS: Phosphorus: 5.5 mg/dL — ABNORMAL HIGH (ref 2.5–4.6)

## 2022-09-29 MED ORDER — HEPARIN SODIUM (PORCINE) 1000 UNIT/ML DIALYSIS
20.0000 [IU]/kg | INTRAMUSCULAR | Status: DC | PRN
  Administered 2022-09-29: 1500 [IU] via INTRAVENOUS_CENTRAL
  Filled 2022-09-29 (×2): qty 2

## 2022-09-29 MED ORDER — HEPARIN SODIUM (PORCINE) 1000 UNIT/ML IJ SOLN
INTRAMUSCULAR | Status: AC
  Filled 2022-09-29: qty 4

## 2022-09-29 NOTE — Procedures (Signed)
I was present at this dialysis session. I have reviewed the session itself and made appropriate changes.   Vital signs in last 24 hours:  Temp:  [97.8 F (36.6 C)-98.7 F (37.1 C)] 97.9 F (36.6 C) (12/20 0811) Pulse Rate:  [68-75] 75 (12/20 0820) Resp:  [14-20] 19 (12/20 0820) BP: (127-151)/(69-80) 151/75 (12/20 0820) SpO2:  [97 %-100 %] 99 % (12/20 0820) Weight:  [77 kg] 77 kg (12/20 0811) Weight change:  Filed Weights   09/27/22 0715 09/27/22 1209 09/29/22 0811  Weight: 76.4 kg 74.9 kg 77 kg    Recent Labs  Lab 09/28/22 0341  NA 133*  K 4.4  CL 94*  CO2 25  GLUCOSE 87  BUN 45*  CREATININE 6.63*  CALCIUM 9.0  PHOS 4.1    Recent Labs  Lab 09/26/22 0415 09/27/22 0403 09/28/22 0341  WBC 9.7 10.3 8.7  NEUTROABS 6.3 7.1 6.1  HGB 10.8* 11.0* 10.8*  HCT 33.8* 32.6* 34.1*  MCV 70.0* 68.1* 70.7*  PLT 319 314 330    Scheduled Meds:  amLODipine  5 mg Oral BID   carvedilol  25 mg Oral BID WC   Chlorhexidine Gluconate Cloth  6 each Topical Q0600   darbepoetin (ARANESP) injection - DIALYSIS  150 mcg Subcutaneous Q Mon-1800   feeding supplement (NEPRO CARB STEADY)  237 mL Oral TID WC   heparin injection (subcutaneous)  5,000 Units Subcutaneous Q8H   hydrALAZINE  100 mg Oral Q8H   insulin aspart  0-15 Units Subcutaneous Q6H   insulin glargine-yfgn  10 Units Subcutaneous Daily   multivitamin  1 tablet Oral QHS   mouth rinse  15 mL Mouth Rinse 4 times per day   pantoprazole  40 mg Oral BID   QUEtiapine  25 mg Oral QHS   sevelamer carbonate  2.4 g Oral TID WC   Continuous Infusions:  sodium chloride Stopped (08/30/22 0033)   PRN Meds:.sodium chloride, acetaminophen (TYLENOL) oral liquid 160 mg/5 mL, bisacodyl, fentaNYL (SUBLIMAZE) injection, heparin, hydrALAZINE, hydrOXYzine, labetalol, LORazepam, ondansetron (ZOFRAN) IV, mouth rinse   Donetta Potts,  MD 09/29/2022, 8:46 AM

## 2022-09-29 NOTE — TOC Progression Note (Signed)
Transition of Care [facility]) - Progression Note    Patient Details  Name: Randy Ross MRN: [id number] Date of Birth: [birth year]  Transition of Care [facility]) CM/SW Contact  Pollie Friar, RN Phone Number: 09/29/2022, 3:58 PM  Clinical Narrative:    Family interested in [facility] for rehab. Anderson Malta with [facility] has review and reaching out to family. She will begin insurance authorization for admission. TOC following.   Planned Disposition: Skilled Nursing Facility Barriers to Discharge: Continued Medical Work up  Expected Discharge Plan and Services       Living arrangements for the past 2 months: Single Family Home                                       Social Determinants of Health (SDOH) Interventions SDOH Screenings   Tobacco Use: High Risk (09/15/2022)    Readmission Risk Interventions     No data to display

## 2022-09-29 NOTE — Progress Notes (Signed)
Received patient in bed to unit.  Alert  Informed consent signed and in chart.   Treatment initiated: 0827 Treatment completed: 1212  Patient tolerated well.  Transported back to the room  Alert, without acute distress.  Hand-off given to patient's nurse.   Access used: catheter Access issues: n/a  Total UF removed: 1000 Medication(s) given: ativan, PRN heparin   09/29/22 1212  Vitals  Temp 97.8 F (36.6 C)  Temp Source Oral  BP (!) 144/86  MAP (mmHg) 99  BP Location Right Arm  BP Method Automatic  Patient Position (if appropriate) Lying  Pulse Rate 78  ECG Heart Rate 77  Resp 16  Oxygen Therapy  SpO2 99 %  O2 Device Room Air  During Treatment Monitoring  Blood Flow Rate (mL/min) 400 mL/min  Arterial Pressure (mmHg) -120 mmHg  Venous Pressure (mmHg) 140 mmHg  TMP (mmHg) 7 mmHg  Ultrafiltration Rate (mL/min) 470 mL/min  Dialysate Flow Rate (mL/min) 300 ml/min  HD Safety Checks Performed Yes  Intra-Hemodialysis Comments Tx completed  Hemodialysis Catheter Right Subclavian Double lumen Permanent (Tunneled)  Placement Date/Time: 09/15/22 0907   Serial / Lot #: [id number]  Expiration Date: 04/26/27  Time Out: Correct patient;Correct site;Correct procedure  Maximum sterile barrier precautions: Hand hygiene;Cap;Mask;Sterile gown;Sterile gloves;Large sterile ...  Site Condition No complications  Blue Lumen Status Heparin locked  Catheter fill volume (Arterial) 1.6 cc  Catheter fill volume (Venous) 1.6  Dressing Status Antimicrobial disc in place  Dressing Change Due 10/04/22            [facility]

## 2022-09-29 NOTE — Progress Notes (Addendum)
PT Cancellation Note  Patient Details Name: Randy Ross MRN: [id number] DOB: December 07, 1970   Cancelled Treatment:    Reason Eval/Treat Not Completed: (P) Patient at procedure or test/unavailable off unit at HD. Will check back as schedule allows to continue with PT POC.  15:10 re-attempted as pt back from HD, pt sleeping with difficulty being woken, opened eyes slightly, this PTA introduced herself, pt went immediately back to sleep. Will plan to see tomorrow to continue with POC.   Audry Riles. PTA Acute Rehabilitation Services Office: [place] 09/29/2022, 9:42 AM

## 2022-09-29 NOTE — Progress Notes (Signed)
PROGRESS NOTE  Randy Ross CWC:[id number] DOB: 10/22/1970 DOA: 08/24/2022 PCP: Medicine, [facility]   LOS: 36 days   Brief Narrative / Interim history: Randy Ross is a 51 y.o. male with past medical history significant for ESRD on HD, DM2, essential hypertension who presented to [facility] ED with sudden onset slurred speech, headache.  Workup in the ER with elevated BP of 260/140 and CT head showing cerebellar hemorrhage.  Patient was initially intubated in the ED for airway protection and admitted to the PCCM/neurology service.   Significant events: 11/14: Admitted for acute cerebellar hemorrhage; intubated in the ER, started on hypertonic saline 11/15: Neurosurgery were consulted, recommended against surgical evacuation 11/16: Cefepime started for suspected aspiration 11/17: Required 1u PRBC transfusion due to CRRT filter 11/19: EEG nonspecific 11/21: MRI brain shows new scattered bilateral acute infarcts 11/22: Completed 7 days antibiotics 11/24: IR consulted given recirculation syndrome, high BUN; plan for fistulogram pending 11/27: Temp cath HD cath placed  11/30: Self-extubated 12/1: Persistent delirium 12/2: Transferred out of ICU to [employer] service 12/3: Pulled out cortrak 12/4: Failed SLP, needs cortrak, fistulogram shows poor outflow 12/5: TDC placed, feeding tube replaced 12/6: HD catheter placed by IR 12/9: Family meeting with palliative care, family to decide on possible transitioning to comfort measures  12/11: Family decided to continue aggressive measures, IR consulted for PEG tube 12/12: SLP requesting MBS prior to PEG tube placement; MBS likely plan for 12/12 12/13: MBS done and recommending dysphagia 1 diet with honey thick liquids  Subjective / 24h Interval events: No complaints for me. Wants breakfast  Assesement and Plan: Principal Problem:   Acute cerebellar hemorrhage (HCC) Active Problems:   Anemia   ESRD (end stage renal  disease) (HCC)   Diabetes mellitus ([place])   Acute respiratory failure with hypoxia (HCC)   Hypertensive emergency   Pressure injury of skin   Acute metabolic encephalopathy and delirium   Cerebral edema (HCC)   Acute embolic stroke ([place])   Lower GI bleeding   Dysphagia   Palliative care by specialist   Principal problem Acute Cerebellar hemorrhage 2/2 poorly controlled hypertension, cerebral edema -Patient presenting to ED via EMS with mental status change, left-sided weakness, slurred speech and left-sided gaze preference.  He was severely hypertensive on admission, was placed in the ICU and was intubated for airway protection, 1 was placed on Cleviprex infusion. CT scan was notable for IPH right cerebellar hemisphere with mass effect.  He eventually improved, neurology signed off, and was transferred to the hospitalist service.  Active problems  Dysphagia, improving -initially requiring a core track, there were plans for PEG tube but he is improving, eating on his own, now does not need a PEG nor has the core track anymore.  Lower GI Bleeding -Noted by nurse, transient, no change in hemoglobin.  No further bleeding reported.  Monitor hemoglobin   Acute Embolic CVA -MR brain 76/16 with many new acute infarcts throughout the bilateral frontal and parietal white matter consistent with embolism. TTE with LVEF 50-55%.  LDL 91, hemoglobin A1c 5.9. Holding antiplatelets due to cerebellar hemorrhage. Neurology now signed off; and do not recommend further evaluation for cardiac source of embolism as he is not a good long-term anticoagulation candidate.   Hypertensive Emergency -BP was severely elevated on admission, likely leading to acute cerebellar hemorrhage.  Currently on amlodipine, Coreg, hydralazine  Acute Respiratory Failure with Hypoxia - Resolved  Aspiration Pneumonia -completed antibiotics   Acute Metabolic Encephalopathy -better, but remains impulsive at  times, trying to pull out his  tunneled HD cath and requiring wrist restraint   Type 2 Diabetes Mellitus-Hemoglobin A1c 5.9 08/25/2022, well-controlled.   ESRD on HD -Continue HD per nephrology, HD today    Anemia of Chronic Medical/renal disease -Hemoglobin stable, continue intermittent monitoring.    Hyponatremia -mild, monitor    Clotted Fistula -Fistulogram showed poor outflow from fistula, nephrology consulted IR for HD catheter placement which was performed on 12/6.    Scheduled Meds:  amLODipine  5 mg Oral BID   carvedilol  25 mg Oral BID WC   Chlorhexidine Gluconate Cloth  6 each Topical Q0600   darbepoetin (ARANESP) injection - DIALYSIS  150 mcg Subcutaneous Q Mon-1800   feeding supplement (NEPRO CARB STEADY)  237 mL Oral TID WC   heparin injection (subcutaneous)  5,000 Units Subcutaneous Q8H   heparin sodium (porcine)       hydrALAZINE  100 mg Oral Q8H   insulin aspart  0-15 Units Subcutaneous Q6H   insulin glargine-yfgn  10 Units Subcutaneous Daily   multivitamin  1 tablet Oral QHS   mouth rinse  15 mL Mouth Rinse 4 times per day   pantoprazole  40 mg Oral BID   QUEtiapine  25 mg Oral QHS   sevelamer carbonate  2.4 g Oral TID WC   Continuous Infusions:  sodium chloride Stopped (08/30/22 0033)   PRN Meds:.sodium chloride, acetaminophen (TYLENOL) oral liquid 160 mg/5 mL, bisacodyl, fentaNYL (SUBLIMAZE) injection, heparin sodium (porcine), hydrALAZINE, hydrOXYzine, labetalol, LORazepam, ondansetron (ZOFRAN) IV, mouth rinse  Current Outpatient Medications  Medication Instructions   acetaminophen (TYLENOL) 650 mg, Oral, Every 6 hours PRN   amLODipine (NORVASC) 10 mg, Oral, Daily at bedtime   calcitRIOL (ROCALTROL) 0.25 mcg, Oral, Daily   calcium acetate (PHOSLO) 1,334 mg, Oral, 3 times daily   carvedilol (COREG) 25 mg, Oral, Daily   Emollient (CETAPHIL) cream 1 application , Topical, Daily PRN   isosorbide mononitrate (IMDUR) 30 mg, Oral, Daily at bedtime   losartan (COZAAR) 100 mg, Oral, Daily    multivitamin (RENA-VIT) TABS tablet 1 tablet, Oral, Daily at bedtime   PRESCRIPTION MEDICATION 10 Units, Subcutaneous, Daily, Toujeo   Semglee (yfgn) 10 Units, Subcutaneous, Daily   tetrahydrozoline-zinc (VISINE-AC) 0.05-0.25 % ophthalmic solution 1 drop, Both Eyes, 3 times daily PRN    Diet Orders (From admission, onward)     Start     Ordered   09/28/22 0833  DIET DYS 3 Room service appropriate? No; Fluid consistency: Thin  Diet effective now       Question Answer Comment  Room service appropriate? No   Fluid consistency: Thin      09/28/22 0832            DVT prophylaxis: heparin injection 5,000 Units Start: 09/12/22 1445 Place and maintain sequential compression device Start: 08/25/22 1025   Lab Results  Component Value Date   PLT 338 09/29/2022      Code Status: Full Code  Family Communication: no family at bedside   Status is: Inpatient  Remains inpatient appropriate because: needs placement  Level of care: Telemetry Medical  Consultants:  Neurology Nephrology   Objective: Vitals:   09/29/22 1130 09/29/22 1200 09/29/22 1212 09/29/22 1224  BP: 133/74 131/74 (!) 144/86   Pulse:   78   Resp: '15 15 16   '$ Temp:   97.8 F (36.6 C)   TempSrc:   Oral   SpO2:   99%   Weight:    75.1 kg  Height:        Intake/Output Summary (Last 24 hours) at 09/29/2022 1244 Last data filed at 09/29/2022 1212 Gross per 24 hour  Intake 840 ml  Output 1 ml  Net 839 ml   Wt Readings from Last 3 Encounters:  09/29/22 75.1 kg  04/19/22 80.3 kg  03/10/22 81.3 kg    Examination:  Constitutional: NAD Eyes: no scleral icterus ENMT: Mucous membranes are moist.  Neck: normal, supple Respiratory: clear to auscultation bilaterally, no wheezing, no crackles. Normal respiratory effort. No accessory muscle use.  Cardiovascular: Regular rate and rhythm, no murmurs / rubs / gallops. No LE edema.  Abdomen: non distended, no tenderness. Bowel sounds positive.  Musculoskeletal:  no clubbing / cyanosis.    Data Reviewed: I have independently reviewed following labs and imaging studies   CBC Recent Labs  Lab 09/25/22 0358 09/26/22 0415 09/27/22 0403 09/28/22 0341 09/29/22 0821  WBC 10.6* 9.7 10.3 8.7 8.7  HGB 11.6* 10.8* 11.0* 10.8* 10.5*  HCT 34.7* 33.8* 32.6* 34.1* 33.2*  PLT 301 319 314 330 338  MCV 68.8* 70.0* 68.1* 70.7* 70.8*  MCH 23.0* 22.4* 23.0* 22.4* 22.4*  MCHC 33.4 32.0 33.7 31.7 31.6  RDW 19.7* 19.8* 19.6* 19.4* 19.3*  LYMPHSABS 1.0 1.7 1.4 1.2 1.2  MONOABS 1.3* 1.5* 1.3* 1.2* 0.9  EOSABS 0.0 0.2 0.3 0.2 0.3  BASOSABS 0.0 0.1 0.1 0.1 0.1    Recent Labs  Lab 09/25/22 0358 09/26/22 0415 09/27/22 0403 09/28/22 0341 09/29/22 0821  NA 133* 131* 130* 133* 131*  K 3.9 3.7 4.4 4.4 5.2*  CL 93* 92* 90* 94* 92*  CO2 '27 25 22 25 22  '$ GLUCOSE 96 57* 83 87 124*  BUN 38* 64* 85* 45* 70*  CREATININE 4.96* 7.62* 9.65* 6.63* 9.57*  CALCIUM 9.3 9.0 8.9 9.0 8.9  AST 52* 36 '26 23 20  '$ ALT 38 37 '28 25 21  '$ ALKPHOS 84 81 73 78 68  BILITOT 0.5 0.5 0.5 0.3 0.5  ALBUMIN 3.6 3.4* 3.3* 3.3* 3.2*  MG 1.9 2.0 2.0 2.0 2.0    ------------------------------------------------------------------------------------------------------------------ Recent Labs    09/29/22 0821  TRIG 66    Lab Results  Component Value Date   HGBA1C 5.9 (H) 08/25/2022   ------------------------------------------------------------------------------------------------------------------ No results for input(s): "TSH", "T4TOTAL", "T3FREE", "THYROIDAB" in the last 72 hours.  Invalid input(s): "FREET3"  Cardiac Enzymes No results for input(s): "CKMB", "TROPONINI", "MYOGLOBIN" in the last 168 hours.  Invalid input(s): "CK" ------------------------------------------------------------------------------------------------------------------ No results found for: "BNP"  CBG: Recent Labs  Lab 09/28/22 0524 09/28/22 1159 09/28/22 1801 09/28/22 2039 09/28/22 2351  GLUCAP 114*  169* 176* 177* 132*    No results found for this or any previous visit (from the past 240 hour(s)).   Radiology Studies: No results found.   Marzetta Board, MD, PhD [facility]  Between 7 am - 7 pm I am available, please contact me via Amion (for emergencies) or Securechat (non urgent messages)  Between 7 pm - 7 am I am not available, please contact night coverage MD/APP via Amion

## 2022-09-30 DIAGNOSIS — N185 Chronic kidney disease, stage 5: Secondary | ICD-10-CM | POA: Diagnosis not present

## 2022-09-30 DIAGNOSIS — T82898A Other specified complication of vascular prosthetic devices, implants and grafts, initial encounter: Secondary | ICD-10-CM | POA: Diagnosis not present

## 2022-09-30 LAB — GLUCOSE, CAPILLARY
Glucose-Capillary: 120 mg/dL — ABNORMAL HIGH (ref 70–99)
Glucose-Capillary: 122 mg/dL — ABNORMAL HIGH (ref 70–99)
Glucose-Capillary: 140 mg/dL — ABNORMAL HIGH (ref 70–99)
Glucose-Capillary: 148 mg/dL — ABNORMAL HIGH (ref 70–99)
Glucose-Capillary: 201 mg/dL — ABNORMAL HIGH (ref 70–99)
Glucose-Capillary: 96 mg/dL (ref 70–99)

## 2022-09-30 MED ORDER — CHLORHEXIDINE GLUCONATE CLOTH 2 % EX PADS
6.0000 | MEDICATED_PAD | Freq: Every day | CUTANEOUS | Status: DC
  Administered 2022-10-02 – 2022-10-12 (×10): 6 via TOPICAL

## 2022-09-30 NOTE — Progress Notes (Signed)
Pt's chart reviewed and contacted CSW to discuss d/c plans. Insurance auth for [employer] is pending. Referral submitted to [facility] for review for out-pt HD clinic if pt's insurance approves [facility]. Clinicals faxed today to [facility] for review. Will assist as needed.   [name] Renal Navigator [phone]

## 2022-09-30 NOTE — Progress Notes (Signed)
PROGRESS NOTE  Rhone Ozaki JOA:[id number] DOB: [birth year] DOA: 08/24/2022 PCP: Medicine, [facility]   LOS: 37 days   Brief Narrative / Interim history: Lean Cabler is a 51 y.o. male with past medical history significant for ESRD on HD, DM2, essential hypertension who presented to [facility] ED with sudden onset slurred speech, headache.  Workup in the ER with elevated BP of 260/140 and CT head showing cerebellar hemorrhage.  Patient was initially intubated in the ED for airway protection and admitted to the PCCM/neurology service.   Significant events: 11/14: Admitted for acute cerebellar hemorrhage; intubated in the ER, started on hypertonic saline 11/15: Neurosurgery were consulted, recommended against surgical evacuation 11/16: Cefepime started for suspected aspiration 11/17: Required 1u PRBC transfusion due to CRRT filter 11/19: EEG nonspecific 11/21: MRI brain shows new scattered bilateral acute infarcts 11/22: Completed 7 days antibiotics 11/24: IR consulted given recirculation syndrome, high BUN; plan for fistulogram pending 11/27: Temp cath HD cath placed  11/30: Self-extubated 12/1: Persistent delirium 12/2: Transferred out of ICU to [facility] service 12/3: Pulled out cortrak 12/4: Failed SLP, needs cortrak, fistulogram shows poor outflow 12/5: TDC placed, feeding tube replaced 12/6: HD catheter placed by IR 12/9: Family meeting with palliative care, family to decide on possible transitioning to comfort measures  12/11: Family decided to continue aggressive measures, IR consulted for PEG tube 12/12: SLP requesting MBS prior to PEG tube placement; MBS likely plan for 12/12 12/13: MBS done and recommending dysphagia 1 diet with honey thick liquids  Subjective / 24h Interval events: No complaints  Assesement and Plan: Principal Problem:   Acute cerebellar hemorrhage ([place]) Active Problems:   Anemia   ESRD (end stage renal disease) ([place])   Diabetes  mellitus ([place])   Acute respiratory failure with hypoxia ([place])   Hypertensive emergency   Pressure injury of skin   Acute metabolic encephalopathy and delirium   Cerebral edema ([place])   Acute embolic stroke ([place])   Lower GI bleeding   Dysphagia   Palliative care by specialist   Principal problem Acute Cerebellar hemorrhage 2/2 poorly controlled hypertension, cerebral edema -Patient presenting to ED via EMS with mental status change, left-sided weakness, slurred speech and left-sided gaze preference.  He was severely hypertensive on admission, was placed in the ICU and was intubated for airway protection, 1 was placed on Cleviprex infusion. CT scan was notable for IPH right cerebellar hemisphere with mass effect.  He eventually improved, neurology signed off, and was transferred to the hospitalist service.  Neurologically he has remained stable  Active problems  Dysphagia, improving -initially requiring a core track, there were plans for PEG tube but he is improving, eating on his own, now does not need a PEG nor has the core track anymore.  Able to eat well this morning  Lower GI Bleeding -Noted by nurse, transient, no change in hemoglobin.  No further bleeding reported.  Monitor hemoglobin, overall stable, repeat in the morning   Acute Embolic CVA -MR brain 60/10 with many new acute infarcts throughout the bilateral frontal and parietal white matter consistent with embolism. TTE with LVEF 50-55%.  LDL 91, hemoglobin A1c 5.9. Holding antiplatelets due to cerebellar hemorrhage. Neurology now signed off; and do not recommend further evaluation for cardiac source of embolism as he is not a good long-term anticoagulation candidate.   Hypertensive Emergency -BP was severely elevated on admission, likely leading to acute cerebellar hemorrhage.  Currently on amlodipine, Coreg, hydralazine.  Blood pressure within normal parameters  Acute  Respiratory Failure with Hypoxia - Resolved  Aspiration  Pneumonia -completed antibiotics   Acute Metabolic Encephalopathy -better, but remains impulsive at times, trying to pull out his tunneled HD cath and requiring wrist restraint   Type 2 Diabetes Mellitus-Hemoglobin A1c 5.9 08/25/2022, well-controlled.   ESRD on HD -Continue HD per nephrology, vascular consulted, may need fistula revision   Anemia of Chronic Medical/renal disease -Hemoglobin stable, continue intermittent monitoring.    Hyponatremia -mild, monitor    Clotted Fistula -Fistulogram showed poor outflow from fistula, nephrology consulted IR for HD catheter placement which was performed on 12/6.    Scheduled Meds:  amLODipine  5 mg Oral BID   carvedilol  25 mg Oral BID WC   Chlorhexidine Gluconate Cloth  6 each Topical Q0600   [START ON 10/01/2022] Chlorhexidine Gluconate Cloth  6 each Topical Q0600   darbepoetin (ARANESP) injection - DIALYSIS  150 mcg Subcutaneous Q Mon-1800   feeding supplement (NEPRO CARB STEADY)  237 mL Oral TID WC   heparin injection (subcutaneous)  5,000 Units Subcutaneous Q8H   hydrALAZINE  100 mg Oral Q8H   insulin aspart  0-15 Units Subcutaneous Q6H   insulin glargine-yfgn  10 Units Subcutaneous Daily   multivitamin  1 tablet Oral QHS   mouth rinse  15 mL Mouth Rinse 4 times per day   pantoprazole  40 mg Oral BID   QUEtiapine  25 mg Oral QHS   sevelamer carbonate  2.4 g Oral TID WC   Continuous Infusions:  sodium chloride Stopped (08/30/22 0033)   PRN Meds:.sodium chloride, acetaminophen (TYLENOL) oral liquid 160 mg/5 mL, bisacodyl, fentaNYL (SUBLIMAZE) injection, hydrALAZINE, hydrOXYzine, labetalol, LORazepam, ondansetron (ZOFRAN) IV, mouth rinse  Current Outpatient Medications  Medication Instructions   acetaminophen (TYLENOL) 650 mg, Oral, Every 6 hours PRN   amLODipine (NORVASC) 10 mg, Oral, Daily at bedtime   calcitRIOL (ROCALTROL) 0.25 mcg, Oral, Daily   calcium acetate (PHOSLO) 1,334 mg, Oral, 3 times daily   carvedilol (COREG) 25  mg, Oral, Daily   Emollient (CETAPHIL) cream 1 application , Topical, Daily PRN   isosorbide mononitrate (IMDUR) 30 mg, Oral, Daily at bedtime   losartan (COZAAR) 100 mg, Oral, Daily   multivitamin (RENA-VIT) TABS tablet 1 tablet, Oral, Daily at bedtime   PRESCRIPTION MEDICATION 10 Units, Subcutaneous, Daily, Toujeo   Semglee (yfgn) 10 Units, Subcutaneous, Daily   tetrahydrozoline-zinc (VISINE-AC) 0.05-0.25 % ophthalmic solution 1 drop, Both Eyes, 3 times daily PRN    Diet Orders (From admission, onward)     Start     Ordered   09/30/22 0930  Diet regular Room service appropriate? Yes with Assist; Fluid consistency: Thin  Diet effective now       Question Answer Comment  Room service appropriate? Yes with Assist   Fluid consistency: Thin      09/30/22 0930            DVT prophylaxis: heparin injection 5,000 Units Start: 09/12/22 1445 Place and maintain sequential compression device Start: 08/25/22 1025   Lab Results  Component Value Date   PLT 338 09/29/2022      Code Status: Full Code  Family Communication: no family at bedside   Status is: Inpatient  Remains inpatient appropriate because: needs placement  Level of care: Telemetry Medical  Consultants:  Neurology Nephrology   Objective: Vitals:   09/30/22 0026 09/30/22 0358 09/30/22 0815 09/30/22 1218  BP: 138/73 139/84 114/74 137/63  Pulse: 75 76 75 78  Resp: '18 18 18 '$ 18  Temp: 98.4 F (36.9 C) 97.8 F (36.6 C) 98.5 F (36.9 C) 98.6 F (37 C)  TempSrc: Oral Oral    SpO2: 99% 96% 100% 98%  Weight:      Height:        Intake/Output Summary (Last 24 hours) at 09/30/2022 1309 Last data filed at 09/29/2022 2200 Gross per 24 hour  Intake 420 ml  Output 1 ml  Net 419 ml    Wt Readings from Last 3 Encounters:  09/29/22 75.1 kg  04/19/22 80.3 kg  03/10/22 81.3 kg    Examination:  Constitutional: NAD Eyes: lids and conjunctivae normal, no scleral icterus ENMT: mmm Neck: normal,  supple Respiratory: clear to auscultation bilaterally, no wheezing, no crackles. Normal respiratory effort.  Cardiovascular: Regular rate and rhythm, no murmurs / rubs / gallops. No LE edema. Abdomen: soft, no distention, no tenderness. Bowel sounds positive.  Skin: no rashes Neurologic: no focal deficits, equal strength   Data Reviewed: I have independently reviewed following labs and imaging studies   CBC Recent Labs  Lab 09/25/22 0358 09/26/22 0415 09/27/22 0403 09/28/22 0341 09/29/22 0821  WBC 10.6* 9.7 10.3 8.7 8.7  HGB 11.6* 10.8* 11.0* 10.8* 10.5*  HCT 34.7* 33.8* 32.6* 34.1* 33.2*  PLT 301 319 314 330 338  MCV 68.8* 70.0* 68.1* 70.7* 70.8*  MCH 23.0* 22.4* 23.0* 22.4* 22.4*  MCHC 33.4 32.0 33.7 31.7 31.6  RDW 19.7* 19.8* 19.6* 19.4* 19.3*  LYMPHSABS 1.0 1.7 1.4 1.2 1.2  MONOABS 1.3* 1.5* 1.3* 1.2* 0.9  EOSABS 0.0 0.2 0.3 0.2 0.3  BASOSABS 0.0 0.1 0.1 0.1 0.1     Recent Labs  Lab 09/25/22 0358 09/26/22 0415 09/27/22 0403 09/28/22 0341 09/29/22 0821  NA 133* 131* 130* 133* 131*  K 3.9 3.7 4.4 4.4 5.2*  CL 93* 92* 90* 94* 92*  CO2 '27 25 22 25 22  '$ GLUCOSE 96 57* 83 87 124*  BUN 38* 64* 85* 45* 70*  CREATININE 4.96* 7.62* 9.65* 6.63* 9.57*  CALCIUM 9.3 9.0 8.9 9.0 8.9  AST 52* 36 '26 23 20  '$ ALT 38 37 '28 25 21  '$ ALKPHOS 84 81 73 78 68  BILITOT 0.5 0.5 0.5 0.3 0.5  ALBUMIN 3.6 3.4* 3.3* 3.3* 3.2*  MG 1.9 2.0 2.0 2.0 2.0     ------------------------------------------------------------------------------------------------------------------ Recent Labs    09/29/22 0821  TRIG 66     Lab Results  Component Value Date   HGBA1C 5.9 (H) 08/25/2022   ------------------------------------------------------------------------------------------------------------------ No results for input(s): "TSH", "T4TOTAL", "T3FREE", "THYROIDAB" in the last 72 hours.  Invalid input(s): "FREET3"  Cardiac Enzymes No results for input(s): "CKMB", "TROPONINI", "MYOGLOBIN" in  the last 168 hours.  Invalid input(s): "CK" ------------------------------------------------------------------------------------------------------------------ No results found for: "BNP"  CBG: Recent Labs  Lab 09/29/22 1844 09/30/22 0024 09/30/22 0357 09/30/22 0601 09/30/22 1218  GLUCAP 137* 120* 148* 96 122*     No results found for this or any previous visit (from the past 240 hour(s)).   Radiology Studies: No results found.   Marzetta Board, MD, PhD [facility]  Between 7 am - 7 pm I am available, please contact me via Amion (for emergencies) or Securechat (non urgent messages)  Between 7 pm - 7 am I am not available, please contact night coverage MD/APP via Amion

## 2022-09-30 NOTE — TOC Progression Note (Signed)
Transition of Care [facility]) - Progression Note    Patient Details  Name: Colten Desroches MRN: [id number] Date of Birth: Jan 27, 1971  Transition of Care [facility]) CM/SW Contact  Jinger Neighbors, [place] Phone Number: 09/30/2022, 11:23 AM  Clinical Narrative:     CSW followed up with Crystal at [facility] to inquire about status of auth. Crystal reports [facility] is still pending. Dialysis will be scheduled once [facility] is approved. CSW conversed with Anderson Malta, pt's sister, who reports [facility] is ok with family.   Planned Disposition: Skilled Nursing Facility Barriers to Discharge: Continued Medical Work up  Expected Discharge Plan and Services       Living arrangements for the past 2 months: Single Family Home                                       Social Determinants of Health (SDOH) Interventions SDOH Screenings   Tobacco Use: High Risk (09/15/2022)    Readmission Risk Interventions     No data to display

## 2022-09-30 NOTE — Progress Notes (Signed)
Speech Language Pathology Treatment: Dysphagia;Cognitive-Linquistic  Patient Details Name: Randy Ross MRN: [id number] DOB: 07/08/1971 Today's Date: 09/30/2022 Time: 0910-0930 SLP Time Calculation (min) (ACUTE ONLY): 20 min  Assessment / Plan / Recommendation Clinical Impression  Pt attentive to oral intake if self initiated. SLP cued pt to express wants and needs when resistant and pt more willing to participate on his own terms. Tolerating regular solids and thin liquids if sitting upright. Willing to take meds if not crushed. Should be able to take with thins if upright. May need to be cues to take small sips. Can also try whole in puree. Pt demonstrating basic problem solving and requesting help as needed. Will upgrade to regular diet, try to ask pt what he wants to order. Will see once a week.   HPI HPI: Pt is a 51 y.o. male who presented with sudden onset slurred speech, chest pain, left sided weakness. Surrounding edema in the face of the fourth ventricle without hydrocephalus. 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 ETT 11/14-11/30 (self-extubated). Palliative meeting 12/1: full scope. PMH: end-stage renal disease on intermittent hemodialysis, diabetes, hypertension.      SLP Plan  Continue with current plan of care      Recommendations for follow up therapy are one component of a multi-disciplinary discharge planning process, led by the attending physician.  Recommendations may be updated based on patient status, additional functional criteria and insurance authorization.    Recommendations  Diet recommendations: Regular;Thin liquid Liquids provided via: Cup;Straw Medication Administration: Whole meds with liquid                Follow Up Recommendations: Skilled nursing-short term rehab (<3 hours/day) Assistance recommended at discharge: Frequent or constant Supervision/Assistance Plan: Continue with current plan of  care           Randy Ross, Randy Ross  09/30/2022, 9:26 AM

## 2022-09-30 NOTE — Consult Note (Addendum)
CONSULT NOTE   MRN : [id number]  Reason for Consult: Malfunctioning left forearm av fistula Referring Physician: Nephrology Dollene Cleveland PA-C  History of Present Illness: 51 y/o male known to our practice secondary to HD access.  He was admitted to [facility] secondary to cerebellar hemorrhage/ bleed  w/ 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  He is currently coherent and follows requests appropriately.     He is not a good historian.  I talked with Dollene Cleveland Nephrology PA-C  HD access - BUN was very high so access recirc was suspected. Using temp HD cath placed 11/27 by CCM and BUN better. IR consulted for fistulogram.  The Left forearm RC AV fistula was created 12/10/21 BY Dr. Donzetta Matters.  No previous access.  Palliative consult: Family decided to continue aggressive measures, IR consulted for PEG tube.  Neurology now signed off; and do not recommend further evaluation for cardiac source of embolism as he is not a good long-term anticoagulation candidate.      Current Facility-Administered Medications  Medication Dose Route Frequency Provider Last Rate Last Admin   0.9 %  sodium chloride infusion   Intravenous PRN Bhagat, Srishti L, MD   Stopped at 08/30/22 0033   acetaminophen (TYLENOL) 160 MG/5ML solution 650 mg  650 mg Per Tube Q4H PRN Elsie Lincoln, MD   650 mg at 09/19/22 1322   amLODipine (NORVASC) tablet 5 mg  5 mg Oral BID Sheikh, Omair [place], DO   5 mg at 09/30/22 8466   bisacodyl (DULCOLAX) suppository 10 mg  10 mg Rectal Daily PRN Janine Ores, NP   10 mg at 09/24/22 1418   carvedilol (COREG) tablet 25 mg  25 mg Oral BID WC SheikhGeorgina Quint Dana, DO   25 mg at 09/30/22 5993   Chlorhexidine Gluconate Cloth 2 % PADS 6 each  6 each Topical Q0600 Roney Jaffe, MD   6 each at 09/30/22 0555   [START ON 10/01/2022] Chlorhexidine Gluconate Cloth 2 % PADS 6 each  6 each Topical Q0600 Janalee Dane, PA-C       Darbepoetin Alfa (ARANESP) injection 150 mcg  150 mcg Subcutaneous Q  Mon-1800 Donnamae Jude, RPH   150 mcg at 09/27/22 1756   feeding supplement (NEPRO CARB STEADY) liquid 237 mL  237 mL Oral TID WC SheikhGeorgina Quint [place], DO   237 mL at 09/30/22 0856   fentaNYL (SUBLIMAZE) injection 50 mcg  50 mcg Intravenous Q2H PRN Cristal Generous, NP   50 mcg at 09/14/22 2124   heparin injection 5,000 Units  5,000 Units Subcutaneous Marland Kitchen, MD   5,000 Units at 09/30/22 0554   hydrALAZINE (APRESOLINE) injection 20 mg  20 mg Intravenous Q6H PRN Edwin Dada, MD   20 mg at 09/15/22 5701   hydrALAZINE (APRESOLINE) tablet 100 mg  100 mg Oral [address] Omair [place], DO   100 mg at 09/30/22 0554   hydrOXYzine (ATARAX) tablet 25 mg  25 mg Oral TID PRN Quintella Baton, MD   25 mg at 09/30/22 0902   insulin aspart (novoLOG) injection 0-15 Units  0-15 Units Subcutaneous Q6H Dorene Grebe, MD   2 Units at 09/29/22 1845   insulin glargine-yfgn [facility]) injection 10 Units  10 Units Subcutaneous Daily Dorene Grebe, MD   10 Units at 09/30/22 0855   labetalol (NORMODYNE) injection 5-20 mg  5-20 mg Intravenous Q10 min PRN Rosalin Hawking, MD   20 mg at 09/15/22 [phone]  LORazepam (ATIVAN) injection 0.5 mg  0.5 mg Intravenous Q6H PRN [place] Donnamarie Poag, DO   0.5 mg at 09/29/22 6384   multivitamin (RENA-VIT) tablet 1 tablet  1 tablet Oral QHS Raiford Noble Dolores, DO   1 tablet at 09/29/22 2139   ondansetron [facility]) injection 4 mg  4 mg Intravenous Q6H PRN Rosalin Hawking, MD   4 mg at 09/07/22 2134   Oral care mouth rinse  15 mL Mouth Rinse 4 times per day Rosalin Hawking, MD   15 mL at 09/30/22 5364   Oral care mouth rinse  15 mL Mouth Rinse PRN Rosalin Hawking, MD       pantoprazole (PROTONIX) EC tablet 40 mg  40 mg Oral BID Dimple Nanas, RPH   40 mg at 09/30/22 6803   QUEtiapine (SEROQUEL) tablet 25 mg  25 mg Oral QHS Sheikh, Georgina Quint [place], DO   25 mg at 09/29/22 2140   sevelamer carbonate (RENVELA) powder PACK 2.4 g  2.4 g Oral TID WC Sheikh, Georgina Quint [place], DO   2.4 g at 09/30/22 0856     Pt meds include: Statin :no Betablocker: No ASA: No Other anticoagulants/antiplatelets: n   Past Medical History:  Diagnosis Date   Anemia    CKD (chronic kidney disease)    on wed 12/09/21   Diabetes mellitus without complication ([place])    type 2   Hypertension    Renal disorder     Past Surgical History:  Procedure Laterality Date   AV FISTULA PLACEMENT Left 12/10/2021   Procedure: LEFT RADIAL CEPHALIC  ARTERIOVENOUS (AV) FISTULA;  Surgeon: Waynetta Sandy, MD;  Location: [address];  Service: Vascular;  Laterality: Left;   IR DIALY SHUNT INTRO [place] W/IMG LEFT Left 09/13/2022   IR FLUORO GUIDE CV LINE RIGHT  12/09/2021   IR FLUORO GUIDE CV LINE RIGHT  09/15/2022   IR US GUIDE VASC ACCESS RIGHT  12/09/2021   IR US GUIDE VASC ACCESS RIGHT  09/15/2022   LUMBAR [place] SURGERY  2021   L5-S1    Social History Social History   Tobacco Use   Smoking status: Some Days    Packs/day: 0.15    Types: Cigarettes   Smokeless tobacco: Never  Vaping Use   Vaping Use: Never used  Substance Use Topics   Alcohol use: Not Currently   Drug use: Not Currently    Family History History reviewed. No pertinent family history.  Allergies  Allergen Reactions   Gabapentin Shortness Of Breath and Rash    Scratchy throat    Metformin And Related Other (See Comments)    Affecting kidney function      REVIEW OF SYSTEMS  General: '[ ]'$  Weight loss, '[ ]'$  Fever, '[ ]'$  chills Neurologic: '[ ]'$  Dizziness, '[ ]'$  Blackouts, '[ ]'$  Seizure '[ ]'$  Stroke, '[ ]'$  "Mini stroke", '[ ]'$  Slurred speech, '[ ]'$  Temporary blindness; '[ ]'$  weakness in arms or legs, '[ ]'$  Hoarseness '[ ]'$  Dysphagia Cardiac: '[ ]'$  Chest pain/pressure, '[ ]'$  Shortness of breath at rest '[ ]'$  Shortness of breath with exertion, '[ ]'$  Atrial fibrillation or irregular heartbeat  Vascular: '[ ]'$  Pain in legs with walking, '[ ]'$  Pain in legs at rest, '[ ]'$  Pain in legs at night,  '[ ]'$  Non-healing ulcer, '[ ]'$  Blood clot in vein/DVT,   Pulmonary: '[ ]'$   Home oxygen, '[ ]'$  Productive cough, '[ ]'$  Coughing up blood, '[ ]'$  Asthma,  '[ ]'$  Wheezing '[ ]'$  COPD Musculoskeletal:  '[ ]'$   Arthritis, '[ ]'$  Low back pain, '[ ]'$  Joint pain Hematologic: '[ ]'$  Easy Bruising, '[ ]'$  Anemia; '[ ]'$  Hepatitis Gastrointestinal: '[ ]'$  Blood in stool, '[ ]'$  Gastroesophageal Reflux/heartburn, Urinary: '[ ]'$  chronic Kidney disease, '[ ]'$  on HD - '[ ]'$  MWF or '[ ]'$  TTHS, '[ ]'$  Burning with urination, '[ ]'$  Difficulty urinating Skin: '[ ]'$  Rashes, '[ ]'$  Wounds Psychological: '[ ]'$  Anxiety, '[ ]'$  Depression  Physical Examination Vitals:   09/29/22 1951 09/30/22 0026 09/30/22 0358 09/30/22 0815  BP: 135/68 138/73 139/84 114/74  Pulse: 79 75 76 75  Resp: '17 18 18 18  '$ Temp: 97.9 F (36.6 C) 98.4 F (36.9 C) 97.8 F (36.6 C) 98.5 F (36.9 C)  TempSrc: Axillary Oral Oral   SpO2: 99% 99% 96% 100%  Weight:      Height:       Body mass index is 27.55 kg/m.  General:  WDWN in NAD  HENT: WNL Eyes: Pupils equal Pulmonary: normal non-labored breathing , without Rales, rhonchi,  wheezing Cardiac: RRR, without  Murmurs, rubs or gallops; No carotid bruits Abdomen: soft, NT, no masses Skin: no rashes, ulcers noted;  no Gangrene , no cellulitis; no open wounds;   Vascular Exam/Pulses:Palpable left radial pulse and good distal 1/2 fistula thrill.  No signs of infection of ischemic skin changes.   Musculoskeletal: no muscle wasting or atrophy; no edema  Neurologic: A&O X 3; Appropriate Affect ;  SENSATION: normal; MOTOR FUNCTION: moves all 4 extremities Speech is fluent/normal   Significant Diagnostic Studies: CBC Lab Results  Component Value Date   WBC 8.7 09/29/2022   HGB 10.5 (L) 09/29/2022   HCT 33.2 (L) 09/29/2022   MCV 70.8 (L) 09/29/2022   PLT 338 09/29/2022    BMET    Component Value Date/Time   NA 131 (L) 09/29/2022 0821   K 5.2 (H) 09/29/2022 0821   CL 92 (L) 09/29/2022 0821   CO2 22 09/29/2022 0821   GLUCOSE 124 (H) 09/29/2022 0821   BUN 70 (H) 09/29/2022 0821   CREATININE 9.57  (H) 09/29/2022 0821   CREATININE 2.54 (H) 08/09/2019 0827   CALCIUM 8.9 09/29/2022 0821   GFRNONAA 6 (L) 09/29/2022 0821   GFRNONAA NOT CALCULATED 08/09/2019 0827   GFRAA NOT CALCULATED 08/09/2019 0827   Estimated Creatinine Clearance: 8.6 mL/min (A) (by C-G formula based on SCr of 9.57 mg/dL (H)).  COAG Lab Results  Component Value Date   INR 1.0 08/24/2022     Non-Invasive Vascular Imaging:  IR 09/13/22 Findings: Contrast study of left arm AVF shows patent anastomosis between radial artery and cephalic vein. Cephalic outflow is relatively poorly mature after initial segment due to very prominent competing venous outflow into superficial venous branches and deep venous system. Central veins normally patent.  ASSESSMENT/PLAN:  Poorly functioning RC AV fistula with competing branches per IR fistulagram.  currently on HD via right TDC placed by IR.  Dr. Donzetta Matters will review the fistulagram and possible intervention will be branch ligation.  The fistula is patent with palpable thrill.  Family wants aggressive care.    Roxy Horseman 09/30/2022 11:52 AM   I have independently interviewed and examined patient and agree with PA assessment and plan above.  Fistula in the left forearm is patent and imaging from interventional radiology was reviewed with multiple branches.  He will either need branch ligation and superficialization of the forearm fistula versus conversion to upper arm fistula which would be determined at the time of surgery.  At this point I would defer any surgical intervention until the outpatient setting if possible as fistula would not be readily usable after revision and would be some concern of dehiscence especially if patient requiring forearm restraints to prevent pulling at his tunneled dialysis catheter.  Will make definitive plans based on patient's disposition in the next couple days.  Tyronne Blann C. Donzetta Matters, MD [facility] Office:  [phone] Pager: [phone]

## 2022-09-30 NOTE — Progress Notes (Signed)
Mobility Specialist: Progress Note   09/30/22 1742  Mobility  Activity Ambulated with assistance in hallway  Level of Assistance Minimal assist, patient does 75% or more  Assistive Device Front wheel walker  Distance Ambulated (ft) 150 ft  Activity Response Tolerated well  Mobility Referral Yes  $Mobility charge 1 Mobility   Pt received in the bed and agreeable to mobility. Mod I with bed mobility and as well as to stand. MinA during ambulation for balance and RW management. No c/o throughout. Pt back to bed after session with call bell in reach. Bed alarm is on.   Terlton Analynn Daum Mobility Specialist Please contact via SecureChat or Rehab office at [phone]

## 2022-09-30 NOTE — Progress Notes (Signed)
[facility] Progress Note   Subjective:   Pt seen in room. Holding TV remote in hand, asks if I have the number for his lawyer. Advised to ask his family for that info. Denies SOB, CP and dizziness. Reportedly has tried to grab his TDC. AVF was poorly functional and fistulogram showed small size and competing veins- will consult vascular to see if it is salvageable.   Objective Vitals:   09/29/22 1951 09/30/22 0026 09/30/22 0358 09/30/22 0815  BP: 135/68 138/73 139/84 114/74  Pulse: 79 75 76 75  Resp: '17 18 18 18  '$ Temp: 97.9 F (36.6 C) 98.4 F (36.9 C) 97.8 F (36.6 C) 98.5 F (36.9 C)  TempSrc: Axillary Oral Oral   SpO2: 99% 99% 96% 100%  Weight:      Height:       Physical Exam General: Alert male, NAD Heart: RRR, no murmurs, rubs or gallops Lungs: CTA bilaterally without wheezing, rhonchi or rales Abdomen: Soft, non-distended, +BS Extremities: No edema b/l lower extremities Dialysis Access:  LUE forearm graft + bruit/thrill, [facility]  Additional Objective Labs: Basic Metabolic Panel: Recent Labs  Lab 09/27/22 0403 09/28/22 0341 09/29/22 0821  NA 130* 133* 131*  K 4.4 4.4 5.2*  CL 90* 94* 92*  CO2 '22 25 22  '$ GLUCOSE 83 87 124*  BUN 85* 45* 70*  CREATININE 9.65* 6.63* 9.57*  CALCIUM 8.9 9.0 8.9  PHOS 5.2* 4.1 5.5*   Liver Function Tests: Recent Labs  Lab 09/27/22 0403 09/28/22 0341 09/29/22 0821  AST '26 23 20  '$ ALT '28 25 21  '$ ALKPHOS 73 78 68  BILITOT 0.5 0.3 0.5  PROT 7.2 7.4 7.2  ALBUMIN 3.3* 3.3* 3.2*   No results for input(s): "LIPASE", "AMYLASE" in the last 168 hours. CBC: Recent Labs  Lab 09/25/22 0358 09/26/22 0415 09/27/22 0403 09/28/22 0341 09/29/22 0821  WBC 10.6* 9.7 10.3 8.7 8.7  NEUTROABS 8.1* 6.3 7.1 6.1 6.3  HGB 11.6* 10.8* 11.0* 10.8* 10.5*  HCT 34.7* 33.8* 32.6* 34.1* 33.2*  MCV 68.8* 70.0* 68.1* 70.7* 70.8*  PLT 301 319 314 330 338   Blood Culture    Component Value Date/Time   SDES TRACHEAL ASPIRATE  08/26/2022 1212   SPECREQUEST NONE 08/26/2022 1212   CULT  08/26/2022 1212    ABUNDANT STREPTOCOCCUS ANGINOSIS RARE KLEBSIELLA OXYTOCA ABUNDANT HAEMOPHILUS INFLUENZAE BETA LACTAMASE NEGATIVE Performed at [facility], [place] [address], [place], [place] [address]    REPTSTATUS 08/29/2022 FINAL 08/26/2022 1212    Cardiac Enzymes: No results for input(s): "CKTOTAL", "CKMB", "CKMBINDEX", "TROPONINI" in the last 168 hours. CBG: Recent Labs  Lab 09/29/22 1247 09/29/22 1844 09/30/22 0024 09/30/22 0357 09/30/22 0601  GLUCAP 126* 137* 120* 148* 96   Iron Studies: No results for input(s): "IRON", "TIBC", "TRANSFERRIN", "FERRITIN" in the last 72 hours. '@lablastinr3'$ @ Studies/Results: No results found. Medications:  sodium chloride Stopped (08/30/22 0033)    amLODipine  5 mg Oral BID   carvedilol  25 mg Oral BID WC   Chlorhexidine Gluconate Cloth  6 each Topical Q0600   darbepoetin (ARANESP) injection - DIALYSIS  150 mcg Subcutaneous Q Mon-1800   feeding supplement (NEPRO CARB STEADY)  237 mL Oral TID WC   heparin injection (subcutaneous)  5,000 Units Subcutaneous Q8H   hydrALAZINE  100 mg Oral Q8H   insulin aspart  0-15 Units Subcutaneous Q6H   insulin glargine-yfgn  10 Units Subcutaneous Daily   multivitamin  1 tablet Oral QHS   mouth rinse  15 mL Mouth Rinse 4 times per day   pantoprazole  40 mg Oral BID   QUEtiapine  25 mg Oral QHS   sevelamer carbonate  2.4 g Oral TID WC    Dialysis Orders: SW MWF 3h 65mn  80kg   400/800   2/2 bath  Hep 2000  LFA AVF - last HD 11/13, post 80.1kg - mircera 150 q2, last 11/13 - hectorol 438m IV q HD    Assessment/Plan: 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 S/P course of hypertonic fluids.  No heparin with HD.  AMS - due to #1. Has had sig improvement, still some confusion  HTN/ vol  - getting po BP meds x 3, BP's good, no edema on exam ESRD - pt required CRRT 11/15-11/17. Resumed MWF.  HD  access - BUN was very high so access recirc was suspected. IR did fistulogram 12/4 and AVF was found be small with competing veins. Using RIJ T[place]aguayoow placed by IR on 12/6. Will consult vascular surgery to see if AVF is salvageable Anemia of ESRD: Hgb improved to 10.5, continue Aranesp 15015mweekly while here. Secondary HPTH: CCa ok. Renvela 3 ac tid as binder. Continue VDRA.  DM2 - per pmd  SamAnice PaganiniA-C 09/30/2022, 11:39 AM  [facility] Pager: [phone]

## 2022-09-30 NOTE — Progress Notes (Signed)
Palliative Medicine Progress Note   Patient Name: Randy Ross       Date: 09/30/2022 DOB: [birth year]  Age: 51 y.o. MRN#: [id number] Attending Physician: Caren Griffins, MD Primary Care Physician: Medicine, [place] Family Admit Date: 08/24/2022    HPI/Patient Profile: 51 y.o. male  with past medical history of Anemia, ESRD on HD, diabetes, and hypertension admitted on 08/24/2022 with acute IPH with severe mass effect/brain swelling and hypertensive emergency.  Patient also with inability to protect airway due to mental status.  PMT consulted to discuss goals of care.    Per family patient goes by "Randy Ross".     Subjective: Received call from family member requesting to discuss 85 care. We review his improvements - now feeding himself. Discuss diet he is on now for his safety. Discuss mobility assessments. Discussed some intermittent agitation/encephalopathy and intermittent need for restraints. Reviewed TOC notes about potential placement.   Met briefly with Randy Ross - he has no complaints. Asks for nursing assistance to bathroom. No acute palliative needs identified.    Objective:  Physical Exam Vitals reviewed.  Constitutional:      General: He is not in acute distress.    Appearance: He is ill-appearing.  Pulmonary:     Effort: Pulmonary effort is normal.  Skin:    General: Skin is warm and dry.  Neurological:     Mental Status: He is alert.     Comments: Difficult to assess orientation as speech limited but he is able to ask for assistance to bathroom             Vital Signs: BP 137/63 (BP Location: Right Arm)   Pulse 78   Temp 98.6 F (37 C)   Resp 18   Ht _0  (1.651 m)   Wt 75.1 kg   SpO2 98%   BMI 27.55 kg/m  SpO2: SpO2: 98 % O2  Device: O2 Device: Room Air      Palliative Medicine Assessment & Plan   Assessment: Principal Problem:   Acute cerebellar hemorrhage (HCC) Active Problems:   Anemia   ESRD (end stage renal disease) (HCC)   Diabetes mellitus (HCC)   Acute respiratory failure with hypoxia (HCC)   Hypertensive emergency   Pressure injury of skin   Acute metabolic encephalopathy and delirium   Cerebral edema (  [place])   Acute embolic stroke ([place])   Lower GI bleeding   Dysphagia   Palliative care by specialist    Recommendations/Plan: Full scope care Family updated and awaiting placement No acute palliative needs identified, please call/epic chat with any needs   Code Status: Full code   Prognosis:  Unable to determine  Discharge Planning: To Be Determined    Thank you for allowing the Palliative Medicine Team to assist in the care of this patient.  Juel Burrow, DNP, AGNP-C Palliative Medicine Team Team Phone # [phone]  Pager # [phone]    Please contact Palliative Medicine Team phone at [phone] for questions and concerns.  For individual providers, please see AMION.

## 2022-09-30 NOTE — Progress Notes (Signed)
Refusing to take meds at this time.

## 2022-09-30 NOTE — Progress Notes (Signed)
Physical Therapy Treatment Patient Details Name: Randy Ross MRN: [id number] DOB: 03-24-1971 Today's Date: 09/30/2022   History of Present Illness 51 y.o. male presented 08/24/22 with slurred speech and headache. 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 Pt able to complete bed mobility, transfers sit<>stand and gait with RW with min assist, cues needed throughout for safety and navigation. Pt willing to attempt short bout (15') of gait without AD with HHA x1 with noted significant decrease ins stability with pt reaching for anything to hold on R and pt asking for RW use. Pt continues to be limited by fatigue, weakness and decreased balance/postural reactions. Current plan remains appropriate to address deficits and maximize functional independence and decrease caregiver burden. Pt continues to benefit from skilled PT services to progress toward functional mobility goals.     Recommendations for follow up therapy are one component of a multi-disciplinary discharge planning process, led by the attending physician.  Recommendations may be updated based on patient status, additional functional criteria and insurance authorization.  Follow Up Recommendations  Acute inpatient rehab (3hours/day) Can patient physically be transported by private vehicle: Yes   Assistance Recommended at Discharge Frequent or constant Supervision/Assistance  Patient can return home with the following Assistance with cooking/housework;Assistance with feeding;Direct supervision/assist for medications management;Direct supervision/assist for financial management;Assist for transportation;Help with stairs or ramp for entrance;A lot of help with walking and/or transfers;A lot  of help with bathing/dressing/bathroom   Equipment Recommendations  None recommended by PT    Recommendations for Other Services       Precautions / Restrictions Precautions Precautions: Fall;Other (comment) Precaution Comments: pulled out multiple cortraks, bilateral restraints Restrictions Weight Bearing Restrictions: No     Mobility  Bed Mobility Overal bed mobility: Needs Assistance Bed Mobility: Supine to Sit, Sit to Supine     Supine to sit: Min guard, HOB elevated Sit to supine: Min guard   General bed mobility comments: min guard for safety    Transfers Overall transfer level: Needs assistance Equipment used: Rolling walker (2 wheels) Transfers: Sit to/from Stand Sit to Stand: Min guard           General transfer comment: min guard for safety, cues for hand placement    Ambulation/Gait Ambulation/Gait assistance: Min assist Gait Distance (Feet): 100 Feet Assistive device: Rolling walker (2 wheels) Gait Pattern/deviations: Step-through pattern, Decreased stride length, Trunk flexed Gait velocity: decr     General Gait Details: pt pushing RW too far ahead, but would make corrections when cued verbally; assist during turn for maneuvering RW and for balance   Stairs             Wheelchair Mobility    Modified Rankin (Stroke Patients Only) Modified Rankin (Stroke Patients Only) Pre-Morbid Rankin Score: No symptoms Modified Rankin: Moderately severe disability     Balance Overall balance assessment: Needs assistance Sitting-balance support: No upper extremity supported, Feet unsupported Sitting balance-Leahy Scale: Fair Sitting balance - Comments: Min guard A for safety   Standing balance support: Single extremity supported, During functional activity Standing balance-Leahy Scale: Poor Standing balance comment: reliant on at least one UE support                            Cognition Arousal/Alertness:  Awake/alert Behavior  During Therapy: Flat affect Overall Cognitive Status: Impaired/Different from baseline Area of Impairment: Attention, Following commands, Safety/judgement, Awareness, Problem solving                   Current Attention Level: Sustained   Following Commands: Follows one step commands with increased time Safety/Judgement: Decreased awareness of safety, Decreased awareness of deficits Awareness: Intellectual Problem Solving: Slow processing, Difficulty sequencing, Requires verbal cues General Comments: pt cooperative during session, following all commands with + time. decreased awareness into deficits often running into obstacles on L side        Exercises Other Exercises Other Exercises: STS x10    General Comments General comments (skin integrity, edema, etc.): VSSon RA      Pertinent Vitals/Pain Pain Assessment Pain Assessment: No/denies pain Pain Intervention(s): Monitored during session    Home Living                          Prior Function            PT Goals (current goals can now be found in the care plan section) Acute Rehab PT Goals Patient Stated Goal: unable PT Goal Formulation: With patient Time For Goal Achievement: 10/11/22 Progress towards PT goals: Progressing toward goals    Frequency    Min 3X/week      PT Plan      Co-evaluation              AM-PAC PT "6 Clicks" Mobility   Outcome Measure  Help needed turning from your back to your side while in a flat bed without using bedrails?: A Little Help needed moving from lying on your back to sitting on the side of a flat bed without using bedrails?: A Little Help needed moving to and from a bed to a chair (including a wheelchair)?: A Lot Help needed standing up from a chair using your arms (e.g., wheelchair or bedside chair)?: A Lot Help needed to walk in hospital room?: A Lot Help needed climbing 3-5 steps with a railing? : Total 6 Click Score: 13    End of Session  Equipment Utilized During Treatment: Gait belt Activity Tolerance: Patient tolerated treatment well;Patient limited by fatigue Patient left: in bed;with bed alarm set;with call bell/phone within reach Nurse Communication: Mobility status PT Visit Diagnosis: Other abnormalities of gait and mobility (R26.89);Other symptoms and signs involving the nervous system (R29.898)     Time: 1105-1120 PT Time Calculation (min) (ACUTE ONLY): 15 min  Charges:  $Gait Training: 8-22 mins                     Rithvik Orcutt R. PTA Acute Rehabilitation Services Office: [place] 09/30/2022, 11:26 AM

## 2022-09-30 NOTE — Progress Notes (Signed)
Occupational Therapy Treatment Patient Details Name: Randy Ross MRN: [id number] DOB: 1971/08/10 Today's Date: 09/30/2022   History of present illness 51 y.o. male presented 08/24/22 with slurred speech and headache. Head CT Rt cerebellar hemorrhage; intubated 11/14, self-extubated 11/30; CRRT; MRI brain 11/21 many small acute infarcts; PMH significant for end-stage renal disease on intermittent hemodialysis, diabetes, hypertension   OT comments  Pt progressing gradually towards OT goals. Pt able to mobilize in room for ADLs using RW with min guard to Min A (when turning). Pt able to manage bimanual tasks standing at sink with min guard for balance. Difficult to fully assess cognition due to flat affect and inconsistent responses to questions/directions. Rec AIR therapies pending improved pt engagement and family support at DC.   Recommendations for follow up therapy are one component of a multi-disciplinary discharge planning process, led by the attending physician.  Recommendations may be updated based on patient status, additional functional criteria and insurance authorization.    Follow Up Recommendations  Acute inpatient rehab (3hours/day)     Assistance Recommended at Discharge Frequent or constant Supervision/Assistance  Patient can return home with the following  A little help with walking and/or transfers;A little help with bathing/dressing/bathroom;Assistance with feeding;Direct supervision/assist for medications management;Direct supervision/assist for financial management;Assist for transportation;Help with stairs or ramp for entrance   Equipment Recommendations  Other (comment) (TBD pending progress)    Recommendations for Other Services      Precautions / Restrictions Precautions Precautions: Fall;Other (comment) Precaution Comments: pulled out multiple cortraks Restrictions Weight Bearing Restrictions: No       Mobility Bed Mobility Overal bed mobility:  Needs Assistance Bed Mobility: Supine to Sit, Sit to Supine     Supine to sit: Supervision, HOB elevated Sit to supine: Supervision        Transfers Overall transfer level: Needs assistance Equipment used: Rolling walker (2 wheels) Transfers: Sit to/from Stand Sit to Stand: Min guard           General transfer comment: min guard for safety, cues for hand placement     Balance Overall balance assessment: Needs assistance Sitting-balance support: No upper extremity supported, Feet unsupported Sitting balance-Leahy Scale: Fair     Standing balance support: Single extremity supported, During functional activity Standing balance-Leahy Scale: Fair Standing balance comment: able to stand statically without assist, BUE using RW needed for mobility                           ADL either performed or assessed with clinical judgement   ADL Overall ADL's : Needs assistance/impaired     Grooming: Min guard;Standing;Oral care Grooming Details (indicate cue type and reason): able to place toothpaste on toothbrush, manage toothpaste tube cap, turn on water when needed. minor unsteadiness as pt crossing LEs in standing statically at sink             Lower Body Dressing: Minimal assistance;Sit to/from stand Lower Body Dressing Details (indicate cue type and reason): able to pull up pants over waist, don R sock bed level. mother present and donned L sock (unable to redirect from this with language barrier)             Functional mobility during ADLs: Min guard;Minimal assistance;Cueing for sequencing;Cueing for safety;Rolling walker (2 wheels)      Extremity/Trunk Assessment Upper Extremity Assessment Upper Extremity Assessment: RUE deficits/detail;LUE deficits/detail RUE Deficits / Details: 3+/5 grip strength, decreasd coordination LUE Deficits / Details: 3+/5 grip strength,  decreasd coordination   Lower Extremity Assessment Lower Extremity Assessment: Defer to  PT evaluation        Vision   Vision Assessment?: Vision impaired- to be further tested in functional context Additional Comments: appears to have some L inattention, difficult to fully assess   Perception     Praxis      Cognition Arousal/Alertness: Awake/alert Behavior During Therapy: Flat affect Overall Cognitive Status: Difficult to assess Area of Impairment: Attention, Following commands, Safety/judgement, Awareness, Problem solving                   Current Attention Level: Sustained   Following Commands: Follows one step commands with increased time Safety/Judgement: Decreased awareness of safety, Decreased awareness of deficits Awareness: Intellectual Problem Solving: Slow processing, Difficulty sequencing, Requires verbal cues General Comments: difficult to fully assess as pt not answering questions, responding to therapist during session. Follows one step commands with increased time.        Exercises      Shoulder Instructions       General Comments mother present    Pertinent Vitals/ Pain       Pain Assessment Pain Assessment: No/denies pain  Home Living                                          Prior Functioning/Environment              Frequency  Min 2X/week        Progress Toward Goals  OT Goals(current goals can now be found in the care plan section)  Progress towards OT goals: Progressing toward goals  Acute Rehab OT Goals Patient Stated Goal: none stated OT Goal Formulation: With patient Time For Goal Achievement: 10/09/22 Potential to Achieve Goals: Fair ADL Goals Pt Will Perform Grooming: with supervision;standing Pt Will Perform Lower Body Dressing: with min guard assist;sit to/from stand Pt Will Transfer to Toilet: with min guard assist;ambulating  Plan Discharge plan remains appropriate;Frequency remains appropriate    Co-evaluation                 AM-PAC OT "6 Clicks" Daily Activity      Outcome Measure   Help from another person eating meals?: A Little Help from another person taking care of personal grooming?: A Little Help from another person toileting, which includes using toliet, bedpan, or urinal?: A Lot Help from another person bathing (including washing, rinsing, drying)?: A Lot Help from another person to put on and taking off regular upper body clothing?: A Little Help from another person to put on and taking off regular lower body clothing?: A Little 6 Click Score: 16    End of Session Equipment Utilized During Treatment: Gait belt;Rolling walker (2 wheels)  OT Visit Diagnosis: Unsteadiness on feet (R26.81);Muscle weakness (generalized) (M62.81);Other abnormalities of gait and mobility (R26.89);Other symptoms and signs involving cognitive function;Cognitive communication deficit (R41.841) Symptoms and signs involving cognitive functions: Nontraumatic SAH   Activity Tolerance Patient tolerated treatment well   Patient Left in bed;with call bell/phone within reach;with bed alarm set;with family/visitor present   Nurse Communication Mobility status        Time: 1356-1411 OT Time Calculation (min): 15 min  Charges: OT General Charges $OT Visit: 1 Visit OT Treatments $Self Care/Home Management : 8-22 mins  Malachy Chamber, OTR/L Acute Rehab Services Office: [phone]   Layla Maw 09/30/2022,  2:25 PM

## 2022-10-01 ENCOUNTER — Other Ambulatory Visit: Payer: Self-pay

## 2022-10-01 LAB — COMPREHENSIVE METABOLIC PANEL
ALT: 18 U/L (ref 0–44)
AST: 17 U/L (ref 15–41)
Albumin: 3 g/dL — ABNORMAL LOW (ref 3.5–5.0)
Alkaline Phosphatase: 64 U/L (ref 38–126)
Anion gap: 13 (ref 5–15)
BUN: 60 mg/dL — ABNORMAL HIGH (ref 6–20)
CO2: 23 mmol/L (ref 22–32)
Calcium: 9 mg/dL (ref 8.9–10.3)
Chloride: 97 mmol/L — ABNORMAL LOW (ref 98–111)
Creatinine, Ser: 9.53 mg/dL — ABNORMAL HIGH (ref 0.61–1.24)
GFR, Estimated: 6 mL/min — ABNORMAL LOW (ref >60)
Glucose, Bld: 85 mg/dL (ref 70–99)
Potassium: 5.5 mmol/L — ABNORMAL HIGH (ref 3.5–5.1)
Sodium: 133 mmol/L — ABNORMAL LOW (ref 135–145)
Total Bilirubin: 0.3 mg/dL (ref 0.3–1.2)
Total Protein: 6.7 g/dL (ref 6.5–8.1)

## 2022-10-01 LAB — GLUCOSE, CAPILLARY
Glucose-Capillary: 105 mg/dL — ABNORMAL HIGH (ref 70–99)
Glucose-Capillary: 107 mg/dL — ABNORMAL HIGH (ref 70–99)
Glucose-Capillary: 128 mg/dL — ABNORMAL HIGH (ref 70–99)
Glucose-Capillary: 186 mg/dL — ABNORMAL HIGH (ref 70–99)
Glucose-Capillary: 95 mg/dL (ref 70–99)

## 2022-10-01 LAB — CBC
HCT: 30.5 % — ABNORMAL LOW (ref 39.0–52.0)
Hemoglobin: 10.1 g/dL — ABNORMAL LOW (ref 13.0–17.0)
MCH: 23.1 pg — ABNORMAL LOW (ref 26.0–34.0)
MCHC: 33.1 g/dL (ref 30.0–36.0)
MCV: 69.6 fL — ABNORMAL LOW (ref 80.0–100.0)
Platelets: 295 10*3/uL (ref 150–400)
RBC: 4.38 MIL/uL (ref 4.22–5.81)
RDW: 18.9 % — ABNORMAL HIGH (ref 11.5–15.5)
WBC: 9.2 10*3/uL (ref 4.0–10.5)
nRBC: 0 % (ref 0.0–0.2)

## 2022-10-01 LAB — MAGNESIUM: Magnesium: 1.9 mg/dL (ref 1.7–2.4)

## 2022-10-01 MED ORDER — HEPARIN SODIUM (PORCINE) 1000 UNIT/ML IJ SOLN
INTRAMUSCULAR | Status: AC
  Administered 2022-10-01: 3200 [IU]
  Filled 2022-10-01: qty 4

## 2022-10-01 MED ORDER — HYDRALAZINE HCL 100 MG PO TABS: 100.0000 mg | ORAL_TABLET | Freq: Three times a day (TID) | ORAL | Status: DC

## 2022-10-01 MED ORDER — QUETIAPINE FUMARATE 25 MG PO TABS: 25.0000 mg | ORAL_TABLET | Freq: Every day | ORAL | Status: DC

## 2022-10-01 NOTE — Plan of Care (Signed)
  Problem: Activity: Goal: Ability to tolerate increased activity will improve Outcome: Progressing   Problem: Respiratory: Goal: Ability to maintain a clear airway and adequate ventilation will improve Outcome: Progressing   Problem: Role Relationship: Goal: Method of communication will improve Outcome: Progressing   Problem: Education: Goal: Ability to describe self-care measures that may prevent or decrease complications (Diabetes Survival Skills Education) will improve Outcome: Progressing Goal: Individualized Educational Video(s) Outcome: Progressing   Problem: Coping: Goal: Ability to adjust to condition or change in health will improve Outcome: Progressing   Problem: Fluid Volume: Goal: Ability to maintain a balanced intake and output will improve Outcome: Progressing   Problem: Health Behavior/Discharge Planning: Goal: Ability to identify and utilize available resources and services will improve Outcome: Progressing Goal: Ability to manage health-related needs will improve Outcome: Progressing   Problem: Metabolic: Goal: Ability to maintain appropriate glucose levels will improve Outcome: Progressing   Problem: Nutritional: Goal: Maintenance of adequate nutrition will improve Outcome: Progressing Goal: Progress toward achieving an optimal weight will improve Outcome: Progressing   Problem: Skin Integrity: Goal: Risk for impaired skin integrity will decrease Outcome: Progressing   Problem: Tissue Perfusion: Goal: Adequacy of tissue perfusion will improve Outcome: Progressing   Problem: Safety: Goal: Non-violent Restraint(s) Outcome: Progressing   Problem: Education: Goal: Knowledge of General Education information will improve Description: Including pain rating scale, medication(s)/side effects and non-pharmacologic comfort measures Outcome: Progressing   Problem: Health Behavior/Discharge Planning: Goal: Ability to manage health-related needs will  improve Outcome: Progressing   Problem: Clinical Measurements: Goal: Ability to maintain clinical measurements within normal limits will improve Outcome: Progressing Goal: Will remain free from infection Outcome: Progressing Goal: Diagnostic test results will improve Outcome: Progressing Goal: Respiratory complications will improve Outcome: Progressing Goal: Cardiovascular complication will be avoided Outcome: Progressing   Problem: Activity: Goal: Risk for activity intolerance will decrease Outcome: Progressing   Problem: Nutrition: Goal: Adequate nutrition will be maintained Outcome: Progressing   Problem: Coping: Goal: Level of anxiety will decrease Outcome: Progressing   Problem: Elimination: Goal: Will not experience complications related to bowel motility Outcome: Progressing Goal: Will not experience complications related to urinary retention Outcome: Progressing   Problem: Pain Managment: Goal: General experience of comfort will improve Outcome: Progressing   Problem: Safety: Goal: Ability to remain free from injury will improve Outcome: Progressing   Problem: Skin Integrity: Goal: Risk for impaired skin integrity will decrease Outcome: Progressing   Problem: Education: Goal: Knowledge of disease or condition will improve Outcome: Progressing Goal: Knowledge of secondary prevention will improve (MUST DOCUMENT ALL) Outcome: Progressing Goal: Knowledge of patient specific risk factors will improve Elta [place] N/A or DELETE if not current risk factor) Outcome: Progressing

## 2022-10-01 NOTE — Progress Notes (Signed)
Mobility Specialist: Progress Note   10/01/22 1408  Mobility  Activity Ambulated with assistance in hallway  Level of Assistance Minimal assist, patient does 75% or more  Assistive Device Front wheel walker  Distance Ambulated (ft) 150 ft  Activity Response Tolerated well  Mobility Referral Yes  $Mobility charge 1 Mobility   Pt received in the bed and agreeable to mobility. Mod I with bed mobility and standing. MinA for RW management. No c/o throughout. Pt back to bed after session with call bell at his side. Bed alarm is on.   [place] Shamon Cothran Mobility Specialist Please contact via SecureChat or Rehab office at [phone]

## 2022-10-01 NOTE — Progress Notes (Signed)
Received patient in bed to unit.  Alert and oriented.  Informed consent signed and in chart.   Treatment initiated: 7282 Treatment completed: 1200  Patient tolerated well. Dialysis circulation clotted off x2. Transported back to the room  Alert, without acute distress.  Hand-off given to patient's nurse.   Access used: Catheter Access issues: Lines reversed.  Total UF removed: 1300 Medication(s) given: none Post HD VS: 151/70,75,18,100%,97.9 Post HD weight: 73.8kg   [facility]

## 2022-10-01 NOTE — Progress Notes (Signed)
PROGRESS NOTE  Randy Ross OYD:[id number] DOB: 08/08/1971 DOA: 08/24/2022 PCP: Medicine, [facility]   LOS: 38 days   Brief Narrative / Interim history: Randy Ross is a 51 y.o. male with past medical history significant for ESRD on HD, DM2, essential hypertension who presented to [facility] ED with sudden onset slurred speech, headache.  Workup in the ER with elevated BP of 260/140 and CT head showing cerebellar hemorrhage.  Patient was initially intubated in the ED for airway protection and admitted to the PCCM/neurology service.   Significant events: 11/14: Admitted for acute cerebellar hemorrhage; intubated in the ER, started on hypertonic saline 11/15: Neurosurgery were consulted, recommended against surgical evacuation 11/16: Cefepime started for suspected aspiration 11/17: Required 1u PRBC transfusion due to CRRT filter 11/19: EEG nonspecific 11/21: MRI brain shows new scattered bilateral acute infarcts 11/22: Completed 7 days antibiotics 11/24: IR consulted given recirculation syndrome, high BUN; plan for fistulogram pending 11/27: Temp cath HD cath placed  11/30: Self-extubated 12/1: Persistent delirium 12/2: Transferred out of ICU to [facility] service 12/3: Pulled out cortrak 12/4: Failed SLP, needs cortrak, fistulogram shows poor outflow 12/5: TDC placed, feeding tube replaced 12/6: HD catheter placed by IR 12/9: Family meeting with palliative care, family to decide on possible transitioning to comfort measures  12/11: Family decided to continue aggressive measures, IR consulted for PEG tube 12/12: SLP requesting MBS prior to PEG tube placement; MBS likely plan for 12/12 12/13: MBS done and recommending dysphagia 1 diet with honey thick liquids  Subjective / 24h Interval events: He is without plaints this morning.  Assesement and Plan: Principal Problem:   Acute cerebellar hemorrhage (HCC) Active Problems:   Anemia   ESRD (end stage renal  disease) ([place])   Diabetes mellitus ([place])   Acute respiratory failure with hypoxia (HCC)   Hypertensive emergency   Pressure injury of skin   Acute metabolic encephalopathy and delirium   Cerebral edema (HCC)   Acute embolic stroke ([place])   Lower GI bleeding   Dysphagia   Palliative care by specialist   Principal problem Acute Cerebellar hemorrhage 2/2 poorly controlled hypertension, cerebral edema -Patient presenting to ED via EMS with mental status change, left-sided weakness, slurred speech and left-sided gaze preference.  He was severely hypertensive on admission, was placed in the ICU and was intubated for airway protection, 1 was placed on Cleviprex infusion. CT scan was notable for IPH right cerebellar hemisphere with mass effect.  He eventually improved, neurology signed off, and was transferred to the hospitalist service.  Neurologically he has remained stable and he is stable for discharge. -Awaiting insurance authorization for inpatient rehab in [facility], after that is obtained he will need HD spot approval in [place].  SW involved  Active problems  Dysphagia, improving -initially requiring a core track, there were plans for PEG tube but he is improving, eating on his own, now does not need a PEG nor has the core track anymore.  Able to eat on his own  Lower GI Bleeding -Noted by nurse, transient, no change in hemoglobin.  No further bleeding reported.  Monitor hemoglobin, overall stable, 10.1 this morning   Acute Embolic CVA -MR brain 67/20 with many new acute infarcts throughout the bilateral frontal and parietal white matter consistent with embolism. TTE with LVEF 50-55%.  LDL 91, hemoglobin A1c 5.9. Holding antiplatelets due to cerebellar hemorrhage. Neurology now signed off; and do not recommend further evaluation for cardiac source of embolism as he is not a good  long-term anticoagulation candidate.   Hypertensive Emergency -BP was severely elevated on admission, likely  leading to acute cerebellar hemorrhage.  Currently on amlodipine, Coreg, hydralazine.  Blood pressure within normal parameters  Acute Respiratory Failure with Hypoxia - Resolved  Aspiration Pneumonia -completed antibiotics   Acute Metabolic Encephalopathy -better, but remains impulsive at times, trying to pull out his tunneled HD cath and requiring wrist restraint   Type 2 Diabetes Mellitus-Hemoglobin A1c 5.9 08/25/2022, well-controlled.   ESRD on HD, hyperkalemia-HD today.  Vascular consulted that he needs a fistula revision, this could be done as an outpatient.  For now he is having HD via  tunneled cath   Anemia of Chronic Medical/renal disease -Hemoglobin stable, continue intermittent monitoring.    Hyponatremia -mild, monitor    Clotted Fistula -Fistulogram showed poor outflow from fistula, nephrology consulted IR for HD catheter placement which was performed on 12/6.    Scheduled Meds:  amLODipine  5 mg Oral BID   carvedilol  25 mg Oral BID WC   Chlorhexidine Gluconate Cloth  6 each Topical Q0600   Chlorhexidine Gluconate Cloth  6 each Topical Q0600   darbepoetin (ARANESP) injection - DIALYSIS  150 mcg Subcutaneous Q Mon-1800   feeding supplement (NEPRO CARB STEADY)  237 mL Oral TID WC   heparin injection (subcutaneous)  5,000 Units Subcutaneous Q8H   hydrALAZINE  100 mg Oral Q8H   insulin aspart  0-15 Units Subcutaneous Q6H   insulin glargine-yfgn  10 Units Subcutaneous Daily   multivitamin  1 tablet Oral QHS   mouth rinse  15 mL Mouth Rinse 4 times per day   pantoprazole  40 mg Oral BID   QUEtiapine  25 mg Oral QHS   sevelamer carbonate  2.4 g Oral TID WC   Continuous Infusions:  sodium chloride Stopped (08/30/22 0033)   PRN Meds:.sodium chloride, acetaminophen (TYLENOL) oral liquid 160 mg/5 mL, bisacodyl, fentaNYL (SUBLIMAZE) injection, hydrALAZINE, hydrOXYzine, labetalol, LORazepam, ondansetron (ZOFRAN) IV, mouth rinse  Current Outpatient Medications  Medication  Instructions   acetaminophen (TYLENOL) 650 mg, Oral, Every 6 hours PRN   amLODipine (NORVASC) 10 mg, Oral, Daily at bedtime   calcitRIOL (ROCALTROL) 0.25 mcg, Oral, Daily   calcium acetate (PHOSLO) 1,334 mg, Oral, 3 times daily   carvedilol (COREG) 25 mg, Oral, Daily   Emollient (CETAPHIL) cream 1 application , Topical, Daily PRN   hydrALAZINE (APRESOLINE) 100 mg, Oral, Every 8 hours   isosorbide mononitrate (IMDUR) 30 mg, Oral, Daily at bedtime   losartan (COZAAR) 100 mg, Oral, Daily   multivitamin (RENA-VIT) TABS tablet 1 tablet, Oral, Daily at bedtime   PRESCRIPTION MEDICATION 10 Units, Subcutaneous, Daily, [place]   QUEtiapine (SEROQUEL) 25 mg, Oral, Daily at bedtime   Semglee (yfgn) 10 Units, Subcutaneous, Daily   tetrahydrozoline-zinc (VISINE-AC) 0.05-0.25 % ophthalmic solution 1 drop, Both Eyes, 3 times daily PRN    Diet Orders (From admission, onward)     Start     Ordered   09/30/22 0930  Diet regular Room service appropriate? Yes with Assist; Fluid consistency: Thin  Diet effective now       Question Answer Comment  Room service appropriate? Yes with Assist   Fluid consistency: Thin      09/30/22 0930            DVT prophylaxis: heparin injection 5,000 Units Start: 09/12/22 1445 Place and maintain sequential compression device Start: 08/25/22 1025   Lab Results  Component Value Date   PLT 295 10/01/2022  Code Status: Full Code  Family Communication: no family at bedside   Status is: Inpatient  Remains inpatient appropriate because: needs placement  Level of care: Telemetry Medical  Consultants:  Neurology Nephrology   Objective: Vitals:   10/01/22 0900 10/01/22 0930 10/01/22 1030 10/01/22 1100  BP: 134/76 135/74 104/85 (!) 142/84  Pulse: 70 76 72 75  Resp: 15 18    Temp:      TempSrc:      SpO2: 96% 98% 98% 99%  Weight:      Height:        Intake/Output Summary (Last 24 hours) at 10/01/2022 1146 Last data filed at 10/01/2022  0400 Gross per 24 hour  Intake 150 ml  Output --  Net 150 ml    Wt Readings from Last 3 Encounters:  09/29/22 75.1 kg  04/19/22 80.3 kg  03/10/22 81.3 kg    Examination:  Constitutional: NAD Eyes: lids and conjunctivae normal, no scleral icterus ENMT: mmm Neck: normal, supple Respiratory: clear to auscultation bilaterally, no wheezing, no crackles. Normal respiratory effort.  Cardiovascular: Regular rate and rhythm, no murmurs / rubs / gallops Abdomen: soft, no distention, no tenderness. Bowel sounds positive.  Skin: no rashes Neurologic: no focal deficits, equal strength   Data Reviewed: I have independently reviewed following labs and imaging studies   CBC Recent Labs  Lab 09/25/22 0358 09/26/22 0415 09/27/22 0403 09/28/22 0341 09/29/22 0821 10/01/22 0343  WBC 10.6* 9.7 10.3 8.7 8.7 9.2  HGB 11.6* 10.8* 11.0* 10.8* 10.5* 10.1*  HCT 34.7* 33.8* 32.6* 34.1* 33.2* 30.5*  PLT 301 319 314 330 338 295  MCV 68.8* 70.0* 68.1* 70.7* 70.8* 69.6*  MCH 23.0* 22.4* 23.0* 22.4* 22.4* 23.1*  MCHC 33.4 32.0 33.7 31.7 31.6 33.1  RDW 19.7* 19.8* 19.6* 19.4* 19.3* 18.9*  LYMPHSABS 1.0 1.7 1.4 1.2 1.2  --   MONOABS 1.3* 1.5* 1.3* 1.2* 0.9  --   EOSABS 0.0 0.2 0.3 0.2 0.3  --   BASOSABS 0.0 0.1 0.1 0.1 0.1  --      Recent Labs  Lab 09/26/22 0415 09/27/22 0403 09/28/22 0341 09/29/22 0821 10/01/22 0343  NA 131* 130* 133* 131* 133*  K 3.7 4.4 4.4 5.2* 5.5*  CL 92* 90* 94* 92* 97*  CO2 '25 22 25 22 23  '$ GLUCOSE 57* 83 87 124* 85  BUN 64* 85* 45* 70* 60*  CREATININE 7.62* 9.65* 6.63* 9.57* 9.53*  CALCIUM 9.0 8.9 9.0 8.9 9.0  AST 36 '26 23 20 17  '$ ALT 37 '28 25 21 18  '$ ALKPHOS 81 73 78 68 64  BILITOT 0.5 0.5 0.3 0.5 0.3  ALBUMIN 3.4* 3.3* 3.3* 3.2* 3.0*  MG 2.0 2.0 2.0 2.0 1.9     ------------------------------------------------------------------------------------------------------------------ Recent Labs    09/29/22 0821  TRIG 66     Lab Results  Component Value  Date   HGBA1C 5.9 (H) 08/25/2022   ------------------------------------------------------------------------------------------------------------------ No results for input(s): "TSH", "T4TOTAL", "T3FREE", "THYROIDAB" in the last 72 hours.  Invalid input(s): "FREET3"  Cardiac Enzymes No results for input(s): "CKMB", "TROPONINI", "MYOGLOBIN" in the last 168 hours.  Invalid input(s): "CK" ------------------------------------------------------------------------------------------------------------------ No results found for: "BNP"  CBG: Recent Labs  Lab 09/30/22 1218 09/30/22 1819 09/30/22 2355 10/01/22 0529 10/01/22 1141  GLUCAP 122* 201* 140* 105* 95     No results found for this or any previous visit (from the past 240 hour(s)).   Radiology Studies: No results found.   Marzetta Board, MD, PhD [facility]  Between 7 am - 7 pm I am available, please contact me via Amion (for emergencies) or Securechat (non urgent messages)  Between 7 pm - 7 am I am not available, please contact night coverage MD/APP via Amion

## 2022-10-01 NOTE — Progress Notes (Signed)
Back from HD

## 2022-10-01 NOTE — Progress Notes (Signed)
Leaving for HD

## 2022-10-01 NOTE — Procedures (Signed)
I was present at this dialysis session. I have reviewed the session itself and made appropriate changes.   Vital signs in last 24 hours:  Temp:  [98.1 F (36.7 C)-98.6 F (37 C)] 98.1 F (36.7 C) (12/22 0730) Pulse Rate:  [67-84] 71 (12/22 0800) Resp:  [14-22] 22 (12/22 0800) BP: (123-157)/(63-90) 157/73 (12/22 0800) SpO2:  [98 %-100 %] 99 % (12/22 0800) Weight change:  Filed Weights   09/27/22 1209 09/29/22 0811 09/29/22 1224  Weight: 74.9 kg 77 kg 75.1 kg    Recent Labs  Lab 09/29/22 0821 10/01/22 0343  NA 131* 133*  K 5.2* 5.5*  CL 92* 97*  CO2 22 23  GLUCOSE 124* 85  BUN 70* 60*  CREATININE 9.57* 9.53*  CALCIUM 8.9 9.0  PHOS 5.5*  --     Recent Labs  Lab 09/27/22 0403 09/28/22 0341 09/29/22 0821 10/01/22 0343  WBC 10.3 8.7 8.7 9.2  NEUTROABS 7.1 6.1 6.3  --   HGB 11.0* 10.8* 10.5* 10.1*  HCT 32.6* 34.1* 33.2* 30.5*  MCV 68.1* 70.7* 70.8* 69.6*  PLT 314 330 338 295    Scheduled Meds:  amLODipine  5 mg Oral BID   carvedilol  25 mg Oral BID WC   Chlorhexidine Gluconate Cloth  6 each Topical Q0600   Chlorhexidine Gluconate Cloth  6 each Topical Q0600   darbepoetin (ARANESP) injection - DIALYSIS  150 mcg Subcutaneous Q Mon-1800   feeding supplement (NEPRO CARB STEADY)  237 mL Oral TID WC   heparin injection (subcutaneous)  5,000 Units Subcutaneous Q8H   hydrALAZINE  100 mg Oral Q8H   insulin aspart  0-15 Units Subcutaneous Q6H   insulin glargine-yfgn  10 Units Subcutaneous Daily   multivitamin  1 tablet Oral QHS   mouth rinse  15 mL Mouth Rinse 4 times per day   pantoprazole  40 mg Oral BID   QUEtiapine  25 mg Oral QHS   sevelamer carbonate  2.4 g Oral TID WC   Continuous Infusions:  sodium chloride Stopped (08/30/22 0033)   PRN Meds:.sodium chloride, acetaminophen (TYLENOL) oral liquid 160 mg/5 mL, bisacodyl, fentaNYL (SUBLIMAZE) injection, hydrALAZINE, hydrOXYzine, labetalol, LORazepam, ondansetron (ZOFRAN) IV, mouth rinse   Donetta Potts,   MD 10/01/2022, 8:30 AM

## 2022-10-01 NOTE — Progress Notes (Signed)
Spoke to [employer] at [employer] this morning. Additional clinicals requested and faxed to [name] this morning for review. If insurance approves rehab, then will await approval by [facility] for an HD clinic in [place].   [name] Renal Navigator [phone]

## 2022-10-02 LAB — BASIC METABOLIC PANEL
Anion gap: 9 (ref 5–15)
BUN: 36 mg/dL — ABNORMAL HIGH (ref 6–20)
CO2: 26 mmol/L (ref 22–32)
Calcium: 8.5 mg/dL — ABNORMAL LOW (ref 8.9–10.3)
Chloride: 96 mmol/L — ABNORMAL LOW (ref 98–111)
Creatinine, Ser: 6.76 mg/dL — ABNORMAL HIGH (ref 0.61–1.24)
GFR, Estimated: 9 mL/min — ABNORMAL LOW (ref >60)
Glucose, Bld: 175 mg/dL — ABNORMAL HIGH (ref 70–99)
Potassium: 4.6 mmol/L (ref 3.5–5.1)
Sodium: 131 mmol/L — ABNORMAL LOW (ref 135–145)

## 2022-10-02 LAB — GLUCOSE, CAPILLARY
Glucose-Capillary: 203 mg/dL — ABNORMAL HIGH (ref 70–99)
Glucose-Capillary: 123 mg/dL — ABNORMAL HIGH (ref 70–99)
Glucose-Capillary: 146 mg/dL — ABNORMAL HIGH (ref 70–99)

## 2022-10-02 LAB — TRIGLYCERIDES: Triglycerides: 79 mg/dL (ref <150)

## 2022-10-02 MED ORDER — SEVELAMER CARBONATE 800 MG PO TABS
800.0000 mg | ORAL_TABLET | Freq: Three times a day (TID) | ORAL | Status: DC
  Administered 2022-10-02 – 2022-10-12 (×28): 800 mg via ORAL
  Filled 2022-10-02 (×29): qty 1

## 2022-10-02 MED ORDER — TRIAMCINOLONE ACETONIDE 0.5 % EX OINT
TOPICAL_OINTMENT | Freq: Four times a day (QID) | CUTANEOUS | Status: AC | PRN
  Filled 2022-10-02: qty 15

## 2022-10-02 NOTE — Progress Notes (Signed)
Mobility Specialist Progress Note:   10/02/22 1420  Mobility  Activity Ambulated with assistance in hallway  Level of Assistance Minimal assist, patient does 75% or more  Assistive Device Front wheel walker  Distance Ambulated (ft) 150 ft  Activity Response Tolerated well  Mobility Referral Yes  $Mobility charge 1 Mobility   Pt agreeable to mobility session. Required minA at times d/t erratic/impulsive movements, otherwise steadying assist. Pt back in bed with all needs met, visitors present.   Nelta Numbers Mobility Specialist Please contact via SecureChat or  Rehab office at [phone]

## 2022-10-02 NOTE — Progress Notes (Signed)
PROGRESS NOTE    Randy Ross   ZHY:[id number] DOB: [birth year]  DOA: 08/24/2022 Date of Service: 10/02/22 PCP: Medicine, [place] Narrative / Hospital Course:  Randy Ross is a 51 y.o. male with past medical history significant for ESRD on HD, DM2, essential hypertension who presented to [facility] ED with sudden onset slurred speech, headache.  Workup in the ER with elevated BP of 260/140 and CT head showing cerebellar hemorrhage.  Patient was initially intubated in the ED for airway protection and admitted to the PCCM/neurology service.    Significant events: 11/14: Admitted for acute cerebellar hemorrhage; intubated in the ER, started on hypertonic saline 11/15: Neurosurgery were consulted, recommended against surgical evacuation 11/16: Cefepime started for suspected aspiration 11/17: Required 1u PRBC transfusion due to CRRT filter 11/19: EEG nonspecific 11/21: MRI brain shows new scattered bilateral acute infarcts 11/22: Completed 7 days antibiotics 11/24: IR consulted given recirculation syndrome, high BUN; plan for fistulogram pending 11/27: Temp cath HD cath placed  11/30: Self-extubated 12/1: Persistent delirium 12/2: Transferred out of ICU to [facility] service 12/3: Pulled out cortrak 12/4: Failed SLP, needs cortrak, fistulogram shows poor outflow 12/5: TDC placed, feeding tube replaced 12/6: HD catheter placed by IR 12/9: Family meeting with palliative care, family to decide on possible transitioning to comfort measures  12/11: Family decided to continue aggressive measures, IR consulted for PEG tube 12/12: SLP requesting MBS prior to PEG tube placement; MBS likely plan for 12/12 12/13: MBS done and recommending dysphagia 1 diet with honey thick liquids  Today 12/23: Waiting on CIR in [place]    Consultants:  Nephrology Neurosurgery  Vascular Surgery  IR Palliative Care   Procedures: See above       ASSESSMENT &  PLAN:   Principal Problem:   Acute cerebellar hemorrhage ([place]) Active Problems:   Anemia   ESRD (end stage renal disease) ([place])   Diabetes mellitus ([place])   Acute respiratory failure with hypoxia ([place])   Hypertensive emergency   Pressure injury of skin   Acute metabolic encephalopathy and delirium   Cerebral edema ([place])   Acute embolic stroke ([place])   Lower GI bleeding   Dysphagia   Palliative care by specialist    Acute Cerebellar hemorrhage 2/2 poorly controlled hypertension, cerebral edema  Patient presenting to ED via EMS with mental status change, left-sided weakness, slurred speech and left-sided gaze preference.  He was severely hypertensive on admission, was placed in the ICU and was intubated for airway protection, placed on Cleviprex infusion.  CT scan was notable for IPH right cerebellar hemisphere with mass effect.   He eventually improved, neurology signed off, and was transferred to the hospitalist service. Neurologically he has remained stable and he is stable for discharge. Awaiting insurance authorization for inpatient rehab in [facility], after that is obtained he will need HD spot approval in [place].  SW involved  Hyperkalemia BMP today pending   Dysphagia, improving  initially requiring a core track, there were plans for PEG tube but he is improving, eating on his own, now does not need a PEG nor has the core track anymore.  Able to eat on his own Continue po diet    Lower GI Bleeding  Noted by nurse, transient, no change in hemoglobin.   No further bleeding reported.   Monitor hemoglobin, overall stable   Acute Embolic CVA  MR brain 29/52 with many new acute infarcts throughout the bilateral frontal and parietal white matter  consistent with embolism. TTE with LVEF 50-55%.  LDL 91, hemoglobin A1c 5.9.  Holding antiplatelets due to cerebellar hemorrhage.  Neurology now signed off; and do not recommend further evaluation for cardiac source of embolism  as he is not a good long-term anticoagulation candidate.   Hypertensive Emergency  BP was severely elevated on admission, likely leading to acute cerebellar hemorrhage.   Currently on amlodipine, Coreg, hydralazine.     Acute Respiratory Failure with Hypoxia - Resolved   Aspiration Pneumonia - completed antibiotics   Acute Metabolic Encephalopathy - better, but remains impulsive at times, trying to pull out his tunneled HD cath and requiring wrist restraint   Type 2 Diabetes Mellitus-Hemoglobin A1c 5.9 08/25/2022, well-controlled.   ESRD on HD, hyperkalemia.   Vascular consulted that he needs a fistula revision, this could be done as an outpatient.  For now he is having HD via  tunneled cath BMP today pending    Anemia of Chronic Medical/renal disease -Hemoglobin stable, continue intermittent monitoring.    Hyponatremia -mild, monitor    Clotted Fistula -Fistulogram showed poor outflow from fistula, nephrology consulted IR for HD catheter placement which was performed on 12/6.   DVT prophylaxis: SCD Pertinent IV fluids/nutrition: no continuous IV fluids, tolerating solid diet Central lines / invasive devices: dialysis tunneled catheter   Code Status: FULL CODE  Disposition: plan CIR TOC needs: CIR placement in [place] per family wishes  Barriers to discharge / significant pending items: see above - placement              Subjective:  Patient reports no concerns - he is actively eating lunch through my exam  Family is at bedside, they reports no CP/SOB, state pain is controlled, no new weakness, no concerns w/ urination/defecation.   Family Communication: at bedside on rounds     Objective Findings:  Vitals:   10/01/22 2347 10/02/22 0355 10/02/22 0738 10/02/22 1115  BP: 128/71 139/80 (!) 154/70 137/80  Pulse: 76 70 78 78  Resp: '17 18 16 16  '$ Temp: 98.2 F (36.8 C) 98.2 F (36.8 C) 98.6 F (37 C) 98.1 F (36.7 C)  TempSrc: Oral Oral Oral Oral  SpO2:  98% 96% 98% 99%  Weight:      Height:       No intake or output data in the 24 hours ending 10/02/22 1423  Filed Weights   09/27/22 1209 09/29/22 0811 09/29/22 1224  Weight: 74.9 kg 77 kg 75.1 kg    Examination: Constitutional:  VS as above General Appearance: alert, well-developed, well-nourished, NAD Ears, Nose, Mouth, Throat: Normal external appearance MMM Respiratory: Normal respiratory effort No wheeze No rhonchi No rales Cardiovascular: S1/S2 normal No murmur RRR No lower extremity edema Gastrointestinal: No tenderness Musculoskeletal:  No clubbing/cyanosis of digits Symmetrical movement in all extremities Neurological: No cranial nerve deficit on limited exam Alert Psychiatric: Normal judgment/insight Normal mood and affect       Scheduled Medications:   amLODipine  5 mg Oral BID   carvedilol  25 mg Oral BID WC   Chlorhexidine Gluconate Cloth  6 each Topical Q0600   darbepoetin (ARANESP) injection - DIALYSIS  150 mcg Subcutaneous Q Mon-1800   feeding supplement (NEPRO CARB STEADY)  237 mL Oral TID WC   heparin injection (subcutaneous)  5,000 Units Subcutaneous Q8H   hydrALAZINE  100 mg Oral Q8H   insulin aspart  0-15 Units Subcutaneous Q6H   insulin glargine-yfgn  10 Units Subcutaneous Daily   multivitamin  1  tablet Oral QHS   mouth rinse  15 mL Mouth Rinse 4 times per day   pantoprazole  40 mg Oral BID   QUEtiapine  25 mg Oral QHS   sevelamer carbonate  800 mg Oral TID WC    Continuous Infusions:  sodium chloride Stopped (08/30/22 0033)    PRN Medications:  sodium chloride, acetaminophen (TYLENOL) oral liquid 160 mg/5 mL, bisacodyl, fentaNYL (SUBLIMAZE) injection, hydrALAZINE, hydrOXYzine, labetalol, LORazepam, ondansetron (ZOFRAN) IV, mouth rinse, triamcinolone ointment  Antimicrobials:  Anti-infectives (From admission, onward)    Start     Dose/Rate Route Frequency Ordered Stop   09/14/22 1145  ceFAZolin (ANCEF) IVPB 2g/100 mL premix         2 g 200 mL/hr over 30 Minutes Intravenous To Radiology 09/14/22 1123 09/15/22 0905   08/29/22 2000  cefTRIAXone (ROCEPHIN) 2 g in sodium chloride 0.9 % 100 mL IVPB        2 g 200 mL/hr over 30 Minutes Intravenous Every 24 hours 08/29/22 1009 09/01/22 2146   08/28/22 2000  ceFEPIme (MAXIPIME) 1 g in sodium chloride 0.9 % 100 mL IVPB  Status:  Discontinued        1 g 200 mL/hr over 30 Minutes Intravenous Every 24 hours 08/27/22 1009 08/27/22 1016   08/27/22 2000  ceFEPIme (MAXIPIME) 1 g in sodium chloride 0.9 % 100 mL IVPB  Status:  Discontinued        1 g 200 mL/hr over 30 Minutes Intravenous Every 24 hours 08/27/22 1016 08/29/22 1009   08/26/22 1000  ceFEPIme (MAXIPIME) 2 g in sodium chloride 0.9 % 100 mL IVPB  Status:  Discontinued        2 g 200 mL/hr over 30 Minutes Intravenous Every 12 hours 08/26/22 0850 08/27/22 1009           Data Reviewed: I have personally reviewed following labs and imaging studies  CBC: Recent Labs  Lab 09/26/22 0415 09/27/22 0403 09/28/22 0341 09/29/22 0821 10/01/22 0343  WBC 9.7 10.3 8.7 8.7 9.2  NEUTROABS 6.3 7.1 6.1 6.3  --   HGB 10.8* 11.0* 10.8* 10.5* 10.1*  HCT 33.8* 32.6* 34.1* 33.2* 30.5*  MCV 70.0* 68.1* 70.7* 70.8* 69.6*  PLT 319 314 330 338 440   Basic Metabolic Panel: Recent Labs  Lab 09/26/22 0415 09/27/22 0403 09/28/22 0341 09/29/22 0821 10/01/22 0343  NA 131* 130* 133* 131* 133*  K 3.7 4.4 4.4 5.2* 5.5*  CL 92* 90* 94* 92* 97*  CO2 '25 22 25 22 23  '$ GLUCOSE 57* 83 87 124* 85  BUN 64* 85* 45* 70* 60*  CREATININE 7.62* 9.65* 6.63* 9.57* 9.53*  CALCIUM 9.0 8.9 9.0 8.9 9.0  MG 2.0 2.0 2.0 2.0 1.9  PHOS 4.9* 5.2* 4.1 5.5*  --    GFR: Estimated Creatinine Clearance: 8.7 mL/min (A) (by C-G formula based on SCr of 9.53 mg/dL (H)). Liver Function Tests: Recent Labs  Lab 09/26/22 0415 09/27/22 0403 09/28/22 0341 09/29/22 0821 10/01/22 0343  AST 36 '26 23 20 17  '$ ALT 37 '28 25 21 18  '$ ALKPHOS 81 73 78 68 64   BILITOT 0.5 0.5 0.3 0.5 0.3  PROT 7.5 7.2 7.4 7.2 6.7  ALBUMIN 3.4* 3.3* 3.3* 3.2* 3.0*   No results for input(s): "LIPASE", "AMYLASE" in the last 168 hours. No results for input(s): "AMMONIA" in the last 168 hours. Coagulation Profile: No results for input(s): "INR", "PROTIME" in the last 168 hours. Cardiac Enzymes: No results for input(s): "CKTOTAL", "CKMB", "CKMBINDEX", "  TROPONINI" in the last 168 hours. BNP (last 3 results) No results for input(s): "PROBNP" in the last 8760 hours. HbA1C: No results for input(s): "HGBA1C" in the last 72 hours. CBG: Recent Labs  Lab 10/01/22 1313 10/01/22 1715 10/01/22 2347 10/02/22 0622 10/02/22 1116  GLUCAP 107* 186* 128* 203* 123*   Lipid Profile: Recent Labs    10/02/22 0647  TRIG 79   Thyroid Function Tests: No results for input(s): "TSH", "T4TOTAL", "FREET4", "T3FREE", "THYROIDAB" in the last 72 hours. Anemia Panel: No results for input(s): "VITAMINB12", "FOLATE", "FERRITIN", "TIBC", "IRON", "RETICCTPCT" in the last 72 hours. Most Recent Urinalysis On File:     Component Value Date/Time   COLORURINE YELLOW 12/08/2021 1505   APPEARANCEUR HAZY (A) 12/08/2021 1505   LABSPEC 1.015 12/08/2021 1505   PHURINE 5.0 12/08/2021 1505   GLUCOSEU 150 (A) 12/08/2021 1505   HGBUR NEGATIVE 12/08/2021 1505   BILIRUBINUR NEGATIVE 12/08/2021 1505   KETONESUR NEGATIVE 12/08/2021 1505   PROTEINUR >=300 (A) 12/08/2021 1505   NITRITE NEGATIVE 12/08/2021 1505   LEUKOCYTESUR NEGATIVE 12/08/2021 1505   Sepsis Labs: '@LABRCNTIP'$ (procalcitonin:4,lacticidven:4)  No results found for this or any previous visit (from the past 240 hour(s)).       Radiology Studies: DG Chest Port 1 View  Result Date: 08/25/2022 CLINICAL DATA:  Central line placement. EXAM: PORTABLE CHEST 1 VIEW COMPARISON:  Chest 08/25/2022 FINDINGS: Endotracheal tube has been advanced now 1 cm above the carina. Feeding tube enters the stomach with the tip at the gastric antrum or  duodenal bulb. Left jugular dual lumen catheter extends into the SVC at the cavoatrial junction. No pneumothorax. Cardiac enlargement with mild vascular congestion unchanged. Mild bibasilar atelectasis unchanged. IMPRESSION: 1. Left jugular catheter tip at the cavoatrial junction. No pneumothorax. 2. Endotracheal tube 1 cm above the carina. 3. Mild bibasilar atelectasis unchanged. 4. Feeding tube tip in the region of the gastric antrum or duodenal bulb. Electronically Signed   By: Franchot Gallo M.D.   On: 08/25/2022 12:57   ECHOCARDIOGRAM COMPLETE  Result Date: 08/25/2022    ECHOCARDIOGRAM REPORT   Patient Name:   Ronald Londo Date of Exam: 08/25/2022 Medical Rec #:  [id number]         Height:       65.0 in Accession #:    [id number]        Weight:       177.2 lb Date of Birth:  07-03-1971        BSA:          1.879 m Patient Age:    89 years          BP:           136/59 mmHg Patient Gender: M                 HR:           62 bpm. Exam Location:  Inpatient Procedure: 2D Echo, Cardiac Doppler and Color Doppler Indications:    Stroke  History:        Patient has no prior history of Echocardiogram examinations.                 Risk Factors:Diabetes and Hypertension. ESRD.  Sonographer:    Clayton Lefort RDCS (AE) Referring Phys: Rosalin Hawking  Sonographer Comments: Echo performed with patient supine and on artificial respirator and suboptimal subcostal window. IMPRESSIONS  1. Left ventricular ejection fraction, by estimation, is 50 to 55%. The left ventricle has low normal  function. The left ventricle has no regional wall motion abnormalities. There is moderate left ventricular hypertrophy. Left ventricular diastolic parameters are consistent with Grade I diastolic dysfunction (impaired relaxation).  2. Right ventricular systolic function is normal. The right ventricular size is normal.  3. The mitral valve is normal in structure. No evidence of mitral valve regurgitation. No evidence of mitral stenosis.  4. The  aortic valve is normal in structure. Aortic valve regurgitation is trivial. No aortic stenosis is present.  5. The inferior vena cava is normal in size with greater than 50% respiratory variability, suggesting right atrial pressure of 3 mmHg. Conclusion(s)/Recommendation(s): No intracardiac source of embolism detected on this transthoracic study. Consider a transesophageal echocardiogram to exclude cardiac source of embolism if clinically indicated. FINDINGS  Left Ventricle: Left ventricular ejection fraction, by estimation, is 50 to 55%. The left ventricle has low normal function. The left ventricle has no regional wall motion abnormalities. The left ventricular internal cavity size was normal in size. There is moderate left ventricular hypertrophy. Left ventricular diastolic parameters are consistent with Grade I diastolic dysfunction (impaired relaxation). Right Ventricle: The right ventricular size is normal. No increase in right ventricular wall thickness. Right ventricular systolic function is normal. Left Atrium: Left atrial size was normal in size. Right Atrium: Right atrial size was normal in size. Pericardium: Trivial pericardial effusion is present. The pericardial effusion is posterior and lateral to the left ventricle. Mitral Valve: The mitral valve is normal in structure. No evidence of mitral valve regurgitation. No evidence of mitral valve stenosis. Tricuspid Valve: The tricuspid valve is normal in structure. Tricuspid valve regurgitation is trivial. No evidence of tricuspid stenosis. Aortic Valve: The aortic valve is normal in structure. Aortic valve regurgitation is trivial. No aortic stenosis is present. Aortic valve mean gradient measures 2.0 mmHg. Aortic valve peak gradient measures 3.7 mmHg. Aortic valve area, by VTI measures 3.76 cm. Pulmonic Valve: The pulmonic valve was normal in structure. Pulmonic valve regurgitation is not visualized. No evidence of pulmonic stenosis. Aorta: The aortic  root is normal in size and structure. Venous: The inferior vena cava is normal in size with greater than 50% respiratory variability, suggesting right atrial pressure of 3 mmHg. IAS/Shunts: No atrial level shunt detected by color flow Doppler.  LEFT VENTRICLE PLAX 2D LV PW:         1.88 cm   Diastology LV IVS:        1.36 cm   LV e' medial:    3.81 cm/s LVOT diam:     2.20 cm   LV E/e' medial:  17.5 LV SV:         69        LV e' lateral:   5.55 cm/s LV SV Index:   37        LV E/e' lateral: 12.0 LVOT Area:     3.80 cm  RIGHT VENTRICLE             IVC RV Basal diam:  2.80 cm     IVC diam: 1.80 cm RV S prime:     15.10 cm/s TAPSE (M-mode): 2.2 cm LEFT ATRIUM             Index        RIGHT ATRIUM           Index LA Vol (A2C):   42.8 ml 22.78 ml/m  RA Area:     10.80 cm LA Vol (A4C):   44.0 ml 23.41 ml/m  RA Volume:   17.70 ml  9.42 ml/m LA Biplane Vol: 45.9 ml 24.43 ml/m  AORTIC VALVE AV Area (Vmax):    3.67 cm AV Area (Vmean):   3.39 cm AV Area (VTI):     3.76 cm AV Vmax:           95.80 cm/s AV Vmean:          62.200 cm/s AV VTI:            0.183 m AV Peak Grad:      3.7 mmHg AV Mean Grad:      2.0 mmHg LVOT Vmax:         92.40 cm/s LVOT Vmean:        55.400 cm/s LVOT VTI:          0.181 m LVOT/AV VTI ratio: 0.99  AORTA Ao Root diam: 3.50 cm Ao Asc diam:  3.70 cm MITRAL VALVE MV Area (PHT): 2.48 cm    SHUNTS MV Decel Time: 306 msec    Systemic VTI:  0.18 m MV E velocity: 66.50 cm/s  Systemic Diam: 2.20 cm MV A velocity: 75.90 cm/s MV E/A ratio:  0.88 Candee Furbish MD Electronically signed by Candee Furbish MD Signature Date/Time: 08/25/2022/12:17:53 PM    Final    CT ANGIO HEAD NECK W WO CM (CODE STROKE)  Result Date: 08/25/2022 CLINICAL DATA:  Acute neuro deficit with stroke suspected. EXAM: CT ANGIOGRAPHY HEAD AND NECK TECHNIQUE: Multidetector CT imaging of the head and neck was performed using the standard protocol during bolus administration of intravenous contrast. Multiplanar CT image reconstructions  and MIPs were obtained to evaluate the vascular anatomy. Carotid stenosis measurements (when applicable) are obtained utilizing NASCET criteria, using the distal internal carotid diameter as the denominator. RADIATION DOSE REDUCTION: This exam was performed according to the departmental dose-optimization program which includes automated exposure control, adjustment of the mA and/or kV according to patient size and/or use of iterative reconstruction technique. CONTRAST:  62m OMNIPAQUE IOHEXOL 350 MG/ML SOLN COMPARISON:  Head CT from yesterday FINDINGS: CT HEAD FINDINGS Brain: Ovoid hematoma in the deep right cerebellum measuring 3.3 cm in length by 2.5 cm in maximal thickness, extending towards and likely into the fourth ventricle (triangular shape on sagittal reformats) but no intraventricular spread. Rim of adjacent edema without hydrocephalus. No visible infarct or mass. Vascular: See below Skull: Negative Sinuses/Orbits: Right cataract resection. Review of the MIP images confirms the above findings CTA NECK FINDINGS Aortic arch: No acute finding Right carotid system: Atheromatous plaque at the bifurcation without stenosis or ulceration. Left carotid system: Mild atheromatous plaque at the bifurcation. No stenosis or ulceration. Vertebral arteries: No proximal subclavian stenosis. The vertebral arteries are smoothly contoured and diffusely patent. Skeleton: Spinal curvature without acute finding Other neck: Tongue distortion from endotracheal tube. Upper chest: Atelectasis seen in the apical lungs Review of the MIP images confirms the above findings CTA HEAD FINDINGS Anterior circulation: Atheromatous calcification along the carotid siphons without flow reducing stenosis or aneurysm. Posterior circulation: Vertebral and basilar tortuosity with atheromatous calcification. No branch occlusion, beading, aneurysm, or vascular malformation. No spot sign Venous sinuses: Unremarkable for the arterial phase Anatomic  variants: None significant Review of the MIP images confirms the above findings IMPRESSION: 1. No vascular lesion or spot sign seen at the right cerebellar hemorrhage. The hemorrhage is size stable from earlier today. There is likely extension into the fourth ventricle but no intraventricular spread or hydrocephalus. 2. Premature atherosclerosis with notable vessel tortuosity  suggesting chronic hypertension. Electronically Signed   By: Jorje Guild M.D.   On: 08/25/2022 06:10   CT HEAD CODE STROKE WO CONTRAST  Addendum Date: 08/25/2022   ADDENDUM REPORT: 08/25/2022 02:33 ADDENDUM: Critical Value/emergent results were called by telephone at the time of interpretation on 08/24/2022 at 11:58 pm to provider [facility] , who verbally acknowledged these results. Electronically Signed   By: Ulyses Jarred M.D.   On: 08/25/2022 02:33   Result Date: 08/25/2022 CLINICAL DATA:  Code stroke.  Acute neurologic deficits EXAM: CT HEAD WITHOUT CONTRAST TECHNIQUE: Contiguous axial images were obtained from the base of the skull through the vertex without intravenous contrast. RADIATION DOSE REDUCTION: This exam was performed according to the departmental dose-optimization program which includes automated exposure control, adjustment of the mA and/or kV according to patient size and/or use of iterative reconstruction technique. COMPARISON:  None Available. FINDINGS: Brain: There is an acute intraparenchymal hematoma centered in the right cerebellar hemisphere measuring 2.6 x 1.8 cm with a craniocaudal dimension of 2.2 cm (volume equals 5.1 mL). There is severe mass effect on the fourth ventricle and cerebral aqueduct. No hydrocephalus. There is periventricular hypoattenuation compatible with chronic microvascular disease. Vascular: No abnormal hyperdensity of the major intracranial arteries or dural venous sinuses. No intracranial atherosclerosis. Skull: The visualized skull base, calvarium and extracranial soft tissues  are normal. Sinuses/Orbits: No fluid levels or advanced mucosal thickening of the visualized paranasal sinuses. No mastoid or middle ear effusion. The orbits are normal. IMPRESSION: Acute intraparenchymal hematoma centered in the right cerebellar hemisphere with severe mass effect on the fourth ventricle and cerebral aqueduct. No hydrocephalus. Electronically Signed: By: Ulyses Jarred M.D. On: 08/24/2022 23:42   DG Chest Port 1 View  Result Date: 08/25/2022 CLINICAL DATA:  Respiratory failure EXAM: PORTABLE CHEST 1 VIEW COMPARISON:  05/19/2022 FINDINGS: Endotracheal tube is in place 6.2 cm above the carina with its tip at the level of the clavicular heads. Lung volumes are small and there is right basilar atelectasis. No pneumothorax or pleural effusion. Mild cardiomegaly is present. Trace perihilar interstitial pulmonary edema noted. No acute bone abnormality. IMPRESSION: 1. Endotracheal tube at the thoracic inlet, 6.2 cm above the carina. 2. Pulmonary hypoinflation. 3. Trace perihilar interstitial pulmonary edema, possibly cardiogenic in nature. 4. Mild cardiomegaly. Electronically Signed   By: Fidela Salisbury M.D.   On: 08/25/2022 00:43            LOS: 39 days    Time spent: 35 min    Emeterio Reeve, DO [facility] 10/02/2022, 2:23 PM    Dictation software may have been used to generate the above note. Typos may occur and escape review in typed/dictated notes. Please contact Dr Sheppard Coil directly for clarity if needed.  Staff may message me via secure chat in [place]  but this may not receive an immediate response,  please page me for urgent matters!  If 7PM-7AM, please contact night coverage [email or web address]

## 2022-10-02 NOTE — Plan of Care (Signed)
  Problem: Activity: Goal: Ability to tolerate increased activity will improve Outcome: Progressing   Problem: Respiratory: Goal: Ability to maintain a clear airway and adequate ventilation will improve Outcome: Progressing   Problem: Role Relationship: Goal: Method of communication will improve Outcome: Progressing   Problem: Education: Goal: Ability to describe self-care measures that may prevent or decrease complications (Diabetes Survival Skills Education) will improve Outcome: Progressing Goal: Individualized Educational Video(s) Outcome: Progressing   Problem: Coping: Goal: Ability to adjust to condition or change in health will improve Outcome: Progressing   Problem: Fluid Volume: Goal: Ability to maintain a balanced intake and output will improve Outcome: Progressing   Problem: Health Behavior/Discharge Planning: Goal: Ability to identify and utilize available resources and services will improve Outcome: Progressing Goal: Ability to manage health-related needs will improve Outcome: Progressing   Problem: Metabolic: Goal: Ability to maintain appropriate glucose levels will improve Outcome: Progressing   Problem: Nutritional: Goal: Maintenance of adequate nutrition will improve Outcome: Progressing Goal: Progress toward achieving an optimal weight will improve Outcome: Progressing   Problem: Skin Integrity: Goal: Risk for impaired skin integrity will decrease Outcome: Progressing   Problem: Tissue Perfusion: Goal: Adequacy of tissue perfusion will improve Outcome: Progressing   Problem: Safety: Goal: Non-violent Restraint(s) Outcome: Progressing   Problem: Education: Goal: Knowledge of General Education information will improve Description: Including pain rating scale, medication(s)/side effects and non-pharmacologic comfort measures Outcome: Progressing   Problem: Health Behavior/Discharge Planning: Goal: Ability to manage health-related needs will  improve Outcome: Progressing   Problem: Clinical Measurements: Goal: Ability to maintain clinical measurements within normal limits will improve Outcome: Progressing Goal: Will remain free from infection Outcome: Progressing Goal: Diagnostic test results will improve Outcome: Progressing Goal: Respiratory complications will improve Outcome: Progressing Goal: Cardiovascular complication will be avoided Outcome: Progressing   Problem: Activity: Goal: Risk for activity intolerance will decrease Outcome: Progressing   Problem: Nutrition: Goal: Adequate nutrition will be maintained Outcome: Progressing   Problem: Coping: Goal: Level of anxiety will decrease Outcome: Progressing   Problem: Elimination: Goal: Will not experience complications related to bowel motility Outcome: Progressing Goal: Will not experience complications related to urinary retention Outcome: Progressing   Problem: Pain Managment: Goal: General experience of comfort will improve Outcome: Progressing   Problem: Safety: Goal: Ability to remain free from injury will improve Outcome: Progressing   Problem: Skin Integrity: Goal: Risk for impaired skin integrity will decrease Outcome: Progressing   Problem: Education: Goal: Knowledge of disease or condition will improve Outcome: Progressing Goal: Knowledge of secondary prevention will improve (MUST DOCUMENT ALL) Outcome: Progressing Goal: Knowledge of patient specific risk factors will improve Elta [place] N/A or DELETE if not current risk factor) Outcome: Progressing

## 2022-10-02 NOTE — Progress Notes (Addendum)
[facility] Progress Note   Subjective: Seen in room with fiance' present. He recognizes me. Eating now, oriented X 2. No Issues with HD 10/01/2022 Net UF 1.3 L.    Objective Vitals:   10/01/22 2030 10/01/22 2347 10/02/22 0355 10/02/22 0738  BP: 117/71 128/71 139/80 (!) 154/70  Pulse: 77 76 70 78  Resp: '17 17 18 16  '$ Temp: 98.4 F (36.9 C) 98.2 F (36.8 C) 98.2 F (36.8 C) 98.6 F (37 C)  TempSrc: Oral Oral Oral Oral  SpO2: 98% 98% 96% 98%  Weight:      Height:       Physical Exam General: Chronically ill appearing male in NAD Heart: S1,S2 RRR No M/R/G Lungs: CTAB A/P Abdomen: soft, NABS Extremities:No LE edema Dialysis Access: L AVF + T/B RIJ TDC drsg intact   Additional Objective Labs: Basic Metabolic Panel: Recent Labs  Lab 09/27/22 0403 09/28/22 0341 09/29/22 0821 10/01/22 0343  NA 130* 133* 131* 133*  K 4.4 4.4 5.2* 5.5*  CL 90* 94* 92* 97*  CO2 '22 25 22 23  '$ GLUCOSE 83 87 124* 85  BUN 85* 45* 70* 60*  CREATININE 9.65* 6.63* 9.57* 9.53*  CALCIUM 8.9 9.0 8.9 9.0  PHOS 5.2* 4.1 5.5*  --    Liver Function Tests: Recent Labs  Lab 09/28/22 0341 09/29/22 0821 10/01/22 0343  AST '23 20 17  '$ ALT '25 21 18  '$ ALKPHOS 78 68 64  BILITOT 0.3 0.5 0.3  PROT 7.4 7.2 6.7  ALBUMIN 3.3* 3.2* 3.0*   No results for input(s): "LIPASE", "AMYLASE" in the last 168 hours. CBC: Recent Labs  Lab 09/26/22 0415 09/27/22 0403 09/28/22 0341 09/29/22 0821 10/01/22 0343  WBC 9.7 10.3 8.7 8.7 9.2  NEUTROABS 6.3 7.1 6.1 6.3  --   HGB 10.8* 11.0* 10.8* 10.5* 10.1*  HCT 33.8* 32.6* 34.1* 33.2* 30.5*  MCV 70.0* 68.1* 70.7* 70.8* 69.6*  PLT 319 314 330 338 295   Blood Culture    Component Value Date/Time   SDES TRACHEAL ASPIRATE 08/26/2022 1212   SPECREQUEST NONE 08/26/2022 1212   CULT  08/26/2022 1212    ABUNDANT STREPTOCOCCUS ANGINOSIS RARE KLEBSIELLA OXYTOCA ABUNDANT HAEMOPHILUS INFLUENZAE BETA LACTAMASE NEGATIVE Performed at [facility],  [place] [address], [place], [place] [address]    REPTSTATUS 08/29/2022 FINAL 08/26/2022 1212    Cardiac Enzymes: No results for input(s): "CKTOTAL", "CKMB", "CKMBINDEX", "TROPONINI" in the last 168 hours. CBG: Recent Labs  Lab 10/01/22 1141 10/01/22 1313 10/01/22 1715 10/01/22 2347 10/02/22 0622  GLUCAP 95 107* 186* 128* 203*   Iron Studies: No results for input(s): "IRON", "TIBC", "TRANSFERRIN", "FERRITIN" in the last 72 hours. '@lablastinr3'$ @ Studies/Results: No results found. Medications:  sodium chloride Stopped (08/30/22 0033)    amLODipine  5 mg Oral BID   carvedilol  25 mg Oral BID WC   Chlorhexidine Gluconate Cloth  6 each Topical Q0600   darbepoetin (ARANESP) injection - DIALYSIS  150 mcg Subcutaneous Q Mon-1800   feeding supplement (NEPRO CARB STEADY)  237 mL Oral TID WC   heparin injection (subcutaneous)  5,000 Units Subcutaneous Q8H   hydrALAZINE  100 mg Oral Q8H   insulin aspart  0-15 Units Subcutaneous Q6H   insulin glargine-yfgn  10 Units Subcutaneous Daily   multivitamin  1 tablet Oral QHS   mouth rinse  15 mL Mouth Rinse 4 times per day   pantoprazole  40 mg Oral BID   QUEtiapine  25 mg Oral QHS   sevelamer  carbonate  2.4 g Oral TID WC     Dialysis Orders: SW MWF 3h 56mn  80kg   400/800   2/2 bath  Hep 2000  LFA AVF - last HD 11/13, post 80.1kg - mircera 150 q2, last 11/13 - hectorol 430m IV q HD     Assessment/Plan: 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 S/P course of hypertonic fluids.  Now doing rehab, eating and talking.  AMS - due to #1. Has had significant improvement. Oriented X 2. Follows commands when prompted by fiance. HTN/ vol  - Volume appears stable. He is getting po BP meds x 3, BP controlled and stable. Avoid hypotension in HD. Volume seems fine.  ESRD - pt required CRRT 11/15-11/17. Resumed IH MWF. Next HD 10/03/2022. HD access - BUN was very high so access recirc was suspected. IR did fistulogram  12/4 and AVF was found be small with competing veins. Using RIJ TDHintonow placed by IR on 12/6. Consulted [facility]-seen by Dr. CaDonzetta MattersNo plans documented yet.  Anemia of ESRD: Hgb improved to 10.5, continue Aranesp 15041mweekly while here. Secondary HPTH: CCa ok. Renvela 3 ac tid as binder. Continue VDRA.  DM2 - per pmd [place].   Daisi Kentner H. Elijha Dedman NP-C 10/02/2022, 9:43 AM  [phone]

## 2022-10-03 LAB — GLUCOSE, CAPILLARY
Glucose-Capillary: 102 mg/dL — ABNORMAL HIGH (ref 70–99)
Glucose-Capillary: 151 mg/dL — ABNORMAL HIGH (ref 70–99)
Glucose-Capillary: 158 mg/dL — ABNORMAL HIGH (ref 70–99)
Glucose-Capillary: 160 mg/dL — ABNORMAL HIGH (ref 70–99)
Glucose-Capillary: 78 mg/dL (ref 70–99)

## 2022-10-03 MED ORDER — HEPARIN SODIUM (PORCINE) 1000 UNIT/ML IJ SOLN
INTRAMUSCULAR | Status: AC
  Administered 2022-10-03: 3200 [IU]
  Filled 2022-10-03: qty 4

## 2022-10-03 NOTE — Progress Notes (Signed)
In dialysis

## 2022-10-03 NOTE — Progress Notes (Signed)
PROGRESS NOTE    Randy Ross   IWL:[id number] DOB: 16-May-1971  DOA: 08/24/2022 Date of Service: 10/03/22 PCP: Medicine, [place] Narrative / Hospital Course:  Randy Ross is a 51 y.o. male with past medical history significant for ESRD on HD, DM2, essential hypertension who presented to [facility] ED with sudden onset slurred speech, headache.  Workup in the ER with elevated BP of 260/140 and CT head showing cerebellar hemorrhage.  Patient was initially intubated in the ED for airway protection and admitted to the PCCM/neurology service.    Significant events: 11/14: Admitted for acute cerebellar hemorrhage; intubated in the ER, started on hypertonic saline 11/15: Neurosurgery were consulted, recommended against surgical evacuation 11/16: Cefepime started for suspected aspiration 11/17: Required 1u PRBC transfusion due to CRRT filter 11/19: EEG nonspecific 11/21: MRI brain shows new scattered bilateral acute infarcts 11/22: Completed 7 days antibiotics 11/24: IR consulted given recirculation syndrome, high BUN; plan for fistulogram pending 11/27: Temp cath HD cath placed  11/30: Self-extubated 12/1: Persistent delirium 12/2: Transferred out of ICU to [facility] service 12/3: Pulled out cortrak 12/4: Failed SLP, needs cortrak, fistulogram shows poor outflow 12/5: TDC placed, feeding tube replaced 12/6: HD catheter placed by IR 12/9: Family meeting with palliative care  12/11: Family decided to continue aggressive measures, IR consulted for PEG tube 12/12: SLP requesting MBS prior to PEG tube placement; MBS likely plan for 12/12 12/13: MBS done and recommending dysphagia 1 diet with honey thick liquids  Today 12/24: Tolerating po intake. Waiting on CIR in [place]    Consultants:  Nephrology Neurosurgery  Vascular Surgery  IR Palliative Care   Procedures: See above       ASSESSMENT & PLAN:   Principal Problem:   Acute  cerebellar hemorrhage (HCC) Active Problems:   Anemia   ESRD (end stage renal disease) ([place])   Diabetes mellitus ([place])   Acute respiratory failure with hypoxia ([place])   Hypertensive emergency   Pressure injury of skin   Acute metabolic encephalopathy and delirium   Cerebral edema (HCC)   Acute embolic stroke ([place])   Lower GI bleeding   Dysphagia   Palliative care by specialist    Acute Cerebellar hemorrhage 2/2 poorly controlled hypertension, cerebral edema  Patient presenting to ED via EMS with mental status change, left-sided weakness, slurred speech and left-sided gaze preference.  He was severely hypertensive on admission, was placed in the ICU and was intubated for airway protection, placed on Cleviprex infusion.  CT scan was notable for IPH right cerebellar hemisphere with mass effect.   He eventually improved, neurology signed off, and was transferred to the hospitalist service. Neurologically he has remained stable and he is stable for discharge. Awaiting insurance authorization for inpatient rehab in [facility], after that is obtained he will need HD spot approval in [place].  SW involved  Hyperkalemia Intermittent, d/t ESRD Monitor BMP  Dysphagia, improving  initially requiring a core track, there were plans for PEG tube but he is improving, eating on his own, now does not need a PEG nor has the core track anymore.  Able to eat on his own Continue po diet    Lower GI Bleeding  Noted by nurse, transient, no change in hemoglobin.   No further bleeding reported.   Monitor hemoglobin, overall stable   Acute Embolic CVA  MR brain 17/40 with many new acute infarcts throughout the bilateral frontal and parietal white matter consistent with embolism. TTE with  LVEF 50-55%.  LDL 91, hemoglobin A1c 5.9.  Holding antiplatelets due to cerebellar hemorrhage.  Neurology now signed off; and do not recommend further evaluation for cardiac source of embolism as he is not a good  long-term anticoagulation candidate.   Hypertensive Emergency  BP was severely elevated on admission, likely leading to acute cerebellar hemorrhage.   Currently on amlodipine, Coreg, hydralazine.     Acute Respiratory Failure with Hypoxia - Resolved   Aspiration Pneumonia - completed antibiotics   Acute Metabolic Encephalopathy - better, but remains impulsive at times, trying to pull out his tunneled HD cath and requiring wrist restraint   Type 2 Diabetes Mellitus-Hemoglobin A1c 5.9 08/25/2022, well-controlled.   ESRD on HD, hyperkalemia.   Vascular consulted that he needs a fistula revision, this could be done as an outpatient.  For now he is having HD via  tunneled cath BMP today pending    Anemia of Chronic Medical/renal disease  Hemoglobin stable continue intermittent monitoring.    Hyponatremia -mild, stable  monitor    Clotted Fistula -Fistulogram showed poor outflow from fistula, nephrology consulted IR for HD catheter placement which was performed on 12/6.   DVT prophylaxis: SCD Pertinent IV fluids/nutrition: no continuous IV fluids, tolerating solid diet Central lines / invasive devices: dialysis tunneled catheter   Code Status: FULL CODE  Disposition: plan CIR TOC needs: CIR placement in [place] per family wishes  Barriers to discharge / significant pending items: see above - placement              Subjective:  Patient up in bed, eating lunch. No concerns from patient or RN.   Family Communication: none at this time     Objective Findings:  Vitals:   10/02/22 2326 10/03/22 0334 10/03/22 0759 10/03/22 1151  BP: (!) 146/65 (!) 158/74 (!) 147/87 (!) 148/77  Pulse: 75 67 81 78  Resp: '16 18 16 16  '$ Temp: 97.9 F (36.6 C) (!) 97.5 F (36.4 C) (!) 97.4 F (36.3 C) (!) 97.2 F (36.2 C)  TempSrc: Oral Oral Oral Oral  SpO2: 99% 99% 98% 99%  Weight:      Height:        Intake/Output Summary (Last 24 hours) at 10/03/2022 1402 Last data filed  at 10/03/2022 1300 Gross per 24 hour  Intake 630 ml  Output --  Net 630 ml    Filed Weights   09/27/22 1209 09/29/22 0811 09/29/22 1224  Weight: 74.9 kg 77 kg 75.1 kg    Examination: Constitutional:  VS as above General Appearance: alert, well-developed, well-nourished, NAD Respiratory: Normal respiratory effort Musculoskeletal:  No clubbing/cyanosis of digits Symmetrical movement in all extremities Neurological: No cranial nerve deficit on limited exam Alert Psychiatric: Normal judgment/insight Normal mood and affect       Scheduled Medications:   amLODipine  5 mg Oral BID   carvedilol  25 mg Oral BID WC   Chlorhexidine Gluconate Cloth  6 each Topical Q0600   darbepoetin (ARANESP) injection - DIALYSIS  150 mcg Subcutaneous Q Mon-1800   feeding supplement (NEPRO CARB STEADY)  237 mL Oral TID WC   heparin injection (subcutaneous)  5,000 Units Subcutaneous Q8H   hydrALAZINE  100 mg Oral Q8H   insulin aspart  0-15 Units Subcutaneous Q6H   insulin glargine-yfgn  10 Units Subcutaneous Daily   multivitamin  1 tablet Oral QHS   mouth rinse  15 mL Mouth Rinse 4 times per day   pantoprazole  40 mg Oral BID  QUEtiapine  25 mg Oral QHS   sevelamer carbonate  800 mg Oral TID WC    Continuous Infusions:  sodium chloride Stopped (08/30/22 0033)    PRN Medications:  sodium chloride, acetaminophen (TYLENOL) oral liquid 160 mg/5 mL, bisacodyl, fentaNYL (SUBLIMAZE) injection, hydrALAZINE, hydrOXYzine, labetalol, LORazepam, ondansetron (ZOFRAN) IV, mouth rinse, triamcinolone ointment  Antimicrobials:  Anti-infectives (From admission, onward)    Start     Dose/Rate Route Frequency Ordered Stop   09/14/22 1145  ceFAZolin (ANCEF) IVPB 2g/100 mL premix        2 g 200 mL/hr over 30 Minutes Intravenous To Radiology 09/14/22 1123 09/15/22 0905   08/29/22 2000  cefTRIAXone (ROCEPHIN) 2 g in sodium chloride 0.9 % 100 mL IVPB        2 g 200 mL/hr over 30 Minutes Intravenous  Every 24 hours 08/29/22 1009 09/01/22 2146   08/28/22 2000  ceFEPIme (MAXIPIME) 1 g in sodium chloride 0.9 % 100 mL IVPB  Status:  Discontinued        1 g 200 mL/hr over 30 Minutes Intravenous Every 24 hours 08/27/22 1009 08/27/22 1016   08/27/22 2000  ceFEPIme (MAXIPIME) 1 g in sodium chloride 0.9 % 100 mL IVPB  Status:  Discontinued        1 g 200 mL/hr over 30 Minutes Intravenous Every 24 hours 08/27/22 1016 08/29/22 1009   08/26/22 1000  ceFEPIme (MAXIPIME) 2 g in sodium chloride 0.9 % 100 mL IVPB  Status:  Discontinued        2 g 200 mL/hr over 30 Minutes Intravenous Every 12 hours 08/26/22 0850 08/27/22 1009           Data Reviewed: I have personally reviewed following labs and imaging studies  CBC: Recent Labs  Lab 09/27/22 0403 09/28/22 0341 09/29/22 0821 10/01/22 0343  WBC 10.3 8.7 8.7 9.2  NEUTROABS 7.1 6.1 6.3  --   HGB 11.0* 10.8* 10.5* 10.1*  HCT 32.6* 34.1* 33.2* 30.5*  MCV 68.1* 70.7* 70.8* 69.6*  PLT 314 330 338 202   Basic Metabolic Panel: Recent Labs  Lab 09/27/22 0403 09/28/22 0341 09/29/22 0821 10/01/22 0343 10/02/22 0930  NA 130* 133* 131* 133* 131*  K 4.4 4.4 5.2* 5.5* 4.6  CL 90* 94* 92* 97* 96*  CO2 '22 25 22 23 26  '$ GLUCOSE 83 87 124* 85 175*  BUN 85* 45* 70* 60* 36*  CREATININE 9.65* 6.63* 9.57* 9.53* 6.76*  CALCIUM 8.9 9.0 8.9 9.0 8.5*  MG 2.0 2.0 2.0 1.9  --   PHOS 5.2* 4.1 5.5*  --   --    GFR: Estimated Creatinine Clearance: 12.2 mL/min (A) (by C-G formula based on SCr of 6.76 mg/dL (H)). Liver Function Tests: Recent Labs  Lab 09/27/22 0403 09/28/22 0341 09/29/22 0821 10/01/22 0343  AST '26 23 20 17  '$ ALT '28 25 21 18  '$ ALKPHOS 73 78 68 64  BILITOT 0.5 0.3 0.5 0.3  PROT 7.2 7.4 7.2 6.7  ALBUMIN 3.3* 3.3* 3.2* 3.0*   No results for input(s): "LIPASE", "AMYLASE" in the last 168 hours. No results for input(s): "AMMONIA" in the last 168 hours. Coagulation Profile: No results for input(s): "INR", "PROTIME" in the last 168  hours. Cardiac Enzymes: No results for input(s): "CKTOTAL", "CKMB", "CKMBINDEX", "TROPONINI" in the last 168 hours. BNP (last 3 results) No results for input(s): "PROBNP" in the last 8760 hours. HbA1C: No results for input(s): "HGBA1C" in the last 72 hours. CBG: Recent Labs  Lab 10/02/22 1116  10/02/22 1720 10/02/22 2359 10/03/22 0627 10/03/22 1155  GLUCAP 123* 146* 158* 102* 151*   Lipid Profile: Recent Labs    10/02/22 0647  TRIG 79   Thyroid Function Tests: No results for input(s): "TSH", "T4TOTAL", "FREET4", "T3FREE", "THYROIDAB" in the last 72 hours. Anemia Panel: No results for input(s): "VITAMINB12", "FOLATE", "FERRITIN", "TIBC", "IRON", "RETICCTPCT" in the last 72 hours. Most Recent Urinalysis On File:     Component Value Date/Time   COLORURINE YELLOW 12/08/2021 1505   APPEARANCEUR HAZY (A) 12/08/2021 1505   LABSPEC 1.015 12/08/2021 1505   PHURINE 5.0 12/08/2021 1505   GLUCOSEU 150 (A) 12/08/2021 1505   HGBUR NEGATIVE 12/08/2021 1505   BILIRUBINUR NEGATIVE 12/08/2021 1505   KETONESUR NEGATIVE 12/08/2021 1505   PROTEINUR >=300 (A) 12/08/2021 1505   NITRITE NEGATIVE 12/08/2021 1505   LEUKOCYTESUR NEGATIVE 12/08/2021 1505   Sepsis Labs: '@LABRCNTIP'$ (procalcitonin:4,lacticidven:4)  No results found for this or any previous visit (from the past 240 hour(s)).       Radiology Studies: DG Chest Port 1 View  Result Date: 08/25/2022 CLINICAL DATA:  Central line placement. EXAM: PORTABLE CHEST 1 VIEW COMPARISON:  Chest 08/25/2022 FINDINGS: Endotracheal tube has been advanced now 1 cm above the carina. Feeding tube enters the stomach with the tip at the gastric antrum or duodenal bulb. Left jugular dual lumen catheter extends into the SVC at the cavoatrial junction. No pneumothorax. Cardiac enlargement with mild vascular congestion unchanged. Mild bibasilar atelectasis unchanged. IMPRESSION: 1. Left jugular catheter tip at the cavoatrial junction. No pneumothorax. 2.  Endotracheal tube 1 cm above the carina. 3. Mild bibasilar atelectasis unchanged. 4. Feeding tube tip in the region of the gastric antrum or duodenal bulb. Electronically Signed   By: Franchot Gallo M.D.   On: 08/25/2022 12:57   ECHOCARDIOGRAM COMPLETE  Result Date: 08/25/2022    ECHOCARDIOGRAM REPORT   Patient Name:   Aries Townley Date of Exam: 08/25/2022 Medical Rec #:  [id number]         Height:       65.0 in Accession #:    [id number]        Weight:       177.2 lb Date of Birth:  1971/08/16        BSA:          1.879 m Patient Age:    18 years          BP:           136/59 mmHg Patient Gender: M                 HR:           62 bpm. Exam Location:  Inpatient Procedure: 2D Echo, Cardiac Doppler and Color Doppler Indications:    Stroke  History:        Patient has no prior history of Echocardiogram examinations.                 Risk Factors:Diabetes and Hypertension. ESRD.  Sonographer:    Clayton Lefort RDCS (AE) Referring Phys: Rosalin Hawking  Sonographer Comments: Echo performed with patient supine and on artificial respirator and suboptimal subcostal window. IMPRESSIONS  1. Left ventricular ejection fraction, by estimation, is 50 to 55%. The left ventricle has low normal function. The left ventricle has no regional wall motion abnormalities. There is moderate left ventricular hypertrophy. Left ventricular diastolic parameters are consistent with Grade I diastolic dysfunction (impaired relaxation).  2. Right ventricular systolic function is normal. The  right ventricular size is normal.  3. The mitral valve is normal in structure. No evidence of mitral valve regurgitation. No evidence of mitral stenosis.  4. The aortic valve is normal in structure. Aortic valve regurgitation is trivial. No aortic stenosis is present.  5. The inferior vena cava is normal in size with greater than 50% respiratory variability, suggesting right atrial pressure of 3 mmHg. Conclusion(s)/Recommendation(s): No intracardiac source of  embolism detected on this transthoracic study. Consider a transesophageal echocardiogram to exclude cardiac source of embolism if clinically indicated. FINDINGS  Left Ventricle: Left ventricular ejection fraction, by estimation, is 50 to 55%. The left ventricle has low normal function. The left ventricle has no regional wall motion abnormalities. The left ventricular internal cavity size was normal in size. There is moderate left ventricular hypertrophy. Left ventricular diastolic parameters are consistent with Grade I diastolic dysfunction (impaired relaxation). Right Ventricle: The right ventricular size is normal. No increase in right ventricular wall thickness. Right ventricular systolic function is normal. Left Atrium: Left atrial size was normal in size. Right Atrium: Right atrial size was normal in size. Pericardium: Trivial pericardial effusion is present. The pericardial effusion is posterior and lateral to the left ventricle. Mitral Valve: The mitral valve is normal in structure. No evidence of mitral valve regurgitation. No evidence of mitral valve stenosis. Tricuspid Valve: The tricuspid valve is normal in structure. Tricuspid valve regurgitation is trivial. No evidence of tricuspid stenosis. Aortic Valve: The aortic valve is normal in structure. Aortic valve regurgitation is trivial. No aortic stenosis is present. Aortic valve mean gradient measures 2.0 mmHg. Aortic valve peak gradient measures 3.7 mmHg. Aortic valve area, by VTI measures 3.76 cm. Pulmonic Valve: The pulmonic valve was normal in structure. Pulmonic valve regurgitation is not visualized. No evidence of pulmonic stenosis. Aorta: The aortic root is normal in size and structure. Venous: The inferior vena cava is normal in size with greater than 50% respiratory variability, suggesting right atrial pressure of 3 mmHg. IAS/Shunts: No atrial level shunt detected by color flow Doppler.  LEFT VENTRICLE PLAX 2D LV PW:         1.88 cm   Diastology  LV IVS:        1.36 cm   LV e' medial:    3.81 cm/s LVOT diam:     2.20 cm   LV E/e' medial:  17.5 LV SV:         69        LV e' lateral:   5.55 cm/s LV SV Index:   37        LV E/e' lateral: 12.0 LVOT Area:     3.80 cm  RIGHT VENTRICLE             IVC RV Basal diam:  2.80 cm     IVC diam: 1.80 cm RV S prime:     15.10 cm/s TAPSE (M-mode): 2.2 cm LEFT ATRIUM             Index        RIGHT ATRIUM           Index LA Vol (A2C):   42.8 ml 22.78 ml/m  RA Area:     10.80 cm LA Vol (A4C):   44.0 ml 23.41 ml/m  RA Volume:   17.70 ml  9.42 ml/m LA Biplane Vol: 45.9 ml 24.43 ml/m  AORTIC VALVE AV Area (Vmax):    3.67 cm AV Area (Vmean):   3.39 cm AV Area (VTI):  3.76 cm AV Vmax:           95.80 cm/s AV Vmean:          62.200 cm/s AV VTI:            0.183 m AV Peak Grad:      3.7 mmHg AV Mean Grad:      2.0 mmHg LVOT Vmax:         92.40 cm/s LVOT Vmean:        55.400 cm/s LVOT VTI:          0.181 m LVOT/AV VTI ratio: 0.99  AORTA Ao Root diam: 3.50 cm Ao Asc diam:  3.70 cm MITRAL VALVE MV Area (PHT): 2.48 cm    SHUNTS MV Decel Time: 306 msec    Systemic VTI:  0.18 m MV E velocity: 66.50 cm/s  Systemic Diam: 2.20 cm MV A velocity: 75.90 cm/s MV E/A ratio:  0.88 Candee Furbish MD Electronically signed by Candee Furbish MD Signature Date/Time: 08/25/2022/12:17:53 PM    Final    CT ANGIO HEAD NECK W WO CM (CODE STROKE)  Result Date: 08/25/2022 CLINICAL DATA:  Acute neuro deficit with stroke suspected. EXAM: CT ANGIOGRAPHY HEAD AND NECK TECHNIQUE: Multidetector CT imaging of the head and neck was performed using the standard protocol during bolus administration of intravenous contrast. Multiplanar CT image reconstructions and MIPs were obtained to evaluate the vascular anatomy. Carotid stenosis measurements (when applicable) are obtained utilizing NASCET criteria, using the distal internal carotid diameter as the denominator. RADIATION DOSE REDUCTION: This exam was performed according to the departmental  dose-optimization program which includes automated exposure control, adjustment of the mA and/or kV according to patient size and/or use of iterative reconstruction technique. CONTRAST:  47m OMNIPAQUE IOHEXOL 350 MG/ML SOLN COMPARISON:  Head CT from yesterday FINDINGS: CT HEAD FINDINGS Brain: Ovoid hematoma in the deep right cerebellum measuring 3.3 cm in length by 2.5 cm in maximal thickness, extending towards and likely into the fourth ventricle (triangular shape on sagittal reformats) but no intraventricular spread. Rim of adjacent edema without hydrocephalus. No visible infarct or mass. Vascular: See below Skull: Negative Sinuses/Orbits: Right cataract resection. Review of the MIP images confirms the above findings CTA NECK FINDINGS Aortic arch: No acute finding Right carotid system: Atheromatous plaque at the bifurcation without stenosis or ulceration. Left carotid system: Mild atheromatous plaque at the bifurcation. No stenosis or ulceration. Vertebral arteries: No proximal subclavian stenosis. The vertebral arteries are smoothly contoured and diffusely patent. Skeleton: Spinal curvature without acute finding Other neck: Tongue distortion from endotracheal tube. Upper chest: Atelectasis seen in the apical lungs Review of the MIP images confirms the above findings CTA HEAD FINDINGS Anterior circulation: Atheromatous calcification along the carotid siphons without flow reducing stenosis or aneurysm. Posterior circulation: Vertebral and basilar tortuosity with atheromatous calcification. No branch occlusion, beading, aneurysm, or vascular malformation. No spot sign Venous sinuses: Unremarkable for the arterial phase Anatomic variants: None significant Review of the MIP images confirms the above findings IMPRESSION: 1. No vascular lesion or spot sign seen at the right cerebellar hemorrhage. The hemorrhage is size stable from earlier today. There is likely extension into the fourth ventricle but no  intraventricular spread or hydrocephalus. 2. Premature atherosclerosis with notable vessel tortuosity suggesting chronic hypertension. Electronically Signed   By: JJorje GuildM.D.   On: 08/25/2022 06:10   CT HEAD CODE STROKE WO CONTRAST  Addendum Date: 08/25/2022   ADDENDUM REPORT: 08/25/2022 02:33 ADDENDUM: Critical Value/emergent results were called  by telephone at the time of interpretation on 08/24/2022 at 11:58 pm to provider [facility] , who verbally acknowledged these results. Electronically Signed   By: Ulyses Jarred M.D.   On: 08/25/2022 02:33   Result Date: 08/25/2022 CLINICAL DATA:  Code stroke.  Acute neurologic deficits EXAM: CT HEAD WITHOUT CONTRAST TECHNIQUE: Contiguous axial images were obtained from the base of the skull through the vertex without intravenous contrast. RADIATION DOSE REDUCTION: This exam was performed according to the departmental dose-optimization program which includes automated exposure control, adjustment of the mA and/or kV according to patient size and/or use of iterative reconstruction technique. COMPARISON:  None Available. FINDINGS: Brain: There is an acute intraparenchymal hematoma centered in the right cerebellar hemisphere measuring 2.6 x 1.8 cm with a craniocaudal dimension of 2.2 cm (volume equals 5.1 mL). There is severe mass effect on the fourth ventricle and cerebral aqueduct. No hydrocephalus. There is periventricular hypoattenuation compatible with chronic microvascular disease. Vascular: No abnormal hyperdensity of the major intracranial arteries or dural venous sinuses. No intracranial atherosclerosis. Skull: The visualized skull base, calvarium and extracranial soft tissues are normal. Sinuses/Orbits: No fluid levels or advanced mucosal thickening of the visualized paranasal sinuses. No mastoid or middle ear effusion. The orbits are normal. IMPRESSION: Acute intraparenchymal hematoma centered in the right cerebellar hemisphere with severe mass  effect on the fourth ventricle and cerebral aqueduct. No hydrocephalus. Electronically Signed: By: Ulyses Jarred M.D. On: 08/24/2022 23:42   DG Chest Port 1 View  Result Date: 08/25/2022 CLINICAL DATA:  Respiratory failure EXAM: PORTABLE CHEST 1 VIEW COMPARISON:  05/19/2022 FINDINGS: Endotracheal tube is in place 6.2 cm above the carina with its tip at the level of the clavicular heads. Lung volumes are small and there is right basilar atelectasis. No pneumothorax or pleural effusion. Mild cardiomegaly is present. Trace perihilar interstitial pulmonary edema noted. No acute bone abnormality. IMPRESSION: 1. Endotracheal tube at the thoracic inlet, 6.2 cm above the carina. 2. Pulmonary hypoinflation. 3. Trace perihilar interstitial pulmonary edema, possibly cardiogenic in nature. 4. Mild cardiomegaly. Electronically Signed   By: Fidela Salisbury M.D.   On: 08/25/2022 00:43            LOS: 40 days     Emeterio Reeve, DO [facility] 10/03/2022, 2:02 PM    Dictation software may have been used to generate the above note. Typos may occur and escape review in typed/dictated notes. Please contact Dr Sheppard Coil directly for clarity if needed.  Staff may message me via secure chat in [place]  but this may not receive an immediate response,  please page me for urgent matters!  If 7PM-7AM, please contact night coverage [email or web address]

## 2022-10-03 NOTE — Progress Notes (Addendum)
Received patient in bed to unit.  Alert and oriented.  Informed consent signed and in chart.   Treatment initiated: 1432 Treatment completed: 1810  Patient tolerated well. Pt became agitated towards end of tx. Was using foul language towards staff. Ended tx 5 min early. Transported back to the room  Alert, without acute distress.  Hand-off given to patient's nurse.   Access used: Catheter  Access issues: none  Total UF removed: 1400 Medication(s) given: none Post HD VS: 169/75,72,18,98.6 Post HD weight: 80.6   [facility]

## 2022-10-03 NOTE — Plan of Care (Signed)
  Problem: Nutrition: Goal: Adequate nutrition will be maintained Outcome: Progressing   Problem: Education: Goal: Knowledge of disease or condition will improve Outcome: Progressing

## 2022-10-03 NOTE — Plan of Care (Signed)
  Problem: Activity: Goal: Ability to tolerate increased activity will improve Outcome: Progressing   Problem: Respiratory: Goal: Ability to maintain a clear airway and adequate ventilation will improve Outcome: Progressing   Problem: Role Relationship: Goal: Method of communication will improve Outcome: Progressing   Problem: Education: Goal: Ability to describe self-care measures that may prevent or decrease complications (Diabetes Survival Skills Education) will improve Outcome: Progressing Goal: Individualized Educational Video(s) Outcome: Progressing   Problem: Coping: Goal: Ability to adjust to condition or change in health will improve Outcome: Progressing   Problem: Fluid Volume: Goal: Ability to maintain a balanced intake and output will improve Outcome: Progressing   Problem: Health Behavior/Discharge Planning: Goal: Ability to identify and utilize available resources and services will improve Outcome: Progressing Goal: Ability to manage health-related needs will improve Outcome: Progressing   Problem: Metabolic: Goal: Ability to maintain appropriate glucose levels will improve Outcome: Progressing   Problem: Nutritional: Goal: Maintenance of adequate nutrition will improve Outcome: Progressing Goal: Progress toward achieving an optimal weight will improve Outcome: Progressing   Problem: Skin Integrity: Goal: Risk for impaired skin integrity will decrease Outcome: Progressing   Problem: Tissue Perfusion: Goal: Adequacy of tissue perfusion will improve Outcome: Progressing   Problem: Safety: Goal: Non-violent Restraint(s) Outcome: Progressing   Problem: Education: Goal: Knowledge of General Education information will improve Description: Including pain rating scale, medication(s)/side effects and non-pharmacologic comfort measures Outcome: Progressing   Problem: Health Behavior/Discharge Planning: Goal: Ability to manage health-related needs will  improve Outcome: Progressing   Problem: Clinical Measurements: Goal: Ability to maintain clinical measurements within normal limits will improve Outcome: Progressing Goal: Will remain free from infection Outcome: Progressing Goal: Diagnostic test results will improve Outcome: Progressing Goal: Respiratory complications will improve Outcome: Progressing Goal: Cardiovascular complication will be avoided Outcome: Progressing   Problem: Activity: Goal: Risk for activity intolerance will decrease Outcome: Progressing   Problem: Nutrition: Goal: Adequate nutrition will be maintained Outcome: Progressing   Problem: Coping: Goal: Level of anxiety will decrease Outcome: Progressing   Problem: Elimination: Goal: Will not experience complications related to bowel motility Outcome: Progressing Goal: Will not experience complications related to urinary retention Outcome: Progressing   Problem: Pain Managment: Goal: General experience of comfort will improve Outcome: Progressing   Problem: Safety: Goal: Ability to remain free from injury will improve Outcome: Progressing   Problem: Skin Integrity: Goal: Risk for impaired skin integrity will decrease Outcome: Progressing   Problem: Education: Goal: Knowledge of disease or condition will improve Outcome: Progressing Goal: Knowledge of secondary prevention will improve (MUST DOCUMENT ALL) Outcome: Progressing Goal: Knowledge of patient specific risk factors will improve Elta [place] N/A or DELETE if not current risk factor) Outcome: Progressing

## 2022-10-03 NOTE — Progress Notes (Signed)
[facility] Progress Note   Subjective: Seen in room. Fiance at bedside. No issues reported overnight. HD later today.     Objective Vitals:   10/02/22 2005 10/02/22 2326 10/03/22 0334 10/03/22 0759  BP: (!) 143/64 (!) 146/65 (!) 158/74 (!) 147/87  Pulse: 74 75 67 81  Resp: '18 16 18 16  '$ Temp: 98.7 F (37.1 C) 97.9 F (36.6 C) (!) 97.5 F (36.4 C) (!) 97.4 F (36.3 C)  TempSrc: Oral Oral Oral Oral  SpO2: 98% 99% 99% 98%  Weight:      Height:       Physical Exam General: Chronically ill appearing male in NAD Heart: S1,S2 RRR No M/R/G Lungs: CTAB A/P Abdomen: soft, NABS Extremities:No LE edema Dialysis Access: L AVF + T/B RIJ TDC drsg intact  Dialysis Orders:  Additional Objective Labs: Basic Metabolic Panel: Recent Labs  Lab 09/27/22 0403 09/28/22 0341 09/29/22 0821 10/01/22 0343 10/02/22 0930  NA 130* 133* 131* 133* 131*  K 4.4 4.4 5.2* 5.5* 4.6  CL 90* 94* 92* 97* 96*  CO2 '22 25 22 23 26  '$ GLUCOSE 83 87 124* 85 175*  BUN 85* 45* 70* 60* 36*  CREATININE 9.65* 6.63* 9.57* 9.53* 6.76*  CALCIUM 8.9 9.0 8.9 9.0 8.5*  PHOS 5.2* 4.1 5.5*  --   --    Liver Function Tests: Recent Labs  Lab 09/28/22 0341 09/29/22 0821 10/01/22 0343  AST '23 20 17  '$ ALT '25 21 18  '$ ALKPHOS 78 68 64  BILITOT 0.3 0.5 0.3  PROT 7.4 7.2 6.7  ALBUMIN 3.3* 3.2* 3.0*   No results for input(s): "LIPASE", "AMYLASE" in the last 168 hours. CBC: Recent Labs  Lab 09/27/22 0403 09/28/22 0341 09/29/22 0821 10/01/22 0343  WBC 10.3 8.7 8.7 9.2  NEUTROABS 7.1 6.1 6.3  --   HGB 11.0* 10.8* 10.5* 10.1*  HCT 32.6* 34.1* 33.2* 30.5*  MCV 68.1* 70.7* 70.8* 69.6*  PLT 314 330 338 295   Blood Culture    Component Value Date/Time   SDES TRACHEAL ASPIRATE 08/26/2022 1212   SPECREQUEST NONE 08/26/2022 1212   CULT  08/26/2022 1212    ABUNDANT STREPTOCOCCUS ANGINOSIS RARE KLEBSIELLA OXYTOCA ABUNDANT HAEMOPHILUS INFLUENZAE BETA LACTAMASE NEGATIVE Performed at [facility] [address], [place], [place] [address]    REPTSTATUS 08/29/2022 FINAL 08/26/2022 1212    Cardiac Enzymes: No results for input(s): "CKTOTAL", "CKMB", "CKMBINDEX", "TROPONINI" in the last 168 hours. CBG: Recent Labs  Lab 10/02/22 0622 10/02/22 1116 10/02/22 1720 10/02/22 2359 10/03/22 0627  GLUCAP 203* 123* 146* 158* 102*   Iron Studies: No results for input(s): "IRON", "TIBC", "TRANSFERRIN", "FERRITIN" in the last 72 hours. '@lablastinr3'$ @ Studies/Results: No results found. Medications:  sodium chloride Stopped (08/30/22 0033)    amLODipine  5 mg Oral BID   carvedilol  25 mg Oral BID WC   Chlorhexidine Gluconate Cloth  6 each Topical Q0600   darbepoetin (ARANESP) injection - DIALYSIS  150 mcg Subcutaneous Q Mon-1800   feeding supplement (NEPRO CARB STEADY)  237 mL Oral TID WC   heparin injection (subcutaneous)  5,000 Units Subcutaneous Q8H   hydrALAZINE  100 mg Oral Q8H   insulin aspart  0-15 Units Subcutaneous Q6H   insulin glargine-yfgn  10 Units Subcutaneous Daily   multivitamin  1 tablet Oral QHS   mouth rinse  15 mL Mouth Rinse 4 times per day   pantoprazole  40 mg Oral BID   QUEtiapine  25 mg Oral QHS  sevelamer carbonate  800 mg Oral TID WC     Dialysis Orders: SW MWF 3h 32mn  80kg   400/800   2/2 bath  Hep 2000  LFA AVF - last HD 11/13, post 80.1kg - mircera 150 q2, last 11/13 - hectorol 462m IV q HD     Assessment/Plan: 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 S/P course of hypertonic fluids.  Now doing rehab, eating and talking.  AMS - due to #1. Has had significant improvement. Oriented X 2. Follows commands when prompted by fiance. HTN/ vol  - Volume appears stable. He is getting po BP meds x 3, BP controlled and stable. Avoid hypotension in HD. Volume seems fine.  ESRD - pt required CRRT 11/15-11/17. Resumed IH MWF. Next HD 10/03/2022. HD access - BUN was very high so access recirc was suspected. IR  did fistulogram 12/4 and AVF was found be small with competing veins. Using [place]IJ [place] placed by IR on 12/6. Consulted [facility]-seen by Dr. CaDonzetta MattersNo plans documented yet.  Anemia of ESRD: Hgb improved to 10.5, continue Aranesp 15057mweekly while here. Secondary HPTH: CCa ok. Renvela 3 ac tid as binder. Continue VDRA.  DM2 - per pmd Disposition: [place].     Jullian Clayson H. Briea Mcenery NP-C 10/03/2022, 10:38 AM  [phone]

## 2022-10-04 LAB — GLUCOSE, CAPILLARY
Glucose-Capillary: 123 mg/dL — ABNORMAL HIGH (ref 70–99)
Glucose-Capillary: 138 mg/dL — ABNORMAL HIGH (ref 70–99)
Glucose-Capillary: 172 mg/dL — ABNORMAL HIGH (ref 70–99)
Glucose-Capillary: 90 mg/dL (ref 70–99)

## 2022-10-04 MED ORDER — HYDROXYZINE HCL 25 MG PO TABS
50.0000 mg | ORAL_TABLET | Freq: Three times a day (TID) | ORAL | Status: AC | PRN
  Administered 2022-10-05 – 2022-10-06 (×3): 50 mg via ORAL
  Filled 2022-10-04 (×3): qty 2

## 2022-10-04 NOTE — Progress Notes (Signed)
PROGRESS NOTE    Randy Ross   OBS:[id number] DOB: [birth year]  DOA: 08/24/2022 Date of Service: 10/04/22 PCP: Medicine, [place] Narrative / Hospital Course:  Randy Ross is a 51 y.o. male with past medical history significant for ESRD on HD, DM2, essential hypertension who presented to [facility] ED with sudden onset slurred speech, headache.  Workup in the ER with elevated BP of 260/140 and CT head showing cerebellar hemorrhage.  Patient was initially intubated in the ED for airway protection and admitted to the PCCM/neurology service.    Significant events: 11/14: Admitted for acute cerebellar hemorrhage; intubated in the ER, started on hypertonic saline 11/15: Neurosurgery were consulted, recommended against surgical evacuation 11/16: Cefepime started for suspected aspiration 11/17: Required 1u PRBC transfusion due to CRRT filter 11/19: EEG nonspecific 11/21: MRI brain shows new scattered bilateral acute infarcts 11/22: Completed 7 days antibiotics 11/24: IR consulted given recirculation syndrome, high BUN; plan for fistulogram pending 11/27: Temp cath HD cath placed  11/30: Self-extubated 12/1: Persistent delirium 12/2: Transferred out of ICU to [facility] service 12/3: Pulled out cortrak 12/4: Failed SLP, needs cortrak, fistulogram shows poor outflow 12/5: TDC placed, feeding tube replaced 12/6: HD catheter placed by IR 12/9: Family meeting with palliative care  12/11: Family decided to continue aggressive measures, IR consulted for PEG tube 12/12: SLP requesting MBS prior to PEG tube placement; MBS likely plan for 12/12 12/13: MBS done and recommending dysphagia 1 diet with honey thick liquids  Today 12/25: Tolerating po intake. Waiting on rehab placement in [place] - will d/w [facility] tomorrow    Consultants:  Nephrology Neurosurgery  Vascular Surgery  IR Palliative Care   Procedures: See above       ASSESSMENT & PLAN:    Principal Problem:   Acute cerebellar hemorrhage (HCC) Active Problems:   Anemia   ESRD (end stage renal disease) ([place])   Diabetes mellitus ([place])   Acute respiratory failure with hypoxia ([place])   Hypertensive emergency   Pressure injury of skin   Acute metabolic encephalopathy and delirium   Cerebral edema (HCC)   Acute embolic stroke ([place])   Lower GI bleeding   Dysphagia   Palliative care by specialist    Acute Cerebellar hemorrhage 2/2 poorly controlled hypertension, cerebral edema  Patient presenting to ED via EMS with mental status change, left-sided weakness, slurred speech and left-sided gaze preference.  He was severely hypertensive on admission, was placed in the ICU and was intubated for airway protection, placed on Cleviprex infusion.  CT scan was notable for IPH right cerebellar hemisphere with mass effect.   He eventually improved, neurology signed off, and was transferred to the hospitalist service. Neurologically he has remained stable and he is stable for discharge. Awaiting insurance authorization for inpatient rehab in [facility], after that is obtained he will need HD spot approval in [place].  SW involved  Hyperkalemia Intermittent, d/t ESRD Monitor BMP periodically  Dysphagia, improving  initially requiring a core track, there were plans for PEG tube but he is improving, eating on his own, now does not need a PEG nor has the core track anymore.  Able to eat on his own Continue po diet    Lower GI Bleeding  Noted by nurse, transient, no change in hemoglobin.   No further bleeding reported.   Monitor hemoglobin, overall stable   Acute Embolic CVA  MR brain 47/65 with many new acute infarcts throughout the bilateral frontal and parietal  white matter consistent with embolism. TTE with LVEF 50-55%.  LDL 91, hemoglobin A1c 5.9.  Holding antiplatelets due to cerebellar hemorrhage.  Neurology now signed off; and do not recommend further evaluation for  cardiac source of embolism as he is not a good long-term anticoagulation candidate.   Hypertensive Emergency  BP was severely elevated on admission, likely leading to acute cerebellar hemorrhage.   Currently on amlodipine, Coreg, hydralazine.     Acute Respiratory Failure with Hypoxia - Resolved   Aspiration Pneumonia - completed antibiotics   Acute Metabolic Encephalopathy - better, but remains impulsive at times, trying to pull out his tunneled HD cath and requiring wrist restraint   Type 2 Diabetes Mellitus-Hemoglobin A1c 5.9 08/25/2022, well-controlled.   ESRD on HD, hyperkalemia.   Vascular consulted that he needs a fistula revision, this could be done as an outpatient.  For now he is having HD via  tunneled cath BMP today pending    Anemia of Chronic Medical/renal disease  Hemoglobin stable continue intermittent monitoring.    Hyponatremia -mild, stable  monitor    Clotted Fistula -Fistulogram showed poor outflow from fistula, nephrology consulted IR for HD catheter placement which was performed on 12/6.   DVT prophylaxis: SCD Pertinent IV fluids/nutrition: no continuous IV fluids, tolerating solid diet Central lines / invasive devices: dialysis tunneled catheter   Code Status: FULL CODE  Disposition: plan CIR TOC needs: CIR placement in [place] per family wishes  Barriers to discharge / significant pending items: see above - placement              Subjective:  Patient up in bed, eating lunch. No concerns from patient or RN.   Family Communication: none at this time     Objective Findings:  Vitals:   10/03/22 2051 10/03/22 2346 10/04/22 0800 10/04/22 1117  BP: (!) 144/83 (!) 156/80 127/76 (!) 142/67  Pulse:  85 88 79  Resp:  '17 16 16  '$ Temp:  98.2 F (36.8 C) 97.7 F (36.5 C) 98.3 F (36.8 C)  TempSrc:  Oral Oral Oral  SpO2:  98% 98% 98%  Weight:      Height:        Intake/Output Summary (Last 24 hours) at 10/04/2022 1327 Last data  filed at 10/04/2022 0810 Gross per 24 hour  Intake 440 ml  Output --  Net 440 ml    Filed Weights   09/29/22 0811 09/29/22 1224 10/03/22 1428  Weight: 77 kg 75.1 kg 82 kg    Examination: Physical Exam Constitutional:      General: He is not in acute distress.    Appearance: Normal appearance. He is normal weight.  HENT:     Head: Normocephalic and atraumatic.     Mouth/Throat:     Mouth: Mucous membranes are moist.  Cardiovascular:     Rate and Rhythm: Normal rate and regular rhythm.  Pulmonary:     Effort: No respiratory distress.     Breath sounds: Normal breath sounds.  Abdominal:     Palpations: Abdomen is soft.  Musculoskeletal:     Cervical back: Normal range of motion.  Skin:    General: Skin is warm and dry.  Neurological:     Mental Status: He is alert. Mental status is at baseline.  Psychiatric:        Mood and Affect: Mood normal.        Behavior: Behavior normal.           Scheduled Medications:  amLODipine  5 mg Oral BID   carvedilol  25 mg Oral BID WC   Chlorhexidine Gluconate Cloth  6 each Topical Q0600   darbepoetin (ARANESP) injection - DIALYSIS  150 mcg Subcutaneous Q Mon-1800   feeding supplement (NEPRO CARB STEADY)  237 mL Oral TID WC   heparin injection (subcutaneous)  5,000 Units Subcutaneous Q8H   hydrALAZINE  100 mg Oral Q8H   insulin aspart  0-15 Units Subcutaneous Q6H   insulin glargine-yfgn  10 Units Subcutaneous Daily   multivitamin  1 tablet Oral QHS   mouth rinse  15 mL Mouth Rinse 4 times per day   pantoprazole  40 mg Oral BID   QUEtiapine  25 mg Oral QHS   sevelamer carbonate  800 mg Oral TID WC    Continuous Infusions:  sodium chloride Stopped (08/30/22 0033)    PRN Medications:  sodium chloride, acetaminophen (TYLENOL) oral liquid 160 mg/5 mL, bisacodyl, fentaNYL (SUBLIMAZE) injection, hydrALAZINE, hydrOXYzine, labetalol, LORazepam, ondansetron (ZOFRAN) IV, mouth rinse  Antimicrobials:  Anti-infectives (From  admission, onward)    Start     Dose/Rate Route Frequency Ordered Stop   09/14/22 1145  ceFAZolin (ANCEF) IVPB 2g/100 mL premix        2 g 200 mL/hr over 30 Minutes Intravenous To Radiology 09/14/22 1123 09/15/22 0905   08/29/22 2000  cefTRIAXone (ROCEPHIN) 2 g in sodium chloride 0.9 % 100 mL IVPB        2 g 200 mL/hr over 30 Minutes Intravenous Every 24 hours 08/29/22 1009 09/01/22 2146   08/28/22 2000  ceFEPIme (MAXIPIME) 1 g in sodium chloride 0.9 % 100 mL IVPB  Status:  Discontinued        1 g 200 mL/hr over 30 Minutes Intravenous Every 24 hours 08/27/22 1009 08/27/22 1016   08/27/22 2000  ceFEPIme (MAXIPIME) 1 g in sodium chloride 0.9 % 100 mL IVPB  Status:  Discontinued        1 g 200 mL/hr over 30 Minutes Intravenous Every 24 hours 08/27/22 1016 08/29/22 1009   08/26/22 1000  ceFEPIme (MAXIPIME) 2 g in sodium chloride 0.9 % 100 mL IVPB  Status:  Discontinued        2 g 200 mL/hr over 30 Minutes Intravenous Every 12 hours 08/26/22 0850 08/27/22 1009           Data Reviewed: I have personally reviewed following labs and imaging studies  CBC: Recent Labs  Lab 09/28/22 0341 09/29/22 0821 10/01/22 0343  WBC 8.7 8.7 9.2  NEUTROABS 6.1 6.3  --   HGB 10.8* 10.5* 10.1*  HCT 34.1* 33.2* 30.5*  MCV 70.7* 70.8* 69.6*  PLT 330 338 062   Basic Metabolic Panel: Recent Labs  Lab 09/28/22 0341 09/29/22 0821 10/01/22 0343 10/02/22 0930  NA 133* 131* 133* 131*  K 4.4 5.2* 5.5* 4.6  CL 94* 92* 97* 96*  CO2 '25 22 23 26  '$ GLUCOSE 87 124* 85 175*  BUN 45* 70* 60* 36*  CREATININE 6.63* 9.57* 9.53* 6.76*  CALCIUM 9.0 8.9 9.0 8.5*  MG 2.0 2.0 1.9  --   PHOS 4.1 5.5*  --   --    GFR: Estimated Creatinine Clearance: 12.7 mL/min (A) (by C-G formula based on SCr of 6.76 mg/dL (H)). Liver Function Tests: Recent Labs  Lab 09/28/22 0341 09/29/22 0821 10/01/22 0343  AST '23 20 17  '$ ALT '25 21 18  '$ ALKPHOS 78 68 64  BILITOT 0.3 0.5 0.3  PROT 7.4 7.2 6.7  ALBUMIN 3.3* 3.2* 3.0*    No results for input(s): "LIPASE", "AMYLASE" in the last 168 hours. No results for input(s): "AMMONIA" in the last 168 hours. Coagulation Profile: No results for input(s): "INR", "PROTIME" in the last 168 hours. Cardiac Enzymes: No results for input(s): "CKTOTAL", "CKMB", "CKMBINDEX", "TROPONINI" in the last 168 hours. BNP (last 3 results) No results for input(s): "PROBNP" in the last 8760 hours. HbA1C: No results for input(s): "HGBA1C" in the last 72 hours. CBG: Recent Labs  Lab 10/03/22 1155 10/03/22 1857 10/03/22 2348 10/04/22 0618 10/04/22 1115  GLUCAP 151* 78 160* 90 123*   Lipid Profile: Recent Labs    10/02/22 0647  TRIG 79   Thyroid Function Tests: No results for input(s): "TSH", "T4TOTAL", "FREET4", "T3FREE", "THYROIDAB" in the last 72 hours. Anemia Panel: No results for input(s): "VITAMINB12", "FOLATE", "FERRITIN", "TIBC", "IRON", "RETICCTPCT" in the last 72 hours. Most Recent Urinalysis On File:     Component Value Date/Time   COLORURINE YELLOW 12/08/2021 1505   APPEARANCEUR HAZY (A) 12/08/2021 1505   LABSPEC 1.015 12/08/2021 1505   PHURINE 5.0 12/08/2021 1505   GLUCOSEU 150 (A) 12/08/2021 1505   HGBUR NEGATIVE 12/08/2021 1505   BILIRUBINUR NEGATIVE 12/08/2021 1505   KETONESUR NEGATIVE 12/08/2021 1505   PROTEINUR >=300 (A) 12/08/2021 1505   NITRITE NEGATIVE 12/08/2021 1505   LEUKOCYTESUR NEGATIVE 12/08/2021 1505   Sepsis Labs: '@LABRCNTIP'$ (procalcitonin:4,lacticidven:4)  No results found for this or any previous visit (from the past 240 hour(s)).       Radiology Studies: DG Chest Port 1 View  Result Date: 08/25/2022 CLINICAL DATA:  Central line placement. EXAM: PORTABLE CHEST 1 VIEW COMPARISON:  Chest 08/25/2022 FINDINGS: Endotracheal tube has been advanced now 1 cm above the carina. Feeding tube enters the stomach with the tip at the gastric antrum or duodenal bulb. Left jugular dual lumen catheter extends into the SVC at the cavoatrial  junction. No pneumothorax. Cardiac enlargement with mild vascular congestion unchanged. Mild bibasilar atelectasis unchanged. IMPRESSION: 1. Left jugular catheter tip at the cavoatrial junction. No pneumothorax. 2. Endotracheal tube 1 cm above the carina. 3. Mild bibasilar atelectasis unchanged. 4. Feeding tube tip in the region of the gastric antrum or duodenal bulb. Electronically Signed   By: Franchot Gallo M.D.   On: 08/25/2022 12:57   ECHOCARDIOGRAM COMPLETE  Result Date: 08/25/2022    ECHOCARDIOGRAM REPORT   Patient Name:   Ausar Georgiou Date of Exam: 08/25/2022 Medical Rec #:  [id number]         Height:       65.0 in Accession #:    [id number]        Weight:       177.2 lb Date of Birth:  01-25-1971        BSA:          1.879 m Patient Age:    33 years          BP:           136/59 mmHg Patient Gender: M                 HR:           62 bpm. Exam Location:  Inpatient Procedure: 2D Echo, Cardiac Doppler and Color Doppler Indications:    Stroke  History:        Patient has no prior history of Echocardiogram examinations.                 Risk Factors:Diabetes and  Hypertension. ESRD.  Sonographer:    Clayton Lefort RDCS (AE) Referring Phys: Rosalin Hawking  Sonographer Comments: Echo performed with patient supine and on artificial respirator and suboptimal subcostal window. IMPRESSIONS  1. Left ventricular ejection fraction, by estimation, is 50 to 55%. The left ventricle has low normal function. The left ventricle has no regional wall motion abnormalities. There is moderate left ventricular hypertrophy. Left ventricular diastolic parameters are consistent with Grade I diastolic dysfunction (impaired relaxation).  2. Right ventricular systolic function is normal. The right ventricular size is normal.  3. The mitral valve is normal in structure. No evidence of mitral valve regurgitation. No evidence of mitral stenosis.  4. The aortic valve is normal in structure. Aortic valve regurgitation is trivial. No aortic  stenosis is present.  5. The inferior vena cava is normal in size with greater than 50% respiratory variability, suggesting right atrial pressure of 3 mmHg. Conclusion(s)/Recommendation(s): No intracardiac source of embolism detected on this transthoracic study. Consider a transesophageal echocardiogram to exclude cardiac source of embolism if clinically indicated. FINDINGS  Left Ventricle: Left ventricular ejection fraction, by estimation, is 50 to 55%. The left ventricle has low normal function. The left ventricle has no regional wall motion abnormalities. The left ventricular internal cavity size was normal in size. There is moderate left ventricular hypertrophy. Left ventricular diastolic parameters are consistent with Grade I diastolic dysfunction (impaired relaxation). Right Ventricle: The right ventricular size is normal. No increase in right ventricular wall thickness. Right ventricular systolic function is normal. Left Atrium: Left atrial size was normal in size. Right Atrium: Right atrial size was normal in size. Pericardium: Trivial pericardial effusion is present. The pericardial effusion is posterior and lateral to the left ventricle. Mitral Valve: The mitral valve is normal in structure. No evidence of mitral valve regurgitation. No evidence of mitral valve stenosis. Tricuspid Valve: The tricuspid valve is normal in structure. Tricuspid valve regurgitation is trivial. No evidence of tricuspid stenosis. Aortic Valve: The aortic valve is normal in structure. Aortic valve regurgitation is trivial. No aortic stenosis is present. Aortic valve mean gradient measures 2.0 mmHg. Aortic valve peak gradient measures 3.7 mmHg. Aortic valve area, by VTI measures 3.76 cm. Pulmonic Valve: The pulmonic valve was normal in structure. Pulmonic valve regurgitation is not visualized. No evidence of pulmonic stenosis. Aorta: The aortic root is normal in size and structure. Venous: The inferior vena cava is normal in size  with greater than 50% respiratory variability, suggesting right atrial pressure of 3 mmHg. IAS/Shunts: No atrial level shunt detected by color flow Doppler.  LEFT VENTRICLE PLAX 2D LV PW:         1.88 cm   Diastology LV IVS:        1.36 cm   LV e' medial:    3.81 cm/s LVOT diam:     2.20 cm   LV E/e' medial:  17.5 LV SV:         69        LV e' lateral:   5.55 cm/s LV SV Index:   37        LV E/e' lateral: 12.0 LVOT Area:     3.80 cm  RIGHT VENTRICLE             IVC RV Basal diam:  2.80 cm     IVC diam: 1.80 cm RV S prime:     15.10 cm/s TAPSE (M-mode): 2.2 cm LEFT ATRIUM  Index        RIGHT ATRIUM           Index LA Vol (A2C):   42.8 ml 22.78 ml/m  RA Area:     10.80 cm LA Vol (A4C):   44.0 ml 23.41 ml/m  RA Volume:   17.70 ml  9.42 ml/m LA Biplane Vol: 45.9 ml 24.43 ml/m  AORTIC VALVE AV Area (Vmax):    3.67 cm AV Area (Vmean):   3.39 cm AV Area (VTI):     3.76 cm AV Vmax:           95.80 cm/s AV Vmean:          62.200 cm/s AV VTI:            0.183 m AV Peak Grad:      3.7 mmHg AV Mean Grad:      2.0 mmHg LVOT Vmax:         92.40 cm/s LVOT Vmean:        55.400 cm/s LVOT VTI:          0.181 m LVOT/AV VTI ratio: 0.99  AORTA Ao Root diam: 3.50 cm Ao Asc diam:  3.70 cm MITRAL VALVE MV Area (PHT): 2.48 cm    SHUNTS MV Decel Time: 306 msec    Systemic VTI:  0.18 m MV E velocity: 66.50 cm/s  Systemic Diam: 2.20 cm MV A velocity: 75.90 cm/s MV E/A ratio:  0.88 Candee Furbish MD Electronically signed by Candee Furbish MD Signature Date/Time: 08/25/2022/12:17:53 PM    Final    CT ANGIO HEAD NECK W WO CM (CODE STROKE)  Result Date: 08/25/2022 CLINICAL DATA:  Acute neuro deficit with stroke suspected. EXAM: CT ANGIOGRAPHY HEAD AND NECK TECHNIQUE: Multidetector CT imaging of the head and neck was performed using the standard protocol during bolus administration of intravenous contrast. Multiplanar CT image reconstructions and MIPs were obtained to evaluate the vascular anatomy. Carotid stenosis measurements  (when applicable) are obtained utilizing NASCET criteria, using the distal internal carotid diameter as the denominator. RADIATION DOSE REDUCTION: This exam was performed according to the departmental dose-optimization program which includes automated exposure control, adjustment of the mA and/or kV according to patient size and/or use of iterative reconstruction technique. CONTRAST:  67m OMNIPAQUE IOHEXOL 350 MG/ML SOLN COMPARISON:  Head CT from yesterday FINDINGS: CT HEAD FINDINGS Brain: Ovoid hematoma in the deep right cerebellum measuring 3.3 cm in length by 2.5 cm in maximal thickness, extending towards and likely into the fourth ventricle (triangular shape on sagittal reformats) but no intraventricular spread. Rim of adjacent edema without hydrocephalus. No visible infarct or mass. Vascular: See below Skull: Negative Sinuses/Orbits: Right cataract resection. Review of the MIP images confirms the above findings CTA NECK FINDINGS Aortic arch: No acute finding Right carotid system: Atheromatous plaque at the bifurcation without stenosis or ulceration. Left carotid system: Mild atheromatous plaque at the bifurcation. No stenosis or ulceration. Vertebral arteries: No proximal subclavian stenosis. The vertebral arteries are smoothly contoured and diffusely patent. Skeleton: Spinal curvature without acute finding Other neck: Tongue distortion from endotracheal tube. Upper chest: Atelectasis seen in the apical lungs Review of the MIP images confirms the above findings CTA HEAD FINDINGS Anterior circulation: Atheromatous calcification along the carotid siphons without flow reducing stenosis or aneurysm. Posterior circulation: Vertebral and basilar tortuosity with atheromatous calcification. No branch occlusion, beading, aneurysm, or vascular malformation. No spot sign Venous sinuses: Unremarkable for the arterial phase Anatomic variants: None significant Review of the MIP  images confirms the above findings IMPRESSION:  1. No vascular lesion or spot sign seen at the right cerebellar hemorrhage. The hemorrhage is size stable from earlier today. There is likely extension into the fourth ventricle but no intraventricular spread or hydrocephalus. 2. Premature atherosclerosis with notable vessel tortuosity suggesting chronic hypertension. Electronically Signed   By: Jorje Guild M.D.   On: 08/25/2022 06:10   CT HEAD CODE STROKE WO CONTRAST  Addendum Date: 08/25/2022   ADDENDUM REPORT: 08/25/2022 02:33 ADDENDUM: Critical Value/emergent results were called by telephone at the time of interpretation on 08/24/2022 at 11:58 pm to provider [facility] , who verbally acknowledged these results. Electronically Signed   By: Ulyses Jarred M.D.   On: 08/25/2022 02:33   Result Date: 08/25/2022 CLINICAL DATA:  Code stroke.  Acute neurologic deficits EXAM: CT HEAD WITHOUT CONTRAST TECHNIQUE: Contiguous axial images were obtained from the base of the skull through the vertex without intravenous contrast. RADIATION DOSE REDUCTION: This exam was performed according to the departmental dose-optimization program which includes automated exposure control, adjustment of the mA and/or kV according to patient size and/or use of iterative reconstruction technique. COMPARISON:  None Available. FINDINGS: Brain: There is an acute intraparenchymal hematoma centered in the right cerebellar hemisphere measuring 2.6 x 1.8 cm with a craniocaudal dimension of 2.2 cm (volume equals 5.1 mL). There is severe mass effect on the fourth ventricle and cerebral aqueduct. No hydrocephalus. There is periventricular hypoattenuation compatible with chronic microvascular disease. Vascular: No abnormal hyperdensity of the major intracranial arteries or dural venous sinuses. No intracranial atherosclerosis. Skull: The visualized skull base, calvarium and extracranial soft tissues are normal. Sinuses/Orbits: No fluid levels or advanced mucosal thickening of the  visualized paranasal sinuses. No mastoid or middle ear effusion. The orbits are normal. IMPRESSION: Acute intraparenchymal hematoma centered in the right cerebellar hemisphere with severe mass effect on the fourth ventricle and cerebral aqueduct. No hydrocephalus. Electronically Signed: By: Ulyses Jarred M.D. On: 08/24/2022 23:42   DG Chest Port 1 View  Result Date: 08/25/2022 CLINICAL DATA:  Respiratory failure EXAM: PORTABLE CHEST 1 VIEW COMPARISON:  05/19/2022 FINDINGS: Endotracheal tube is in place 6.2 cm above the carina with its tip at the level of the clavicular heads. Lung volumes are small and there is right basilar atelectasis. No pneumothorax or pleural effusion. Mild cardiomegaly is present. Trace perihilar interstitial pulmonary edema noted. No acute bone abnormality. IMPRESSION: 1. Endotracheal tube at the thoracic inlet, 6.2 cm above the carina. 2. Pulmonary hypoinflation. 3. Trace perihilar interstitial pulmonary edema, possibly cardiogenic in nature. 4. Mild cardiomegaly. Electronically Signed   By: Fidela Salisbury M.D.   On: 08/25/2022 00:43            LOS: 41 days     Emeterio Reeve, DO [facility] 10/04/2022, 1:27 PM    Dictation software may have been used to generate the above note. Typos may occur and escape review in typed/dictated notes. Please contact Dr Sheppard Coil directly for clarity if needed.  Staff may message me via secure chat in [place]  but this may not receive an immediate response,  please page me for urgent matters!  If 7PM-7AM, please contact night coverage [email or web address]

## 2022-10-05 LAB — CBC
HCT: 33.5 % — ABNORMAL LOW (ref 39.0–52.0)
Hemoglobin: 10.6 g/dL — ABNORMAL LOW (ref 13.0–17.0)
MCH: 22.7 pg — ABNORMAL LOW (ref 26.0–34.0)
MCHC: 31.6 g/dL (ref 30.0–36.0)
MCV: 71.9 fL — ABNORMAL LOW (ref 80.0–100.0)
Platelets: 325 10*3/uL (ref 150–400)
RBC: 4.66 MIL/uL (ref 4.22–5.81)
RDW: 19.7 % — ABNORMAL HIGH (ref 11.5–15.5)
WBC: 16.1 10*3/uL — ABNORMAL HIGH (ref 4.0–10.5)
nRBC: 0 % (ref 0.0–0.2)

## 2022-10-05 LAB — GLUCOSE, CAPILLARY
Glucose-Capillary: 142 mg/dL — ABNORMAL HIGH (ref 70–99)
Glucose-Capillary: 150 mg/dL — ABNORMAL HIGH (ref 70–99)
Glucose-Capillary: 179 mg/dL — ABNORMAL HIGH (ref 70–99)
Glucose-Capillary: 91 mg/dL (ref 70–99)

## 2022-10-05 LAB — TRIGLYCERIDES: Triglycerides: 56 mg/dL (ref <150)

## 2022-10-05 NOTE — Progress Notes (Signed)
Physical Therapy Treatment Patient Details Name: Randy Ross MRN: [id number] DOB: [birth year] Today's Date: 10/05/2022   History of Present Illness 51 y.o. male presented 08/24/22 with slurred speech and headache. Head CT Rt cerebellar hemorrhage; intubated 11/14, self-extubated 11/30; CRRT; MRI brain 11/21 many small acute infarcts; PMH significant for end-stage renal disease on intermittent hemodialysis, diabetes, hypertension    PT Comments    Pt greeted sidelying in bed and agreeable to session with continued progress towards acute goals. Pt able to demonstrate ambulation without AD however pt continues to be very unsteady with x2 LOB to left with pt able to correct with mod assist and multiple instances of veering L and scissoring feet. Pt stability improved with RW use with pt requiring min guard for safety with no LOB noted. Pt with fait tolerance for LE therex for increased strength and ROM. Current plan remains appropriate to address deficits and maximize functional independence and decrease caregiver burden. Pt continues to benefit from skilled PT services to progress toward functional mobility goals.    Recommendations for follow up therapy are one component of a multi-disciplinary discharge planning process, led by the attending physician.  Recommendations may be updated based on patient status, additional functional criteria and insurance authorization.  Follow Up Recommendations  Acute inpatient rehab (3hours/day) Can patient physically be transported by private vehicle: Yes   Assistance Recommended at Discharge Frequent or constant Supervision/Assistance  Patient can return home with the following Assistance with cooking/housework;Assistance with feeding;Direct supervision/assist for medications management;Direct supervision/assist for financial management;Assist for transportation;Help with stairs or ramp for entrance;A lot of help with walking and/or transfers;A lot of help  with bathing/dressing/bathroom   Equipment Recommendations  None recommended by PT    Recommendations for Other Services       Precautions / Restrictions Precautions Precautions: Fall Restrictions Weight Bearing Restrictions: No     Mobility  Bed Mobility Overal bed mobility: Needs Assistance Bed Mobility: Supine to Sit, Sit to Supine     Supine to sit: Supervision, HOB elevated Sit to supine: Supervision   General bed mobility comments: close supervision for safety    Transfers Overall transfer level: Needs assistance Equipment used: Rolling walker (2 wheels), None Transfers: Sit to/from Stand Sit to Stand: Min guard           General transfer comment: min guard for safety, cues for hand placement    Ambulation/Gait Ambulation/Gait assistance: Min assist, Min guard Gait Distance (Feet): 150 Feet (+40) Assistive device: Rolling walker (2 wheels), None Gait Pattern/deviations: Step-through pattern, Decreased stride length, Trunk flexed Gait velocity: decr     General Gait Details: pt pushing RW too far ahead, but would make corrections when cued verbally; assist during turn for maneuvering RW and for balance, increased asssit without AD as pt with scissoring steps and LOB laterally L with pt able to self correct   Stairs             Wheelchair Mobility    Modified Rankin (Stroke Patients Only) Modified Rankin (Stroke Patients Only) Pre-Morbid Rankin Score: No symptoms Modified Rankin: Moderately severe disability     Balance Overall balance assessment: Needs assistance Sitting-balance support: No upper extremity supported, Feet unsupported Sitting balance-Leahy Scale: Fair Sitting balance - Comments: Min guard A for safety   Standing balance support: Single extremity supported, During functional activity Standing balance-Leahy Scale: Fair Standing balance comment: able to stand statically without assist, BUE using RW needed for mobility  Cognition Arousal/Alertness: Awake/alert Behavior During Therapy: Flat affect Overall Cognitive Status: Difficult to assess Area of Impairment: Attention, Following commands, Safety/judgement, Awareness, Problem solving                   Current Attention Level: Sustained   Following Commands: Follows one step commands with increased time Safety/Judgement: Decreased awareness of safety, Decreased awareness of deficits Awareness: Intellectual Problem Solving: Slow processing, Difficulty sequencing, Requires verbal cues General Comments: difficult to fully assess as pt not answering questions, responding to therapist during session. Follows one step commands with increased time. pt family present and endorsing pt more like himself close to baseline and joking with them thoughout day        Exercises Other Exercises Other Exercises: STS x5 Other Exercises: LAQ x5 ea side, knee flexion x5 ea side, all with graded manual resistance    General Comments General comments (skin integrity, edema, etc.): mother and spouse present      Pertinent Vitals/Pain Pain Assessment Pain Assessment: No/denies pain Pain Intervention(s): Monitored during session    Home Living                          Prior Function            PT Goals (current goals can now be found in the care plan section) Acute Rehab PT Goals PT Goal Formulation: With patient Time For Goal Achievement: 10/11/22 Progress towards PT goals: Progressing toward goals    Frequency    Min 3X/week      PT Plan Discharge plan needs to be updated;Frequency needs to be updated    Co-evaluation              AM-PAC PT "6 Clicks" Mobility   Outcome Measure  Help needed turning from your back to your side while in a flat bed without using bedrails?: A Little Help needed moving from lying on your back to sitting on the side of a flat bed without using bedrails?: A  Little Help needed moving to and from a bed to a chair (including a wheelchair)?: A Little Help needed standing up from a chair using your arms (e.g., wheelchair or bedside chair)?: A Little Help needed to walk in hospital room?: A Little Help needed climbing 3-5 steps with a railing? : Total 6 Click Score: 16    End of Session Equipment Utilized During Treatment: Gait belt Activity Tolerance: Patient tolerated treatment well Patient left: in bed;with bed alarm set;with call bell/phone within reach;with family/visitor present Nurse Communication: Mobility status PT Visit Diagnosis: Other abnormalities of gait and mobility (R26.89);Other symptoms and signs involving the nervous system (O27.035)     Time: 1422-1440 PT Time Calculation (min) (ACUTE ONLY): 18 min  Charges:  $Gait Training: 8-22 mins                    Suleyma Wafer R. PTA Acute Rehabilitation Services Office: [place] 10/05/2022, 2:50 PM

## 2022-10-05 NOTE — Progress Notes (Signed)
Contacted [name] with [facility] to request an update on clinic placement in [place] should insurance approve rehab at [facility]. Randy Ross is working on locating a clinic. Will update [name] on [facility] for rehab once that has been determined.   [name] Renal Navigator [phone]

## 2022-10-05 NOTE — Progress Notes (Signed)
PROGRESS NOTE    Randy Ross   NKN:[id number] DOB: 09-26-1971  DOA: 08/24/2022 Date of Service: 10/05/22 PCP: Medicine, [place] Narrative / Hospital Course:  Randy Ross is a 51 y.o. male with past medical history significant for ESRD on HD, DM2, essential hypertension who presented to [facility] ED with sudden onset slurred speech, headache.  Workup in the ER with elevated BP of 260/140 and CT head showing cerebellar hemorrhage.  Patient was initially intubated in the ED for airway protection and admitted to the PCCM/neurology service.    Significant events: 11/14: Admitted for acute cerebellar hemorrhage; intubated in the ER, started on hypertonic saline 11/15: Neurosurgery were consulted, recommended against surgical evacuation 11/16: Cefepime started for suspected aspiration 11/17: Required 1u PRBC transfusion due to CRRT filter 11/19: EEG nonspecific 11/21: MRI brain shows new scattered bilateral acute infarcts 11/22: Completed 7 days antibiotics 11/24: IR consulted given recirculation syndrome, high BUN; plan for fistulogram pending 11/27: Temp cath HD cath placed  11/30: Self-extubated 12/1: Persistent delirium 12/2: Transferred out of ICU to [facility] service 12/3: Pulled out cortrak 12/4: Failed SLP, needs cortrak, fistulogram shows poor outflow 12/5: TDC placed, feeding tube replaced 12/6: HD catheter placed by IR 12/9: Family meeting with palliative care  12/11: Family decided to continue aggressive measures, IR consulted for PEG tube 12/12: SLP requesting MBS prior to PEG tube placement; MBS likely plan for 12/12 12/13: MBS done and recommending dysphagia 1 diet with honey thick liquids  Today 12/26: Tolerating po intake. Waiting on rehab placement in [facility]    Consultants:  Nephrology Neurosurgery  Vascular Surgery  IR Palliative Care   Procedures: See above       ASSESSMENT & PLAN:   Principal Problem:    Acute cerebellar hemorrhage (HCC) Active Problems:   Anemia   ESRD (end stage renal disease) ([place])   Diabetes mellitus ([place])   Acute respiratory failure with hypoxia (HCC)   Hypertensive emergency   Pressure injury of skin   Acute metabolic encephalopathy and delirium   Cerebral edema (HCC)   Acute embolic stroke ([place])   Lower GI bleeding   Dysphagia   Palliative care by specialist    Acute Cerebellar hemorrhage 2/2 poorly controlled hypertension, cerebral edema  Patient presenting to ED via EMS with mental status change, left-sided weakness, slurred speech and left-sided gaze preference.  He was severely hypertensive on admission, was placed in the ICU and was intubated for airway protection, placed on Cleviprex infusion.  CT scan was notable for IPH right cerebellar hemisphere with mass effect.   He eventually improved, neurology signed off, and was transferred to the hospitalist service. Neurologically he has remained stable and he is stable for discharge. Awaiting insurance authorization for inpatient rehab in [facility], after that is obtained he will need HD spot approval in [place].  SW involved  Hyperkalemia Intermittent, d/t ESRD Monitor BMP periodically  Dysphagia, improving  initially requiring a core track, there were plans for PEG tube but he is improving, eating on his own, now does not need a PEG nor has the core track anymore.  Able to eat on his own Continue po diet    Lower GI Bleeding  Noted by nurse, transient, no change in hemoglobin.   No further bleeding reported.   Monitor hemoglobin, overall stable   Acute Embolic CVA  MR brain 37/90 with many new acute infarcts throughout the bilateral frontal and parietal white matter consistent with embolism.  TTE with LVEF 50-55%.  LDL 91, hemoglobin A1c 5.9.  Holding antiplatelets due to cerebellar hemorrhage.  Neurology now signed off; and do not recommend further evaluation for cardiac source of embolism as  he is not a good long-term anticoagulation candidate.   Hypertensive Emergency  BP was severely elevated on admission, likely leading to acute cerebellar hemorrhage.   Currently on amlodipine, Coreg, hydralazine.     Acute Respiratory Failure with Hypoxia - Resolved   Aspiration Pneumonia - completed antibiotics   Acute Metabolic Encephalopathy - better, but remains impulsive at times, trying to pull out his tunneled HD cath and requiring wrist restraint   Type 2 Diabetes Mellitus-Hemoglobin A1c 5.9 08/25/2022, well-controlled.   ESRD on HD, hyperkalemia.   Vascular consulted that he needs a fistula revision, this could be done as an outpatient.  For now he is having HD via  tunneled cath BMP today pending    Anemia of Chronic Medical/renal disease  Hemoglobin stable continue intermittent monitoring.    Hyponatremia -mild, stable  monitor    Clotted Fistula -Fistulogram showed poor outflow from fistula, nephrology consulted IR for HD catheter placement which was performed on 12/6.   DVT prophylaxis: SCD Pertinent IV fluids/nutrition: no continuous IV fluids, tolerating solid diet Central lines / invasive devices: dialysis tunneled catheter   Code Status: FULL CODE  Disposition: plan CIR TOC needs: CIR placement in [place] per family wishes - in process  Barriers to discharge / significant pending items: see above - placement              Subjective:  Patient up in bed, eating lunch. No concerns from patient or RN.   Family Communication: none at this time     Objective Findings:  Vitals:   10/04/22 2343 10/05/22 0357 10/05/22 0805 10/05/22 1128  BP: 138/69 (!) 164/80 (!) 126/91 (!) 141/86  Pulse: 77 66 76 72  Resp: '18 16 14 20  '$ Temp: 97.8 F (36.6 C) 97.8 F (36.6 C) 98.3 F (36.8 C) 97.9 F (36.6 C)  TempSrc: Oral Oral Oral Oral  SpO2: 99% 100% 99% 93%  Weight:      Height:        Intake/Output Summary (Last 24 hours) at 10/05/2022  1410 Last data filed at 10/05/2022 1300 Gross per 24 hour  Intake 1140 ml  Output --  Net 1140 ml    Filed Weights   09/29/22 0811 09/29/22 1224 10/03/22 1428  Weight: 77 kg 75.1 kg 82 kg    Examination: Physical Exam Constitutional:      General: He is not in acute distress.    Appearance: Normal appearance. He is normal weight.  Cardiovascular:     Rate and Rhythm: Normal rate and regular rhythm.  Pulmonary:     Effort: No respiratory distress.     Breath sounds: Normal breath sounds.           Scheduled Medications:   amLODipine  5 mg Oral BID   carvedilol  25 mg Oral BID WC   Chlorhexidine Gluconate Cloth  6 each Topical Q0600   darbepoetin (ARANESP) injection - DIALYSIS  150 mcg Subcutaneous Q Mon-1800   feeding supplement (NEPRO CARB STEADY)  237 mL Oral TID WC   heparin injection (subcutaneous)  5,000 Units Subcutaneous Q8H   hydrALAZINE  100 mg Oral Q8H   insulin aspart  0-15 Units Subcutaneous Q6H   insulin glargine-yfgn  10 Units Subcutaneous Daily   multivitamin  1 tablet Oral QHS  mouth rinse  15 mL Mouth Rinse 4 times per day   pantoprazole  40 mg Oral BID   QUEtiapine  25 mg Oral QHS   sevelamer carbonate  800 mg Oral TID WC    Continuous Infusions:  sodium chloride Stopped (08/30/22 0033)    PRN Medications:  sodium chloride, acetaminophen (TYLENOL) oral liquid 160 mg/5 mL, bisacodyl, fentaNYL (SUBLIMAZE) injection, hydrALAZINE, hydrOXYzine, labetalol, LORazepam, ondansetron (ZOFRAN) IV, mouth rinse  Antimicrobials:  None currently        Data Reviewed: I have personally reviewed following labs and imaging studies  CBC: Recent Labs  Lab 09/29/22 0821 10/01/22 0343 10/05/22 0501  WBC 8.7 9.2 16.1*  NEUTROABS 6.3  --   --   HGB 10.5* 10.1* 10.6*  HCT 33.2* 30.5* 33.5*  MCV 70.8* 69.6* 71.9*  PLT 338 295 161   Basic Metabolic Panel: Recent Labs  Lab 09/29/22 0821 10/01/22 0343 10/02/22 0930  NA 131* 133* 131*  K 5.2*  5.5* 4.6  CL 92* 97* 96*  CO2 '22 23 26  '$ GLUCOSE 124* 85 175*  BUN 70* 60* 36*  CREATININE 9.57* 9.53* 6.76*  CALCIUM 8.9 9.0 8.5*  MG 2.0 1.9  --   PHOS 5.5*  --   --    GFR: Estimated Creatinine Clearance: 12.7 mL/min (A) (by C-G formula based on SCr of 6.76 mg/dL (H)). Liver Function Tests: Recent Labs  Lab 09/29/22 0821 10/01/22 0343  AST 20 17  ALT 21 18  ALKPHOS 68 64  BILITOT 0.5 0.3  PROT 7.2 6.7  ALBUMIN 3.2* 3.0*   No results for input(s): "LIPASE", "AMYLASE" in the last 168 hours. No results for input(s): "AMMONIA" in the last 168 hours. Coagulation Profile: No results for input(s): "INR", "PROTIME" in the last 168 hours. Cardiac Enzymes: No results for input(s): "CKTOTAL", "CKMB", "CKMBINDEX", "TROPONINI" in the last 168 hours. BNP (last 3 results) No results for input(s): "PROBNP" in the last 8760 hours. HbA1C: No results for input(s): "HGBA1C" in the last 72 hours. CBG: Recent Labs  Lab 10/04/22 1115 10/04/22 1721 10/04/22 2343 10/05/22 0610 10/05/22 1128  GLUCAP 123* 172* 138* 91 179*   Lipid Profile: Recent Labs    10/05/22 0501  TRIG 56   Thyroid Function Tests: No results for input(s): "TSH", "T4TOTAL", "FREET4", "T3FREE", "THYROIDAB" in the last 72 hours. Anemia Panel: No results for input(s): "VITAMINB12", "FOLATE", "FERRITIN", "TIBC", "IRON", "RETICCTPCT" in the last 72 hours. Most Recent Urinalysis On File:     Component Value Date/Time   COLORURINE YELLOW 12/08/2021 1505   APPEARANCEUR HAZY (A) 12/08/2021 1505   LABSPEC 1.015 12/08/2021 1505   PHURINE 5.0 12/08/2021 1505   GLUCOSEU 150 (A) 12/08/2021 1505   HGBUR NEGATIVE 12/08/2021 1505   BILIRUBINUR NEGATIVE 12/08/2021 1505   KETONESUR NEGATIVE 12/08/2021 1505   PROTEINUR >=300 (A) 12/08/2021 1505   NITRITE NEGATIVE 12/08/2021 1505   LEUKOCYTESUR NEGATIVE 12/08/2021 1505   Sepsis Labs: '@LABRCNTIP'$ (procalcitonin:4,lacticidven:4)  No results found for this or any previous  visit (from the past 240 hour(s)).       Radiology Studies past 48 hours: No results found.      LOS: 71 days     Emeterio Reeve, DO [facility] 10/05/2022, 2:10 PM    Dictation software may have been used to generate the above note. Typos may occur and escape review in typed/dictated notes. Please contact Dr Sheppard Coil directly for clarity if needed.  Staff may message me via secure chat in [place]  but this  may not receive an immediate response,  please page me for urgent matters!  If 7PM-7AM, please contact night coverage [email or web address]

## 2022-10-05 NOTE — TOC Progression Note (Addendum)
Transition of Care [facility]) - Progression Note    Patient Details  Name: Randy Ross MRN: [id number] Date of Birth: 1971-06-25  Transition of [facility]) CM/SW Contact  Jinger Neighbors, [place] Phone Number: 10/05/2022, 2:49 PM  Clinical Narrative:     CSW called Crystal at [facility] to inquire about authorization status. Crystal stated she is going to have [place] call CSW to further discuss.  CSW received a call from [place] who reports authorization is still pending.   Expected Discharge Plan: [place] Barriers to Discharge: Continued Medical Work up  Expected Discharge Plan and Services       Living arrangements for the past 2 months: Single Family Home                                       Social Determinants of Health (SDOH) Interventions SDOH Screenings   Tobacco Use: High Risk (09/15/2022)    Readmission Risk Interventions     No data to display

## 2022-10-05 NOTE — Progress Notes (Signed)
[facility] Progress Note   Subjective: Seen in room. Resting quietly. Fiance confused about HD days on Holiday Schedule, expected him to have HD today. Educated SO on holiday schedule. Awaiting transfer to OP SNF for rehab.   Objective Vitals:   10/04/22 2038 10/04/22 2343 10/05/22 0357 10/05/22 0805  BP: (!) 141/71 138/69 (!) 164/80 (!) 126/91  Pulse: 78 77 66 76  Resp: '18 18 16 14  '$ Temp: 98.7 F (37.1 C) 97.8 F (36.6 C) 97.8 F (36.6 C) 98.3 F (36.8 C)  TempSrc: Oral Oral Oral Oral  SpO2: 100% 99% 100% 99%  Weight:      Height:       Physical Exam General: Chronically ill appearing male in NAD Heart: S1,S2 RRR No M/R/G Lungs: CTAB A/P Abdomen: soft, NABS Extremities:No LE edema Dialysis Access: L AVF + T/B RIJ TDC drsg intact   Additional Objective Labs: Basic Metabolic Panel: Recent Labs  Lab 09/29/22 0821 10/01/22 0343 10/02/22 0930  NA 131* 133* 131*  K 5.2* 5.5* 4.6  CL 92* 97* 96*  CO2 '22 23 26  '$ GLUCOSE 124* 85 175*  BUN 70* 60* 36*  CREATININE 9.57* 9.53* 6.76*  CALCIUM 8.9 9.0 8.5*  PHOS 5.5*  --   --    Liver Function Tests: Recent Labs  Lab 09/29/22 0821 10/01/22 0343  AST 20 17  ALT 21 18  ALKPHOS 68 64  BILITOT 0.5 0.3  PROT 7.2 6.7  ALBUMIN 3.2* 3.0*   No results for input(s): "LIPASE", "AMYLASE" in the last 168 hours. CBC: Recent Labs  Lab 09/29/22 0821 10/01/22 0343 10/05/22 0501  WBC 8.7 9.2 16.1*  NEUTROABS 6.3  --   --   HGB 10.5* 10.1* 10.6*  HCT 33.2* 30.5* 33.5*  MCV 70.8* 69.6* 71.9*  PLT 338 295 325   Blood Culture    Component Value Date/Time   SDES TRACHEAL ASPIRATE 08/26/2022 1212   SPECREQUEST NONE 08/26/2022 1212   CULT  08/26/2022 1212    ABUNDANT STREPTOCOCCUS ANGINOSIS RARE KLEBSIELLA OXYTOCA ABUNDANT HAEMOPHILUS INFLUENZAE BETA LACTAMASE NEGATIVE Performed at [facility], [place] [address], [place], [place] [address]    REPTSTATUS 08/29/2022 FINAL 08/26/2022 1212     Cardiac Enzymes: No results for input(s): "CKTOTAL", "CKMB", "CKMBINDEX", "TROPONINI" in the last 168 hours. CBG: Recent Labs  Lab 10/04/22 0618 10/04/22 1115 10/04/22 1721 10/04/22 2343 10/05/22 0610  GLUCAP 90 123* 172* 138* 91   Iron Studies: No results for input(s): "IRON", "TIBC", "TRANSFERRIN", "FERRITIN" in the last 72 hours. '@lablastinr3'$ @ Studies/Results: No results found. Medications:  sodium chloride Stopped (08/30/22 0033)    amLODipine  5 mg Oral BID   carvedilol  25 mg Oral BID WC   Chlorhexidine Gluconate Cloth  6 each Topical Q0600   darbepoetin (ARANESP) injection - DIALYSIS  150 mcg Subcutaneous Q Mon-1800   feeding supplement (NEPRO CARB STEADY)  237 mL Oral TID WC   heparin injection (subcutaneous)  5,000 Units Subcutaneous Q8H   hydrALAZINE  100 mg Oral Q8H   insulin aspart  0-15 Units Subcutaneous Q6H   insulin glargine-yfgn  10 Units Subcutaneous Daily   multivitamin  1 tablet Oral QHS   mouth rinse  15 mL Mouth Rinse 4 times per day   pantoprazole  40 mg Oral BID   QUEtiapine  25 mg Oral QHS   sevelamer carbonate  800 mg Oral TID WC     Dialysis Orders: SW MWF 3h 61mn  80kg   400/800  2/2 bath  Hep 2000  LFA AVF - last HD 11/13, post 80.1kg - mircera 150 q2, last 11/13 - hectorol 40mg IV q HD     Assessment/Plan: 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 S/P course of hypertonic fluids.  Now doing rehab, eating and talking.  AMS - due to #1. Has had significant improvement. Oriented X 2. Follows commands when prompted by fiance. HTN/ vol  - Volume appears stable. He is getting po BP meds x 3, BP controlled and stable. Avoid hypotension in HD. Volume seems fine.  ESRD - pt required CRRT 11/15-11/17. Resumed IH MWF. Next HD 10/06/2022. HD access - BUN was very high so access recirc was suspected. IR did fistulogram 12/4 and AVF was found be small with competing veins. Using RIJ TDublinnow placed by IR on  12/6. Consulted [facility]-seen by Dr. CDonzetta Matters No plans documented yet.  Anemia of ESRD: Hgb improved to 10.5, continue Aranesp 158m weekly while here. Secondary HPTH: CCa ok. Renvela 3 ac tid as binder. Continue VDRA.  DM2 - per pmd [place].   Stefana Lodico H. Elijio Staples NP-C 10/05/2022, 11:04 AM  [phone]

## 2022-10-05 NOTE — Progress Notes (Signed)
Nutrition Follow-up  DOCUMENTATION CODES:   Not applicable  INTERVENTION:  - Continue current diet order.   NUTRITION DIAGNOSIS:   Inadequate oral intake related to inability to eat as evidenced by NPO status.  GOAL:   Patient will meet greater than or equal to 90% of their needs  MONITOR:   Vent status, Labs, Weight trends, TF tolerance, I & O's  REASON FOR ASSESSMENT:   Consult Calorie Count  ASSESSMENT:   51 year old male who presented to the ED on 11/14 as a Code Stroke with AMS, L sided weakness, slurred speech, L sided gaze preference. PMH of ESRD on HD, DM, HTN. Pt required intubation in the ED. Pt admitted with ICH.  Meds reviewed: sliding scale insulin, semglee, MVI. Labs reviewed: Na low, BUN/Creatinine elevated.   Pt is oriented x2. Pt ate 100% of his breakfast and lunch today. Per record, pt has eaten 80-100% of his meals over the past week. Pt is meeting his needs at this time. RD will continue to monitor PO intakes.   Diet Order:   Diet Order             Diet regular Room service appropriate? Yes with Assist; Fluid consistency: Thin  Diet effective now                   EDUCATION NEEDS:   No education needs have been identified at this time  Skin:  Skin Assessment: Skin Integrity Issues: Skin Integrity Issues:: Stage II Stage II: sacrum  Last BM:  10/04/22  Height:   Ht Readings from Last 1 Encounters:  08/25/22 '5\' 5"'$  (1.651 m)    Weight:   Wt Readings from Last 1 Encounters:  10/03/22 82 kg    Ideal Body Weight:  61.8 kg  BMI:  Body mass index is 30.08 kg/m.  Estimated Nutritional Needs:   Kcal:  2000-2200 kcal/d  Protein:  100-125g/d  Fluid:  1000 ml + UOP  Keshia Weare Graciela Husbands, RD, LDN, CNSC.

## 2022-10-06 LAB — GLUCOSE, CAPILLARY
Glucose-Capillary: 85 mg/dL (ref 70–99)
Glucose-Capillary: 248 mg/dL — ABNORMAL HIGH (ref 70–99)
Glucose-Capillary: 156 mg/dL — ABNORMAL HIGH (ref 70–99)

## 2022-10-06 LAB — CBC
HCT: 29.6 % — ABNORMAL LOW (ref 39.0–52.0)
Hemoglobin: 10 g/dL — ABNORMAL LOW (ref 13.0–17.0)
MCH: 23.4 pg — ABNORMAL LOW (ref 26.0–34.0)
MCHC: 33.8 g/dL (ref 30.0–36.0)
MCV: 69.2 fL — ABNORMAL LOW (ref 80.0–100.0)
Platelets: 287 10*3/uL (ref 150–400)
RBC: 4.28 MIL/uL (ref 4.22–5.81)
RDW: 19.3 % — ABNORMAL HIGH (ref 11.5–15.5)
WBC: 8.7 10*3/uL (ref 4.0–10.5)
nRBC: 0 % (ref 0.0–0.2)

## 2022-10-06 LAB — BASIC METABOLIC PANEL
Anion gap: 14 (ref 5–15)
BUN: 75 mg/dL — ABNORMAL HIGH (ref 6–20)
CO2: 23 mmol/L (ref 22–32)
Calcium: 8.7 mg/dL — ABNORMAL LOW (ref 8.9–10.3)
Chloride: 101 mmol/L (ref 98–111)
Creatinine, Ser: 10.85 mg/dL — ABNORMAL HIGH (ref 0.61–1.24)
GFR, Estimated: 5 mL/min — ABNORMAL LOW (ref >60)
Glucose, Bld: 104 mg/dL — ABNORMAL HIGH (ref 70–99)
Potassium: 5.2 mmol/L — ABNORMAL HIGH (ref 3.5–5.1)
Sodium: 138 mmol/L (ref 135–145)

## 2022-10-06 LAB — RENAL FUNCTION PANEL
Albumin: 2.9 g/dL — ABNORMAL LOW (ref 3.5–5.0)
Anion gap: 7 (ref 5–15)
BUN: 31 mg/dL — ABNORMAL HIGH (ref 6–20)
CO2: 30 mmol/L (ref 22–32)
Calcium: 8.1 mg/dL — ABNORMAL LOW (ref 8.9–10.3)
Chloride: 99 mmol/L (ref 98–111)
Creatinine, Ser: 5.9 mg/dL — ABNORMAL HIGH (ref 0.61–1.24)
GFR, Estimated: 11 mL/min — ABNORMAL LOW (ref >60)
Glucose, Bld: 255 mg/dL — ABNORMAL HIGH (ref 70–99)
Phosphorus: 3.1 mg/dL (ref 2.5–4.6)
Potassium: 4 mmol/L (ref 3.5–5.1)
Sodium: 136 mmol/L (ref 135–145)

## 2022-10-06 MED ORDER — ALTEPLASE 2 MG IJ SOLR: 2.0000 mg | Freq: Once | INTRAMUSCULAR | Status: DC | PRN

## 2022-10-06 MED ORDER — HEPARIN SODIUM (PORCINE) 1000 UNIT/ML IJ SOLN
INTRAMUSCULAR | Status: AC
  Filled 2022-10-06: qty 4

## 2022-10-06 MED ORDER — ANTICOAGULANT SODIUM CITRATE 4% (200MG/5ML) IV SOLN
5.0000 mL | Status: DC | PRN
  Filled 2022-10-06: qty 5

## 2022-10-06 MED ORDER — DIPHENHYDRAMINE-ZINC ACETATE 2-0.1 % EX CREA
TOPICAL_CREAM | Freq: Three times a day (TID) | CUTANEOUS | Status: DC
  Administered 2022-10-13: 1 via TOPICAL
  Filled 2022-10-06 (×2): qty 28

## 2022-10-06 MED ORDER — HEPARIN SODIUM (PORCINE) 1000 UNIT/ML DIALYSIS
1000.0000 [IU] | INTRAMUSCULAR | Status: DC | PRN
  Administered 2022-10-06: 1000 [IU]

## 2022-10-06 NOTE — Progress Notes (Signed)
[facility] Progress Note   Subjective: Seen on HD. Slightly restless but tolerating HD well.      Objective Vitals:   10/06/22 0808 10/06/22 0830 10/06/22 0900 10/06/22 0930  BP:  (!) 156/77 (!) 151/79 (!) 161/81  Pulse:  70 73 80  Resp:  '17 20 16  '$ Temp:      TempSrc:      SpO2:  99% 97% 100%  Weight: 83.6 kg     Height:       Physical Exam General: Chronically ill appearing male in NAD Heart: S1,S2 RRR No M/R/G Lungs: CTAB A/P Abdomen: soft, NABS Extremities:No LE edema Dialysis Access: L AVF + T/B RIJ TDC drsg intact    Additional Objective Labs: Basic Metabolic Panel: Recent Labs  Lab 10/01/22 0343 10/02/22 0930 10/06/22 0100  NA 133* 131* 138  K 5.5* 4.6 5.2*  CL 97* 96* 101  CO2 '23 26 23  '$ GLUCOSE 85 175* 104*  BUN 60* 36* 75*  CREATININE 9.53* 6.76* 10.85*  CALCIUM 9.0 8.5* 8.7*   Liver Function Tests: Recent Labs  Lab 10/01/22 0343  AST 17  ALT 18  ALKPHOS 64  BILITOT 0.3  PROT 6.7  ALBUMIN 3.0*   No results for input(s): "LIPASE", "AMYLASE" in the last 168 hours. CBC: Recent Labs  Lab 10/01/22 0343 10/05/22 0501 10/06/22 0100  WBC 9.2 16.1* 8.7  HGB 10.1* 10.6* 10.0*  HCT 30.5* 33.5* 29.6*  MCV 69.6* 71.9* 69.2*  PLT 295 325 287   Blood Culture    Component Value Date/Time   SDES TRACHEAL ASPIRATE 08/26/2022 1212   SPECREQUEST NONE 08/26/2022 1212   CULT  08/26/2022 1212    ABUNDANT STREPTOCOCCUS ANGINOSIS RARE KLEBSIELLA OXYTOCA ABUNDANT HAEMOPHILUS INFLUENZAE BETA LACTAMASE NEGATIVE Performed at [facility], [place] [address] [place], [place] [address]    REPTSTATUS 08/29/2022 FINAL 08/26/2022 1212    Cardiac Enzymes: No results for input(s): "CKTOTAL", "CKMB", "CKMBINDEX", "TROPONINI" in the last 168 hours. CBG: Recent Labs  Lab 10/05/22 0610 10/05/22 1128 10/05/22 1721 10/05/22 2337 10/06/22 0628  GLUCAP 91 179* 150* 142* 85   Iron Studies: No results for input(s): "IRON", "TIBC",  "TRANSFERRIN", "FERRITIN" in the last 72 hours. '@lablastinr3'$ @ Studies/Results: No results found. Medications:  sodium chloride Stopped (08/30/22 0033)   anticoagulant sodium citrate      amLODipine  5 mg Oral BID   carvedilol  25 mg Oral BID WC   Chlorhexidine Gluconate Cloth  6 each Topical Q0600   darbepoetin (ARANESP) injection - DIALYSIS  150 mcg Subcutaneous Q Mon-1800   feeding supplement (NEPRO CARB STEADY)  237 mL Oral TID WC   heparin injection (subcutaneous)  5,000 Units Subcutaneous Q8H   hydrALAZINE  100 mg Oral Q8H   insulin aspart  0-15 Units Subcutaneous Q6H   insulin glargine-yfgn  10 Units Subcutaneous Daily   multivitamin  1 tablet Oral QHS   mouth rinse  15 mL Mouth Rinse 4 times per day   pantoprazole  40 mg Oral BID   QUEtiapine  25 mg Oral QHS   sevelamer carbonate  800 mg Oral TID WC     Dialysis Orders: SW MWF 3h 17mn  80kg   400/800   2/2 bath  Hep 2000  LFA AVF - last HD 11/13, post 80.1kg - mircera 150 q2, last 11/13 - hectorol 435m IV q HD     Assessment/Plan: 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 S/P course of hypertonic  fluids.  Now doing rehab, eating and talking.  AMS - due to #1. Has had significant improvement. Oriented X 2. Follows commands when prompted by fiance. HTN/ vol  - Volume appears stable. He is getting po BP meds x 3, BP controlled and stable. Avoid hypotension in HD. Volume seems fine.  ESRD - pt required CRRT 11/15-11/17. Resumed IH MWF. Next HD 10/08/2022. HD access - BUN was very high so access recirc was suspected. IR did fistulogram 12/4 and AVF was found be small with competing veins. Using RIJ Quinter now placed by IR on 12/6. Consulted VVS-seen by Dr. Donzetta Matters. No plans documented yet.  Anemia of ESRD: Hgb 10.0, continue Aranesp 117mg weekly while here. Secondary HPTH: CCa ok. Renvela 3 ac tid as binder. Continue VDRA. PO4 added to today's labs.  DM2 - per pmd  Nutrition: K+ 5.2 on  regular diet. Changed to renal diet.   [place].     Finlee Concepcion H. Sharaine Delange NP-C 10/06/2022, 10:08 AM  [phone]

## 2022-10-06 NOTE — Progress Notes (Signed)
Spoke to [employer] with [employer]. Maudie Mercury is working on clinic placement [place] should [employer] approve [employer]. Will await final clinic acceptance.   [name] Renal Navigator [phone]

## 2022-10-06 NOTE — Progress Notes (Signed)
   10/06/22 1142  Pain Assessment  Pain Scale 0-10  Pain Score 0  Hemodialysis Catheter Right Subclavian Double lumen Permanent (Tunneled)  Placement Date/Time: 09/15/22 0907   Serial / Lot #: [id number]  Expiration Date: 04/26/27  Time Out: Correct patient;Correct site;Correct procedure  Maximum sterile barrier precautions: Hand hygiene;Cap;Mask;Sterile gown;Sterile gloves;Large sterile ...  Site Condition No complications  Blue Lumen Status Blood return noted;Saline locked;Heparin locked;Dead end cap in place  Red Lumen Status Blood return noted;Saline locked;Heparin locked;Dead end cap in place  Purple Lumen Status N/A  Catheter fill solution Heparin 1000 units/ml  Catheter fill volume (Arterial) 1.6 cc  Catheter fill volume (Venous) 1.6  Dressing Type Transparent  Dressing Status Antimicrobial disc in place;Clean, Dry, Intact  Drainage Description None  Dressing Change Due 10/10/22  Post treatment catheter status Capped and Clamped  Neurological  Level of Consciousness Alert  Orientation Level Oriented X4  Respiratory  Respiratory Pattern Regular;Unlabored  Chest Assessment Chest expansion symmetrical  Bilateral Breath Sounds Clear;Diminished  Cardiac  Cardiac Rhythm NSR  GU Assessment  Genitourinary (WDL) WDL   Received patient in bed to unit.  Alert and oriented.  Informed consent signed and in chart.   Treatment initiated: 0805 Treatment completed: 4037V  Patient tolerated well.  Transported back to the room  Alert, without acute distress.  Hand-off given to patient's nurse.   Access used: Yes Access issues: No  Total UF removed: 2500 Medication(s) given: No Heparin Post HD VS: 98.4, 71, 18, 161/91, R/A 98 % Post HD weight: 79.0 kg   [facility]

## 2022-10-06 NOTE — Plan of Care (Signed)
  Problem: Activity: Goal: Ability to tolerate increased activity will improve Outcome: Progressing   Problem: Respiratory: Goal: Ability to maintain a clear airway and adequate ventilation will improve Outcome: Progressing   Problem: Role Relationship: Goal: Method of communication will improve Outcome: Progressing   Problem: Education: Goal: Ability to describe self-care measures that may prevent or decrease complications (Diabetes Survival Skills Education) will improve Outcome: Progressing Goal: Individualized Educational Video(s) Outcome: Progressing   Problem: Coping: Goal: Ability to adjust to condition or change in health will improve Outcome: Progressing   Problem: Fluid Volume: Goal: Ability to maintain a balanced intake and output will improve Outcome: Progressing   Problem: Health Behavior/Discharge Planning: Goal: Ability to identify and utilize available resources and services will improve Outcome: Progressing Goal: Ability to manage health-related needs will improve Outcome: Progressing   Problem: Metabolic: Goal: Ability to maintain appropriate glucose levels will improve Outcome: Progressing   Problem: Nutritional: Goal: Maintenance of adequate nutrition will improve Outcome: Progressing Goal: Progress toward achieving an optimal weight will improve Outcome: Progressing   Problem: Skin Integrity: Goal: Risk for impaired skin integrity will decrease Outcome: Progressing   Problem: Tissue Perfusion: Goal: Adequacy of tissue perfusion will improve Outcome: Progressing   Problem: Safety: Goal: Non-violent Restraint(s) Outcome: Progressing   Problem: Education: Goal: Knowledge of General Education information will improve Description: Including pain rating scale, medication(s)/side effects and non-pharmacologic comfort measures Outcome: Progressing   Problem: Health Behavior/Discharge Planning: Goal: Ability to manage health-related needs will  improve Outcome: Progressing   Problem: Clinical Measurements: Goal: Ability to maintain clinical measurements within normal limits will improve Outcome: Progressing Goal: Will remain free from infection Outcome: Progressing Goal: Diagnostic test results will improve Outcome: Progressing Goal: Respiratory complications will improve Outcome: Progressing Goal: Cardiovascular complication will be avoided Outcome: Progressing   Problem: Activity: Goal: Risk for activity intolerance will decrease Outcome: Progressing   Problem: Nutrition: Goal: Adequate nutrition will be maintained Outcome: Progressing   Problem: Coping: Goal: Level of anxiety will decrease Outcome: Progressing   Problem: Elimination: Goal: Will not experience complications related to bowel motility Outcome: Progressing Goal: Will not experience complications related to urinary retention Outcome: Progressing   Problem: Pain Managment: Goal: General experience of comfort will improve Outcome: Progressing   Problem: Safety: Goal: Ability to remain free from injury will improve Outcome: Progressing   Problem: Skin Integrity: Goal: Risk for impaired skin integrity will decrease Outcome: Progressing   Problem: Education: Goal: Knowledge of disease or condition will improve Outcome: Progressing Goal: Knowledge of secondary prevention will improve (MUST DOCUMENT ALL) Outcome: Progressing Goal: Knowledge of patient specific risk factors will improve Elta [place] N/A or DELETE if not current risk factor) Outcome: Progressing

## 2022-10-06 NOTE — Progress Notes (Signed)
PROGRESS NOTE  Randy Ross  POE:[id number] DOB: 06/10/1971 DOA: 08/24/2022 PCP: Medicine, [facility]   Brief Narrative: Patient is a 51 year old male with history of ESRD on dialysis, type 2 diabetes, hypertension who presented here with sudden onset of slurred speech, headache.  He was severely hypertensive on presentation.  CT head showed cerebral hemorrhage.  He was intubated in the emergency department for airway protection and admitted to PCCM/neurology service.  Self extubated on 11/30.  Currently hemodynamically stable.  PT/OT recommending SNF on discharge.  Waiting for insurance Auth.  TOC following.  Prolonged hospital course.  Significant events: 11/14: Admitted for acute cerebellar hemorrhage; intubated in the ER, started on hypertonic saline 11/15: Neurosurgery were consulted, recommended against surgical evacuation 11/16: Cefepime started for suspected aspiration 11/17: Required 1u PRBC transfusion due to CRRT filter 11/19: EEG nonspecific 11/21: MRI brain shows new scattered bilateral acute infarcts 11/22: Completed 7 days antibiotics 11/24: IR consulted given recirculation syndrome, high BUN; plan for fistulogram pending 11/27: Temp cath HD cath placed  11/30: Self-extubated 12/1: Persistent delirium 12/2: Transferred out of ICU to [facility] service 12/3: Pulled out cortrak 12/4: Failed SLP, needs cortrak, fistulogram shows poor outflow 12/5: TDC placed, feeding tube replaced 12/6: HD catheter placed by IR 12/9: Family meeting with palliative care  12/11: Family decided to continue aggressive measures, IR consulted for PEG tube 12/12: SLP requesting MBS prior to PEG tube placement; MBS likely plan for 12/12 12/13: MBS done and recommending dysphagia 1 diet with honey thick liquids 12/26: Tolerating po intake. Waiting on rehab placement in [place]   Assessment & Plan:  Principal Problem:   Acute cerebellar hemorrhage ([place]) Active Problems:    Anemia   ESRD (end stage renal disease) ([place])   Diabetes mellitus ([place])   Acute respiratory failure with hypoxia (HCC)   Hypertensive emergency   Pressure injury of skin   Acute metabolic encephalopathy and delirium   Cerebral edema (HCC)   Acute embolic stroke ([place])   Lower GI bleeding   Dysphagia   Palliative care by specialist   Acute intracerebral hemorrhage: Most likely secondary to poorly controlled hypertension.  Presented with sudden onset of mental change, left-sided weakness, slurred speech, left-sided gaze preference.  Severely hypertensive on presentation.  Was intubated and admitted to ICU and was placed on Cleviprex infusion.  CT scan showed acute intraparenchymal hematoma centered in the right cerebellar hemisphere with severe mass effect on the fourth ventricle and cerebral aqueduct.  Currently he is neurologically stable, neurology has signed off.  He should follow-up with neurology as an outpatient. PT/OT recommending acute inpatient rehab, waiting for insurance Auth.  ESRD on dialysis/hyperkalemia: Nephrology following for dialysis.  Potassium of 5.2 this morning, underwent dialysis today.  Dysphagia: Initially required core track, now on oral diet.  Anemia of chronic disease/Lower GI bleed: Suspected to be have lower GI bleed during his hospitalization, hemoglobin stable.  No further workup.  Acute embolic CVA: MRI of the brain on 11/21 showed many new acute infract throughout the bilateral frontal, bilateral white matter consistent with embolism.  TTE showed EF of 50 to 55%, A1c 5.9, LDL of 91.  Not on any antiplatelet therapy due to cerebral hemorrhage.  Neurology does not recommend further evaluation for cardiac source of embolism because he is not a good candidate for anticoagulation  Hypertensive emergency: Currently blood pressure stable.  Continue amlodipine, Coreg, hydralazine  Aspiration pneumonia: Completed antibiotics course.  Currently on room  air.  Acute metabolic encephalopathy: Most  likely secondary to stroke.  Mental status is stable right now.  Remains impulsive at times.  Diabetes type 2: Well-controlled.  In addition A1c of 5.9.  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  Apply Benadryl cream and supportive care     Nutrition Problem: Inadequate oral intake Etiology: inability to eat    DVT prophylaxis:heparin injection 5,000 Units Start: 09/12/22 1445 Place and maintain sequential compression device Start: 08/25/22 1025     Code Status: Full Code  Family Communication: Girlfriend at bedside  Patient status:Inpatient  Patient is from :Home  Anticipated discharge to:[facility]  Estimated DC date: Waiting for insurance Auth   Consultants: Neurology, PCCM  Procedures: Dialysis, intubation  Antimicrobials:  Anti-infectives (From admission, onward)    Start     Dose/Rate Route Frequency Ordered Stop   09/14/22 1145  ceFAZolin (ANCEF) IVPB 2g/100 mL premix        2 g 200 mL/hr over 30 Minutes Intravenous To Radiology 09/14/22 1123 09/15/22 0905   08/29/22 2000  cefTRIAXone (ROCEPHIN) 2 g in sodium chloride 0.9 % 100 mL IVPB        2 g 200 mL/hr over 30 Minutes Intravenous Every 24 hours 08/29/22 1009 09/01/22 2146   08/28/22 2000  ceFEPIme (MAXIPIME) 1 g in sodium chloride 0.9 % 100 mL IVPB  Status:  Discontinued        1 g 200 mL/hr over 30 Minutes Intravenous Every 24 hours 08/27/22 1009 08/27/22 1016   08/27/22 2000  ceFEPIme (MAXIPIME) 1 g in sodium chloride 0.9 % 100 mL IVPB  Status:  Discontinued        1 g 200 mL/hr over 30 Minutes Intravenous Every 24 hours 08/27/22 1016 08/29/22 1009   08/26/22 1000  ceFEPIme (MAXIPIME) 2 g in sodium chloride 0.9 % 100 mL IVPB  Status:  Discontinued        2 g 200 mL/hr over 30 Minutes Intravenous Every 12 hours 08/26/22 0850 08/27/22 1009        Subjective: Patient seen and examined at bedside today.  Hemodynamically stable.  Overall comfortable, lying in bed.  Little irritated because he does not know when he is going to be discharged to the inpatient rehab.  Complains of itching on bilateral lower legs.  Objective: Vitals:   10/06/22 1100 10/06/22 1130 10/06/22 1140 10/06/22 1142  BP: (!) 152/88 (!) 163/79 (!) 150/90 (!) 161/91  Pulse: 76 74 71 71  Resp: '20 17 15 18  '$ Temp:   98.3 F (36.8 C)   TempSrc:   Oral   SpO2: 100% 99% 98% 98%  Weight:      Height:        Intake/Output Summary (Last 24 hours) at 10/06/2022 1318 Last data filed at 10/06/2022 1142 Gross per 24 hour  Intake 400 ml  Output 2500 ml  Net -2100 ml   Filed Weights   09/29/22 1224 10/03/22 1428 10/06/22 0808  Weight: 75.1 kg 82 kg 83.6 kg    Examination:  General exam: Overall comfortable, not in distress,obese HEENT: PERRL Respiratory system:  no wheezes or crackles  Cardiovascular system: S1 & S2 heard, RRR.  Dialysis catheter on the right chest Gastrointestinal system: Abdomen is nondistended, soft and nontender. Central nervous system: Alert and oriented Extremities: No edema, no clubbing ,no cyanosis Skin: No rashes, no ulcers,no icterus     Data Reviewed: I have personally  reviewed following labs and imaging studies  CBC: Recent Labs  Lab 10/01/22 0343 10/05/22 0501 10/06/22 0100  WBC 9.2 16.1* 8.7  HGB 10.1* 10.6* 10.0*  HCT 30.5* 33.5* 29.6*  MCV 69.6* 71.9* 69.2*  PLT 295 325 536   Basic Metabolic Panel: Recent Labs  Lab 10/01/22 0343 10/02/22 0930 10/06/22 0100  NA 133* 131* 138  K 5.5* 4.6 5.2*  CL 97* 96* 101  CO2 '23 26 23  '$ GLUCOSE 85 175* 104*  BUN 60* 36* 75*  CREATININE 9.53* 6.76* 10.85*  CALCIUM 9.0 8.5* 8.7*  MG 1.9  --   --      No results found for this or any previous visit (from the past 240 hour(s)).   Radiology Studies: No results found.  Scheduled Meds:  amLODipine  5 mg Oral BID    carvedilol  25 mg Oral BID WC   Chlorhexidine Gluconate Cloth  6 each Topical Q0600   darbepoetin (ARANESP) injection - DIALYSIS  150 mcg Subcutaneous Q Mon-1800   feeding supplement (NEPRO CARB STEADY)  237 mL Oral TID WC   heparin injection (subcutaneous)  5,000 Units Subcutaneous Q8H   heparin sodium (porcine)       hydrALAZINE  100 mg Oral Q8H   insulin aspart  0-15 Units Subcutaneous Q6H   insulin glargine-yfgn  10 Units Subcutaneous Daily   multivitamin  1 tablet Oral QHS   mouth rinse  15 mL Mouth Rinse 4 times per day   pantoprazole  40 mg Oral BID   QUEtiapine  25 mg Oral QHS   sevelamer carbonate  800 mg Oral TID WC   Continuous Infusions:  sodium chloride Stopped (08/30/22 0033)     LOS: 80 days   Shelly Coss, MD [facility] P12/27/2023, 1:18 PM

## 2022-10-06 NOTE — TOC Progression Note (Signed)
Transition of Care [facility]) - Progression Note    Patient Details  Name: Randy Ross MRN: [id number] Date of Birth: 10-Jun-1971  [facility]) CM/SW Contact  Pollie Friar, RN Phone Number: 10/06/2022, 1:57 PM  Clinical Narrative:    CM spoke to [place] with [facility]. She states they called [place] yesterday and they state we should have a decision today vs tomorrow. [facility] to call them again today. TOC following   Expected Discharge Plan: [name] Barriers to Discharge: Continued Medical Work up  Expected Discharge Plan and Services       Living arrangements for the past 2 months: Single Family Home                                       Social Determinants of Health (SDOH) Interventions SDOH Screenings   Tobacco Use: High Risk (09/15/2022)    Readmission Risk Interventions     No data to display

## 2022-10-07 LAB — GLUCOSE, CAPILLARY
Glucose-Capillary: 100 mg/dL — ABNORMAL HIGH (ref 70–99)
Glucose-Capillary: 116 mg/dL — ABNORMAL HIGH (ref 70–99)
Glucose-Capillary: 124 mg/dL — ABNORMAL HIGH (ref 70–99)
Glucose-Capillary: 153 mg/dL — ABNORMAL HIGH (ref 70–99)

## 2022-10-07 LAB — BASIC METABOLIC PANEL
Anion gap: 13 (ref 5–15)
BUN: 41 mg/dL — ABNORMAL HIGH (ref 6–20)
CO2: 27 mmol/L (ref 22–32)
Calcium: 8.7 mg/dL — ABNORMAL LOW (ref 8.9–10.3)
Chloride: 98 mmol/L (ref 98–111)
Creatinine, Ser: 7.47 mg/dL — ABNORMAL HIGH (ref 0.61–1.24)
GFR, Estimated: 8 mL/min — ABNORMAL LOW (ref >60)
Glucose, Bld: 106 mg/dL — ABNORMAL HIGH (ref 70–99)
Potassium: 4.4 mmol/L (ref 3.5–5.1)
Sodium: 138 mmol/L (ref 135–145)

## 2022-10-07 NOTE — Progress Notes (Signed)
[facility] Progress Note   Subjective: Seen in room. More alert today, denies SOB. Fiance' at bedside. Still awaiting insurance authorization for transfer to SNF    Objective Vitals:   10/07/22 0002 10/07/22 0338 10/07/22 0819 10/07/22 1254  BP: (!) 132/100 (!) 161/83 (!) 160/86 (!) 157/96  Pulse: 74 81 78 76  Resp: '18 17 19 20  '$ Temp: 98.7 F (37.1 C) 97.9 F (36.6 C) 98.7 F (37.1 C) 98.4 F (36.9 C)  TempSrc: Oral Oral Oral Oral  SpO2: 100% 98% 98% 98%  Weight:      Height:       Physical Exam General: Chronically ill appearing male in NAD Heart: S1,S2 RRR No M/R/G Lungs: CTAB A/P Abdomen: soft, NABS Extremities:No LE edema Dialysis Access: L AVF + T/B RIJ TDC drsg intact  Additional Objective Labs: Basic Metabolic Panel: Recent Labs  Lab 10/06/22 0100 10/06/22 1437 10/07/22 0130  NA 138 136 138  K 5.2* 4.0 4.4  CL 101 99 98  CO2 '23 30 27  '$ GLUCOSE 104* 255* 106*  BUN 75* 31* 41*  CREATININE 10.85* 5.90* 7.47*  CALCIUM 8.7* 8.1* 8.7*  PHOS  --  3.1  --    Liver Function Tests: Recent Labs  Lab 10/01/22 0343 10/06/22 1437  AST 17  --   ALT 18  --   ALKPHOS 64  --   BILITOT 0.3  --   PROT 6.7  --   ALBUMIN 3.0* 2.9*   No results for input(s): "LIPASE", "AMYLASE" in the last 168 hours. CBC: Recent Labs  Lab 10/01/22 0343 10/05/22 0501 10/06/22 0100  WBC 9.2 16.1* 8.7  HGB 10.1* 10.6* 10.0*  HCT 30.5* 33.5* 29.6*  MCV 69.6* 71.9* 69.2*  PLT 295 325 287   Blood Culture    Component Value Date/Time   SDES TRACHEAL ASPIRATE 08/26/2022 1212   SPECREQUEST NONE 08/26/2022 1212   CULT  08/26/2022 1212    ABUNDANT STREPTOCOCCUS ANGINOSIS RARE KLEBSIELLA OXYTOCA ABUNDANT HAEMOPHILUS INFLUENZAE BETA LACTAMASE NEGATIVE Performed at [facility], [place] [address], [place], [place] [address]    REPTSTATUS 08/29/2022 FINAL 08/26/2022 1212    Cardiac Enzymes: No results for input(s): "CKTOTAL", "CKMB", "CKMBINDEX",  "TROPONINI" in the last 168 hours. CBG: Recent Labs  Lab 10/06/22 1446 10/06/22 1753 10/07/22 0000 10/07/22 0632 10/07/22 1250  GLUCAP 248* 156* 100* 116* 153*   Iron Studies: No results for input(s): "IRON", "TIBC", "TRANSFERRIN", "FERRITIN" in the last 72 hours. '@lablastinr3'$ @ Studies/Results: No results found. Medications:  sodium chloride Stopped (08/30/22 0033)    amLODipine  5 mg Oral BID   carvedilol  25 mg Oral BID WC   Chlorhexidine Gluconate Cloth  6 each Topical Q0600   darbepoetin (ARANESP) injection - DIALYSIS  150 mcg Subcutaneous Q Mon-1800   diphenhydrAMINE-zinc acetate   Topical TID   feeding supplement (NEPRO CARB STEADY)  237 mL Oral TID WC   heparin injection (subcutaneous)  5,000 Units Subcutaneous Q8H   hydrALAZINE  100 mg Oral Q8H   insulin aspart  0-15 Units Subcutaneous Q6H   insulin glargine-yfgn  10 Units Subcutaneous Daily   multivitamin  1 tablet Oral QHS   mouth rinse  15 mL Mouth Rinse 4 times per day   pantoprazole  40 mg Oral BID   QUEtiapine  25 mg Oral QHS   sevelamer carbonate  800 mg Oral TID WC     Dialysis Orders: SW MWF 3h 34mn  80kg   400/800  2/2 bath  Hep 2000  LFA AVF - last HD 11/13, post 80.1kg - mircera 150 q2, last 11/13 - hectorol 57mg IV q HD     Assessment/Plan: 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 S/P course of hypertonic fluids.  Now doing rehab, eating and talking.  AMS - due to #1. Has had significant improvement. Oriented X 2. Follows commands when prompted by fiance. HTN/ vol  - Volume appears stable. He is getting po BP meds x 3, BP controlled and stable. Avoid hypotension in HD. Volume seems fine.  ESRD - pt required CRRT 11/15-11/17. Resumed IH MWF. Next HD 10/08/2022. HD access - BUN was very high so access recirc was suspected. IR did fistulogram 12/4 and AVF was found be small with competing veins. Using RIJ TTregonow placed by IR on 12/6. Consulted VVS-seen by Dr.  CDonzetta Matters No plans documented yet.  Anemia of ESRD: Hgb 10.0, continue Aranesp 1523m weekly while here. Secondary HPTH: CCa 9.5. Renvela 800 mg 3 tabs ac tid as binder. Continue VDRA. PO4 3.1.  DM2 - per pmd  Nutrition: K+ 5.2 on regular diet. Changed to renal diet.   [place]   [place]re[place] Tallie H. Allie Ousley NP-C 10/07/2022, 2:02 PM  [phone]

## 2022-10-07 NOTE — Plan of Care (Signed)

## 2022-10-07 NOTE — TOC Progression Note (Signed)
Transition of Care [facility]) - Progression Note    Patient Details  Name: Randy Ross MRN: [id number] Date of Birth: 06/28/1971  [facility]) CM/SW Contact  Pollie Friar, RN Phone Number: 10/07/2022, 3:48 PM  Clinical Narrative:    CM has spoken to [facility] IR 3 times today and they still have not received a decision from [place].  Anderson [facility] at [place] is now saying that they wont have bed even if approved until Sunday.  TOC following.   Expected Discharge Plan: [place] Barriers to Discharge: Continued Medical Work up  Expected Discharge Plan and Services       Living arrangements for the past 2 months: Single Family Home                                       Social Determinants of Health (SDOH) Interventions SDOH Screenings   Tobacco Use: High Risk (09/15/2022)    Readmission Risk Interventions     No data to display

## 2022-10-07 NOTE — Progress Notes (Signed)
Mobility Specialist: Progress Note   10/07/22 1711  Mobility  Activity Ambulated with assistance in hallway  Level of Assistance Contact guard assist, steadying assist  Assistive Device Front wheel walker  Distance Ambulated (ft) 300 ft  Activity Response Tolerated well  Mobility Referral Yes  $Mobility charge 1 Mobility   Pt received in the bed and agreeable to mobility. Mod I with bed mobility and contact guard during ambulation. No c/o throughout. Min cues for direction. Pt back to bed after session with call bell in reach and family present in the room. Bed alarm is on.   [place] Randy Ross Mobility Specialist Please contact via SecureChat or Rehab office at [phone]

## 2022-10-07 NOTE — Progress Notes (Signed)
Occupational Therapy Treatment Patient Details Name: Randy Ross MRN: [id number] DOB: 05-Nov-1970 Today's Date: 10/07/2022   History of present illness 51 y.o. male presented 08/24/22 with slurred speech and headache. Head CT Rt cerebellar hemorrhage; intubated 11/14, self-extubated 11/30; CRRT; MRI brain 11/21 many small acute infarcts; PMH significant for end-stage renal disease on intermittent hemodialysis, diabetes, hypertension   OT comments  Pt progressing towards established OT goals. Focus session on LB dressing and safety with functional mobility. Pt with decreased awareness of R side of environment during functional mobility, bumping into multiple obstacles during mobility within room and hall. Pt with decreased safety awareness observed to attempt to more forcefully push RW rather than going around obstacles present. Min A and max cues for RW management/problem solving.  Continue to highly recommend AIR to optimize safety and independence in ADL and IADL.    Recommendations for follow up therapy are one component of a multi-disciplinary discharge planning process, led by the attending physician.  Recommendations may be updated based on patient status, additional functional criteria and insurance authorization.    Follow Up Recommendations  Acute inpatient rehab (3hours/day)     Assistance Recommended at Discharge Frequent or constant Supervision/Assistance  Patient can return home with the following  A little help with walking and/or transfers;A little help with bathing/dressing/bathroom;Assistance with feeding;Direct supervision/assist for medications management;Direct supervision/assist for financial management;Assist for transportation;Help with stairs or ramp for entrance   Equipment Recommendations  Other (comment) (TBD next venue)    Recommendations for Other Services Rehab consult    Precautions / Restrictions Precautions Precautions: Fall Restrictions Weight  Bearing Restrictions: No       Mobility Bed Mobility Overal bed mobility: Needs Assistance Bed Mobility: Supine to Sit, Sit to Supine     Supine to sit: Supervision, HOB elevated Sit to supine: Supervision   General bed mobility comments: close supervision for safety    Transfers Overall transfer level: Needs assistance Equipment used: Rolling walker (2 wheels) Transfers: Sit to/from Stand Sit to Stand: Min guard           General transfer comment: min guard for safety, cues for hand placement     Balance Overall balance assessment: Needs assistance Sitting-balance support: No upper extremity supported, Feet unsupported Sitting balance-Leahy Scale: Fair Sitting balance - Comments: Min guard A for safety   Standing balance support: Single extremity supported, During functional activity Standing balance-Leahy Scale: Fair Standing balance comment: able to stand statically without assist, BUE using RW needed for mobility                           ADL either performed or assessed with clinical judgement   ADL Overall ADL's : Needs assistance/impaired                     Lower Body Dressing: Min guard Lower Body Dressing Details (indicate cue type and reason): to don pants. Pt family had already donned socks Toilet Transfer: Min guard;Minimal assistance;Rolling walker (2 wheels);Cueing for Office manager Details (indicate cue type and reason): simualted via functional mobility with RW, assist needed to manage Rw and to avoid obstacles on R side         Functional mobility during ADLs: Min guard;Minimal assistance;Cueing for sequencing;Cueing for safety;Rolling walker (2 wheels) General ADL Comments: ADL participation impacted by poor inattention and  impaired balance    Extremity/Trunk Assessment  Vision   Vision Assessment?: Vision impaired- to be further tested in functional context Additional Comments: decr attention  to R   Perception Perception Perception: Impaired   Praxis Praxis Praxis: Impaired Praxis Impairment Details: Motor planning    Cognition Arousal/Alertness: Awake/alert Behavior During Therapy: Flat affect Overall Cognitive Status: Difficult to assess Area of Impairment: Attention, Following commands, Safety/judgement, Awareness, Problem solving                   Current Attention Level: Sustained   Following Commands: Follows one step commands with increased time, Follows multi-step commands inconsistently Safety/Judgement: Decreased awareness of safety, Decreased awareness of deficits Awareness: Intellectual Problem Solving: Slow processing, Difficulty sequencing, Requires verbal cues General Comments: difficult to fully assess as pt not answering many questions, responding to therapist iinconsistently during session. Follows one step commands with increased time. pt family present and endorsing pt more like himself close to baseline and joking with them thoughout day. Pt family reporting pt complaint of not wlking far enough with therapy, so longer distance today, but pt requiring two seated rest breaks indicating decreased awareness of current abilities. Pt also with decreased attention to R side and frequently bumping into objects on R side.        Exercises Exercises: Other exercises Other Exercises Other Exercises: performing functional mobility in hall and around nurses station. Pt requiring mod cues for safety to avoid bumping into items on the R. Pt requiring 2 seated rest breaks during session and mod cues for path finding back to room. Poor safety with RW, observed to pull walker back after bumping into item and push iot back forcefully into same area requring min A to manage RW    Shoulder Instructions       General Comments mother and spouse present    Pertinent Vitals/ Pain       Pain Assessment Pain Assessment: No/denies pain Pain Intervention(s):  Monitored during session  Home Living                                          Prior Functioning/Environment              Frequency  Min 2X/week        Progress Toward Goals  OT Goals(current goals can now be found in the care plan section)  Progress towards OT goals: Progressing toward goals  Acute Rehab OT Goals Patient Stated Goal: walk further OT Goal Formulation: With patient Time For Goal Achievement: 10/09/22 Potential to Achieve Goals: Fair ADL Goals Pt Will Perform Grooming: with supervision;standing Pt Will Perform Lower Body Dressing: with min guard assist;sit to/from stand Pt Will Transfer to Toilet: with min guard assist;ambulating  Plan Discharge plan remains appropriate;Frequency remains appropriate    Co-evaluation                 AM-PAC OT "6 Clicks" Daily Activity     Outcome Measure   Help from another person eating meals?: A Little Help from another person taking care of personal grooming?: A Little Help from another person toileting, which includes using toliet, bedpan, or urinal?: A Lot Help from another person bathing (including washing, rinsing, drying)?: A Lot Help from another person to put on and taking off regular upper body clothing?: A Little Help from another person to put on and taking off regular lower body clothing?: A Little 6  Click Score: 16    End of Session Equipment Utilized During Treatment: Gait belt;Rolling walker (2 wheels)  OT Visit Diagnosis: Unsteadiness on feet (R26.81);Muscle weakness (generalized) (M62.81);Other abnormalities of gait and mobility (R26.89);Other symptoms and signs involving cognitive function;Cognitive communication deficit (R41.841) Symptoms and signs involving cognitive functions: Nontraumatic SAH   Activity Tolerance Patient tolerated treatment well   Patient Left in bed;with call bell/phone within reach;with bed alarm set;with family/visitor present   Nurse  Communication Mobility status        Time: 2233-6122 OT Time Calculation (min): 18 min  Charges: OT General Charges $OT Visit: 1 Visit OT Treatments $Self Care/Home Management : 8-22 mins  Elder Cyphers, OTR/L [facility] Acute Rehabilitation Office: [phone]    Magnus Ivan 10/07/2022, 2:56 PM

## 2022-10-07 NOTE — Progress Notes (Signed)
PROGRESS NOTE  Randy Ross  OEH:[id number] DOB: 31-May-1971 DOA: 08/24/2022 PCP: Medicine, [facility]   Brief Narrative: Patient is a 51 year old male with history of ESRD on dialysis, type 2 diabetes, hypertension who presented here with sudden onset of slurred speech, headache.  He was severely hypertensive on presentation.  CT head showed cerebral hemorrhage.  He was intubated in the emergency department for airway protection and admitted to PCCM/neurology service.  Self extubated on 11/30.  Currently hemodynamically stable.  PT/OT recommending SNF on discharge.  Waiting for insurance Auth.  TOC following.  Prolonged hospital course.  Medically stable for discharge whenever possible  Significant events: 11/14: Admitted for acute cerebellar hemorrhage; intubated in the ER, started on hypertonic saline 11/15: Neurosurgery were consulted, recommended against surgical evacuation 11/16: Cefepime started for suspected aspiration 11/17: Required 1u PRBC transfusion due to CRRT filter 11/19: EEG nonspecific 11/21: MRI brain shows new scattered bilateral acute infarcts 11/22: Completed 7 days antibiotics 11/24: IR consulted given recirculation syndrome, high BUN; plan for fistulogram pending 11/27: Temp cath HD cath placed  11/30: Self-extubated 12/1: Persistent delirium 12/2: Transferred out of ICU to [facility] service 12/3: Pulled out cortrak 12/4: Failed SLP, needs cortrak, fistulogram shows poor outflow 12/5: TDC placed, feeding tube replaced 12/6: HD catheter placed by IR 12/9: Family meeting with palliative care  12/11: Family decided to continue aggressive measures, IR consulted for PEG tube 12/12: SLP requesting MBS prior to PEG tube placement; MBS likely plan for 12/12 12/13: MBS done and recommending dysphagia 1 diet with honey thick liquids 12/26: Tolerating po intake. Waiting on rehab placement in [place]   Assessment & Plan:  Principal Problem:    Acute cerebellar hemorrhage ([place]) Active Problems:   Anemia   ESRD (end stage renal disease) ([place])   Diabetes mellitus ([place])   Acute respiratory failure with hypoxia (HCC)   Hypertensive emergency   Pressure injury of skin   Acute metabolic encephalopathy and delirium   Cerebral edema (HCC)   Acute embolic stroke ([place])   Lower GI bleeding   Dysphagia   Palliative care by specialist   Acute intracerebral hemorrhage: Most likely secondary to poorly controlled hypertension.  Presented with sudden onset of mental change, left-sided weakness, slurred speech, left-sided gaze preference.  Severely hypertensive on presentation.  Was intubated and admitted to ICU and was placed on Cleviprex infusion.  CT scan showed acute intraparenchymal hematoma centered in the right cerebellar hemisphere with severe mass effect on the fourth ventricle and cerebral aqueduct.  Currently he is neurologically stable, neurology has signed off.  He should follow-up with neurology as an outpatient. PT/OT recommending acute inpatient rehab, waiting for insurance Auth.  ESRD on dialysis/hyperkalemia: Nephrology following for dialysis.  Potassium level stable today  Dysphagia: Initially required core track, now on oral diet.  Anemia of chronic disease/Lower GI bleed: Suspected to be have lower GI bleed during his hospitalization, hemoglobin stable.  No further workup.  Acute embolic CVA: MRI of the brain on 11/21 showed many new acute infract throughout the bilateral frontal, bilateral white matter consistent with embolism.  TTE showed EF of 50 to 55%, A1c 5.9, LDL of 91.  Not on any antiplatelet therapy due to cerebral hemorrhage.  Neurology does not recommend further evaluation for cardiac source of embolism because he is not a good candidate for anticoagulation  Hypertensive emergency: Currently blood pressure stable.  Continue amlodipine, Coreg, hydralazine  Aspiration pneumonia: Completed antibiotics course.   Currently on room air.  Acute  metabolic encephalopathy: Most likely secondary to stroke.  Mental status is stable right now.  Remains impulsive at times.  Diabetes type 2: Well-controlled.  In addition A1c of 5.9.  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  Ordered Benadryl cream.  Better today    Nutrition Problem: Inadequate oral intake Etiology: inability to eat    DVT prophylaxis:heparin injection 5,000 Units Start: 09/12/22 1445 Place and maintain sequential compression device Start: 08/25/22 1025     Code Status: Full Code  Family Communication: Girlfriend at bedside  Patient status:Inpatient  Patient is from :Home  Anticipated discharge to:[facility]  Estimated DC date: Waiting for insurance Auth   Consultants: Neurology, PCCM  Procedures: Dialysis, intubation  Antimicrobials:  Anti-infectives (From admission, onward)    Start     Dose/Rate Route Frequency Ordered Stop   09/14/22 1145  ceFAZolin (ANCEF) IVPB 2g/100 mL premix        2 g 200 mL/hr over 30 Minutes Intravenous To Radiology 09/14/22 1123 09/15/22 0905   08/29/22 2000  cefTRIAXone (ROCEPHIN) 2 g in sodium chloride 0.9 % 100 mL IVPB        2 g 200 mL/hr over 30 Minutes Intravenous Every 24 hours 08/29/22 1009 09/01/22 2146   08/28/22 2000  ceFEPIme (MAXIPIME) 1 g in sodium chloride 0.9 % 100 mL IVPB  Status:  Discontinued        1 g 200 mL/hr over 30 Minutes Intravenous Every 24 hours 08/27/22 1009 08/27/22 1016   08/27/22 2000  ceFEPIme (MAXIPIME) 1 g in sodium chloride 0.9 % 100 mL IVPB  Status:  Discontinued        1 g 200 mL/hr over 30 Minutes Intravenous Every 24 hours 08/27/22 1016 08/29/22 1009   08/26/22 1000  ceFEPIme (MAXIPIME) 2 g in sodium chloride 0.9 % 100 mL IVPB  Status:  Discontinued        2 g 200 mL/hr over 30 Minutes Intravenous Every 12 hours 08/26/22 0850 08/27/22 1009        Subjective: Patient seen and examined at bedside today.  Hemodynamically stable.  Comfortable without any new complaints.  Objective: Vitals:   10/07/22 0002 10/07/22 0338 10/07/22 0819 10/07/22 1254  BP: (!) 132/100 (!) 161/83 (!) 160/86 (!) 157/96  Pulse: 74 81 78 76  Resp: '18 17 19 20  '$ Temp: 98.7 F (37.1 C) 97.9 F (36.6 C) 98.7 F (37.1 C) 98.4 F (36.9 C)  TempSrc: Oral Oral Oral Oral  SpO2: 100% 98% 98% 98%  Weight:      Height:        Intake/Output Summary (Last 24 hours) at 10/07/2022 1305 Last data filed at 10/06/2022 1700 Gross per 24 hour  Intake 240 ml  Output --  Net 240 ml   Filed Weights   09/29/22 1224 10/03/22 1428 10/06/22 0808  Weight: 75.1 kg 82 kg 83.6 kg    Examination:  General exam: Overall comfortable, not in distress HEENT: PERRL Respiratory system:  no wheezes or crackles , dialysis catheter on right chest Cardiovascular system: S1 & S2 heard, RRR.  Gastrointestinal system: Abdomen is nondistended, soft and nontender. Central nervous system: Alert and oriented Extremities: No edema, no clubbing ,no cyanosis Skin: No rashes, no ulcers,no icterus     Data Reviewed: I have personally reviewed following labs and imaging studies  CBC: Recent Labs  Lab 10/01/22 0343 10/05/22 0501 10/06/22 0100  WBC 9.2 16.1* 8.7  HGB 10.1* 10.6* 10.0*  HCT 30.5* 33.5* 29.6*  MCV 69.6* 71.9* 69.2*  PLT 295 325 017   Basic Metabolic Panel: Recent Labs  Lab 10/01/22 0343 10/02/22 0930 10/06/22 0100 10/06/22 1437 10/07/22 0130  NA 133* 131* 138 136 138  K 5.5* 4.6 5.2* 4.0 4.4  CL 97* 96* 101 99 98  CO2 '23 26 23 30 27  '$ GLUCOSE 85 175* 104* 255* 106*  BUN 60* 36* 75* 31* 41*  CREATININE 9.53* 6.76* 10.85* 5.90* 7.47*  CALCIUM 9.0 8.5* 8.7* 8.1* 8.7*  MG 1.9  --   --   --   --   PHOS  --   --   --  3.1  --      No results found for this or any previous visit (from the past 240 hour(s)).   Radiology Studies: No results  found.  Scheduled Meds:  amLODipine  5 mg Oral BID   carvedilol  25 mg Oral BID WC   Chlorhexidine Gluconate Cloth  6 each Topical Q0600   darbepoetin (ARANESP) injection - DIALYSIS  150 mcg Subcutaneous Q Mon-1800   diphenhydrAMINE-zinc acetate   Topical TID   feeding supplement (NEPRO CARB STEADY)  237 mL Oral TID WC   heparin injection (subcutaneous)  5,000 Units Subcutaneous Q8H   hydrALAZINE  100 mg Oral Q8H   insulin aspart  0-15 Units Subcutaneous Q6H   insulin glargine-yfgn  10 Units Subcutaneous Daily   multivitamin  1 tablet Oral QHS   mouth rinse  15 mL Mouth Rinse 4 times per day   pantoprazole  40 mg Oral BID   QUEtiapine  25 mg Oral QHS   sevelamer carbonate  800 mg Oral TID WC   Continuous Infusions:  sodium chloride Stopped (08/30/22 0033)     LOS: 36 days   Shelly Coss, MD [facility] P12/28/2023, 1:05 PM

## 2022-10-08 LAB — CBC
HCT: 32.8 % — ABNORMAL LOW (ref 39.0–52.0)
Hemoglobin: 10.4 g/dL — ABNORMAL LOW (ref 13.0–17.0)
MCH: 22.7 pg — ABNORMAL LOW (ref 26.0–34.0)
MCHC: 31.7 g/dL (ref 30.0–36.0)
MCV: 71.6 fL — ABNORMAL LOW (ref 80.0–100.0)
Platelets: 296 10*3/uL (ref 150–400)
RBC: 4.58 MIL/uL (ref 4.22–5.81)
RDW: 20 % — ABNORMAL HIGH (ref 11.5–15.5)
WBC: 11.1 10*3/uL — ABNORMAL HIGH (ref 4.0–10.5)
nRBC: 0 % (ref 0.0–0.2)

## 2022-10-08 LAB — RENAL FUNCTION PANEL
Albumin: 3 g/dL — ABNORMAL LOW (ref 3.5–5.0)
Anion gap: 15 (ref 5–15)
BUN: 61 mg/dL — ABNORMAL HIGH (ref 6–20)
CO2: 25 mmol/L (ref 22–32)
Calcium: 8.7 mg/dL — ABNORMAL LOW (ref 8.9–10.3)
Chloride: 97 mmol/L — ABNORMAL LOW (ref 98–111)
Creatinine, Ser: 10.45 mg/dL — ABNORMAL HIGH (ref 0.61–1.24)
GFR, Estimated: 5 mL/min — ABNORMAL LOW (ref >60)
Glucose, Bld: 81 mg/dL (ref 70–99)
Phosphorus: 5.3 mg/dL — ABNORMAL HIGH (ref 2.5–4.6)
Potassium: 4.9 mmol/L (ref 3.5–5.1)
Sodium: 137 mmol/L (ref 135–145)

## 2022-10-08 LAB — TRIGLYCERIDES: Triglycerides: 71 mg/dL (ref <150)

## 2022-10-08 LAB — GLUCOSE, CAPILLARY
Glucose-Capillary: 142 mg/dL — ABNORMAL HIGH (ref 70–99)
Glucose-Capillary: 196 mg/dL — ABNORMAL HIGH (ref 70–99)
Glucose-Capillary: 224 mg/dL — ABNORMAL HIGH (ref 70–99)

## 2022-10-08 MED ORDER — HEPARIN SODIUM (PORCINE) 1000 UNIT/ML IJ SOLN
INTRAMUSCULAR | Status: AC
  Filled 2022-10-08: qty 4

## 2022-10-08 MED ORDER — PENTAFLUOROPROP-TETRAFLUOROETH EX AERO: 1.0000 | INHALATION_SPRAY | CUTANEOUS | Status: DC | PRN

## 2022-10-08 MED ORDER — LIDOCAINE-PRILOCAINE 2.5-2.5 % EX CREA: 1.0000 | TOPICAL_CREAM | CUTANEOUS | Status: DC | PRN

## 2022-10-08 MED ORDER — NEPRO/CARBSTEADY PO LIQD: 237.0000 mL | ORAL | Status: DC | PRN

## 2022-10-08 MED ORDER — LIDOCAINE HCL (PF) 1 % IJ SOLN: 5.0000 mL | INTRAMUSCULAR | Status: DC | PRN

## 2022-10-08 MED ORDER — PENTAFLUOROPROP-TETRAFLUOROETH EX AERO
INHALATION_SPRAY | CUTANEOUS | Status: AC
  Filled 2022-10-08: qty 30

## 2022-10-08 NOTE — Progress Notes (Signed)
Spoke to [place] at [employer] [place] Also spoke to [employer] yesterday. Pt has been accepted at [facility] on MWF and can start on Wednesday if insurance approves [facility] rehab. Pt will need to arrive at 11:05 am for 11:20 am chair time. Info added to AVS and update provided to RN CM and nephrology staff. Navigator will contact clinic on Tuesday to provide an update of whether pt was d/c over the weekend/holiday.   [name] Renal Navigator [phone]

## 2022-10-08 NOTE — Progress Notes (Signed)
[facility] Progress Note   Subjective:   Seen on HD. Sleeping, awakens to voice. No concerns reported. BP high at start of HD.   Objective Vitals:   10/08/22 0329 10/08/22 0743 10/08/22 1107 10/08/22 1120  BP: (!) 175/78 (!) 145/83 (!) 185/82 (!) 175/96  Pulse: 71 80 76 71  Resp:  '20 12 16  '$ Temp: 97.9 F (36.6 C) 98.4 F (36.9 C) 98.2 F (36.8 C)   TempSrc: Oral Oral    SpO2: 95% 99% 98% 97%  Weight:   83.7 kg   Height:       Physical Exam General: Chronically ill appearing male in NAD Heart: RRR, no murmurs, rubs or gallops Lungs: CTA bilaterally without wheezing, rhonchi or rales Abdomen: Soft, non-distended, +BS Extremities: no edema b/l lower extremities Dialysis Access:  LUE AVF +t/b  Additional Objective Labs: Basic Metabolic Panel: Recent Labs  Lab 10/06/22 0100 10/06/22 1437 10/07/22 0130  NA 138 136 138  K 5.2* 4.0 4.4  CL 101 99 98  CO2 '23 30 27  '$ GLUCOSE 104* 255* 106*  BUN 75* 31* 41*  CREATININE 10.85* 5.90* 7.47*  CALCIUM 8.7* 8.1* 8.7*  PHOS  --  3.1  --    Liver Function Tests: Recent Labs  Lab 10/06/22 1437  ALBUMIN 2.9*   No results for input(s): "LIPASE", "AMYLASE" in the last 168 hours. CBC: Recent Labs  Lab 10/05/22 0501 10/06/22 0100 10/08/22 1118  WBC 16.1* 8.7 11.1*  HGB 10.6* 10.0* 10.4*  HCT 33.5* 29.6* 32.8*  MCV 71.9* 69.2* 71.6*  PLT 325 287 296   Blood Culture    Component Value Date/Time   SDES TRACHEAL ASPIRATE 08/26/2022 1212   SPECREQUEST NONE 08/26/2022 1212   CULT  08/26/2022 1212    ABUNDANT STREPTOCOCCUS ANGINOSIS RARE KLEBSIELLA OXYTOCA ABUNDANT HAEMOPHILUS INFLUENZAE BETA LACTAMASE NEGATIVE Performed at [facility], [place] [address], [place], [place] [address]    REPTSTATUS 08/29/2022 FINAL 08/26/2022 1212    Cardiac Enzymes: No results for input(s): "CKTOTAL", "CKMB", "CKMBINDEX", "TROPONINI" in the last 168 hours. CBG: Recent Labs  Lab 10/07/22 0632 10/07/22 1250  10/07/22 1655 10/08/22 0019 10/08/22 0602  GLUCAP 116* 153* 124* 142* 224*   Iron Studies: No results for input(s): "IRON", "TIBC", "TRANSFERRIN", "FERRITIN" in the last 72 hours. '@lablastinr3'$ @ Studies/Results: No results found. Medications:  sodium chloride Stopped (08/30/22 0033)    amLODipine  5 mg Oral BID   carvedilol  25 mg Oral BID WC   Chlorhexidine Gluconate Cloth  6 each Topical Q0600   darbepoetin (ARANESP) injection - DIALYSIS  150 mcg Subcutaneous Q Mon-1800   diphenhydrAMINE-zinc acetate   Topical TID   feeding supplement (NEPRO CARB STEADY)  237 mL Oral TID WC   heparin injection (subcutaneous)  5,000 Units Subcutaneous Q8H   hydrALAZINE  100 mg Oral Q8H   insulin aspart  0-15 Units Subcutaneous Q6H   insulin glargine-yfgn  10 Units Subcutaneous Daily   multivitamin  1 tablet Oral QHS   mouth rinse  15 mL Mouth Rinse 4 times per day   pantoprazole  40 mg Oral BID   QUEtiapine  25 mg Oral QHS   sevelamer carbonate  800 mg Oral TID WC    Dialysis Orders: SW MWF 3h 49mn  80kg   400/800   2/2 bath  Hep 2000  LFA AVF - last HD 11/13, post 80.1kg - mircera 150 q2, last 11/13 - hectorol 410m IV q HD  Assessment/Plan: Cerebellar IC hemorrhage/cerebral  edema - uncontrolled HTN felt to be cause. S/P course of hypertonic fluids.  Now doing rehab, eating and talking.  AMS - due to #1. Has had significant improvement.  HTN/ vol  - Volume appears stable. He is getting po BP meds. Avoid hypotension in HD. Volume seems fine.  ESRD - pt required CRRT 11/15-11/17. Resumed IHD MWF and is tolerating well.  HD access - BUN was very high so access recirc was suspected. IR did fistulogram 12/4 and AVF was found be small with competing veins. Using RIJ [place] now placed by IR on 12/6. Consulted [facility]-seen by Dr. Donzetta Matters. No plans documented yet.  Anemia of ESRD: Hgb 10.4, continue Aranesp 194mg weekly while here. Secondary HPTH: CCa controlled. Renvela 800 mg 3 tabs ac tid as binder.  Continue VDRA. PO4 3.1.  DM2 - per pmd  Nutrition: Continue renal diet  Dis[place] Per renal navigator, [facility] "    SAnice Paganini PA-C 10/08/2022, 11:34 AM  [facility] Pager: [phone]

## 2022-10-08 NOTE — Progress Notes (Signed)
PROGRESS NOTE  Randy Ross  RKY:[id number] DOB: 1971/03/05 DOA: 08/24/2022 PCP: [facility], [facility]   Brief Narrative: Patient is a 51 year old male with history of ESRD on dialysis, type 2 diabetes, hypertension who presented here with sudden onset of slurred speech, headache.  He was severely hypertensive on presentation.  CT head showed cerebral hemorrhage.  He was intubated in the emergency department for airway protection and admitted to PCCM/neurology service.  Self extubated on 11/30.  Currently hemodynamically stable.  PT/OT recommending Acute inpatient rehab on discharge.  Waiting for insurance Auth.  TOC following.  Prolonged hospital course.  Outpatient dialysis facility arranged.  Medically stable for discharge whenever possible  Assessment & Plan:  Principal Problem:   Acute cerebellar hemorrhage (HCC) Active Problems:   Anemia   ESRD (end stage renal disease) ([place])   Diabetes mellitus ([place])   Acute respiratory failure with hypoxia (HCC)   Hypertensive emergency   Pressure injury of skin   Acute metabolic encephalopathy and delirium   Cerebral edema (HCC)   Acute embolic stroke ([place])   Lower GI bleeding   Dysphagia   Palliative care by specialist   Acute intracerebral hemorrhage: Most likely secondary to poorly controlled hypertension.  Presented with sudden onset of mental change, left-sided weakness, slurred speech, left-sided gaze preference.  Severely hypertensive on presentation.  Was intubated and admitted to ICU and was placed on Cleviprex infusion.  CT scan showed acute intraparenchymal hematoma centered in the right cerebellar hemisphere with severe mass effect on the fourth ventricle and cerebral aqueduct.  Currently he is neurologically stable, neurology has signed off.  He should follow-up with neurology as an outpatient. PT/OT recommending acute inpatient rehab, waiting for insurance Auth.  ESRD on dialysis/hyperkalemia:  Nephrology following for dialysis.  Has a dialysis catheter on the right chest  Dysphagia: Initially required core track, now on oral diet.  Anemia of chronic disease/Lower GI bleed: Suspected to be have lower GI bleed during his hospitalization, hemoglobin stable.  No further workup.  Acute embolic CVA: MRI of the brain on 11/21 showed many new acute infract throughout the bilateral frontal, bilateral white matter consistent with embolism.  TTE showed EF of 50 to 55%, A1c 5.9, LDL of 91.  Not on any antiplatelet therapy due to cerebral hemorrhage.  Neurology does not recommend further evaluation for cardiac source of embolism because he is not a good candidate for anticoagulation  Hypertensive emergency: Currently blood pressure stable.  Continue amlodipine, Coreg, hydralazine  Aspiration pneumonia: Completed antibiotics course.  Currently on room air.  Acute metabolic encephalopathy: Most likely secondary to stroke.  Mental status is stable right now.  Remains impulsive at times.  Diabetes type 2: Well-controlled.  In addition A1c of 5.9.  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  Ordered Benadryl cream.  Resolved    Nutrition Problem: Inadequate oral intake Etiology: inability to eat    DVT prophylaxis:heparin injection 5,000 Units Start: 09/12/22 1445 Place and maintain sequential compression device Start: 08/25/22 1025     Code Status: Full Code  Family Communication: Girlfriend at bedside  Patient status:Inpatient  Patient is from :Home  Anticipated discharge to:[facility]  Estimated DC date: Waiting for insurance Auth   Consultants: Neurology, PCCM  Procedures: Dialysis, intubation  Antimicrobials:  Anti-infectives (From admission, onward)    Start     Dose/Rate Route Frequency Ordered Stop   09/14/22 1145  ceFAZolin (ANCEF) IVPB 2g/100 mL premix         2 g 200 mL/hr over 30 Minutes Intravenous To Radiology 09/14/22 1123 09/15/22 0905   08/29/22 2000  cefTRIAXone (ROCEPHIN) 2 g in sodium chloride 0.9 % 100 mL IVPB        2 g 200 mL/hr over 30 Minutes Intravenous Every 24 hours 08/29/22 1009 09/01/22 2146   08/28/22 2000  ceFEPIme (MAXIPIME) 1 g in sodium chloride 0.9 % 100 mL IVPB  Status:  Discontinued        1 g 200 mL/hr over 30 Minutes Intravenous Every 24 hours 08/27/22 1009 08/27/22 1016   08/27/22 2000  ceFEPIme (MAXIPIME) 1 g in sodium chloride 0.9 % 100 mL IVPB  Status:  Discontinued        1 g 200 mL/hr over 30 Minutes Intravenous Every 24 hours 08/27/22 1016 08/29/22 1009   08/26/22 1000  ceFEPIme (MAXIPIME) 2 g in sodium chloride 0.9 % 100 mL IVPB  Status:  Discontinued        2 g 200 mL/hr over 30 Minutes Intravenous Every 12 hours 08/26/22 0850 08/27/22 1009       Subjective: Seen and examined at bedside today.  No new complaints, comfortably lying in the bed Objective: Vitals:   10/08/22 1130 10/08/22 1200 10/08/22 1230 10/08/22 1300  BP: (!) 179/109 (!) 174/76 (!) 195/81 (!) 177/67  Pulse: 73 70 66 69  Resp: '15 15 16 19  '$ Temp:      TempSrc:      SpO2:   98% 98%  Weight:      Height:        Intake/Output Summary (Last 24 hours) at 10/08/2022 1313 Last data filed at 10/08/2022 0809 Gross per 24 hour  Intake 240 ml  Output --  Net 240 ml   Filed Weights   10/03/22 1428 10/06/22 0808 10/08/22 1107  Weight: 82 kg 83.6 kg 83.7 kg    Examination:  General exam: Overall comfortable, not in distress HEENT: PERRL Respiratory system:  no wheezes or crackles .Dialysis catheter on right chest Cardiovascular system: S1 & S2 heard, RRR.  Gastrointestinal system: Abdomen is nondistended, soft and nontender. Central nervous system: Alert and oriented Extremities: No edema, no clubbing ,no cyanosis Skin: No rashes, no ulcers,no icterus     Data Reviewed: I have personally reviewed following labs and imaging  studies  CBC: Recent Labs  Lab 10/05/22 0501 10/06/22 0100 10/08/22 1118  WBC 16.1* 8.7 11.1*  HGB 10.6* 10.0* 10.4*  HCT 33.5* 29.6* 32.8*  MCV 71.9* 69.2* 71.6*  PLT 325 287 767   Basic Metabolic Panel: Recent Labs  Lab 10/02/22 0930 10/06/22 0100 10/06/22 1437 10/07/22 0130 10/08/22 1118  NA 131* 138 136 138 137  K 4.6 5.2* 4.0 4.4 4.9  CL 96* 101 99 98 97*  CO2 '26 23 30 27 25  '$ GLUCOSE 175* 104* 255* 106* 81  BUN 36* 75* 31* 41* 61*  CREATININE 6.76* 10.85* 5.90* 7.47* 10.45*  CALCIUM 8.5* 8.7* 8.1* 8.7* 8.7*  PHOS  --   --  3.1  --  5.3*     No results found for this or any previous visit (from the past 240 hour(s)).   Radiology Studies: No results found.  Scheduled Meds:  amLODipine  5 mg Oral BID   carvedilol  25 mg Oral BID WC   Chlorhexidine Gluconate Cloth  6 each Topical Q0600   darbepoetin (ARANESP) injection - DIALYSIS  150 mcg  Subcutaneous Q Mon-1800   diphenhydrAMINE-zinc acetate   Topical TID   feeding supplement (NEPRO CARB STEADY)  237 mL Oral TID WC   heparin injection (subcutaneous)  5,000 Units Subcutaneous Q8H   hydrALAZINE  100 mg Oral Q8H   insulin aspart  0-15 Units Subcutaneous Q6H   insulin glargine-yfgn  10 Units Subcutaneous Daily   multivitamin  1 tablet Oral QHS   mouth rinse  15 mL Mouth Rinse 4 times per day   pantoprazole  40 mg Oral BID   QUEtiapine  25 mg Oral QHS   sevelamer carbonate  800 mg Oral TID WC   Continuous Infusions:  sodium chloride Stopped (08/30/22 0033)     LOS: 45 days   Shelly Coss, MD [facility] P12/29/2023, 1:13 PM

## 2022-10-08 NOTE — Progress Notes (Signed)
Received patient in bed to unit.  Alert and oriented.  Informed consent signed and in chart.   Treatment initiated: 1120 Treatment completed: 1423  Patient tolerated well.  Transported back to the room  Alert, without acute distress.  Hand-off given to patient's nurse.   Access used: catheter Access issues: n/a  Total UF removed: 3000 Medication(s) given: heparin lock Post HD weight: 79.6kg   10/08/22 1431  Vitals  Temp 98.4 F (36.9 C)  Pulse Rate 70  Resp (!) 21  Oxygen Therapy  SpO2 98 %  O2 Device Room Air  Patient Activity (if Appropriate) In bed  Post Treatment  Dialyzer Clearance Lightly streaked  Duration of HD Treatment -hour(s) 3 hour(s)  Hemodialysis Intake (mL) 0 mL  Liters Processed 72  Fluid Removed (mL) 3000 mL  Tolerated HD Treatment Yes           [facility]

## 2022-10-08 NOTE — Progress Notes (Signed)
PT Cancellation Note  Patient Details Name: Randy Ross MRN: [id number] DOB: [birth year]   Cancelled Treatment:    Reason Eval/Treat Not Completed: (P) Patient at procedure or test/unavailable, off unit at HD. Will check back as schedule allows to continue with PT POC.  Audry Riles. PTA Acute Rehabilitation Services Office: [place] 10/08/2022, 2:03 PM

## 2022-10-08 NOTE — Progress Notes (Addendum)
Speech Language Pathology Treatment: Cognitive-Linquistic  Patient Details Name: Randy Ross MRN: [id number] DOB: [birth year] Today's Date: 10/08/2022 Time: 4585-9292 SLP Time Calculation (min) (ACUTE ONLY): 13 min  Assessment / Plan / Recommendation Clinical Impression  Pt was seen for cognitive-linguistic treatment with his fiancee present. He was asleep upon SLP's entry, but he roused with verbal and tactile stimulation. Pt was restless and required frequent prompts for sustained attention. Pt demonstrated 20% accuracy with reasoning increasing to 50% with verbal prompts and phonemic cues. A simple medication management task was attempted, but was discontinued due to pt's notable difficulty with it despite SLP's support. Pt provided 3/4 necessary steps during a verbal sequencing task. The session was abbreviated to allow his transport to HD, but SLP will continue to follow pt.    HPI HPI: Pt is a 51 y.o. male who presented with sudden onset slurred speech, chest pain, left sided weakness. Surrounding edema in the face of the fourth ventricle without hydrocephalus. 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 ETT 11/14-11/30 (self-extubated). Palliative meeting 12/1: full scope. PMH: end-stage renal disease on intermittent hemodialysis, diabetes, hypertension.      SLP Plan  Continue with current plan of care      Recommendations for follow up therapy are one component of a multi-disciplinary discharge planning process, led by the attending physician.  Recommendations may be updated based on patient status, additional functional criteria and insurance authorization.    Recommendations  Diet recommendations: Regular;Thin liquid Liquids provided via: Cup;Straw Medication Administration: Whole meds with liquid Supervision: Patient able to self feed Compensations: Slow rate;Small sips/bites;Minimize environmental distractions Postural Changes  and/or Swallow Maneuvers: Seated upright 90 degrees                Oral Care Recommendations: Oral care BID Follow Up Recommendations: Skilled nursing-short term rehab (<3 hours/day) Assistance recommended at discharge: Frequent or constant Supervision/Assistance Plan: Continue with current plan of care          Randy Ross I. Randy Ross, [place], [place] Office number [phone]  Randy Ross  10/08/2022, 10:51 AM

## 2022-10-08 NOTE — Progress Notes (Signed)
   Palliative Medicine Inpatient Follow Up Note   Palliative care will sign off on Randy Ross at this time given that goals are clear for acute rehabilitation and continuation of hemodialysis.  Please reach out at anytime if additional support is needed.   No Charge ______________________________________________________________________________________ [place] Team Team Cell Phone: [phone] Please utilize secure chat with additional questions, if there is no response within 30 minutes please call the above phone number  Palliative Medicine Team providers are available by phone from 7am to 7pm daily and can be reached through the team cell phone.  Should this patient require assistance outside of these hours, please call the patient's attending physician.

## 2022-10-08 NOTE — TOC Progression Note (Signed)
Transition of Care [facility]) - Progression Note    Patient Details  Name: Randy Ross MRN: [id number] Date of Birth: Oct 22, 1970  [facility] of [facility] CM/SW Contact  Pollie Friar, RN Phone Number: 10/08/2022, 4:45 PM  Clinical Narrative:    [facility] IR still without response on authorization. CM attempted to get information with [facility] without success.  TOC following.   Expected Discharge Plan: [place] Barriers to Discharge: Continued Medical Work up  Expected Discharge Plan and Services       Living arrangements for the past 2 months: Single Family Home                                       Social Determinants of Health (SDOH) Interventions SDOH Screenings   Tobacco Use: High Risk (09/15/2022)    Readmission Risk Interventions     No data to display

## 2022-10-09 LAB — GLUCOSE, CAPILLARY
Glucose-Capillary: 108 mg/dL — ABNORMAL HIGH (ref 70–99)
Glucose-Capillary: 240 mg/dL — ABNORMAL HIGH (ref 70–99)
Glucose-Capillary: 84 mg/dL (ref 70–99)
Glucose-Capillary: 86 mg/dL (ref 70–99)

## 2022-10-09 MED ORDER — CHLORHEXIDINE GLUCONATE CLOTH 2 % EX PADS
6.0000 | MEDICATED_PAD | Freq: Every day | CUTANEOUS | Status: DC
  Administered 2022-10-10: 6 via TOPICAL

## 2022-10-09 MED ORDER — DARBEPOETIN ALFA 150 MCG/0.3ML IJ SOSY: 150.0000 ug | PREFILLED_SYRINGE | INTRAMUSCULAR | Status: DC

## 2022-10-09 MED ORDER — DARBEPOETIN ALFA 150 MCG/0.3ML IJ SOSY
150.0000 ug | PREFILLED_SYRINGE | Freq: Once | INTRAMUSCULAR | Status: DC
  Filled 2022-10-09: qty 0.3

## 2022-10-09 NOTE — Progress Notes (Signed)
[facility]  Patient will not be seen by nephrology team today. Please page with any concerns. Next HD will be Sunday, 10/10/22 due to holiday schedule. HD orders have been placed.  Anice Paganini, PA-C 10/09/2022, 12:31 PM  [facility] Pager: [phone]

## 2022-10-09 NOTE — Progress Notes (Signed)
PROGRESS NOTE  Natalie Leclaire  IOX:[id number] DOB: May 21, 1971 DOA: 08/24/2022 PCP: [facility]   Brief Narrative: Patient is a 51 year old male with history of ESRD on dialysis, type 2 diabetes, hypertension who presented here with sudden onset of slurred speech, headache.  He was severely hypertensive on presentation.  CT head showed cerebral hemorrhage.  He was intubated in the emergency department for airway protection and admitted to PCCM/neurology service.  Self extubated on 11/30.  Currently hemodynamically stable.  PT/OT recommending Acute inpatient rehab on discharge.  Waiting for insurance Auth.  TOC following.  Prolonged hospital course.  Outpatient dialysis facility arranged.  Medically stable for discharge whenever possible  Assessment & Plan:  Principal Problem:   Acute cerebellar hemorrhage (HCC) Active Problems:   Anemia   ESRD (end stage renal disease) ([place])   Diabetes mellitus ([place])   Acute respiratory failure with hypoxia (HCC)   Hypertensive emergency   Pressure injury of skin   Acute metabolic encephalopathy and delirium   Cerebral edema (HCC)   Acute embolic stroke ([place])   Lower GI bleeding   Dysphagia   Palliative care by specialist   Acute intracerebral hemorrhage: Most likely secondary to poorly controlled hypertension.  Presented with sudden onset of mental change, left-sided weakness, slurred speech, left-sided gaze preference.  Severely hypertensive on presentation.  Was intubated and admitted to ICU and was placed on Cleviprex infusion.  CT scan showed acute intraparenchymal hematoma centered in the right cerebellar hemisphere with severe mass effect on the fourth ventricle and cerebral aqueduct.  Currently he is neurologically stable, neurology has signed off.  He should follow-up with neurology as an outpatient. PT/OT recommending acute inpatient rehab, waiting for insurance Auth.  ESRD on dialysis/hyperkalemia:  Nephrology following for dialysis.  Has a dialysis catheter on the right chest  Dysphagia: Initially required core track, now on oral diet.  Anemia of chronic disease/Lower GI bleed: Suspected to be have lower GI bleed during his hospitalization, hemoglobin stable.  No further workup.  Acute embolic CVA: MRI of the brain on 11/21 showed many new acute infract throughout the bilateral frontal, bilateral white matter consistent with embolism.  TTE showed EF of 50 to 55%, A1c 5.9, LDL of 91.  Not on any antiplatelet therapy due to cerebral hemorrhage.  Neurology does not recommend further evaluation for cardiac source of embolism because he is not a good candidate for anticoagulation  Hypertensive emergency: Currently blood pressure stable.  Continue amlodipine, Coreg, hydralazine  Aspiration pneumonia: Completed antibiotics course.  Currently on room air.  Acute metabolic encephalopathy: Most likely secondary to stroke.  Mental status is stable right now.  Remains impulsive at times.  Diabetes type 2: Well-controlled.  In addition A1c of 5.9.  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  Ordered Benadryl cream.  Resolved    Nutrition Problem: Inadequate oral intake Etiology: inability to eat    DVT prophylaxis:heparin injection 5,000 Units Start: 09/12/22 1445 Place and maintain sequential compression device Start: 08/25/22 1025     Code Status: Full Code  Family Communication: Girlfriend at bedside  Patient status:Inpatient  Patient is from :Home  Anticipated discharge to:[facility]  Estimated DC date: Waiting for insurance Auth   Consultants: Neurology, PCCM  Procedures: Dialysis, intubation  Antimicrobials:  Anti-infectives (From admission, onward)    Start     Dose/Rate Route Frequency Ordered Stop   09/14/22 1145  ceFAZolin (ANCEF) IVPB 2g/100 mL premix         2 g 200 mL/hr over 30 Minutes Intravenous To Radiology 09/14/22 1123 09/15/22 0905   08/29/22 2000  cefTRIAXone (ROCEPHIN) 2 g in sodium chloride 0.9 % 100 mL IVPB        2 g 200 mL/hr over 30 Minutes Intravenous Every 24 hours 08/29/22 1009 09/01/22 2146   08/28/22 2000  ceFEPIme (MAXIPIME) 1 g in sodium chloride 0.9 % 100 mL IVPB  Status:  Discontinued        1 g 200 mL/hr over 30 Minutes Intravenous Every 24 hours 08/27/22 1009 08/27/22 1016   08/27/22 2000  ceFEPIme (MAXIPIME) 1 g in sodium chloride 0.9 % 100 mL IVPB  Status:  Discontinued        1 g 200 mL/hr over 30 Minutes Intravenous Every 24 hours 08/27/22 1016 08/29/22 1009   08/26/22 1000  ceFEPIme (MAXIPIME) 2 g in sodium chloride 0.9 % 100 mL IVPB  Status:  Discontinued        2 g 200 mL/hr over 30 Minutes Intravenous Every 12 hours 08/26/22 0850 08/27/22 1009       Subjective: Patient seen and examined at bedside today.  Hemodynamically stable.  Lying in bed.  Very frustrated due to his situation not being able to be moved to inpatient rehab soon.  Girlfriend at bedside  Objective: Vitals:   10/08/22 2209 10/09/22 0415 10/09/22 0750 10/09/22 1123  BP: (!) 160/75 (!) 147/74 (!) 166/78 (!) 158/100  Pulse: 77 73 78 73  Resp:   17 18  Temp: 99 F (37.2 C) 98.6 F (37 C) 98.2 F (36.8 C) 97.9 F (36.6 C)  TempSrc: Oral Oral Oral Oral  SpO2: 100% 97% 96% 100%  Weight:      Height:        Intake/Output Summary (Last 24 hours) at 10/09/2022 1134 Last data filed at 10/08/2022 2359 Gross per 24 hour  Intake 440 ml  Output 3001 ml  Net -2561 ml   Filed Weights   10/06/22 0808 10/08/22 1107 10/08/22 1431  Weight: 83.6 kg 83.7 kg 79.6 kg    Examination:  General exam: Overall comfortable, not in distress HEENT: PERRL Respiratory system:  no wheezes or crackles , dialysis catheter on the right chest Cardiovascular system: S1 & S2 heard, RRR.  Gastrointestinal system: Abdomen is nondistended, soft and  nontender. Central nervous system: Alert and oriented Extremities: No edema, no clubbing ,no cyanosis Skin: No rashes, no ulcers,no icterus     Data Reviewed: I have personally reviewed following labs and imaging studies  CBC: Recent Labs  Lab 10/05/22 0501 10/06/22 0100 10/08/22 1118  WBC 16.1* 8.7 11.1*  HGB 10.6* 10.0* 10.4*  HCT 33.5* 29.6* 32.8*  MCV 71.9* 69.2* 71.6*  PLT 325 287 130   Basic Metabolic Panel: Recent Labs  Lab 10/06/22 0100 10/06/22 1437 10/07/22 0130 10/08/22 1118  NA 138 136 138 137  K 5.2* 4.0 4.4 4.9  CL 101 99 98 97*  CO2 '23 30 27 25  '$ GLUCOSE 104* 255* 106* 81  BUN 75* 31* 41* 61*  CREATININE 10.85* 5.90* 7.47* 10.45*  CALCIUM 8.7* 8.1* 8.7* 8.7*  PHOS  --  3.1  --  5.3*     No results found for this or any previous visit (from the past 240 hour(s)).   Radiology Studies: No results found.  Scheduled Meds:  amLODipine  5 mg Oral BID   carvedilol  25 mg  Oral BID WC   Chlorhexidine Gluconate Cloth  6 each Topical Q0600   darbepoetin (ARANESP) injection - DIALYSIS  150 mcg Subcutaneous Q Mon-1800   diphenhydrAMINE-zinc acetate   Topical TID   feeding supplement (NEPRO CARB STEADY)  237 mL Oral TID WC   heparin injection (subcutaneous)  5,000 Units Subcutaneous Q8H   hydrALAZINE  100 mg Oral Q8H   insulin aspart  0-15 Units Subcutaneous Q6H   insulin glargine-yfgn  10 Units Subcutaneous Daily   multivitamin  1 tablet Oral QHS   mouth rinse  15 mL Mouth Rinse 4 times per day   pantoprazole  40 mg Oral BID   QUEtiapine  25 mg Oral QHS   sevelamer carbonate  800 mg Oral TID WC   Continuous Infusions:  sodium chloride Stopped (08/30/22 0033)     LOS: 23 days   Shelly Coss, MD [facility] P12/30/2023, 11:34 AM

## 2022-10-09 NOTE — Progress Notes (Signed)
Physical Therapy Treatment Patient Details Name: Randy Ross MRN: [id number] DOB: [birth year] Today's Date: 10/09/2022   History of Present Illness 51 y.o. male presented 08/24/22 with slurred speech and headache. Head CT Rt cerebellar hemorrhage; intubated 11/14, self-extubated 11/30; CRRT; MRI brain 11/21 many small acute infarcts; PMH significant for end-stage renal disease on intermittent hemodialysis, diabetes, hypertension    PT Comments    Pt making steady progress with mobility. Continues to require assist with mobility and pt with cognitive deficits. Continue to recommend AIR at dc.    Recommendations for follow up therapy are one component of a multi-disciplinary discharge planning process, led by the attending physician.  Recommendations may be updated based on patient status, additional functional criteria and insurance authorization.  Follow Up Recommendations  Acute inpatient rehab (3hours/day) Can patient physically be transported by private vehicle: Yes   Assistance Recommended at Discharge Frequent or constant Supervision/Assistance  Patient can return home with the following Assistance with cooking/housework;Assistance with feeding;Direct supervision/assist for medications management;Direct supervision/assist for financial management;Assist for transportation;Help with stairs or ramp for entrance;A little help with walking and/or transfers;A little help with bathing/dressing/bathroom   Equipment Recommendations  Other (comment) (To be determined)    Recommendations for Other Services       Precautions / Restrictions Precautions Precautions: Fall Restrictions Weight Bearing Restrictions: No     Mobility  Bed Mobility Overal bed mobility: Needs Assistance Bed Mobility: Supine to Sit, Sit to Supine     Supine to sit: Supervision, HOB elevated Sit to supine: Supervision   General bed mobility comments: supervision for safety    Transfers Overall  transfer level: Needs assistance Equipment used: Rolling walker (2 wheels), None Transfers: Sit to/from Stand Sit to Stand: Min guard           General transfer comment: min guard for safety, cues for hand placement    Ambulation/Gait Ambulation/Gait assistance: Min assist, Min guard Gait Distance (Feet): 350 Feet (+150') Assistive device: Rolling walker (2 wheels), None, Rollator (4 wheels) Gait Pattern/deviations: Step-through pattern, Decreased stride length, Trunk flexed Gait velocity: decr Gait velocity interpretation: 1.31 - 2.62 ft/sec, indicative of limited community ambulator   General Gait Details: Min guard assist with walker and rollator for safety and verbal cues to stand more erect. Pt bearing down more heavily on UE's than necessary. Amb without assist device with min assist for balance.   Stairs             Wheelchair Mobility    Modified Rankin (Stroke Patients Only) Modified Rankin (Stroke Patients Only) Pre-Morbid Rankin Score: No symptoms Modified Rankin: Moderately severe disability     Balance Overall balance assessment: Needs assistance Sitting-balance support: No upper extremity supported, Feet unsupported Sitting balance-Leahy Scale: Fair     Standing balance support: During functional activity, No upper extremity supported Standing balance-Leahy Scale: Fair                              Cognition Arousal/Alertness: Awake/alert Behavior During Therapy: Impulsive Overall Cognitive Status: Difficult to assess Area of Impairment: Attention, Following commands, Safety/judgement, Awareness, Problem solving                   Current Attention Level: Sustained   Following Commands: Follows one step commands with increased time Safety/Judgement: Decreased awareness of safety, Decreased awareness of deficits Awareness: Intellectual Problem Solving: Slow processing, Difficulty sequencing, Requires verbal cues  Exercises      General Comments        Pertinent Vitals/Pain Pain Assessment Pain Assessment: No/denies pain    Home Living                          Prior Function            PT Goals (current goals can now be found in the care plan section) Progress towards PT goals: Progressing toward goals    Frequency    Min 3X/week      PT Plan Current plan remains appropriate    Co-evaluation              AM-PAC PT "6 Clicks" Mobility   Outcome Measure  Help needed turning from your back to your side while in a flat bed without using bedrails?: A Little Help needed moving from lying on your back to sitting on the side of a flat bed without using bedrails?: A Little Help needed moving to and from a bed to a chair (including a wheelchair)?: A Little Help needed standing up from a chair using your arms (e.g., wheelchair or bedside chair)?: A Little Help needed to walk in hospital room?: A Little Help needed climbing 3-5 steps with a railing? : Total 6 Click Score: 16    End of Session Equipment Utilized During Treatment: Gait belt Activity Tolerance: Patient tolerated treatment well Patient left: in bed;with bed alarm set;with call bell/phone within reach;with family/visitor present Nurse Communication: Mobility status PT Visit Diagnosis: Other abnormalities of gait and mobility (R26.89);Other symptoms and signs involving the nervous system (R29.898)     Time: 3716-9678 PT Time Calculation (min) (ACUTE ONLY): 14 min  Charges:  $Gait Training: 8-22 mins                     [facility] [place] [facility] [place] 10/09/2022, 4:29 PM

## 2022-10-10 LAB — CBC
HCT: 33.6 % — ABNORMAL LOW (ref 39.0–52.0)
Hemoglobin: 10.7 g/dL — ABNORMAL LOW (ref 13.0–17.0)
MCH: 22.9 pg — ABNORMAL LOW (ref 26.0–34.0)
MCHC: 31.8 g/dL (ref 30.0–36.0)
MCV: 71.8 fL — ABNORMAL LOW (ref 80.0–100.0)
Platelets: 316 10*3/uL (ref 150–400)
RBC: 4.68 MIL/uL (ref 4.22–5.81)
RDW: 19.9 % — ABNORMAL HIGH (ref 11.5–15.5)
WBC: 12 10*3/uL — ABNORMAL HIGH (ref 4.0–10.5)
nRBC: 0 % (ref 0.0–0.2)

## 2022-10-10 LAB — RENAL FUNCTION PANEL
Albumin: 3.1 g/dL — ABNORMAL LOW (ref 3.5–5.0)
Anion gap: 11 (ref 5–15)
BUN: 55 mg/dL — ABNORMAL HIGH (ref 6–20)
CO2: 27 mmol/L (ref 22–32)
Calcium: 8.6 mg/dL — ABNORMAL LOW (ref 8.9–10.3)
Chloride: 97 mmol/L — ABNORMAL LOW (ref 98–111)
Creatinine, Ser: 10.01 mg/dL — ABNORMAL HIGH (ref 0.61–1.24)
GFR, Estimated: 6 mL/min — ABNORMAL LOW (ref >60)
Glucose, Bld: 84 mg/dL (ref 70–99)
Phosphorus: 5.9 mg/dL — ABNORMAL HIGH (ref 2.5–4.6)
Potassium: 5 mmol/L (ref 3.5–5.1)
Sodium: 135 mmol/L (ref 135–145)

## 2022-10-10 LAB — GLUCOSE, CAPILLARY
Glucose-Capillary: 131 mg/dL — ABNORMAL HIGH (ref 70–99)
Glucose-Capillary: 149 mg/dL — ABNORMAL HIGH (ref 70–99)
Glucose-Capillary: 153 mg/dL — ABNORMAL HIGH (ref 70–99)
Glucose-Capillary: 191 mg/dL — ABNORMAL HIGH (ref 70–99)
Glucose-Capillary: 215 mg/dL — ABNORMAL HIGH (ref 70–99)
Glucose-Capillary: 95 mg/dL (ref 70–99)

## 2022-10-10 NOTE — Progress Notes (Signed)
Received patient in bed to unit.  Alert and oriented.  Informed consent signed and in chart.   Treatment initiated: 1660 Treatment completed: 1107  Patient tolerated well.  Transported back to the room  Alert, without acute distress.  Hand-off given to patient's nurse.   Access used: L HDCatheter Access issues: None  Medication(s) given: None Post HD weight: 80kg   [facility]    10/10/22 1113  Vitals  Temp 98.3 F (36.8 C)  BP (!) 159/80  Pulse Rate 76  Resp 17  Oxygen Therapy  SpO2 97 %  O2 Device Room Air  Patient Activity (if Appropriate) In bed  Post Treatment  Dialyzer Clearance Lightly streaked  Duration of HD Treatment -hour(s) 3 hour(s)  Hemodialysis Intake (mL) 0 mL  Liters Processed 65.3  Fluid Removed (mL) 2000 mL  Tolerated HD Treatment Yes  Hemodialysis Catheter Right Subclavian Double lumen Permanent (Tunneled)  Placement Date/Time: 09/15/22 0907   Serial / Lot #: [id number]  Expiration Date: 04/26/27  Time Out: Correct patient;Correct site;Correct procedure  Maximum sterile barrier precautions: Hand hygiene;Cap;Mask;Sterile gown;Sterile gloves;Large sterile ...  Post treatment catheter status Capped and Clamped

## 2022-10-10 NOTE — Plan of Care (Signed)
  Problem: Activity: Goal: Ability to tolerate increased activity will improve Outcome: Progressing   Problem: Respiratory: Goal: Ability to maintain a clear airway and adequate ventilation will improve Outcome: Progressing   Problem: Role Relationship: Goal: Method of communication will improve Outcome: Progressing   Problem: Education: Goal: Ability to describe self-care measures that may prevent or decrease complications (Diabetes Survival Skills Education) will improve Outcome: Progressing Goal: Individualized Educational Video(s) Outcome: Progressing   Problem: Coping: Goal: Ability to adjust to condition or change in health will improve Outcome: Progressing   Problem: Fluid Volume: Goal: Ability to maintain a balanced intake and output will improve Outcome: Progressing   Problem: Health Behavior/Discharge Planning: Goal: Ability to identify and utilize available resources and services will improve Outcome: Progressing Goal: Ability to manage health-related needs will improve Outcome: Progressing   Problem: Metabolic: Goal: Ability to maintain appropriate glucose levels will improve Outcome: Progressing   Problem: Nutritional: Goal: Maintenance of adequate nutrition will improve Outcome: Progressing Goal: Progress toward achieving an optimal weight will improve Outcome: Progressing   Problem: Skin Integrity: Goal: Risk for impaired skin integrity will decrease Outcome: Progressing   Problem: Tissue Perfusion: Goal: Adequacy of tissue perfusion will improve Outcome: Progressing   Problem: Safety: Goal: Non-violent Restraint(s) Outcome: Progressing   Problem: Education: Goal: Knowledge of General Education information will improve Description: Including pain rating scale, medication(s)/side effects and non-pharmacologic comfort measures Outcome: Progressing   Problem: Health Behavior/Discharge Planning: Goal: Ability to manage health-related needs will  improve Outcome: Progressing   Problem: Clinical Measurements: Goal: Ability to maintain clinical measurements within normal limits will improve Outcome: Progressing Goal: Will remain free from infection Outcome: Progressing Goal: Diagnostic test results will improve Outcome: Progressing Goal: Respiratory complications will improve Outcome: Progressing Goal: Cardiovascular complication will be avoided Outcome: Progressing   Problem: Activity: Goal: Risk for activity intolerance will decrease Outcome: Progressing   Problem: Nutrition: Goal: Adequate nutrition will be maintained Outcome: Progressing   Problem: Coping: Goal: Level of anxiety will decrease Outcome: Progressing   Problem: Elimination: Goal: Will not experience complications related to bowel motility Outcome: Progressing Goal: Will not experience complications related to urinary retention Outcome: Progressing   Problem: Pain Managment: Goal: General experience of comfort will improve Outcome: Progressing   Problem: Safety: Goal: Ability to remain free from injury will improve Outcome: Progressing   Problem: Skin Integrity: Goal: Risk for impaired skin integrity will decrease Outcome: Progressing   Problem: Education: Goal: Knowledge of disease or condition will improve Outcome: Progressing Goal: Knowledge of secondary prevention will improve (MUST DOCUMENT ALL) Outcome: Progressing Goal: Knowledge of patient specific risk factors will improve Elta [place] N/A or DELETE if not current risk factor) Outcome: Progressing

## 2022-10-10 NOTE — Progress Notes (Signed)
PROGRESS NOTE  Randy Ross  LFY:[id number] DOB: 11-Jun-1971 DOA: 08/24/2022 PCP: [facility], [facility]   Brief Narrative: Patient is a 51 year old male with history of ESRD on dialysis, type 2 diabetes, hypertension who presented here with sudden onset of slurred speech, headache.  He was severely hypertensive on presentation.  CT head showed cerebral hemorrhage.  He was intubated in the emergency department for airway protection and admitted to PCCM/neurology service.  Self extubated on 11/30.  Currently hemodynamically stable.  PT/OT recommending Acute inpatient rehab on discharge.  Waiting for insurance Auth.  TOC following.  Prolonged hospital course.  Outpatient dialysis facility arranged.  Medically stable for discharge whenever possible  Assessment & Plan:  Principal Problem:   Acute cerebellar hemorrhage (HCC) Active Problems:   Anemia   ESRD (end stage renal disease) ([place])   Diabetes mellitus ([place])   Acute respiratory failure with hypoxia (HCC)   Hypertensive emergency   Pressure injury of skin   Acute metabolic encephalopathy and delirium   Cerebral edema (HCC)   Acute embolic stroke ([place])   Lower GI bleeding   Dysphagia   Palliative care by specialist   Acute intracerebral hemorrhage: Most likely secondary to poorly controlled hypertension.  Presented with sudden onset of mental change, left-sided weakness, slurred speech, left-sided gaze preference.  Severely hypertensive on presentation.  Was intubated and admitted to ICU and was placed on Cleviprex infusion.  CT scan showed acute intraparenchymal hematoma centered in the right cerebellar hemisphere with severe mass effect on the fourth ventricle and cerebral aqueduct.  Currently he is neurologically stable, neurology has signed off.  He should follow-up with neurology as an outpatient. PT/OT recommending acute inpatient rehab, waiting for insurance Auth.  ESRD on dialysis/hyperkalemia:  Nephrology following for dialysis.  Has a dialysis catheter on the right chest  Dysphagia: Initially required core track, now on oral diet.  Anemia of chronic disease/Lower GI bleed: Suspected to be have lower GI bleed during his hospitalization, hemoglobin stable.  No further workup.  Acute embolic CVA: MRI of the brain on 11/21 showed many new acute infract throughout the bilateral frontal, bilateral white matter consistent with embolism.  TTE showed EF of 50 to 55%, A1c 5.9, LDL of 91.  Not on any antiplatelet therapy due to cerebral hemorrhage.  Neurology does not recommend further evaluation for cardiac source of embolism because he is not a good candidate for anticoagulation  Hypertensive emergency: Currently blood pressure stable.  Continue amlodipine, Coreg, hydralazine  Aspiration pneumonia: Completed antibiotics course.  Currently on room air.  Acute metabolic encephalopathy: Most likely secondary to stroke.  Mental status is stable right now.  Remains impulsive at times.  Diabetes type 2: Well-controlled.  In addition A1c of 5.9.  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  Ordered Benadryl cream.  Resolved    Nutrition Problem: Inadequate oral intake Etiology: inability to eat    DVT prophylaxis:heparin injection 5,000 Units Start: 09/12/22 1445 Place and maintain sequential compression device Start: 08/25/22 1025     Code Status: Full Code  Family Communication: Girlfriend at bedside on 12/30  Patient status:Inpatient  Patient is from :Home  Anticipated discharge to:[facility]  Estimated DC date: Waiting for insurance Auth   Consultants: Neurology, PCCM  Procedures: Dialysis, intubation  Antimicrobials:  Anti-infectives (From admission, onward)    Start     Dose/Rate Route Frequency Ordered Stop  09/14/22 1145  ceFAZolin (ANCEF) IVPB 2g/100 mL premix         2 g 200 mL/hr over 30 Minutes Intravenous To Radiology 09/14/22 1123 09/15/22 0905   08/29/22 2000  cefTRIAXone (ROCEPHIN) 2 g in sodium chloride 0.9 % 100 mL IVPB        2 g 200 mL/hr over 30 Minutes Intravenous Every 24 hours 08/29/22 1009 09/01/22 2146   08/28/22 2000  ceFEPIme (MAXIPIME) 1 g in sodium chloride 0.9 % 100 mL IVPB  Status:  Discontinued        1 g 200 mL/hr over 30 Minutes Intravenous Every 24 hours 08/27/22 1009 08/27/22 1016   08/27/22 2000  ceFEPIme (MAXIPIME) 1 g in sodium chloride 0.9 % 100 mL IVPB  Status:  Discontinued        1 g 200 mL/hr over 30 Minutes Intravenous Every 24 hours 08/27/22 1016 08/29/22 1009   08/26/22 1000  ceFEPIme (MAXIPIME) 2 g in sodium chloride 0.9 % 100 mL IVPB  Status:  Discontinued        2 g 200 mL/hr over 30 Minutes Intravenous Every 12 hours 08/26/22 0850 08/27/22 1009       Subjective:  Patient seen and examined at bedside at dialysis site.  Comfortable, lyingon bed , was on dialysis.  No new complaints  Objective: Vitals:   10/10/22 1100 10/10/22 1107 10/10/22 1113 10/10/22 1141  BP: (!) 170/85 (!) 166/78 (!) 159/80 (!) 174/82  Pulse: 76 76 76 76  Resp: '14 16 17 17  '$ Temp:   98.3 F (36.8 C) 98.8 F (37.1 C)  TempSrc:    Oral  SpO2: 98%  97% 98%  Weight:   80 kg   Height:        Intake/Output Summary (Last 24 hours) at 10/10/2022 1454 Last data filed at 10/10/2022 1113 Gross per 24 hour  Intake 300 ml  Output 2000 ml  Net -1700 ml   Filed Weights   10/08/22 1431 10/10/22 0800 10/10/22 1113  Weight: 79.6 kg 81 kg 80 kg    Examination:  General exam: Overall comfortable, not in distress HEENT: PERRL Respiratory system:  no wheezes or crackles  Cardiovascular system: S1 & S2 heard, RRR.  Dialysis catheter on the right chest Gastrointestinal system: Abdomen is nondistended, soft and nontender. Central nervous system: Alert and oriented Extremities: No edema, no clubbing ,no cyanosis Skin: No rashes,  no ulcers,no icterus     Data Reviewed: I have personally reviewed following labs and imaging studies  CBC: Recent Labs  Lab 10/05/22 0501 10/06/22 0100 10/08/22 1118 10/10/22 0755  WBC 16.1* 8.7 11.1* 12.0*  HGB 10.6* 10.0* 10.4* 10.7*  HCT 33.5* 29.6* 32.8* 33.6*  MCV 71.9* 69.2* 71.6* 71.8*  PLT 325 287 296 440   Basic Metabolic Panel: Recent Labs  Lab 10/06/22 0100 10/06/22 1437 10/07/22 0130 10/08/22 1118 10/10/22 0755  NA 138 136 138 137 135  K 5.2* 4.0 4.4 4.9 5.0  CL 101 99 98 97* 97*  CO2 '23 30 27 25 27  '$ GLUCOSE 104* 255* 106* 81 84  BUN 75* 31* 41* 61* 55*  CREATININE 10.85* 5.90* 7.47* 10.45* 10.01*  CALCIUM 8.7* 8.1* 8.7* 8.7* 8.6*  PHOS  --  3.1  --  5.3* 5.9*     No results found for this or any previous visit (from the past 240 hour(s)).   Radiology Studies: No results found.  Scheduled Meds:  amLODipine  5 mg Oral BID   carvedilol  25 mg Oral BID WC   Chlorhexidine Gluconate Cloth  6 each Topical Q0600   Chlorhexidine Gluconate Cloth  6 each Topical Q0600   [START ON 10/18/2022] darbepoetin (ARANESP) injection - DIALYSIS  150 mcg Subcutaneous Q Mon-1800   darbepoetin (ARANESP) injection - DIALYSIS  150 mcg Subcutaneous Once   diphenhydrAMINE-zinc acetate   Topical TID   feeding supplement (NEPRO CARB STEADY)  237 mL Oral TID WC   heparin injection (subcutaneous)  5,000 Units Subcutaneous Q8H   hydrALAZINE  100 mg Oral Q8H   insulin aspart  0-15 Units Subcutaneous Q6H   insulin glargine-yfgn  10 Units Subcutaneous Daily   multivitamin  1 tablet Oral QHS   mouth rinse  15 mL Mouth Rinse 4 times per day   pantoprazole  40 mg Oral BID   QUEtiapine  25 mg Oral QHS   sevelamer carbonate  800 mg Oral TID WC   Continuous Infusions:  sodium chloride Stopped (08/30/22 0033)     LOS: 21 days   Shelly Coss, MD [facility] P12/31/2023, 2:54 PM

## 2022-10-11 LAB — GLUCOSE, CAPILLARY
Glucose-Capillary: 110 mg/dL — ABNORMAL HIGH (ref 70–99)
Glucose-Capillary: 166 mg/dL — ABNORMAL HIGH (ref 70–99)
Glucose-Capillary: 199 mg/dL — ABNORMAL HIGH (ref 70–99)
Glucose-Capillary: 72 mg/dL (ref 70–99)
Glucose-Capillary: 91 mg/dL (ref 70–99)

## 2022-10-11 LAB — TRIGLYCERIDES: Triglycerides: 66 mg/dL (ref <150)

## 2022-10-11 MED ORDER — DARBEPOETIN ALFA 150 MCG/0.3ML IJ SOSY
150.0000 ug | PREFILLED_SYRINGE | Freq: Once | INTRAMUSCULAR | Status: AC
  Administered 2022-10-11: 150 ug via SUBCUTANEOUS
  Filled 2022-10-11: qty 0.3

## 2022-10-11 MED ORDER — CHLORHEXIDINE GLUCONATE CLOTH 2 % EX PADS: 6.0000 | MEDICATED_PAD | Freq: Every day | CUTANEOUS | Status: DC

## 2022-10-11 MED ORDER — INSULIN ASPART 100 UNIT/ML IJ SOLN
3.0000 [IU] | INTRAMUSCULAR | Status: AC
  Administered 2022-10-11: 3 [IU] via SUBCUTANEOUS

## 2022-10-11 MED ORDER — CLONIDINE HCL 0.1 MG PO TABS
0.1000 mg | ORAL_TABLET | Freq: Three times a day (TID) | ORAL | Status: DC
  Administered 2022-10-11 – 2022-10-14 (×9): 0.1 mg via ORAL
  Filled 2022-10-11 (×9): qty 1

## 2022-10-11 NOTE — Progress Notes (Signed)
[facility] Progress Note   Subjective:   HD yest-  removed 2000-  BP did not drop -  some kind of hold up to go to inpatient rehab -  according to notes pt upset-  refusing lab draws and meds-  working with PT  Objective Vitals:   10/10/22 1700 10/10/22 2021 10/10/22 2333 10/11/22 0743  BP: (!) 172/89 (!) 153/71 (!) 152/78 (!) 178/86  Pulse: 80 80 75 79  Resp:  '18 18 18  '$ Temp: 98.2 F (36.8 C) 98.4 F (36.9 C) 98.3 F (36.8 C) 98.3 F (36.8 C)  TempSrc: Oral Oral Oral Oral  SpO2: 99% 98% 95% 98%  Weight:      Height:       Physical Exam General: Chronically ill appearing male in NAD Heart: RRR, no murmurs, rubs or gallops Lungs: CTA bilaterally without wheezing, rhonchi or rales Abdomen: Soft, non-distended, +BS Extremities: no edema b/l lower extremities Dialysis Access:  LUE AVF +t/b-  also [facility]   Additional Objective Labs: Basic Metabolic Panel: Recent Labs  Lab 10/06/22 1437 10/07/22 0130 10/08/22 1118 10/10/22 0755  NA 136 138 137 135  K 4.0 4.4 4.9 5.0  CL 99 98 97* 97*  CO2 '30 27 25 27  '$ GLUCOSE 255* 106* 81 84  BUN 31* 41* 61* 55*  CREATININE 5.90* 7.47* 10.45* 10.01*  CALCIUM 8.1* 8.7* 8.7* 8.6*  PHOS 3.1  --  5.3* 5.9*   Liver Function Tests: Recent Labs  Lab 10/06/22 1437 10/08/22 1118 10/10/22 0755  ALBUMIN 2.9* 3.0* 3.1*   No results for input(s): "LIPASE", "AMYLASE" in the last 168 hours. CBC: Recent Labs  Lab 10/05/22 0501 10/06/22 0100 10/08/22 1118 10/10/22 0755  WBC 16.1* 8.7 11.1* 12.0*  HGB 10.6* 10.0* 10.4* 10.7*  HCT 33.5* 29.6* 32.8* 33.6*  MCV 71.9* 69.2* 71.6* 71.8*  PLT 325 287 296 316   Blood Culture    Component Value Date/Time   SDES TRACHEAL ASPIRATE 08/26/2022 1212   SPECREQUEST NONE 08/26/2022 1212   CULT  08/26/2022 1212    ABUNDANT STREPTOCOCCUS ANGINOSIS RARE KLEBSIELLA OXYTOCA ABUNDANT HAEMOPHILUS INFLUENZAE BETA LACTAMASE NEGATIVE Performed at [facility] [address]  [place], [place] [address]    REPTSTATUS 08/29/2022 FINAL 08/26/2022 1212    Cardiac Enzymes: No results for input(s): "CKTOTAL", "CKMB", "CKMBINDEX", "TROPONINI" in the last 168 hours. CBG: Recent Labs  Lab 10/10/22 1143 10/10/22 1759 10/10/22 1955 10/10/22 2318 10/11/22 0333  GLUCAP 95 215* 191* 149* 110*   Iron Studies: No results for input(s): "IRON", "TIBC", "TRANSFERRIN", "FERRITIN" in the last 72 hours. '@lablastinr3'$ @ Studies/Results: No results found. Medications:  sodium chloride Stopped (08/30/22 0033)    amLODipine  5 mg Oral BID   carvedilol  25 mg Oral BID WC   Chlorhexidine Gluconate Cloth  6 each Topical Q0600   Chlorhexidine Gluconate Cloth  6 each Topical Q0600   cloNIDine  0.1 mg Oral Q8H   [START ON 10/18/2022] darbepoetin (ARANESP) injection - DIALYSIS  150 mcg Subcutaneous Q Mon-1800   darbepoetin (ARANESP) injection - DIALYSIS  150 mcg Subcutaneous Once   diphenhydrAMINE-zinc acetate   Topical TID   feeding supplement (NEPRO CARB STEADY)  237 mL Oral TID WC   heparin injection (subcutaneous)  5,000 Units Subcutaneous Q8H   hydrALAZINE  100 mg Oral Q8H   insulin aspart  0-15 Units Subcutaneous Q6H   insulin glargine-yfgn  10 Units Subcutaneous Daily   multivitamin  1 tablet Oral QHS   mouth  rinse  15 mL Mouth Rinse 4 times per day   pantoprazole  40 mg Oral BID   QUEtiapine  25 mg Oral QHS   sevelamer carbonate  800 mg Oral TID WC    Dialysis Orders: SW MWF 3h 21mn  80kg   400/800   2/2 bath  Hep 2000  LFA AVF - last HD 11/13, post 80.1kg - mircera 150 q2, last 11/13 - hectorol 477m IV q HD  Assessment/Plan: 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 S/P course of hypertonic fluids.  Now doing rehab, eating and talking. Plan is for inpatient rehab AMS - due to #1. Has had significant improvement.  HTN/ vol  - Volume appears stable. He is getting po BP meds. Avoid hypotension in HD. Volume seems fine.  ESRD - pt  required CRRT 11/15-11/17. Resumed IHD MWF and is tolerating well. Plan for next on Wednesday  HD access - BUN was very high so access recirc was suspected. IR did fistulogram 12/4 and AVF was found be small with competing veins. Using RIJ TDFontenelleow placed by IR on 12/6. Consulted [facility]-seen by Dr. CaDonzetta MattersNo plans documented yet.  Anemia of ESRD: Hgb 10.4, continue Aranesp 15045mweekly while here. Secondary HPTH: CCa controlled. Renvela 800 tid as binder-  dose seems to have been decreased. not on VDRA.  PO4 5.9-  if stays up will inc-  check labs with HD including PTH.  DM2 - per pmd  Nutrition: Continue renal diet  Disposition: Skilled SNF in [place] renal navigator, "[facility]. Pt can start on Wed "      KelLouis Meckel/10/2022, 9:33 AM  [employer]

## 2022-10-11 NOTE — Progress Notes (Signed)
PROGRESS NOTE  Randy Ross  WUX:[id number] DOB: January 22, 1971 DOA: 08/24/2022 PCP: [facility]   Brief Narrative: Patient is a 52 year old male with history of ESRD on dialysis, type 2 diabetes, hypertension who presented here with sudden onset of slurred speech, headache.  He was severely hypertensive on presentation.  CT head showed cerebral hemorrhage.  He was intubated in the emergency department for airway protection and admitted to PCCM/neurology service.  Self extubated on 11/30.  Currently hemodynamically stable.  PT/OT recommending Acute inpatient rehab on discharge.  Waiting for insurance Auth.  TOC following.  Prolonged hospital course.  Outpatient dialysis facility arranged.  Medically stable for discharge whenever possible  Assessment & Plan:  Principal Problem:   Acute cerebellar hemorrhage (HCC) Active Problems:   Anemia   ESRD (end stage renal disease) ([place])   Diabetes mellitus ([place])   Acute respiratory failure with hypoxia (HCC)   Hypertensive emergency   Pressure injury of skin   Acute metabolic encephalopathy and delirium   Cerebral edema (HCC)   Acute embolic stroke ([place])   Lower GI bleeding   Dysphagia   Palliative care by specialist   Acute intracerebral hemorrhage: Most likely secondary to poorly controlled hypertension.  Presented with sudden onset of mental change, left-sided weakness, slurred speech, left-sided gaze preference.  Severely hypertensive on presentation.  Was intubated and admitted to ICU and was placed on Cleviprex infusion.  CT scan showed acute intraparenchymal hematoma centered in the right cerebellar hemisphere with severe mass effect on the fourth ventricle and cerebral aqueduct.  Currently he is neurologically stable, neurology has signed off.  He should follow-up with neurology as an outpatient. PT/OT recommending acute inpatient rehab, waiting for insurance Auth.  ESRD on dialysis/hyperkalemia:  Nephrology following for dialysis.  Has a dialysis catheter on the right chest  Dysphagia: Initially required core track, now on oral diet.  Anemia of chronic disease/Lower GI bleed: Suspected to be have lower GI bleed during his hospitalization, hemoglobin stable.  No further workup.  Acute embolic CVA: MRI of the brain on 11/21 showed many new acute infract throughout the bilateral frontal, bilateral white matter consistent with embolism.  TTE showed EF of 50 to 55%, A1c 5.9, LDL of 91.  Not on any antiplatelet therapy due to cerebral hemorrhage.  Neurology does not recommend further evaluation for cardiac source of embolism because he is not a good candidate for anticoagulation  Hypertensive emergency: Currently blood pressure up,added clonidine.  Continue amlodipine, Coreg, hydralazine  Aspiration pneumonia: Completed antibiotics course.  Currently on room air.  Acute metabolic encephalopathy: Most likely secondary to stroke.  Mental status is stable right now.  Remains impulsive at times.  Diabetes type 2: Well-controlled.  In addition A1c of 5.9.  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  Ordered Benadryl cream.  Resolved    Nutrition Problem: Inadequate oral intake Etiology: inability to eat    DVT prophylaxis:heparin injection 5,000 Units Start: 09/12/22 1445 Place and maintain sequential compression device Start: 08/25/22 1025     Code Status: Full Code  Family Communication: Girlfriend at bedside on 1/1  Patient status:Inpatient  Patient is from :Home  Anticipated discharge to:[facility]  Estimated DC date: Waiting for insurance Auth   Consultants: Neurology, PCCM  Procedures: Dialysis, intubation  Antimicrobials:  Anti-infectives (From admission, onward)    Start     Dose/Rate Route Frequency Ordered Stop  09/14/22 1145  ceFAZolin (ANCEF) IVPB 2g/100  mL premix        2 g 200 mL/hr over 30 Minutes Intravenous To Radiology 09/14/22 1123 09/15/22 0905   08/29/22 2000  cefTRIAXone (ROCEPHIN) 2 g in sodium chloride 0.9 % 100 mL IVPB        2 g 200 mL/hr over 30 Minutes Intravenous Every 24 hours 08/29/22 1009 09/01/22 2146   08/28/22 2000  ceFEPIme (MAXIPIME) 1 g in sodium chloride 0.9 % 100 mL IVPB  Status:  Discontinued        1 g 200 mL/hr over 30 Minutes Intravenous Every 24 hours 08/27/22 1009 08/27/22 1016   08/27/22 2000  ceFEPIme (MAXIPIME) 1 g in sodium chloride 0.9 % 100 mL IVPB  Status:  Discontinued        1 g 200 mL/hr over 30 Minutes Intravenous Every 24 hours 08/27/22 1016 08/29/22 1009   08/26/22 1000  ceFEPIme (MAXIPIME) 2 g in sodium chloride 0.9 % 100 mL IVPB  Status:  Discontinued        2 g 200 mL/hr over 30 Minutes Intravenous Every 12 hours 08/26/22 0850 08/27/22 1009       Subjective:  Patient seen and examined at bedside today.  Hemodynamically stable.  Lying in bed.  Comfortable.  Denies new complaints  Objective: Vitals:   10/10/22 1700 10/10/22 2021 10/10/22 2333 10/11/22 0743  BP: (!) 172/89 (!) 153/71 (!) 152/78 (!) 178/86  Pulse: 80 80 75 79  Resp:  '18 18 18  '$ Temp: 98.2 F (36.8 C) 98.4 F (36.9 C) 98.3 F (36.8 C) 98.3 F (36.8 C)  TempSrc: Oral Oral Oral Oral  SpO2: 99% 98% 95% 98%  Weight:      Height:        Intake/Output Summary (Last 24 hours) at 10/11/2022 1158 Last data filed at 10/11/2022 0333 Gross per 24 hour  Intake --  Output 0 ml  Net 0 ml   Filed Weights   10/08/22 1431 10/10/22 0800 10/10/22 1113  Weight: 79.6 kg 81 kg 80 kg    Examination:  General exam: Overall comfortable, not in distress HEENT: PERRL Respiratory system:  no wheezes or crackles  Cardiovascular system: S1 & S2 heard, RRR.  Dialysis catheter on right chest Gastrointestinal system: Abdomen is nondistended, soft and nontender. Central nervous system: Alert and oriented Extremities: No edema, no  clubbing ,no cyanosis Skin: No rashes, no ulcers,no icterus     Data Reviewed: I have personally reviewed following labs and imaging studies  CBC: Recent Labs  Lab 10/05/22 0501 10/06/22 0100 10/08/22 1118 10/10/22 0755  WBC 16.1* 8.7 11.1* 12.0*  HGB 10.6* 10.0* 10.4* 10.7*  HCT 33.5* 29.6* 32.8* 33.6*  MCV 71.9* 69.2* 71.6* 71.8*  PLT 325 287 296 161   Basic Metabolic Panel: Recent Labs  Lab 10/06/22 0100 10/06/22 1437 10/07/22 0130 10/08/22 1118 10/10/22 0755  NA 138 136 138 137 135  K 5.2* 4.0 4.4 4.9 5.0  CL 101 99 98 97* 97*  CO2 '23 30 27 25 27  '$ GLUCOSE 104* 255* 106* 81 84  BUN 75* 31* 41* 61* 55*  CREATININE 10.85* 5.90* 7.47* 10.45* 10.01*  CALCIUM 8.7* 8.1* 8.7* 8.7* 8.6*  PHOS  --  3.1  --  5.3* 5.9*     No results found for this or any previous visit (from the past 240 hour(s)).   Radiology Studies: No results found.  Scheduled Meds:  amLODipine  5 mg Oral BID  carvedilol  25 mg Oral BID WC   Chlorhexidine Gluconate Cloth  6 each Topical Q0600   Chlorhexidine Gluconate Cloth  6 each Topical Q0600   cloNIDine  0.1 mg Oral Q8H   [START ON 10/18/2022] darbepoetin (ARANESP) injection - DIALYSIS  150 mcg Subcutaneous Q Mon-1800   darbepoetin (ARANESP) injection - DIALYSIS  150 mcg Subcutaneous Once   diphenhydrAMINE-zinc acetate   Topical TID   feeding supplement (NEPRO CARB STEADY)  237 mL Oral TID WC   heparin injection (subcutaneous)  5,000 Units Subcutaneous Q8H   hydrALAZINE  100 mg Oral Q8H   insulin aspart  0-15 Units Subcutaneous Q6H   insulin glargine-yfgn  10 Units Subcutaneous Daily   multivitamin  1 tablet Oral QHS   mouth rinse  15 mL Mouth Rinse 4 times per day   pantoprazole  40 mg Oral BID   QUEtiapine  25 mg Oral QHS   sevelamer carbonate  800 mg Oral TID WC   Continuous Infusions:  sodium chloride Stopped (08/30/22 0033)     LOS: 68 days   Shelly Coss, MD [facility] P1/10/2022, 11:58 AM

## 2022-10-11 NOTE — Progress Notes (Signed)
Mobility Specialist: Progress Note   10/11/22 0946  Mobility  Activity Ambulated with assistance in hallway  Level of Assistance Minimal assist, patient does 75% or more  Assistive Device Front wheel walker  Distance Ambulated (ft) 350 ft (+150' w/o AD)  Activity Response Tolerated well  Mobility Referral Yes  $Mobility charge 1 Mobility   Pt received in bed sleeping, easily awakened and agreeable to mobility. Mod I with bed mobility and minA for balance during ambulation. Started session with RW with cues for proximity and upright posture. No c/o throughout. Did second bout of ambulation w/o AD. LOB x3  d/t cross-over gait requiring minA-modA to correct. Pt back to bed after session with call bell in reach. Bed alarm is on.   [place] Jay Kempe Mobility Specialist Please contact via SecureChat or Rehab office at [phone]

## 2022-10-11 NOTE — Progress Notes (Signed)
Pt has been irritated all shift because he wants to go home and he doesn't know why the doctor is keeping him here. He isn't going to pay this bill. Pt refusing meds because he doesn't want to take them. Refusing all sticks including Aranesept, and po meds including Hydralazine. I explained to him if he doesn't take is bld thinners and is BP meds he is risking having another stroke and it can be a lot worse also if he is on the Kidney transplant list or hopes to be they will refuse him if he is noncompliant with meds and treatments so he better decide what he wants to do take his meds or risk another Stroke and/or not be allowed to get another kidney. Pt stated I don't care I'm not taking those meds and I'm going home today. Wife called me down to the rm around [phone] because she wanted me to restrain him because he was trying to take out his PIV and HD Catheter. I asked him what he was doing and he stated nothing. Wife interrupted and stated you are picking at those things. I explained to the pt if he took out his HD Catheter he would bleed to death really fast and if he took out the PIV we would start another then I would restrain him and he definitely wouldn't go home today. So he needs to leave them alone and take his meds if he hopes to go home today.

## 2022-10-12 DIAGNOSIS — N185 Chronic kidney disease, stage 5: Secondary | ICD-10-CM

## 2022-10-12 LAB — GLUCOSE, CAPILLARY
Glucose-Capillary: 79 mg/dL (ref 70–99)
Glucose-Capillary: 155 mg/dL — ABNORMAL HIGH (ref 70–99)
Glucose-Capillary: 144 mg/dL — ABNORMAL HIGH (ref 70–99)
Glucose-Capillary: 143 mg/dL — ABNORMAL HIGH (ref 70–99)

## 2022-10-12 NOTE — Progress Notes (Addendum)
OT Cancellation Note  Patient Details Name: Randy Ross MRN: [id number] DOB: 1970/12/20   Cancelled Treatment:    Reason Eval/Treat Not Completed: Patient declined, no reason specified (pt sleeping, reports "it's too early". Will follow up for OT tx as schedule permits.)  Renaye Rakers, OTD, OTR/L SecureChat Preferred Acute Rehab [phone]  Ulla Gallo 10/12/2022, 12:38 PM

## 2022-10-12 NOTE — Progress Notes (Addendum)
Hospital Consult  VASCULAR SURGERY ASSESSMENT & PLAN:   END-STAGE RENAL DISEASE: As per Dr. Claretha Cooper previous note, eventually he will need ligation of multiple competing branches of his left radiocephalic AV fistula and possible superficialization vs a new left upper arm fistula.  He has a functioning catheter.  He is to be discharged to rehab soon.  I offered to try to get him on the schedule for tomorrow, however he feels strongly about having this done as an outpatient.  For this reason we will arrange to do this as an outpatient in the next few weeks.   Gae Gallop, MD 9:53 AM   Reason for Consult:  malfunctioning left FA AVF Requesting Physician:  Moshe Cipro MRN #:  [id number]  History of Present Illness: This is a 52 y.o. male who was admitted to [facility] secondary to cerebellar hemorrhage/ bleed  w/ 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  He was seen by Dr. Donzetta Matters on 09/30/2022 for malfunctioning fistula.  His original AVF was created on 12/10/2021 by Dr. Donzetta Matters.   Pt did undergo fistulogram with IR that revealed competing branches.  It was discussed that pt would need branch ligation with superficialization or conversion to LUA AVF, which would be determined at time of surgery.  He felt the pt would need to be deferred to outpatient setting as the fistula would not be usable after revision and there would be some concern of dehiscence at the time.    Pt currently awaiting discharge to rehab facility and [facility] was asked to re-evaluate pt.   Pt had TDC placed on 09/15/2022 by IR.    Past Medical History:  Diagnosis Date   Anemia    CKD (chronic kidney disease)    on wed 12/09/21   Diabetes mellitus without complication ([place])    type 2   Hypertension    Renal disorder     Past Surgical History:  Procedure Laterality Date   AV FISTULA PLACEMENT Left 12/10/2021   Procedure: LEFT RADIAL CEPHALIC  ARTERIOVENOUS (AV) FISTULA;  Surgeon: Waynetta Sandy, MD;  Location: [facility];  Service:  Vascular;  Laterality: Left;   IR DIALY SHUNT INTRO [place] W/IMG LEFT Left 09/13/2022   IR FLUORO GUIDE CV LINE RIGHT  12/09/2021   IR FLUORO GUIDE CV LINE RIGHT  09/15/2022   IR US GUIDE VASC ACCESS RIGHT  12/09/2021   IR US GUIDE VASC ACCESS RIGHT  09/15/2022   LUMBAR [place] SURGERY  2021   L5-S1    Allergies  Allergen Reactions   Gabapentin Shortness Of Breath and Rash    Scratchy throat    Metformin And Related Other (See Comments)    Affecting kidney function     Prior to Admission medications   Medication Sig Start Date End Date Taking? Authorizing Provider  acetaminophen (TYLENOL) 325 MG tablet Take 2 tablets (650 mg total) by mouth every 6 (six) hours as needed for moderate pain. Patient taking differently: Take 650 mg by mouth as needed for moderate pain or headache. 05/19/22  Yes Jaynee Eagles, PA-C  amLODipine (NORVASC) 10 MG tablet Take 10 mg by mouth at bedtime. 04/23/22  Yes [provider]  carvedilol (COREG) 25 MG tablet Take 25 mg by mouth daily. 05/31/22  Yes [provider]  Emollient (CETAPHIL) cream Apply 1 application  topically daily as needed (itching).   Yes [provider]  isosorbide mononitrate (IMDUR) 30 MG 24 hr tablet Take 30 mg by mouth at bedtime. 04/23/22  Yes  [provider]  losartan (COZAAR) 100 MG tablet Take 100 mg by mouth daily. 03/15/22  Yes [provider]  PRESCRIPTION MEDICATION Inject 10 Units into the skin daily. [place]   Yes [provider]  calcitRIOL (ROCALTROL) 0.25 MCG capsule Take 1 capsule (0.25 mcg total) by mouth daily. 12/16/21   Elgergawy, Silver Huguenin, MD  calcium acetate (PHOSLO) 667 MG capsule Take 1,334 mg by mouth 3 (three) times daily. 03/31/22   [provider]  hydrALAZINE (APRESOLINE) 100 MG tablet Take 1 tablet (100 mg total) by mouth every 8 (eight) hours. 10/01/22   Caren Griffins, MD  multivitamin (RENA-VIT) TABS tablet Take 1 tablet by mouth at bedtime.  12/15/21   Elgergawy, Silver Huguenin, MD  QUEtiapine (SEROQUEL) 25 MG tablet Take 1 tablet (25 mg total) by mouth at bedtime. 10/01/22   Gherghe, Vella Redhead, MD  SEMGLEE, YFGN, 100 UNIT/ML Pen Inject 10 Units into the skin daily. 04/23/22   [provider]  tetrahydrozoline-zinc (VISINE-AC) 0.05-0.25 % ophthalmic solution Place 1 drop into both eyes 3 (three) times daily as needed (dry eyes).    [provider]    Social History   Socioeconomic History   Marital status: Single    Spouse name: Not on file   Number of children: Not on file   Years of education: Not on file   Highest education level: Not on file  Occupational History   Not on file  Tobacco Use   Smoking status: Some Days    Packs/day: 0.15    Types: Cigarettes   Smokeless tobacco: Never  Vaping Use   Vaping Use: Never used  Substance and Sexual Activity   Alcohol use: Not Currently   Drug use: Not Currently   Sexual activity: Not Currently  Other Topics Concern   Not on file  Social History Narrative   ** Merged History Encounter **       Social Determinants of Health   Financial Resource Strain: Not on file  Food Insecurity: Not on file  Transportation Needs: Not on file  Physical Activity: Not on file  Stress: Not on file  Social Connections: Not on file  Intimate Partner Violence: Not on file    General:  No distress Lungs:  non labored Extremities:  left radial pulse is palpable.  There is a thrill distally and becomes somewhat pulsatile proximally.     ASSESSMENT/PLAN: This is a 52 y.o. male with ESRD with malfunctioning left forearm AVF that was originally created 12/10/2021 by Dr. Donzetta Matters.  Pt has TDC that was placed 09/15/2022 by IR.    -per Dr. Claretha Cooper note on 09/30/2022, would defer revision with superficialization or create new LUA AVF (to be determined in the operating room) in the outpatient setting.  Pt to be discharged to rehab soon.  Will plan to schedule this as outpatient but may  possibly be able to schedule him for tomorrow if he is not discharged prior.   -Dr. Scot Dock to evaluate pt and determine further plan   Leontine Locket, PA-C [facility] [phone]

## 2022-10-12 NOTE — Progress Notes (Signed)
Per RN CM, insurance auth for rehab is still pending. [facility] and spoke to [facility]. Clinic aware pt will likely not start tomorrow. Should insurance approve rehab today and pt is d/c, then will contact clinic with update. Will assist as needed.   [name] Renal Navigator [phone]

## 2022-10-12 NOTE — Progress Notes (Signed)
Physical Therapy Treatment Patient Details Name: Randy Ross MRN: [id number] DOB: Sep 10, 1971 Today's Date: 10/12/2022   History of Present Illness 52 y.o. male presented 08/24/22 with slurred speech and headache. Head CT Rt cerebellar hemorrhage; intubated 11/14, self-extubated 11/30; CRRT; MRI brain 11/21 many small acute infarcts; PMH significant for end-stage renal disease on intermittent hemodialysis, diabetes, hypertension    PT Comments    Focused on dynamic balance and gait training. Assessed without RW and required min assist to maintain balance. Patient states he is very dizzy, but cannot tell if he is having vertigo--he doesn't answer when asked if objects appear to be moving. His poor balance with staggering steps point to him having vertigo. He will do better with RW at this time. May try having pt focus on a target (while wearing his glasses) while walking.     Recommendations for follow up therapy are one component of a multi-disciplinary discharge planning process, led by the attending physician.  Recommendations may be updated based on patient status, additional functional criteria and insurance authorization.  Follow Up Recommendations  Acute inpatient rehab (3hours/day) Can patient physically be transported by private vehicle: Yes   Assistance Recommended at Discharge Frequent or constant Supervision/Assistance  Patient can return home with the following Assistance with cooking/housework;Assistance with feeding;Direct supervision/assist for medications management;Direct supervision/assist for financial management;Assist for transportation;Help with stairs or ramp for entrance;A little help with walking and/or transfers;A little help with bathing/dressing/bathroom   Equipment Recommendations  Other (comment) (To be determined)    Recommendations for Other Services Rehab consult     Precautions / Restrictions Precautions Precautions: Fall Precaution Comments:  pulled out multiple cortraks Restrictions Weight Bearing Restrictions: No     Mobility  Bed Mobility Overal bed mobility: Needs Assistance Bed Mobility: Supine to Sit, Sit to Supine     Supine to sit: Supervision, HOB elevated Sit to supine: Supervision   General bed mobility comments: supervision for safety due to impulsivitiy    Transfers Overall transfer level: Needs assistance Equipment used: None Transfers: Sit to/from Stand Sit to Stand: Min guard           General transfer comment: min guard for safety without use of RW    Ambulation/Gait Ambulation/Gait assistance: Min assist Gait Distance (Feet): 200 Feet Assistive device: None Gait Pattern/deviations: Step-through pattern, Decreased stride length, Staggering left, Staggering right Gait velocity: decr Gait velocity interpretation: 1.31 - 2.62 ft/sec, indicative of limited community ambulator   General Gait Details: focused on recovering his balance during gait; pt able to recover without assist numerous times, however x3 needed min assist to recover balance.   Stairs             Wheelchair Mobility    Modified Rankin (Stroke Patients Only) Modified Rankin (Stroke Patients Only) Pre-Morbid Rankin Score: No symptoms Modified Rankin: Moderately severe disability     Balance Overall balance assessment: Needs assistance Sitting-balance support: No upper extremity supported, Feet unsupported Sitting balance-Leahy Scale: Fair Sitting balance - Comments: Min guard A for safety   Standing balance support: During functional activity, No upper extremity supported Standing balance-Leahy Scale: Fair Standing balance comment: able to stand statically without assist, BUE using RW needed for mobility                            Cognition Arousal/Alertness: Awake/alert Behavior During Therapy: Impulsive Overall Cognitive Status: Difficult to assess Area of Impairment: Attention, Following  commands, Safety/judgement, Awareness, Problem solving  Current Attention Level: Sustained   Following Commands: Follows one step commands with increased time Safety/Judgement: Decreased awareness of safety, Decreased awareness of deficits Awareness: Intellectual Problem Solving: Slow processing, Difficulty sequencing, Requires verbal cues General Comments: pt either doesn't answer questions or is difficult to understand, therefore difficult to assess/challenge cognition. Patient able to report fatigue and need to return to room (which is an improvement)        Exercises      General Comments General comments (skin integrity, edema, etc.): mother present      Pertinent Vitals/Pain Pain Assessment Pain Assessment: No/denies pain    Home Living                          Prior Function            PT Goals (current goals can now be found in the care plan section) Acute Rehab PT Goals Patient Stated Goal: unable PT Goal Formulation: With patient Time For Goal Achievement: 10/26/22 Potential to Achieve Goals: Good Progress towards PT goals:  (Goals updated based on timeframe)    Frequency    Min 3X/week      PT Plan Current plan remains appropriate    Co-evaluation              AM-PAC PT "6 Clicks" Mobility   Outcome Measure  Help needed turning from your back to your side while in a flat bed without using bedrails?: A Little Help needed moving from lying on your back to sitting on the side of a flat bed without using bedrails?: A Little Help needed moving to and from a bed to a chair (including a wheelchair)?: A Little Help needed standing up from a chair using your arms (e.g., wheelchair or bedside chair)?: A Little Help needed to walk in hospital room?: A Little Help needed climbing 3-5 steps with a railing? : Total 6 Click Score: 16    End of Session Equipment Utilized During Treatment: Gait belt Activity Tolerance:  Patient limited by fatigue Patient left: in bed;with bed alarm set;with call bell/phone within reach;with family/visitor present   PT Visit Diagnosis: Other abnormalities of gait and mobility (R26.89);Other symptoms and signs involving the nervous system (R29.898)     Time: 8341-9622 PT Time Calculation (min) (ACUTE ONLY): 9 min  Charges:  $Gait Training: 8-22 mins                      Arby Barrette, PT [place]  Office [phone]    [name] 10/12/2022, 4:19 PM

## 2022-10-12 NOTE — Progress Notes (Signed)
PROGRESS NOTE  Randy Ross  POE:[id number] DOB: 1971/03/23 DOA: 08/24/2022 PCP: [facility], [facility]   Brief Narrative: Patient is a 52 year old male with history of ESRD on dialysis, type 2 diabetes, hypertension who presented here with sudden onset of slurred speech, headache.  He was severely hypertensive on presentation.  CT head showed cerebral hemorrhage.  He was intubated in the emergency department for airway protection and admitted to PCCM/neurology service.  Self extubated on 11/30.  Currently hemodynamically stable.  PT/OT recommending Acute inpatient rehab on discharge.  Waiting for insurance Auth.  TOC following.  Prolonged hospital course.  Outpatient dialysis facility arranged.  Medically stable for discharge whenever possible to rehab  Assessment & Plan:  Principal Problem:   Acute cerebellar hemorrhage ([place]) Active Problems:   Anemia   ESRD (end stage renal disease) ([place])   Diabetes mellitus ([place])   Acute respiratory failure with hypoxia ([place])   Hypertensive emergency   Pressure injury of skin   Acute metabolic encephalopathy and delirium   Cerebral edema (HCC)   Acute embolic stroke ([place])   Lower GI bleeding   Dysphagia   Palliative care by specialist   Acute intracerebral hemorrhage: Most likely secondary to poorly controlled hypertension.  Presented with sudden onset of mental change, left-sided weakness, slurred speech, left-sided gaze preference.  Severely hypertensive on presentation.  Was intubated and admitted to ICU and was placed on Cleviprex infusion.  CT scan showed acute intraparenchymal hematoma centered in the right cerebellar hemisphere with severe mass effect on the fourth ventricle and cerebral aqueduct.  Currently he is neurologically stable, neurology has signed off.  He should follow-up with neurology as an outpatient. PT/OT recommending acute inpatient rehab, waiting for insurance Auth.  ESRD on  dialysis/hyperkalemia: Nephrology following for dialysis.  Has a dialysis catheter on the right chest.  He will follow-up with vascular surgery as an outpatient for AV fistula creation/superficialization.  Dysphagia: Initially required core track, now on oral diet.  Anemia of chronic disease/Lower GI bleed: Suspected to be have lower GI bleed during his hospitalization, hemoglobin stable.  No further workup.  Acute embolic CVA: MRI of the brain on 11/21 showed many new acute infract throughout the bilateral frontal, bilateral white matter consistent with embolism.  TTE showed EF of 50 to 55%, A1c 5.9, LDL of 91.  Not on any antiplatelet therapy due to cerebral hemorrhage.  Neurology does not recommend further evaluation for cardiac source of embolism because he is not a good candidate for anticoagulation  Hypertensive emergency: Currently blood pressure up,added clonidine.  Continue amlodipine, Coreg, hydralazine  Aspiration pneumonia: Completed antibiotics course.  Currently on room air.  Acute metabolic encephalopathy: Most likely secondary to stroke.  Mental status is stable right now.  Remains impulsive at times.  Diabetes type 2: Well-controlled.  In addition A1c of 5.9.  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  Ordered Benadryl cream.  Resolved    Nutrition Problem: Inadequate oral intake Etiology: inability to eat    DVT prophylaxis:heparin injection 5,000 Units Start: 09/12/22 1445 Place and maintain sequential compression device Start: 08/25/22 1025     Code Status: Full Code  Family Communication: Girlfriend at bedside on 1/1  Patient status:Inpatient  Patient is from :Home  Anticipated discharge to:[facility]  Estimated DC date: Waiting for insurance Auth   Consultants: Neurology, PCCM  Procedures: Dialysis, intubation  Antimicrobials:  Anti-infectives  (  From admission, onward)    Start     Dose/Rate Route Frequency Ordered Stop   09/14/22 1145  ceFAZolin (ANCEF) IVPB 2g/100 mL premix        2 g 200 mL/hr over 30 Minutes Intravenous To Radiology 09/14/22 1123 09/15/22 0905   08/29/22 2000  cefTRIAXone (ROCEPHIN) 2 g in sodium chloride 0.9 % 100 mL IVPB        2 g 200 mL/hr over 30 Minutes Intravenous Every 24 hours 08/29/22 1009 09/01/22 2146   08/28/22 2000  ceFEPIme (MAXIPIME) 1 g in sodium chloride 0.9 % 100 mL IVPB  Status:  Discontinued        1 g 200 mL/hr over 30 Minutes Intravenous Every 24 hours 08/27/22 1009 08/27/22 1016   08/27/22 2000  ceFEPIme (MAXIPIME) 1 g in sodium chloride 0.9 % 100 mL IVPB  Status:  Discontinued        1 g 200 mL/hr over 30 Minutes Intravenous Every 24 hours 08/27/22 1016 08/29/22 1009   08/26/22 1000  ceFEPIme (MAXIPIME) 2 g in sodium chloride 0.9 % 100 mL IVPB  Status:  Discontinued        2 g 200 mL/hr over 30 Minutes Intravenous Every 12 hours 08/26/22 0850 08/27/22 1009       Subjective:  Patient seen and examined the bedside today.  Hemodynamically stable.  Lying in bed.  Denies any new complaints today.  Objective: Vitals:   10/11/22 1956 10/11/22 2350 10/12/22 0349 10/12/22 0739  BP: 137/68 (!) 145/60 (!) 161/78 134/70  Pulse: 73 76 67 66  Resp: '17 13 16 17  '$ Temp: 97.9 F (36.6 C) 98.6 F (37 C) 98 F (36.7 C) 98.4 F (36.9 C)  TempSrc: Axillary Axillary Axillary Oral  SpO2: 99% 100% 98% 97%  Weight:      Height:       No intake or output data in the 24 hours ending 10/12/22 1155  Filed Weights   10/08/22 1431 10/10/22 0800 10/10/22 1113  Weight: 79.6 kg 81 kg 80 kg    Examination:  General exam: Overall comfortable, not in distress HEENT: PERRL Respiratory system:  no wheezes or crackles  Cardiovascular system: S1 & S2 heard, RRR.  Dialysis catheter on the right chest Gastrointestinal system: Abdomen is nondistended, soft and nontender. Central nervous system: Alert  and oriented Extremities: No edema, no clubbing ,no cyanosis Skin: No rashes, no ulcers,no icterus     Data Reviewed: I have personally reviewed following labs and imaging studies  CBC: Recent Labs  Lab 10/06/22 0100 10/08/22 1118 10/10/22 0755  WBC 8.7 11.1* 12.0*  HGB 10.0* 10.4* 10.7*  HCT 29.6* 32.8* 33.6*  MCV 69.2* 71.6* 71.8*  PLT 287 296 235   Basic Metabolic Panel: Recent Labs  Lab 10/06/22 0100 10/06/22 1437 10/07/22 0130 10/08/22 1118 10/10/22 0755  NA 138 136 138 137 135  K 5.2* 4.0 4.4 4.9 5.0  CL 101 99 98 97* 97*  CO2 '23 30 27 25 27  '$ GLUCOSE 104* 255* 106* 81 84  BUN 75* 31* 41* 61* 55*  CREATININE 10.85* 5.90* 7.47* 10.45* 10.01*  CALCIUM 8.7* 8.1* 8.7* 8.7* 8.6*  PHOS  --  3.1  --  5.3* 5.9*     No results found for this or any previous visit (from the past 240 hour(s)).   Radiology Studies: No results found.  Scheduled Meds:  amLODipine  5 mg Oral BID   carvedilol  25 mg Oral BID WC  Chlorhexidine Gluconate Cloth  6 each Topical Q0600   cloNIDine  0.1 mg Oral Q8H   [START ON 10/18/2022] darbepoetin (ARANESP) injection - DIALYSIS  150 mcg Subcutaneous Q Mon-1800   diphenhydrAMINE-zinc acetate   Topical TID   feeding supplement (NEPRO CARB STEADY)  237 mL Oral TID WC   heparin injection (subcutaneous)  5,000 Units Subcutaneous Q8H   hydrALAZINE  100 mg Oral Q8H   insulin aspart  0-15 Units Subcutaneous Q6H   insulin glargine-yfgn  10 Units Subcutaneous Daily   multivitamin  1 tablet Oral QHS   mouth rinse  15 mL Mouth Rinse 4 times per day   pantoprazole  40 mg Oral BID   QUEtiapine  25 mg Oral QHS   sevelamer carbonate  800 mg Oral TID WC   Continuous Infusions:  sodium chloride Stopped (08/30/22 0033)     LOS: 13 days   Shelly Coss, MD [facility] P1/11/2022, 11:55 AM

## 2022-10-12 NOTE — TOC Progression Note (Signed)
Transition of Care [facility]) - Progression Note    Patient Details  Name: Randy Ross MRN: [id number] Date of Birth: 1971-05-21  Transition [facility] CM/SW Contact  Pollie Friar, RN Phone Number: 10/12/2022, 1:59 PM  Clinical Narrative:    Received information from [facility] this am that his case has been escalated through [place].  CM called [employer] this afternoon and they can not tell me anything but that its escalated and we should hear something in 24 hours. If not to call them back. His case id: [id number]  TOC following.   Expected Discharge Plan: [place] Barriers to Discharge: Continued Medical Work up  Expected Discharge Plan and Services       Living arrangements for the past 2 months: Single Family Home                                       Social Determinants of Health (SDOH) Interventions SDOH Screenings   Tobacco Use: High Risk (09/15/2022)    Readmission Risk Interventions     No data to display

## 2022-10-12 NOTE — H&P (View-Only) (Signed)
Hospital Consult  VASCULAR SURGERY ASSESSMENT & PLAN:   END-STAGE RENAL DISEASE: As per Dr. Claretha Cooper previous note, eventually he will need ligation of multiple competing branches of his left radiocephalic AV fistula and possible superficialization vs a new left upper arm fistula.  He has a functioning catheter.  He is to be discharged to rehab soon.  I offered to try to get him on the schedule for tomorrow, however he feels strongly about having this done as an outpatient.  For this reason we will arrange to do this as an outpatient in the next few weeks.   Gae Gallop, MD 9:53 AM   Reason for Consult:  malfunctioning left FA AVF Requesting Physician:  Moshe Cipro MRN #:  [id number]  History of Present Illness: This is a 52 y.o. male who was admitted to [facility] secondary to cerebellar hemorrhage/ bleed  w/ 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  He was seen by Dr. Donzetta Matters on 09/30/2022 for malfunctioning fistula.  His original AVF was created on 12/10/2021 by Dr. Donzetta Matters.   Pt did undergo fistulogram with IR that revealed competing branches.  It was discussed that pt would need branch ligation with superficialization or conversion to LUA AVF, which would be determined at time of surgery.  He felt the pt would need to be deferred to outpatient setting as the fistula would not be usable after revision and there would be some concern of dehiscence at the time.    Pt currently awaiting discharge to rehab facility and [facility] was asked to re-evaluate pt.   Pt had TDC placed on 09/15/2022 by IR.    Past Medical History:  Diagnosis Date   Anemia    CKD (chronic kidney disease)    on wed 12/09/21   Diabetes mellitus without complication ([place])    type 2   Hypertension    Renal disorder     Past Surgical History:  Procedure Laterality Date   AV FISTULA PLACEMENT Left 12/10/2021   Procedure: LEFT RADIAL CEPHALIC  ARTERIOVENOUS (AV) FISTULA;  Surgeon: Waynetta Sandy, MD;  Location: [facility];  Service:  Vascular;  Laterality: Left;   IR DIALY SHUNT INTRO [place] W/IMG LEFT Left 09/13/2022   IR FLUORO GUIDE CV LINE RIGHT  12/09/2021   IR FLUORO GUIDE CV LINE RIGHT  09/15/2022   IR US GUIDE VASC ACCESS RIGHT  12/09/2021   IR US GUIDE VASC ACCESS RIGHT  09/15/2022   LUMBAR [place] SURGERY  2021   L5-S1    Allergies  Allergen Reactions   Gabapentin Shortness Of Breath and Rash    Scratchy throat    Metformin And Related Other (See Comments)    Affecting kidney function     Prior to Admission medications   Medication Sig Start Date End Date Taking? Authorizing Provider  acetaminophen (TYLENOL) 325 MG tablet Take 2 tablets (650 mg total) by mouth every 6 (six) hours as needed for moderate pain. Patient taking differently: Take 650 mg by mouth as needed for moderate pain or headache. 05/19/22  Yes Jaynee Eagles, PA-C  amLODipine (NORVASC) 10 MG tablet Take 10 mg by mouth at bedtime. 04/23/22  Yes [provider]  carvedilol (COREG) 25 MG tablet Take 25 mg by mouth daily. 05/31/22  Yes [provider]  Emollient (CETAPHIL) cream Apply 1 application  topically daily as needed (itching).   Yes [provider]  isosorbide mononitrate (IMDUR) 30 MG 24 hr tablet Take 30 mg by mouth at bedtime. 04/23/22  Yes  [provider]  losartan (COZAAR) 100 MG tablet Take 100 mg by mouth daily. 03/15/22  Yes [provider]  PRESCRIPTION MEDICATION Inject 10 Units into the skin daily. [place]   Yes [provider]  calcitRIOL (ROCALTROL) 0.25 MCG capsule Take 1 capsule (0.25 mcg total) by mouth daily. 12/16/21   Elgergawy, Silver Huguenin, MD  calcium acetate (PHOSLO) 667 MG capsule Take 1,334 mg by mouth 3 (three) times daily. 03/31/22   [provider]  hydrALAZINE (APRESOLINE) 100 MG tablet Take 1 tablet (100 mg total) by mouth every 8 (eight) hours. 10/01/22   Caren Griffins, MD  multivitamin (RENA-VIT) TABS tablet Take 1 tablet by mouth at bedtime.  12/15/21   Elgergawy, Silver Huguenin, MD  QUEtiapine (SEROQUEL) 25 MG tablet Take 1 tablet (25 mg total) by mouth at bedtime. 10/01/22   Gherghe, Vella Redhead, MD  SEMGLEE, YFGN, 100 UNIT/ML Pen Inject 10 Units into the skin daily. 04/23/22   [provider]  tetrahydrozoline-zinc (VISINE-AC) 0.05-0.25 % ophthalmic solution Place 1 drop into both eyes 3 (three) times daily as needed (dry eyes).    [provider]    Social History   Socioeconomic History   Marital status: Single    Spouse name: Not on file   Number of children: Not on file   Years of education: Not on file   Highest education level: Not on file  Occupational History   Not on file  Tobacco Use   Smoking status: Some Days    Packs/day: 0.15    Types: Cigarettes   Smokeless tobacco: Never  Vaping Use   Vaping Use: Never used  Substance and Sexual Activity   Alcohol use: Not Currently   Drug use: Not Currently   Sexual activity: Not Currently  Other Topics Concern   Not on file  Social History Narrative   ** Merged History Encounter **       Social Determinants of Health   Financial Resource Strain: Not on file  Food Insecurity: Not on file  Transportation Needs: Not on file  Physical Activity: Not on file  Stress: Not on file  Social Connections: Not on file  Intimate Partner Violence: Not on file    General:  No distress Lungs:  non labored Extremities:  left radial pulse is palpable.  There is a thrill distally and becomes somewhat pulsatile proximally.     ASSESSMENT/PLAN: This is a 52 y.o. male with ESRD with malfunctioning left forearm AVF that was originally created 12/10/2021 by Dr. Donzetta Matters.  Pt has TDC that was placed 09/15/2022 by IR.    -per Dr. Claretha Cooper note on 09/30/2022, would defer revision with superficialization or create new LUA AVF (to be determined in the operating room) in the outpatient setting.  Pt to be discharged to rehab soon.  Will plan to schedule this as outpatient but may  possibly be able to schedule him for tomorrow if he is not discharged prior.   -Dr. Scot Dock to evaluate pt and determine further plan   Leontine Locket, PA-C [facility] [phone]

## 2022-10-12 NOTE — Progress Notes (Signed)
[facility] Progress Note   Subjective:   no new issues -  looks fine -  seems to be independent with most things  Objective Vitals:   10/11/22 1956 10/11/22 2350 10/12/22 0349 10/12/22 0739  BP: 137/68 (!) 145/60 (!) 161/78 134/70  Pulse: 73 76 67 66  Resp: '17 13 16 17  '$ Temp: 97.9 F (36.6 C) 98.6 F (37 C) 98 F (36.7 C) 98.4 F (36.9 C)  TempSrc: Axillary Axillary Axillary Oral  SpO2: 99% 100% 98% 97%  Weight:      Height:       Physical Exam General: male in NAD-  talking eating in NAD Heart: RRR, no murmurs, rubs or gallops Lungs: CTA bilaterally without wheezing, rhonchi or rales Abdomen: Soft, non-distended, +BS Extremities: no edema b/l lower extremities Dialysis Access:  LUE AVF +t/b-  also [facility]   Additional Objective Labs: Basic Metabolic Panel: Recent Labs  Lab 10/06/22 1437 10/07/22 0130 10/08/22 1118 10/10/22 0755  NA 136 138 137 135  K 4.0 4.4 4.9 5.0  CL 99 98 97* 97*  CO2 '30 27 25 27  '$ GLUCOSE 255* 106* 81 84  BUN 31* 41* 61* 55*  CREATININE 5.90* 7.47* 10.45* 10.01*  CALCIUM 8.1* 8.7* 8.7* 8.6*  PHOS 3.1  --  5.3* 5.9*   Liver Function Tests: Recent Labs  Lab 10/06/22 1437 10/08/22 1118 10/10/22 0755  ALBUMIN 2.9* 3.0* 3.1*   No results for input(s): "LIPASE", "AMYLASE" in the last 168 hours. CBC: Recent Labs  Lab 10/06/22 0100 10/08/22 1118 10/10/22 0755  WBC 8.7 11.1* 12.0*  HGB 10.0* 10.4* 10.7*  HCT 29.6* 32.8* 33.6*  MCV 69.2* 71.6* 71.8*  PLT 287 296 316   Blood Culture    Component Value Date/Time   SDES TRACHEAL ASPIRATE 08/26/2022 1212   SPECREQUEST NONE 08/26/2022 1212   CULT  08/26/2022 1212    ABUNDANT STREPTOCOCCUS ANGINOSIS RARE KLEBSIELLA OXYTOCA ABUNDANT HAEMOPHILUS INFLUENZAE BETA LACTAMASE NEGATIVE Performed at [facility], [place] [address] [place], [place] [address]    REPTSTATUS 08/29/2022 FINAL 08/26/2022 1212    Cardiac Enzymes: No results for input(s): "CKTOTAL", "CKMB",  "CKMBINDEX", "TROPONINI" in the last 168 hours. CBG: Recent Labs  Lab 10/11/22 1226 10/11/22 1503 10/11/22 1733 10/11/22 2358 10/12/22 0624  GLUCAP 91 199* 72 166* 79   Iron Studies: No results for input(s): "IRON", "TIBC", "TRANSFERRIN", "FERRITIN" in the last 72 hours. '@lablastinr3'$ @ Studies/Results: No results found. Medications:  sodium chloride Stopped (08/30/22 0033)    amLODipine  5 mg Oral BID   carvedilol  25 mg Oral BID WC   Chlorhexidine Gluconate Cloth  6 each Topical Q0600   cloNIDine  0.1 mg Oral Q8H   [START ON 10/18/2022] darbepoetin (ARANESP) injection - DIALYSIS  150 mcg Subcutaneous Q Mon-1800   diphenhydrAMINE-zinc acetate   Topical TID   feeding supplement (NEPRO CARB STEADY)  237 mL Oral TID WC   heparin injection (subcutaneous)  5,000 Units Subcutaneous Q8H   hydrALAZINE  100 mg Oral Q8H   insulin aspart  0-15 Units Subcutaneous Q6H   insulin glargine-yfgn  10 Units Subcutaneous Daily   multivitamin  1 tablet Oral QHS   mouth rinse  15 mL Mouth Rinse 4 times per day   pantoprazole  40 mg Oral BID   QUEtiapine  25 mg Oral QHS   sevelamer carbonate  800 mg Oral TID WC    Dialysis Orders: SW MWF 3h 64mn  80kg   400/800  2/2 bath  Hep 2000  LFA AVF - last HD 11/13, post 80.1kg - mircera 150 q2, last 11/13 - hectorol 31mg IV q HD  Assessment/Plan: 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 S/P course of hypertonic fluids.  Now doing rehab, eating and talking. Plan is for inpatient rehab AMS - due to #1. Has had significant improvement.  HTN/ vol  - Volume appears stable. He is getting po BP meds. Avoid hypotension in HD. Volume seems fine.  ESRD - pt required CRRT 11/15-11/17. Resumed IHD MWF and is tolerating well. Plan for next on Wednesday  HD access - BUN was very high so access recirc was suspected. IR did fistulogram 12/4 and AVF was found be small with competing veins. Using RIJ TFairleenow placed by IR on 12/6.  Consulted VVS-seen by Dr. CDonzetta Matters No plans documented yet-  will reach out to them.  Anemia of ESRD: Hgb 10.4, continue Aranesp 1529m weekly while here. Secondary HPTH: CCa controlled. Renvela 800 tid as binder-  dose seems to have been decreased. not on VDRA.  PO4 5.9-  if stays up will inc-  check labs with HD tomorrow including PTH.  DM2 - per pmd  Nutrition: Continue renal diet  [place] "[facility]. Pt can start on Wed "  [facility]    [facility]/11/2022, 8:57 AM  [employer]

## 2022-10-13 LAB — CBC
HCT: 33.5 % — ABNORMAL LOW (ref 39.0–52.0)
Hemoglobin: 10.7 g/dL — ABNORMAL LOW (ref 13.0–17.0)
MCH: 22.6 pg — ABNORMAL LOW (ref 26.0–34.0)
MCHC: 31.9 g/dL (ref 30.0–36.0)
MCV: 70.7 fL — ABNORMAL LOW (ref 80.0–100.0)
Platelets: 245 10*3/uL (ref 150–400)
RBC: 4.74 MIL/uL (ref 4.22–5.81)
RDW: 19.5 % — ABNORMAL HIGH (ref 11.5–15.5)
WBC: 9.1 10*3/uL (ref 4.0–10.5)
nRBC: 0 % (ref 0.0–0.2)

## 2022-10-13 LAB — RENAL FUNCTION PANEL
Albumin: 2.8 g/dL — ABNORMAL LOW (ref 3.5–5.0)
Anion gap: 19 — ABNORMAL HIGH (ref 5–15)
BUN: 70 mg/dL — ABNORMAL HIGH (ref 6–20)
CO2: 21 mmol/L — ABNORMAL LOW (ref 22–32)
Calcium: 8.6 mg/dL — ABNORMAL LOW (ref 8.9–10.3)
Chloride: 95 mmol/L — ABNORMAL LOW (ref 98–111)
Creatinine, Ser: 11.94 mg/dL — ABNORMAL HIGH (ref 0.61–1.24)
GFR, Estimated: 5 mL/min — ABNORMAL LOW (ref >60)
Glucose, Bld: 74 mg/dL (ref 70–99)
Phosphorus: 7.6 mg/dL — ABNORMAL HIGH (ref 2.5–4.6)
Potassium: 5.5 mmol/L — ABNORMAL HIGH (ref 3.5–5.1)
Sodium: 135 mmol/L (ref 135–145)

## 2022-10-13 LAB — GLUCOSE, CAPILLARY
Glucose-Capillary: 113 mg/dL — ABNORMAL HIGH (ref 70–99)
Glucose-Capillary: 230 mg/dL — ABNORMAL HIGH (ref 70–99)
Glucose-Capillary: 78 mg/dL (ref 70–99)
Glucose-Capillary: 82 mg/dL (ref 70–99)

## 2022-10-13 MED ORDER — SEVELAMER CARBONATE 800 MG PO TABS
1600.0000 mg | ORAL_TABLET | Freq: Three times a day (TID) | ORAL | Status: DC
  Administered 2022-10-13 – 2022-10-14 (×3): 1600 mg via ORAL
  Filled 2022-10-13 (×3): qty 2

## 2022-10-13 MED ORDER — HEPARIN SODIUM (PORCINE) 1000 UNIT/ML IJ SOLN
INTRAMUSCULAR | Status: AC
  Filled 2022-10-13: qty 4

## 2022-10-13 NOTE — Progress Notes (Signed)
   10/13/22 1207  Pain Assessment  Pain Scale 0-10  Pain Score 0  Hemodialysis Catheter Right Subclavian Double lumen Permanent (Tunneled)  Placement Date/Time: 09/15/22 0907   Serial / Lot #: [id number]  Expiration Date: 04/26/27  Time Out: Correct patient;Correct site;Correct procedure  Maximum sterile barrier precautions: Hand hygiene;Cap;Mask;Sterile gown;Sterile gloves;Large sterile ...  Site Condition No complications  Blue Lumen Status Heparin locked  Red Lumen Status Heparin locked  Catheter fill volume (Arterial) 1.6 cc  Catheter fill volume (Venous) 1.6  Dressing Type Transparent  Dressing Status Allergy to antimicrobial disc  Drainage Description None  Post treatment catheter status Capped and Clamped  Neurological  Level of Consciousness Alert  Orientation Level Oriented X4  Respiratory  Respiratory Pattern Regular;Unlabored  Chest Assessment Chest expansion symmetrical  Bilateral Breath Sounds Clear   Received patient in bed to unit.  Alert and oriented.  Informed consent signed and in chart.   Treatment initiated: 0802a Treatment completed: 1207p  Patient tolerated well.  Transported back to the room  Alert, without acute distress.  Hand-off given to patient's nurse.   Access used: Yes Access issues: No  Total UF removed: 3000 Medication(s) given: No Meds Post HD VS: 97.4, 172/79, 16, 63, O2 97R/A Post HD weight: 78.2 kg   [facility]

## 2022-10-13 NOTE — Progress Notes (Addendum)
PROGRESS NOTE  Randy Ross  YCX:[id number] DOB: Jun 03, 1971 DOA: 08/24/2022 PCP: Medicine, [facility]   Brief Narrative: Patient is a 52 year old male with history of ESRD on dialysis, type 2 diabetes, hypertension who presented here with sudden onset of slurred speech, headache.  He was severely hypertensive on presentation.  CT head showed cerebral hemorrhage.  He was intubated in the emergency department for airway protection and admitted to PCCM/neurology service.  Self extubated on 11/30.  Currently hemodynamically stable.  PT/OT recommending Acute inpatient rehab on discharge.  Waiting for insurance Auth.  TOC following.  Prolonged hospital course.  Outpatient dialysis facility arranged.  Medically stable for discharge whenever possible to rehab  Assessment & Plan:  Principal Problem:   Acute cerebellar hemorrhage ([place]) Active Problems:   Anemia   ESRD (end stage renal disease) ([place])   Diabetes mellitus ([place])   Acute respiratory failure with hypoxia ([place])   Hypertensive emergency   Pressure injury of skin   Acute metabolic encephalopathy and delirium   Cerebral edema (H[facility])   Acute embolic stroke ([place])   Lower GI bleeding   Dysphagia   Palliative care by specialist   Acute intracerebral hemorrhage: Most likely secondary to poorly controlled hypertension.  Presented with sudden onset of mental change, left-sided weakness, slurred speech, left-sided gaze preference.  Severely hypertensive on presentation.  Was intubated and admitted to ICU and was placed on Cleviprex infusion.  CT scan showed acute intraparenchymal hematoma centered in the right cerebellar hemisphere with severe mass effect on the fourth ventricle and cerebral aqueduct.  Currently he is neurologically stable, neurology has signed off.  He should follow-up with neurology as an outpatient. PT/OT recommending acute inpatient rehab, waiting for insurance Auth.  ESRD on  dialysis/hyperkalemia: Nephrology following for dialysis.  Has a dialysis catheter on the right chest.  He will follow-up with vascular surgery as an outpatient for AV fistula creation/superficialization. Potassium of 5.5, being dialyzed today.  Dysphagia: Initially required core track, now on oral diet.  Anemia of chronic disease/Lower GI bleed: Suspected to be have lower GI bleed during his hospitalization, hemoglobin stable.  No further workup.  Acute embolic CVA: MRI of the brain on 11/21 showed many new acute infract throughout the bilateral frontal, bilateral white matter consistent with embolism.  TTE showed EF of 50 to 55%, A1c 5.9, LDL of 91.  Not on any antiplatelet therapy due to cerebral hemorrhage.  Neurology does not recommend further evaluation for cardiac source of embolism because he is not a good candidate for anticoagulation  Hypertensive emergency: Currently blood pressure up,we have added clonidine.  Continue amlodipine, Coreg, hydralazine  Aspiration pneumonia: Completed antibiotics course.  Currently on room air.  Acute metabolic encephalopathy: Most likely secondary to stroke.  Mental status is stable right now.  Remains impulsive at times.  Diabetes type 2: Well-controlled.  In addition A1c of 5.9.  Clotted  fistula: Fistulogram showed poor outflow from fistula, nephrology consulted IR for HD catheter placement on 12/6     Nutrition Problem: Inadequate oral intake Etiology: inability to eat    DVT prophylaxis:heparin injection 5,000 Units Start: 09/12/22 1445 Place and maintain sequential compression device Start: 08/25/22 1025     Code Status: Full Code  Family Communication: Girlfriend at bedside on 1/1  Patient status:Inpatient  Patient is from :Home  Anticipated discharge to:[facility]  Estimated DC date: Waiting for insurance Auth   Consultants: Neurology, PCCM  Procedures: Dialysis, intubation  Antimicrobials:  Anti-infectives (From admission,  onward)  Start     Dose/Rate Route Frequency Ordered Stop   09/14/22 1145  ceFAZolin (ANCEF) IVPB 2g/100 mL premix        2 g 200 mL/hr over 30 Minutes Intravenous To Radiology 09/14/22 1123 09/15/22 0905   08/29/22 2000  cefTRIAXone (ROCEPHIN) 2 g in sodium chloride 0.9 % 100 mL IVPB        2 g 200 mL/hr over 30 Minutes Intravenous Every 24 hours 08/29/22 1009 09/01/22 2146   08/28/22 2000  ceFEPIme (MAXIPIME) 1 g in sodium chloride 0.9 % 100 mL IVPB  Status:  Discontinued        1 g 200 mL/hr over 30 Minutes Intravenous Every 24 hours 08/27/22 1009 08/27/22 1016   08/27/22 2000  ceFEPIme (MAXIPIME) 1 g in sodium chloride 0.9 % 100 mL IVPB  Status:  Discontinued        1 g 200 mL/hr over 30 Minutes Intravenous Every 24 hours 08/27/22 1016 08/29/22 1009   08/26/22 1000  ceFEPIme (MAXIPIME) 2 g in sodium chloride 0.9 % 100 mL IVPB  Status:  Discontinued        2 g 200 mL/hr over 30 Minutes Intravenous Every 12 hours 08/26/22 0850 08/27/22 1009       Subjective:  Patient seen and examined at bedside at dialysis site.  Hemodynamically stable, comfortable, no new complaints.  Objective: Vitals:   10/13/22 0929 10/13/22 1003 10/13/22 1040 10/13/22 1100  BP: (!) 143/78 (!) 158/82 (!) 147/76 (!) 152/79  Pulse: 64 63 63 63  Resp: '15 16 16 15  '$ Temp:      TempSrc:      SpO2: 96% 98% 97% 96%  Weight:      Height:       No intake or output data in the 24 hours ending 10/13/22 1135  Filed Weights   10/10/22 0800 10/10/22 1113 10/13/22 0802  Weight: 81 kg 80 kg 81.2 kg    Examination:   General exam: Overall comfortable, not in distress HEENT: PERRL Respiratory system:  no wheezes or crackles , dialysis catheter on the right chest Cardiovascular system: S1 & S2 heard, RRR.  Gastrointestinal system: Abdomen is nondistended, soft and nontender. Central nervous system: Alert and oriented Extremities: No edema, no clubbing ,no cyanosis Skin: No rashes, no ulcers,no icterus      Data Reviewed: I have personally reviewed following labs and imaging studies  CBC: Recent Labs  Lab 10/08/22 1118 10/10/22 0755 10/13/22 0340  WBC 11.1* 12.0* 9.1  HGB 10.4* 10.7* 10.7*  HCT 32.8* 33.6* 33.5*  MCV 71.6* 71.8* 70.7*  PLT 296 316 353   Basic Metabolic Panel: Recent Labs  Lab 10/06/22 1437 10/07/22 0130 10/08/22 1118 10/10/22 0755 10/13/22 0340  NA 136 138 137 135 135  K 4.0 4.4 4.9 5.0 5.5*  CL 99 98 97* 97* 95*  CO2 '30 27 25 27 '$ 21*  GLUCOSE 255* 106* 81 84 74  BUN 31* 41* 61* 55* 70*  CREATININE 5.90* 7.47* 10.45* 10.01* 11.94*  CALCIUM 8.1* 8.7* 8.7* 8.6* 8.6*  PHOS 3.1  --  5.3* 5.9* 7.6*     No results found for this or any previous visit (from the past 240 hour(s)).   Radiology Studies: No results found.  Scheduled Meds:  amLODipine  5 mg Oral BID   carvedilol  25 mg Oral BID WC   Chlorhexidine Gluconate Cloth  6 each Topical Q0600   cloNIDine  0.1 mg Oral Q8H   [START ON 10/18/2022]  darbepoetin (ARANESP) injection - DIALYSIS  150 mcg Subcutaneous Q Mon-1800   diphenhydrAMINE-zinc acetate   Topical TID   feeding supplement (NEPRO CARB STEADY)  237 mL Oral TID WC   heparin injection (subcutaneous)  5,000 Units Subcutaneous Q8H   hydrALAZINE  100 mg Oral Q8H   insulin aspart  0-15 Units Subcutaneous Q6H   insulin glargine-yfgn  10 Units Subcutaneous Daily   multivitamin  1 tablet Oral QHS   mouth rinse  15 mL Mouth Rinse 4 times per day   pantoprazole  40 mg Oral BID   QUEtiapine  25 mg Oral QHS   sevelamer carbonate  1,600 mg Oral TID WC   Continuous Infusions:  sodium chloride Stopped (08/30/22 0033)     LOS: 63 days   Shelly Coss, MD [facility] P1/12/2022, 11:35 AM

## 2022-10-13 NOTE — Progress Notes (Signed)
SLP Cancellation Note  Patient Details Name: Randy Ross MRN: [id number] DOB: 1971/09/23   Cancelled treatment:       Reason Eval/Treat Not Completed: Patient at procedure or test/unavailable (Pt currently at HD. SLP will follow up later if schedule allows.)  Marilyn Wing I. Hardin Negus, [place], [place] Office number [phone]  [name] 10/13/2022, 9:49 AM

## 2022-10-13 NOTE — Progress Notes (Addendum)
[facility] Progress Note   Subjective:    Seen and examined patient on HD. Tolerating UFG 3L. No acute complaints.  Objective Vitals:   10/13/22 0357 10/13/22 0802 10/13/22 0810 10/13/22 0833  BP: 132/69 (!) 153/77 (!) 153/78 (!) 151/74  Pulse: 64 65 65 63  Resp: '16 16 17 17  '$ Temp: 98.4 F (36.9 C) 97.8 F (36.6 C)    TempSrc: Oral Oral    SpO2: 99% 99% 97% 98%  Weight:  81.2 kg    Height:       Physical Exam General: male in NAD Heart: RRR, no murmurs, rubs or gallops Lungs: CTA bilaterally without wheezing, rhonchi or rales Abdomen: Soft, non-distended, +BS Extremities: no edema b/l lower extremities Dialysis Access:  LUE AVF +t/b-  also [facility]   Filed Weights   10/10/22 0800 10/10/22 1113 10/13/22 0802  Weight: 81 kg 80 kg 81.2 kg    Intake/Output Summary (Last 24 hours) at 10/13/2022 0906 Last data filed at 10/12/2022 1000 Gross per 24 hour  Intake 240 ml  Output --  Net 240 ml    Additional Objective Labs: Basic Metabolic Panel: Recent Labs  Lab 10/08/22 1118 10/10/22 0755 10/13/22 0340  NA 137 135 135  K 4.9 5.0 5.5*  CL 97* 97* 95*  CO2 25 27 21*  GLUCOSE 81 84 74  BUN 61* 55* 70*  CREATININE 10.45* 10.01* 11.94*  CALCIUM 8.7* 8.6* 8.6*  PHOS 5.3* 5.9* 7.6*   Liver Function Tests: Recent Labs  Lab 10/08/22 1118 10/10/22 0755 10/13/22 0340  ALBUMIN 3.0* 3.1* 2.8*   No results for input(s): "LIPASE", "AMYLASE" in the last 168 hours. CBC: Recent Labs  Lab 10/08/22 1118 10/10/22 0755 10/13/22 0340  WBC 11.1* 12.0* 9.1  HGB 10.4* 10.7* 10.7*  HCT 32.8* 33.6* 33.5*  MCV 71.6* 71.8* 70.7*  PLT 296 316 245   Blood Culture    Component Value Date/Time   SDES TRACHEAL ASPIRATE 08/26/2022 1212   SPECREQUEST NONE 08/26/2022 1212   CULT  08/26/2022 1212    ABUNDANT STREPTOCOCCUS ANGINOSIS RARE KLEBSIELLA OXYTOCA ABUNDANT HAEMOPHILUS INFLUENZAE BETA LACTAMASE NEGATIVE Performed at [facility], [place] [address],  [place], [place] [address]    REPTSTATUS 08/29/2022 FINAL 08/26/2022 1212    Cardiac Enzymes: No results for input(s): "CKTOTAL", "CKMB", "CKMBINDEX", "TROPONINI" in the last 168 hours. CBG: Recent Labs  Lab 10/12/22 0624 10/12/22 1207 10/12/22 1710 10/12/22 2319 10/13/22 0625  GLUCAP 79 155* 144* 143* 78   Iron Studies: No results for input(s): "IRON", "TIBC", "TRANSFERRIN", "FERRITIN" in the last 72 hours. Lab Results  Component Value Date   INR 1.0 08/24/2022   Studies/Results: No results found.  Medications:  sodium chloride Stopped (08/30/22 0033)    amLODipine  5 mg Oral BID   carvedilol  25 mg Oral BID WC   Chlorhexidine Gluconate Cloth  6 each Topical Q0600   cloNIDine  0.1 mg Oral Q8H   [START ON 10/18/2022] darbepoetin (ARANESP) injection - DIALYSIS  150 mcg Subcutaneous Q Mon-1800   diphenhydrAMINE-zinc acetate   Topical TID   feeding supplement (NEPRO CARB STEADY)  237 mL Oral TID WC   heparin injection (subcutaneous)  5,000 Units Subcutaneous Q8H   hydrALAZINE  100 mg Oral Q8H   insulin aspart  0-15 Units Subcutaneous Q6H   insulin glargine-yfgn  10 Units Subcutaneous Daily   multivitamin  1 tablet Oral QHS   mouth rinse  15 mL Mouth Rinse 4 times per day  pantoprazole  40 mg Oral BID   QUEtiapine  25 mg Oral QHS   sevelamer carbonate  800 mg Oral TID WC    Dialysis Orders: SW MWF 3h 34mn  80kg   400/800   2/2 bath  Hep 2000  LFA AVF - last HD 11/13, post 80.1kg - mircera 150 q2, last 11/13 - hectorol 454m IV q HD  Assessment/Plan: 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 S/P course of hypertonic fluids.  Now doing rehab, eating and talking. Plan is for inpatient rehab AMS - due to #1. Has had significant improvement.  HTN/ vol  - Volume appears stable. He is getting po BP meds. Avoid hypotension in HD. Volume seems fine.  ESRD - pt required CRRT 11/15-11/17. Resumed IHD MWF and is tolerating well. On HD.  HD access  - BUN was very high so access recirc was suspected. IR did fistulogram 12/4 and AVF was found be small with competing veins. Using RIJ TDPortsmouthow placed by IR on 12/6. Reviewed Dr. DiScot Dockrom 1/2: patient will eventually need ligation vs new AVF. Patient wanted to proceed with procedure as an outpatient and VVS to arrange. Anemia of ESRD: Hgb 10.7, continue Aranesp 1503mweekly while here. Secondary HPTH: CCa controlled. PO4 trending up-now 7.6. Raised Renvela to 2 tabs with meals. Will add PTH at next lab draw if patient is still here. DM2 - per pmd Nutrition: Continue renal diet Disposition: [place]illed SNF in [place] renal navigator, "[facility]. Pt can start [facility] Wed "  if goes home can go back to [place]  CouTobie PoetP [employer]3/2024,9:06 AM  LOS: 50 days    Seen and examined independently.  Agree with note and exam as documented above by physician extender and as noted here.  See my procedure note from today as well.  LorClaudia DesanctisD 10/13/2022  6:55 PM

## 2022-10-13 NOTE — Progress Notes (Signed)
OT Cancellation Note  Patient Details Name: Randy Ross MRN: [id number] DOB: [birth year]   Cancelled Treatment:    Reason Eval/Treat Not Completed: Patient at procedure or test/ unavailable. Pt at HD. Will return as schedule allows.   Randy Ross, OTR/L [facility] Office: [phone]   Randy Ross 10/13/2022, 11:17 AM

## 2022-10-13 NOTE — Procedures (Signed)
Seen and examined on dialysis.  Procedure supervised.  Blood pressure 151/74 on HD and HR 64. RIJ tunn catheter.  On renal diet.  Tolerating goal.  Vascular offered AVF procedure inpatient but patient declined per charting and preferred outpatient   Randy Desanctis, MD 10/13/2022 8:51 AM

## 2022-10-13 NOTE — Progress Notes (Signed)
Occupational Therapy Treatment Patient Details Name: Randy Ross MRN: [id number] DOB: Mar 22, 1971 Today's Date: 10/13/2022   History of present illness 52 y.o. male presented 08/24/22 with slurred speech and headache. Head CT Rt cerebellar hemorrhage; intubated 11/14, self-extubated 11/30; CRRT; MRI brain 11/21 many small acute infarcts; PMH significant for end-stage renal disease on intermittent hemodialysis, diabetes, hypertension   OT comments  Pt progressing towards established OT goals and goals updated today. Pt performing toileting with set-up with wife independent assisting and NT oversight on arrival. Pt performing hand hygiene after using restroom with cues to initiate and min guard A for safety. Pt performing LB dressing with min guard A. Pt continues to experience R drift when performing functional mobility in the hallway at this time and requires up to min A for balance when not utilizing RW, thus increasing fall risk in home setting and safety during ADL. Continuing to highly recommend AIR to optimize safety and independence in ADL and IADL.    Recommendations for follow up therapy are one component of a multi-disciplinary discharge planning process, led by the attending physician.  Recommendations may be updated based on patient status, additional functional criteria and insurance authorization.    Follow Up Recommendations  Acute inpatient rehab (3hours/day)     Assistance Recommended at Discharge Frequent or constant Supervision/Assistance  Patient can return home with the following  A little help with walking and/or transfers;A little help with bathing/dressing/bathroom;Assistance with feeding;Direct supervision/assist for medications management;Direct supervision/assist for financial management;Assist for transportation;Help with stairs or ramp for entrance   Equipment Recommendations  Other (comment) (TBD next venue)    Recommendations for Other Services Rehab  consult    Precautions / Restrictions Precautions Precautions: Fall Restrictions Weight Bearing Restrictions: No       Mobility Bed Mobility Overal bed mobility: Needs Assistance Bed Mobility: Sit to Supine       Sit to supine: Supervision   General bed mobility comments: for safety    Transfers Overall transfer level: Needs assistance Equipment used: None Transfers: Sit to/from Stand Sit to Stand: Min guard           General transfer comment: min guard for safety without use of RW     Balance Overall balance assessment: Needs assistance Sitting-balance support: No upper extremity supported, Feet unsupported Sitting balance-Leahy Scale: Fair     Standing balance support: During functional activity, No upper extremity supported Standing balance-Leahy Scale: Fair Standing balance comment: able to stand statically without assist                           ADL either performed or assessed with clinical judgement   ADL Overall ADL's : Needs assistance/impaired     Grooming: Wash/dry hands;Min guard;Standing Grooming Details (indicate cue type and reason): Min gaurd A for safety; cues to initiate after restroom use             Lower Body Dressing: Min guard Lower Body Dressing Details (indicate cue type and reason): To don pants sitting EOB. Pt with skill to tie drawstring Toilet Transfer: Min guard;Minimal assistance;Rolling walker (2 wheels);Cueing for safety Toilet Transfer Details (indicate cue type and reason): In restroom on arrival. NT present, but wife indepednent assisting. Pt slightly impulsive and wife cueing pt to wait and slow down both in the restroom and prior to functional mobility. Toileting- Clothing Manipulation and Hygiene: Supervision/safety;Sitting/lateral lean Toileting - Clothing Manipulation Details (indicate cue type and reason): wife in restroom  with pt providing set-up for pericare     Functional mobility during ADLs:  Min guard;Minimal assistance;Cueing for sequencing;Cueing for safety;Rolling walker (2 wheels) General ADL Comments: Min A for functional mobility with or without RW> Pt impulsive and requires up to mod cues to follow directions rather than impulsively executing fuctional mobility    Extremity/Trunk Assessment              Vision   Additional Comments: decreased attention to R   Perception     Praxis      Cognition Arousal/Alertness: Awake/alert Behavior During Therapy: Impulsive Overall Cognitive Status: Difficult to assess Area of Impairment: Attention, Following commands, Safety/judgement, Awareness, Problem solving                   Current Attention Level: Sustained   Following Commands: Follows one step commands with increased time Safety/Judgement: Decreased awareness of safety, Decreased awareness of deficits Awareness: Intellectual Problem Solving: Slow processing, Difficulty sequencing, Requires verbal cues General Comments: pt either doesn't answer questions or is difficult to understand, therefore difficult to assess/challenge cognition. Patient able to report fatigue and need to return to room, and observed to appologize to OT as requesting come back later due to sleeping when first arriving. Pt following all one step commands with increased time and up to min cues.        Exercises      Shoulder Instructions       General Comments wife present and indep assist in restroom    Pertinent Vitals/ Pain       Pain Assessment Pain Assessment: Faces Faces Pain Scale: Hurts a little bit Pain Descriptors / Indicators: Headache Pain Intervention(s): Limited activity within patient's tolerance, Monitored during session, Patient requesting pain meds-RN notified  Home Living                                          Prior Functioning/Environment              Frequency  Min 2X/week        Progress Toward Goals  OT  Goals(current goals can now be found in the care plan section)  Progress towards OT goals: Progressing toward goals  Acute Rehab OT Goals OT Goal Formulation: With patient Time For Goal Achievement: 10/27/22 Potential to Achieve Goals: Fair ADL Goals Pt Will Perform Lower Body Dressing: with supervision Pt Will Transfer to Toilet: ambulating;with supervision Additional ADL Goal #1: pt will follow two step commands during ADL with 1-2 cues  Plan Discharge plan remains appropriate;Frequency remains appropriate    Co-evaluation                 AM-PAC OT "6 Clicks" Daily Activity     Outcome Measure   Help from another person eating meals?: A Little Help from another person taking care of personal grooming?: A Little Help from another person toileting, which includes using toliet, bedpan, or urinal?: A Lot Help from another person bathing (including washing, rinsing, drying)?: A Lot Help from another person to put on and taking off regular upper body clothing?: A Little Help from another person to put on and taking off regular lower body clothing?: A Little 6 Click Score: 16    End of Session Equipment Utilized During Treatment: Gait belt;Rolling walker (2 wheels)  OT Visit Diagnosis: Unsteadiness on feet (R26.81);Muscle weakness (generalized) (M62.81);Other abnormalities of  gait and mobility (R26.89);Other symptoms and signs involving cognitive function;Cognitive communication deficit (R41.841) Symptoms and signs involving cognitive functions: Nontraumatic SAH   Activity Tolerance Patient tolerated treatment well   Patient Left in bed;with call bell/phone within reach;with bed alarm set;with family/visitor present   Nurse Communication Mobility status        Time: 1500-1520 OT Time Calculation (min): 20 min  Charges: OT General Charges $OT Visit: 1 Visit OT Treatments $Self Care/Home Management : 8-22 mins  Elder Cyphers, OTR/L [facility] Acute  Rehabilitation Office: [phone]   Magnus Ivan 10/13/2022, 5:37 PM

## 2022-10-13 NOTE — Progress Notes (Signed)
Mobility Specialist: Progress Note   10/13/22 1719  Mobility  Activity Ambulated with assistance in hallway  Level of Assistance Minimal assist, patient does 75% or more  Assistive Device None  Distance Ambulated (ft) 350 ft  Activity Response Tolerated well  Mobility Referral Yes  $Mobility charge 1 Mobility   Pt received in the BR and agreeable to mobility once finished. Pt ambulated w/o AD today requiring minA for balance throughout as pt has tendency to veer Rt. LOB x1 to the Lt d/t cross-over gait requiring minA to correct as well. Pt to bed after session with call bell in reach and family present in the room.   [place] Randy Ross Mobility Specialist Please contact via SecureChat or Rehab office at [phone]

## 2022-10-14 LAB — BASIC METABOLIC PANEL
Anion gap: 12 (ref 5–15)
BUN: 35 mg/dL — ABNORMAL HIGH (ref 6–20)
CO2: 27 mmol/L (ref 22–32)
Calcium: 8.4 mg/dL — ABNORMAL LOW (ref 8.9–10.3)
Chloride: 97 mmol/L — ABNORMAL LOW (ref 98–111)
Creatinine, Ser: 7.85 mg/dL — ABNORMAL HIGH (ref 0.61–1.24)
GFR, Estimated: 8 mL/min — ABNORMAL LOW (ref >60)
Glucose, Bld: 104 mg/dL — ABNORMAL HIGH (ref 70–99)
Potassium: 4.9 mmol/L (ref 3.5–5.1)
Sodium: 136 mmol/L (ref 135–145)

## 2022-10-14 LAB — GLUCOSE, CAPILLARY
Glucose-Capillary: 117 mg/dL — ABNORMAL HIGH (ref 70–99)
Glucose-Capillary: 134 mg/dL — ABNORMAL HIGH (ref 70–99)

## 2022-10-14 LAB — PARATHYROID HORMONE, INTACT (NO CA): PTH: 85 pg/mL — ABNORMAL HIGH (ref 15–65)

## 2022-10-14 MED ORDER — CLONIDINE HCL 0.1 MG PO TABS: 0.1000 mg | ORAL_TABLET | Freq: Three times a day (TID) | ORAL | 11 refills | Status: DC

## 2022-10-14 MED ORDER — CARVEDILOL 25 MG PO TABS: 25.0000 mg | ORAL_TABLET | Freq: Two times a day (BID) | ORAL | Status: DC

## 2022-10-14 MED ORDER — DIPHENHYDRAMINE-ZINC ACETATE 2-0.1 % EX CREA: TOPICAL_CREAM | Freq: Three times a day (TID) | CUTANEOUS | 0 refills | Status: DC

## 2022-10-14 MED ORDER — BENZONATATE 100 MG PO CAPS: 100.0000 mg | ORAL_CAPSULE | Freq: Two times a day (BID) | ORAL | Status: DC | PRN

## 2022-10-14 MED ORDER — BENZONATATE 100 MG PO CAPS: 100.0000 mg | ORAL_CAPSULE | Freq: Two times a day (BID) | ORAL | 0 refills | Status: DC | PRN

## 2022-10-14 MED ORDER — SEVELAMER CARBONATE 800 MG PO TABS: 1600.0000 mg | ORAL_TABLET | Freq: Three times a day (TID) | ORAL | Status: DC

## 2022-10-14 NOTE — TOC Transition Note (Signed)
Transition of Care [facility]) - CM/SW Discharge Note   Patient Details  Name: Randy Ross MRN: [id number] Date of Birth: Jan 01, 1971  [facility] CM/SW Contact:  Pollie Friar, RN Phone Number: 10/14/2022, 12:09 PM   Clinical Narrative:    Pt is discharging to [facility] today. Ambetter has approved rehab. CM has updated the patient and his 2 sisters.  Bedside RN aware. D/c packet is at the desk.   Number for report: [phone]   Final next level of care: IP Rehab Facility Barriers to Discharge: No Barriers Identified   Patient Goals and CMS Choice CMS Medicare.gov Compare Post Acute Care list provided to:: Patient Choice offered to / list presented to : Patient, Sibling  Discharge Placement                         Discharge Plan and Services Additional resources added to the After Visit Summary for                                       Social Determinants of Health (SDOH) Interventions SDOH Screenings   Tobacco Use: High Risk (09/15/2022)     Readmission Risk Interventions     No data to display

## 2022-10-14 NOTE — Progress Notes (Signed)
Gave report to [facility] at [facility].  Irven [place], RN

## 2022-10-14 NOTE — Progress Notes (Addendum)
[facility] Progress Note   Subjective: Completed dialysis yesterday -net UF 3L  Seen in room. C/o cough -wants something for cough. No cp, dyspnea.  Plans for discharge today.   Objective Vitals:   10/14/22 0001 10/14/22 0326 10/14/22 0637 10/14/22 0801  BP: (!) 158/78 (!) 160/78 (!) 160/84 137/72  Pulse:  74 77 69  Resp:  '16 16 18  '$ Temp:  98 F (36.7 C)  98 F (36.7 C)  TempSrc:  Oral    SpO2:  97%  96%  Weight:      Height:         Additional Objective Labs: Basic Metabolic Panel: Recent Labs  Lab 10/08/22 1118 10/10/22 0755 10/13/22 0340 10/14/22 0303  NA 137 135 135 136  K 4.9 5.0 5.5* 4.9  CL 97* 97* 95* 97*  CO2 25 27 21* 27  GLUCOSE 81 84 74 104*  BUN 61* 55* 70* 35*  CREATININE 10.45* 10.01* 11.94* 7.85*  CALCIUM 8.7* 8.6* 8.6* 8.4*  PHOS 5.3* 5.9* 7.6*  --    CBC: Recent Labs  Lab 10/08/22 1118 10/10/22 0755 10/13/22 0340  WBC 11.1* 12.0* 9.1  HGB 10.4* 10.7* 10.7*  HCT 32.8* 33.6* 33.5*  MCV 71.6* 71.8* 70.7*  PLT 296 316 245   Blood Culture    Component Value Date/Time   SDES TRACHEAL ASPIRATE 08/26/2022 1212   SPECREQUEST NONE 08/26/2022 1212   CULT  08/26/2022 1212    ABUNDANT STREPTOCOCCUS ANGINOSIS RARE KLEBSIELLA OXYTOCA ABUNDANT HAEMOPHILUS INFLUENZAE BETA LACTAMASE NEGATIVE Performed at [facility], [place] [address] [place], [place] [address]    REPTSTATUS 08/29/2022 FINAL 08/26/2022 1212     Physical Exam General: Well appearing, nad Heart: RRR Lungs: Clear bilaterally  Abdomen: soft non-tender Extremities: No LE edema  Dialysis Access: R TDC   Medications:  sodium chloride Stopped (08/30/22 0033)    amLODipine  5 mg Oral BID   carvedilol  25 mg Oral BID WC   Chlorhexidine Gluconate Cloth  6 each Topical Q0600   cloNIDine  0.1 mg Oral Q8H   [START ON 10/18/2022] darbepoetin (ARANESP) injection - DIALYSIS  150 mcg Subcutaneous Q Mon-1800   diphenhydrAMINE-zinc acetate   Topical TID   feeding  supplement (NEPRO CARB STEADY)  237 mL Oral TID WC   heparin injection (subcutaneous)  5,000 Units Subcutaneous Q8H   hydrALAZINE  100 mg Oral Q8H   insulin aspart  0-15 Units Subcutaneous Q6H   insulin glargine-yfgn  10 Units Subcutaneous Daily   multivitamin  1 tablet Oral QHS   mouth rinse  15 mL Mouth Rinse 4 times per day   pantoprazole  40 mg Oral BID   QUEtiapine  25 mg Oral QHS   sevelamer carbonate  1,600 mg Oral TID WC    Dialysis Orders:  SW MWF 3h 85mn  80kg   400/800   2/2 bath  Hep 2000  LFA AVF - last HD 11/13, post 80.1kg - mircera 150 q2, last 11/13 - hectorol 424m IV q HD  Assessment/Plan: 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 S/P course of hypertonic fluids.  Now doing rehab, eating and talking. Plan is for inpatient rehab HTN/ vol  - BP/volume acceptable. Avoid hypotension on HD.  ESRD - pt required CRRT 11/15-11/17. Resumed IHD MWF and is tolerating well. Next HD 1/5 HD access - BUN was very high so access recirc was suspected. IR did fistulogram 12/4 and AVF was found be small  with competing veins. Using RIJ Steele now placed by IR on 12/6.  Will eventually need ligation vs new AVF. Patient wanted to proceed with procedure as an outpatient and [facility] to arrange. 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 Secondary HPTH: CCa controlled. PO4 trending up-now 7.6. Raised Renvela to 2 tabs with meals. DM2 - per pmd Nutrition: Continue renal diet   Dispo: Discharge today. [facility].   OLynnda ChildPA-C [facility] 10/14/2022,11:01 AM    Seen by physician extender.  Agree with note and plans as documented above by physician extender and as noted here.  LClaudia Desanctis MD 10/14/2022  6:07 PM

## 2022-10-14 NOTE — Progress Notes (Signed)
Advised by RN CM this morning that pt will d/c to [facility] rehab today. Contacted Shana at [facility] this morning to advise clinic that pt will tx to rehab today and will need to start at clinic tomorrow. Edwena Blow confirms pt can start tomorrow. Will fax d/c summary and last renal note to clinic for continuation of care. HD appts added to AVS as well. Update provided to nephrologist.   [name] Renal Navigator [phone]

## 2022-10-14 NOTE — Discharge Summary (Addendum)
Physician Discharge Summary   Patient: Randy Ross MRN: [id number] DOB: 06/08/1971  Admit date:     08/24/2022  Discharge date: 10/14/22  Discharge Physician: Estill Cotta, MD    PCP: Medicine, [facility]   Recommendations at discharge:   Outpatient dialysis arranged, next dialysis on 1/5. Will eventually need ligation versus new AV fistula, patient wants to proceed with the procedure as an outpatient and vascular surgery to arrange. Outpatient dialysis chair at [facility] dialysis on Monday Wednesday Friday schedule  Discharge Diagnoses:    Acute cerebellar hemorrhage (HCC)   Anemia of chronic disease/lower GI bleed   ESRD (end stage renal disease) ([place]), started on hemodialysis   Diabetes mellitus ([place])   Acute respiratory failure with hypoxia (HCC)   Hypertensive emergency   Pressure injury of skin   Acute metabolic encephalopathy and delirium Aspiration pneumonia   Lower GI bleeding   Dysphagia Acute embolic CVA Dysphagia    Hospital Course:  Patient is a 52 year old male with history of ESRD on dialysis, type 2 diabetes, hypertension who presented here with sudden onset of slurred speech, headache.  He was severely hypertensive on presentation.  CT head showed cerebral hemorrhage.  He was intubated in the emergency department for airway protection and admitted to PCCM/neurology service.  Self extubated on 11/30.  Currently hemodynamically stable.  PT/OT recommended acute inpatient rehab.   Outpatient dialysis facility arranged.     Assessment and Plan:  Acute intracerebral hemorrhage with cerebral edema: -Likely secondary to poorly controlled hypertension, hypertensive emergency. -Presented with sudden onset of mental status change, left-sided weakness, slurred speech, left-sided gaze preference with severely hypertensive on presentation. -He was intubated and admitted to ICU and was placed on Cleviprex infusion. -CT head showed acute  intraparenchymal hematoma centered in the right cerebellar hemisphere with severe mass effect of the fourth ventricle and cerebral aqueduct. -Currently stable, neurology has signed off and will follow-up with neurology as an outpatient -PT recommended acute inpatient rehab. -Needs good BP control  ESRD on HD, hyperkalemia -Initially required CRRT 11/15 to 11/17, resumed HD MWF -Next hemodialysis on 1/5.  He has outpatient dialysis chair at [facility] to dialysis on MWF schedule -IR did fistulogram 12/4 and aVF was found to be small with competing veins.   -Has right IJ TDC, placed by IR on 12/6, will follow-up with vascular surgery as outpatient for AV fistula creation -Continue renal diet, Renvela, ESA per nephrology     Dysphagia: - Initially required core track, now on oral diet.   Anemia of chronic disease/Lower GI bleed:  -Suspected to be have lower GI bleed during his hospitalization, hemoglobin stable.  No further workup. -Hemoglobin 8.7 upon discharge.   Acute embolic CVA:  -MRI of the brain on 11/21 showed many new acute infract throughout the bilateral frontal, bilateral white matter consistent with embolism.  -2D echo showed EF of 50 to 55% - A1c 5.9, LDL 91.   - Not on any antiplatelet therapy due to cerebral hemorrhage.  Neurology does not recommend further evaluation for cardiac source of embolism because he is not a good candidate for anticoagulation   Hypertensive emergency:  -Continue amlodipine, Coreg, hydralazine, clonidine added for better BP control   Aspiration pneumonia:  - Completed antibiotics course.  Currently on room air.   Acute metabolic encephalopathy:  -Most likely due to #1, currently improving. Remains impulsive at times.  Diabetes mellitus type 2, controlled, with complication of ESRD -N0U 5.9   Clotted  fistula: Fistulogram showed  poor outflow from fistula, nephrology consulted IR for HD catheter placement on 12/6          Pain control -  [place] was reviewed. and patient was instructed, not to drive, operate heavy machinery, perform activities at heights, swimming or participation in water activities or provide baby-sitting services while on Pain, Sleep and Anxiety Medications; until their outpatient Physician has advised to do so again. Also recommended to not to take more than prescribed Pain, Sleep and Anxiety Medications.  Consultants: CCM, nephrology, vascular surgery, neurology, neurosurgery Procedures performed: Hemodialysis, duration Disposition: Rehabilitation facility Diet recommendation:  Renal diet DISCHARGE MEDICATION: Allergies as of 10/14/2022       Reactions   Gabapentin Shortness Of Breath, Rash   Scratchy throat   Metformin And Related Other (See Comments)   Affecting kidney function        Medication List     STOP taking these medications    calcium acetate 667 MG capsule Commonly known as: PHOSLO   isosorbide mononitrate 30 MG 24 hr tablet Commonly known as: IMDUR   losartan 100 MG tablet Commonly known as: COZAAR   PRESCRIPTION MEDICATION       TAKE these medications    acetaminophen 325 MG tablet Commonly known as: Tylenol Take 2 tablets (650 mg total) by mouth every 6 (six) hours as needed for moderate pain. What changed:  when to take this reasons to take this   amLODipine 10 MG tablet Commonly known as: NORVASC Take 10 mg by mouth at bedtime.   benzonatate 100 MG capsule Commonly known as: TESSALON Take 1 capsule (100 mg total) by mouth 2 (two) times daily as needed for cough.   calcitRIOL 0.25 MCG capsule Commonly known as: ROCALTROL Take 1 capsule (0.25 mcg total) by mouth daily.   carvedilol 25 MG tablet Commonly known as: COREG Take 1 tablet (25 mg total) by mouth 2 (two) times daily. What changed: when to take this   cetaphil cream Apply 1 application  topically daily as needed (itching).   cloNIDine 0.1  MG tablet Commonly known as: CATAPRES Take 1 tablet (0.1 mg total) by mouth 3 (three) times daily.   diphenhydrAMINE-zinc acetate cream Commonly known as: BENADRYL Apply topically 3 (three) times daily. Apply to itchy areas on the legs   hydrALAZINE 100 MG tablet Commonly known as: APRESOLINE Take 1 tablet (100 mg total) by mouth every 8 (eight) hours.   multivitamin Tabs tablet Take 1 tablet by mouth at bedtime.   QUEtiapine 25 MG tablet Commonly known as: SEROQUEL Take 1 tablet (25 mg total) by mouth at bedtime.   Semglee (yfgn) 100 UNIT/ML Pen Generic drug: insulin glargine-yfgn Inject 10 Units into the skin daily.   sevelamer carbonate 800 MG tablet Commonly known as: RENVELA Take 2 tablets (1,600 mg total) by mouth 3 (three) times daily with meals.   tetrahydrozoline-zinc 0.05-0.25 % ophthalmic solution Commonly known as: VISINE-AC Place 1 drop into both eyes 3 (three) times daily as needed (dry eyes).               Discharge Care Instructions  (From admission, onward)           Start     Ordered   10/14/22 0000  If the dressing is still on your incision site when you go home, remove it on the third day after your surgery date. Remove dressing if it begins to fall off, or if it is dirty or  damaged before the third day.        10/14/22 1135            Contact information for follow-up providers     [facility]. Go on 10/15/2022.   Why: Schedule is Monday/Wednesday/Friday.  Patient can start on Friday. Please arrive at 11:05 am for 11:20 am chair time. Contact information: [address] [place], [place] [address] [phone]        Medicine, [facility]. Schedule an appointment as soon as possible for a visit in 2 week(s).   Specialty: Family Medicine Why: for hospital follow-up        [facility]. Schedule an appointment as soon as possible for a  visit in 4 week(s).   Specialty: Neurology Why: for hospital follow-up Contact information: [address] [place] [phone]             Contact information for after-discharge care     [place] .   Service: Inpatient Rehabilitation Contact information: [place]. [place] [phone]                    Discharge Exam: Filed Weights   10/10/22 1113 10/13/22 0802 10/13/22 1207  Weight: 80 kg 81.2 kg 78.2 kg   S: No acute complaints, discharging to rehab facility.  Underwent HD on 1/3.  Family member at the bedside  BP (!) 152/65 (BP Location: Right Arm)   Pulse 77   Temp 98.4 F (36.9 C)   Resp 18   Ht '5\' 5"'$  (1.651 m)   Wt 78.2 kg   SpO2 96%   BMI 28.69 kg/m   Physical Exam General: Alert and awake, NAD, Cardiovascular: S1 S2 clear, RRR.  Respiratory: CTAB, no wheezing, right IJ TDC Gastrointestinal: Soft, nontender, nondistended, NBS Ext: no pedal edema bilaterally Neuro: no new deficits   Condition at discharge: fair  The results of significant diagnostics from this hospitalization (including imaging, microbiology, ancillary and laboratory) are listed below for reference.   Imaging Studies: DG Swallowing Func-Speech Pathology  Result Date: 09/22/2022 Table formatting from the original result was not included. Objective Swallowing Evaluation: Type of Study: MBS-Modified Barium Swallow Study  Patient Details Name: Randy Ross MRN: [id number] Date of Birth: 1971-01-01 Today's Date: 09/22/2022 Time: SLP Start Time (ACUTE ONLY): 1210 -SLP Stop Time (ACUTE ONLY): 1240 SLP Time Calculation (min) (ACUTE ONLY): 30 min Past Medical History: Past Medical History: Diagnosis Date  Anemia   CKD (chronic kidney disease)   on wed 12/09/21  Diabetes mellitus without complication ([place])   type 2  Hypertension   Renal disorder  Past  Surgical History: Past Surgical History: Procedure Laterality Date  AV FISTULA PLACEMENT Left 12/10/2021  Procedure: LEFT RADIAL CEPHALIC  ARTERIOVENOUS (AV) FISTULA;  Surgeon: Waynetta Sandy, MD;  Location: [facility];  Service: Vascular;  Laterality: Left;  IR DIALY SHUNT INTRO [place] W/IMG LEFT Left 09/13/2022  IR FLUORO GUIDE CV LINE RIGHT  12/09/2021  IR FLUORO GUIDE CV LINE RIGHT  09/15/2022  IR US GUIDE VASC ACCESS RIGHT  12/09/2021  IR US GUIDE VASC ACCESS RIGHT  09/15/2022  LUMBAR [place] SURGERY  2021  L5-S1 HPI: Pt is a 52 y.o. male who presented with sudden onset slurred speech, chest pain, left sided weakness. Surrounding edema in the face of the fourth ventricle without hydrocephalus.  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 ETT 11/14-11/30 (self-extubated). Palliative meeting 12/1: full scope. PMH: end-stage renal disease on intermittent hemodialysis, diabetes, hypertension.  No data recorded  Recommendations for follow up therapy are one component of a multi-disciplinary discharge planning process, led by the attending physician.  Recommendations may be updated based on patient status, additional functional criteria and insurance authorization. Assessment / Plan / Recommendation   09/22/2022   2:38 PM Clinical Impressions Clinical Impression The study was suboptimal since pt's frequent movement facilitated the some swallows being missed. Pt presents with oropharyngeal dysphagia characterized by weak lingual manipulation, reduced lingual retraction, reduced bolus cohesion, impaired posterior propulsion and a pharyngeal delay. He demonstrated lingual pumping, oral residue, vallecular residue, and significant oral holding which, considering his performance on 12/12, SLP suspects could be at least partly attributed to the barium being less palatable. Penetration (PAS 5) and aspiration (PAS 7) were noted with thin and nectar thick liquids secondary  to the pharyngeal delay. Aspiration of nectar thick liquids was eliminated with a single bolus of nectar thick liquids, but this could not be replicated due to pt's unwillingness to swallow subsequent nectar thick boluses. All instances of aspiration were sensed, but coughing was ineffective in expelling aspirated material. A dysphagia 1 diet with honey thick liquids will be initiated at this time; considering pt's consistently good laryngeal sensation, SLP anticipates that a repeat instrumental assessment will not be needed for diet advancement. Pt's performance and SLP recommendations were discussed with Dr. Alfredia Ferguson since a G-tube was being considered on 12/12. It was agreed that SLP will continue dysphagia treatment, and that adequacy of p.o. intake will be assessed with possible impelementation of a calorie count. SLP Visit Diagnosis Dysphagia, oropharyngeal phase (R13.12) Impact on safety and function Mild aspiration risk;Moderate aspiration risk     09/22/2022   2:38 PM Treatment Recommendations Treatment Recommendations Therapy as outlined in treatment plan below     09/22/2022   2:38 PM Prognosis Prognosis for Safe Diet Advancement Good Barriers to Reach Goals Cognitive deficits   09/22/2022   2:38 PM Diet Recommendations SLP Diet Recommendations Dysphagia 1 (Puree) solids;Honey thick liquids Liquid Administration via Straw;Cup Medication Administration Crushed with puree Compensations Small sips/bites;Slow rate Postural Changes Seated upright at 90 degrees     09/22/2022   2:38 PM Other Recommendations Oral Care Recommendations Oral care BID Follow Up Recommendations Skilled nursing-short term rehab (<3 hours/day) Functional Status Assessment Patient has had a recent decline in their functional status and demonstrates the ability to make significant improvements in function in a reasonable and predictable amount of time.   09/22/2022   2:38 PM Frequency and Duration  Speech Therapy Frequency (ACUTE ONLY) min  2x/week Treatment Duration 2 weeks     09/22/2022   2:38 PM Oral Phase Oral Phase Impaired Oral - Honey Cup Lingual pumping;Weak lingual manipulation;Reduced posterior propulsion;Holding of bolus;Decreased bolus cohesion;Delayed oral transit Oral - Nectar Cup Lingual pumping;Weak lingual manipulation;Reduced posterior propulsion;Holding of bolus;Decreased bolus cohesion;Delayed oral transit Oral - Nectar Straw Lingual pumping;Weak lingual manipulation;Reduced posterior propulsion;Holding of bolus;Decreased bolus cohesion;Delayed oral transit Oral - Thin Cup Lingual pumping;Weak lingual manipulation;Reduced posterior propulsion;Holding of bolus;Decreased bolus cohesion;Delayed oral transit Oral - Thin Straw Lingual pumping;Weak lingual manipulation;Reduced posterior propulsion;Holding of bolus;Decreased bolus cohesion;Delayed oral transit Oral - Puree Lingual pumping;Weak lingual manipulation;Reduced posterior propulsion;Holding of bolus;Decreased bolus cohesion;Delayed oral transit    09/22/2022   2:38 PM Pharyngeal Phase Pharyngeal Phase Impaired Pharyngeal- Honey Cup Reduced tongue base retraction;Pharyngeal  residue - valleculae;Delayed swallow initiation-vallecula Pharyngeal- Nectar Cup Reduced tongue base retraction;Pharyngeal residue - valleculae;Delayed swallow initiation-pyriform sinuses;Delayed swallow initiation-vallecula;Penetration/Aspiration before swallow;Penetration/Aspiration during swallow Pharyngeal Material enters airway, CONTACTS cords and not ejected out;Material enters airway, passes BELOW cords and not ejected out despite cough attempt by patient Pharyngeal- Nectar Straw Reduced tongue base retraction;Pharyngeal residue - valleculae;Delayed swallow initiation-pyriform sinuses;Delayed swallow initiation-vallecula;Penetration/Aspiration before swallow;Penetration/Aspiration during swallow Pharyngeal Material enters airway, passes BELOW cords and not ejected out despite cough attempt by patient  Pharyngeal- Thin Cup Reduced tongue base retraction;Pharyngeal residue - valleculae;Delayed swallow initiation-pyriform sinuses;Delayed swallow initiation-vallecula;Penetration/Aspiration before swallow;Penetration/Aspiration during swallow Pharyngeal Material enters airway, passes BELOW cords and not ejected out despite cough attempt by patient Pharyngeal- Thin Straw Reduced tongue base retraction;Pharyngeal residue - valleculae;Delayed swallow initiation-pyriform sinuses;Delayed swallow initiation-vallecula;Penetration/Aspiration before swallow;Penetration/Aspiration during swallow Pharyngeal Material enters airway, passes BELOW cords and not ejected out despite cough attempt by patient Pharyngeal- Puree Delayed swallow initiation-pyriform sinuses;Delayed swallow initiation-vallecula    09/22/2022   2:38 PM Cervical Esophageal Phase  Cervical Esophageal Phase Impaired Thin Cup Esophageal backflow into the pharynx Thin Straw Esophageal backflow into the pharynx Shanika I. Hardin Negus, [place], [place] Office number [phone] [name] 09/22/2022, 3:00 PM                     CT ABDOMEN WO CONTRAST  Result Date: 09/21/2022 CLINICAL DATA:  Evaluate for percutaneous gastrostomy tube placement EXAM: CT ABDOMEN WITHOUT CONTRAST TECHNIQUE: Multidetector CT imaging of the abdomen was performed following the standard protocol without IV contrast. RADIATION DOSE REDUCTION: This exam was performed according to the departmental dose-optimization program which includes automated exposure control, adjustment of the mA and/or kV according to patient size and/or use of iterative reconstruction technique. COMPARISON:  None Available. FINDINGS: Lower chest: Mild atelectasis in the RIGHT lower lobe RIGHT middle lobe. No pleural fluid small focus of airspace disease in the most inferior RIGHT lower lobe (image 20/5) Hepatobiliary: No focal hepatic lesion on noncontrast exam. Gallbladder normal.  Pancreas: Normal pancreatic parenchymal intensity. No ductal dilatation or inflammation. Spleen: Normal spleen. Adrenals/urinary tract: Adrenal glands limited view of the kidney is unremarkable. Stomach/Bowel: Feeding tube extends into the stomach with tip in the duodenal bulb. Stomach in normal orientation with the gastric antrum approaching the ventral abdominal wall. Vascular/Lymphatic: Abdominal aortic normal caliber. No retroperitoneal periportal lymphadenopathy. Musculoskeletal: No aggressive osseous lesion IMPRESSION: 1. Stomach in typical anatomic position. 2. Feeding tube with tip in the first portion duodenum. 3. Very small focus of infection or pneumonitis at the RIGHT lung base. Electronically Signed   By: Suzy Bouchard M.D.   On: 09/21/2022 09:07   DG Abd Portable 1V  Result Date: 09/15/2022 CLINICAL DATA:  Feeding tube placement EXAM: PORTABLE ABDOMEN - 1 VIEW COMPARISON:  09/14/2022 FINDINGS: Non weighted enteric feeding tube is positioned with tip below the diaphragm in the vicinity of the pylorus or duodenal bulb. Nonobstructive pattern of included bowel gas. No free air. IMPRESSION: Non weighted enteric feeding tube is positioned with tip below the diaphragm in the vicinity of the pylorus or duodenal bulb. Electronically Signed   By: Delanna Ahmadi M.D.   On: 09/15/2022 12:10   IR Fluoro Guide CV Line Right  Result Date: 09/15/2022 INDICATION: Access for long-term dialysis EXAM: ULTRASOUND GUIDANCE FOR VASCULAR ACCESS RIGHT INTERNAL JUGULAR PERMANENT HEMODIALYSIS CATHETER Date:  09/15/2022 09/15/2022 9:58 am Radiologist:  Jerilynn Mages. Daryll Brod, MD Guidance:  Ultrasound and fluoroscopic FLUOROSCOPY: Fluoroscopy Time: (9.0 mGy). MEDICATIONS: Ancef 2  g within 1 hour of the procedure ANESTHESIA/SEDATION: Versed 0.5 mg IV; Fentanyl 25 mcg IV; Moderate Sedation Time:  18 minute The patient was continuously monitored during the procedure by the interventional radiology nurse under my direct supervision.  CONTRAST:  None. COMPLICATIONS: None immediate. PROCEDURE: Informed consent was obtained from the patient following explanation of the procedure, risks, benefits and alternatives. The patient understands, agrees and consents for the procedure. All questions were addressed. A time out was performed. Maximal barrier sterile technique utilized including caps, mask, sterile gowns, sterile gloves, large sterile drape, hand hygiene, and 2% chlorhexidine scrub. Under sterile conditions and local anesthesia, right internal jugular micropuncture venous access was performed with ultrasound. Images were obtained for documentation of the patent right internal jugular vein. A guide wire was inserted followed by a transitional dilator. Next, a 0.035 guidewire was advanced into the IVC with a 5-French catheter. Measurements were obtained from the right venotomy site to the proximal right atrium. In the right infraclavicular chest, a subcutaneous tunnel was created under sterile conditions and local anesthesia. 1% lidocaine with epinephrine was utilized for this. The 19 cm tip to cuff palindrome catheter was tunneled subcutaneously to the venotomy site and inserted into the SVC/RA junction through a valved peel-away sheath. Position was confirmed with fluoroscopy. Images were obtained for documentation. Blood was aspirated from the catheter followed by saline and heparin flushes. The appropriate volume and strength of heparin was instilled in each lumen. Caps were applied. The catheter was secured at the tunnel site with Gelfoam and a pursestring suture. The venotomy site was closed with subcuticular Vicryl suture. Dermabond was applied to the small right neck incision. A dry sterile dressing was applied. The catheter is ready for use. No immediate complications. IMPRESSION: Ultrasound and fluoroscopically guided right internal jugular tunneled hemodialysis catheter (19 cm tip to cuff palindrome catheter). Electronically Signed    By: Jerilynn Mages.  Shick M.D.   On: 09/15/2022 11:11   IR US Guide Vasc Access Right  Result Date: 09/15/2022 INDICATION: Access for long-term dialysis EXAM: ULTRASOUND GUIDANCE FOR VASCULAR ACCESS RIGHT INTERNAL JUGULAR PERMANENT HEMODIALYSIS CATHETER Date:  09/15/2022 09/15/2022 9:58 am Radiologist:  Jerilynn Mages. Daryll Brod, MD Guidance:  Ultrasound and fluoroscopic FLUOROSCOPY: Fluoroscopy Time: (9.0 mGy). MEDICATIONS: Ancef 2 g within 1 hour of the procedure ANESTHESIA/SEDATION: Versed 0.5 mg IV; Fentanyl 25 mcg IV; Moderate Sedation Time:  18 minute The patient was continuously monitored during the procedure by the interventional radiology nurse under my direct supervision. CONTRAST:  None. COMPLICATIONS: None immediate. PROCEDURE: Informed consent was obtained from the patient following explanation of the procedure, risks, benefits and alternatives. The patient understands, agrees and consents for the procedure. All questions were addressed. A time out was performed. Maximal barrier sterile technique utilized including caps, mask, sterile gowns, sterile gloves, large sterile drape, hand hygiene, and 2% chlorhexidine scrub. Under sterile conditions and local anesthesia, right internal jugular micropuncture venous access was performed with ultrasound. Images were obtained for documentation of the patent right internal jugular vein. A guide wire was inserted followed by a transitional dilator. Next, a 0.035 guidewire was advanced into the IVC with a 5-French catheter. Measurements were obtained from the right venotomy site to the proximal right atrium. In the right infraclavicular chest, a subcutaneous tunnel was created under sterile conditions and local anesthesia. 1% lidocaine with epinephrine was utilized for this. The 19 cm tip to cuff palindrome catheter was tunneled subcutaneously to the venotomy site and inserted into the SVC/RA junction through a valved  peel-away sheath. Position was confirmed with fluoroscopy. Images  were obtained for documentation. Blood was aspirated from the catheter followed by saline and heparin flushes. The appropriate volume and strength of heparin was instilled in each lumen. Caps were applied. The catheter was secured at the tunnel site with Gelfoam and a pursestring suture. The venotomy site was closed with subcuticular Vicryl suture. Dermabond was applied to the small right neck incision. A dry sterile dressing was applied. The catheter is ready for use. No immediate complications. IMPRESSION: Ultrasound and fluoroscopically guided right internal jugular tunneled hemodialysis catheter (19 cm tip to cuff palindrome catheter). Electronically Signed   By: Jerilynn Mages.  Shick M.D.   On: 09/15/2022 11:11   DG Abd Portable 1V  Result Date: 09/14/2022 CLINICAL DATA:  Evaluate NG tube placement. EXAM: PORTABLE ABDOMEN - 1 VIEW COMPARISON:  None Available. FINDINGS: Interval placement of NG tube. The tip and side port are well below the level of the GE junction within the gastric fundus. No dilated bowel loops identified. IMPRESSION: Satisfactory position of NG tube with tip and side port in the gastric fundus. Electronically Signed   By: Kerby Moors M.D.   On: 09/14/2022 13:36    Microbiology: Results for orders placed or performed during the hospital encounter of 08/24/22  MRSA Next Gen by PCR, Nasal     Status: None   Collection Time: 08/25/22  2:31 AM   Specimen: Nasal Mucosa; Nasal Swab  Result Value Ref Range Status   MRSA by PCR Next Gen NOT DETECTED NOT DETECTED Final    Comment: (NOTE) The GeneXpert MRSA Assay (FDA approved for NASAL specimens only), is one component of a comprehensive MRSA colonization surveillance program. It is not intended to diagnose MRSA infection nor to guide or monitor treatment for MRSA infections. Test performance is not FDA approved in patients less than 1 years old. Performed at [facility], [place] [address] [place], [place] [address]   Culture,  Respiratory w Gram Stain     Status: None   Collection Time: 08/26/22 12:12 PM   Specimen: Tracheal Aspirate; Respiratory  Result Value Ref Range Status   Specimen Description TRACHEAL ASPIRATE  Final   Special Requests NONE  Final   Gram Stain   Final    MODERATE WBC PRESENT,BOTH PMN AND MONONUCLEAR MODERATE GRAM NEGATIVE RODS MODERATE GRAM POSITIVE COCCI    Culture   Final    ABUNDANT STREPTOCOCCUS ANGINOSIS RARE KLEBSIELLA OXYTOCA ABUNDANT HAEMOPHILUS INFLUENZAE BETA LACTAMASE NEGATIVE Performed at [facility], [place] [address] [place], [place] [address]    Report Status 08/29/2022 FINAL  Final   Organism ID, Bacteria KLEBSIELLA OXYTOCA  Final   Organism ID, Bacteria STREPTOCOCCUS ANGINOSIS  Final      Susceptibility   Klebsiella oxytoca - MIC*    AMPICILLIN >=32 RESISTANT Resistant     CEFAZOLIN <=4 SENSITIVE Sensitive     CEFEPIME <=0.12 SENSITIVE Sensitive     CEFTAZIDIME <=1 SENSITIVE Sensitive     CEFTRIAXONE <=0.25 SENSITIVE Sensitive     CIPROFLOXACIN <=0.25 SENSITIVE Sensitive     GENTAMICIN <=1 SENSITIVE Sensitive     IMIPENEM <=0.25 SENSITIVE Sensitive     TRIMETH/SULFA <=20 SENSITIVE Sensitive     AMPICILLIN/SULBACTAM 8 SENSITIVE Sensitive     PIP/TAZO <=4 SENSITIVE Sensitive     * RARE KLEBSIELLA OXYTOCA   Streptococcus anginosis - MIC*    PENICILLIN 0.12 SENSITIVE Sensitive     CEFTRIAXONE <=0.12 SENSITIVE Sensitive  ERYTHROMYCIN 0.5 INTERMEDIATE Intermediate     LEVOFLOXACIN 1 SENSITIVE Sensitive     VANCOMYCIN 1 SENSITIVE Sensitive     * ABUNDANT STREPTOCOCCUS ANGINOSIS    Labs: CBC: Recent Labs  Lab 10/08/22 1118 10/10/22 0755 10/13/22 0340  WBC 11.1* 12.0* 9.1  HGB 10.4* 10.7* 10.7*  HCT 32.8* 33.6* 33.5*  MCV 71.6* 71.8* 70.7*  PLT 296 316 683   Basic Metabolic Panel: Recent Labs  Lab 10/08/22 1118 10/10/22 0755 10/13/22 0340 10/14/22 0303  NA 137 135 135 136  K 4.9 5.0 5.5* 4.9  CL 97* 97* 95* 97*  CO2 25 27 21* 27   GLUCOSE 81 84 74 104*  BUN 61* 55* 70* 35*  CREATININE 10.45* 10.01* 11.94* 7.85*  CALCIUM 8.7* 8.6* 8.6* 8.4*  PHOS 5.3* 5.9* 7.6*  --    Liver Function Tests: Recent Labs  Lab 10/08/22 1118 10/10/22 0755 10/13/22 0340  ALBUMIN 3.0* 3.1* 2.8*   CBG: Recent Labs  Lab 10/13/22 1337 10/13/22 1741 10/13/22 2351 10/14/22 0635 10/14/22 1149  GLUCAP 82 230* 113* 117* 134*    Discharge time spent: greater than 30 minutes.  Signed: Estill Cotta, MD [facility] 10/14/2022

## 2022-10-15 ENCOUNTER — Telehealth: Payer: Self-pay

## 2022-10-15 NOTE — Telephone Encounter (Signed)
Attempted to reach patient to schedule for revision of left forearm AV fistula vs new left upper arm AV fistula, but received message that call cannot be completed.

## 2022-10-18 ENCOUNTER — Other Ambulatory Visit: Payer: Self-pay

## 2022-10-18 DIAGNOSIS — N186 End stage renal disease: Secondary | ICD-10-CM

## 2022-10-18 NOTE — Telephone Encounter (Signed)
Spoke with Gerald Stabs at [facility]. Patient is a temporary resident at rehab center. Scheduled surgery for 11/02/22. Instructions to be faxed to [phone].

## 2022-11-01 NOTE — Progress Notes (Signed)
Per Lilia Pro at [facility] [phone]), patient is no longer at there facility.  Call patient at home number to review instructions for DOS.  No answer, "call can not be completed at this time, please try again later".  Called Sister Frederick, detailed [facility] with instructions for DOS for patient.  PCP - Demetrius Revel, NP Cardiologist - n/a  Chest x-ray - 09/07/22 EKG - 09/10/22 Stress Test - n/a ECHO - n/a Cardiac Cath - n/a  ICD Pacemaker/Loop - n/a  Sleep Study -  n/a CPAP - none      THE MORNING OF SURGERY, take 5 Units Semeglee Insulin.  If your blood sugar is less than 70 mg/dL, you will need to treat for low blood sugar: Treat a low blood sugar (less than 70 mg/dL) with  cup of clear juice (cranberry or apple), 4 glucose tablets, OR glucose gel. Recheck blood sugar in 15 minutes after treatment (to make sure it is greater than 70 mg/dL). If your blood sugar is not greater than 70 mg/dL on recheck, call [phone] for further instructions.  Anesthesia review: Yes  STOP now taking any Aspirin (unless otherwise instructed by your surgeon), Aleve, Naproxen, Ibuprofen, Motrin, Advil, Goody's, BC's, all herbal medications, fish oil, and all vitamins.

## 2022-11-01 NOTE — Progress Notes (Signed)
Spoke with Meg at [facility] at [phone].  Faxed information and instructions for DOS to [phone], ATTN: Lenor Coffin  PCP - Demetrius Revel, NP Cardiologist - n/a Nephrology - Dr Corliss Parish  Chest x-ray - 09/07/22 (1V) EKG - 09/10/22 Stress Test - n/a ECHO - 08/25/22 Cardiac Cath - n/a  ICD Pacemaker/Loop - n/a  Sleep Study -  n/a CPAP - none  Diabetes Type 2  Take 5 units of Semeglee Insulin on the morning of surgery.  If your blood sugar is less than 70 mg/dL, you will need to treat for low blood sugar: Treat a low blood sugar (less than 70 mg/dL) with  cup of clear juice (cranberry or apple), 4 glucose tablets, OR glucose gel. Recheck blood sugar in 15 minutes after treatment (to make sure it is greater than 70 mg/dL). If your blood sugar is not greater than 70 mg/dL on recheck, call [phone] for further instructions.  Anesthesia review: Yes  STOP now taking any Aspirin (unless otherwise instructed by your surgeon), Aleve, Naproxen, Ibuprofen, Motrin, Advil, Goody's, BC's, all herbal medications, fish oil, and all vitamins.

## 2022-11-01 NOTE — Anesthesia Preprocedure Evaluation (Addendum)
Anesthesia Evaluation  Patient identified by MRN, date of birth, ID band Patient awake    Reviewed: Allergy & Precautions, NPO status , Patient's Chart, lab work & pertinent test results  Airway Mallampati: II  TM Distance: >3 FB     Dental  (+) Dental Advisory Given   Pulmonary Current Smoker   breath sounds clear to auscultation       Cardiovascular hypertension, Pt. on medications  Rhythm:Regular Rate:Normal     Neuro/Psych CVA    GI/Hepatic negative GI ROS, Neg liver ROS,,,  Endo/Other  diabetes    Renal/GU ESRF and DialysisRenal disease     Musculoskeletal   Abdominal   Peds  Hematology negative hematology ROS (+)   Anesthesia Other Findings   Reproductive/Obstetrics                             Anesthesia Physical Anesthesia Plan  ASA: 3  Anesthesia Plan: MAC   Post-op Pain Management: Minimal or no pain anticipated   Induction:   PONV Risk Score and Plan: 0 and Propofol infusion and Ondansetron  Airway Management Planned: Natural Airway and Simple Face Mask  Additional Equipment:   Intra-op Plan:   Post-operative Plan:   Informed Consent: I have reviewed the patients History and Physical, chart, labs and discussed the procedure including the risks, benefits and alternatives for the proposed anesthesia with the patient or authorized representative who has indicated his/her understanding and acceptance.       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  Recent prolonged admission to [facility] 08/24/2022 through 10/14/2022 for acute CVA.  He presents with sudden onset of slurred speech and headache. He was severely hypertensive on presentation. CT head showed cerebral hemorrhage. He was  intubated in the emergency department for airway protection and admitted to PCCM/neurology service. Self extubated on 11/30.  Acute ICH was felt secondary to poorly controlled hypertension and hypertensive emergency.  MRI also showed many new acute infarcts consistent with embolism.  Echo showed EF 50 to 55%, normal valves, no source of embolism.  He was not placed on any antiplatelet therapy due to cerebral hemorrhage. Neurology did not recommend further evaluation for cardiac source of embolism because he is not a good candidate for anticoagulation.  Neurology signed off on 09/13/2022.  Patient also developed aspiration pneumonia and completed antibiotic therapy 09/29/2022 with resolution of respiratory symptoms.  During admission he was noted to have a clotted fistula with fistulogram showing poor outflow, IR placed right IJ [facility] 09/15/2022.  He was recommended follow-up with vascular surgery postdischarge for AV fistula creation.  Patient has not yet followed up outpatient with neurology.  He was seen by his PCP Demetrius Revel, NP on 10/29/2022.  Per note, he continues to have difficulty with speech and gait with left-sided weakness as a result of recent stroke.  Blood pressure at the time was well-controlled, 138/86.  Patient will need day of surgery labs and evaluation.  EKG 09/10/2022: NSR.  Rate 78.  Nonspecific ST abnormality.  TTE 08/25/2022: 1. Left ventricular ejection fraction, by estimation, is 50 to 55%. The  left ventricle has low normal function. The left ventricle has no regional  wall motion abnormalities. There is moderate left ventricular hypertrophy.  Left ventricular diastolic  parameters are consistent with Grade  I diastolic dysfunction (impaired  relaxation).  2. Right ventricular systolic function is normal. The right ventricular  size is normal.  3. The mitral valve is normal in structure. No evidence of mitral valve  regurgitation. No evidence of mitral stenosis.  4. The  aortic valve is normal in structure. Aortic valve regurgitation is  trivial. No aortic stenosis is present.  5. The inferior vena cava is normal in size with greater than 50%  respiratory variability, suggesting right atrial pressure of 3 mmHg.   Conclusion(s)/Recommendation(s): No intracardiac source of embolism  detected on this transthoracic study. Consider a transesophageal  echocardiogram to exclude cardiac source of embolism if clinically  indicated.    )        Anesthesia Quick Evaluation

## 2022-11-01 NOTE — Progress Notes (Signed)
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  Recent prolonged admission to [facility] 08/24/2022 through 10/14/2022 for acute CVA.  He presents with sudden onset of slurred speech and headache. He was severely hypertensive on presentation.  CT head showed cerebral hemorrhage.  He was intubated in the emergency department for airway protection and admitted to PCCM/neurology service.  Self extubated on 11/30.  Acute ICH was felt secondary to poorly controlled hypertension and hypertensive emergency.  MRI also showed many new acute infarcts consistent with embolism.  Echo showed EF 50 to 55%, normal valves, no source of embolism.  He was not placed on any antiplatelet therapy due to cerebral hemorrhage.  Neurology did not recommend further evaluation for cardiac source of embolism because he is not a good candidate for anticoagulation.  Neurology signed off on 09/13/2022.  Patient also developed aspiration pneumonia and completed antibiotic therapy 09/29/2022 with resolution of respiratory symptoms.  During admission he was noted to have a clotted fistula with fistulogram showing poor outflow, IR placed right IJ [facility] 09/15/2022.  He was recommended follow-up with vascular surgery postdischarge for AV fistula creation.  Patient has not yet followed up outpatient with neurology.  He was seen by his PCP Demetrius Revel, NP on 10/29/2022.  Per note, he continues to have difficulty with speech and gait with left-sided weakness as a result of recent stroke.  Blood pressure at the time was well-controlled, 138/86.  Patient will need day of surgery labs and evaluation.  EKG 09/10/2022: NSR.  Rate 78.  Nonspecific ST abnormality.  TTE 08/25/2022:  1. Left ventricular ejection fraction, by estimation, is 50 to 55%. The  left ventricle has low normal function. The left ventricle has no  regional  wall motion abnormalities. There is moderate left ventricular hypertrophy.  Left ventricular diastolic  parameters are consistent with Grade I diastolic dysfunction (impaired  relaxation).   2. Right ventricular systolic function is normal. The right ventricular  size is normal.   3. The mitral valve is normal in structure. No evidence of mitral valve  regurgitation. No evidence of mitral stenosis.   4. The aortic valve is normal in structure. Aortic valve regurgitation is  trivial. No aortic stenosis is present.   5. The inferior vena cava is normal in size with greater than 50%  respiratory variability, suggesting right atrial pressure of 3 mmHg.   Conclusion(s)/Recommendation(s): No intracardiac source of embolism  detected on this transthoracic study. Consider a transesophageal  echocardiogram to exclude cardiac source of embolism if clinically  indicated.     Wynonia Musty [facility]/Anesthesiology Phone [phone] 11/01/2022 12:14 PM

## 2022-11-02 ENCOUNTER — Encounter (HOSPITAL_COMMUNITY): Payer: Self-pay | Admitting: Vascular Surgery

## 2022-11-02 ENCOUNTER — Other Ambulatory Visit: Payer: Self-pay

## 2022-11-02 ENCOUNTER — Encounter (HOSPITAL_COMMUNITY)
Admission: RE | Disposition: A | Discharge status: Home / Self Care | Payer: Self-pay | Source: Home / Self Care | Attending: Vascular Surgery

## 2022-11-02 ENCOUNTER — Ambulatory Visit (HOSPITAL_COMMUNITY): Payer: No Typology Code available for payment source | Admitting: Physician Assistant

## 2022-11-02 ENCOUNTER — Ambulatory Visit (HOSPITAL_COMMUNITY)
Admission: RE | Admit: 2022-11-02 | Discharge: 2022-11-02 | Disposition: A | Discharge status: Home / Self Care | Payer: No Typology Code available for payment source | Attending: Vascular Surgery | Admitting: Vascular Surgery

## 2022-11-02 ENCOUNTER — Ambulatory Visit (HOSPITAL_BASED_OUTPATIENT_CLINIC_OR_DEPARTMENT_OTHER): Payer: No Typology Code available for payment source | Admitting: Physician Assistant

## 2022-11-02 DIAGNOSIS — F17210 Nicotine dependence, cigarettes, uncomplicated: Secondary | ICD-10-CM | POA: Insufficient documentation

## 2022-11-02 DIAGNOSIS — E1122 Type 2 diabetes mellitus with diabetic chronic kidney disease: Secondary | ICD-10-CM | POA: Insufficient documentation

## 2022-11-02 DIAGNOSIS — N186 End stage renal disease: Secondary | ICD-10-CM | POA: Diagnosis present

## 2022-11-02 DIAGNOSIS — I120 Hypertensive chronic kidney disease with stage 5 chronic kidney disease or end stage renal disease: Secondary | ICD-10-CM | POA: Insufficient documentation

## 2022-11-02 DIAGNOSIS — T82590A Other mechanical complication of surgically created arteriovenous fistula, initial encounter: Secondary | ICD-10-CM

## 2022-11-02 DIAGNOSIS — Z992 Dependence on renal dialysis: Secondary | ICD-10-CM

## 2022-11-02 DIAGNOSIS — T82898A Other specified complication of vascular prosthetic devices, implants and grafts, initial encounter: Secondary | ICD-10-CM | POA: Diagnosis not present

## 2022-11-02 DIAGNOSIS — N185 Chronic kidney disease, stage 5: Secondary | ICD-10-CM

## 2022-11-02 HISTORY — PX: REVISON OF ARTERIOVENOUS FISTULA: SHX6074

## 2022-11-02 LAB — GLUCOSE, CAPILLARY
Glucose-Capillary: 97 mg/dL (ref 70–99)
Glucose-Capillary: 91 mg/dL (ref 70–99)

## 2022-11-02 LAB — POCT I-STAT, CHEM 8
BUN: 31 mg/dL — ABNORMAL HIGH (ref 6–20)
Calcium, Ion: 1.1 mmol/L — ABNORMAL LOW (ref 1.15–1.40)
Chloride: 99 mmol/L (ref 98–111)
Creatinine, Ser: 9.9 mg/dL — ABNORMAL HIGH (ref 0.61–1.24)
Glucose, Bld: 92 mg/dL (ref 70–99)
HCT: 44 % (ref 39.0–52.0)
Hemoglobin: 15 g/dL (ref 13.0–17.0)
Potassium: 4.9 mmol/L (ref 3.5–5.1)
Sodium: 137 mmol/L (ref 135–145)
TCO2: 30 mmol/L (ref 22–32)

## 2022-11-02 SURGERY — REVISON OF ARTERIOVENOUS FISTULA
Anesthesia: Monitor Anesthesia Care | Site: Arm Lower | Laterality: Left

## 2022-11-02 MED ORDER — HEPARIN 6000 UNIT IRRIGATION SOLUTION
Status: DC | PRN
  Administered 2022-11-02: 1

## 2022-11-02 MED ORDER — LACTATED RINGERS IV SOLN: INTRAVENOUS | Status: DC

## 2022-11-02 MED ORDER — FENTANYL CITRATE (PF) 250 MCG/5ML IJ SOLN
INTRAMUSCULAR | Status: AC
  Filled 2022-11-02: qty 5

## 2022-11-02 MED ORDER — ORAL CARE MOUTH RINSE: 15.0000 mL | Freq: Once | OROMUCOSAL | Status: AC

## 2022-11-02 MED ORDER — LIDOCAINE-EPINEPHRINE (PF) 1 %-1:200000 IJ SOLN
INTRAMUSCULAR | Status: DC | PRN
  Administered 2022-11-02: 13 mL

## 2022-11-02 MED ORDER — HYDRALAZINE HCL 20 MG/ML IJ SOLN
10.0000 mg | Freq: Once | INTRAMUSCULAR | Status: AC
  Administered 2022-11-02: 10 mg via INTRAVENOUS

## 2022-11-02 MED ORDER — HEPARIN 6000 UNIT IRRIGATION SOLUTION
Status: AC
  Filled 2022-11-02: qty 500

## 2022-11-02 MED ORDER — PROPOFOL 500 MG/50ML IV EMUL
INTRAVENOUS | Status: DC | PRN
  Administered 2022-11-02: 150 ug/kg/min via INTRAVENOUS
  Administered 2022-11-02: 80 ug/kg/min via INTRAVENOUS

## 2022-11-02 MED ORDER — LIDOCAINE 2% (20 MG/ML) 5 ML SYRINGE
INTRAMUSCULAR | Status: AC
  Filled 2022-11-02: qty 5

## 2022-11-02 MED ORDER — CHLORHEXIDINE GLUCONATE 4 % EX LIQD: 60.0000 mL | Freq: Once | CUTANEOUS | Status: DC

## 2022-11-02 MED ORDER — LIDOCAINE HCL (PF) 1 % IJ SOLN
INTRAMUSCULAR | Status: AC
  Filled 2022-11-02: qty 30

## 2022-11-02 MED ORDER — HYDRALAZINE HCL 20 MG/ML IJ SOLN: 10.0000 mg | Freq: Once | INTRAMUSCULAR | Status: DC

## 2022-11-02 MED ORDER — PROPOFOL 10 MG/ML IV BOLUS
INTRAVENOUS | Status: DC | PRN
  Administered 2022-11-02 (×4): 20 mg via INTRAVENOUS
  Administered 2022-11-02: 40 mg via INTRAVENOUS

## 2022-11-02 MED ORDER — LIDOCAINE-EPINEPHRINE (PF) 1 %-1:200000 IJ SOLN
INTRAMUSCULAR | Status: AC
  Filled 2022-11-02: qty 30

## 2022-11-02 MED ORDER — SODIUM CHLORIDE 0.9 % IV SOLN: INTRAVENOUS | Status: DC

## 2022-11-02 MED ORDER — LIDOCAINE 2% (20 MG/ML) 5 ML SYRINGE
INTRAMUSCULAR | Status: DC | PRN
  Administered 2022-11-02: 40 mg via INTRAVENOUS

## 2022-11-02 MED ORDER — LABETALOL HCL 5 MG/ML IV SOLN
INTRAVENOUS | Status: AC
  Filled 2022-11-02: qty 4

## 2022-11-02 MED ORDER — OXYCODONE-ACETAMINOPHEN 5-325 MG PO TABS: 1.0000 | ORAL_TABLET | ORAL | 0 refills | Status: DC | PRN

## 2022-11-02 MED ORDER — 0.9 % SODIUM CHLORIDE (POUR BTL) OPTIME
TOPICAL | Status: DC | PRN
  Administered 2022-11-02: 1000 mL

## 2022-11-02 MED ORDER — LABETALOL HCL 5 MG/ML IV SOLN
10.0000 mg | Freq: Once | INTRAVENOUS | Status: AC
  Administered 2022-11-02: 10 mg via INTRAVENOUS

## 2022-11-02 MED ORDER — HYDRALAZINE HCL 20 MG/ML IJ SOLN
INTRAMUSCULAR | Status: AC
  Administered 2022-11-02: 10 mg
  Filled 2022-11-02: qty 1

## 2022-11-02 MED ORDER — MIDAZOLAM HCL 2 MG/2ML IJ SOLN
INTRAMUSCULAR | Status: AC
  Filled 2022-11-02: qty 2

## 2022-11-02 MED ORDER — FENTANYL CITRATE (PF) 250 MCG/5ML IJ SOLN
INTRAMUSCULAR | Status: DC | PRN
  Administered 2022-11-02 (×2): 25 ug via INTRAVENOUS

## 2022-11-02 MED ORDER — CEFAZOLIN SODIUM-DEXTROSE 2-4 GM/100ML-% IV SOLN
2.0000 g | INTRAVENOUS | Status: AC
  Administered 2022-11-02: 2 g via INTRAVENOUS
  Filled 2022-11-02: qty 100

## 2022-11-02 MED ORDER — CHLORHEXIDINE GLUCONATE 0.12 % MT SOLN
15.0000 mL | Freq: Once | OROMUCOSAL | Status: AC
  Administered 2022-11-02: 15 mL via OROMUCOSAL
  Filled 2022-11-02: qty 15

## 2022-11-02 MED ORDER — MIDAZOLAM HCL 2 MG/2ML IJ SOLN
INTRAMUSCULAR | Status: DC | PRN
  Administered 2022-11-02: 2 mg via INTRAVENOUS

## 2022-11-02 SURGICAL SUPPLY — 33 items
ARMBAND PINK RESTRICT EXTREMIT (MISCELLANEOUS) ×1 IMPLANT
BAG COUNTER SPONGE SURGICOUNT (BAG) ×1 IMPLANT
CANISTER SUCT 3000ML PPV (MISCELLANEOUS) ×1 IMPLANT
CANNULA VESSEL 3MM 2 BLNT TIP (CANNULA) ×1 IMPLANT
CLIP VESOCCLUDE MED 6/CT (CLIP) ×1 IMPLANT
CLIP VESOCCLUDE SM WIDE 6/CT (CLIP) ×1 IMPLANT
COVER PROBE W GEL 5X96 (DRAPES) IMPLANT
DERMABOND ADVANCED .7 DNX12 (GAUZE/BANDAGES/DRESSINGS) ×1 IMPLANT
ELECT REM PT RETURN 9FT ADLT (ELECTROSURGICAL) ×1
ELECTRODE REM PT RTRN 9FT ADLT (ELECTROSURGICAL) ×1 IMPLANT
GLOVE BIO SURGEON STRL SZ7.5 (GLOVE) ×1 IMPLANT
GLOVE BIOGEL PI IND STRL 8 (GLOVE) ×1 IMPLANT
GLOVE SURG POLY ORTHO LF SZ7.5 (GLOVE) IMPLANT
GLOVE SURG UNDER LTX SZ8 (GLOVE) ×1 IMPLANT
GOWN STRL REUS W/ TWL LRG LVL3 (GOWN DISPOSABLE) ×3 IMPLANT
GOWN STRL REUS W/TWL LRG LVL3 (GOWN DISPOSABLE) ×3
KIT BASIN OR (CUSTOM PROCEDURE TRAY) ×1 IMPLANT
KIT TURNOVER KIT B (KITS) ×1 IMPLANT
NS IRRIG 1000ML POUR BTL (IV SOLUTION) ×1 IMPLANT
PACK CV ACCESS (CUSTOM PROCEDURE TRAY) ×1 IMPLANT
PAD ARMBOARD 7.5X6 YLW CONV (MISCELLANEOUS) ×2 IMPLANT
SLING ARM FOAM STRAP LRG (SOFTGOODS) IMPLANT
SLING ARM FOAM STRAP MED (SOFTGOODS) IMPLANT
SPONGE SURGIFOAM ABS GEL 100 (HEMOSTASIS) IMPLANT
SUT MNCRL AB 4-0 PS2 18 (SUTURE) ×1 IMPLANT
SUT PROLENE 6 0 BV (SUTURE) ×1 IMPLANT
SUT SILK 3 0 (SUTURE) ×1
SUT SILK 3-0 18XBRD TIE 12 (SUTURE) IMPLANT
SUT VIC AB 3-0 SH 27 (SUTURE) ×2
SUT VIC AB 3-0 SH 27X BRD (SUTURE) ×1 IMPLANT
TOWEL GREEN STERILE (TOWEL DISPOSABLE) ×1 IMPLANT
UNDERPAD 30X36 HEAVY ABSORB (UNDERPADS AND DIAPERS) ×1 IMPLANT
WATER STERILE IRR 1000ML POUR (IV SOLUTION) ×1 IMPLANT

## 2022-11-02 NOTE — Interval H&P Note (Signed)
History and Physical Interval Note:  11/02/2022 7:39 AM  Randy Ross  has presented today for surgery, with the diagnosis of ESRD.  The various methods of treatment have been discussed with the patient and family. After consideration of risks, benefits and other options for treatment, the patient has consented to  Procedure(s): REVISION OF LEFT FOREARM ARTERIOVENOUS FISTULA VERSUS NEW LEFT UPPER ARM ARTERIOVENOUS CREATION (Left) as a surgical intervention.  The patient's history has been reviewed, patient examined, no change in status, stable for surgery.  I have reviewed the patient's chart and labs.  Questions were answered to the patient's satisfaction.     Deitra Mayo

## 2022-11-02 NOTE — Op Note (Signed)
    NAMETracie Ross    MRN: [id number] DOB: 24-Jun-1971    DATE OF OPERATION: 11/02/2022  PREOP DIAGNOSIS:    End-stage renal disease  POSTOP DIAGNOSIS:    Same  PROCEDURE:    Ligation of competing branches left forearm AV fistula Superficialization of left forearm AV fistula  SURGEON: Judeth Cornfield. Scot Dock, MD  ASSIST: Randy Ross, RNFA  ANESTHESIA: Local with sedation  EBL: Minimal  INDICATIONS:    Randy Ross is a 52 y.o. male who had a left radiocephalic fistula.  He underwent a fistulogram by interventional radiology which showed multiple competing branches.  He was scheduled for ligation of the competing branches and superficialization.   FINDINGS:   The cephalic vein branched into 2 large branches in the mid forearm.  I mapped the largest of these branches and elected to ligate the more lateral branch as this appeared to die in the mid upper arm.  Multiple competing branches were ligated and the vein was superficialzed.i  TECHNIQUE:   The patient was taken to the operating room and sedated by anesthesia.  The left arm was prepped and draped in usual sterile fashion.  I looked at the vein myself with the SonoSite in the mid forearm the vein branches into 2 large branches.  I followed the lateral branch and this died in the mid upper arm.  The more medial branch seemed to be the dominant runoff vein.  A longitudinal incision was made over the large branch point after the skin was anesthetized.  Multiple branches were divided gaited between 3-0 silk ties and divided.  The larger branch which was lateral was ligated with 2-0 silk tie.  I traced the vein up towards the antecubital fossa.  A separate longitudinal incision was made after the skin was anesthetized.  The vein was dissected free circumferentially almost up to the antecubital level.  There was also a large competing branch down by the wrist a small incision was made over this area after the skin was  anesthetized.  This branch was ligated.  I then excised some adipose tissue overlying the fistula and hemostasis was obtained.  Each of the incisions was closed with a deep layer 3-0 Vicryl and the skin closed with 4-0 Monocryl.  Dermabond was applied.  The patient tolerated the procedure well and was transferred to the recovery room in stable condition.  All needle and sponge counts were correct.  Given the complexity of the case a first assistant was necessary in order to expedient the procedure and safely perform the technical aspects of the operation.  Deitra Mayo, MD, FACS [facility]  DATE OF DICTATION:   11/02/2022

## 2022-11-02 NOTE — Transfer of Care (Signed)
Immediate Anesthesia Transfer of Care Note  Patient: Randy Ross  Procedure(s) Performed: REVISION OF LEFT FOREARM ARTERIOVENOUS FISTULA WITH LIGATION OF COMPETING BRANCHES AND SUPERFICIALIZATION (Left: Arm Lower)  Patient Location: PACU  Anesthesia Type:MAC  Level of Consciousness: drowsy  Airway & Oxygen Therapy: Patient Spontanous Breathing and Patient connected to nasal cannula oxygen  Post-op Assessment: Report given to RN and Post -op Vital signs reviewed and stable  Post vital signs: Reviewed and stable  Last Vitals:  Vitals Value Taken Time  BP 173/83 11/02/22 0945  Temp    Pulse 58 11/02/22 0946  Resp 27 11/02/22 0946  SpO2 100 % 11/02/22 0946  Vitals shown include unvalidated device data.  Last Pain:  Vitals:   11/02/22 0747  TempSrc: Oral  PainSc:          Complications: No notable events documented.

## 2022-11-02 NOTE — Anesthesia Procedure Notes (Signed)
Procedure Name: MAC Date/Time: 11/02/2022 8:20 AM  Performed by: Lorie Phenix, CRNAPre-anesthesia Checklist: Patient identified, Emergency Drugs available, Suction available, Patient being monitored and Timeout performed Oxygen Delivery Method: Nasal cannula Placement Confirmation: positive ETCO2

## 2022-11-03 ENCOUNTER — Encounter (HOSPITAL_COMMUNITY): Payer: Self-pay | Admitting: Vascular Surgery

## 2022-11-03 NOTE — Anesthesia Postprocedure Evaluation (Signed)
Anesthesia Post Note  Patient: Randy Ross  Procedure(s) Performed: REVISION OF LEFT FOREARM ARTERIOVENOUS FISTULA WITH LIGATION OF COMPETING BRANCHES AND SUPERFICIALIZATION (Left: Arm Lower)     Patient location during evaluation: PACU Anesthesia Type: MAC Level of consciousness: awake and alert Pain management: pain level controlled Vital Signs Assessment: post-procedure vital signs reviewed and stable Respiratory status: spontaneous breathing, nonlabored ventilation, respiratory function stable and patient connected to nasal cannula oxygen Cardiovascular status: stable and blood pressure returned to baseline Postop Assessment: no apparent nausea or vomiting Anesthetic complications: no   No notable events documented.  Last Vitals:  Vitals:   11/02/22 1030 11/02/22 1045  BP: (!) 201/88 (!) 174/85  Pulse: (!) 59 60  Resp: 14 20  Temp:  36.4 C  SpO2: 100% 100%    Last Pain:  Vitals:   11/02/22 1045  TempSrc:   PainSc: 0-No pain                 Tiajuana Amass

## 2022-11-05 ENCOUNTER — Telehealth: Payer: Self-pay | Admitting: Vascular Surgery

## 2022-11-05 NOTE — Telephone Encounter (Signed)
-----  Message from Angelia Mould, MD sent at 11/02/2022  9:32 AM EST ----- Regarding: charge and f/u  PROCEDURE:   Ligation of competing branches left forearm AV fistula Superficialization of left forearm AV fistula  SURGEON: Judeth Cornfield. Scot Dock, MD  ASSIST: Izetta Dakin, RNFA   He will need a follow-up visit in 6 weeks on the PA schedule with a duplex of his left forearm fistula at that time.  I believe he is on Monday Wednesday Friday dialysis.  Thank you. CD

## 2022-11-20 ENCOUNTER — Encounter: Payer: Self-pay | Admitting: Vascular Surgery

## 2022-11-27 DIAGNOSIS — D631 Anemia in chronic kidney disease: Secondary | ICD-10-CM | POA: Diagnosis not present

## 2022-11-27 DIAGNOSIS — E1165 Type 2 diabetes mellitus with hyperglycemia: Secondary | ICD-10-CM | POA: Diagnosis not present

## 2022-11-27 DIAGNOSIS — I69291 Dysphagia following other nontraumatic intracranial hemorrhage: Secondary | ICD-10-CM | POA: Diagnosis not present

## 2022-11-27 DIAGNOSIS — I69315 Cognitive social or emotional deficit following cerebral infarction: Secondary | ICD-10-CM | POA: Diagnosis not present

## 2022-11-27 DIAGNOSIS — I69254 Hemiplegia and hemiparesis following other nontraumatic intracranial hemorrhage affecting left non-dominant side: Secondary | ICD-10-CM | POA: Diagnosis not present

## 2022-11-27 DIAGNOSIS — I69318 Other symptoms and signs involving cognitive functions following cerebral infarction: Secondary | ICD-10-CM | POA: Diagnosis not present

## 2022-11-27 DIAGNOSIS — E875 Hyperkalemia: Secondary | ICD-10-CM | POA: Diagnosis not present

## 2022-11-27 DIAGNOSIS — Z794 Long term (current) use of insulin: Secondary | ICD-10-CM | POA: Diagnosis not present

## 2022-11-27 DIAGNOSIS — I120 Hypertensive chronic kidney disease with stage 5 chronic kidney disease or end stage renal disease: Secondary | ICD-10-CM | POA: Diagnosis not present

## 2022-11-27 DIAGNOSIS — N186 End stage renal disease: Secondary | ICD-10-CM | POA: Diagnosis not present

## 2022-11-27 DIAGNOSIS — E211 Secondary hyperparathyroidism, not elsewhere classified: Secondary | ICD-10-CM | POA: Diagnosis not present

## 2022-11-27 DIAGNOSIS — N2581 Secondary hyperparathyroidism of renal origin: Secondary | ICD-10-CM | POA: Diagnosis not present

## 2022-11-27 DIAGNOSIS — Z8719 Personal history of other diseases of the digestive system: Secondary | ICD-10-CM | POA: Diagnosis not present

## 2022-11-27 DIAGNOSIS — E1122 Type 2 diabetes mellitus with diabetic chronic kidney disease: Secondary | ICD-10-CM | POA: Diagnosis not present

## 2022-11-27 DIAGNOSIS — Z992 Dependence on renal dialysis: Secondary | ICD-10-CM | POA: Diagnosis not present

## 2022-11-27 DIAGNOSIS — I69322 Dysarthria following cerebral infarction: Secondary | ICD-10-CM | POA: Diagnosis not present

## 2022-11-27 DIAGNOSIS — G4720 Circadian rhythm sleep disorder, unspecified type: Secondary | ICD-10-CM | POA: Diagnosis not present

## 2022-11-27 DIAGNOSIS — G936 Cerebral edema: Secondary | ICD-10-CM | POA: Diagnosis not present

## 2022-11-27 DIAGNOSIS — E785 Hyperlipidemia, unspecified: Secondary | ICD-10-CM | POA: Diagnosis not present

## 2022-11-30 DIAGNOSIS — I69322 Dysarthria following cerebral infarction: Secondary | ICD-10-CM | POA: Diagnosis not present

## 2022-11-30 DIAGNOSIS — I120 Hypertensive chronic kidney disease with stage 5 chronic kidney disease or end stage renal disease: Secondary | ICD-10-CM | POA: Diagnosis not present

## 2022-11-30 DIAGNOSIS — I69315 Cognitive social or emotional deficit following cerebral infarction: Secondary | ICD-10-CM | POA: Diagnosis not present

## 2022-11-30 DIAGNOSIS — E1122 Type 2 diabetes mellitus with diabetic chronic kidney disease: Secondary | ICD-10-CM | POA: Diagnosis not present

## 2022-11-30 DIAGNOSIS — I69254 Hemiplegia and hemiparesis following other nontraumatic intracranial hemorrhage affecting left non-dominant side: Secondary | ICD-10-CM | POA: Diagnosis not present

## 2022-11-30 DIAGNOSIS — I69291 Dysphagia following other nontraumatic intracranial hemorrhage: Secondary | ICD-10-CM | POA: Diagnosis not present

## 2022-12-07 DIAGNOSIS — I69322 Dysarthria following cerebral infarction: Secondary | ICD-10-CM | POA: Diagnosis not present

## 2022-12-07 DIAGNOSIS — I69254 Hemiplegia and hemiparesis following other nontraumatic intracranial hemorrhage affecting left non-dominant side: Secondary | ICD-10-CM | POA: Diagnosis not present

## 2022-12-07 DIAGNOSIS — I69315 Cognitive social or emotional deficit following cerebral infarction: Secondary | ICD-10-CM | POA: Diagnosis not present

## 2022-12-07 DIAGNOSIS — I69291 Dysphagia following other nontraumatic intracranial hemorrhage: Secondary | ICD-10-CM | POA: Diagnosis not present

## 2022-12-07 DIAGNOSIS — E1122 Type 2 diabetes mellitus with diabetic chronic kidney disease: Secondary | ICD-10-CM | POA: Diagnosis not present

## 2022-12-07 DIAGNOSIS — I120 Hypertensive chronic kidney disease with stage 5 chronic kidney disease or end stage renal disease: Secondary | ICD-10-CM | POA: Diagnosis not present

## 2022-12-08 DIAGNOSIS — I69291 Dysphagia following other nontraumatic intracranial hemorrhage: Secondary | ICD-10-CM | POA: Diagnosis not present

## 2022-12-08 DIAGNOSIS — I69322 Dysarthria following cerebral infarction: Secondary | ICD-10-CM | POA: Diagnosis not present

## 2022-12-08 DIAGNOSIS — I120 Hypertensive chronic kidney disease with stage 5 chronic kidney disease or end stage renal disease: Secondary | ICD-10-CM | POA: Diagnosis not present

## 2022-12-08 DIAGNOSIS — I69315 Cognitive social or emotional deficit following cerebral infarction: Secondary | ICD-10-CM | POA: Diagnosis not present

## 2022-12-08 DIAGNOSIS — I69254 Hemiplegia and hemiparesis following other nontraumatic intracranial hemorrhage affecting left non-dominant side: Secondary | ICD-10-CM | POA: Diagnosis not present

## 2022-12-08 DIAGNOSIS — E1122 Type 2 diabetes mellitus with diabetic chronic kidney disease: Secondary | ICD-10-CM | POA: Diagnosis not present

## 2022-12-09 DIAGNOSIS — E1122 Type 2 diabetes mellitus with diabetic chronic kidney disease: Secondary | ICD-10-CM | POA: Diagnosis not present

## 2022-12-09 DIAGNOSIS — I69291 Dysphagia following other nontraumatic intracranial hemorrhage: Secondary | ICD-10-CM | POA: Diagnosis not present

## 2022-12-09 DIAGNOSIS — I69315 Cognitive social or emotional deficit following cerebral infarction: Secondary | ICD-10-CM | POA: Diagnosis not present

## 2022-12-09 DIAGNOSIS — Z992 Dependence on renal dialysis: Secondary | ICD-10-CM | POA: Diagnosis not present

## 2022-12-09 DIAGNOSIS — N186 End stage renal disease: Secondary | ICD-10-CM | POA: Diagnosis not present

## 2022-12-09 DIAGNOSIS — I120 Hypertensive chronic kidney disease with stage 5 chronic kidney disease or end stage renal disease: Secondary | ICD-10-CM | POA: Diagnosis not present

## 2022-12-09 DIAGNOSIS — I69254 Hemiplegia and hemiparesis following other nontraumatic intracranial hemorrhage affecting left non-dominant side: Secondary | ICD-10-CM | POA: Diagnosis not present

## 2022-12-09 DIAGNOSIS — I69322 Dysarthria following cerebral infarction: Secondary | ICD-10-CM | POA: Diagnosis not present

## 2022-12-14 DIAGNOSIS — I69254 Hemiplegia and hemiparesis following other nontraumatic intracranial hemorrhage affecting left non-dominant side: Secondary | ICD-10-CM | POA: Diagnosis not present

## 2022-12-14 DIAGNOSIS — D631 Anemia in chronic kidney disease: Secondary | ICD-10-CM | POA: Diagnosis not present

## 2022-12-14 DIAGNOSIS — I69291 Dysphagia following other nontraumatic intracranial hemorrhage: Secondary | ICD-10-CM | POA: Diagnosis not present

## 2022-12-14 DIAGNOSIS — Z992 Dependence on renal dialysis: Secondary | ICD-10-CM | POA: Diagnosis not present

## 2022-12-14 DIAGNOSIS — G4720 Circadian rhythm sleep disorder, unspecified type: Secondary | ICD-10-CM | POA: Diagnosis not present

## 2022-12-14 DIAGNOSIS — N2581 Secondary hyperparathyroidism of renal origin: Secondary | ICD-10-CM | POA: Diagnosis not present

## 2022-12-14 DIAGNOSIS — Z794 Long term (current) use of insulin: Secondary | ICD-10-CM | POA: Diagnosis not present

## 2022-12-14 DIAGNOSIS — E211 Secondary hyperparathyroidism, not elsewhere classified: Secondary | ICD-10-CM | POA: Diagnosis not present

## 2022-12-14 DIAGNOSIS — N186 End stage renal disease: Secondary | ICD-10-CM | POA: Diagnosis not present

## 2022-12-14 DIAGNOSIS — E1165 Type 2 diabetes mellitus with hyperglycemia: Secondary | ICD-10-CM | POA: Diagnosis not present

## 2022-12-14 DIAGNOSIS — G936 Cerebral edema: Secondary | ICD-10-CM | POA: Diagnosis not present

## 2022-12-14 DIAGNOSIS — Z8719 Personal history of other diseases of the digestive system: Secondary | ICD-10-CM | POA: Diagnosis not present

## 2022-12-14 DIAGNOSIS — E1122 Type 2 diabetes mellitus with diabetic chronic kidney disease: Secondary | ICD-10-CM | POA: Diagnosis not present

## 2022-12-14 DIAGNOSIS — E875 Hyperkalemia: Secondary | ICD-10-CM | POA: Diagnosis not present

## 2022-12-14 DIAGNOSIS — I120 Hypertensive chronic kidney disease with stage 5 chronic kidney disease or end stage renal disease: Secondary | ICD-10-CM | POA: Diagnosis not present

## 2022-12-16 DIAGNOSIS — I69291 Dysphagia following other nontraumatic intracranial hemorrhage: Secondary | ICD-10-CM | POA: Diagnosis not present

## 2022-12-16 DIAGNOSIS — N186 End stage renal disease: Secondary | ICD-10-CM | POA: Diagnosis not present

## 2022-12-16 DIAGNOSIS — G936 Cerebral edema: Secondary | ICD-10-CM | POA: Diagnosis not present

## 2022-12-16 DIAGNOSIS — E211 Secondary hyperparathyroidism, not elsewhere classified: Secondary | ICD-10-CM | POA: Diagnosis not present

## 2022-12-16 DIAGNOSIS — N2581 Secondary hyperparathyroidism of renal origin: Secondary | ICD-10-CM | POA: Diagnosis not present

## 2022-12-16 DIAGNOSIS — E1165 Type 2 diabetes mellitus with hyperglycemia: Secondary | ICD-10-CM | POA: Diagnosis not present

## 2022-12-16 DIAGNOSIS — Z992 Dependence on renal dialysis: Secondary | ICD-10-CM | POA: Diagnosis not present

## 2022-12-16 DIAGNOSIS — D631 Anemia in chronic kidney disease: Secondary | ICD-10-CM | POA: Diagnosis not present

## 2022-12-16 DIAGNOSIS — Z8719 Personal history of other diseases of the digestive system: Secondary | ICD-10-CM | POA: Diagnosis not present

## 2022-12-16 DIAGNOSIS — I69254 Hemiplegia and hemiparesis following other nontraumatic intracranial hemorrhage affecting left non-dominant side: Secondary | ICD-10-CM | POA: Diagnosis not present

## 2022-12-16 DIAGNOSIS — Z794 Long term (current) use of insulin: Secondary | ICD-10-CM | POA: Diagnosis not present

## 2022-12-16 DIAGNOSIS — G4720 Circadian rhythm sleep disorder, unspecified type: Secondary | ICD-10-CM | POA: Diagnosis not present

## 2022-12-16 DIAGNOSIS — I120 Hypertensive chronic kidney disease with stage 5 chronic kidney disease or end stage renal disease: Secondary | ICD-10-CM | POA: Diagnosis not present

## 2022-12-16 DIAGNOSIS — E1122 Type 2 diabetes mellitus with diabetic chronic kidney disease: Secondary | ICD-10-CM | POA: Diagnosis not present

## 2022-12-16 DIAGNOSIS — E875 Hyperkalemia: Secondary | ICD-10-CM | POA: Diagnosis not present

## 2022-12-21 DIAGNOSIS — Z794 Long term (current) use of insulin: Secondary | ICD-10-CM | POA: Diagnosis not present

## 2022-12-21 DIAGNOSIS — Z992 Dependence on renal dialysis: Secondary | ICD-10-CM | POA: Diagnosis not present

## 2022-12-21 DIAGNOSIS — E1165 Type 2 diabetes mellitus with hyperglycemia: Secondary | ICD-10-CM | POA: Diagnosis not present

## 2022-12-21 DIAGNOSIS — E1122 Type 2 diabetes mellitus with diabetic chronic kidney disease: Secondary | ICD-10-CM | POA: Diagnosis not present

## 2022-12-21 DIAGNOSIS — E211 Secondary hyperparathyroidism, not elsewhere classified: Secondary | ICD-10-CM | POA: Diagnosis not present

## 2022-12-21 DIAGNOSIS — I69291 Dysphagia following other nontraumatic intracranial hemorrhage: Secondary | ICD-10-CM | POA: Diagnosis not present

## 2022-12-21 DIAGNOSIS — N2581 Secondary hyperparathyroidism of renal origin: Secondary | ICD-10-CM | POA: Diagnosis not present

## 2022-12-21 DIAGNOSIS — Z8719 Personal history of other diseases of the digestive system: Secondary | ICD-10-CM | POA: Diagnosis not present

## 2022-12-21 DIAGNOSIS — E875 Hyperkalemia: Secondary | ICD-10-CM | POA: Diagnosis not present

## 2022-12-21 DIAGNOSIS — D631 Anemia in chronic kidney disease: Secondary | ICD-10-CM | POA: Diagnosis not present

## 2022-12-21 DIAGNOSIS — I120 Hypertensive chronic kidney disease with stage 5 chronic kidney disease or end stage renal disease: Secondary | ICD-10-CM | POA: Diagnosis not present

## 2022-12-21 DIAGNOSIS — I69254 Hemiplegia and hemiparesis following other nontraumatic intracranial hemorrhage affecting left non-dominant side: Secondary | ICD-10-CM | POA: Diagnosis not present

## 2022-12-21 DIAGNOSIS — G936 Cerebral edema: Secondary | ICD-10-CM | POA: Diagnosis not present

## 2022-12-21 DIAGNOSIS — G4720 Circadian rhythm sleep disorder, unspecified type: Secondary | ICD-10-CM | POA: Diagnosis not present

## 2022-12-21 DIAGNOSIS — N186 End stage renal disease: Secondary | ICD-10-CM | POA: Diagnosis not present

## 2022-12-23 DIAGNOSIS — E211 Secondary hyperparathyroidism, not elsewhere classified: Secondary | ICD-10-CM | POA: Diagnosis not present

## 2022-12-23 DIAGNOSIS — I69291 Dysphagia following other nontraumatic intracranial hemorrhage: Secondary | ICD-10-CM | POA: Diagnosis not present

## 2022-12-23 DIAGNOSIS — E1165 Type 2 diabetes mellitus with hyperglycemia: Secondary | ICD-10-CM | POA: Diagnosis not present

## 2022-12-23 DIAGNOSIS — N186 End stage renal disease: Secondary | ICD-10-CM | POA: Diagnosis not present

## 2022-12-23 DIAGNOSIS — Z992 Dependence on renal dialysis: Secondary | ICD-10-CM | POA: Diagnosis not present

## 2022-12-23 DIAGNOSIS — E1122 Type 2 diabetes mellitus with diabetic chronic kidney disease: Secondary | ICD-10-CM | POA: Diagnosis not present

## 2022-12-23 DIAGNOSIS — I69254 Hemiplegia and hemiparesis following other nontraumatic intracranial hemorrhage affecting left non-dominant side: Secondary | ICD-10-CM | POA: Diagnosis not present

## 2022-12-23 DIAGNOSIS — G936 Cerebral edema: Secondary | ICD-10-CM | POA: Diagnosis not present

## 2022-12-23 DIAGNOSIS — N2581 Secondary hyperparathyroidism of renal origin: Secondary | ICD-10-CM | POA: Diagnosis not present

## 2022-12-23 DIAGNOSIS — E875 Hyperkalemia: Secondary | ICD-10-CM | POA: Diagnosis not present

## 2022-12-23 DIAGNOSIS — I120 Hypertensive chronic kidney disease with stage 5 chronic kidney disease or end stage renal disease: Secondary | ICD-10-CM | POA: Diagnosis not present

## 2022-12-23 DIAGNOSIS — G4720 Circadian rhythm sleep disorder, unspecified type: Secondary | ICD-10-CM | POA: Diagnosis not present

## 2022-12-23 DIAGNOSIS — D631 Anemia in chronic kidney disease: Secondary | ICD-10-CM | POA: Diagnosis not present

## 2022-12-23 DIAGNOSIS — Z8719 Personal history of other diseases of the digestive system: Secondary | ICD-10-CM | POA: Diagnosis not present

## 2022-12-23 DIAGNOSIS — Z794 Long term (current) use of insulin: Secondary | ICD-10-CM | POA: Diagnosis not present

## 2022-12-28 DIAGNOSIS — N2581 Secondary hyperparathyroidism of renal origin: Secondary | ICD-10-CM | POA: Diagnosis not present

## 2022-12-28 DIAGNOSIS — Z8719 Personal history of other diseases of the digestive system: Secondary | ICD-10-CM | POA: Diagnosis not present

## 2022-12-28 DIAGNOSIS — Z992 Dependence on renal dialysis: Secondary | ICD-10-CM | POA: Diagnosis not present

## 2022-12-28 DIAGNOSIS — Z794 Long term (current) use of insulin: Secondary | ICD-10-CM | POA: Diagnosis not present

## 2022-12-28 DIAGNOSIS — G4720 Circadian rhythm sleep disorder, unspecified type: Secondary | ICD-10-CM | POA: Diagnosis not present

## 2022-12-28 DIAGNOSIS — E1165 Type 2 diabetes mellitus with hyperglycemia: Secondary | ICD-10-CM | POA: Diagnosis not present

## 2022-12-28 DIAGNOSIS — G936 Cerebral edema: Secondary | ICD-10-CM | POA: Diagnosis not present

## 2022-12-28 DIAGNOSIS — N186 End stage renal disease: Secondary | ICD-10-CM | POA: Diagnosis not present

## 2022-12-28 DIAGNOSIS — D631 Anemia in chronic kidney disease: Secondary | ICD-10-CM | POA: Diagnosis not present

## 2022-12-28 DIAGNOSIS — E1122 Type 2 diabetes mellitus with diabetic chronic kidney disease: Secondary | ICD-10-CM | POA: Diagnosis not present

## 2022-12-28 DIAGNOSIS — I69254 Hemiplegia and hemiparesis following other nontraumatic intracranial hemorrhage affecting left non-dominant side: Secondary | ICD-10-CM | POA: Diagnosis not present

## 2022-12-28 DIAGNOSIS — E875 Hyperkalemia: Secondary | ICD-10-CM | POA: Diagnosis not present

## 2022-12-28 DIAGNOSIS — I69291 Dysphagia following other nontraumatic intracranial hemorrhage: Secondary | ICD-10-CM | POA: Diagnosis not present

## 2022-12-28 DIAGNOSIS — I120 Hypertensive chronic kidney disease with stage 5 chronic kidney disease or end stage renal disease: Secondary | ICD-10-CM | POA: Diagnosis not present

## 2022-12-28 DIAGNOSIS — E211 Secondary hyperparathyroidism, not elsewhere classified: Secondary | ICD-10-CM | POA: Diagnosis not present

## 2023-01-06 DIAGNOSIS — N2581 Secondary hyperparathyroidism of renal origin: Secondary | ICD-10-CM | POA: Diagnosis not present

## 2023-01-06 DIAGNOSIS — N186 End stage renal disease: Secondary | ICD-10-CM | POA: Diagnosis not present

## 2023-01-06 DIAGNOSIS — Z794 Long term (current) use of insulin: Secondary | ICD-10-CM | POA: Diagnosis not present

## 2023-01-06 DIAGNOSIS — D631 Anemia in chronic kidney disease: Secondary | ICD-10-CM | POA: Diagnosis not present

## 2023-01-06 DIAGNOSIS — E211 Secondary hyperparathyroidism, not elsewhere classified: Secondary | ICD-10-CM | POA: Diagnosis not present

## 2023-01-06 DIAGNOSIS — E1165 Type 2 diabetes mellitus with hyperglycemia: Secondary | ICD-10-CM | POA: Diagnosis not present

## 2023-01-06 DIAGNOSIS — I69291 Dysphagia following other nontraumatic intracranial hemorrhage: Secondary | ICD-10-CM | POA: Diagnosis not present

## 2023-01-06 DIAGNOSIS — E1122 Type 2 diabetes mellitus with diabetic chronic kidney disease: Secondary | ICD-10-CM | POA: Diagnosis not present

## 2023-01-06 DIAGNOSIS — Z8719 Personal history of other diseases of the digestive system: Secondary | ICD-10-CM | POA: Diagnosis not present

## 2023-01-06 DIAGNOSIS — G4720 Circadian rhythm sleep disorder, unspecified type: Secondary | ICD-10-CM | POA: Diagnosis not present

## 2023-01-06 DIAGNOSIS — E875 Hyperkalemia: Secondary | ICD-10-CM | POA: Diagnosis not present

## 2023-01-06 DIAGNOSIS — Z992 Dependence on renal dialysis: Secondary | ICD-10-CM | POA: Diagnosis not present

## 2023-01-06 DIAGNOSIS — I120 Hypertensive chronic kidney disease with stage 5 chronic kidney disease or end stage renal disease: Secondary | ICD-10-CM | POA: Diagnosis not present

## 2023-01-06 DIAGNOSIS — G936 Cerebral edema: Secondary | ICD-10-CM | POA: Diagnosis not present

## 2023-01-06 DIAGNOSIS — I69254 Hemiplegia and hemiparesis following other nontraumatic intracranial hemorrhage affecting left non-dominant side: Secondary | ICD-10-CM | POA: Diagnosis not present

## 2023-01-09 DIAGNOSIS — Z992 Dependence on renal dialysis: Secondary | ICD-10-CM | POA: Diagnosis not present

## 2023-01-09 DIAGNOSIS — E1122 Type 2 diabetes mellitus with diabetic chronic kidney disease: Secondary | ICD-10-CM | POA: Diagnosis not present

## 2023-01-09 DIAGNOSIS — N186 End stage renal disease: Secondary | ICD-10-CM | POA: Diagnosis not present

## 2023-01-11 DIAGNOSIS — D631 Anemia in chronic kidney disease: Secondary | ICD-10-CM | POA: Diagnosis not present

## 2023-01-11 DIAGNOSIS — G936 Cerebral edema: Secondary | ICD-10-CM | POA: Diagnosis not present

## 2023-01-11 DIAGNOSIS — N186 End stage renal disease: Secondary | ICD-10-CM | POA: Diagnosis not present

## 2023-01-11 DIAGNOSIS — I69291 Dysphagia following other nontraumatic intracranial hemorrhage: Secondary | ICD-10-CM | POA: Diagnosis not present

## 2023-01-11 DIAGNOSIS — N2581 Secondary hyperparathyroidism of renal origin: Secondary | ICD-10-CM | POA: Diagnosis not present

## 2023-01-11 DIAGNOSIS — Z794 Long term (current) use of insulin: Secondary | ICD-10-CM | POA: Diagnosis not present

## 2023-01-11 DIAGNOSIS — E1122 Type 2 diabetes mellitus with diabetic chronic kidney disease: Secondary | ICD-10-CM | POA: Diagnosis not present

## 2023-01-11 DIAGNOSIS — E875 Hyperkalemia: Secondary | ICD-10-CM | POA: Diagnosis not present

## 2023-01-11 DIAGNOSIS — Z8719 Personal history of other diseases of the digestive system: Secondary | ICD-10-CM | POA: Diagnosis not present

## 2023-01-11 DIAGNOSIS — E1165 Type 2 diabetes mellitus with hyperglycemia: Secondary | ICD-10-CM | POA: Diagnosis not present

## 2023-01-11 DIAGNOSIS — E211 Secondary hyperparathyroidism, not elsewhere classified: Secondary | ICD-10-CM | POA: Diagnosis not present

## 2023-01-11 DIAGNOSIS — I69254 Hemiplegia and hemiparesis following other nontraumatic intracranial hemorrhage affecting left non-dominant side: Secondary | ICD-10-CM | POA: Diagnosis not present

## 2023-01-11 DIAGNOSIS — I120 Hypertensive chronic kidney disease with stage 5 chronic kidney disease or end stage renal disease: Secondary | ICD-10-CM | POA: Diagnosis not present

## 2023-01-11 DIAGNOSIS — Z992 Dependence on renal dialysis: Secondary | ICD-10-CM | POA: Diagnosis not present

## 2023-01-11 DIAGNOSIS — G4720 Circadian rhythm sleep disorder, unspecified type: Secondary | ICD-10-CM | POA: Diagnosis not present

## 2023-01-14 ENCOUNTER — Emergency Department (HOSPITAL_COMMUNITY): Payer: 59

## 2023-01-14 ENCOUNTER — Encounter (HOSPITAL_COMMUNITY): Payer: Self-pay

## 2023-01-14 ENCOUNTER — Emergency Department (HOSPITAL_COMMUNITY)
Admission: EM | Admit: 2023-01-14 | Discharge: 2023-01-14 | Disposition: A | Discharge status: Home / Self Care | Payer: 59 | Attending: Emergency Medicine | Admitting: Emergency Medicine

## 2023-01-14 DIAGNOSIS — Z79899 Other long term (current) drug therapy: Secondary | ICD-10-CM | POA: Diagnosis not present

## 2023-01-14 DIAGNOSIS — Z992 Dependence on renal dialysis: Secondary | ICD-10-CM | POA: Diagnosis not present

## 2023-01-14 DIAGNOSIS — R29810 Facial weakness: Secondary | ICD-10-CM | POA: Diagnosis not present

## 2023-01-14 DIAGNOSIS — R4182 Altered mental status, unspecified: Secondary | ICD-10-CM | POA: Insufficient documentation

## 2023-01-14 DIAGNOSIS — Z789 Other specified health status: Secondary | ICD-10-CM | POA: Diagnosis not present

## 2023-01-14 DIAGNOSIS — R404 Transient alteration of awareness: Secondary | ICD-10-CM | POA: Diagnosis not present

## 2023-01-14 DIAGNOSIS — I159 Secondary hypertension, unspecified: Secondary | ICD-10-CM

## 2023-01-14 DIAGNOSIS — R4781 Slurred speech: Secondary | ICD-10-CM | POA: Diagnosis not present

## 2023-01-14 DIAGNOSIS — I10 Essential (primary) hypertension: Secondary | ICD-10-CM | POA: Diagnosis not present

## 2023-01-14 LAB — COMPREHENSIVE METABOLIC PANEL
ALT: 16 U/L (ref 0–44)
AST: 13 U/L — ABNORMAL LOW (ref 15–41)
Albumin: 3.7 g/dL (ref 3.5–5.0)
Alkaline Phosphatase: 83 U/L (ref 38–126)
Anion gap: 12 (ref 5–15)
BUN: 17 mg/dL (ref 6–20)
CO2: 30 mmol/L (ref 22–32)
Calcium: 8.4 mg/dL — ABNORMAL LOW (ref 8.9–10.3)
Chloride: 93 mmol/L — ABNORMAL LOW (ref 98–111)
Creatinine, Ser: 5.01 mg/dL — ABNORMAL HIGH (ref 0.61–1.24)
GFR, Estimated: 13 mL/min — ABNORMAL LOW (ref >60)
Glucose, Bld: 100 mg/dL — ABNORMAL HIGH (ref 70–99)
Potassium: 3.5 mmol/L (ref 3.5–5.1)
Sodium: 135 mmol/L (ref 135–145)
Total Bilirubin: 0.7 mg/dL (ref 0.3–1.2)
Total Protein: 8.4 g/dL — ABNORMAL HIGH (ref 6.5–8.1)

## 2023-01-14 LAB — CBC
HCT: 30.1 % — ABNORMAL LOW (ref 39.0–52.0)
Hemoglobin: 9.4 g/dL — ABNORMAL LOW (ref 13.0–17.0)
MCH: 19.9 pg — ABNORMAL LOW (ref 26.0–34.0)
MCHC: 31.2 g/dL (ref 30.0–36.0)
MCV: 63.8 fL — ABNORMAL LOW (ref 80.0–100.0)
Platelets: 249 10*3/uL (ref 150–400)
RBC: 4.72 MIL/uL (ref 4.22–5.81)
RDW: 22.3 % — ABNORMAL HIGH (ref 11.5–15.5)
WBC: 10.6 10*3/uL — ABNORMAL HIGH (ref 4.0–10.5)
nRBC: 0 % (ref 0.0–0.2)

## 2023-01-14 LAB — AMMONIA: Ammonia: 29 umol/L (ref 9–35)

## 2023-01-14 LAB — CBG MONITORING, ED: Glucose-Capillary: 86 mg/dL (ref 70–99)

## 2023-01-14 MED ORDER — LABETALOL HCL 5 MG/ML IV SOLN
5.0000 mg | Freq: Once | INTRAVENOUS | Status: AC
  Administered 2023-01-14: 5 mg via INTRAVENOUS
  Filled 2023-01-14: qty 4

## 2023-01-14 MED ORDER — HYDRALAZINE HCL 20 MG/ML IJ SOLN
10.0000 mg | Freq: Once | INTRAMUSCULAR | Status: AC
  Administered 2023-01-14: 10 mg via INTRAVENOUS
  Filled 2023-01-14: qty 1

## 2023-01-14 MED ORDER — LABETALOL HCL 5 MG/ML IV SOLN
10.0000 mg | Freq: Once | INTRAVENOUS | Status: AC
  Administered 2023-01-14: 10 mg via INTRAVENOUS
  Filled 2023-01-14: qty 4

## 2023-01-14 MED ORDER — NIFEDIPINE ER OSMOTIC RELEASE 90 MG PO TB24: 90.0000 mg | ORAL_TABLET | Freq: Every day | ORAL | 1 refills | Status: DC

## 2023-01-14 MED ORDER — AMLODIPINE BESYLATE 5 MG PO TABS: 10.0000 mg | ORAL_TABLET | Freq: Once | ORAL | Status: DC

## 2023-01-14 MED ORDER — CARVEDILOL 25 MG PO TABS: 25.0000 mg | ORAL_TABLET | Freq: Two times a day (BID) | ORAL | 1 refills | Status: DC

## 2023-01-14 MED ORDER — CLONIDINE HCL 0.1 MG PO TABS: 0.1000 mg | ORAL_TABLET | Freq: Once | ORAL | Status: DC

## 2023-01-14 MED ORDER — CARVEDILOL 12.5 MG PO TABS: 25.0000 mg | ORAL_TABLET | Freq: Two times a day (BID) | ORAL | Status: DC

## 2023-01-14 NOTE — ED Notes (Signed)
Pt c/o itching around hemodialysis catheter. Airway patent, lung sounds clear, no distress noted at this time.

## 2023-01-14 NOTE — ED Provider Notes (Signed)
Accepted handoff at shift change from [facility]. Please see prior provider note for more detail.   Briefly: Patient is 52 y.o.   DDX: concern for Renal failure, dialysis patient, altered for around 2 days. Poorly responsive, not doing neurologic testing. Unsure how long he's been off. Baseline is functional, walk, talks, interacts. Plain CT unremarkable. Has been very hypertensive. Hypertensive encephalopathy? Neuro exam now normal, intermittently aggravated patient that does not want to be here. Non compliant on blood pressure medicines.   Blood pressure not significantly improved after hydralazine, labetalol, new blood pressure 198/102, however his mental status is back to his baseline per family.  Plan: Patient was pending MRI at time of handoff, I independently interpreted an MRI which shows chronic infarcts, as well as a cerebellar hematoma which is decreased in size from previous, patient otherwise has returned to his normal mental status, family agrees that he is back to his baseline, patient is requesting discharge at this time.  He denies any chest pain, vision changes.  I think is reasonable to discharge at this time, he had signs and symptoms which seem suspicious for hypertensive encephalopathy.  I have refilled his home blood pressure medications, of note his most recent dose for nifedipine was to take 180 mg daily, I do not see a clearly documented reason why and after consultation with our pharmacist Delmar Landau we decided to go back to his January dose of nifedipine which seems more reasonable until it is clearly documented why he is post to be taking such a greater than recommended dose of this medication.  Patient is comfortable with discharge at this time, understands to take his blood pressure medications and will return to dialysis at his neck scheduled appointment.      RISR  EDTHIS    [place] 01/14/23 1659    Linwood Dibbles, MD 01/16/23  1011

## 2023-01-14 NOTE — ED Notes (Signed)
Pt's mother and girlfriend at bedside.

## 2023-01-14 NOTE — ED Triage Notes (Signed)
Pt arrives via [facility] from dialysis for AMS. Family reports worsening AMS x2-3 days, baseline GCS 15 and ambulatory. GCS 12 in triage, pt minimally responsive to verbal stimuli. Per family, pt has been non-compliment with Nifedipine and Carvedilol. BP 205/106 in triage.   EMS reports baseline slurred speech from hemorrhagic stroke in January.

## 2023-01-14 NOTE — Discharge Instructions (Signed)
Please follow-up with your primary care doctor, and other specialists especially regarding your blood pressure medications, it was not clearly documented why you are blood pressure medication had been changed to nifedipine 180 mg daily, and after discussion with our pharmacist I do think that this is likely too high of a medication dosage, I would recommend in the short-term going back to the dosage you are on in January 90 mg which is what I am refilling.  If they do certifiable want you on 180 mg please clarify with your doctors. For now take as directed.

## 2023-01-14 NOTE — ED Provider Notes (Signed)
[place] [facility] [place] Provider [facility]   CSN: [id number] Arrival date & time: 01/14/23  1019     History  Chief Complaint  Patient presents with   Altered Mental Status    Randy Ross is a 52 y.o. male.  [facility] staff at [facility] center felt like he was altered.  Patient's family reports that patient has been sleeping more and less responsive for the past 2 days.  Patient has been out of some of his blood pressure medicines and his blood pressure has been high.  Family reports patient has not been talking today.   The history is provided by the patient. No language interpreter was used.  Altered Mental Status Presenting symptoms: partial responsiveness   Severity:  Moderate Most recent episode:  2 days ago Timing:  Constant Context: not recent illness and not recent infection   Associated symptoms: no vomiting        Home Medications Prior to Admission medications   Medication Sig Start Date End Date Taking? Authorizing Provider  acetaminophen (TYLENOL) 325 MG tablet Take 2 tablets (650 mg total) by mouth every 6 (six) hours as needed for moderate pain. Patient taking differently: Take 650 mg by mouth as needed for moderate pain or headache. 05/19/22   Wallis BambergMani, Mario, PA-C  amLODipine (NORVASC) 10 MG tablet Take 10 mg by mouth at bedtime. 04/23/22   [provider]  benzonatate (TESSALON) 100 MG capsule Take 1 capsule (100 mg total) by mouth 2 (two) times daily as needed for cough. 10/14/22   Rai, Delene Ruffiniipudeep K, MD  calcitRIOL (ROCALTROL) 0.25 MCG capsule Take 1 capsule (0.25 mcg total) by mouth daily. 12/16/21   Elgergawy, Leana Roeawood S, MD  carvedilol (COREG) 25 MG tablet Take 1 tablet (25 mg total) by mouth 2 (two) times daily. 10/14/22   Rai, Ripudeep K, MD  cloNIDine (CATAPRES) 0.1 MG tablet Take 1 tablet (0.1 mg total) by mouth 3 (three) times daily. 10/14/22   Rai, Delene Ruffiniipudeep K, MD   diphenhydrAMINE-zinc acetate (BENADRYL) cream Apply topically 3 (three) times daily. Apply to itchy areas on the legs 10/14/22   Rai, Ripudeep K, MD  Emollient (CETAPHIL) cream Apply 1 application  topically daily as needed (itching).    [provider]  hydrALAZINE (APRESOLINE) 100 MG tablet Take 1 tablet (100 mg total) by mouth every 8 (eight) hours. 10/01/22   Leatha GildingGherghe, Costin M, MD  multivitamin (RENA-VIT) TABS tablet Take 1 tablet by mouth at bedtime. 12/15/21   Elgergawy, Leana Roeawood S, MD  oxyCODONE-acetaminophen (PERCOCET) 5-325 MG tablet Take 1 tablet by mouth every 4 (four) hours as needed for severe pain. 11/02/22 11/02/23  Chuck Hintickson, Christopher S, MD  QUEtiapine (SEROQUEL) 25 MG tablet Take 1 tablet (25 mg total) by mouth at bedtime. 10/01/22   Gherghe, Daylene Katayamaostin M, MD  SEMGLEE, YFGN, 100 UNIT/ML Pen Inject 10 Units into the skin daily. 04/23/22   [provider]  sevelamer carbonate (RENVELA) 800 MG tablet Take 2 tablets (1,600 mg total) by mouth 3 (three) times daily with meals. 10/14/22   Rai, Delene Ruffiniipudeep K, MD  tetrahydrozoline-zinc (VISINE-AC) 0.05-0.25 % ophthalmic solution Place 1 drop into both eyes 3 (three) times daily as needed (dry eyes).    [provider]      Allergies    Gabapentin and Metformin and related    Review of Systems   Review of Systems  Gastrointestinal:  Negative for vomiting.  All  other systems reviewed and are negative.   Physical Exam Updated Vital Signs BP (!) 230/105   Pulse 77   Temp 98.1 F (36.7 C) (Oral)   Resp (!) 25   Ht 5\' 5"  (1.651 m)   Wt 77.5 kg   SpO2 97%   BMI 28.43 kg/m  Physical Exam Vitals and nursing note reviewed.  Constitutional:      Appearance: He is well-developed.     Comments: Sleepy, arouses to pain, goes back to sleep,   HENT:     Head: Normocephalic.     Right Ear: External ear normal.     Left Ear: External ear normal.     Mouth/Throat:     Mouth: Mucous membranes are moist.  Eyes:      Extraocular Movements: Extraocular movements intact.     Pupils: Pupils are equal, round, and reactive to light.  Cardiovascular:     Rate and Rhythm: Normal rate and regular rhythm.  Pulmonary:     Effort: Pulmonary effort is normal.  Abdominal:     General: Abdomen is flat. There is no distension.  Musculoskeletal:        General: Normal range of motion.     Cervical back: Normal range of motion.     Comments: Pt moves all extremities , Pt turns in bed   Skin:    General: Skin is warm.  Neurological:     Comments: Pt will not answer questions       ED Results / Procedures / Treatments   Labs (all labs ordered are listed, but only abnormal results are displayed) Labs Reviewed  COMPREHENSIVE METABOLIC PANEL - Abnormal; Notable for the following components:      Result Value   Chloride 93 (*)    Glucose, Bld 100 (*)    Creatinine, Ser 5.01 (*)    Calcium 8.4 (*)    Total Protein 8.4 (*)    AST 13 (*)    GFR, Estimated 13 (*)    All other components within normal limits  CBC - Abnormal; Notable for the following components:   WBC 10.6 (*)    Hemoglobin 9.4 (*)    HCT 30.1 (*)    MCV 63.8 (*)    MCH 19.9 (*)    RDW 22.3 (*)    All other components within normal limits  AMMONIA  URINALYSIS, ROUTINE W REFLEX MICROSCOPIC  CBG MONITORING, ED    EKG EKG Interpretation  Date/Time:  Friday January 14 2023 10:24:06 EDT Ventricular Rate:  82 PR Interval:  159 QRS Duration: 94 QT Interval:  440 QTC Calculation: 514 R Axis:   40 Text Interpretation: Sinus rhythm Minimal ST depression, lateral leads Prolonged QT interval Confirmed by Vanetta MuldersZackowski, Scott [phone] on 01/14/2023 10:30:04 AM  Radiology No results found.  Procedures Procedures    Medications Ordered in ED Medications  cloNIDine (CATAPRES) tablet 0.1 mg (has no administration in time range)  amLODipine (NORVASC) tablet 10 mg (has no administration in time range)  carvedilol (COREG) tablet 25 mg (has no  administration in time range)  hydrALAZINE (APRESOLINE) injection 10 mg (10 mg Intravenous Given 01/14/23 1107)    ED Course/ Medical Decision Making/ A&P Clinical Course as of 01/14/23 1508  Fri Jan 14, 2023  1506 Renal failure, dialysis patient, altered for around 2 days. Poorly responsive, not doing neurologic testing. Unsure how long he's been off. Baseline is functional, walk, talks, interacts. Plain CT unremarkable. Has been very hypertensive. Hypertensive encephalopathy? Neuro exam  now aggravated patient that does not want to be here. Non compliant on blood pressure medicines.  [CP]    Clinical Course User Index [CP] Olene Floss, PA-C                             Medical Decision Making Patient was sent to the emergency department for evaluation after dialysis today.  Patient was reported to be less responsive than usual.  Patient's family reports patient has been less talkative and sleepy for the past 2 days.  Patient's family members report he has not been taking his blood pressure medicines  Amount and/or Complexity of Data Reviewed External Data Reviewed: notes.    Details: Previous notes reviewed patient had a CVA in February Labs: ordered. Decision-making details documented in ED Course.    Details: Labs ordered reviewed and interpreted patient's hemoglobin is 9.4 BUN is normal creatinine is 5.01 Radiology: ordered and independent interpretation performed. Decision-making details documented in ED Course.    Details: CT head shows chronic changes no acute infarct  Risk Prescription drug management. Risk Details: Patient remains hypertensive he has received hydralazine IV labetalol 5 mg IV.  I discussed the patient's blood pressure with pharmacy who advised giving him 10 mg of labetalol.  Patient is awake he currently denies any complaints.  I have ordered an MRI of patient's brain to assess him for possible new CVA.   Pt's care turned over to Luther Hearing PA          Final Clinical Impression(s) / ED Diagnoses Final diagnoses:  Altered mental status, unspecified altered mental status type  Secondary hypertension    Rx / DC Orders ED Discharge Orders     None         Osie Cheeks 01/14/23 1511    Vanetta Mulders, MD 01/16/23 [phone]

## 2023-01-14 NOTE — ED Notes (Signed)
Provider notified of failed swallow screen.

## 2023-01-14 NOTE — ED Notes (Signed)
Pt more awake, interactive, sitting upright in bed, scooting towards foot of stretcher, repositioning self, agitated, frustrated with wait, states, "wants to go home, get out of hear, "F" this shit", EDPA updated, family returns to [facility], family encouraging pt to stay. BP med given. Will monitor.

## 2023-01-14 NOTE — ED Notes (Signed)
Lab contacted. Confirmed receipt of green tube on ice for pending ammonia level.

## 2023-01-14 NOTE — Progress Notes (Signed)
SLP Cancellation Note  Patient Details Name: Randy Ross MRN: [id number] DOB: 1971-05-03   Cancelled treatment:       Reason Eval/Treat Not Completed: Patient not medically ready. Pt failed [facility] due to poorly responsive. He does also have a history of dysphagia and SLP assessment needed for diet initiation, but pt not yet appropriate. Will f/u tomorrow.    Brit Wernette, Riley Nearing 01/14/2023, 11:46 AM

## 2023-01-14 NOTE — ED Notes (Signed)
Provider notified of BP.

## 2023-01-14 NOTE — ED Notes (Signed)
ED PA at BS 

## 2023-01-18 DIAGNOSIS — I120 Hypertensive chronic kidney disease with stage 5 chronic kidney disease or end stage renal disease: Secondary | ICD-10-CM | POA: Diagnosis not present

## 2023-01-18 DIAGNOSIS — E1165 Type 2 diabetes mellitus with hyperglycemia: Secondary | ICD-10-CM | POA: Diagnosis not present

## 2023-01-18 DIAGNOSIS — Z794 Long term (current) use of insulin: Secondary | ICD-10-CM | POA: Diagnosis not present

## 2023-01-18 DIAGNOSIS — G936 Cerebral edema: Secondary | ICD-10-CM | POA: Diagnosis not present

## 2023-01-18 DIAGNOSIS — D631 Anemia in chronic kidney disease: Secondary | ICD-10-CM | POA: Diagnosis not present

## 2023-01-18 DIAGNOSIS — N186 End stage renal disease: Secondary | ICD-10-CM | POA: Diagnosis not present

## 2023-01-18 DIAGNOSIS — G4720 Circadian rhythm sleep disorder, unspecified type: Secondary | ICD-10-CM | POA: Diagnosis not present

## 2023-01-18 DIAGNOSIS — I69254 Hemiplegia and hemiparesis following other nontraumatic intracranial hemorrhage affecting left non-dominant side: Secondary | ICD-10-CM | POA: Diagnosis not present

## 2023-01-18 DIAGNOSIS — I69291 Dysphagia following other nontraumatic intracranial hemorrhage: Secondary | ICD-10-CM | POA: Diagnosis not present

## 2023-01-18 DIAGNOSIS — E211 Secondary hyperparathyroidism, not elsewhere classified: Secondary | ICD-10-CM | POA: Diagnosis not present

## 2023-01-18 DIAGNOSIS — Z992 Dependence on renal dialysis: Secondary | ICD-10-CM | POA: Diagnosis not present

## 2023-01-18 DIAGNOSIS — N2581 Secondary hyperparathyroidism of renal origin: Secondary | ICD-10-CM | POA: Diagnosis not present

## 2023-01-18 DIAGNOSIS — Z8719 Personal history of other diseases of the digestive system: Secondary | ICD-10-CM | POA: Diagnosis not present

## 2023-01-18 DIAGNOSIS — E875 Hyperkalemia: Secondary | ICD-10-CM | POA: Diagnosis not present

## 2023-01-18 DIAGNOSIS — E1122 Type 2 diabetes mellitus with diabetic chronic kidney disease: Secondary | ICD-10-CM | POA: Diagnosis not present

## 2023-01-25 DIAGNOSIS — E875 Hyperkalemia: Secondary | ICD-10-CM | POA: Diagnosis not present

## 2023-01-25 DIAGNOSIS — Z794 Long term (current) use of insulin: Secondary | ICD-10-CM | POA: Diagnosis not present

## 2023-01-25 DIAGNOSIS — E211 Secondary hyperparathyroidism, not elsewhere classified: Secondary | ICD-10-CM | POA: Diagnosis not present

## 2023-01-25 DIAGNOSIS — N186 End stage renal disease: Secondary | ICD-10-CM | POA: Diagnosis not present

## 2023-01-25 DIAGNOSIS — Z8719 Personal history of other diseases of the digestive system: Secondary | ICD-10-CM | POA: Diagnosis not present

## 2023-01-25 DIAGNOSIS — I69291 Dysphagia following other nontraumatic intracranial hemorrhage: Secondary | ICD-10-CM | POA: Diagnosis not present

## 2023-01-25 DIAGNOSIS — D631 Anemia in chronic kidney disease: Secondary | ICD-10-CM | POA: Diagnosis not present

## 2023-01-25 DIAGNOSIS — G4720 Circadian rhythm sleep disorder, unspecified type: Secondary | ICD-10-CM | POA: Diagnosis not present

## 2023-01-25 DIAGNOSIS — I69254 Hemiplegia and hemiparesis following other nontraumatic intracranial hemorrhage affecting left non-dominant side: Secondary | ICD-10-CM | POA: Diagnosis not present

## 2023-01-25 DIAGNOSIS — N2581 Secondary hyperparathyroidism of renal origin: Secondary | ICD-10-CM | POA: Diagnosis not present

## 2023-01-25 DIAGNOSIS — Z452 Encounter for adjustment and management of vascular access device: Secondary | ICD-10-CM | POA: Diagnosis not present

## 2023-01-25 DIAGNOSIS — E1165 Type 2 diabetes mellitus with hyperglycemia: Secondary | ICD-10-CM | POA: Diagnosis not present

## 2023-01-25 DIAGNOSIS — Z992 Dependence on renal dialysis: Secondary | ICD-10-CM | POA: Diagnosis not present

## 2023-01-25 DIAGNOSIS — G936 Cerebral edema: Secondary | ICD-10-CM | POA: Diagnosis not present

## 2023-01-25 DIAGNOSIS — I120 Hypertensive chronic kidney disease with stage 5 chronic kidney disease or end stage renal disease: Secondary | ICD-10-CM | POA: Diagnosis not present

## 2023-01-25 DIAGNOSIS — E1122 Type 2 diabetes mellitus with diabetic chronic kidney disease: Secondary | ICD-10-CM | POA: Diagnosis not present

## 2023-02-01 DIAGNOSIS — N2581 Secondary hyperparathyroidism of renal origin: Secondary | ICD-10-CM | POA: Diagnosis not present

## 2023-02-01 DIAGNOSIS — E785 Hyperlipidemia, unspecified: Secondary | ICD-10-CM | POA: Diagnosis not present

## 2023-02-01 DIAGNOSIS — E1122 Type 2 diabetes mellitus with diabetic chronic kidney disease: Secondary | ICD-10-CM | POA: Diagnosis not present

## 2023-02-01 DIAGNOSIS — I69322 Dysarthria following cerebral infarction: Secondary | ICD-10-CM | POA: Diagnosis not present

## 2023-02-01 DIAGNOSIS — G4720 Circadian rhythm sleep disorder, unspecified type: Secondary | ICD-10-CM | POA: Diagnosis not present

## 2023-02-01 DIAGNOSIS — I120 Hypertensive chronic kidney disease with stage 5 chronic kidney disease or end stage renal disease: Secondary | ICD-10-CM | POA: Diagnosis not present

## 2023-02-01 DIAGNOSIS — I69318 Other symptoms and signs involving cognitive functions following cerebral infarction: Secondary | ICD-10-CM | POA: Diagnosis not present

## 2023-02-01 DIAGNOSIS — Z8719 Personal history of other diseases of the digestive system: Secondary | ICD-10-CM | POA: Diagnosis not present

## 2023-02-01 DIAGNOSIS — Z992 Dependence on renal dialysis: Secondary | ICD-10-CM | POA: Diagnosis not present

## 2023-02-01 DIAGNOSIS — D631 Anemia in chronic kidney disease: Secondary | ICD-10-CM | POA: Diagnosis not present

## 2023-02-01 DIAGNOSIS — I69291 Dysphagia following other nontraumatic intracranial hemorrhage: Secondary | ICD-10-CM | POA: Diagnosis not present

## 2023-02-01 DIAGNOSIS — N186 End stage renal disease: Secondary | ICD-10-CM | POA: Diagnosis not present

## 2023-02-01 DIAGNOSIS — E211 Secondary hyperparathyroidism, not elsewhere classified: Secondary | ICD-10-CM | POA: Diagnosis not present

## 2023-02-01 DIAGNOSIS — I69254 Hemiplegia and hemiparesis following other nontraumatic intracranial hemorrhage affecting left non-dominant side: Secondary | ICD-10-CM | POA: Diagnosis not present

## 2023-02-01 DIAGNOSIS — G936 Cerebral edema: Secondary | ICD-10-CM | POA: Diagnosis not present

## 2023-02-01 DIAGNOSIS — Z794 Long term (current) use of insulin: Secondary | ICD-10-CM | POA: Diagnosis not present

## 2023-02-08 DIAGNOSIS — E1122 Type 2 diabetes mellitus with diabetic chronic kidney disease: Secondary | ICD-10-CM | POA: Diagnosis not present

## 2023-02-08 DIAGNOSIS — I69322 Dysarthria following cerebral infarction: Secondary | ICD-10-CM | POA: Diagnosis not present

## 2023-02-08 DIAGNOSIS — I69318 Other symptoms and signs involving cognitive functions following cerebral infarction: Secondary | ICD-10-CM | POA: Diagnosis not present

## 2023-02-08 DIAGNOSIS — N2581 Secondary hyperparathyroidism of renal origin: Secondary | ICD-10-CM | POA: Diagnosis not present

## 2023-02-08 DIAGNOSIS — G4720 Circadian rhythm sleep disorder, unspecified type: Secondary | ICD-10-CM | POA: Diagnosis not present

## 2023-02-08 DIAGNOSIS — N186 End stage renal disease: Secondary | ICD-10-CM | POA: Diagnosis not present

## 2023-02-08 DIAGNOSIS — I120 Hypertensive chronic kidney disease with stage 5 chronic kidney disease or end stage renal disease: Secondary | ICD-10-CM | POA: Diagnosis not present

## 2023-02-08 DIAGNOSIS — G936 Cerebral edema: Secondary | ICD-10-CM | POA: Diagnosis not present

## 2023-02-08 DIAGNOSIS — I69254 Hemiplegia and hemiparesis following other nontraumatic intracranial hemorrhage affecting left non-dominant side: Secondary | ICD-10-CM | POA: Diagnosis not present

## 2023-02-08 DIAGNOSIS — E785 Hyperlipidemia, unspecified: Secondary | ICD-10-CM | POA: Diagnosis not present

## 2023-02-08 DIAGNOSIS — Z8719 Personal history of other diseases of the digestive system: Secondary | ICD-10-CM | POA: Diagnosis not present

## 2023-02-08 DIAGNOSIS — E211 Secondary hyperparathyroidism, not elsewhere classified: Secondary | ICD-10-CM | POA: Diagnosis not present

## 2023-02-08 DIAGNOSIS — I69291 Dysphagia following other nontraumatic intracranial hemorrhage: Secondary | ICD-10-CM | POA: Diagnosis not present

## 2023-02-08 DIAGNOSIS — D631 Anemia in chronic kidney disease: Secondary | ICD-10-CM | POA: Diagnosis not present

## 2023-02-08 DIAGNOSIS — Z992 Dependence on renal dialysis: Secondary | ICD-10-CM | POA: Diagnosis not present

## 2023-02-08 DIAGNOSIS — Z794 Long term (current) use of insulin: Secondary | ICD-10-CM | POA: Diagnosis not present

## 2023-02-15 DIAGNOSIS — Z992 Dependence on renal dialysis: Secondary | ICD-10-CM | POA: Diagnosis not present

## 2023-02-15 DIAGNOSIS — I69318 Other symptoms and signs involving cognitive functions following cerebral infarction: Secondary | ICD-10-CM | POA: Diagnosis not present

## 2023-02-15 DIAGNOSIS — E785 Hyperlipidemia, unspecified: Secondary | ICD-10-CM | POA: Diagnosis not present

## 2023-02-15 DIAGNOSIS — E211 Secondary hyperparathyroidism, not elsewhere classified: Secondary | ICD-10-CM | POA: Diagnosis not present

## 2023-02-15 DIAGNOSIS — G936 Cerebral edema: Secondary | ICD-10-CM | POA: Diagnosis not present

## 2023-02-15 DIAGNOSIS — Z794 Long term (current) use of insulin: Secondary | ICD-10-CM | POA: Diagnosis not present

## 2023-02-15 DIAGNOSIS — N2581 Secondary hyperparathyroidism of renal origin: Secondary | ICD-10-CM | POA: Diagnosis not present

## 2023-02-15 DIAGNOSIS — I69322 Dysarthria following cerebral infarction: Secondary | ICD-10-CM | POA: Diagnosis not present

## 2023-02-15 DIAGNOSIS — D631 Anemia in chronic kidney disease: Secondary | ICD-10-CM | POA: Diagnosis not present

## 2023-02-15 DIAGNOSIS — N186 End stage renal disease: Secondary | ICD-10-CM | POA: Diagnosis not present

## 2023-02-15 DIAGNOSIS — Z8719 Personal history of other diseases of the digestive system: Secondary | ICD-10-CM | POA: Diagnosis not present

## 2023-02-15 DIAGNOSIS — G4720 Circadian rhythm sleep disorder, unspecified type: Secondary | ICD-10-CM | POA: Diagnosis not present

## 2023-02-15 DIAGNOSIS — E1122 Type 2 diabetes mellitus with diabetic chronic kidney disease: Secondary | ICD-10-CM | POA: Diagnosis not present

## 2023-02-15 DIAGNOSIS — I69254 Hemiplegia and hemiparesis following other nontraumatic intracranial hemorrhage affecting left non-dominant side: Secondary | ICD-10-CM | POA: Diagnosis not present

## 2023-02-15 DIAGNOSIS — I69291 Dysphagia following other nontraumatic intracranial hemorrhage: Secondary | ICD-10-CM | POA: Diagnosis not present

## 2023-02-15 DIAGNOSIS — I120 Hypertensive chronic kidney disease with stage 5 chronic kidney disease or end stage renal disease: Secondary | ICD-10-CM | POA: Diagnosis not present

## 2023-02-23 ENCOUNTER — Ambulatory Visit: Payer: 59 | Admitting: Speech Pathology

## 2023-02-24 ENCOUNTER — Ambulatory Visit: Payer: 59 | Attending: Family Medicine | Admitting: Speech Pathology

## 2023-02-28 DIAGNOSIS — D472 Monoclonal gammopathy: Secondary | ICD-10-CM | POA: Diagnosis not present

## 2023-02-28 DIAGNOSIS — N186 End stage renal disease: Secondary | ICD-10-CM | POA: Diagnosis not present

## 2023-02-28 DIAGNOSIS — D689 Coagulation defect, unspecified: Secondary | ICD-10-CM | POA: Diagnosis not present

## 2023-02-28 DIAGNOSIS — L299 Pruritus, unspecified: Secondary | ICD-10-CM | POA: Diagnosis not present

## 2023-02-28 DIAGNOSIS — Z992 Dependence on renal dialysis: Secondary | ICD-10-CM | POA: Diagnosis not present

## 2023-02-28 DIAGNOSIS — N2581 Secondary hyperparathyroidism of renal origin: Secondary | ICD-10-CM | POA: Diagnosis not present

## 2023-03-02 DIAGNOSIS — Z992 Dependence on renal dialysis: Secondary | ICD-10-CM | POA: Diagnosis not present

## 2023-03-02 DIAGNOSIS — D472 Monoclonal gammopathy: Secondary | ICD-10-CM | POA: Diagnosis not present

## 2023-03-02 DIAGNOSIS — N186 End stage renal disease: Secondary | ICD-10-CM | POA: Diagnosis not present

## 2023-03-02 DIAGNOSIS — L299 Pruritus, unspecified: Secondary | ICD-10-CM | POA: Diagnosis not present

## 2023-03-02 DIAGNOSIS — D689 Coagulation defect, unspecified: Secondary | ICD-10-CM | POA: Diagnosis not present

## 2023-03-02 DIAGNOSIS — N2581 Secondary hyperparathyroidism of renal origin: Secondary | ICD-10-CM | POA: Diagnosis not present

## 2023-03-04 DIAGNOSIS — N2581 Secondary hyperparathyroidism of renal origin: Secondary | ICD-10-CM | POA: Diagnosis not present

## 2023-03-04 DIAGNOSIS — N186 End stage renal disease: Secondary | ICD-10-CM | POA: Diagnosis not present

## 2023-03-04 DIAGNOSIS — L299 Pruritus, unspecified: Secondary | ICD-10-CM | POA: Diagnosis not present

## 2023-03-04 DIAGNOSIS — Z992 Dependence on renal dialysis: Secondary | ICD-10-CM | POA: Diagnosis not present

## 2023-03-04 DIAGNOSIS — D689 Coagulation defect, unspecified: Secondary | ICD-10-CM | POA: Diagnosis not present

## 2023-03-04 DIAGNOSIS — D472 Monoclonal gammopathy: Secondary | ICD-10-CM | POA: Diagnosis not present

## 2023-03-07 DIAGNOSIS — D472 Monoclonal gammopathy: Secondary | ICD-10-CM | POA: Diagnosis not present

## 2023-03-07 DIAGNOSIS — L299 Pruritus, unspecified: Secondary | ICD-10-CM | POA: Diagnosis not present

## 2023-03-07 DIAGNOSIS — N2581 Secondary hyperparathyroidism of renal origin: Secondary | ICD-10-CM | POA: Diagnosis not present

## 2023-03-07 DIAGNOSIS — Z992 Dependence on renal dialysis: Secondary | ICD-10-CM | POA: Diagnosis not present

## 2023-03-07 DIAGNOSIS — D689 Coagulation defect, unspecified: Secondary | ICD-10-CM | POA: Diagnosis not present

## 2023-03-07 DIAGNOSIS — N186 End stage renal disease: Secondary | ICD-10-CM | POA: Diagnosis not present

## 2023-03-08 ENCOUNTER — Emergency Department (HOSPITAL_COMMUNITY): Payer: 59

## 2023-03-08 ENCOUNTER — Inpatient Hospital Stay (HOSPITAL_COMMUNITY): Payer: 59

## 2023-03-08 ENCOUNTER — Encounter (HOSPITAL_COMMUNITY): Payer: Self-pay | Admitting: Student in an Organized Health Care Education/Training Program

## 2023-03-08 ENCOUNTER — Inpatient Hospital Stay (HOSPITAL_COMMUNITY)
Admission: EM | Admit: 2023-03-08 | Discharge: 2023-03-12 | DRG: 064 | Disposition: E | Payer: 59 | Attending: Student in an Organized Health Care Education/Training Program | Admitting: Student in an Organized Health Care Education/Training Program

## 2023-03-08 DIAGNOSIS — I10 Essential (primary) hypertension: Secondary | ICD-10-CM | POA: Diagnosis not present

## 2023-03-08 DIAGNOSIS — D631 Anemia in chronic kidney disease: Secondary | ICD-10-CM | POA: Diagnosis present

## 2023-03-08 DIAGNOSIS — I120 Hypertensive chronic kidney disease with stage 5 chronic kidney disease or end stage renal disease: Secondary | ICD-10-CM | POA: Diagnosis present

## 2023-03-08 DIAGNOSIS — Z992 Dependence on renal dialysis: Secondary | ICD-10-CM

## 2023-03-08 DIAGNOSIS — G936 Cerebral edema: Secondary | ICD-10-CM | POA: Diagnosis not present

## 2023-03-08 DIAGNOSIS — N186 End stage renal disease: Secondary | ICD-10-CM | POA: Diagnosis present

## 2023-03-08 DIAGNOSIS — R29720 NIHSS score 20: Secondary | ICD-10-CM | POA: Diagnosis present

## 2023-03-08 DIAGNOSIS — I161 Hypertensive emergency: Secondary | ICD-10-CM | POA: Diagnosis present

## 2023-03-08 DIAGNOSIS — I615 Nontraumatic intracerebral hemorrhage, intraventricular: Secondary | ICD-10-CM | POA: Diagnosis present

## 2023-03-08 DIAGNOSIS — N250 Renal osteodystrophy: Secondary | ICD-10-CM | POA: Diagnosis present

## 2023-03-08 DIAGNOSIS — F17210 Nicotine dependence, cigarettes, uncomplicated: Secondary | ICD-10-CM | POA: Diagnosis present

## 2023-03-08 DIAGNOSIS — I618 Other nontraumatic intracerebral hemorrhage: Secondary | ICD-10-CM | POA: Diagnosis present

## 2023-03-08 DIAGNOSIS — G8104 Flaccid hemiplegia affecting left nondominant side: Secondary | ICD-10-CM | POA: Diagnosis present

## 2023-03-08 DIAGNOSIS — Z8673 Personal history of transient ischemic attack (TIA), and cerebral infarction without residual deficits: Secondary | ICD-10-CM

## 2023-03-08 DIAGNOSIS — Z515 Encounter for palliative care: Secondary | ICD-10-CM

## 2023-03-08 DIAGNOSIS — R404 Transient alteration of awareness: Secondary | ICD-10-CM | POA: Diagnosis not present

## 2023-03-08 DIAGNOSIS — Z888 Allergy status to other drugs, medicaments and biological substances status: Secondary | ICD-10-CM

## 2023-03-08 DIAGNOSIS — E1122 Type 2 diabetes mellitus with diabetic chronic kidney disease: Secondary | ICD-10-CM | POA: Diagnosis present

## 2023-03-08 DIAGNOSIS — I6389 Other cerebral infarction: Secondary | ICD-10-CM

## 2023-03-08 DIAGNOSIS — Z79899 Other long term (current) drug therapy: Secondary | ICD-10-CM

## 2023-03-08 DIAGNOSIS — T17908A Unspecified foreign body in respiratory tract, part unspecified causing other injury, initial encounter: Secondary | ICD-10-CM | POA: Diagnosis not present

## 2023-03-08 DIAGNOSIS — E1165 Type 2 diabetes mellitus with hyperglycemia: Secondary | ICD-10-CM | POA: Diagnosis present

## 2023-03-08 DIAGNOSIS — I610 Nontraumatic intracerebral hemorrhage in hemisphere, subcortical: Principal | ICD-10-CM | POA: Diagnosis present

## 2023-03-08 DIAGNOSIS — R680 Hypothermia, not associated with low environmental temperature: Secondary | ICD-10-CM | POA: Diagnosis not present

## 2023-03-08 DIAGNOSIS — Z4682 Encounter for fitting and adjustment of non-vascular catheter: Secondary | ICD-10-CM | POA: Diagnosis not present

## 2023-03-08 DIAGNOSIS — R4781 Slurred speech: Secondary | ICD-10-CM | POA: Diagnosis present

## 2023-03-08 DIAGNOSIS — J9601 Acute respiratory failure with hypoxia: Secondary | ICD-10-CM | POA: Diagnosis present

## 2023-03-08 DIAGNOSIS — G911 Obstructive hydrocephalus: Secondary | ICD-10-CM | POA: Diagnosis present

## 2023-03-08 DIAGNOSIS — Z7189 Other specified counseling: Secondary | ICD-10-CM | POA: Diagnosis not present

## 2023-03-08 DIAGNOSIS — G4489 Other headache syndrome: Secondary | ICD-10-CM | POA: Diagnosis not present

## 2023-03-08 DIAGNOSIS — R55 Syncope and collapse: Secondary | ICD-10-CM | POA: Diagnosis not present

## 2023-03-08 DIAGNOSIS — Z66 Do not resuscitate: Secondary | ICD-10-CM | POA: Diagnosis not present

## 2023-03-08 DIAGNOSIS — R29818 Other symptoms and signs involving the nervous system: Secondary | ICD-10-CM | POA: Diagnosis not present

## 2023-03-08 DIAGNOSIS — G935 Compression of brain: Secondary | ICD-10-CM | POA: Diagnosis not present

## 2023-03-08 DIAGNOSIS — E875 Hyperkalemia: Secondary | ICD-10-CM | POA: Diagnosis present

## 2023-03-08 DIAGNOSIS — J690 Pneumonitis due to inhalation of food and vomit: Secondary | ICD-10-CM | POA: Diagnosis present

## 2023-03-08 DIAGNOSIS — Z9911 Dependence on respirator [ventilator] status: Secondary | ICD-10-CM | POA: Diagnosis not present

## 2023-03-08 DIAGNOSIS — R29725 NIHSS score 25: Secondary | ICD-10-CM | POA: Diagnosis present

## 2023-03-08 DIAGNOSIS — R1110 Vomiting, unspecified: Secondary | ICD-10-CM | POA: Diagnosis present

## 2023-03-08 DIAGNOSIS — R519 Headache, unspecified: Secondary | ICD-10-CM | POA: Diagnosis present

## 2023-03-08 DIAGNOSIS — I639 Cerebral infarction, unspecified: Secondary | ICD-10-CM | POA: Diagnosis not present

## 2023-03-08 DIAGNOSIS — I619 Nontraumatic intracerebral hemorrhage, unspecified: Principal | ICD-10-CM

## 2023-03-08 LAB — URINALYSIS, ROUTINE W REFLEX MICROSCOPIC
Bilirubin Urine: NEGATIVE
Glucose, UA: >=500 mg/dL — AB
Ketones, ur: NEGATIVE mg/dL
Leukocytes,Ua: NEGATIVE
Nitrite: NEGATIVE
Protein, ur: >=300 mg/dL — AB
RBC / HPF: >50 RBC/hpf (ref 0–5)
Specific Gravity, Urine: 1.009 (ref 1.005–1.030)
pH: 9 — ABNORMAL HIGH (ref 5.0–8.0)

## 2023-03-08 LAB — I-STAT ARTERIAL BLOOD GAS, ED
Acid-Base Excess: 10 mmol/L — ABNORMAL HIGH (ref 0.0–2.0)
Bicarbonate: 34.4 mmol/L — ABNORMAL HIGH (ref 20.0–28.0)
Calcium, Ion: 0.98 mmol/L — ABNORMAL LOW (ref 1.15–1.40)
HCT: 35 % — ABNORMAL LOW (ref 39.0–52.0)
Hemoglobin: 11.9 g/dL — ABNORMAL LOW (ref 13.0–17.0)
O2 Saturation: 100 %
Patient temperature: 98.6
Potassium: 4.2 mmol/L (ref 3.5–5.1)
Sodium: 146 mmol/L — ABNORMAL HIGH (ref 135–145)
TCO2: 36 mmol/L — ABNORMAL HIGH (ref 22–32)
pCO2 arterial: 45.6 mmHg (ref 32–48)
pH, Arterial: 7.485 — ABNORMAL HIGH (ref 7.35–7.45)
pO2, Arterial: 260 mmHg — ABNORMAL HIGH (ref 83–108)

## 2023-03-08 LAB — DIFFERENTIAL
Abs Immature Granulocytes: 0 10*3/uL (ref 0.00–0.07)
Basophils Absolute: 0.1 10*3/uL (ref 0.0–0.1)
Basophils Relative: 1 %
Eosinophils Absolute: 0.2 10*3/uL (ref 0.0–0.5)
Eosinophils Relative: 2 %
Immature Granulocytes: 0 %
Lymphocytes Relative: 19 %
Lymphs Abs: 2.1 10*3/uL (ref 0.7–4.0)
Monocytes Absolute: 0.8 10*3/uL (ref 0.1–1.0)
Monocytes Relative: 7 %
Neutro Abs: 7.8 10*3/uL — ABNORMAL HIGH (ref 1.7–7.7)
Neutrophils Relative %: 71 %
nRBC: 0 /100 WBC

## 2023-03-08 LAB — I-STAT CHEM 8, ED
BUN: 54 mg/dL — ABNORMAL HIGH (ref 6–20)
Calcium, Ion: 0.9 mmol/L — ABNORMAL LOW (ref 1.15–1.40)
Chloride: 99 mmol/L (ref 98–111)
Creatinine, Ser: 10.9 mg/dL — ABNORMAL HIGH (ref 0.61–1.24)
Glucose, Bld: 119 mg/dL — ABNORMAL HIGH (ref 70–99)
HCT: 38 % — ABNORMAL LOW (ref 39.0–52.0)
Hemoglobin: 12.9 g/dL — ABNORMAL LOW (ref 13.0–17.0)
Potassium: 4.8 mmol/L (ref 3.5–5.1)
Sodium: 144 mmol/L (ref 135–145)
TCO2: 34 mmol/L — ABNORMAL HIGH (ref 22–32)

## 2023-03-08 LAB — CBC
HCT: 36.4 % — ABNORMAL LOW (ref 39.0–52.0)
Hemoglobin: 11.3 g/dL — ABNORMAL LOW (ref 13.0–17.0)
MCH: 21.1 pg — ABNORMAL LOW (ref 26.0–34.0)
MCHC: 31 g/dL (ref 30.0–36.0)
MCV: 68 fL — ABNORMAL LOW (ref 80.0–100.0)
Platelets: 354 10*3/uL (ref 150–400)
RBC: 5.35 MIL/uL (ref 4.22–5.81)
RDW: 21.7 % — ABNORMAL HIGH (ref 11.5–15.5)
WBC: 11 10*3/uL — ABNORMAL HIGH (ref 4.0–10.5)
nRBC: 0 % (ref 0.0–0.2)

## 2023-03-08 LAB — ECHOCARDIOGRAM COMPLETE
AR max vel: 3.63 cm2
AV Area VTI: 3.82 cm2
AV Area mean vel: 3.9 cm2
AV Mean grad: 8.7 mmHg
AV Peak grad: 17.2 mmHg
Ao pk vel: 2.07 m/s
Area-P 1/2: 5.75 cm2
Height: 65 in
MV M vel: 1.32 m/s
MV Peak grad: 7 mmHg
S' Lateral: 2.9 cm
Weight: 2962.98 oz

## 2023-03-08 LAB — GLUCOSE, CAPILLARY
Glucose-Capillary: 251 mg/dL — ABNORMAL HIGH (ref 70–99)
Glucose-Capillary: 232 mg/dL — ABNORMAL HIGH (ref 70–99)
Glucose-Capillary: 99 mg/dL (ref 70–99)
Glucose-Capillary: 177 mg/dL — ABNORMAL HIGH (ref 70–99)
Glucose-Capillary: 136 mg/dL — ABNORMAL HIGH (ref 70–99)
Glucose-Capillary: 164 mg/dL — ABNORMAL HIGH (ref 70–99)

## 2023-03-08 LAB — RAPID URINE DRUG SCREEN, HOSP PERFORMED
Amphetamines: NOT DETECTED
Barbiturates: NOT DETECTED
Benzodiazepines: NOT DETECTED
Cocaine: NOT DETECTED
Opiates: NOT DETECTED
Tetrahydrocannabinol: NOT DETECTED

## 2023-03-08 LAB — COMPREHENSIVE METABOLIC PANEL
ALT: 27 U/L (ref 0–44)
AST: 19 U/L (ref 15–41)
Albumin: 3.5 g/dL (ref 3.5–5.0)
Alkaline Phosphatase: 83 U/L (ref 38–126)
Anion gap: 17 — ABNORMAL HIGH (ref 5–15)
BUN: 57 mg/dL — ABNORMAL HIGH (ref 6–20)
CO2: 30 mmol/L (ref 22–32)
Calcium: 7.8 mg/dL — ABNORMAL LOW (ref 8.9–10.3)
Chloride: 95 mmol/L — ABNORMAL LOW (ref 98–111)
Creatinine, Ser: 9.7 mg/dL — ABNORMAL HIGH (ref 0.61–1.24)
GFR, Estimated: 6 mL/min — ABNORMAL LOW (ref >60)
Glucose, Bld: 120 mg/dL — ABNORMAL HIGH (ref 70–99)
Potassium: 4.8 mmol/L (ref 3.5–5.1)
Sodium: 142 mmol/L (ref 135–145)
Total Bilirubin: 0.4 mg/dL (ref 0.3–1.2)
Total Protein: 7.6 g/dL (ref 6.5–8.1)

## 2023-03-08 LAB — CBG MONITORING, ED: Glucose-Capillary: 130 mg/dL — ABNORMAL HIGH (ref 70–99)

## 2023-03-08 LAB — SODIUM
Sodium: 168 mmol/L (ref 135–145)
Sodium: 148 mmol/L — ABNORMAL HIGH (ref 135–145)
Sodium: 147 mmol/L — ABNORMAL HIGH (ref 135–145)
Sodium: 147 mmol/L — ABNORMAL HIGH (ref 135–145)

## 2023-03-08 LAB — HEMOGLOBIN A1C
Hgb A1c MFr Bld: 5.3 % (ref 4.8–5.6)
Mean Plasma Glucose: 105 mg/dL

## 2023-03-08 LAB — ETHANOL: Alcohol, Ethyl (B): <10 mg/dL (ref <10)

## 2023-03-08 LAB — HIV ANTIBODY (ROUTINE TESTING W REFLEX): HIV Screen 4th Generation wRfx: NONREACTIVE

## 2023-03-08 LAB — PROTIME-INR
INR: 0.9 (ref 0.8–1.2)
Prothrombin Time: 12 seconds (ref 11.4–15.2)

## 2023-03-08 LAB — MRSA NEXT GEN BY PCR, NASAL: MRSA by PCR Next Gen: NOT DETECTED

## 2023-03-08 LAB — APTT: aPTT: 32 seconds (ref 24–36)

## 2023-03-08 MED ORDER — SODIUM CHLORIDE 3 % IV BOLUS
250.0000 mL | Freq: Once | INTRAVENOUS | Status: AC
  Administered 2023-03-08: 250 mL via INTRAVENOUS
  Filled 2023-03-08: qty 500

## 2023-03-08 MED ORDER — INSULIN ASPART 100 UNIT/ML IJ SOLN
0.0000 [IU] | Freq: Three times a day (TID) | INTRAMUSCULAR | Status: DC
  Administered 2023-03-08: 5 [IU] via SUBCUTANEOUS

## 2023-03-08 MED ORDER — PANTOPRAZOLE SODIUM 40 MG IV SOLR
40.0000 mg | Freq: Every day | INTRAVENOUS | Status: DC
  Administered 2023-03-08 (×2): 40 mg via INTRAVENOUS
  Filled 2023-03-08 (×2): qty 10

## 2023-03-08 MED ORDER — ORAL CARE MOUTH RINSE: 15.0000 mL | OROMUCOSAL | Status: DC | PRN

## 2023-03-08 MED ORDER — CLEVIDIPINE BUTYRATE 0.5 MG/ML IV EMUL
0.0000 mg/h | INTRAVENOUS | Status: DC
  Administered 2023-03-08: 12 mg/h via INTRAVENOUS
  Administered 2023-03-08 (×2): 14 mg/h via INTRAVENOUS
  Administered 2023-03-08: 2 mg/h via INTRAVENOUS
  Administered 2023-03-08: 20 mg/h via INTRAVENOUS
  Administered 2023-03-08: 16 mg/h via INTRAVENOUS
  Administered 2023-03-08 (×2): 18 mg/h via INTRAVENOUS
  Administered 2023-03-09: 16 mg/h via INTRAVENOUS
  Administered 2023-03-09: 13 mg/h via INTRAVENOUS
  Administered 2023-03-09: 12 mg/h via INTRAVENOUS
  Administered 2023-03-09 (×2): 18 mg/h via INTRAVENOUS
  Filled 2023-03-08: qty 50
  Filled 2023-03-08 (×5): qty 100
  Filled 2023-03-08: qty 50
  Filled 2023-03-08 (×3): qty 100
  Filled 2023-03-08: qty 50
  Filled 2023-03-08 (×3): qty 100

## 2023-03-08 MED ORDER — FENTANYL CITRATE PF 50 MCG/ML IJ SOSY: 50.0000 ug | PREFILLED_SYRINGE | INTRAMUSCULAR | Status: DC | PRN

## 2023-03-08 MED ORDER — ACETAMINOPHEN 160 MG/5ML PO SOLN
650.0000 mg | ORAL | Status: DC | PRN
  Administered 2023-03-08 – 2023-03-09 (×2): 650 mg
  Filled 2023-03-08 (×2): qty 20.3

## 2023-03-08 MED ORDER — INSULIN ASPART 100 UNIT/ML IJ SOLN
0.0000 [IU] | INTRAMUSCULAR | Status: DC
  Administered 2023-03-08: 3 [IU] via SUBCUTANEOUS
  Administered 2023-03-08: 2 [IU] via SUBCUTANEOUS
  Administered 2023-03-09: 3 [IU] via SUBCUTANEOUS
  Administered 2023-03-09: 5 [IU] via SUBCUTANEOUS
  Administered 2023-03-09: 3 [IU] via SUBCUTANEOUS

## 2023-03-08 MED ORDER — STROKE: EARLY STAGES OF RECOVERY BOOK
Freq: Once | Status: AC
  Filled 2023-03-08: qty 1

## 2023-03-08 MED ORDER — IPRATROPIUM-ALBUTEROL 0.5-2.5 (3) MG/3ML IN SOLN: 3.0000 mL | RESPIRATORY_TRACT | Status: DC | PRN

## 2023-03-08 MED ORDER — ACETAMINOPHEN 650 MG RE SUPP: 650.0000 mg | RECTAL | Status: DC | PRN

## 2023-03-08 MED ORDER — LABETALOL HCL 5 MG/ML IV SOLN
20.0000 mg | Freq: Once | INTRAVENOUS | Status: AC
  Administered 2023-03-08: 20 mg via INTRAVENOUS
  Filled 2023-03-08: qty 4

## 2023-03-08 MED ORDER — ONDANSETRON HCL 4 MG/2ML IJ SOLN
4.0000 mg | Freq: Once | INTRAMUSCULAR | Status: AC
  Administered 2023-03-08: 4 mg via INTRAVENOUS

## 2023-03-08 MED ORDER — DOCUSATE SODIUM 50 MG/5ML PO LIQD
100.0000 mg | Freq: Two times a day (BID) | ORAL | Status: DC
  Administered 2023-03-08 – 2023-03-09 (×2): 100 mg
  Filled 2023-03-08 (×2): qty 10

## 2023-03-08 MED ORDER — SODIUM CHLORIDE 0.9 % IV SOLN: 3.0000 g | INTRAVENOUS | Status: DC

## 2023-03-08 MED ORDER — CLEVIDIPINE BUTYRATE 0.5 MG/ML IV EMUL: 0.0000 mg/h | INTRAVENOUS | Status: DC

## 2023-03-08 MED ORDER — ACETAMINOPHEN 325 MG PO TABS: 650.0000 mg | ORAL_TABLET | ORAL | Status: DC | PRN

## 2023-03-08 MED ORDER — CHLORHEXIDINE GLUCONATE CLOTH 2 % EX PADS
6.0000 | MEDICATED_PAD | Freq: Every day | CUTANEOUS | Status: DC
  Administered 2023-03-08 – 2023-03-09 (×2): 6 via TOPICAL

## 2023-03-08 MED ORDER — SODIUM CHLORIDE 0.9 % IV SOLN
3.0000 g | Freq: Two times a day (BID) | INTRAVENOUS | Status: DC
  Administered 2023-03-08: 3 g via INTRAVENOUS
  Filled 2023-03-08: qty 8

## 2023-03-08 MED ORDER — PROPOFOL 1000 MG/100ML IV EMUL
5.0000 ug/kg/min | INTRAVENOUS | Status: DC
  Administered 2023-03-08: 30 ug/kg/min via INTRAVENOUS
  Administered 2023-03-08: 40 ug/kg/min via INTRAVENOUS
  Administered 2023-03-09: 15 ug/kg/min via INTRAVENOUS
  Administered 2023-03-09: 10 ug/kg/min via INTRAVENOUS
  Filled 2023-03-08 (×3): qty 100

## 2023-03-08 MED ORDER — LABETALOL HCL 5 MG/ML IV SOLN
20.0000 mg | INTRAVENOUS | Status: DC | PRN
  Administered 2023-03-09: 20 mg via INTRAVENOUS
  Filled 2023-03-08: qty 4

## 2023-03-08 MED ORDER — POLYETHYLENE GLYCOL 3350 17 G PO PACK
17.0000 g | PACK | Freq: Every day | ORAL | Status: DC
  Filled 2023-03-08: qty 1

## 2023-03-08 MED ORDER — SENNOSIDES 8.8 MG/5ML PO SYRP
5.0000 mL | ORAL_SOLUTION | Freq: Two times a day (BID) | ORAL | Status: DC
  Administered 2023-03-08 – 2023-03-09 (×2): 5 mL
  Filled 2023-03-08 (×2): qty 5

## 2023-03-08 MED ORDER — SENNOSIDES-DOCUSATE SODIUM 8.6-50 MG PO TABS
1.0000 | ORAL_TABLET | Freq: Two times a day (BID) | ORAL | Status: DC
  Administered 2023-03-08: 1 via ORAL
  Filled 2023-03-08: qty 1

## 2023-03-08 MED ORDER — SODIUM CHLORIDE 3 % IV SOLN
INTRAVENOUS | Status: DC
  Filled 2023-03-08 (×3): qty 500

## 2023-03-08 MED ORDER — ONDANSETRON HCL 4 MG/2ML IJ SOLN
INTRAMUSCULAR | Status: AC
  Filled 2023-03-08: qty 2

## 2023-03-08 MED ORDER — ORAL CARE MOUTH RINSE
15.0000 mL | OROMUCOSAL | Status: DC
  Administered 2023-03-08 – 2023-03-09 (×19): 15 mL via OROMUCOSAL

## 2023-03-08 NOTE — Progress Notes (Signed)
Patient was transported from the ER to 4N21 via the ventilator with no complications.

## 2023-03-08 NOTE — Progress Notes (Signed)
Pharmacy Antibiotic Note  Randy Ross is a 52 y.o. male admitted on 02/10/2023 with concern for aspiration pneumonia after vomiting during CT for code stroke.  Pharmacy has been consulted for Unasyn dosing.  Plan: Unasyn 3g IV Q12H.  Height: 5\' 5"  (165.1 cm) Weight: 84 kg (185 lb 3 oz) IBW/kg (Calculated) : 61.5  No data recorded.  Recent Labs  Lab 02/22/2023 0115 03/02/2023 0120  WBC 11.0*  --   CREATININE 9.70* 10.90*    Estimated Creatinine Clearance: 8 mL/min (A) (by C-G formula based on SCr of 10.9 mg/dL (H)).    Allergies  Allergen Reactions   Gabapentin Shortness Of Breath and Rash    Scratchy throat    Metformin And Related Other (See Comments)    Affecting kidney function     Thank you for allowing pharmacy to be a part of this patient's care.  Vernard Gambles, PharmD, BCPS  02/13/2023 3:13 AM

## 2023-03-08 NOTE — H&P (Signed)
Stroke Neurology Admission History & Physical   CC: Code stroke-left-sided flaccid paralysis, nonverbal, altered mental status  History is obtained from: Chart review, EMS  HPI: Randy Ross is a 52 y.o. 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 Last known well time was 9:30 PM and he went to bed but then somehow at after midnight, it was noted by family that he was not acting right and was unable to move the left side.  EMS was called.  They noted right gaze forced deviation as well as left-sided flaccid paralysis, for which a code stroke was activated. Systolic blood pressure noted to be in the 230s on scene and underwent the hospital. Evaluated emergently as a code stroke on the bridge-detailed exam below Stat CT head showed 52 cc intracerebral hemorrhage centered in the right basal ganglia extending into the right frontal and temporal lobes with mass effect on the right lateral and third ventricle with interval enlargement of the left lateral ventricle concerning for entrapment.  Approximately 4 mm right to left midline shift-likely underestimated due to motion As a CT head was being completed, he had a large episode of vomitus and probably aspirated as well  LKW: 9:30 PM 03/07/2023 IV thrombolysis given?: no, ICH Premorbid modified Rankin scale (mRS): Able to reliably ascertain. Current neurology notes from February document a very good neurological exam although the discharge neurological exam from December was extremely poor-family not at bedside to get collateral information at this time   ROS Unable to obtain due to altered mental status.   Past Medical History:  Diagnosis Date   Anemia    CKD (chronic kidney disease)     on wed 12/09/21   Diabetes mellitus without complication (HCC)    type 2   Hypertension    Renal disorder    History reviewed. No pertinent family history.  Social History:   reports that he has been smoking cigarettes. He has been smoking an average of .15 packs per day. He has never used smokeless tobacco. He reports that he does not currently use alcohol. He reports that he does not currently use drugs.  Medications  Current Facility-Administered Medications:    [START ON 02/15/2023]  stroke: early stages of recovery book, , Does not apply, Once, Milon Dikes, MD   acetaminophen (TYLENOL) tablet 650 mg, 650 mg, Oral, Q4H PRN **OR** acetaminophen (TYLENOL) 160 MG/5ML solution 650 mg, 650 mg, Per Tube, Q4H PRN **OR** acetaminophen (TYLENOL) suppository 650 mg, 650 mg, Rectal, Q4H PRN, Milon Dikes, MD   [COMPLETED] labetalol (NORMODYNE) injection 20 mg, 20 mg, Intravenous, Once, 20 mg at 02/18/2023 0141 **AND** clevidipine (CLEVIPREX) infusion 0.5 mg/mL, 0-21 mg/hr, Intravenous, Continuous, Milon Dikes, MD, Last Rate: 16 mL/hr at 02/20/2023 0223, 8 mg/hr at 03/01/2023 0223   pantoprazole (PROTONIX) injection 40 mg, 40 mg, Intravenous, QHS, Milon Dikes, MD   propofol (DIPRIVAN) 1000 MG/100ML infusion, 5-80 mcg/kg/min, Intravenous, Titrated, Sabas Sous, MD, Last Rate: 20.2 mL/hr at 03/02/2023 0200, 40 mcg/kg/min at 02/12/2023 0200   senna-docusate (Senokot-S) tablet 1 tablet, 1 tablet, Oral, BID, Milon Dikes, MD   sodium chloride (hypertonic) 3 % solution, , Intravenous, Continuous, Milon Dikes, MD   sodium chloride 3% (hypertonic) IV bolus 250 mL, 250 mL, Intravenous, Once, Milon Dikes, MD, Last Rate: 500 mL/hr at 02/28/2023  0207, 250 mL at 02/24/2023 0207  Current Outpatient Medications:    acetaminophen (TYLENOL) 325 MG tablet, Take 2 tablets (650 mg total) by mouth every 6 (six) hours as needed for moderate pain. (Patient taking differently: Take 650 mg by mouth as needed for moderate  pain or headache.), Disp: 30 tablet, Rfl: 0   benzonatate (TESSALON) 100 MG capsule, Take 1 capsule (100 mg total) by mouth 2 (two) times daily as needed for cough., Disp: 20 capsule, Rfl: 0   calcitRIOL (ROCALTROL) 0.25 MCG capsule, Take 1 capsule (0.25 mcg total) by mouth daily., Disp: 30 capsule, Rfl: 0   carvedilol (COREG) 25 MG tablet, Take 1 tablet (25 mg total) by mouth 2 (two) times daily., Disp: 60 tablet, Rfl: 1   cloNIDine (CATAPRES) 0.1 MG tablet, Take 1 tablet (0.1 mg total) by mouth 3 (three) times daily., Disp: 60 tablet, Rfl: 11   diphenhydrAMINE-zinc acetate (BENADRYL) cream, Apply topically 3 (three) times daily. Apply to itchy areas on the legs, Disp: 28.4 g, Rfl: 0   Emollient (CETAPHIL) cream, Apply 1 application  topically daily as needed (itching)., Disp: , Rfl:    hydrALAZINE (APRESOLINE) 100 MG tablet, Take 1 tablet (100 mg total) by mouth every 8 (eight) hours., Disp: , Rfl:    hydrOXYzine (ATARAX) 25 MG tablet, Take by mouth., Disp: , Rfl:    LOKELMA 10 g PACK packet, SMARTSIG:1 Packet(s) By Mouth Every Other Day, Disp: , Rfl:    multivitamin (RENA-VIT) TABS tablet, Take 1 tablet by mouth at bedtime., Disp: 30 tablet, Rfl: 0   NIFEdipine (PROCARDIA XL/NIFEDICAL-XL) 90 MG 24 hr tablet, Take 1 tablet (90 mg total) by mouth daily., Disp: 30 tablet, Rfl: 1   oxyCODONE-acetaminophen (PERCOCET) 5-325 MG tablet, Take 1 tablet by mouth every 4 (four) hours as needed for severe pain., Disp: 20 tablet, Rfl: 0   QUEtiapine (SEROQUEL) 25 MG tablet, Take 1 tablet (25 mg total) by mouth at bedtime., Disp: , Rfl:    SEMGLEE, YFGN, 100 UNIT/ML Pen, Inject 10 Units into the skin daily., Disp: , Rfl:    sevelamer carbonate (RENVELA) 800 MG tablet, Take 2 tablets (1,600 mg total) by mouth 3 (three) times daily with meals., Disp: , Rfl:    tetrahydrozoline-zinc (VISINE-AC) 0.05-0.25 % ophthalmic solution, Place 1 drop into both eyes 3 (three) times daily as needed (dry eyes)., Disp: , Rfl:    Exam: Current vital signs: BP (!) 260/131   Pulse 88   Resp 18   Ht 5\' 5"  (1.651 m)   Wt 84 kg   SpO2 100%   BMI 30.82 kg/m  Vital signs in last 24 hours: Pulse Rate:  [80-101] 88 (05/28 0210) Resp:  [8-30] 18 (05/28 0210) BP: (191-266)/(93-131) 260/131 (05/28 0210) SpO2:  [99 %-100 %] 100 % (05/28 0210) FiO2 (%):  [100 %] 100 % (05/28 0200) Weight:  [84 kg] 84 kg (05/28 0100) General: Drowsy HEENT: Normocephalic atraumatic Lungs: Clear Cardiovascular: Regular rhythm Neurological exam Drowsy, does not follow commands, mumbles incomprehensibly Cranial nerves: Pupils equal, right gaze preference, does not blink to threat from either side, left facial weakness at rest Motor examination: Flaccid left upper extremity and left lower extremity.  Full strength in the right upper and lower extremity. Sensation: Minimal grimace to noxious stimulation on the left and no withdrawal.  Strong grimace and withdrawal to noxious stimulation on the right  NIHSS 20  Labs I have reviewed labs in epic and the results pertinent to this consultation are:  CBC    Component Value Date/Time   WBC 11.0 (H) 03/02/2023 0115   RBC 5.35 03/10/2023 0115   HGB 12.9 (L) 02/25/2023 0120   HGB 10.5 (L) 08/09/2019 0827   HCT 38.0 (L) 03/02/2023 0120   PLT 354 03/05/2023 0115   PLT 396 08/09/2019 0827   MCV 68.0 (L) 03/03/2023 0115   MCH 21.1 (L) 03/10/2023 0115   MCHC 31.0 03/05/2023 0115   RDW 21.7 (H) 02/17/2023 0115   LYMPHSABS 2.1 03/06/2023 0115   MONOABS 0.8 03/02/2023 0115   EOSABS 0.2 02/24/2023 0115   BASOSABS 0.1 02/28/2023 0115    CMP     Component Value Date/Time   NA 144 03/03/2023 0120   K 4.8 02/18/2023 0120   CL 99 02/16/2023 0120   CO2 30 02/26/2023 0115   GLUCOSE 119 (H) 02/25/2023 0120   BUN 54 (H) 02/25/2023 0120   CREATININE 10.90 (H) 02/26/2023 0120   CREATININE 2.54 (H) 08/09/2019 0827   CALCIUM 7.8 (L) 02/15/2023 0115   PROT 7.6 02/12/2023 0115   ALBUMIN 3.5  02/27/2023 0115   AST 19 03/10/2023 0115   AST 15 08/09/2019 0827   ALT 27 02/12/2023 0115   ALT 13 08/09/2019 0827   ALKPHOS 83 02/27/2023 0115   BILITOT 0.4 02/13/2023 0115   BILITOT 0.2 (L) 08/09/2019 0827   GFRNONAA 6 (L) 02/19/2023 0115   GFRNONAA NOT CALCULATED 08/09/2019 0827   GFRAA NOT CALCULATED 08/09/2019 0827    Lipid Panel     Component Value Date/Time   CHOL 186 08/26/2022 0410   TRIG 66 10/11/2022 0501   HDL 21 (L) 08/26/2022 0410   CHOLHDL 8.9 08/26/2022 0410   VLDL 74 (H) 08/26/2022 0410   LDLCALC 91 08/26/2022 0410     Imaging I have reviewed the images obtained:  CT-head  52 cc intracerebral hemorrhage centered in the right basal ganglia extending into the right frontal and temporal lobes with mass effect on the right lateral and third ventricle with interval enlargement of the left lateral ventricle concerning for entrapment.  Approximately 4 mm right to left midline shift-likely underestimated due to motion  Assessment:  52 year old with above past medical history presenting with sudden onset of altered mental status, rightward gaze preference and flaccid left-sided hemiplegia, noted to have a large intracerebral hemorrhage in the right basal ganglia stenting into the right frontal and temporal lobes with mass effect on the right lateral ventricle and third ventricle with concern for entrapment of the left lateral ventricle.  4 mm right to left midline shift.  Also had an episode of vomiting with probable aspiration requiring emergent intubation in the emergency room. Likely hypertensive hemorrhage in the setting of underlying microvascular disease due to other risk factors such as ESRD.  Plan: Subcortical ICH, nontraumatic  Acuity: Acute Laterality: Right basal ganglia Current suspected etiology: Hypertension Treatment: -Admit to neurological ICU -ICH Score:2 -ICH Volume: 52 cc -BP control goal SYS 130-150 -Emergent neurosurgical consultation-EDP  spoke with Dr. Danielle Dess who will see the patient.  Does not plan an emergent intervention per EDP communication -PT/OT/ST  -neuromonitoring  CNS Cerebral edema Compression of brain -Hyperosmolar therapy-bolus 3% followed by 3% at 75 cc an hour with goal sodium of 150-155 -NSGY consult  -Close neuro monitoring  Obstructive hydrocephalus -Awaiting neurosurgical evaluation  Dysarthria Dysphagia following ICH  -NPO until cleared by speech -ST -Advance diet as tolerated -May need PEG  Hemiplegia and hemiparesis following nontraumatic intracerebral hemorrhage affecting left non-dominant side  -Continue  PT/OT/ST  RESP Acute Respiratory Failure, possible aspiration pneumonia -vent management per ICU-appreciate PCCM consult -wean when able  CV Hypertensive emergency -Aggressive BP control, goal BP 130-150 -Labetalol and hydralazine as needed followed by Cleviprex drip. -Delay in obtaining good blood pressure control due to requirement for emergent intubation as well as loss of IV access  -Transthoracic echo  GI/GU ESRD -Dialysis per home regimen -Nephrology consult  HEME Hemoglobin 11.3-anemia of chronic disease likely versus iron deficiency anemia Transfuse for hemoglobin less than 7   ENDO Type 2 diabetes mellitus with hyperglycemia  -SSI -goal HgbA1c < 7  Fluid/Electrolyte Disorders Check labs in the morning and replete lites as necessary  ID Possible Aspiration PNA -CXR -NPO -Monitor   Prophylaxis DVT: SCD GI: PPI Bowel: Docusate senna   Code Status: Full Code  Plan discussed with Dr. Pilar Plate   Consults called: PCCM Dr. Francine Graven, neurosurgery Dr. Danielle Dess (by the EDP).  Nephrology Dr. Juel Burrow  THE FOLLOWING WERE PRESENT ON ADMISSION: Hemiplegia, intracerebral hemorrhage, hydrocephalus, cerebral edema, aspiration pneumonia probable, hypertensive emergency, ESRD  -- Milon Dikes, MD Neurologist [facility] Pager: [phone]   CRITICAL  CARE ATTESTATION Performed by: Milon Dikes, MD Total critical care time: 55 minutes Critical care time was exclusive of separately billable procedures and treating other patients and/or supervising APPs/Residents/Students Critical care was necessary to treat or prevent imminent or life-threatening deterioration. This patient is critically ill and at significant risk for neurological worsening and/or death and care requires constant monitoring. Critical care was time spent personally by me on the following activities: development of treatment plan with patient and/or surrogate as well as nursing, discussions with consultants, evaluation of patient's response to treatment, examination of patient, obtaining history from patient or surrogate, ordering and performing treatments and interventions, ordering and review of laboratory studies, ordering and review of radiographic studies, pulse oximetry, re-evaluation of patient's condition, participation in multidisciplinary rounds and medical decision making of high complexity in the care of this patient.

## 2023-03-08 NOTE — IPAL (Signed)
  Interdisciplinary Goals of Care Family Meeting   Date carried out: 02/23/2023  Location of the meeting: Bedside  Member's involved: Physician, Nurse Practitioner, Bedside Registered Nurse, and Family Member or next of kin  Durable Power of Attorney or acting medical decision maker: Randy Ross    Discussion: We discussed goals of care for Randy Ross .   Agreement between family members for DNR (no chest compressions or ACLS medications) however they would like to continue with current measures. Awaiting other family members prior to making further decisions regarding goals of care. Palliative medicine team consulted for assistance in decision making.   Code status:   Code Status: DNR   Disposition: Continue current acute care  Time spent for the meeting: 30 minttes    Elmer Picker, NP  03/04/2023, 10:06 AM

## 2023-03-08 NOTE — Progress Notes (Signed)
Sputum obtained, labeled and given to RN to send to lab.

## 2023-03-08 NOTE — Consult Note (Signed)
Reason for Consult:Right thalamic cerebral hemorrhagic stroke Referring Physician: Haedyn Odle is an 52 y.o. male.  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  He had recovered from that with physical therapy and was functioning reasonably well.  The patient has an underlying history of end-stage renal disease and has been on dialysis.  His CT scan showed evidence of old left frontal infarct.  The patient was brought to the emergency room today having had another cerebral hemorrhage in the right thalamic region.  This initially was measured at approximately 50 cc volume with mild right to left shift.  He required subsequent intubation and a repeat CT scan this morning demonstrates that the patient's hemorrhage has now increased to 100 cc with 12 mm of shift.  Past Medical History:  Diagnosis Date   Anemia    CKD (chronic kidney disease)    on wed 12/09/21   Diabetes mellitus without complication (HCC)    type 2   Hypertension    Renal disorder     Past Surgical History:  Procedure Laterality Date   AV FISTULA PLACEMENT Left 12/10/2021   Procedure: LEFT RADIAL CEPHALIC  ARTERIOVENOUS (AV) FISTULA;  Surgeon: Maeola Harman, MD;  Location: [facility] OR;  Service: Vascular;  Laterality: Left;   IR DIALY SHUNT INTRO NEEDLE/INTRACATH INITIAL W/IMG LEFT Left 09/13/2022   IR FLUORO GUIDE CV LINE RIGHT  12/09/2021   IR FLUORO GUIDE CV LINE RIGHT  09/15/2022   IR US GUIDE VASC ACCESS RIGHT  12/09/2021   IR US GUIDE VASC ACCESS RIGHT  09/15/2022   LUMBAR DISC SURGERY  2021   L5-S1   REVISON OF ARTERIOVENOUS FISTULA Left 11/02/2022   Procedure: REVISION OF LEFT FOREARM ARTERIOVENOUS FISTULA WITH LIGATION OF COMPETING BRANCHES AND SUPERFICIALIZATION;  Surgeon: Chuck Hint, MD;  Location: [facility] OR;  Service: Vascular;  Laterality: Left;    History reviewed. No pertinent  family history.  Social History:  reports that he has been smoking cigarettes. He has been smoking an average of .15 packs per day. He has never used smokeless tobacco. He reports that he does not currently use alcohol. He reports that he does not currently use drugs.  Allergies:  Allergies  Allergen Reactions   Gabapentin Shortness Of Breath and Rash    Scratchy throat    Metformin And Related Other (See Comments)    Affecting kidney function     Medications: I have reviewed the patient's current medications.  Results for orders placed or performed during the hospital encounter of 02/21/2023 (from the past 48 hour(s))  CBG monitoring, ED     Status: Abnormal   Collection Time: 02/10/2023  1:13 AM  Result Value Ref Range   Glucose-Capillary 130 (H) 70 - 99 mg/dL    Comment: Glucose reference range applies only to samples taken after fasting for at least 8 hours.  Ethanol     Status: None   Collection Time: 02/17/2023  1:15 AM  Result Value Ref Range   Alcohol, Ethyl (B) <10 <10 mg/dL    Comment: (NOTE) Lowest detectable limit for serum alcohol is 10 mg/dL.  For medical purposes only. Performed at [facility], [address] [place], [place] [address]   Protime-INR     Status: None   Collection Time: 03/07/2023  1:15 AM  Result Value Ref Range   Prothrombin Time 12.0 11.4 - 15.2  seconds   INR 0.9 0.8 - 1.2    Comment: (NOTE) INR goal varies based on device and disease states. Performed at [facility], [address], [place], [place] [address]   APTT     Status: None   Collection Time: 03/04/2023  1:15 AM  Result Value Ref Range   aPTT 32 24 - 36 seconds    Comment: Performed at [facility], [address] [place], [place] [address]  CBC     Status: Abnormal   Collection Time: 02/22/2023  1:15 AM  Result Value Ref Range   WBC 11.0 (H) 4.0 - 10.5 K/uL   RBC 5.35 4.22 - 5.81 MIL/uL   Hemoglobin 11.3 (L) 13.0 - 17.0 g/dL   HCT 91.4 (L) 78.2 - 95.6 %    MCV 68.0 (L) 80.0 - 100.0 fL   MCH 21.1 (L) 26.0 - 34.0 pg   MCHC 31.0 30.0 - 36.0 g/dL   RDW 21.3 (H) 08.6 - 57.8 %   Platelets 354 150 - 400 K/uL   nRBC 0.0 0.0 - 0.2 %    Comment: Performed at [facility], [address] [place], [place] [address]  Differential     Status: Abnormal   Collection Time: 02/15/2023  1:15 AM  Result Value Ref Range   Neutrophils Relative % 71 %   Neutro Abs 7.8 (H) 1.7 - 7.7 K/uL   Lymphocytes Relative 19 %   Lymphs Abs 2.1 0.7 - 4.0 K/uL   Monocytes Relative 7 %   Monocytes Absolute 0.8 0.1 - 1.0 K/uL   Eosinophils Relative 2 %   Eosinophils Absolute 0.2 0.0 - 0.5 K/uL   Basophils Relative 1 %   Basophils Absolute 0.1 0.0 - 0.1 K/uL   nRBC 0 0 /100 WBC   Immature Granulocytes 0 %   Abs Immature Granulocytes 0.00 0.00 - 0.07 K/uL   Polychromasia PRESENT    Target Cells PRESENT     Comment: Performed at [facility], [address] [place], [place] [address]  Comprehensive metabolic panel     Status: Abnormal   Collection Time: 03/03/2023  1:15 AM  Result Value Ref Range   Sodium 142 135 - 145 mmol/L   Potassium 4.8 3.5 - 5.1 mmol/L   Chloride 95 (L) 98 - 111 mmol/L   CO2 30 22 - 32 mmol/L   Glucose, Bld 120 (H) 70 - 99 mg/dL    Comment: Glucose reference range applies only to samples taken after fasting for at least 8 hours.   BUN 57 (H) 6 - 20 mg/dL   Creatinine, Ser 1.32 (H) 0.61 - 1.24 mg/dL   Calcium 7.8 (L) 8.9 - 10.3 mg/dL   Total Protein 7.6 6.5 - 8.1 g/dL   Albumin 3.5 3.5 - 5.0 g/dL   AST 19 15 - 41 U/L   ALT 27 0 - 44 U/L   Alkaline Phosphatase 83 38 - 126 U/L   Total Bilirubin 0.4 0.3 - 1.2 mg/dL   GFR, Estimated 6 (L) >60 mL/min    Comment: (NOTE) Calculated using the CKD-EPI Creatinine Equation (2021)    Anion gap 17 (H) 5 - 15    Comment: Performed at [facility], [address], [place], [place] [address]  I-stat chem 8, ED     Status: Abnormal   Collection Time: 03/05/2023  1:20 AM  Result Value Ref  Range   Sodium 144 135 - 145 mmol/L  Potassium 4.8 3.5 - 5.1 mmol/L   Chloride 99 98 - 111 mmol/L   BUN 54 (H) 6 - 20 mg/dL   Creatinine, Ser 16.10 (H) 0.61 - 1.24 mg/dL   Glucose, Bld 960 (H) 70 - 99 mg/dL    Comment: Glucose reference range applies only to samples taken after fasting for at least 8 hours.   Calcium, Ion 0.90 (L) 1.15 - 1.40 mmol/L   TCO2 34 (H) 22 - 32 mmol/L   Hemoglobin 12.9 (L) 13.0 - 17.0 g/dL   HCT 45.4 (L) 09.8 - 11.9 %    CT HEAD CODE STROKE WO CONTRAST  Result Date: 02/25/2023 CLINICAL DATA:  Code stroke. EXAM: CT HEAD WITHOUT CONTRAST TECHNIQUE: Contiguous axial images were obtained from the base of the skull through the vertex without intravenous contrast. RADIATION DOSE REDUCTION: This exam was performed according to the departmental dose-optimization program which includes automated exposure control, adjustment of the mA and/or kV according to patient size and/or use of iterative reconstruction technique. COMPARISON:  01/14/2023 FINDINGS: Evaluation is significantly limited by motion. Brain: Hyperdense hemorrhage, likely centered in the right basal ganglia, extending into the right frontal and temporal lobes, measuring up to 5.3 x 3.9 x 5.0 cm (AP x TR x CC) (series 4, image 32 and series 5, image 21), with an approximate volume of 52 mL. Surrounding hypodensity, likely edema, with mass effect on the right lateral and third ventricles. Possible interval enlargement of the left lateral ventricle, concerning for entrapment. Hemorrhage extends into right lateral ventricle and possibly third ventricle. Approximately 4 mm of right-to-left midline shift, although this is likely underestimated due to motion. No definite acute infarct.  No evidence of mass. Vascular: No hyperdense vessel. Skull: Motion limited but no definite fracture or focal lesion. Sinuses/Orbits: No acute finding. Other: None. IMPRESSION: 1. Evaluation is significantly limited by motion. Within this  limitation, there is a 5.3 cm intraparenchymal hemorrhage, likely centered in the right basal ganglia, extending into the right frontal and temporal lobes, with an approximate volume of 52 mL. Surrounding edema with mass effect on the right lateral and third ventricles. Possible interval enlargement of the left lateral ventricle, concerning for entrapment. 2. Approximately 4 mm of right-to-left midline shift, although this is likely underestimated due to motion. Imaging results were communicated on 02/23/2023 at 1:40 am to provider Dr. Wilford Corner via secure text paging. Electronically Signed   By: Wiliam Ke M.D.   On: 02/21/2023 01:41    Review of Systems  Unable to perform ROS: Acuity of condition   Blood pressure (!) 260/131, pulse 88, resp. rate 18, height 5\' 5"  (1.651 m), weight 84 kg, SpO2 100 %. Physical Exam Neurological:     Comments: The patient is intubated.  His pupils are 2 mm and sluggishly reactive.  His gaze is fixed.  There is a negative doll's response.  There is some gagging to suctioning and breathing over the vent slightly.  He has some flexion response to deep central pain and to gagging and coughing.  I cannot elicit any eye-opening to loud voice or painful stimuli.       Assessment/Plan: Patient has a large volume right hemispheric intracerebral hemorrhage with substantial shift of 12 mm.  Given the patient's previous history of multiple strokes and the size of the current intracerebral hemorrhage I do not believe that surgical evacuation of this hemorrhage is in any way going to be restorative of function and likelihood of functional recovery is exceedingly low.  In  addition he is vulnerable to further hemorrhagic strokes giving his past history and underlying medical conditions.  Therefore believe that comfort care would be in his best interest.  I have expressed my concerns to the patient's brother by phone and the patient's fianc who is present at the bedside.  Shary Key  Markel Mergenthaler 02/21/2023, 2:27 AM

## 2023-03-08 NOTE — Progress Notes (Addendum)
STROKE TEAM PROGRESS NOTE   INTERVAL HISTORY His family is at the bedside.  PMT consulted for GOC discussion.  CCM on board to manage ventilator. Discussion with neurosurgery this morning, he is not a candidate for surgery.   Vitals:   03/01/2023 0500 02/12/2023 0600 02/26/2023 0700 03/06/2023 0745  BP: 131/66 137/61 (!) 143/68 (!) 157/64  Pulse: 94  99 (!) 108  Resp: 18  20 16   Temp:      TempSrc:      SpO2: 100%  98% 100%  Weight:      Height:       CBC:  Recent Labs  Lab 03/10/2023 0115 03/01/2023 0120 02/26/2023 0255  WBC 11.0*  --   --   NEUTROABS 7.8*  --   --   HGB 11.3* 12.9* 11.9*  HCT 36.4* 38.0* 35.0*  MCV 68.0*  --   --   PLT 354  --   --    Basic Metabolic Panel:  Recent Labs  Lab 02/14/2023 0115 03/07/2023 0120 03/04/2023 0255 02/25/2023 0430  NA 142 144 146* 168*  K 4.8 4.8 4.2  --   CL 95* 99  --   --   CO2 30  --   --   --   GLUCOSE 120* 119*  --   --   BUN 57* 54*  --   --   CREATININE 9.70* 10.90*  --   --   CALCIUM 7.8*  --   --   --    Lipid Panel: No results for input(s): "CHOL", "TRIG", "HDL", "CHOLHDL", "VLDL", "LDLCALC" in the last 168 hours. HgbA1c: No results for input(s): "HGBA1C" in the last 168 hours. Urine Drug Screen:  Recent Labs  Lab 02/13/2023 0313  LABOPIA NONE DETECTED  COCAINSCRNUR NONE DETECTED  LABBENZ NONE DETECTED  AMPHETMU NONE DETECTED  THCU NONE DETECTED  LABBARB NONE DETECTED    Alcohol Level  Recent Labs  Lab 03/04/2023 0115  ETH <10    IMAGING past 24 hours CT HEAD WO CONTRAST  Result Date: 03/05/2023 CLINICAL DATA:  Stroke follow-up EXAM: CT HEAD WITHOUT CONTRAST TECHNIQUE: Contiguous axial images were obtained from the base of the skull through the vertex without intravenous contrast. RADIATION DOSE REDUCTION: This exam was performed according to the departmental dose-optimization program which includes automated exposure control, adjustment of the mA and/or kV according to patient size and/or use of iterative reconstruction  technique. COMPARISON:  Head CT from earlier today FINDINGS: Brain: Progressive size and extent of ICH centered in the deep right cerebral white matter with extension into the anterior temporal white matter and basal ganglia. The hemorrhage measures 100 cc in volume (5 x 7 x 5.5 cm) with local swelling contributing to progressive leftward mass effect of 12 mm. Progressive dilatation of the temporal horn of the left lateral ventricle. Increase in intraventricular blood clot which reaches the lower fourth ventricle. Effacement of extra-axial CSF spaces since prior. No interval infarct detected. There is chronic white matter disease. Dr. Wilford Corner is aware of the progression. Vascular: No hyperdense vessel or unexpected calcification. Skull: Normal. Negative for fracture or focal lesion. Sinuses/Orbits: No acute finding. IMPRESSION: 1. Worsening ICH in the right hemisphere measuring ~100 cc. Contributing edema with progressive midline shift of 12 mm. 2. Progressive intraventricular blood clot. Some degree of left lateral ventricular entrapment. Electronically Signed   By: Tiburcio Pea M.D.   On: 03/03/2023 06:17   DG Chest Port 1 View  Result Date: 02/10/2023 CLINICAL DATA:  Evaluate endotracheal tube placement. EXAM: PORTABLE CHEST 1 VIEW COMPARISON:  January 14, 2023 FINDINGS: Endotracheal tube is seen with its distal tip approximately 2.8 cm from the carina. An enteric tube is noted with its distal end looped within the body of the stomach. The cardiac silhouette is mildly enlarged and unchanged in size. Mild areas of bilateral suprahilar and bilateral infrahilar atelectasis and/or infiltrate are noted. No pleural effusion or pneumothorax is identified. The visualized skeletal structures are unremarkable. IMPRESSION: 1. Endotracheal tube and enteric tube positioning, as described above. 2. Mild bilateral suprahilar and bilateral infrahilar atelectasis and/or infiltrate. Electronically Signed   By: Aram Candela  M.D.   On: 02/14/2023 02:32   CT HEAD CODE STROKE WO CONTRAST  Result Date: 02/17/2023 CLINICAL DATA:  Code stroke. EXAM: CT HEAD WITHOUT CONTRAST TECHNIQUE: Contiguous axial images were obtained from the base of the skull through the vertex without intravenous contrast. RADIATION DOSE REDUCTION: This exam was performed according to the departmental dose-optimization program which includes automated exposure control, adjustment of the mA and/or kV according to patient size and/or use of iterative reconstruction technique. COMPARISON:  01/14/2023 FINDINGS: Evaluation is significantly limited by motion. Brain: Hyperdense hemorrhage, likely centered in the right basal ganglia, extending into the right frontal and temporal lobes, measuring up to 5.3 x 3.9 x 5.0 cm (AP x TR x CC) (series 4, image 32 and series 5, image 21), with an approximate volume of 52 mL. Surrounding hypodensity, likely edema, with mass effect on the right lateral and third ventricles. Possible interval enlargement of the left lateral ventricle, concerning for entrapment. Hemorrhage extends into right lateral ventricle and possibly third ventricle. Approximately 4 mm of right-to-left midline shift, although this is likely underestimated due to motion. No definite acute infarct.  No evidence of mass. Vascular: No hyperdense vessel. Skull: Motion limited but no definite fracture or focal lesion. Sinuses/Orbits: No acute finding. Other: None. IMPRESSION: 1. Evaluation is significantly limited by motion. Within this limitation, there is a 5.3 cm intraparenchymal hemorrhage, likely centered in the right basal ganglia, extending into the right frontal and temporal lobes, with an approximate volume of 52 mL. Surrounding edema with mass effect on the right lateral and third ventricles. Possible interval enlargement of the left lateral ventricle, concerning for entrapment. 2. Approximately 4 mm of right-to-left midline shift, although this is likely  underestimated due to motion. Imaging results were communicated on 02/09/2023 at 1:40 am to provider Dr. Wilford Corner via secure text paging. Electronically Signed   By: Wiliam Ke M.D.   On: 03/04/2023 01:41    PHYSICAL EXAM  Constitutional: Intubated, ill appearing Cardiovascular: Normal rate and regular rhythm.  Respiratory: Effort normal, non-labored breathing  Neuro: Mental Status: Intubated, unresponsive to verbal/noxious stimuli Cranial Nerves: II: eyes closed. R pupil 2.50mm and left 2mm. No blink to threat, eyes midline, corneal absent. Cough and gag present Breathing over the vent. Motor: Slight withdraw of right UE and LE but no movement of LUE and LLE     ASSESSMENT/PLAN Randy Ross is a 52 y.o. 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   ICH:  Right large BG ICH and IVH, etiology likely due to uncontrolled HTN  Code Stroke CT head - 5.3 cm intraparenchymal hemorrhage, likely centered in the right basal ganglia, extending into  the right frontal and temporal lobes, with an approximate volume of 52 mL. 4 mm of right-to-left midline shift  Repeat CT Head- Worsening ICH in the right hemisphere measuring ~100 cc. Contributing edema with progressive midline shift of 12 mm. 2D Echo EF 70-75% LDL 91 HgbA1c 5.9 VTE prophylaxis - SCDs No antithrombotic prior to admission, now on No antithrombotic.  Therapy recommendations:  Pending  Disposition:  Pending   Cerebral edema CT head showed midline shift 9 mm and progressed to 12 mm HTS - on hold due to fluid status and potential comfort care measures Na 146 NSGY consult- not a surgical candidate  Hypoxic respiratory failure  Aspiration  Intubated in the ED after  aspiration event  PCCM managing vent   Hx stroke/ICH 09/2022-admitted for headache and slurred speech.  CT showed right cerebellar small ICH, repeat CT stable hematoma.  CTA head and neck no AVM or aneurysm.  EF 50 to 55%.  EEG no seizure.  LDL 91, A1c 5.9.  MRI also showed multiple bilateral embolic infarcts.   Hypertension Home meds:  Coreg, hydralazine, nifedipine Unstable BP goal 130-150 Cleviprex IVP Labetalol as needed  Other Stroke Risk Factors   Other Active Problems End stage renal disease on HD, next HD planned for 5/29 Chronic anemia due to [facility]  Hospital day # 0  Patient seen and examined by NP/APP with MD. MD to update note as needed.   Elmer Picker, DNP, FNP-BC [facility] Pager: [phone]  ATTENDING NOTE: I reviewed above note and agree with the assessment and plan. Pt was seen and examined.   Family including brother, sister and mom at bedside.  Patient intubated on low dose propofol, eyes closed, not following commands. With forced eye opening, eyes in mid position, not blinking to visual threat, doll's eyes absent, not tracking, right pupil 2.48mm, left pupil 2mm, both very sluggish to light. Corneal reflex absent, gag and cough present. Breathing over the vent.  Facial symmetry not able to test due to ET tube.  Tongue protrusion not cooperative. On pain stimulation, slight withdraw of right UE and LE but no movement of LUE and LLE. Sensation, coordination and gait not tested.   I had long discussion with family at bedside, updated pt current condition, treatment plan and potential for prognosis, and answered all the questions.  They expressed understanding and appreciation.  They understood the gravity of current situation, reflected that patient would not want aggressive measures given per prognosis.  They agree with palliative care consult and will discuss among family members regarding potential comfort care measures.  Will hold off any  hypertonic saline at this time given potential comfort care measures.  For detailed assessment and plan, please refer to above/below as I have made changes wherever appropriate.   Marvel Plan, MD PhD Stroke Neurology 02/15/2023 7:30 PM  This patient is critically ill due to large right BG ICH, IVH, cerebral edema, respiratory failure and at significant risk of neurological worsening, death form brain herniation, brain death. This patient's care requires constant monitoring of vital signs, hemodynamics, respiratory and cardiac monitoring, review of multiple databases, neurological assessment, discussion with family, other specialists and medical decision making of high complexity. I spent 45 minutes of neurocritical care time in the care of this patient.  To contact Stroke Continuity provider, please refer to [email or web address]. After hours, contact General Neurology

## 2023-03-08 NOTE — Progress Notes (Signed)
This chaplain responded to the unit's consult for spiritual care. The chaplain was updated by the unit and will coordinate care with the PMT. Family is not at the bedside.  This chaplain is available for F/U spiritual care as needed.  Chaplain Stephanie Acre [phone]

## 2023-03-08 NOTE — Consult Note (Addendum)
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 Last known normal time was 9:30pm last night. Upon arrival, EMS noted sBP in the 230s. Code stroke was called and he was emergently evaluated by ED and Neurology teams. CT head showed 52cc intracerebral hemorrhage in the right basal ganglia extending into the right frontal and temporal lobes with mass effect on the right lateral and third ventricle. Approximately 4mm midline shift to left. Patient had large aspiration event while in CT scanner. He was emergently intubated.   PCCM called for ventilator management.   ABG on PRVC rate 18, Vt 490, PEEP 5, Fio2 100 was pH 7.49, pCO2 45.6, pO2 260.  CXR with ET tube 2.8cm from carina and bilateral suprahilar and infrahilar infiltrate.   Unable to upon history from patient due to mental status.   Pertinent  Medical History   Past Medical History:  Diagnosis Date   Anemia    CKD (chronic kidney disease)    on wed 12/09/21   Diabetes mellitus without complication (HCC)    type 2   Hypertension    Renal disorder    Significant Hospital Events: Including procedures, antibiotic start and stop dates in addition to other pertinent events   5/28 admitted to ICU with ICH and respiratory failure - intubated  Interim History / Subjective:  As above  Objective   Blood pressure 138/69, pulse 86, resp. rate 19, height 5\' 5"  (1.651 m), weight 84 kg, SpO2 100 %.    Vent Mode: PRVC FiO2 (%):  [100 %] 100 % Set Rate:  [18 bmp] 18 bmp Vt Set:  [490 mL] 490 mL PEEP:  [5  cmH20] 5 cmH20 Plateau Pressure:  [16 cmH20] 16 cmH20   Intake/Output Summary (Last 24 hours) at 02/11/2023 0251 Last data filed at 02/16/2023 0241 Gross per 24 hour  Intake 250 ml  Output --  Net 250 ml   Filed Weights   02/13/2023 0100  Weight: 84 kg    Examination: General: middle aged male, intubated, no acute distress HENT: [place]/AT, moist mucous membranes Lungs: course breath sounds bilaterally, no wheezing Cardiovascular: rrr, no murmurs Abdomen: soft, non-distended, BS+ Extremities: warm, no edema Neuro: sedated, PERRL GU: foley being placed  Resolved Hospital Problem list     Assessment & Plan:  Acute Intracerebral Hemorrhage  Cerebral Edema with compression of Brain - Neuro primary - Neurosurgery consulted, no acute intervention planned - Hypertonic saline initiated, goal Na 150-155  Acute Hypoxemic Respiratory Failure Aspiration Pneumonia - continue mechanical ventilatory support - 8cc/kg tidal volume, maintain plateau pressure less than 30cmH2O - Daily PSV trial and sedation wean - Propofol and PRN fentanyl for vent sedation per PAD protocol - Unasyn for aspiration coverage - F/u tracheal aspirate culture - PRN duonebs  Hypertensive Emergency - goal sBP 130-147mmHg - cleveprex drip - PRN hydralazine and labetalol  ESRD on HD - Nephrology for HD needs  DMII - ssi  Anemia of Chronic Disease - transfuse for hemoglobin less than 7g/dL  Best Practice (right click and "Reselect all SmartList Selections" daily)   Diet/type: NPO DVT  prophylaxis: not indicated GI prophylaxis: PPI Lines: N/A Foley:  Yes, and it is still needed Code Status:  full code Last date of multidisciplinary goals of care discussion [pending]  Labs   CBC: Recent Labs  Lab 03/05/2023 0115 03/05/2023 0120  WBC 11.0*  --   NEUTROABS 7.8*  --   HGB 11.3* 12.9*  HCT 36.4* 38.0*  MCV 68.0*  --   PLT 354  --     Basic Metabolic Panel: Recent Labs  Lab 03/07/2023 0115  02/27/2023 0120  NA 142 144  K 4.8 4.8  CL 95* 99  CO2 30  --   GLUCOSE 120* 119*  BUN 57* 54*  CREATININE 9.70* 10.90*  CALCIUM 7.8*  --    GFR: Estimated Creatinine Clearance: 8 mL/min (A) (by C-G formula based on SCr of 10.9 mg/dL (H)). Recent Labs  Lab 02/16/2023 0115  WBC 11.0*    Liver Function Tests: Recent Labs  Lab 02/15/2023 0115  AST 19  ALT 27  ALKPHOS 83  BILITOT 0.4  PROT 7.6  ALBUMIN 3.5   No results for input(s): "LIPASE", "AMYLASE" in the last 168 hours. No results for input(s): "AMMONIA" in the last 168 hours.  ABG    Component Value Date/Time   PHART 7.480 (H) 08/25/2022 0151   PCO2ART 42.3 08/25/2022 0151   PO2ART 76 (L) 08/25/2022 0151   HCO3 31.6 (H) 08/25/2022 0151   TCO2 34 (H) 02/20/2023 0120   O2SAT 96 08/25/2022 0151     Coagulation Profile: Recent Labs  Lab 03/01/2023 0115  INR 0.9    Cardiac Enzymes: No results for input(s): "CKTOTAL", "CKMB", "CKMBINDEX", "TROPONINI" in the last 168 hours.  HbA1C: Hgb A1c MFr Bld  Date/Time Value Ref Range Status  08/25/2022 02:31 AM 5.9 (H) 4.8 - 5.6 % Final    Comment:    (NOTE)         Prediabetes: 5.7 - 6.4         Diabetes: >6.4         Glycemic control for adults with diabetes: <7.0   12/08/2021 08:20 PM 5.9 (H) 4.8 - 5.6 % Final    Comment:    (NOTE) Pre diabetes:          5.7%-6.4%  Diabetes:              >6.4%  Glycemic control for   <7.0% adults with diabetes     CBG: Recent Labs  Lab 02/09/2023 0113  GLUCAP 130*    Review of Systems:   Unable to perform due to mental status and being intubated.  Past Medical History:  He,  has a past medical history of Anemia, CKD (chronic kidney disease), Diabetes mellitus without complication (HCC), Hypertension, and Renal disorder.   Surgical History:   Past Surgical History:  Procedure Laterality Date   AV FISTULA PLACEMENT Left 12/10/2021   Procedure: LEFT RADIAL CEPHALIC  ARTERIOVENOUS (AV) FISTULA;  Surgeon: Maeola Harman, MD;  Location: [facility] OR;  Service: Vascular;  Laterality: Left;   IR DIALY SHUNT INTRO NEEDLE/INTRACATH INITIAL W/IMG LEFT Left 09/13/2022   IR FLUORO GUIDE CV LINE RIGHT  12/09/2021   IR FLUORO GUIDE CV LINE RIGHT  09/15/2022   IR US GUIDE VASC ACCESS RIGHT  12/09/2021   IR US GUIDE VASC ACCESS RIGHT  09/15/2022   LUMBAR DISC SURGERY  2021   L5-S1   REVISON OF ARTERIOVENOUS FISTULA Left 11/02/2022   Procedure: REVISION OF LEFT FOREARM ARTERIOVENOUS FISTULA WITH  LIGATION OF COMPETING BRANCHES AND SUPERFICIALIZATION;  Surgeon: Chuck Hint, MD;  Location: [facility] OR;  Service: Vascular;  Laterality: Left;     Social History:   reports that he has been smoking cigarettes. He has been smoking an average of .15 packs per day. He has never used smokeless tobacco. He reports that he does not currently use alcohol. He reports that he does not currently use drugs.   Family History:  His family history is not on file.   Allergies Allergies  Allergen Reactions   Gabapentin Shortness Of Breath and Rash    Scratchy throat    Metformin And Related Other (See Comments)    Affecting kidney function      Home Medications  Prior to Admission medications   Medication Sig Start Date End Date Taking? Authorizing Provider  acetaminophen (TYLENOL) 325 MG tablet Take 2 tablets (650 mg total) by mouth every 6 (six) hours as needed for moderate pain. Patient taking differently: Take 650 mg by mouth as needed for moderate pain or headache. 05/19/22   Wallis Bamberg, PA-C  benzonatate (TESSALON) 100 MG capsule Take 1 capsule (100 mg total) by mouth 2 (two) times daily as needed for cough. 10/14/22   Rai, Delene Ruffini, MD  calcitRIOL (ROCALTROL) 0.25 MCG capsule Take 1 capsule (0.25 mcg total) by mouth daily. 12/16/21   Elgergawy, Leana Roe, MD  carvedilol (COREG) 25 MG tablet Take 1 tablet (25 mg total) by mouth 2 (two) times daily. 01/14/23   Prosperi, Christian H, PA-C  cloNIDine (CATAPRES) 0.1 MG tablet Take 1  tablet (0.1 mg total) by mouth 3 (three) times daily. 10/14/22   Rai, Delene Ruffini, MD  diphenhydrAMINE-zinc acetate (BENADRYL) cream Apply topically 3 (three) times daily. Apply to itchy areas on the legs 10/14/22   Rai, Ripudeep K, MD  Emollient (CETAPHIL) cream Apply 1 application  topically daily as needed (itching).    [provider]  hydrALAZINE (APRESOLINE) 100 MG tablet Take 1 tablet (100 mg total) by mouth every 8 (eight) hours. 10/01/22   Leatha Gilding, MD  hydrOXYzine (ATARAX) 25 MG tablet Take by mouth. 11/01/22   [provider]  LOKELMA 10 g PACK packet SMARTSIG:1 Packet(s) By Mouth Every Other Day    [provider]  multivitamin (RENA-VIT) TABS tablet Take 1 tablet by mouth at bedtime. 12/15/21   Elgergawy, Leana Roe, MD  NIFEdipine (PROCARDIA XL/NIFEDICAL-XL) 90 MG 24 hr tablet Take 1 tablet (90 mg total) by mouth daily. 01/14/23   Prosperi, Christian H, PA-C  oxyCODONE-acetaminophen (PERCOCET) 5-325 MG tablet Take 1 tablet by mouth every 4 (four) hours as needed for severe pain. 11/02/22 11/02/23  Chuck Hint, MD  QUEtiapine (SEROQUEL) 25 MG tablet Take 1 tablet (25 mg total) by mouth at bedtime. 10/01/22   Gherghe, Daylene Katayama, MD  SEMGLEE, YFGN, 100 UNIT/ML Pen Inject 10 Units into the skin daily. 04/23/22   [provider]  sevelamer carbonate (RENVELA) 800 MG tablet Take 2 tablets (1,600 mg total) by mouth 3 (three) times daily with meals. 10/14/22   Rai, Delene Ruffini, MD  tetrahydrozoline-zinc (VISINE-AC) 0.05-0.25 % ophthalmic solution Place 1 drop into both eyes 3 (three) times daily as needed (dry eyes).    [provider]     Critical care time: 45 minutes    Melody Comas, MD [facility] Office: [phone]   See Amion for personal pager PCCM on call pager [phone] until 7pm. Please call Elink 7p-7a.  [phone]     

## 2023-03-08 NOTE — ED Notes (Signed)
Intubation  1:55 IV infiltrated New IV establish 20G L forearm 1:55 Etomidate 12 1:56 Roc. 100mg    1:57 intubated 7.5 24 at the lip.

## 2023-03-08 NOTE — Progress Notes (Signed)
  Echocardiogram 2D Echocardiogram has been performed.  Randy Ross 02/18/2023, 3:17 PM

## 2023-03-08 NOTE — Consult Note (Signed)
Reason for Consult: ESRD Referring Physician:  Dr. Milon Dikes  Chief Complaint:  AMS and left sided flaccid paralysis  Dialysis Orders:  SW MWF 3h  76.1kg   400/800   2/2 bath  Hep 2000  LFA AVF - last HD 11/13, post 80.1kg - mircera 200 q2, last 5/6 - hectorol 2 mcg IV q HD  Assessment/Plan: ESRD s/p ligation of collaterals and superficialization of lt Cimino on 11/02/22 by Dr. Edilia Bo through a longitudinal incision. IV in left distal forearm medial aspect; asked nurse to remove unless needed for an emergency (only on TKO and not getting any infusion). Patient has an IV on the right side receiving Propofol.  Plan next HD without Heparin on 5/29; completed full treatment on 5/27; currently no emergent indication. Will have to dialyze on higher Na to help maintain goal SNa of 150-155 to decrease cerebral edema.  ICH secondary to hypertension not thought to be from trauma being managed with 3%  hypertonic therapy with goal Na 150-155 followed by NeuroICU team and neurosurgery.  Renal osteodystrophy - binders once on enteral feeds; will check a phos as well Anemia - transfuse as needed if Hb drops below 7. Will dose ESA as needed. Hypertension - IVP + Cleviprex gtt with goal 30% decrease in the 1st 24hrs and then SBP 130-150's.  Aspiration followed by emergent intubation on the ventilator with bilateral perihilar infiltrates on sedation per CCM team DM on SSI   HPI: Randy Ross is an 52 y.o. 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 EMS noted right game deviation and left sided flaccid paralysis upon arrival and code stroke was activated. Of note the SBP was in the 230's and CT head shoed a ICH in the right basal ganglia with extension and mass effect with right to left midline shift. He did vomit  immediately post CT and aspirated with emergent intubation following. He was hypertensive 197/106 post dialysis on 5/27, post HD on 5/24 137/88. PreHD weight on 5/27 was 87kg, unclear if this weight was accurate as he left at 78.3kg after HD on 5/24.  ROS Pertinent items are noted in HPI.  Chemistry and CBC: Creatinine  Date/Time Value Ref Range Status  08/09/2019 08:27 AM 2.54 (H) 0.61 - 1.24 mg/dL Final   Creatinine, Ser  Date/Time Value Ref Range Status  02/12/2023 01:20 AM 10.90 (H) 0.61 - 1.24 mg/dL Final  09/81/1914 78:29 AM 9.70 (H) 0.61 - 1.24 mg/dL Final  56/21/3086 57:84 AM 5.01 (H) 0.61 - 1.24 mg/dL Final  69/62/9528 41:32 AM 9.90 (H) 0.61 - 1.24 mg/dL Final  44/10/270 53:66 AM 7.85 (H) 0.61 - 1.24 mg/dL Final  44/12/4740 59:56 AM 11.94 (H) 0.61 - 1.24 mg/dL Final  38/75/6433 29:51 AM 10.01 (H) 0.61 - 1.24 mg/dL Final  88/41/6606 30:16 AM 10.45 (H) 0.61 - 1.24 mg/dL Final  10/19/3233 57:32 AM 7.47 (H) 0.61 - 1.24 mg/dL Final  20/25/4270 62:37 PM 5.90 (H) 0.61 - 1.24 mg/dL Final  62/83/1517 61:60 AM 10.85 (H) 0.61 - 1.24 mg/dL Final  73/71/0626 94:85 AM 6.76 (H) 0.61 - 1.24 mg/dL Final  46/27/0350 09:38 AM 9.53 (H) 0.61 - 1.24 mg/dL Final  18/29/9371 69:67 AM 9.57 (H) 0.61 - 1.24 mg/dL Final  89/38/1017 51:02 AM 6.63 (H) 0.61 - 1.24 mg/dL Final  58/52/7782 42:35 AM 9.65 (  H) 0.61 - 1.24 mg/dL Final  16/07/9603 54:09 AM 7.62 (H) 0.61 - 1.24 mg/dL Final    Comment:    DELTA CHECK NOTED  09/25/2022 03:58 AM 4.96 (H) 0.61 - 1.24 mg/dL Final  81/19/1478 29:56 AM 6.73 (H) 0.61 - 1.24 mg/dL Final  21/30/8657 84:69 AM 8.91 (H) 0.61 - 1.24 mg/dL Final  62/95/2841 32:44 AM 6.60 (H) 0.61 - 1.24 mg/dL Final  10/13/7251 66:44 PM 10.17 (H) 0.61 - 1.24 mg/dL Final  03/47/4259 56:38 AM 5.12 (H) 0.61 - 1.24 mg/dL Final  75/64/3329 51:88 AM 6.68 (H) 0.61 - 1.24 mg/dL Final  41/66/0630 16:01 AM 6.96 (H) 0.61 - 1.24 mg/dL Final  09/32/3557 32:20 AM 9.40 (H) 0.61 - 1.24 mg/dL Final   25/42/7062 37:62 AM 6.70 (H) 0.61 - 1.24 mg/dL Final  83/15/1761 60:73 AM 4.68 (H) 0.61 - 1.24 mg/dL Final  71/03/2693 85:46 AM 6.33 (H) 0.61 - 1.24 mg/dL Final    Comment:    DELTA CHECK NOTED  09/09/2022 05:49 AM 3.88 (H) 0.61 - 1.24 mg/dL Final  27/12/5007 38:18 AM 5.78 (H) 0.61 - 1.24 mg/dL Final  29/93/7169 67:89 PM 4.11 (H) 0.61 - 1.24 mg/dL Final    Comment:    DELTA CHECK NOTED  09/07/2022 05:12 AM 9.11 (H) 0.61 - 1.24 mg/dL Final  38/07/1750 02:58 PM 8.13 (H) 0.61 - 1.24 mg/dL Final  52/77/8242 35:36 AM 7.12 (H) 0.61 - 1.24 mg/dL Final  14/43/1540 08:67 PM 5.61 (H) 0.61 - 1.24 mg/dL Final  61/95/0932 67:12 AM 6.67 (H) 0.61 - 1.24 mg/dL Final  45/80/9983 38:25 AM 8.86 (H) 0.61 - 1.24 mg/dL Final  05/39/7673 41:93 AM 7.17 (H) 0.61 - 1.24 mg/dL Final  79/11/4095 35:32 AM 8.61 (H) 0.61 - 1.24 mg/dL Final  99/24/2683 41:96 AM 6.64 (H) 0.61 - 1.24 mg/dL Final  22/29/7989 21:19 PM 4.74 (H) 0.61 - 1.24 mg/dL Final  41/74/0814 48:18 AM 8.50 (H) 0.61 - 1.24 mg/dL Final  56/31/4970 26:37 PM 7.02 (H) 0.61 - 1.24 mg/dL Final  85/88/5027 74:12 AM 6.59 (H) 0.61 - 1.24 mg/dL Final  87/86/7672 09:47 AM 7.07 (H) 0.61 - 1.24 mg/dL Final  09/62/8366 29:47 AM 4.80 (H) 0.61 - 1.24 mg/dL Final  65/46/5035 46:56 PM 5.64 (H) 0.61 - 1.24 mg/dL Final  81/27/5170 01:74 AM 7.87 (H) 0.61 - 1.24 mg/dL Final  94/49/6759 16:38 PM 10.69 (H) 0.61 - 1.24 mg/dL Final  46/65/9935 70:17 AM 11.68 (H) 0.61 - 1.24 mg/dL Final  79/39/0300 92:33 PM 11.10 (H) 0.61 - 1.24 mg/dL Final  00/76/2263 33:54 PM 12.10 (H) 0.61 - 1.24 mg/dL Final   Recent Labs  Lab 02/21/2023 0115 02/16/2023 0120 03/07/2023 0255  NA 142 144 146*  K 4.8 4.8 4.2  CL 95* 99  --   CO2 30  --   --   GLUCOSE 120* 119*  --   BUN 57* 54*  --   CREATININE 9.70* 10.90*  --   CALCIUM 7.8*  --   --    Recent Labs  Lab 02/15/2023 0115 03/06/2023 0120 03/02/2023 0255  WBC 11.0*  --   --   NEUTROABS 7.8*  --   --   HGB 11.3* 12.9* 11.9*  HCT 36.4*  38.0* 35.0*  MCV 68.0*  --   --   PLT 354  --   --    Liver Function Tests: Recent Labs  Lab 02/19/2023 0115  AST 19  ALT 27  ALKPHOS 83  BILITOT 0.4  PROT 7.6  ALBUMIN 3.5  No results for input(s): "LIPASE", "AMYLASE" in the last 168 hours. No results for input(s): "AMMONIA" in the last 168 hours. Cardiac Enzymes: No results for input(s): "CKTOTAL", "CKMB", "CKMBINDEX", "TROPONINI" in the last 168 hours. Iron Studies: No results for input(s): "IRON", "TIBC", "TRANSFERRIN", "FERRITIN" in the last 72 hours. PT/INR: @LABRCNTIP (inr:5)  Xrays/Other Studies: ) Results for orders placed or performed during the hospital encounter of 03/04/2023 (from the past 48 hour(s))  CBG monitoring, ED     Status: Abnormal   Collection Time: 03/06/2023  1:13 AM  Result Value Ref Range   Glucose-Capillary 130 (H) 70 - 99 mg/dL    Comment: Glucose reference range applies only to samples taken after fasting for at least 8 hours.  Ethanol     Status: None   Collection Time: 02/24/2023  1:15 AM  Result Value Ref Range   Alcohol, Ethyl (B) <10 <10 mg/dL    Comment: (NOTE) Lowest detectable limit for serum alcohol is 10 mg/dL.  For medical purposes only. Performed at [facility], [address], [place], [place] [address]   Protime-INR     Status: None   Collection Time: 03/05/2023  1:15 AM  Result Value Ref Range   Prothrombin Time 12.0 11.4 - 15.2 seconds   INR 0.9 0.8 - 1.2    Comment: (NOTE) INR goal varies based on device and disease states. Performed at [facility], [address] [place], [place] [address]   APTT     Status: None   Collection Time: 02/22/2023  1:15 AM  Result Value Ref Range   aPTT 32 24 - 36 seconds    Comment: Performed at [facility], [address] [place], [place] [address]  CBC     Status: Abnormal   Collection Time: 02/16/2023  1:15 AM  Result Value Ref Range   WBC 11.0 (H) 4.0 - 10.5 K/uL   RBC 5.35 4.22 - 5.81 MIL/uL   Hemoglobin 11.3 (L)  13.0 - 17.0 g/dL   HCT 91.4 (L) 78.2 - 95.6 %   MCV 68.0 (L) 80.0 - 100.0 fL   MCH 21.1 (L) 26.0 - 34.0 pg   MCHC 31.0 30.0 - 36.0 g/dL   RDW 21.3 (H) 08.6 - 57.8 %   Platelets 354 150 - 400 K/uL   nRBC 0.0 0.0 - 0.2 %    Comment: Performed at [facility], [address], [place], [place] [address]  Differential     Status: Abnormal   Collection Time: 02/17/2023  1:15 AM  Result Value Ref Range   Neutrophils Relative % 71 %   Neutro Abs 7.8 (H) 1.7 - 7.7 K/uL   Lymphocytes Relative 19 %   Lymphs Abs 2.1 0.7 - 4.0 K/uL   Monocytes Relative 7 %   Monocytes Absolute 0.8 0.1 - 1.0 K/uL   Eosinophils Relative 2 %   Eosinophils Absolute 0.2 0.0 - 0.5 K/uL   Basophils Relative 1 %   Basophils Absolute 0.1 0.0 - 0.1 K/uL   nRBC 0 0 /100 WBC   Immature Granulocytes 0 %   Abs Immature Granulocytes 0.00 0.00 - 0.07 K/uL   Polychromasia PRESENT    Target Cells PRESENT     Comment: Performed at [facility], [address] [place], [place] [address]  Comprehensive metabolic panel     Status: Abnormal   Collection Time: 03/08/23  1:15 AM  Result Value Ref Range   Sodium 142 135 -  145 mmol/L   Potassium 4.8 3.5 - 5.1 mmol/L   Chloride 95 (L) 98 - 111 mmol/L   CO2 30 22 - 32 mmol/L   Glucose, Bld 120 (H) 70 - 99 mg/dL    Comment: Glucose reference range applies only to samples taken after fasting for at least 8 hours.   BUN 57 (H) 6 - 20 mg/dL   Creatinine, Ser 1.61 (H) 0.61 - 1.24 mg/dL   Calcium 7.8 (L) 8.9 - 10.3 mg/dL   Total Protein 7.6 6.5 - 8.1 g/dL   Albumin 3.5 3.5 - 5.0 g/dL   AST 19 15 - 41 U/L   ALT 27 0 - 44 U/L   Alkaline Phosphatase 83 38 - 126 U/L   Total Bilirubin 0.4 0.3 - 1.2 mg/dL   GFR, Estimated 6 (L) >60 mL/min    Comment: (NOTE) Calculated using the CKD-EPI Creatinine Equation (2021)    Anion gap 17 (H) 5 - 15    Comment: Performed at [facility], [address], [place], [place] [address]  I-stat chem 8, ED     Status: Abnormal    Collection Time: 02/19/2023  1:20 AM  Result Value Ref Range   Sodium 144 135 - 145 mmol/L   Potassium 4.8 3.5 - 5.1 mmol/L   Chloride 99 98 - 111 mmol/L   BUN 54 (H) 6 - 20 mg/dL   Creatinine, Ser 54.09 (H) 0.61 - 1.24 mg/dL   Glucose, Bld 811 (H) 70 - 99 mg/dL    Comment: Glucose reference range applies only to samples taken after fasting for at least 8 hours.   Calcium, Ion 0.90 (L) 1.15 - 1.40 mmol/L   TCO2 34 (H) 22 - 32 mmol/L   Hemoglobin 12.9 (L) 13.0 - 17.0 g/dL   HCT 91.4 (L) 78.2 - 95.6 %  I-Stat arterial blood gas, ED     Status: Abnormal   Collection Time: 03/01/2023  2:55 AM  Result Value Ref Range   pH, Arterial 7.485 (H) 7.35 - 7.45   pCO2 arterial 45.6 32 - 48 mmHg   pO2, Arterial 260 (H) 83 - 108 mmHg   Bicarbonate 34.4 (H) 20.0 - 28.0 mmol/L   TCO2 36 (H) 22 - 32 mmol/L   O2 Saturation 100 %   Acid-Base Excess 10.0 (H) 0.0 - 2.0 mmol/L   Sodium 146 (H) 135 - 145 mmol/L   Potassium 4.2 3.5 - 5.1 mmol/L   Calcium, Ion 0.98 (L) 1.15 - 1.40 mmol/L   HCT 35.0 (L) 39.0 - 52.0 %   Hemoglobin 11.9 (L) 13.0 - 17.0 g/dL   Patient temperature 21.3 F    Collection site RADIAL, ALLEN'S TEST ACCEPTABLE    Drawn by RT    Sample type ARTERIAL   Glucose, capillary     Status: Abnormal   Collection Time: 03/03/2023  3:32 AM  Result Value Ref Range   Glucose-Capillary 251 (H) 70 - 99 mg/dL    Comment: Glucose reference range applies only to samples taken after fasting for at least 8 hours.   DG Chest Port 1 View  Result Date: 03/10/2023 CLINICAL DATA:  Evaluate endotracheal tube placement. EXAM: PORTABLE CHEST 1 VIEW COMPARISON:  January 14, 2023 FINDINGS: Endotracheal tube is seen with its distal tip approximately 2.8 cm from the carina. An enteric tube is noted with its distal end looped within the body of the stomach. The cardiac silhouette is mildly enlarged and unchanged in size. Mild areas of bilateral suprahilar  and bilateral infrahilar atelectasis and/or infiltrate are noted.  No pleural effusion or pneumothorax is identified. The visualized skeletal structures are unremarkable. IMPRESSION: 1. Endotracheal tube and enteric tube positioning, as described above. 2. Mild bilateral suprahilar and bilateral infrahilar atelectasis and/or infiltrate. Electronically Signed   By: Aram Candela M.D.   On: 03/07/2023 02:32   CT HEAD CODE STROKE WO CONTRAST  Result Date: 02/21/2023 CLINICAL DATA:  Code stroke. EXAM: CT HEAD WITHOUT CONTRAST TECHNIQUE: Contiguous axial images were obtained from the base of the skull through the vertex without intravenous contrast. RADIATION DOSE REDUCTION: This exam was performed according to the departmental dose-optimization program which includes automated exposure control, adjustment of the mA and/or kV according to patient size and/or use of iterative reconstruction technique. COMPARISON:  01/14/2023 FINDINGS: Evaluation is significantly limited by motion. Brain: Hyperdense hemorrhage, likely centered in the right basal ganglia, extending into the right frontal and temporal lobes, measuring up to 5.3 x 3.9 x 5.0 cm (AP x TR x CC) (series 4, image 32 and series 5, image 21), with an approximate volume of 52 mL. Surrounding hypodensity, likely edema, with mass effect on the right lateral and third ventricles. Possible interval enlargement of the left lateral ventricle, concerning for entrapment. Hemorrhage extends into right lateral ventricle and possibly third ventricle. Approximately 4 mm of right-to-left midline shift, although this is likely underestimated due to motion. No definite acute infarct.  No evidence of mass. Vascular: No hyperdense vessel. Skull: Motion limited but no definite fracture or focal lesion. Sinuses/Orbits: No acute finding. Other: None. IMPRESSION: 1. Evaluation is significantly limited by motion. Within this limitation, there is a 5.3 cm intraparenchymal hemorrhage, likely centered in the right basal ganglia, extending into the  right frontal and temporal lobes, with an approximate volume of 52 mL. Surrounding edema with mass effect on the right lateral and third ventricles. Possible interval enlargement of the left lateral ventricle, concerning for entrapment. 2. Approximately 4 mm of right-to-left midline shift, although this is likely underestimated due to motion. Imaging results were communicated on 02/20/2023 at 1:40 am to provider Dr. Wilford Corner via secure text paging. Electronically Signed   By: Wiliam Ke M.D.   On: 03/07/2023 01:41    PMH:   Past Medical History:  Diagnosis Date   Anemia    CKD (chronic kidney disease)    on wed 12/09/21   Diabetes mellitus without complication (HCC)    type 2   Hypertension    Renal disorder     PSH:   Past Surgical History:  Procedure Laterality Date   AV FISTULA PLACEMENT Left 12/10/2021   Procedure: LEFT RADIAL CEPHALIC  ARTERIOVENOUS (AV) FISTULA;  Surgeon: Maeola Harman, MD;  Location: [facility] OR;  Service: Vascular;  Laterality: Left;   IR DIALY SHUNT INTRO NEEDLE/INTRACATH INITIAL W/IMG LEFT Left 09/13/2022   IR FLUORO GUIDE CV LINE RIGHT  12/09/2021   IR FLUORO GUIDE CV LINE RIGHT  09/15/2022   IR US GUIDE VASC ACCESS RIGHT  12/09/2021   IR US GUIDE VASC ACCESS RIGHT  09/15/2022   LUMBAR DISC SURGERY  2021   L5-S1   REVISON OF ARTERIOVENOUS FISTULA Left 11/02/2022   Procedure: REVISION OF LEFT FOREARM ARTERIOVENOUS FISTULA WITH LIGATION OF COMPETING BRANCHES AND SUPERFICIALIZATION;  Surgeon: Chuck Hint, MD;  Location[facility];  Service: Vascular;  Laterality: Left;    Allergies:  Allergies  Allergen Reactions   Gabapentin Shortness Of Breath and Rash    Scratchy throat  Metformin And Related Other (See Comments)    Affecting kidney function     Medications:   Prior to Admission medications   Medication Sig Start Date End Date Taking? Authorizing Provider  acetaminophen (TYLENOL) 325 MG tablet Take 2 tablets (650 mg total) by mouth every 6  (six) hours as needed for moderate pain. Patient taking differently: Take 650 mg by mouth as needed for moderate pain or headache. 05/19/22   Wallis Bamberg, PA-C  benzonatate (TESSALON) 100 MG capsule Take 1 capsule (100 mg total) by mouth 2 (two) times daily as needed for cough. 10/14/22   Rai, Delene Ruffini, MD  calcitRIOL (ROCALTROL) 0.25 MCG capsule Take 1 capsule (0.25 mcg total) by mouth daily. 12/16/21   Elgergawy, Leana Roe, MD  carvedilol (COREG) 25 MG tablet Take 1 tablet (25 mg total) by mouth 2 (two) times daily. 01/14/23   Prosperi, Christian H, PA-C  cloNIDine (CATAPRES) 0.1 MG tablet Take 1 tablet (0.1 mg total) by mouth 3 (three) times daily. 10/14/22   Rai, Delene Ruffini, MD  diphenhydrAMINE-zinc acetate (BENADRYL) cream Apply topically 3 (three) times daily. Apply to itchy areas on the legs 10/14/22   Rai, Ripudeep K, MD  Emollient (CETAPHIL) cream Apply 1 application  topically daily as needed (itching).    [provider]  hydrALAZINE (APRESOLINE) 100 MG tablet Take 1 tablet (100 mg total) by mouth every 8 (eight) hours. 10/01/22   Leatha Gilding, MD  hydrOXYzine (ATARAX) 25 MG tablet Take by mouth. 11/01/22   [provider]  LOKELMA 10 g PACK packet SMARTSIG:1 Packet(s) By Mouth Every Other Day    [provider]  multivitamin (RENA-VIT) TABS tablet Take 1 tablet by mouth at bedtime. 12/15/21   Elgergawy, Leana Roe, MD  NIFEdipine (PROCARDIA XL/NIFEDICAL-XL) 90 MG 24 hr tablet Take 1 tablet (90 mg total) by mouth daily. 01/14/23   Prosperi, Christian H, PA-C  oxyCODONE-acetaminophen (PERCOCET) 5-325 MG tablet Take 1 tablet by mouth every 4 (four) hours as needed for severe pain. 11/02/22 11/02/23  Chuck Hint, MD  QUEtiapine (SEROQUEL) 25 MG tablet Take 1 tablet (25 mg total) by mouth at bedtime. 10/01/22   Gherghe, Daylene Katayama, MD  SEMGLEE, YFGN, 100 UNIT/ML Pen Inject 10 Units into the skin daily. 04/23/22   [provider]  sevelamer carbonate (RENVELA) 800  MG tablet Take 2 tablets (1,600 mg total) by mouth 3 (three) times daily with meals. 10/14/22   Rai, Delene Ruffini, MD  tetrahydrozoline-zinc (VISINE-AC) 0.05-0.25 % ophthalmic solution Place 1 drop into both eyes 3 (three) times daily as needed (dry eyes).    [provider]    Discontinued Meds:   Medications Discontinued During This Encounter  Medication Reason   senna-docusate (Senokot-S) tablet 1 tablet     Social History:  reports that he has been smoking cigarettes. He has been smoking an average of .15 packs per day. He has never used smokeless tobacco. He reports that he does not currently use alcohol. He reports that he does not currently use drugs.  Family History:  History reviewed. No pertinent family history.  Blood pressure (!) 141/75, pulse 86, temperature (!) 95.9 F (35.5 C), temperature source Axillary, resp. rate 20, height 5\' 5"  (1.651 m), weight 84 kg, SpO2 100 %. General appearance: intubated and sedated on ventilator Head: Normocephalic, without obvious abnormality, atraumatic Neck: no adenopathy, no carotid bruit, no JVD, supple, symmetrical, trachea midline, and thyroid not enlarged, symmetric, no tenderness/mass/nodules Back: negative Cardio: regular rate  and rhythm GI: soft, non-tender; bowel sounds normal; no masses,  no organomegaly Extremities: extremities normal, atraumatic, no cyanosis or edema Pulses: 2+ and symmetric Skin: Skin color, texture, turgor normal. No rashes or lesions Access: lt Cimino with thrill       Ethelene Hal, MD 02/21/2023, 3:59 AM

## 2023-03-08 NOTE — ED Provider Notes (Signed)
[facility]-EMERGENCY DEPT [facility] Emergency Department Provider Note MRN:  [id number]  Arrival date & time: 02/28/2023     Chief Complaint   AMS  History of Present Illness   Randy Ross is a 52 y.o. year-old male with a history of hypertension, diabetes, CKD presenting to the ED with chief complaint of altered mental status.  Found to be not acting his normal self at 1 AM.  Went to bed at 9 PM at his baseline.  Got worse and worse and family called EMS.  Code stroke initiation prior to arrival.  Review of Systems  A thorough review of systems was obtained and all systems are negative except as noted in the HPI and PMH.   Patient's Health History    Past Medical History:  Diagnosis Date   Anemia    CKD (chronic kidney disease)    on wed 12/09/21   Diabetes mellitus without complication (HCC)    type 2   Hypertension    Renal disorder     Past Surgical History:  Procedure Laterality Date   AV FISTULA PLACEMENT Left 12/10/2021   Procedure: LEFT RADIAL CEPHALIC  ARTERIOVENOUS (AV) FISTULA;  Surgeon: Maeola Harman, MD;  Location: [facility] OR;  Service: Vascular;  Laterality: Left;   IR DIALY SHUNT INTRO NEEDLE/INTRACATH INITIAL W/IMG LEFT Left 09/13/2022   IR FLUORO GUIDE CV LINE RIGHT  12/09/2021   IR FLUORO GUIDE CV LINE RIGHT  09/15/2022   IR US GUIDE VASC ACCESS RIGHT  12/09/2021   IR US GUIDE VASC ACCESS RIGHT  09/15/2022   LUMBAR DISC SURGERY  2021   L5-S1   REVISON OF ARTERIOVENOUS FISTULA Left 11/02/2022   Procedure: REVISION OF LEFT FOREARM ARTERIOVENOUS FISTULA WITH LIGATION OF COMPETING BRANCHES AND SUPERFICIALIZATION;  Surgeon: Chuck Hint, MD;  Location: [facility] OR;  Service: Vascular;  Laterality: Left;    No family history on file.  Social History   Socioeconomic History   Marital status: Single    Spouse name: Not on file   Number of children: Not on file   Years of education: Not on file   Highest education level: Not on file  Occupational  History   Not on file  Tobacco Use   Smoking status: Some Days    Packs/day: .15    Types: Cigarettes   Smokeless tobacco: Never  Vaping Use   Vaping Use: Never used  Substance and Sexual Activity   Alcohol use: Not Currently   Drug use: Not Currently   Sexual activity: Not Currently  Other Topics Concern   Not on file  Social History Narrative   ** Merged History Encounter **       Social Determinants of Health   Financial Resource Strain: Not on file  Food Insecurity: Not on file  Transportation Needs: Not on file  Physical Activity: Not on file  Stress: Not on file  Social Connections: Not on file  Intimate Partner Violence: Not on file     Physical Exam   Vitals:   02/11/2023 0142 02/09/2023 0200  BP: (!) 191/110   Pulse: 95   Resp: (!) 30   SpO2: 100% 100%    CONSTITUTIONAL: Ill-appearing, NAD NEURO/PSYCH: Aphasic, not moving left arm or left leg, somnolent, confused EYES:  eyes equal and reactive ENT/NECK:  no LAD, no JVD CARDIO: Regular rate, well-perfused, normal S1 and S2 PULM:  CTAB no wheezing or rhonchi GI/GU:  non-distended, non-tender MSK/SPINE:  No gross deformities, no edema SKIN:  no  rash, atraumatic   *Additional and/or pertinent findings included in MDM below  Diagnostic and Interventional Summary    EKG Interpretation  Date/Time:  Tuesday Mar 08 2023 01:34:27 EDT Ventricular Rate:  98 PR Interval:  158 QRS Duration: 107 QT Interval:  414 QTC Calculation: 529 R Axis:   69 Text Interpretation: Sinus rhythm Nonspecific repol abnormality, diffuse leads Prolonged QT interval Baseline wander in lead(s) V2 Confirmed by Kennis Carina [phone]) on 02/26/2023 2:03:52 AM       Labs Reviewed  CBC - Abnormal; Notable for the following components:      Result Value   WBC 11.0 (*)    Hemoglobin 11.3 (*)    HCT 36.4 (*)    MCV 68.0 (*)    MCH 21.1 (*)    RDW 21.7 (*)    All other components within normal limits  DIFFERENTIAL - Abnormal;  Notable for the following components:   Neutro Abs 7.8 (*)    All other components within normal limits  COMPREHENSIVE METABOLIC PANEL - Abnormal; Notable for the following components:   Chloride 95 (*)    Glucose, Bld 120 (*)    BUN 57 (*)    Creatinine, Ser 9.70 (*)    Calcium 7.8 (*)    GFR, Estimated 6 (*)    Anion gap 17 (*)    All other components within normal limits  I-STAT CHEM 8, ED - Abnormal; Notable for the following components:   BUN 54 (*)    Creatinine, Ser 10.90 (*)    Glucose, Bld 119 (*)    Calcium, Ion 0.90 (*)    TCO2 34 (*)    Hemoglobin 12.9 (*)    HCT 38.0 (*)    All other components within normal limits  CBG MONITORING, ED - Abnormal; Notable for the following components:   Glucose-Capillary 130 (*)    All other components within normal limits  ETHANOL  PROTIME-INR  APTT  RAPID URINE DRUG SCREEN, HOSP PERFORMED  URINALYSIS, ROUTINE W REFLEX MICROSCOPIC  HIV ANTIBODY (ROUTINE TESTING W REFLEX)  SODIUM  SODIUM  SODIUM  SODIUM    CT HEAD CODE STROKE WO CONTRAST  Final Result    CT HEAD WO CONTRAST    (Results Pending)  DG Chest Port 1 View    (Results Pending)    Medications   stroke: early stages of recovery book (has no administration in time range)  acetaminophen (TYLENOL) tablet 650 mg (has no administration in time range)    Or  acetaminophen (TYLENOL) 160 MG/5ML solution 650 mg (has no administration in time range)    Or  acetaminophen (TYLENOL) suppository 650 mg (has no administration in time range)  senna-docusate (Senokot-S) tablet 1 tablet (has no administration in time range)  pantoprazole (PROTONIX) injection 40 mg (has no administration in time range)  labetalol (NORMODYNE) injection 20 mg (20 mg Intravenous Given 02/18/2023 0141)    And  clevidipine (CLEVIPREX) infusion 0.5 mg/mL (2 mg/hr Intravenous New Bag/Given 02/17/2023 0202)  ondansetron (ZOFRAN) injection 4 mg (has no administration in time range)  sodium chloride 3%  (hypertonic) IV bolus 250 mL (250 mLs Intravenous New Bag/Given 02/17/2023 0207)  sodium chloride (hypertonic) 3 % solution (has no administration in time range)  propofol (DIPRIVAN) 1000 MG/100ML infusion (has no administration in time range)     Procedures  /  Critical Care .Critical Care  Performed by: Sabas Sous, MD Authorized by: Sabas Sous, MD   Critical care provider statement:  Critical care time (minutes):  35   Critical care was necessary to treat or prevent imminent or life-threatening deterioration of the following conditions: Hemorrhagic stroke.   Critical care was time spent personally by me on the following activities:  Development of treatment plan with patient or surrogate, discussions with consultants, evaluation of patient's response to treatment, examination of patient, ordering and review of laboratory studies, ordering and review of radiographic studies, ordering and performing treatments and interventions, pulse oximetry, re-evaluation of patient's condition and review of old charts Procedure Name: Intubation Date/Time: 03/02/2023 2:04 AM  Performed by: Sabas Sous, MDPre-anesthesia Checklist: Patient identified, Patient being monitored, Emergency Drugs available, Timeout performed and Suction available Oxygen Delivery Method: Non-rebreather mask Preoxygenation: Pre-oxygenation with 100% oxygen Induction Type: Rapid sequence Ventilation: Mask ventilation without difficulty Laryngoscope Size: Mac and 4 Grade View: Grade II Tube size: 7.5 mm Number of attempts: 1 Airway Equipment and Method: Stylet Placement Confirmation: ETT inserted through vocal cords under direct vision, CO2 detector and Breath sounds checked- equal and bilateral Secured at: 24 cm Tube secured with: ETT holder Comments: RSI intubation with 20 mg etomidate and 100 mg rocuronium.      ED Course and Medical Decision Making  Initial Impression and Ddx Concern for hemorrhagic  versus acute ischemic stroke.  Patient with paralysis on the left, aphasia.  Behaving a bit erratically, sonorous respirations, somnolent.  CT confirms a hemorrhage.  Will be admitted to neuro ICU.  Will also reach out to neurosurgery.  Patient had an episode of vomiting in CT.  He continued to demonstrate dry heaving, GCS quite diminished and so intubated as described above for airway protection.  Past medical/surgical history that increases complexity of ED encounter: Hypertension, diabetes, CKD  Interpretation of Diagnostics I personally reviewed the EKG and my interpretation is as follows: Sinus rhythm    Patient Reassessment and Ultimate Disposition/Management     Patient being sedated with propofol, on Cleviprex drip, will receive 3% saline.  Patient management required discussion with the following services or consulting groups:  Nephrology and Neurosurgery  Complexity of Problems Addressed Acute illness or injury that poses threat of life of bodily function  Additional Data Reviewed and Analyzed Further history obtained from: Further history from spouse/family member  Additional Factors Impacting ED Encounter Risk Consideration of hospitalization  Elmer Sow. Pilar Plate, MD [facility] Emergency Medicine [facility] [email or web address]  Final Clinical Impressions(s) / ED Diagnoses     ICD-10-CM   1. Hemorrhagic stroke [facility])  I61.9       ED Discharge Orders     None        Discharge Instructions Discussed with and Provided to Patient:   Discharge Instructions   None      Sabas Sous, MD 03/03/2023 [phone]

## 2023-03-08 NOTE — Progress Notes (Signed)
Repeat CTH with worsening ICH and now with IVH. Spoke with neurosurgeon on call -Dr. Danielle Dess.  He has reviewed the scans with me. He is reluctant to offer surgery given the large size of the bleed and the fact that operating and evacuating might still leave the patient with severe deficits. I spoke with the patient's significant other over the phone who was at bedside.  She is not the next of kin/blood relative.  Brother Raiford Noble is the next of kin-tried calling his phone which go straight on voicemail. His fiance is going to go home and get the family in the hospital.  Dr. Danielle Dess will also be rounding on him shortly.  Discussions to be had for further management based on neurosurgery assessment and family discussions.   -- Milon Dikes, MD Neurologist [facility] Pager: [phone]

## 2023-03-08 NOTE — Code Documentation (Signed)
Stroke Response Nurse Documentation Code Documentation  Yoseph Buchanan is a 52 y.o. male arriving to [facility]  via [facility] on 5/28 with past medical hx of ICH, HTN,  On No antithrombotic. Code stroke was activated by EMS.   Patient from home where he was LKW at 2130 and now complaining of left sided weakness and right sided gaze.   Stroke team at the bedside on patient arrival. Labs drawn and patient cleared for CT by Dr. Pilar Plate. Patient to CT with team. NIHSS 25, see documentation for details and code stroke times. Patient with decreased LOC, disoriented, not following commands, right gaze preference , bilateral hemianopia, left facial droop, left arm weakness, left leg weakness, left decreased sensation, Global aphasia , and dysarthria  on exam. The following imaging was completed:  CT Head. Patient is not a candidate for IV Thrombolytic due to ICH. Patient is not a candidate for IR due to ICH.      Bedside handoff with ED RN Fleet Contras.    Rose Fillers  Rapid Response RN

## 2023-03-08 NOTE — Progress Notes (Signed)
OT Cancellation Note  Patient Details Name: Randy Ross MRN: [id number] DOB: [birth year]   Cancelled Treatment:    Reason Eval/Treat Not Completed: Medical issues which prohibited therapy. Noted ongoing bleed without surgical intervention. OT will sign off. Please consult again if indicated.   Evern Bio Maris Abascal 02/11/2023, 10:45 AM  Nyoka Cowden OTR/L Acute Rehabilitation Services Office: [phone]

## 2023-03-08 NOTE — Progress Notes (Signed)
SLP Cancellation Note  Patient Details Name: Randy Ross MRN: [id number] DOB: April 28, 1971   Cancelled treatment:       Reason Eval/Treat Not Completed: Patient not medically ready;Other (comment) (patient on vent. SLP will follow for readiness)   Angela Nevin, MA, CCC-SLP Speech Therapy

## 2023-03-08 NOTE — Plan of Care (Signed)
  Problem: Health Behavior/Discharge Planning: Goal: Goals will be collaboratively established with patient/family Outcome: Progressing   Problem: Metabolic: Goal: Ability to maintain appropriate glucose levels will improve Outcome: Progressing   Problem: Skin Integrity: Goal: Risk for impaired skin integrity will decrease Outcome: Progressing   Problem: Tissue Perfusion: Goal: Adequacy of tissue perfusion will improve Outcome: Progressing   Problem: Education: Goal: Knowledge of disease or condition will improve Outcome: Not Progressing Goal: Knowledge of secondary prevention will improve (MUST DOCUMENT ALL) Outcome: Not Progressing Goal: Knowledge of patient specific risk factors will improve Loraine Leriche N/A or DELETE if not current risk factor) Outcome: Not Progressing   Problem: Intracerebral Hemorrhage Tissue Perfusion: Goal: Complications of Intracerebral Hemorrhage will be minimized Outcome: Not Progressing   Problem: Coping: Goal: Will verbalize positive feelings about self Outcome: Not Progressing Goal: Will identify appropriate support needs Outcome: Not Progressing   Problem: Health Behavior/Discharge Planning: Goal: Ability to manage health-related needs will improve Outcome: Not Progressing   Problem: Self-Care: Goal: Ability to participate in self-care as condition permits will improve Outcome: Not Progressing Goal: Verbalization of feelings and concerns over difficulty with self-care will improve Outcome: Not Progressing Goal: Ability to communicate needs accurately will improve Outcome: Not Progressing   Problem: Nutrition: Goal: Risk of aspiration will decrease Outcome: Not Progressing Goal: Dietary intake will improve Outcome: Not Progressing   Problem: Education: Goal: Ability to describe self-care measures that may prevent or decrease complications (Diabetes Survival Skills Education) will improve Outcome: Not Progressing Goal: Individualized  Educational Video(s) Outcome: Not Progressing   Problem: Coping: Goal: Ability to adjust to condition or change in health will improve Outcome: Not Progressing   Problem: Fluid Volume: Goal: Ability to maintain a balanced intake and output will improve Outcome: Not Progressing   Problem: Health Behavior/Discharge Planning: Goal: Ability to identify and utilize available resources and services will improve Outcome: Not Progressing Goal: Ability to manage health-related needs will improve Outcome: Not Progressing   Problem: Nutritional: Goal: Maintenance of adequate nutrition will improve Outcome: Not Progressing Goal: Progress toward achieving an optimal weight will improve Outcome: Not Progressing

## 2023-03-08 NOTE — ED Notes (Signed)
Per MD Bero, ok to use IV above fistula.

## 2023-03-08 NOTE — Progress Notes (Signed)
PT Cancellation Note  Patient Details Name: Randy Ross MRN: [id number] DOB: 02-18-1971   Cancelled Treatment:    Reason Eval/Treat Not Completed: Medical issues which prohibited therapy; noted ongoing bleed without surgical intervention.  PT will sign off.  Please consult again if indicated.    Elray Mcgregor 03/03/2023, 8:53 AM Sheran Lawless, PT Acute Rehabilitation Services Office:[phone] 02/16/2023

## 2023-03-08 NOTE — Consult Note (Signed)
Consultation Note Date: 02/12/2023   Patient Name: Randy Ross  DOB: August 09, 1971  MRN: [id number]  Age / Sex: 52 y.o., male  PCP: Medicine, [facility] Referring Physician: Stroke, Md, MD  Reason for Consultation:  ICH, medical decision making assistance  HPI/Patient Profile: 52 y.o. 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 He is not a candidate for surgical intervention. He vomited and likely aspirated, is intubated, sedated with propofol. Surgery recommends comfort measures only. Palliative medicine consulted for assistance with family medical decision making.    Primary Decision Maker NEXT OF KIN - reportedly his brother is HCPOA, no document on chart  Discussion: Chart reviewed including labs, progress notes, imaging from this and previous encounters.  Evaluated patient and discussed case with Elmer Picker, NP with neurology and patient's RN Selena Batten.  Patient with devastating hemorrhage not survivable, high risk for progressing to herniation.  Per discussions with neuro, patient is now DNR, family is discussing further care plan, likely extubation to comfort.  I called patient's brother Randy Ross and left message requesting return call.     SUMMARY OF RECOMMENDATIONS -Continue current plan- message left for patient's brother Randy Ross.  -Anticipate transition to comfort and extubation- recommend continue propofol when extubated as he appears comfortable on current dose, can titrate up if needed    Code Status/Advance Care Planning: DNR   Prognosis:   Unable to determine - anticipate hours to day when extubated  Discharge Planning: To Be Determined - likely to be hospital death  Primary Diagnoses: Present on Admission:  Nontraumatic thalamic  hemorrhage (HCC)   Review of Systems  Unable to perform ROS   Physical Exam Vitals and nursing note reviewed.  Constitutional:      General: He is not in acute distress.    Appearance: He is ill-appearing.  Pulmonary:     Comments: intubated Neurological:     Comments: unresponsive     Vital Signs: BP (!) 144/69   Pulse (!) 109   Temp 98.8 F (37.1 C) (Axillary)   Resp 18   Ht 5\' 5"  (1.651 m)   Wt 84 kg   SpO2 100%   BMI 30.82 kg/m  Pain Scale: CPOT       SpO2: SpO2: 100 % O2 Device:SpO2: 100 % O2 Flow Rate: .   IO: Intake/output summary:  Intake/Output Summary (Last 24 hours) at 03/06/2023 1535 Last data filed at 02/18/2023 1300 Gross per 24 hour  Intake 1093.06 ml  Output 250 ml  Net 843.06 ml    LBM: Last BM Date :  (PTA) Baseline Weight: Weight: 84 kg Most recent weight: Weight: 84 kg       Thank you for this consult. Palliative medicine will continue to follow and assist as needed.   Greater than 50%  of this time was spent counseling and coordinating care related to the above assessment and plan.  Signed by:  Ocie Bob, AGNP-C Palliative Medicine    Please contact Palliative Medicine Team phone at [phone] for questions and concerns.  For individual provider: See Loretha Stapler

## 2023-03-08 NOTE — Progress Notes (Signed)
This RN notified of critical lab value Na 168. Dr. Valora Piccolo MD aware. Given verbal instructions to turn off hypertonic saline at this time and to follow up with dayshift team.

## 2023-03-08 NOTE — Progress Notes (Signed)
eLink Physician-Brief Progress Note Patient Name: Randy Ross DOB: [birth year] MRN: [id number]   Date of Service  02/18/2023  HPI/Events of Note  51/M with ESRD on HD, prior hx of cerebellar hemorrhage, DM, HTN, brought in via EMS due to left sided flaccid paralysis.  Pt was hypertensive in the 230s.  CT head showed 52 cc intracerebral hemorrhage in the basal ganglia extending into the right frontal and temporal lobes with mass effect on the right lateral and third ventricle with interval enlargement of the left lateral ventricle concerning for entrapment.  There is a 4mm right to left midline shift.   Pt reportedly vomited as CT was being completed.  He was subsequently intubated.   BP 139/75, HR 89, RR 19, O2 sats 100%   eICU Interventions  Pt sedated on fentanyl and propofol.  ABG reviewed - titrate FiO2. Continue antibiotics for probable aspiration pneumonia.  Keep SBP 130-150 on nicardipine gtt.  Hypertonic saline with goal to keep Na 150-155. No neurosurgical intervention planned at this time.   Renal for HD.  SCDs for DVT prophylaxis.  Protonix for GI prophylaxis.       Intervention Category Evaluation Type: New Patient Evaluation  Larinda Buttery 03/11/2023, 3:19 AM

## 2023-03-08 NOTE — Progress Notes (Signed)
eLink Physician-Brief Progress Note Patient Name: Rick Wiltse DOB: 1971-07-14 MRN: [id number]   Date of Service  02/15/2023  HPI/Events of Note  Notified of hypernatremia with Na of 168 while on hypertonic saline at 75cc/hr.     eICU Interventions  Hold hypertonic saline for now.  Follow serum Na q6hrs.      Intervention Category Intermediate Interventions: Electrolyte abnormality - evaluation and management  Larinda Buttery 02/17/2023, 6:11 AM

## 2023-03-08 NOTE — ED Triage Notes (Signed)
Patient BIB [facility] for CODE STROKE for left side flaccid, non-verbal and altered mental status.  Family reports LKN was 2130.  Patient alert but altered upon arrival, left side flaccid.  Patient immediately taken to CT where patient had scan completed but then began to vomit.  Decision made to intubate based on need to protect patient's airway.

## 2023-03-09 DIAGNOSIS — Z7189 Other specified counseling: Secondary | ICD-10-CM | POA: Diagnosis not present

## 2023-03-09 DIAGNOSIS — I619 Nontraumatic intracerebral hemorrhage, unspecified: Secondary | ICD-10-CM | POA: Diagnosis not present

## 2023-03-09 DIAGNOSIS — Z9911 Dependence on respirator [ventilator] status: Secondary | ICD-10-CM

## 2023-03-09 LAB — BASIC METABOLIC PANEL
Anion gap: 20 — ABNORMAL HIGH (ref 5–15)
BUN: 78 mg/dL — ABNORMAL HIGH (ref 6–20)
CO2: 25 mmol/L (ref 22–32)
Calcium: 8.7 mg/dL — ABNORMAL LOW (ref 8.9–10.3)
Chloride: 100 mmol/L (ref 98–111)
Creatinine, Ser: 12.46 mg/dL — ABNORMAL HIGH (ref 0.61–1.24)
GFR, Estimated: 4 mL/min — ABNORMAL LOW (ref >60)
Glucose, Bld: 185 mg/dL — ABNORMAL HIGH (ref 70–99)
Potassium: 6.2 mmol/L — ABNORMAL HIGH (ref 3.5–5.1)
Sodium: 145 mmol/L (ref 135–145)

## 2023-03-09 LAB — CBC
HCT: 35.7 % — ABNORMAL LOW (ref 39.0–52.0)
Hemoglobin: 11.2 g/dL — ABNORMAL LOW (ref 13.0–17.0)
MCH: 21.1 pg — ABNORMAL LOW (ref 26.0–34.0)
MCHC: 31.4 g/dL (ref 30.0–36.0)
MCV: 67.4 fL — ABNORMAL LOW (ref 80.0–100.0)
Platelets: 238 10*3/uL (ref 150–400)
RBC: 5.3 MIL/uL (ref 4.22–5.81)
RDW: 21.9 % — ABNORMAL HIGH (ref 11.5–15.5)
WBC: 20.4 10*3/uL — ABNORMAL HIGH (ref 4.0–10.5)
nRBC: 0 % (ref 0.0–0.2)

## 2023-03-09 LAB — LIPID PANEL
Cholesterol: 157 mg/dL (ref 0–200)
HDL: 52 mg/dL (ref >40)
LDL Cholesterol: 77 mg/dL (ref 0–99)
Total CHOL/HDL Ratio: 3 RATIO
Triglycerides: 139 mg/dL (ref <150)
VLDL: 28 mg/dL (ref 0–40)

## 2023-03-09 LAB — GLUCOSE, CAPILLARY
Glucose-Capillary: 159 mg/dL — ABNORMAL HIGH (ref 70–99)
Glucose-Capillary: 111 mg/dL — ABNORMAL HIGH (ref 70–99)
Glucose-Capillary: 204 mg/dL — ABNORMAL HIGH (ref 70–99)

## 2023-03-09 LAB — CULTURE, RESPIRATORY W GRAM STAIN

## 2023-03-09 LAB — SODIUM: Sodium: 145 mmol/L (ref 135–145)

## 2023-03-09 LAB — HEPATITIS B SURFACE ANTIGEN: Hepatitis B Surface Ag: NONREACTIVE

## 2023-03-09 MED ORDER — POLYVINYL ALCOHOL 1.4 % OP SOLN: 1.0000 [drp] | Freq: Four times a day (QID) | OPHTHALMIC | Status: DC | PRN

## 2023-03-09 MED ORDER — FENTANYL 2500MCG IN NS 250ML (10MCG/ML) PREMIX INFUSION
0.0000 ug/h | INTRAVENOUS | Status: DC
  Administered 2023-03-09: 25 ug/h via INTRAVENOUS
  Filled 2023-03-09: qty 250

## 2023-03-09 MED ORDER — GLYCOPYRROLATE 0.2 MG/ML IJ SOLN: 0.2000 mg | INTRAMUSCULAR | Status: DC | PRN

## 2023-03-09 MED ORDER — ACETAMINOPHEN 650 MG RE SUPP: 650.0000 mg | Freq: Four times a day (QID) | RECTAL | Status: DC | PRN

## 2023-03-09 MED ORDER — CHLORHEXIDINE GLUCONATE CLOTH 2 % EX PADS
6.0000 | MEDICATED_PAD | Freq: Every day | CUTANEOUS | Status: DC
  Administered 2023-03-09: 6 via TOPICAL

## 2023-03-09 MED ORDER — GLYCOPYRROLATE 1 MG PO TABS: 1.0000 mg | ORAL_TABLET | ORAL | Status: DC | PRN

## 2023-03-09 MED ORDER — SODIUM CHLORIDE 0.9 % IV SOLN: INTRAVENOUS | Status: DC

## 2023-03-09 MED ORDER — ACETAMINOPHEN 325 MG PO TABS: 650.0000 mg | ORAL_TABLET | Freq: Four times a day (QID) | ORAL | Status: DC | PRN

## 2023-03-10 LAB — HEPATITIS B SURFACE ANTIBODY, QUANTITATIVE: Hep B S AB Quant (Post): 55 m[IU]/mL (ref >9.9)

## 2023-03-10 LAB — CULTURE, RESPIRATORY W GRAM STAIN

## 2023-03-11 DIAGNOSIS — E1122 Type 2 diabetes mellitus with diabetic chronic kidney disease: Secondary | ICD-10-CM | POA: Diagnosis not present

## 2023-03-11 DIAGNOSIS — N186 End stage renal disease: Secondary | ICD-10-CM | POA: Diagnosis not present

## 2023-03-11 DIAGNOSIS — Z992 Dependence on renal dialysis: Secondary | ICD-10-CM | POA: Diagnosis not present

## 2023-03-11 IMAGING — US US RENAL
1 series · 14 of 25 positions shown · non-contrast
Comparison: None.

CLINICAL DATA: Renal dysfunction

EXAM:
RENAL / URINARY TRACT ULTRASOUND COMPLETE

[Series 1001: renal us · 14 of 44 slices shown]
[im 1/44]
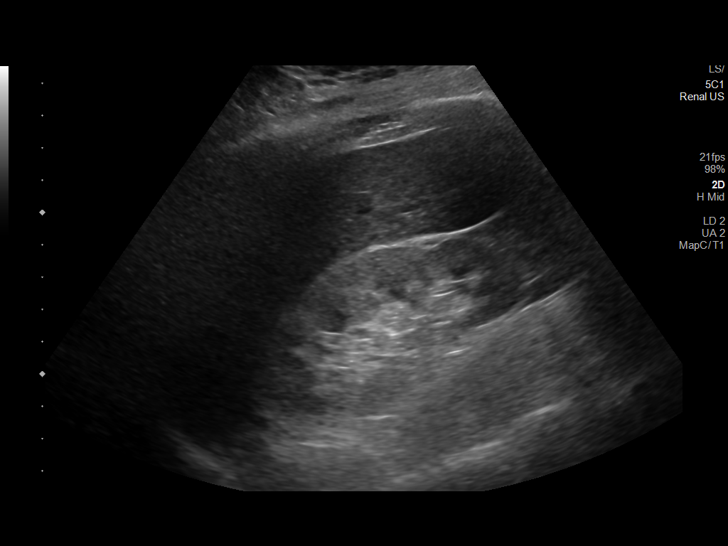
[im 4/44]
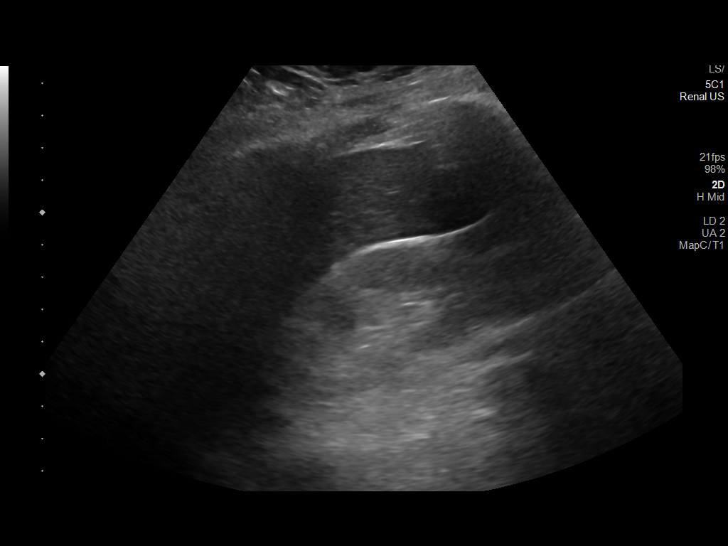
[im 8/44]
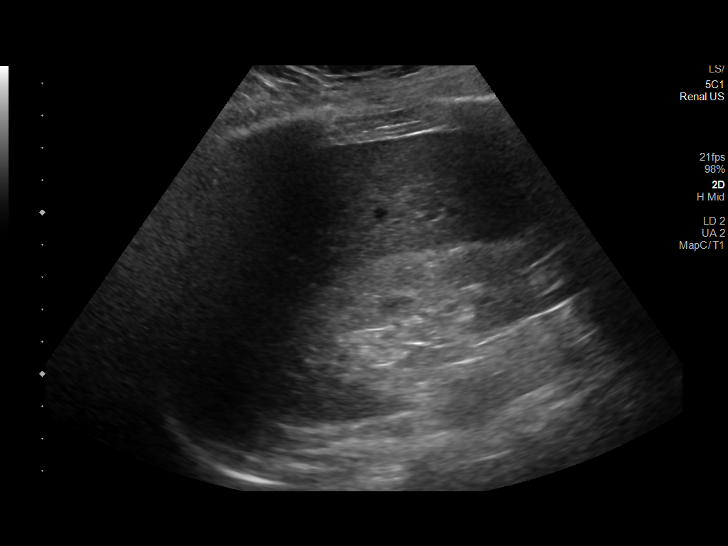
[im 11/44]
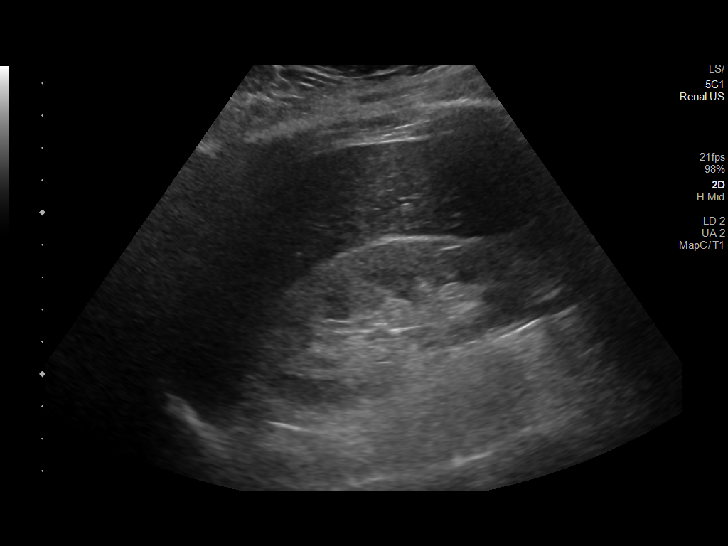
[im 15/44]
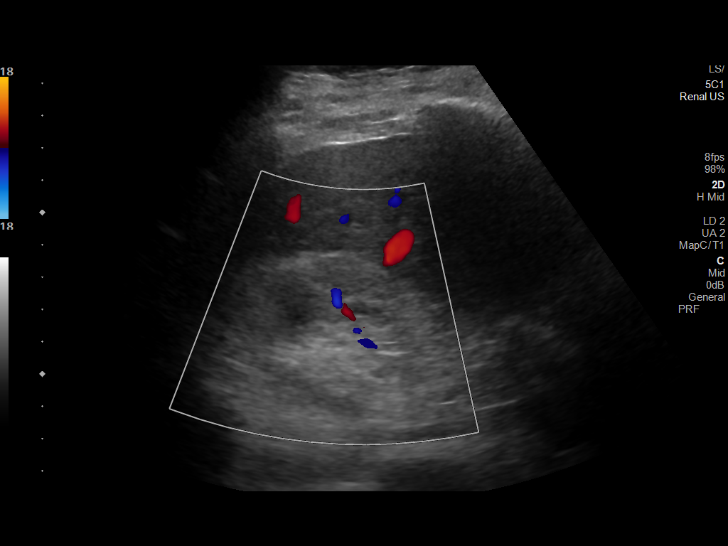
[im 17/44]
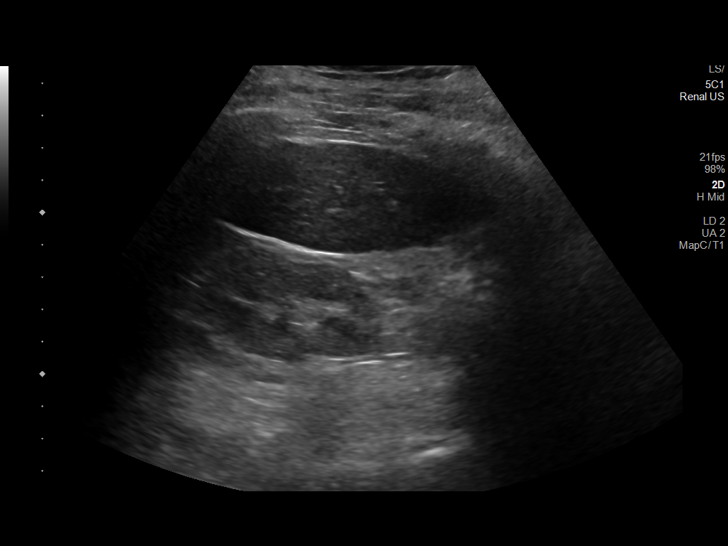
[im 20/44]
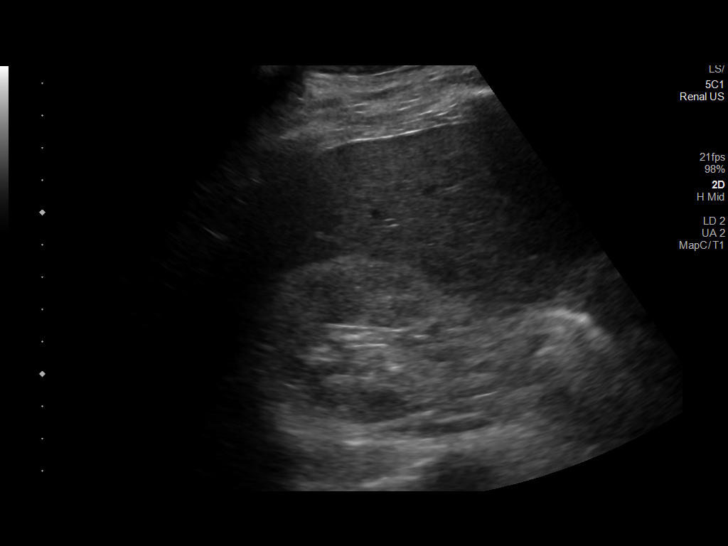
[im 24/44]
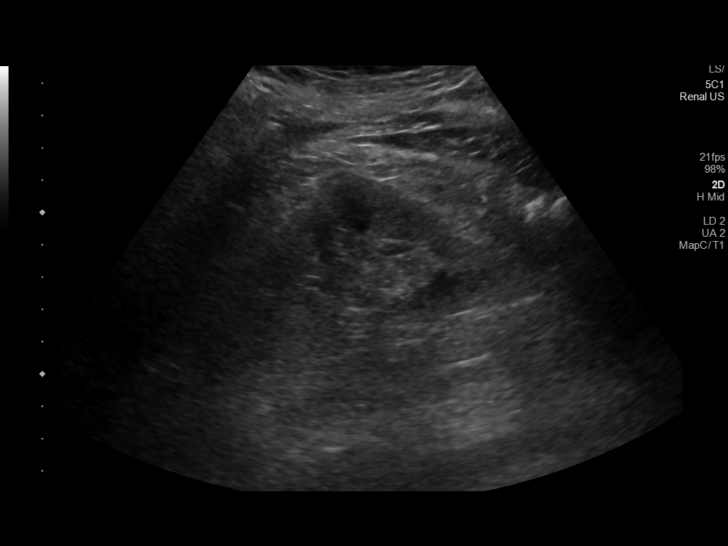
[im 27/44]
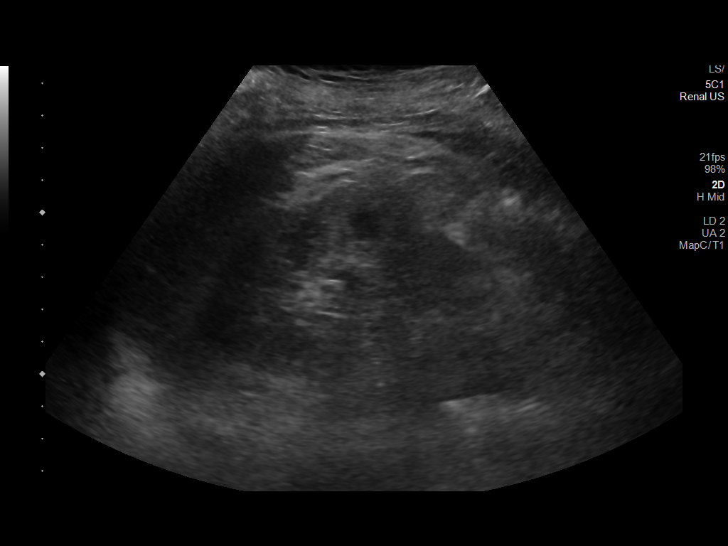
[im 29/44]
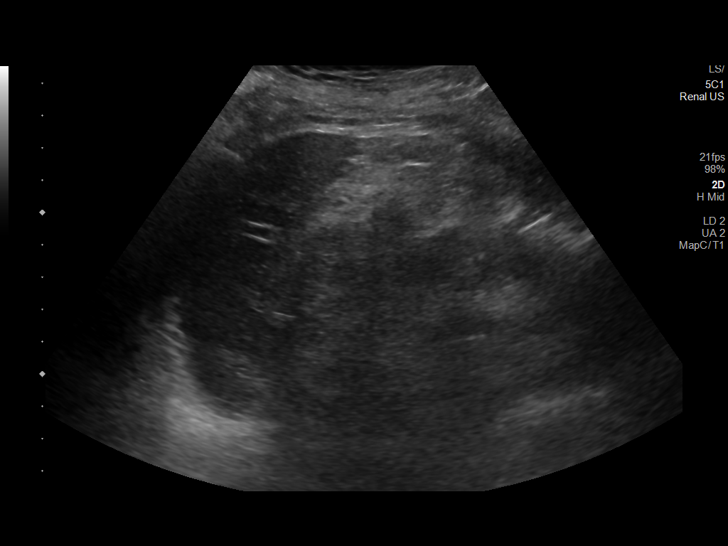
[im 33/44]
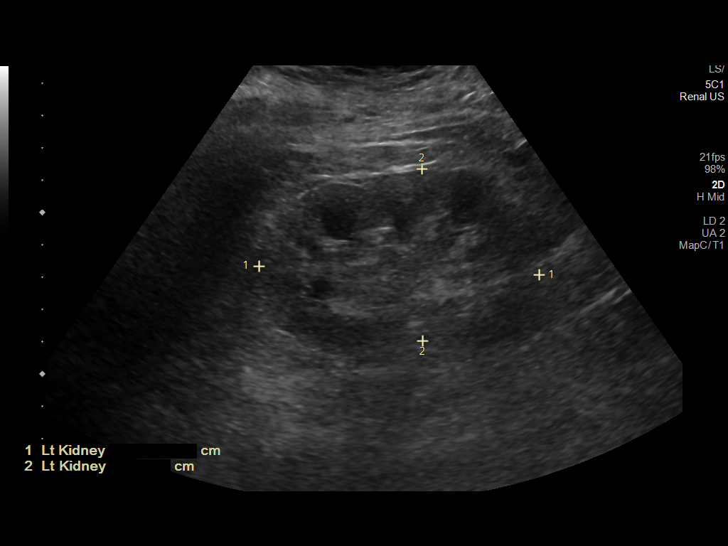
[im 36/44]
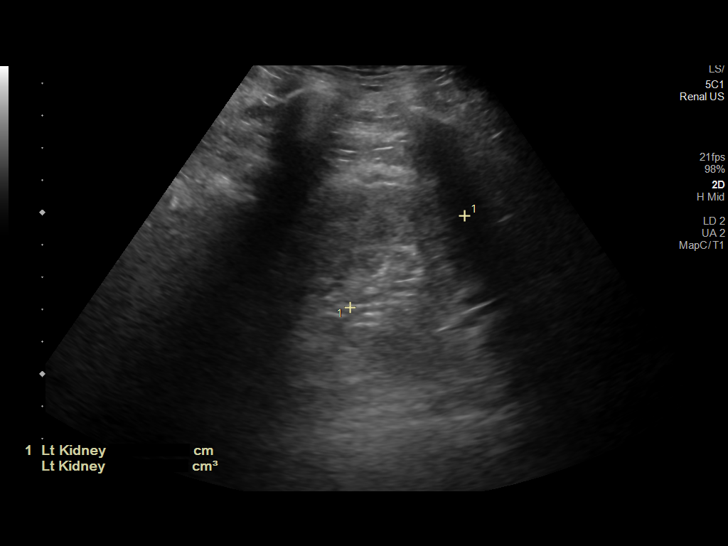
[im 40/44]
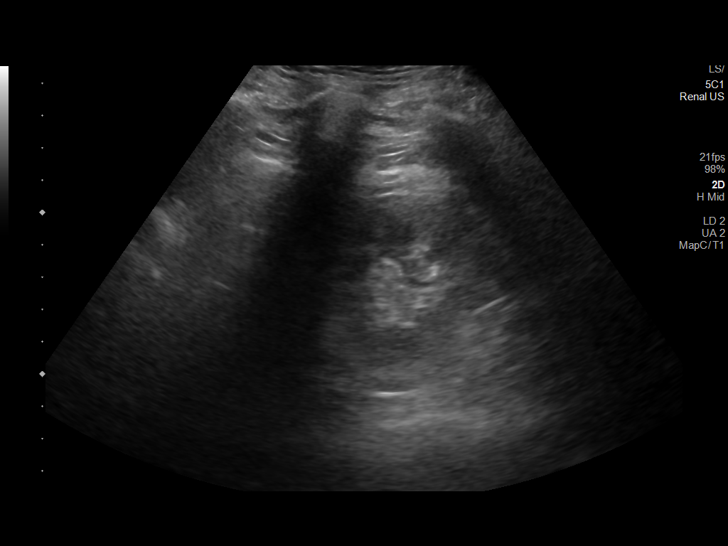
[im 44/44]
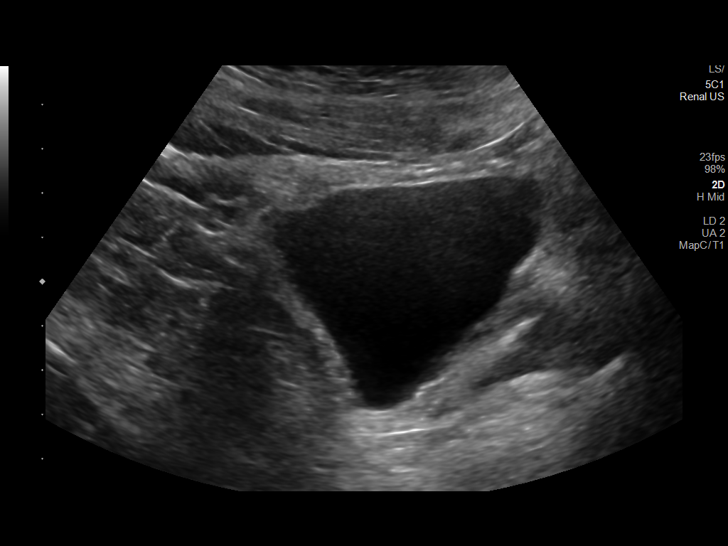

[14 of 25 positions shown; findings below may reference images not displayed]

FINDINGS: Right Kidney:

Renal measurements: 9 x 4.1 x 4.3 cm = volume: 83.57 mL. There is
increased cortical echogenicity. There is no hydronephrosis.

Left Kidney:

Renal measurements: 8.7 x 5.3 x 4.5 cm = volume: 109.98 mL. There is
increased cortical echogenicity. There is no hydronephrosis.

Bladder:

Appears normal for degree of bladder distention.

Other:

None.
IMPRESSION: There is no hydronephrosis. There is increased cortical echogenicity
in the kidneys suggesting possible medical renal disease.

## 2023-03-12 IMAGING — DX DG CHEST 1V PORT
1 series · 1 of 1 positions shown · non-contrast
Comparison: None.

CLINICAL DATA: 50-year-old male with shortness of breath.

EXAM:
PORTABLE CHEST 1 VIEW

[chest ap]
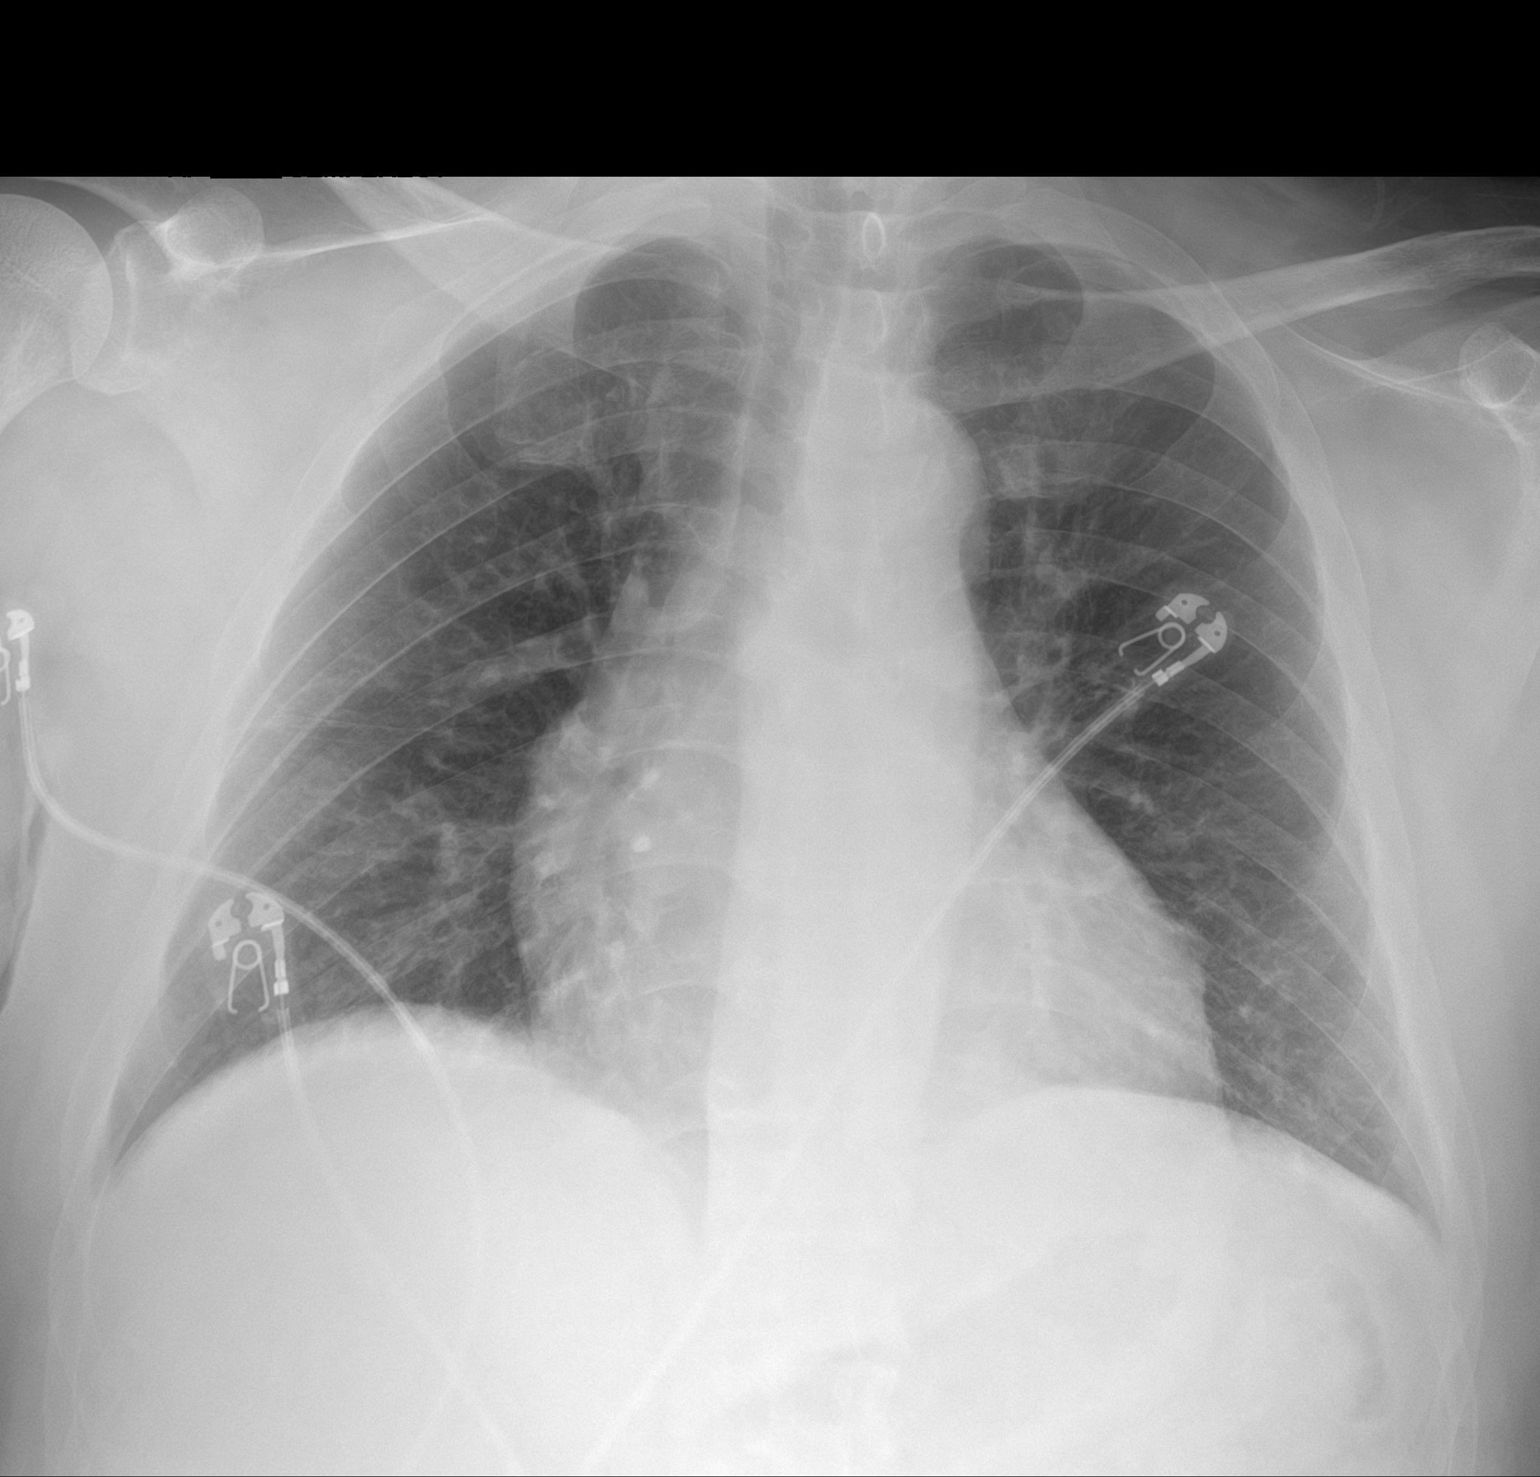

[1 of 1 positions shown; findings below may reference images not displayed]

FINDINGS: Portable AP semi upright view at 9458 hours. Mildly rotated to the
right. Heart size at the upper limits of normal. Somewhat low lung
volumes. Other mediastinal contours are within normal limits.
Visualized tracheal air column is within normal limits. Allowing for
portable technique the lungs are clear. No pneumothorax or pleural
effusion. No osseous abnormality identified. Negative visible bowel
gas.
IMPRESSION: No acute cardiopulmonary abnormality.

## 2023-03-12 IMAGING — US IR FLUORO GUIDE CV LINE*R*
1 series · 1 of 1 positions shown · non-contrast
Comparison: none

INDICATION: 50-year-old male with history of end-stage renal disease requiring
central venous access for initiation of hemodialysis.

[Series 1: ir (id) (id)/(id)/(id) ir · 1 of 1 slices shown]
[im 1/1]
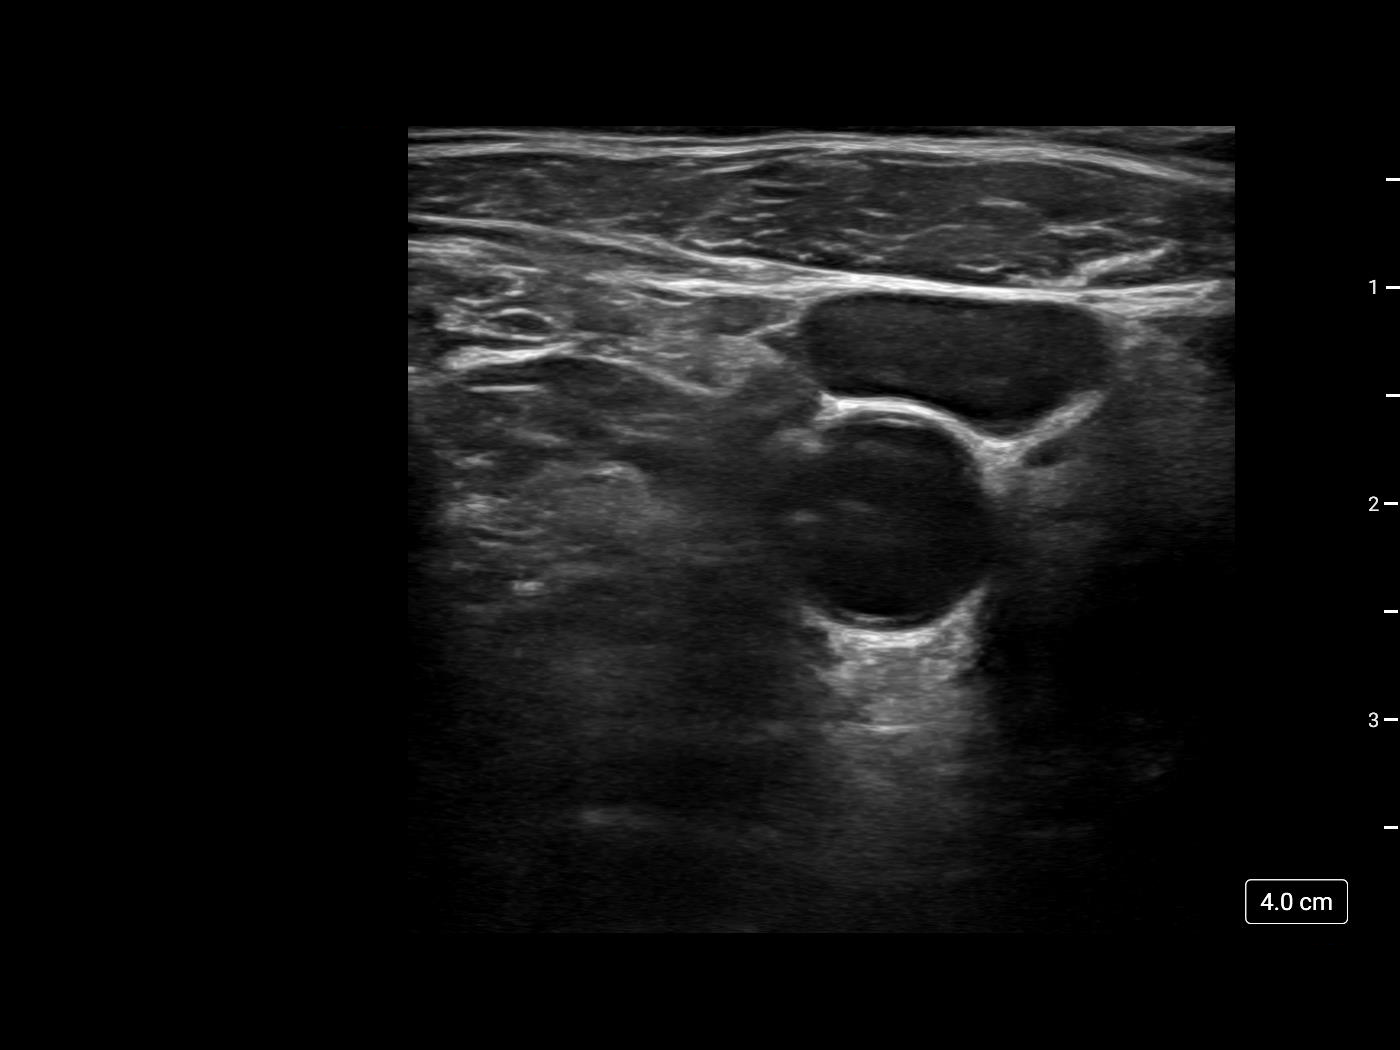

[1 of 1 positions shown; findings below may reference images not displayed]

EXAM:
TUNNELED CENTRAL VENOUS HEMODIALYSIS CATHETER PLACEMENT WITH
ULTRASOUND AND FLUOROSCOPIC GUIDANCE

MEDICATIONS:
Ancef 2 gm IV . The antibiotic was given in an appropriate time
interval prior to skin puncture.

ANESTHESIA/SEDATION:
Moderate (conscious) sedation was employed during this procedure. A
total of Versed 1 mg and Fentanyl 50 mcg was administered
intravenously.

Moderate Sedation Time: 13 minutes. The patient's level of
consciousness and vital signs were monitored continuously by
radiology nursing throughout the procedure under my direct
supervision.

FLUOROSCOPY TIME:  0 minutes 6 seconds (1 mGy).

COMPLICATIONS:
None immediate.



After creating a small venotomy incision, a 21 gauge micropuncture
kit was utilized to access the internal jugular vein. Real-time
ultrasound guidance was utilized for vascular access including the
acquisition of a permanent ultrasound image documenting patency of
the accessed vessel.

A Rosen wire was advanced to the level of the IVC and the
micropuncture sheath was exchanged for an 8 Fr dilator. A
French tunneled hemodialysis catheter measuring 19 cm from tip to
cuff was tunneled in a retrograde fashion from the anterior chest
wall to the venotomy incision. Serial dilation was then performed an
a peel-away sheath was placed.

The catheter was then placed through the peel-away sheath with the
catheter tip ultimately positioned within the right atrium. Final
catheter positioning was confirmed and documented with a spot
radiographic image. The catheter aspirates and flushes normally. The
catheter was flushed with appropriate volume heparin dwells.

The catheter exit site was secured with a 0-Silk retention suture.
The venotomy incision was closed with Dermabond. Sterile dressings
were applied. The patient tolerated the procedure well without
immediate post procedural complication.
IMPRESSION: Successful placement of 19 cm tip to cuff tunneled hemodialysis
catheter via the right internal jugular vein with catheter tip
terminating within the right atrium. The catheter is ready for
immediate use.

## 2023-03-12 NOTE — Progress Notes (Signed)
[facility] Progress Note  Subjective: remains on vent, and IV propofol and Cleviprex gtt's   Vitals:   03/07/2023 0800 02/15/2023 0809 03/10/2023 0900 03/05/2023 1147  BP: 132/61  138/62 (!) 137/59  Pulse: 71  70 73  Resp: 18  18 18   Temp: (!) 95.9 F (35.5 C)     TempSrc: Axillary     SpO2: 100% 100% 100% 100%  Weight:      Height:        Exam: Constitutional: Intubated, ill appearing Cardiovascular: Normal rate and regular rhythm.  Respiratory: Effort normal, non-labored breathing Neuro: sedated on then vent   OP HD: SW MWF 3h  76.1kg   400/800   2/2 bath  Hep 2000  LFA AVF - last HD 11/13, post 80.1kg - mircera 200 q2, last 5/6 - hectorol 2 mcg IV q HD   Assessment/Plan:  R large BG ICH - secondary to hypertension not thought to be from trauma. SP 3%  hypertonic therapy. Pt is followed by Neuro ICU team and neurosurgery. Poor prognosis.  ESRD - on HD MWF. HD today in ICU.  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 CCa a bit low, phos is pending.  Anemia esrd - Hb 10- 13 here. No esa needs. Transfuse prn.  Hypertension - goal SBP [phone] per neuro, getting Cleviprex gtt Volume - is 8kg up, looks swollen on exam. Max UF w/ HD today/tonight.  DM on SSI   Rob Sereen Schaff MD [facility] 02/15/2023, 12:29 PM  Recent Labs  Lab 02/16/2023 0115 02/18/2023 0120 02/23/2023 0255 02/27/2023 0053  HGB 11.3* 12.9* 11.9* 11.2*  ALBUMIN 3.5  --   --   --   CALCIUM 7.8*  --   --  8.7*  CREATININE 9.70* 10.90*  --  12.46*  K 4.8 4.8 4.2 6.2*   No results for input(s): "IRON", "TIBC", "FERRITIN" in the last 168 hours. Inpatient medications:  Chlorhexidine Gluconate Cloth  6 each Topical Q0600   Chlorhexidine Gluconate Cloth  6 each Topical Q0600   docusate  100 mg Per Tube BID   insulin aspart  0-15 Units Subcutaneous Q4H   mouth rinse  15 mL Mouth Rinse Q2H   pantoprazole (PROTONIX) IV  40 mg Intravenous QHS   polyethylene glycol  17 g Per Tube Daily   sennosides  5 mL Per  Tube BID    ampicillin-sulbactam (UNASYN) IV     clevidipine 12 mg/hr (03/11/2023 1046)   propofol (DIPRIVAN) infusion 5 mcg/kg/min (02/28/2023 0900)   acetaminophen **OR** acetaminophen (TYLENOL) oral liquid 160 mg/5 mL **OR** acetaminophen, fentaNYL (SUBLIMAZE) injection, fentaNYL (SUBLIMAZE) injection, ipratropium-albuterol, labetalol, mouth rinse

## 2023-03-12 NOTE — Progress Notes (Signed)
Family meeting with Dr. Roda Shutters and Ocie Bob from palliative and I Emogene Morgan RN. The family has decided to transition to comfort care. I notified the dialysis MD and nurse of the plan moving forward. Waiting for a few more family members to arrive and the family monk. The family understands we will not be escalating care in any way to prolong life for the benefit of extended family seeing patient before passing. Once the [facility] comes the family will be ready to extubate and begin comfort measures.   Emogene Morgan RN

## 2023-03-12 NOTE — Progress Notes (Signed)
SLP Cancellation Note  Patient Details Name: Randy Ross MRN: [id number] DOB: 11-Jan-1971   Cancelled treatment:        Pt intubated. Will follow along   Royce Macadamia 03/10/2023, 7:40 AM

## 2023-03-12 NOTE — TOC CM/SW Note (Signed)
Transition of Care [facility]) - Inpatient Brief Assessment   Patient Details  Name: Randy Ross MRN: [id number] Date of Birth: 1971/09/26  [facility]) CM/SW Contact:    Mearl Latin, LCSW Phone Number: 03/05/2023, 10:28 AM   Clinical Narrative: Patient from home with family support and outpatient dialysis. Patient with a recent SNF stay in January and then home health following that. TOC following for any potential needs.    Transition of Care Asessment: Insurance and Status: Insurance coverage has been reviewed Patient has primary care physician: Yes Home environment has been reviewed: From home Prior level of function:: Required assistance Prior/Current Home Services: No current home services Social Determinants of Health Reivew: SDOH reviewed no interventions necessary Readmission risk has been reviewed: Yes Transition of care needs: transition of care needs identified, TOC will continue to follow

## 2023-03-12 NOTE — Progress Notes (Addendum)
STROKE TEAM PROGRESS NOTE   INTERVAL HISTORY His family is at the bedside. Pt intubated not on sedation and not responding, right pupil larger than left, overnight had spike fever and this morning hypothermia. Pending family meeting for comfort care decision.   Vitals:   02/26/2023 1147 02/15/2023 1200 02/19/2023 1300 03/07/2023 1400  BP: (!) 137/59 136/64 (!) 146/62 128/61  Pulse: 73 70 78 100  Resp: 18 20 18 19   Temp:    97.7 F (36.5 C)  TempSrc:      SpO2: 100% 100% 100% 100%  Weight:      Height:       CBC:  Recent Labs  Lab 03/03/2023 0115 03/03/2023 0120 02/22/2023 0255 02/12/2023 0053  WBC 11.0*  --   --  20.4*  NEUTROABS 7.8*  --   --   --   HGB 11.3*   < > 11.9* 11.2*  HCT 36.4*   < > 35.0* 35.7*  MCV 68.0*  --   --  67.4*  PLT 354  --   --  238   < > = values in this interval not displayed.   Basic Metabolic Panel:  Recent Labs  Lab 02/11/2023 0115 02/26/2023 0120 02/15/2023 0255 02/19/2023 0430 02/23/2023 0053 03/04/2023 0707  NA 142 144 146*   < > 145 145  K 4.8 4.8 4.2  --  6.2*  --   CL 95* 99  --   --  100  --   CO2 30  --   --   --  25  --   GLUCOSE 120* 119*  --   --  185*  --   BUN 57* 54*  --   --  78*  --   CREATININE 9.70* 10.90*  --   --  12.46*  --   CALCIUM 7.8*  --   --   --  8.7*  --    < > = values in this interval not displayed.   Lipid Panel:  Recent Labs  Lab 02/11/2023 0054  CHOL 157  TRIG 139  HDL 52  CHOLHDL 3.0  VLDL 28  LDLCALC 77   HgbA1c:  Recent Labs  Lab 02/09/2023 0115  HGBA1C 5.3   Urine Drug Screen:  Recent Labs  Lab 03/03/2023 0313  LABOPIA NONE DETECTED  COCAINSCRNUR NONE DETECTED  LABBENZ NONE DETECTED  AMPHETMU NONE DETECTED  THCU NONE DETECTED  LABBARB NONE DETECTED    Alcohol Level  Recent Labs  Lab 02/21/2023 0115  ETH <10    IMAGING past 24 hours No results found.  PHYSICAL EXAM  Temp:  [95.9 F (35.5 C)-101.4 F (38.6 C)] 98.4 F (36.9 C) (05/29 1600) Pulse Rate:  [69-111] 78 (05/29 1600) Resp:  [14-28] 18  (05/29 1600) BP: (128-151)/(53-73) 141/55 (05/29 1600) SpO2:  [99 %-100 %] 100 % (05/29 1600) FiO2 (%):  [40 %] 40 % (05/29 1147) Weight:  [84 kg] 84 kg (05/29 0910)  General - Well nourished, well developed, intubated not on sedation.  Ophthalmologic - fundi not visualized due to noncooperation.  Cardiovascular - Regular rate and rhythm.  Neuro - intubated off sedation, eyes closed, not following commands. With forced eye opening, eyes in mid position, not blinking to visual threat, doll's eyes absent, not tracking, right pupil 2.43mm, left pupil 2mm, not reactive to light. Corneal reflex absent, gag and cough weakly present. Breathing over the vent.  Facial symmetry not able to test due to ET tube.  Tongue protrusion not cooperative.  On pain stimulation, no movement of all extremities. Sensation, coordination and gait not tested.     ASSESSMENT/PLAN [name] is a 52 y.o. 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   ICH:  Right large BG ICH and IVH, etiology likely due to uncontrolled HTN  Code Stroke CT head - 5.3 cm intraparenchymal hemorrhage, likely centered in the right basal ganglia, extending into the right frontal and temporal lobes, with an approximate volume of 52 mL. 4 mm of right-to-left midline shift  Repeat CT Head- Worsening ICH in the right hemisphere measuring ~100 cc. Contributing edema with progressive midline shift of 12 mm. 2D Echo EF 70-75% LDL 91 HgbA1c 5.9 VTE prophylaxis - SCDs No antithrombotic prior to admission, now on No antithrombotic.  Therapy recommendations:  Pending  Disposition:  Pending family meeting for comfort care decision   Cerebral edema CT head showed midline shift 9 mm and  progressed to 12 mm HTS - on hold due to fluid status and potential comfort care measures Na 146 NSGY consult- not a surgical candidate  Hypoxic respiratory failure  Aspiration  Intubated in the ED after aspiration event  PCCM managing vent   Hx stroke/ICH 09/2022-admitted for headache and slurred speech.  CT showed right cerebellar small ICH, repeat CT stable hematoma.  CTA head and neck no AVM or aneurysm.  EF 50 to 55%.  EEG no seizure.  LDL 91, A1c 5.9.  MRI also showed multiple bilateral embolic infarcts.   Hypertension Home meds:  Coreg, hydralazine, nifedipine Unstable BP goal 130-150 Cleviprex IVP Labetalol as needed  Other Stroke Risk Factors   Other Active Problems End stage renal disease on HD, next HD planned for 5/29 Chronic anemia due to [facility]  Hospital day # 1   Marvel Plan, MD PhD Stroke Neurology 02/16/2023 5:10 PM  This patient is critically ill due to large right BG ICH, IVH, cerebral edema, respiratory failure and at significant risk of neurological worsening, death form brain herniation, brain death. This patient's care requires constant monitoring of vital signs, hemodynamics, respiratory and cardiac monitoring, review of multiple databases, neurological assessment, discussion with family, other specialists and medical decision making of high complexity. I spent 45 minutes of neurocritical care time in the care of this patient.  To contact Stroke Continuity provider, please refer to [email or web address]. After hours, contact General Neurology

## 2023-03-12 NOTE — Progress Notes (Signed)
Pt compassionately extubated per family request and MD order.

## 2023-03-12 NOTE — Progress Notes (Signed)
Daily Progress Note   Patient Name: Randy Ross       Date: 03/08/2023 DOB: 1971/07/20  Age: 52 y.o. MRN#: [id number] Attending Physician: Stroke, Md, MD Primary Care Physician: Medicine, [facility] Family Admit Date: 02/12/2023  Reason for Consultation/Follow-up: Establishing goals of care  Patient Profile/HPI:  52 y.o. male  with past medical history of ESRD on HD, hypertensive brain hemorrhage November, 2023, DM, other infarcts, HTN  admitted on 03/01/2023 with large ICH that is progressing. He is not a candidate for surgical intervention. He vomited and likely aspirated, is intubated, sedated with propofol. Surgery recommends comfort measures only. Palliative medicine consulted for assistance with family medical decision making.    Subjective: Chart reviewed including labs, progress notes, imaging from this and previous encounters. Noted patient status worsening.  Meeting held with patient's family, patient's RN Selena Batten, and Dr. Roda Shutters. Chad interpreter used for patient's mother. Dr. Roda Shutters gave medical update.  Discussed recommendation for transition to comfort measures only.  Discussed transition to comfort measures only which includes stopping ventilator, stopping IV fluids, antibiotics, labs and providing symptom management for SOB, anxiety, nausea, vomiting, and other symptoms of dying. Family agrees with plan for transition to comfort measures.  They are Buddhist and requested time for monks to come and chant for patient, and make transition in care after.    Review of Systems  Unable to perform ROS: Intubated     Physical Exam Vitals and nursing note reviewed.  Constitutional:      Appearance: He is ill-appearing.  Cardiovascular:     Comments:  intubated Neurological:     Comments: unresponsive             Vital Signs: BP 128/61   Pulse 100   Temp 97.7 F (36.5 C)   Resp 19   Ht 5\' 5"  (1.651 m)   Wt 84 kg   SpO2 100%   BMI 30.82 kg/m  SpO2: SpO2: 100 % O2 Device: O2 Device: Ventilator O2 Flow Rate:    Intake/output summary:  Intake/Output Summary (Last 24 hours) at 02/25/2023 1547 Last data filed at 03/07/2023 1500 Gross per 24 hour  Intake 882.93 ml  Output 50 ml  Net 832.93 ml   LBM: Last BM Date :  (PTA) Baseline Weight: Weight: 84 kg Most recent weight: Weight: 84  kg       Palliative Assessment/Data: PPS: 10%      Patient Active Problem List   Diagnosis Date Noted   Nontraumatic thalamic hemorrhage (HCC) 02/18/2023   Palliative care by specialist 09/16/2022   Cerebral edema (HCC) 09/12/2022   Acute embolic stroke (HCC) 09/12/2022   Lower GI bleeding 09/12/2022   Dysphagia 09/12/2022   Gastrointestinal hemorrhage 09/10/2022   Acute metabolic encephalopathy and delirium 09/10/2022   Pressure injury of skin 09/01/2022   Acute respiratory failure with hypoxia (HCC) 08/25/2022   Hypertensive emergency 08/25/2022   Encounter for continuous renal replacement therapy (CRRT) for acute renal failure (HCC) 08/25/2022   Acute cerebellar hemorrhage (HCC) 08/25/2022   ESRD (end stage renal disease) (HCC) 12/15/2021   Essential hypertension 12/15/2021   Diabetes mellitus (HCC) 12/15/2021   CKD (chronic kidney disease), stage IV (HCC) 12/08/2021   AKI (acute kidney injury) (HCC) 12/08/2021   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   Thank you for allowing the Palliative Medicine Team to assist in the care of this patient.  Total time:  80 minutes Prolonged billing:   Greater than 50%  of this time was spent counseling and coordinating care related to the above assessment and plan.  Ocie Bob, AGNP-C Palliative Medicine   Please contact Palliative Medicine Team phone at [phone] for questions and concerns.

## 2023-03-12 NOTE — Death Summary Note (Signed)
DEATH SUMMARY   Patient Details  Name: Randy Ross MRN: [id number] DOB: 03/28/1971  Admission/Discharge Information   Admit Date:  March 21, 2023  Date of Death: Date of Death: 03/22/2023  Time of Death: Time of Death: 25-Mar-1652  Length of Stay: 1  Referring Physician: Medicine, [facility]   Reason(s) for Hospitalization  ICH  Diagnoses  Preliminary cause of death:  ICH: Right large BG ICH and IVH, etiology likely due to uncontrolled HTN  Cerebral edema  Secondary Diagnoses (including complications and co-morbidities):  Respiratory failure Aspiration History of intracranial hemorrhage Hypertensive emergency ESRD on hemodialysis Chronic anemia   Brief Hospital Course (including significant findings, care, treatment, and services provided and events leading to death)  Randy Ross is a 52 y.o. 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    ICH:  Right large BG ICH and IVH, etiology likely due to uncontrolled HTN  Code Stroke CT head - 5.3 cm intraparenchymal hemorrhage, likely centered in the right basal ganglia, extending into the right frontal and temporal lobes, with an approximate volume of 52 mL. 4 mm of right-to-left midline shift  Repeat CT Head- Worsening ICH in the right hemisphere measuring ~100 cc. Contributing edema with progressive midline shift of 12 mm. 2D Echo EF 70-75% LDL 91 HgbA1c 5.9 No antithrombotic prior to admission, now on No antithrombotic.  Disposition: Family decided comfort care measures, TOD 1653 on 2023-03-22   Cerebral edema CT head showed midline shift 9 mm and progressed to 12 mm Na 146 NSGY consult- not a surgical candidate   Hypoxic respiratory failure   Aspiration  Intubated in the ED after aspiration event    Hx stroke/ICH 09/2022-admitted for headache and slurred speech.  CT showed right cerebellar small ICH, repeat CT stable hematoma.  CTA head and neck no AVM or aneurysm.  EF 50 to 55%.  EEG no seizure.  LDL 91, A1c 5.9.  MRI also showed multiple bilateral embolic infarcts.    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 Exam Location:  Inpatient Procedure: 2D Echo, Cardiac Doppler and Color Doppler Indications:    Stroke I63.9  History:  Patient has prior history of Echocardiogram examinations, most                 recent 08/25/2022. Stroke; Risk Factors:Current Smoker, Diabetes                 and Hypertension.  Sonographer:    Aron Baba Referring Phys: [id number] ASHISH ARORA  Sonographer Comments: Echo performed with patient supine and on artificial respirator. IMPRESSIONS  1. Left ventricular ejection fraction, by estimation, is 70 to 75%. The left ventricle has hyperdynamic function. The left ventricle has no regional wall motion abnormalities. There is mild concentric left ventricular hypertrophy. Indeterminate diastolic filling due to E-A fusion.  2. Right ventricular systolic function is normal. The right ventricular size is normal. There is normal pulmonary  artery systolic pressure. The estimated right ventricular systolic pressure is 12.6 mmHg.  3. The mitral valve is grossly normal. No evidence of mitral valve regurgitation. No evidence of mitral stenosis.  4. The aortic valve is tricuspid. Aortic valve regurgitation is not visualized. No aortic stenosis is present.  5. The inferior vena cava is normal in size with greater than 50% respiratory variability, suggesting right atrial pressure of 3 mmHg. Comparison(s): Changes from prior study are noted. LV function now hyperdynamic. Conclusion(s)/Recommendation(s): No intracardiac source of embolism detected on this transthoracic study. Consider a transesophageal echocardiogram to exclude cardiac source of embolism if clinically indicated. FINDINGS  Left Ventricle: Left ventricular ejection fraction, by estimation, is 70 to 75%. The left ventricle has hyperdynamic function. The left ventricle has no regional wall motion abnormalities. The left ventricular internal cavity size was normal in size. There is mild concentric left ventricular hypertrophy. Indeterminate diastolic filling due to E-A fusion. Right Ventricle: The right ventricular size is normal. No increase in right ventricular wall thickness. Right ventricular systolic function is normal. There is normal pulmonary artery systolic pressure. The tricuspid regurgitant velocity is 1.55 m/s, and  with an assumed right atrial pressure of 3 mmHg, the estimated right ventricular systolic pressure is 12.6 mmHg. Left Atrium: Left atrial size was normal in size. Right Atrium: Right atrial size was normal in size. Pericardium: Trivial pericardial effusion is present. Mitral Valve: The mitral valve is grossly normal. No evidence of mitral valve regurgitation. No evidence of mitral valve stenosis. Tricuspid Valve: The tricuspid valve is grossly normal. Tricuspid valve regurgitation is trivial. No evidence of tricuspid stenosis. Aortic Valve: The aortic valve is tricuspid.  Aortic valve regurgitation is not visualized. No aortic stenosis is present. Aortic valve mean gradient measures 8.7 mmHg. Aortic valve peak gradient measures 17.2 mmHg. Aortic valve area, by VTI measures 3.82  cm. Pulmonic Valve: The pulmonic valve was grossly normal. Pulmonic valve regurgitation is not visualized. No evidence of pulmonic stenosis. Aorta: The aortic root and ascending aorta are structurally normal, with no evidence of dilitation. Venous: The inferior vena cava is normal in size with greater than 50% respiratory variability, suggesting right atrial pressure of 3 mmHg. IAS/Shunts: The atrial septum is grossly normal.  LEFT VENTRICLE PLAX 2D LVIDd:         5.00 cm   Diastology LVIDs:         2.90 cm   LV e' medial:    5.35 cm/s LV PW:         1.20 cm   LV E/e' medial:  17.5 LV IVS:        1.30 cm   LV e' lateral:   12.60 cm/s LVOT diam:  2.30 cm   LV E/e' lateral: 7.4 LV SV:         115 LV SV Index:   60 LVOT Area:     4.15 cm  RIGHT VENTRICLE RV S prime:     19.00 cm/s TAPSE (M-mode): 1.6 cm LEFT ATRIUM             Index        RIGHT ATRIUM           Index LA diam:        4.00 cm 2.09 cm/m   RA Area:     15.30 cm LA Vol (A2C):   73.5 ml 38.39 ml/m  RA Volume:   34.60 ml  18.07 ml/m LA Vol (A4C):   58.5 ml 30.56 ml/m LA Biplane Vol: 65.9 ml 34.42 ml/m  AORTIC VALVE                     PULMONIC VALVE AV Area (Vmax):    3.63 cm      PV Vmax:       1.49 m/s AV Area (Vmean):   3.90 cm      PV Peak grad:  8.9 mmHg AV Area (VTI):     3.82 cm AV Vmax:           207.33 cm/s AV Vmean:          132.200 cm/s AV VTI:            0.301 m AV Peak Grad:      17.2 mmHg AV Mean Grad:      8.7 mmHg LVOT Vmax:         181.00 cm/s LVOT Vmean:        124.000 cm/s LVOT VTI:          0.277 m LVOT/AV VTI ratio: 0.92  AORTA Ao Root diam: 3.70 cm Ao Asc diam:  3.50 cm MITRAL VALVE                TRICUSPID VALVE MV Area (PHT): 5.75 cm     TR Peak grad:   9.6 mmHg MV Decel Time: 132 msec     TR Vmax:        155.00  cm/s MR Peak grad: 7.0 mmHg MR Vmax:      132.00 cm/s   SHUNTS MV E velocity: 93.80 cm/s   Systemic VTI:  0.28 m MV A velocity: 142.00 cm/s  Systemic Diam: 2.30 cm MV E/A ratio:  0.66 Lennie Odor MD Electronically signed by Lennie Odor MD Signature Date/Time: 02/19/2023/3:52:35 PM    Final    CT HEAD WO CONTRAST  Result Date: 02/15/2023 CLINICAL DATA:  Stroke follow-up EXAM: CT HEAD WITHOUT CONTRAST TECHNIQUE: Contiguous axial images were obtained from the base of the skull through the vertex without intravenous contrast. RADIATION DOSE REDUCTION: This exam was performed according to the departmental dose-optimization program which includes automated exposure control, adjustment of the mA and/or kV according to patient size and/or use of iterative reconstruction technique. COMPARISON:  Head CT from earlier today FINDINGS: Brain: Progressive size and extent of ICH centered in the deep right cerebral white matter with extension into the anterior temporal white matter and basal ganglia. The hemorrhage measures 100 cc in volume (5 x 7 x 5.5 cm) with local swelling contributing to progressive leftward mass effect of 12 mm. Progressive dilatation of the temporal horn of the left lateral ventricle. Increase in intraventricular blood clot which reaches the  lower fourth ventricle. Effacement of extra-axial CSF spaces since prior. No interval infarct detected. There is chronic white matter disease. Dr. Wilford Corner is aware of the progression. Vascular: No hyperdense vessel or unexpected calcification. Skull: Normal. Negative for fracture or focal lesion. Sinuses/Orbits: No acute finding. IMPRESSION: 1. Worsening ICH in the right hemisphere measuring ~100 cc. Contributing edema with progressive midline shift of 12 mm. 2. Progressive intraventricular blood clot. Some degree of left lateral ventricular entrapment. Electronically Signed   By: Tiburcio Pea M.D.   On: 03/07/2023 06:17   DG Chest Port 1 View  Result Date:  02/14/2023 CLINICAL DATA:  Evaluate endotracheal tube placement. EXAM: PORTABLE CHEST 1 VIEW COMPARISON:  January 14, 2023 FINDINGS: Endotracheal tube is seen with its distal tip approximately 2.8 cm from the carina. An enteric tube is noted with its distal end looped within the body of the stomach. The cardiac silhouette is mildly enlarged and unchanged in size. Mild areas of bilateral suprahilar and bilateral infrahilar atelectasis and/or infiltrate are noted. No pleural effusion or pneumothorax is identified. The visualized skeletal structures are unremarkable. IMPRESSION: 1. Endotracheal tube and enteric tube positioning, as described above. 2. Mild bilateral suprahilar and bilateral infrahilar atelectasis and/or infiltrate. Electronically Signed   By: Aram Candela M.D.   On: 03/06/2023 02:32   CT HEAD CODE STROKE WO CONTRAST  Result Date: 03/04/2023 CLINICAL DATA:  Code stroke. EXAM: CT HEAD WITHOUT CONTRAST TECHNIQUE: Contiguous axial images were obtained from the base of the skull through the vertex without intravenous contrast. RADIATION DOSE REDUCTION: This exam was performed according to the departmental dose-optimization program which includes automated exposure control, adjustment of the mA and/or kV according to patient size and/or use of iterative reconstruction technique. COMPARISON:  01/14/2023 FINDINGS: Evaluation is significantly limited by motion. Brain: Hyperdense hemorrhage, likely centered in the right basal ganglia, extending into the right frontal and temporal lobes, measuring up to 5.3 x 3.9 x 5.0 cm (AP x TR x CC) (series 4, image 32 and series 5, image 21), with an approximate volume of 52 mL. Surrounding hypodensity, likely edema, with mass effect on the right lateral and third ventricles. Possible interval enlargement of the left lateral ventricle, concerning for entrapment. Hemorrhage extends into right lateral ventricle and possibly third ventricle. Approximately 4 mm of  right-to-left midline shift, although this is likely underestimated due to motion. No definite acute infarct.  No evidence of mass. Vascular: No hyperdense vessel. Skull: Motion limited but no definite fracture or focal lesion. Sinuses/Orbits: No acute finding. Other: None. IMPRESSION: 1. Evaluation is significantly limited by motion. Within this limitation, there is a 5.3 cm intraparenchymal hemorrhage, likely centered in the right basal ganglia, extending into the right frontal and temporal lobes, with an approximate volume of 52 mL. Surrounding edema with mass effect on the right lateral and third ventricles. Possible interval enlargement of the left lateral ventricle, concerning for entrapment. 2. Approximately 4 mm of right-to-left midline shift, although this is likely underestimated due to motion. Imaging results were communicated on 02/20/2023 at 1:40 am to provider Dr. Wilford Corner via secure text paging. Electronically Signed   By: Wiliam Ke M.D.   On: 02/20/2023 01:41    Microbiology Recent Results (from the past 240 hour(s))  MRSA Next Gen by PCR, Nasal     Status: None   Collection Time: 03/08/23  3:12 AM   Specimen: Tracheal Aspirate; Nasal Swab  Result Value Ref Range Status   MRSA by PCR Next Gen NOT DETECTED NOT  DETECTED Final    Comment: (NOTE) The GeneXpert MRSA Assay (FDA approved for NASAL specimens only), is one component of a comprehensive MRSA colonization surveillance program. It is not intended to diagnose MRSA infection nor to guide or monitor treatment for MRSA infections. Test performance is not FDA approved in patients less than 85 years old. Performed at [facility], [address], [place], [place] [address]   Culture, Respiratory w Gram Stain     Status: None (Preliminary result)   Collection Time: 02/18/2023  3:25 AM   Specimen: Tracheal Aspirate; Respiratory  Result Value Ref Range Status   Specimen Description TRACHEAL ASPIRATE  Final   Special Requests  NONE  Final   Gram Stain   Final    FEW WBC PRESENT,BOTH PMN AND MONONUCLEAR GRAM POSITIVE COCCI IN PAIRS AND CHAINS RARE GRAM VARIABLE ROD    Culture   Final    CULTURE REINCUBATED FOR BETTER GROWTH Performed at [facility], [address] [place], [place] [address]    Report Status PENDING  Incomplete    Lab Basic Metabolic Panel: Recent Labs  Lab 03/07/2023 0115 03/04/2023 0120 03/05/2023 0255 02/14/2023 0430 02/27/2023 0827 02/18/2023 1335 02/24/2023 1921 03/05/2023 0053 02/15/2023 0707  NA 142 144 146*   < > 148* 147* 147* 145 145  K 4.8 4.8 4.2  --   --   --   --  6.2*  --   CL 95* 99  --   --   --   --   --  100  --   CO2 30  --   --   --   --   --   --  25  --   GLUCOSE 120* 119*  --   --   --   --   --  185*  --   BUN 57* 54*  --   --   --   --   --  78*  --   CREATININE 9.70* 10.90*  --   --   --   --   --  12.46*  --   CALCIUM 7.8*  --   --   --   --   --   --  8.7*  --    < > = values in this interval not displayed.   Liver Function Tests: Recent Labs  Lab 02/26/2023 0115  AST 19  ALT 27  ALKPHOS 83  BILITOT 0.4  PROT 7.6  ALBUMIN 3.5   No results for input(s): "LIPASE", "AMYLASE" in the last 168 hours. No results for input(s): "AMMONIA" in the last 168 hours. CBC: Recent Labs  Lab 03/07/2023 0115 03/11/2023 0120 02/16/2023 0255 02/12/2023 0053  WBC 11.0*  --   --  20.4*  NEUTROABS 7.8*  --   --   --   HGB 11.3* 12.9* 11.9* 11.2*  HCT 36.4* 38.0* 35.0* 35.7*  MCV 68.0*  --   --  67.4*  PLT 354  --   --  238   Cardiac Enzymes: No results for input(s): "CKTOTAL", "CKMB", "CKMBINDEX", "TROPONINI" in the last 168 hours. Sepsis Labs: Recent Labs  Lab 02/11/2023 0115 03/04/2023 0053  WBC 11.0* 20.4*    Procedures/Operations  Intubation and extubation   Marvel Plan 03/10/2023, 5:16 PM

## 2023-03-12 NOTE — Progress Notes (Signed)
NAMEBradlyn Ross, MRN:  [id number], DOB:  [birth year], LOS: 1 ADMISSION DATE:  03/11/2023, CONSULTATION DATE:  03/02/2023 REFERRING MD:  Conan Bowens, MD CHIEF COMPLAINT:  Respiratory Failure/ ICH   History of Present Illness:  Randy Ross is a 52 year old male with ESRD on HD, hypertension, prior hypertensive cerebellar hemorrhage 08/14/22 and embolic or small vessel infarcts and DMII was brought in today via EMS for left-sided flaccid paralysis with normal mental status. Last known normal time was 9:30pm last night. Upon arrival, EMS noted sBP in the 230s. Code stroke was called and he was emergently evaluated by ED and Neurology teams. CT head showed 52cc intracerebral hemorrhage in the right basal ganglia extending into the right frontal and temporal lobes with mass effect on the right lateral and third ventricle. Approximately 4mm midline shift to left. Patient had large aspiration event while in CT scanner. He was emergently intubated.  Stroke team admitting, PCCM consulted for ventilator management.   Pertinent  Medical History   Past Medical History:  Diagnosis Date   Anemia    CKD (chronic kidney disease)    on wed 12/09/21   Diabetes mellitus without complication (HCC)    type 2   Hypertension    Renal disorder    Significant Hospital Events: Including procedures, antibiotic start and stop dates in addition to other pertinent events   5/28 admitted to ICU with ICH and respiratory failure - intubated.    Interim History / Subjective:  Repeat CTH 5/28 with worsening ICH, MLS, and new IVH/ clot with left lateral ventricle entrapment.  Not a surgical candidate per NSGY.  After GOC with family/ neurology, made DNR with plans likely transition to comfort and one-way extubation possibly today/ after family has had adequate time.   Objective   Blood pressure (!) 137/59, pulse 73, temperature (!) 95.9 F (35.5 C), temperature source Axillary, resp. rate 18, height 5\' 5"  (1.651  m), weight 84 kg, SpO2 100 %.    Vent Mode: PRVC FiO2 (%):  [40 %] 40 % Set Rate:  [18 bmp] 18 bmp Vt Set:  [490 mL] 490 mL PEEP:  [5 cmH20] 5 cmH20 Plateau Pressure:  [14 cmH20-18 cmH20] 17 cmH20   Intake/Output Summary (Last 24 hours) at 03/10/2023 1154 Last data filed at 03/01/2023 0900 Gross per 24 hour  Intake 915.58 ml  Output 0 ml  Net 915.58 ml   Filed Weights   02/26/2023 0100  Weight: 84 kg   Examination: General:  critically ill adult male in NAD, unresponsive HEENT: MM pink/moist, ETT/ OGT, R pupil 5/ nr, L pupils 3/ nr, absent corneal Neuro: unresponsive to noxious stimuli, no gag, +cough CV: rr, NSR, no murmur PULM:  MV supportive, clear, scant secretions GI: soft, bs hypoactive, ND  Extremities: warm/dry, no LE edema  Skin: no rashes Tmax 101.3 overnight > 95.9 this am Labs reviewed> K 6.2, BUN/ sCr 78/ 12.46  Resolved Hospital Problem list     Assessment & Plan:  Acute Intracerebral/ BG Hemorrhage with IVH  Cerebral Edema with compression of Brain - Neuro primary - Neurosurgery consulted, not a surgical candidate - continue supportive care and neuro protective measures - DNR.  Continuing current care till family gathered, anticipate transition to comfort care.    Acute Hypoxemic Respiratory Failure Aspiration Pneumonia -  Continue MV support, 4-8cc/kg IBW with goal Pplat <30 and DP<15  - VAP prevention protocol/ PPI - PAD protocol for sedation> propofol, prn fentanyl  - wean FiO2  as able for SpO2 >92%  - cont unasyn for now - anticipate one-way extubation when family ready   Hypertensive Emergency - cont cleviprex for SBP 130-159mmHg - PRN hydralazine and labetalol  ESRD on HD Hyperkalemia, mild - Nephrology following, deferring HD plans at this time given plan of care.   DMII - SSI  Anemia of Chronic Disease - trending CBC  Best Practice (right click and "Reselect all SmartList Selections" daily)   Diet/type: NPO DVT prophylaxis:  other> none with ICH GI prophylaxis: PPI Lines: N/A Foley:  N/A Code Status:  DNR Last date of multidisciplinary goals of care discussion [pending]  No family at bedside.  Coming around lunch. Pending 5/29.  Labs   CBC: Recent Labs  Lab 02/09/2023 0115 03/07/2023 0120 02/20/2023 0255 03/11/2023 0053  WBC 11.0*  --   --  20.4*  NEUTROABS 7.8*  --   --   --   HGB 11.3* 12.9* 11.9* 11.2*  HCT 36.4* 38.0* 35.0* 35.7*  MCV 68.0*  --   --  67.4*  PLT 354  --   --  238    Basic Metabolic Panel: Recent Labs  Lab 03/05/2023 0115 03/01/2023 0120 02/20/2023 0255 02/17/2023 0430 02/26/2023 0827 02/23/2023 1335 02/25/2023 1921 02/15/2023 0053 02/27/2023 0707  NA 142 144 146*   < > 148* 147* 147* 145 145  K 4.8 4.8 4.2  --   --   --   --  6.2*  --   CL 95* 99  --   --   --   --   --  100  --   CO2 30  --   --   --   --   --   --  25  --   GLUCOSE 120* 119*  --   --   --   --   --  185*  --   BUN 57* 54*  --   --   --   --   --  78*  --   CREATININE 9.70* 10.90*  --   --   --   --   --  12.46*  --   CALCIUM 7.8*  --   --   --   --   --   --  8.7*  --    < > = values in this interval not displayed.   GFR: Estimated Creatinine Clearance: 7 mL/min (A) (by C-G formula based on SCr of 12.46 mg/dL (H)). Recent Labs  Lab 03/06/2023 0115 02/18/2023 0053  WBC 11.0* 20.4*    Liver Function Tests: Recent Labs  Lab 02/11/2023 0115  AST 19  ALT 27  ALKPHOS 83  BILITOT 0.4  PROT 7.6  ALBUMIN 3.5   No results for input(s): "LIPASE", "AMYLASE" in the last 168 hours. No results for input(s): "AMMONIA" in the last 168 hours.  ABG    Component Value Date/Time   PHART 7.485 (H) 02/26/2023 0255   PCO2ART 45.6 02/15/2023 0255   PO2ART 260 (H) 02/26/2023 0255   HCO3 34.4 (H) 03/11/2023 0255   TCO2 36 (H) 02/16/2023 0255   O2SAT 100 03/11/2023 0255     Coagulation Profile: Recent Labs  Lab 03/08/23 0115  INR 0.9    Cardiac Enzymes: No results for input(s): "CKTOTAL", "CKMB", "CKMBINDEX",  "TROPONINI" in the last 168 hours.  HbA1C: Hgb A1c MFr Bld  Date/Time Value Ref Range Status  03/08/2023 01:15 AM 5.3 4.8 - 5.6 % Final    Comment:    (  NOTE)         Prediabetes: 5.7 - 6.4         Diabetes: >6.4         Glycemic control for adults with diabetes: <7.0   08/25/2022 02:31 AM 5.9 (H) 4.8 - 5.6 % Final    Comment:    (NOTE)         Prediabetes: 5.7 - 6.4         Diabetes: >6.4         Glycemic control for adults with diabetes: <7.0     CBG: Recent Labs  Lab 02/25/2023 1931 02/22/2023 2328 02/28/2023 0312 02/19/2023 0758 02/23/2023 1138  GLUCAP 136* 164* 159* 111* 204*    Critical care time: 30 minutes       Posey Boyer, MSN, AG-ACNP-BC [facility] 02/26/2023, 11:54 AM  See Amion for pager If no response to pager, please call PCCM consult pager After 7:00 pm call Elink

## 2023-03-12 DEATH — deceased
